# Patient Record
Sex: Male | Born: 1950 | Race: Black or African American | Hispanic: No | Smoking: Never smoker
Health system: Southern US, Community
[De-identification: ages and names within clinical notes are randomized; demographics above are authoritative.]

## PROBLEM LIST (undated history)

## (undated) DIAGNOSIS — I491 Atrial premature depolarization: Secondary | ICD-10-CM

## (undated) DIAGNOSIS — I5032 Chronic diastolic (congestive) heart failure: Secondary | ICD-10-CM

## (undated) DIAGNOSIS — I493 Ventricular premature depolarization: Secondary | ICD-10-CM

## (undated) DIAGNOSIS — I10 Essential (primary) hypertension: Secondary | ICD-10-CM

## (undated) DIAGNOSIS — I4729 Other ventricular tachycardia: Secondary | ICD-10-CM

## (undated) DIAGNOSIS — I471 Supraventricular tachycardia, unspecified: Secondary | ICD-10-CM

## (undated) DIAGNOSIS — I472 Ventricular tachycardia: Secondary | ICD-10-CM

## (undated) DIAGNOSIS — I169 Hypertensive crisis, unspecified: Secondary | ICD-10-CM

## (undated) DIAGNOSIS — J9610 Chronic respiratory failure, unspecified whether with hypoxia or hypercapnia: Secondary | ICD-10-CM

## (undated) DIAGNOSIS — R7989 Other specified abnormal findings of blood chemistry: Secondary | ICD-10-CM

## (undated) DIAGNOSIS — R778 Other specified abnormalities of plasma proteins: Secondary | ICD-10-CM

## (undated) DIAGNOSIS — E785 Hyperlipidemia, unspecified: Secondary | ICD-10-CM

## (undated) DIAGNOSIS — I422 Other hypertrophic cardiomyopathy: Secondary | ICD-10-CM

## (undated) DIAGNOSIS — H409 Unspecified glaucoma: Secondary | ICD-10-CM

## (undated) DIAGNOSIS — K5790 Diverticulosis of intestine, part unspecified, without perforation or abscess without bleeding: Secondary | ICD-10-CM

## (undated) DIAGNOSIS — R4182 Altered mental status, unspecified: Secondary | ICD-10-CM

## (undated) DIAGNOSIS — C20 Malignant neoplasm of rectum: Secondary | ICD-10-CM

## (undated) DIAGNOSIS — N183 Chronic kidney disease, stage 3 unspecified: Secondary | ICD-10-CM

## (undated) DIAGNOSIS — D649 Anemia, unspecified: Secondary | ICD-10-CM

## (undated) DIAGNOSIS — A048 Other specified bacterial intestinal infections: Secondary | ICD-10-CM

## (undated) DIAGNOSIS — G4733 Obstructive sleep apnea (adult) (pediatric): Secondary | ICD-10-CM

## (undated) HISTORY — DX: Diverticulosis of intestine, part unspecified, without perforation or abscess without bleeding: K57.90

## (undated) HISTORY — DX: Altered mental status, unspecified: R41.82

## (undated) HISTORY — DX: Other ventricular tachycardia: I47.29

## (undated) HISTORY — DX: Other specified bacterial intestinal infections: A04.8

## (undated) HISTORY — DX: Ventricular premature depolarization: I49.3

## (undated) HISTORY — DX: Chronic kidney disease, stage 3 (moderate): N18.3

## (undated) HISTORY — DX: Ventricular tachycardia: I47.2

## (undated) HISTORY — DX: Other hypertrophic cardiomyopathy: I42.2

## (undated) HISTORY — DX: Atrial premature depolarization: I49.1

## (undated) HISTORY — DX: Supraventricular tachycardia, unspecified: I47.10

## (undated) HISTORY — DX: Malignant neoplasm of rectum: C20

## (undated) HISTORY — DX: Unspecified glaucoma: H40.9

## (undated) HISTORY — PX: NO PAST SURGERIES: SHX2092

## (undated) HISTORY — DX: Hypertensive crisis, unspecified: I16.9

## (undated) HISTORY — DX: Obstructive sleep apnea (adult) (pediatric): G47.33

## (undated) HISTORY — DX: Chronic kidney disease, stage 3 unspecified: N18.30

## (undated) HISTORY — DX: Hyperlipidemia, unspecified: E78.5

## (undated) HISTORY — DX: Supraventricular tachycardia: I47.1

## (undated) HISTORY — DX: Essential (primary) hypertension: I10

## (undated) HISTORY — DX: Anemia, unspecified: D64.9

## (undated) HISTORY — DX: Chronic respiratory failure, unspecified whether with hypoxia or hypercapnia: J96.10

## (undated) NOTE — *Deleted (*Deleted)
Transition of Care [facility]) - CM/SW Discharge Note   Patient Details  Name: Robert Chaney MRN: [id number] Date of Birth: 20-Jul-1950  [facility]) CM/SW Contact:  Sharin Mons, RN Phone Number: 05/13/2020, 11:54 AM   Clinical Narrative:    Patient will DC to: home Anticipated DC date: 05/13/2020 Family notified: yes Transport by: car   Per MD patient ready for DC today . RN, patient and patient's family notified of DC. Discharge    RNCM will sign off for now as intervention is no longer needed. Please consult [place] again if new needs arise.    Final next level of care: Home/Self Care Barriers to Discharge: No Barriers Identified   Patient Goals and CMS Choice Patient states their goals for this hospitalization and ongoing recovery are:: to return home   Choice offered to / list presented to : NA  Discharge Placement                       Discharge Plan and Services In-house Referral: NA Discharge Planning Services: CM Consult Post Acute Care Choice: NA          DME Arranged: Oxygen (portable oxygen tank for transport to home) DME Agency: [employer] Date DME Agency Contacted: 05/13/20 Time DME Agency Contacted: 2000 Representative spoke with at DME Agency: [place]: RN [place] Agency: [place] Date [place]: 05/13/20 Time [place]: [place] Representative spoke with at [place]: [place] ([place]) Interventions     Readmission Risk Interventions Readmission Risk Prevention Plan 05/12/2020  Transportation Screening Complete  PCP or Specialist Appt within 3-5 Days Complete  HRI or [place] Complete  Social Work Consult for [place] Planning/Counseling [place] Screening Not Applicable  Medication Review Press photographer) Complete  Some recent data might be hidden

---

## 2009-04-16 ENCOUNTER — Ambulatory Visit: Payer: Self-pay | Admitting: Cardiology

## 2009-04-16 ENCOUNTER — Inpatient Hospital Stay (HOSPITAL_COMMUNITY): Admission: EM | Admit: 2009-04-16 | Discharge: 2009-04-20 | Payer: Self-pay | Admitting: Emergency Medicine

## 2009-04-17 ENCOUNTER — Encounter (INDEPENDENT_AMBULATORY_CARE_PROVIDER_SITE_OTHER): Payer: Self-pay | Admitting: Cardiology

## 2010-03-22 ENCOUNTER — Ambulatory Visit: Payer: Self-pay | Admitting: Cardiology

## 2010-07-24 ENCOUNTER — Ambulatory Visit: Payer: Self-pay | Admitting: Cardiology

## 2010-09-27 LAB — CBC
HCT: 28.5 % — ABNORMAL LOW (ref 39.0–52.0)
HCT: 30.2 % — ABNORMAL LOW (ref 39.0–52.0)
HCT: 31.1 % — ABNORMAL LOW (ref 39.0–52.0)
Hemoglobin: 10.3 g/dL — ABNORMAL LOW (ref 13.0–17.0)
Hemoglobin: 9.9 g/dL — ABNORMAL LOW (ref 13.0–17.0)
MCHC: 33.7 g/dL (ref 30.0–36.0)
MCHC: 33.8 g/dL (ref 30.0–36.0)
MCHC: 34.1 g/dL (ref 30.0–36.0)
MCHC: 34.7 g/dL (ref 30.0–36.0)
MCV: 95.1 fL (ref 78.0–100.0)
MCV: 95.8 fL (ref 78.0–100.0)
MCV: 95.8 fL (ref 78.0–100.0)
MCV: 95.9 fL (ref 78.0–100.0)
Platelets: 294 10*3/uL (ref 150–400)
RBC: 3 MIL/uL — ABNORMAL LOW (ref 4.22–5.81)
RBC: 3.15 MIL/uL — ABNORMAL LOW (ref 4.22–5.81)
RBC: 3.34 MIL/uL — ABNORMAL LOW (ref 4.22–5.81)
RDW: 14.4 % (ref 11.5–15.5)
WBC: 10.8 10*3/uL — ABNORMAL HIGH (ref 4.0–10.5)
WBC: 7.8 10*3/uL (ref 4.0–10.5)

## 2010-09-27 LAB — COMPREHENSIVE METABOLIC PANEL
BUN: 14 mg/dL (ref 6–23)
CO2: 26 mEq/L (ref 19–32)
Calcium: 8.2 mg/dL — ABNORMAL LOW (ref 8.4–10.5)
Chloride: 105 mEq/L (ref 96–112)
Creatinine, Ser: 1.2 mg/dL (ref 0.4–1.5)
GFR calc Af Amer: >60 mL/min (ref >60)
Glucose, Bld: 130 mg/dL — ABNORMAL HIGH (ref 70–99)
Potassium: 3.9 mEq/L (ref 3.5–5.1)
Sodium: 136 mEq/L (ref 135–145)
Total Protein: 6.8 g/dL (ref 6.0–8.3)

## 2010-09-27 LAB — CARDIAC PANEL(CRET KIN+CKTOT+MB+TROPI)
CK, MB: 4.2 ng/mL — ABNORMAL HIGH (ref 0.3–4.0)
Relative Index: 2.9 — ABNORMAL HIGH (ref 0.0–2.5)
Relative Index: 3.4 — ABNORMAL HIGH (ref 0.0–2.5)
Total CK: 126 U/L (ref 7–232)

## 2010-09-27 LAB — POCT I-STAT, CHEM 8
BUN: 16 mg/dL (ref 6–23)
Calcium, Ion: 1.07 mmol/L — ABNORMAL LOW (ref 1.12–1.32)
Potassium: 4 mEq/L (ref 3.5–5.1)
TCO2: 27 mmol/L (ref 0–100)

## 2010-09-27 LAB — DIFFERENTIAL
Basophils Absolute: 0 10*3/uL (ref 0.0–0.1)
Lymphs Abs: 0.9 10*3/uL (ref 0.7–4.0)
Neutro Abs: 9.5 10*3/uL — ABNORMAL HIGH (ref 1.7–7.7)
Neutrophils Relative %: 88 % — ABNORMAL HIGH (ref 43–77)

## 2010-09-27 LAB — GLUCOSE, CAPILLARY
Glucose-Capillary: 125 mg/dL — ABNORMAL HIGH (ref 70–99)
Glucose-Capillary: 128 mg/dL — ABNORMAL HIGH (ref 70–99)
Glucose-Capillary: 133 mg/dL — ABNORMAL HIGH (ref 70–99)
Glucose-Capillary: 138 mg/dL — ABNORMAL HIGH (ref 70–99)

## 2010-09-27 LAB — BASIC METABOLIC PANEL
BUN: 14 mg/dL (ref 6–23)
BUN: 20 mg/dL (ref 6–23)
CO2: 28 mEq/L (ref 19–32)
CO2: 28 mEq/L (ref 19–32)
CO2: 29 mEq/L (ref 19–32)
Calcium: 8.4 mg/dL (ref 8.4–10.5)
Calcium: 8.5 mg/dL (ref 8.4–10.5)
Calcium: 8.6 mg/dL (ref 8.4–10.5)
Chloride: 102 mEq/L (ref 96–112)
Chloride: 96 mEq/L (ref 96–112)
Creatinine, Ser: 1.51 mg/dL — ABNORMAL HIGH (ref 0.4–1.5)
GFR calc Af Amer: 53 mL/min — ABNORMAL LOW (ref >60)
GFR calc Af Amer: 57 mL/min — ABNORMAL LOW (ref >60)
GFR calc Af Amer: >60 mL/min (ref >60)
GFR calc non Af Amer: 56 mL/min — ABNORMAL LOW (ref >60)
Glucose, Bld: 129 mg/dL — ABNORMAL HIGH (ref 70–99)
Glucose, Bld: 138 mg/dL — ABNORMAL HIGH (ref 70–99)
Potassium: 3.7 mEq/L (ref 3.5–5.1)
Potassium: 3.9 mEq/L (ref 3.5–5.1)
Potassium: 4 mEq/L (ref 3.5–5.1)
Sodium: 137 mEq/L (ref 135–145)
Sodium: 138 mEq/L (ref 135–145)

## 2010-09-27 LAB — LIPID PANEL
HDL: 41 mg/dL (ref >39)
LDL Cholesterol: 124 mg/dL — ABNORMAL HIGH (ref 0–99)

## 2010-09-27 LAB — URINALYSIS, ROUTINE W REFLEX MICROSCOPIC
Leukocytes, UA: NEGATIVE
Nitrite: NEGATIVE
Protein, ur: 100 mg/dL — AB
pH: 7.5 (ref 5.0–8.0)

## 2010-09-27 LAB — IRON AND TIBC
Iron: 40 ug/dL — ABNORMAL LOW (ref 42–135)
Saturation Ratios: 14 % — ABNORMAL LOW (ref 20–55)
UIBC: 255 ug/dL

## 2010-09-27 LAB — PROTIME-INR
INR: 1.02 (ref 0.00–1.49)
Prothrombin Time: 13.3 seconds (ref 11.6–15.2)

## 2010-09-27 LAB — BRAIN NATRIURETIC PEPTIDE: Pro B Natriuretic peptide (BNP): 510 pg/mL — ABNORMAL HIGH (ref 0.0–100.0)

## 2010-09-27 LAB — RAPID URINE DRUG SCREEN, HOSP PERFORMED
Benzodiazepines: NOT DETECTED
Opiates: NOT DETECTED
Tetrahydrocannabinol: NOT DETECTED

## 2010-09-27 LAB — VITAMIN B12: Vitamin B-12: 389 pg/mL (ref 211–911)

## 2010-09-27 LAB — URINE MICROSCOPIC-ADD ON

## 2010-09-27 LAB — CK TOTAL AND CKMB (NOT AT ARMC): Relative Index: 3.5 — ABNORMAL HIGH (ref 0.0–2.5)

## 2010-09-27 LAB — HEMOGLOBIN A1C
Hgb A1c MFr Bld: 6.1 % (ref 4.6–6.1)
Mean Plasma Glucose: 128 mg/dL

## 2010-09-27 LAB — POCT CARDIAC MARKERS
CKMB, poc: 2.7 ng/mL (ref 1.0–8.0)
Troponin i, poc: 0.09 ng/mL (ref 0.00–0.09)

## 2010-09-27 LAB — APTT: aPTT: 29 seconds (ref 24–37)

## 2011-02-14 ENCOUNTER — Encounter: Payer: Self-pay | Admitting: Cardiology

## 2011-02-20 ENCOUNTER — Ambulatory Visit (INDEPENDENT_AMBULATORY_CARE_PROVIDER_SITE_OTHER): Payer: 59 | Admitting: Cardiology

## 2011-02-20 ENCOUNTER — Encounter: Payer: Self-pay | Admitting: Cardiology

## 2011-02-20 ENCOUNTER — Other Ambulatory Visit: Payer: Self-pay | Admitting: *Deleted

## 2011-02-20 VITALS — BP 150/90 | HR 80 | Wt 198.0 lb

## 2011-02-20 DIAGNOSIS — E7800 Pure hypercholesterolemia, unspecified: Secondary | ICD-10-CM | POA: Insufficient documentation

## 2011-02-20 DIAGNOSIS — I10 Essential (primary) hypertension: Secondary | ICD-10-CM

## 2011-02-20 DIAGNOSIS — I119 Hypertensive heart disease without heart failure: Secondary | ICD-10-CM | POA: Insufficient documentation

## 2011-02-20 MED ORDER — LISINOPRIL 20 MG PO TABS: 20.0000 mg | ORAL_TABLET | Freq: Two times a day (BID) | ORAL | Status: DC

## 2011-02-20 MED ORDER — FUROSEMIDE 40 MG PO TABS: 40.0000 mg | ORAL_TABLET | Freq: Every day | ORAL | Status: DC

## 2011-02-20 MED ORDER — AMLODIPINE BESYLATE 5 MG PO TABS: 5.0000 mg | ORAL_TABLET | Freq: Every day | ORAL | Status: DC

## 2011-02-20 MED ORDER — ISOSORBIDE MONONITRATE ER 60 MG PO TB24: 60.0000 mg | ORAL_TABLET | Freq: Every day | ORAL | Status: DC

## 2011-02-20 MED ORDER — SIMVASTATIN 40 MG PO TABS: 40.0000 mg | ORAL_TABLET | Freq: Every day | ORAL | Status: DC

## 2011-02-20 MED ORDER — METOPROLOL SUCCINATE ER 100 MG PO TB24: 100.0000 mg | ORAL_TABLET | Freq: Two times a day (BID) | ORAL | Status: DC

## 2011-02-20 NOTE — Progress Notes (Signed)
Robert Chaney Date of Birth:  1950-12-15 [facility] Cardiology / [facility] [id number] [place], [place]  [address] [phone]           Fax   [phone]  HPI: This pleasant 60 year old gentleman is seen for a scheduled six-month followup office visit.  He is a nursing home 78 at the [facility].  He was born in [place].  We met him in October 2010 when he was admitted with acute congestive heart failure secondary to high blood pressure with diastolic dysfunction.  Patient also has a history of hypercholesterolemia and diabetes.  Since last visit he's been feeling well his last echocardiogram on 11/23/09 showed normal systolic function with an ejection fraction of 60-65% with marked LVH and hyperdynamic left ventricular systolic function and impaired relaxation.  He had normal pulmonary artery pressure and no significant valve problems.  He did have marked concentric LVH secondary to long-standing previous hypertension  Current Outpatient Prescriptions  Medication Sig Dispense Refill  . acetaminophen (TYLENOL) 325 MG tablet Take 650 mg by mouth as needed.        Marland Kitchen lisinopril (PRINIVIL,ZESTRIL) 20 MG tablet Take 1 tablet (20 mg total) by mouth 2 (two) times daily.  60 tablet  11  . nitroGLYCERIN (NITROSTAT) 0.4 MG SL tablet Place 0.4 mg under the tongue as needed.        Marland Kitchen amLODipine (NORVASC) 5 MG tablet Take 1 tablet (5 mg total) by mouth daily.  30 tablet  11  . furosemide (LASIX) 40 MG tablet Take 1 tablet (40 mg total) by mouth daily. Taking 20mg . daily  30 tablet  11  . isosorbide mononitrate (IMDUR) 60 MG 24 hr tablet Take 1 tablet (60 mg total) by mouth daily.  30 tablet  11  . metoprolol (TOPROL-XL) 100 MG 24 hr tablet Take 1 tablet (100 mg total) by mouth 2 (two) times daily.  60 tablet  11  . simvastatin (ZOCOR) 40 MG tablet Take 1 tablet (40 mg total) by mouth daily.  30 tablet  11    Allergies  Allergen Reactions  . Aspirin    itching    There is no problem list on file for this patient.   History  Smoking status  . Never Smoker   Smokeless tobacco  . Not on file    History  Alcohol Use No    No family history on file.  Review of Systems: The patient denies any heat or cold intolerance.  No weight gain or weight loss.  The patient denies headaches or blurry vision.  There is no cough or sputum production.  The patient denies dizziness.  There is no hematuria or hematochezia.  The patient denies any muscle aches or arthritis.  The patient denies any rash.  The patient denies frequent falling or instability.  There is no history of depression or anxiety.  All other systems were reviewed and are negative.   Physical Exam: Filed Vitals:   02/20/11 1519  BP: 150/90  Pulse: 80  The general appearance reveals a well-developed well-nourished gentleman in no distress.Pupils equal and reactive.   Extraocular Movements are full.  There is no scleral icterus.  The mouth and pharynx are normal.  The neck is supple.  The carotids reveal no bruits.  The jugular venous pressure is normal.  The thyroid is not enlarged.  There is no lymphadenopathy.  The chest is clear to percussion and auscultation. There are no  rales or rhonchi. Expansion of the chest is symmetrical.  The precordium is quiet.  The first heart sound is normal.  The second heart sound is physiologically split.  There is no murmur gallop rub or click.  There is no abnormal lift or heave. The abdomen is soft and nontender. Bowel sounds are normal. The liver and spleen are not enlarged. There Are no abdominal masses. There are no bruits.  The pedal pulses are good.  There is no phlebitis or edema.  There is no cyanosis or clubbing.  Strength is normal and symmetrical in all extremities.  There is no lateralizing weakness.  There are no sensory deficits.      Assessment / Plan: His blood pressure is still not ideal.  Previous lab work had shown some  prerenal azotemia.  He had been on both furosemide and hydrochlorothiazide.  At this point we will stop the hydrochlorothiazide and increase his lisinopril to 20 mg twice a day.  We will plan to see him again in 6 months for followup office visit with fasting lipid panel hepatic function panel and basal metabolic panel.

## 2011-02-20 NOTE — Telephone Encounter (Signed)
Refilled all meds

## 2011-02-20 NOTE — Assessment & Plan Note (Signed)
Patient has a history of hypercholesterolemia.  He is on simvastatin.  He's not having any side effects.

## 2011-02-20 NOTE — Assessment & Plan Note (Signed)
The patient denies any recent problems with dyspnea or chest pain or dizziness or syncope.  He's not been aware of any arrhythmias.  His energy level remains satisfactory.

## 2011-04-11 ENCOUNTER — Other Ambulatory Visit: Payer: Self-pay | Admitting: Cardiology

## 2011-04-12 NOTE — Telephone Encounter (Signed)
Refilled lopressor and hctz

## 2011-07-19 ENCOUNTER — Other Ambulatory Visit: Payer: Self-pay

## 2011-07-19 ENCOUNTER — Emergency Department (HOSPITAL_COMMUNITY): Payer: 59

## 2011-07-19 ENCOUNTER — Inpatient Hospital Stay (HOSPITAL_COMMUNITY)
Admission: RE | Admit: 2011-07-19 | Discharge: 2011-07-24 | DRG: 638 | Disposition: A | Discharge status: Home / Self Care | Payer: 59 | Attending: Internal Medicine | Admitting: Internal Medicine

## 2011-07-19 ENCOUNTER — Encounter (HOSPITAL_COMMUNITY): Payer: Self-pay | Admitting: *Deleted

## 2011-07-19 DIAGNOSIS — N179 Acute kidney failure, unspecified: Secondary | ICD-10-CM | POA: Diagnosis present

## 2011-07-19 DIAGNOSIS — I517 Cardiomegaly: Secondary | ICD-10-CM

## 2011-07-19 DIAGNOSIS — N35919 Unspecified urethral stricture, male, unspecified site: Secondary | ICD-10-CM | POA: Diagnosis present

## 2011-07-19 DIAGNOSIS — Z7902 Long term (current) use of antithrombotics/antiplatelets: Secondary | ICD-10-CM

## 2011-07-19 DIAGNOSIS — I359 Nonrheumatic aortic valve disorder, unspecified: Secondary | ICD-10-CM | POA: Diagnosis present

## 2011-07-19 DIAGNOSIS — R531 Weakness: Secondary | ICD-10-CM

## 2011-07-19 DIAGNOSIS — N184 Chronic kidney disease, stage 4 (severe): Secondary | ICD-10-CM | POA: Diagnosis present

## 2011-07-19 DIAGNOSIS — I129 Hypertensive chronic kidney disease with stage 1 through stage 4 chronic kidney disease, or unspecified chronic kidney disease: Secondary | ICD-10-CM | POA: Diagnosis present

## 2011-07-19 DIAGNOSIS — I379 Nonrheumatic pulmonary valve disorder, unspecified: Secondary | ICD-10-CM

## 2011-07-19 DIAGNOSIS — I2489 Other forms of acute ischemic heart disease: Secondary | ICD-10-CM | POA: Diagnosis present

## 2011-07-19 DIAGNOSIS — R7989 Other specified abnormal findings of blood chemistry: Secondary | ICD-10-CM

## 2011-07-19 DIAGNOSIS — E875 Hyperkalemia: Secondary | ICD-10-CM | POA: Diagnosis present

## 2011-07-19 DIAGNOSIS — E860 Dehydration: Secondary | ICD-10-CM

## 2011-07-19 DIAGNOSIS — D649 Anemia, unspecified: Secondary | ICD-10-CM | POA: Diagnosis present

## 2011-07-19 DIAGNOSIS — E1100 Type 2 diabetes mellitus with hyperosmolarity without nonketotic hyperglycemic-hyperosmolar coma (NKHHC): Secondary | ICD-10-CM | POA: Diagnosis present

## 2011-07-19 DIAGNOSIS — Z79899 Other long term (current) drug therapy: Secondary | ICD-10-CM

## 2011-07-19 DIAGNOSIS — Z886 Allergy status to analgesic agent status: Secondary | ICD-10-CM

## 2011-07-19 DIAGNOSIS — Z23 Encounter for immunization: Secondary | ICD-10-CM

## 2011-07-19 DIAGNOSIS — D696 Thrombocytopenia, unspecified: Secondary | ICD-10-CM | POA: Diagnosis present

## 2011-07-19 DIAGNOSIS — I10 Essential (primary) hypertension: Secondary | ICD-10-CM

## 2011-07-19 DIAGNOSIS — N19 Unspecified kidney failure: Secondary | ICD-10-CM

## 2011-07-19 DIAGNOSIS — M6282 Rhabdomyolysis: Secondary | ICD-10-CM | POA: Diagnosis present

## 2011-07-19 DIAGNOSIS — E869 Volume depletion, unspecified: Secondary | ICD-10-CM | POA: Diagnosis present

## 2011-07-19 DIAGNOSIS — I248 Other forms of acute ischemic heart disease: Secondary | ICD-10-CM | POA: Diagnosis present

## 2011-07-19 DIAGNOSIS — R739 Hyperglycemia, unspecified: Secondary | ICD-10-CM

## 2011-07-19 DIAGNOSIS — E7800 Pure hypercholesterolemia, unspecified: Secondary | ICD-10-CM

## 2011-07-19 DIAGNOSIS — R079 Chest pain, unspecified: Secondary | ICD-10-CM

## 2011-07-19 DIAGNOSIS — I5033 Acute on chronic diastolic (congestive) heart failure: Secondary | ICD-10-CM | POA: Diagnosis present

## 2011-07-19 DIAGNOSIS — E870 Hyperosmolality and hypernatremia: Secondary | ICD-10-CM | POA: Diagnosis present

## 2011-07-19 DIAGNOSIS — I509 Heart failure, unspecified: Secondary | ICD-10-CM | POA: Diagnosis present

## 2011-07-19 DIAGNOSIS — N189 Chronic kidney disease, unspecified: Secondary | ICD-10-CM | POA: Diagnosis present

## 2011-07-19 DIAGNOSIS — R778 Other specified abnormalities of plasma proteins: Secondary | ICD-10-CM | POA: Diagnosis present

## 2011-07-19 DIAGNOSIS — E118 Type 2 diabetes mellitus with unspecified complications: Secondary | ICD-10-CM | POA: Diagnosis present

## 2011-07-19 DIAGNOSIS — I5032 Chronic diastolic (congestive) heart failure: Secondary | ICD-10-CM | POA: Diagnosis present

## 2011-07-19 DIAGNOSIS — R9431 Abnormal electrocardiogram [ECG] [EKG]: Secondary | ICD-10-CM | POA: Diagnosis present

## 2011-07-19 DIAGNOSIS — Z794 Long term (current) use of insulin: Secondary | ICD-10-CM

## 2011-07-19 DIAGNOSIS — R748 Abnormal levels of other serum enzymes: Secondary | ICD-10-CM | POA: Diagnosis present

## 2011-07-19 HISTORY — DX: Chronic diastolic (congestive) heart failure: I50.32

## 2011-07-19 LAB — URINALYSIS, ROUTINE W REFLEX MICROSCOPIC
Glucose, UA: >1000 mg/dL — AB
Ketones, ur: NEGATIVE mg/dL
Nitrite: NEGATIVE
Specific Gravity, Urine: 1.025 (ref 1.005–1.030)
pH: 5 (ref 5.0–8.0)

## 2011-07-19 LAB — GLUCOSE, CAPILLARY
Glucose-Capillary: >600 mg/dL (ref 70–99)
Glucose-Capillary: >600 mg/dL (ref 70–99)
Glucose-Capillary: >600 mg/dL (ref 70–99)
Glucose-Capillary: >600 mg/dL (ref 70–99)
Glucose-Capillary: >600 mg/dL (ref 70–99)
Glucose-Capillary: 506 mg/dL — ABNORMAL HIGH (ref 70–99)
Glucose-Capillary: 362 mg/dL — ABNORMAL HIGH (ref 70–99)
Glucose-Capillary: 316 mg/dL — ABNORMAL HIGH (ref 70–99)
Glucose-Capillary: 249 mg/dL — ABNORMAL HIGH (ref 70–99)
Glucose-Capillary: 212 mg/dL — ABNORMAL HIGH (ref 70–99)
Glucose-Capillary: 211 mg/dL — ABNORMAL HIGH (ref 70–99)
Glucose-Capillary: 158 mg/dL — ABNORMAL HIGH (ref 70–99)

## 2011-07-19 LAB — BASIC METABOLIC PANEL
CO2: 25 mEq/L (ref 19–32)
Calcium: 9.2 mg/dL (ref 8.4–10.5)
Calcium: 9.3 mg/dL (ref 8.4–10.5)
Creatinine, Ser: 4.72 mg/dL — ABNORMAL HIGH (ref 0.50–1.35)
Creatinine, Ser: 4.71 mg/dL — ABNORMAL HIGH (ref 0.50–1.35)
GFR calc Af Amer: 14 mL/min — ABNORMAL LOW (ref >90)
GFR calc non Af Amer: 12 mL/min — ABNORMAL LOW (ref >90)
Sodium: 154 mEq/L — ABNORMAL HIGH (ref 135–145)

## 2011-07-19 LAB — POCT I-STAT, CHEM 8
Creatinine, Ser: 4.6 mg/dL — ABNORMAL HIGH (ref 0.50–1.35)
Glucose, Bld: >700 mg/dL (ref 70–99)
Hemoglobin: 17 g/dL (ref 13.0–17.0)
TCO2: 21 mmol/L (ref 0–100)

## 2011-07-19 LAB — CARDIAC PANEL(CRET KIN+CKTOT+MB+TROPI)
CK, MB: 12.5 ng/mL (ref 0.3–4.0)
CK, MB: 12.2 ng/mL (ref 0.3–4.0)
Relative Index: 0.8 (ref 0.0–2.5)
Total CK: 1510 U/L — ABNORMAL HIGH (ref 7–232)
Total CK: 2523 U/L — ABNORMAL HIGH (ref 7–232)
Troponin I: 0.35 ng/mL (ref <0.30)
Troponin I: 1.2 ng/mL (ref <0.30)

## 2011-07-19 LAB — PRO B NATRIURETIC PEPTIDE: Pro B Natriuretic peptide (BNP): 1915 pg/mL — ABNORMAL HIGH (ref 0–125)

## 2011-07-19 LAB — CBC
MCH: 32.3 pg (ref 26.0–34.0)
MCHC: 34.5 g/dL (ref 30.0–36.0)
MCV: 99.6 fL (ref 78.0–100.0)
MCV: 93.8 fL (ref 78.0–100.0)
Platelets: 254 10*3/uL (ref 150–400)
Platelets: 226 10*3/uL (ref 150–400)
RBC: 4.76 MIL/uL (ref 4.22–5.81)
RDW: 12.6 % (ref 11.5–15.5)
WBC: 10 10*3/uL (ref 4.0–10.5)

## 2011-07-19 LAB — HEMOGLOBIN A1C
Hgb A1c MFr Bld: 12 % — ABNORMAL HIGH (ref <5.7)
Mean Plasma Glucose: 298 mg/dL — ABNORMAL HIGH (ref <117)

## 2011-07-19 LAB — BLOOD GAS, ARTERIAL
Drawn by: 340271
FIO2: 0.21 %
pCO2 arterial: 43.7 mmHg (ref 35.0–45.0)
pH, Arterial: 7.259 — ABNORMAL LOW (ref 7.350–7.450)
pO2, Arterial: 77.4 mmHg — ABNORMAL LOW (ref 80.0–100.0)

## 2011-07-19 LAB — POCT I-STAT TROPONIN I: Troponin i, poc: 0.26 ng/mL (ref 0.00–0.08)

## 2011-07-19 LAB — URINE MICROSCOPIC-ADD ON

## 2011-07-19 LAB — MRSA PCR SCREENING: MRSA by PCR: NEGATIVE

## 2011-07-19 MED ORDER — INSULIN REGULAR HUMAN 100 UNIT/ML IJ SOLN
INTRAMUSCULAR | Status: DC
  Administered 2011-07-19: 7.8 [IU]/h via INTRAVENOUS
  Filled 2011-07-19 (×3): qty 1

## 2011-07-19 MED ORDER — INSULIN GLARGINE 100 UNIT/ML ~~LOC~~ SOLN
15.0000 [IU] | Freq: Every day | SUBCUTANEOUS | Status: AC
  Administered 2011-07-19: 15 [IU] via SUBCUTANEOUS
  Filled 2011-07-19: qty 3

## 2011-07-19 MED ORDER — INSULIN ASPART 100 UNIT/ML ~~LOC~~ SOLN: 0.0000 [IU] | Freq: Every day | SUBCUTANEOUS | Status: DC

## 2011-07-19 MED ORDER — ENOXAPARIN SODIUM 30 MG/0.3ML ~~LOC~~ SOLN
30.0000 mg | SUBCUTANEOUS | Status: DC
  Administered 2011-07-19 – 2011-07-22 (×4): 30 mg via SUBCUTANEOUS
  Filled 2011-07-19 (×5): qty 0.3

## 2011-07-19 MED ORDER — DEXTROSE 5 % IV SOLN
INTRAVENOUS | Status: AC
  Administered 2011-07-19: via INTRAVENOUS

## 2011-07-19 MED ORDER — METOPROLOL TARTRATE 50 MG PO TABS
50.0000 mg | ORAL_TABLET | Freq: Two times a day (BID) | ORAL | Status: DC
  Administered 2011-07-19 – 2011-07-20 (×2): 50 mg via ORAL
  Filled 2011-07-19 (×4): qty 1

## 2011-07-19 MED ORDER — SODIUM CHLORIDE 0.9 % IV SOLN
999.0000 mL | Freq: Once | INTRAVENOUS | Status: AC
  Administered 2011-07-19: 999 mL via INTRAVENOUS

## 2011-07-19 MED ORDER — DEXTROSE 50 % IV SOLN: 25.0000 mL | INTRAVENOUS | Status: DC | PRN

## 2011-07-19 MED ORDER — DEXTROSE-NACL 5-0.45 % IV SOLN
INTRAVENOUS | Status: DC
  Administered 2011-07-19: 20:00:00 via INTRAVENOUS

## 2011-07-19 MED ORDER — INFLUENZA VIRUS VACC SPLIT PF IM SUSP
0.5000 mL | INTRAMUSCULAR | Status: DC
  Filled 2011-07-19: qty 0.5

## 2011-07-19 MED ORDER — PNEUMOCOCCAL VAC POLYVALENT 25 MCG/0.5ML IJ INJ
0.5000 mL | INJECTION | INTRAMUSCULAR | Status: DC
  Filled 2011-07-19: qty 0.5

## 2011-07-19 MED ORDER — SODIUM CHLORIDE 0.9 % IV SOLN
INTRAVENOUS | Status: DC
  Administered 2011-07-19: 6.4 [IU]/h via INTRAVENOUS
  Filled 2011-07-19: qty 1

## 2011-07-19 MED ORDER — ONDANSETRON HCL 4 MG PO TABS: 4.0000 mg | ORAL_TABLET | Freq: Four times a day (QID) | ORAL | Status: DC | PRN

## 2011-07-19 MED ORDER — ONDANSETRON HCL 4 MG/2ML IJ SOLN: 4.0000 mg | Freq: Four times a day (QID) | INTRAMUSCULAR | Status: DC | PRN

## 2011-07-19 MED ORDER — ISOSORBIDE MONONITRATE ER 60 MG PO TB24
60.0000 mg | ORAL_TABLET | Freq: Every day | ORAL | Status: DC
  Administered 2011-07-19 – 2011-07-24 (×6): 60 mg via ORAL
  Filled 2011-07-19 (×7): qty 1

## 2011-07-19 MED ORDER — ACETAMINOPHEN 325 MG PO TABS: 650.0000 mg | ORAL_TABLET | Freq: Four times a day (QID) | ORAL | Status: DC | PRN

## 2011-07-19 MED ORDER — CLOPIDOGREL BISULFATE 75 MG PO TABS
75.0000 mg | ORAL_TABLET | Freq: Every day | ORAL | Status: DC
  Administered 2011-07-20 – 2011-07-24 (×5): 75 mg via ORAL
  Filled 2011-07-19 (×7): qty 1

## 2011-07-19 MED ORDER — SODIUM CHLORIDE 0.9 % IV SOLN
INTRAVENOUS | Status: DC
  Administered 2011-07-19: 19:00:00 via INTRAVENOUS

## 2011-07-19 MED ORDER — INSULIN REGULAR BOLUS VIA INFUSION
0.0000 [IU] | Freq: Three times a day (TID) | INTRAVENOUS | Status: DC
  Filled 2011-07-19 (×2): qty 10

## 2011-07-19 MED ORDER — SODIUM POLYSTYRENE SULFONATE 15 GM/60ML PO SUSP
30.0000 g | Freq: Once | ORAL | Status: AC
  Administered 2011-07-19: 30 g via ORAL
  Filled 2011-07-19: qty 120

## 2011-07-19 MED ORDER — SODIUM CHLORIDE 0.9 % IV SOLN: Freq: Once | INTRAVENOUS | Status: DC

## 2011-07-19 MED ORDER — SODIUM CHLORIDE 0.9 % IV BOLUS (SEPSIS)
500.0000 mL | INTRAVENOUS | Status: AC
  Administered 2011-07-19: 500 mL via INTRAVENOUS

## 2011-07-19 MED ORDER — SODIUM CHLORIDE 0.9 % IV SOLN
1.0000 g | Freq: Once | INTRAVENOUS | Status: AC
  Administered 2011-07-19: 1 g via INTRAVENOUS
  Filled 2011-07-19 (×2): qty 10

## 2011-07-19 MED ORDER — POTASSIUM CHLORIDE CRYS ER 20 MEQ PO TBCR
EXTENDED_RELEASE_TABLET | ORAL | Status: AC
  Filled 2011-07-19: qty 2

## 2011-07-19 MED ORDER — SIMVASTATIN 40 MG PO TABS
40.0000 mg | ORAL_TABLET | Freq: Every day | ORAL | Status: DC
  Filled 2011-07-19: qty 1

## 2011-07-19 MED ORDER — INSULIN ASPART 100 UNIT/ML ~~LOC~~ SOLN
0.0000 [IU] | Freq: Three times a day (TID) | SUBCUTANEOUS | Status: DC
  Filled 2011-07-19: qty 3

## 2011-07-19 MED ORDER — NITROGLYCERIN 0.4 MG SL SUBL: 0.4000 mg | SUBLINGUAL_TABLET | SUBLINGUAL | Status: DC | PRN

## 2011-07-19 MED ORDER — POTASSIUM CHLORIDE CRYS ER 20 MEQ PO TBCR
40.0000 meq | EXTENDED_RELEASE_TABLET | Freq: Once | ORAL | Status: AC
  Administered 2011-07-19: 40 meq via ORAL

## 2011-07-19 MED ORDER — WHITE PETROLATUM GEL
Status: AC
  Administered 2011-07-19: 16:00:00
  Filled 2011-07-19: qty 5

## 2011-07-19 MED ORDER — ACETAMINOPHEN 650 MG RE SUPP: 650.0000 mg | Freq: Four times a day (QID) | RECTAL | Status: DC | PRN

## 2011-07-19 NOTE — ED Provider Notes (Addendum)
History     CSN: [id number]  Arrival date & time 07/19/11  [id number]   First MD Initiated Contact with Patient 07/19/11 0703      Chief Complaint  Patient presents with  . Headache  . Chest Pain  . Blurred Vision  . Hyperglycemia   61 year old gentleman with a known history of essential hypertension, congestive heart failure, and diastolic dysfunction, high cholesterol, anemia, and diabetes. States he's been very weak over the past few days, but mostly describes generalized weakness. He denies any focal or unilateral weakness. He initially complained to EMS of headache, shortness of breath, and chest pain, but denies this to me. Just states that he feels weak. EMS reported CBG that was high, which was confirmed in the ER. Patient does complain of excessive thirst and urination. He denies any recent fever or illness. He states he is taking his medications as prescribed. Apparently, his cardiologist is Dr. Mare Ferrari from [facility]. At this time, denies chest pain, denies shortness of breath, denies any nausea, vomiting.  Initial blood pleasure 113/63. Temperature is 98.4  (Consider location/radiation/quality/duration/timing/severity/associated sxs/prior treatment) HPI  Past Medical History  Diagnosis Date  . Hyperlipidemia     HYPERCHOLESTEROLEMIA  . Anemia   . Diabetes mellitus   . Hypertension     MARKED LEFT VENTRICULAR HYPERTROPHY BY PREVIOUS ECHOCARDIOGRAM--HE HAS HYPERDYNAMIC LEFT VENTRICULAR SYSTOLIC FUNCTION AND HAS IMPAIRED RELAXATION BY ECHO    History reviewed. No pertinent past surgical history.  No family history on file.  History  Substance Use Topics  . Smoking status: Never Smoker   . Smokeless tobacco: Not on file  . Alcohol Use: No      Review of Systems  Constitutional: Positive for fatigue. Negative for fever.  Eyes: Positive for visual disturbance.  Respiratory: Negative for chest tightness and shortness of breath.   Cardiovascular: Negative for chest  pain and palpitations.  Genitourinary: Negative for dysuria.  Neurological: Positive for weakness.  Psychiatric/Behavioral: Negative for confusion.  All other systems reviewed and are negative.    Allergies  Aspirin  Home Medications   Current Outpatient Rx  Name Route Sig Dispense Refill  . ACETAMINOPHEN 325 MG PO TABS Oral Take 650 mg by mouth as needed.      Marland Kitchen AMLODIPINE BESYLATE 5 MG PO TABS Oral Take 1 tablet (5 mg total) by mouth daily. 30 tablet 11  . FUROSEMIDE 40 MG PO TABS Oral Take 1 tablet (40 mg total) by mouth daily. Taking 20mg . daily 30 tablet 11  . HYDROCHLOROTHIAZIDE 25 MG PO TABS  TAKE ONE TABLET BY MOUTH EVERY DAY 30 tablet 11  . ISOSORBIDE MONONITRATE ER 60 MG PO TB24 Oral Take 1 tablet (60 mg total) by mouth daily. 30 tablet 11  . LISINOPRIL 20 MG PO TABS Oral Take 1 tablet (20 mg total) by mouth 2 (two) times daily. 60 tablet 11  . METOPROLOL TARTRATE 100 MG PO TABS  TAKE ONE TABLET BY MOUTH TWICE DAILY 60 tablet 11  . METOPROLOL SUCCINATE ER 100 MG PO TB24 Oral Take 1 tablet (100 mg total) by mouth 2 (two) times daily. 60 tablet 11  . NITROGLYCERIN 0.4 MG SL SUBL Sublingual Place 0.4 mg under the tongue as needed.      Marland Kitchen SIMVASTATIN 40 MG PO TABS Oral Take 1 tablet (40 mg total) by mouth daily. 30 tablet 11    BP 113/63  Pulse 81  Temp(Src) 98.4 F (36.9 C) (Oral)  Resp 20  SpO2 98%  Physical  Exam  Nursing note and vitals reviewed. Constitutional: He is oriented to person, place, and time. He appears well-developed and well-nourished.       Appears to show generalized weakness, with no acute distress. Somewhat listless  HENT:  Head: Normocephalic and atraumatic.       Mucous membranes dry  Eyes: Conjunctivae and EOM are normal. Pupils are equal, round, and reactive to light.  Neck: Neck supple.  Cardiovascular: Normal rate and regular rhythm.  Exam reveals no gallop and no friction rub.   No murmur heard. Pulmonary/Chest: Breath sounds normal. He  has no wheezes. He has no rales. He exhibits no tenderness.  Abdominal: Soft. Bowel sounds are normal. He exhibits no distension. There is no tenderness. There is no rebound and no guarding.  Musculoskeletal: Normal range of motion. He exhibits no edema and no tenderness.  Neurological: He is alert and oriented to person, place, and time. No cranial nerve deficit. Coordination normal.  Skin: Skin is warm and dry. No rash noted.  Psychiatric: He has a normal mood and affect.    ED Course  Procedures (including critical care time)  Labs Reviewed  CARDIAC PANEL(CRET KIN+CKTOT+MB+TROPI) - Abnormal; Notable for the following:    Total CK 1127 (*)    All other components within normal limits  BLOOD GAS, ARTERIAL - Abnormal; Notable for the following:    pH, Arterial 7.259 (*)    pO2, Arterial 77.4 (*)    Bicarbonate 18.9 (*)    Acid-base deficit 7.6 (*)    All other components within normal limits  POCT I-STAT TROPONIN I - Abnormal; Notable for the following:    Troponin i, poc 0.26 (*)    All other components within normal limits  POCT I-STAT, CHEM 8 - Abnormal; Notable for the following:    Potassium 6.0 (*)    BUN 121 (*)    Creatinine, Ser 4.60 (*)    Glucose, Bld >700 (*)    Calcium, Ion 1.11 (*)    All other components within normal limits  CBC  I-STAT TROPONIN I  I-STAT, CHEM 8  URINALYSIS, ROUTINE W REFLEX MICROSCOPIC  PRO B NATRIURETIC PEPTIDE   Dg Chest 2 View  07/19/2011  *RADIOLOGY REPORT*  Clinical Data: Shortness of breath.  High blood pressure. Diabetic.  Nonsmoker.  CHEST - 2 VIEW  Comparison: 04/17/2009.  Findings: Cardiomegaly.  Pulmonary vascular congestion.  Tortuous aorta.  Shoulder joint degenerative changes.  IMPRESSION: Cardiomegaly.  Pulmonary vascular congestion most notable centrally.  Tortuous aorta.  Original Report Authenticated By: Doug Sou, M.D.     1. Weakness generalized   2. Hyperglycemia   3. Abnormal ECG   4. Renal failure   5.  Dehydration   6. Hyperkalemia   7. LVH (left ventricular hypertrophy)   8. Cardiac enzymes elevated       MDM  Pt is seen and examined;  Initial history and physical completed.  Will follow.      IV 02 monitor stat EKG, stat, portal chest x-ray, IV fluid bolus, and will start insulin and glucometer. Will follow closely   ECG showing strain pattern and inverted T waves but No criteria for STEMI at this time.    Date: 07/19/2011  Rate: 80  Rhythm: normal sinus rhythm  QRS Axis: normal  Intervals: normal  ST/T Wave abnormalities: nonspecific ST/T changes and inverted T waves laterally and inferiorly  Conduction Disutrbances:LVH, LAA, diffuse repol. abnormalities  Narrative Interpretation:   Old EKG Reviewed: changes noted  8:00 AM  Chart review:  [name]  Date of Birth: [birth year]  [facility] Cardiology / [facility]  [id number]  [place], [place] [address]  [phone] Fax [phone]  HPI:  This pleasant 61 year old gentleman is seen for a scheduled six-month followup office visit. He is a nursing home 78 at the [facility]. He was born in [place]. We met him in October 2010 when he was admitted with acute congestive heart failure secondary to high blood pressure with diastolic dysfunction. Patient also has a history of hypercholesterolemia and diabetes. Since last visit he's been feeling well his last echocardiogram on 11/23/09 showed normal systolic function with an ejection fraction of 60-65% with marked LVH and hyperdynamic left ventricular systolic function and impaired relaxation. He had normal pulmonary artery pressure and no significant valve problems. He did have marked concentric LVH secondary to long-standing previous hypertension  ------------------------------------------------------------------------------------------------------------------  Results for orders placed during the hospital encounter of 07/19/11    CARDIAC PANEL(CRET KIN+CKTOT+MB+TROPI)      Component Value Range   Total CK 1127 (*) 7 - 232 (U/L)   CK, MB PENDING  0.3 - 4.0 (ng/mL)   Troponin I PENDING  <0.30 (ng/mL)   Relative Index PENDING  0.0 - 2.5   CBC      Component Value Range   WBC 10.0  4.0 - 10.5 (K/uL)   RBC 4.76  4.22 - 5.81 (MIL/uL)   Hemoglobin 15.4  13.0 - 17.0 (g/dL)   HCT 47.4  39.0 - 52.0 (%)   MCV 99.6  78.0 - 100.0 (fL)   MCH 32.4  26.0 - 34.0 (pg)   MCHC 32.5  30.0 - 36.0 (g/dL)   RDW 12.9  11.5 - 15.5 (%)   Platelets 254  150 - 400 (K/uL)  BLOOD GAS, ARTERIAL      Component Value Range   FIO2 0.21     pH, Arterial 7.259 (*) 7.350 - 7.450    pCO2 arterial 43.7  35.0 - 45.0 (mmHg)   pO2, Arterial 77.4 (*) 80.0 - 100.0 (mmHg)   Bicarbonate 18.9 (*) 20.0 - 24.0 (mEq/L)   TCO2 17.2  0 - 100 (mmol/L)   Acid-base deficit 7.6 (*) 0.0 - 2.0 (mmol/L)   O2 Saturation 92.9     Patient temperature 98.6     Collection site RIGHT RADIAL     Drawn by [id number]     Sample type ARTERIAL DRAW     Allens test (pass/fail) PASS  PASS   POCT I-STAT TROPONIN I      Component Value Range   Troponin i, poc 0.26 (*) 0.00 - 0.08 (ng/mL)   Comment NOTIFIED PHYSICIAN     Comment 3           POCT I-STAT, CHEM 8      Component Value Range   Sodium 138  135 - 145 (mEq/L)   Potassium 6.0 (*) 3.5 - 5.1 (mEq/L)   Chloride 107  96 - 112 (mEq/L)   BUN 121 (*) 6 - 23 (mg/dL)   Creatinine, Ser 4.60 (*) 0.50 - 1.35 (mg/dL)   Glucose, Bld >700 (*) 70 - 99 (mg/dL)   Calcium, Ion 1.11 (*) 1.12 - 1.32 (mmol/L)   TCO2 21  0 - 100 (mmol/L)   Hemoglobin 17.0  13.0 - 17.0 (g/dL)   HCT 50.0  39.0 - 52.0 (%)   Comment NOTIFIED PHYSICIAN     Dg Chest 2 View  07/19/2011  *  RADIOLOGY REPORT*  Clinical Data: Shortness of breath.  High blood pressure. Diabetic.  Nonsmoker.  CHEST - 2 VIEW  Comparison: 04/17/2009.  Findings: Cardiomegaly.  Pulmonary vascular congestion.  Tortuous aorta.  Shoulder joint degenerative changes.  IMPRESSION:  Cardiomegaly.  Pulmonary vascular congestion most notable centrally.  Tortuous aorta.  Original Report Authenticated By: Doug Sou, M.D.    CRITICAL CARE Performed by: Jacinto Reap.   Total critical care time: 30   Critical care time was exclusive of separately billable procedures and treating other patients.  Critical care was necessary to treat or prevent imminent or life-threatening deterioration.  Critical care was time spent personally by me on the following activities: development of treatment plan with patient and/or surrogate as well as nursing, discussions with consultants, evaluation of patient's response to treatment, examination of patient, obtaining history from patient or surrogate, ordering and performing treatments and interventions, ordering and review of laboratory studies, ordering and review of radiographic studies, pulse oximetry and re-evaluation of patient's condition.   8:01 AM  Discussed with Cardiology Dr Ron Parker, agrees with current plan and will be happy to follow as consultant;  Recommends admission to medicine in light of high blood sugar, renal failure, etc.  [facility] Paged.    8:10 AM  [facility] consulted, Dr Dillard Essex .  Will see pt in ED to arrange transfer.  BP remains stable.       Charlii Yost A. Lauris Poag, MD 07/19/11 0811  8:42 AM Pt remains stable.  Dr Inis Sizer in room w/ patient at this time.    Laterica Matarazzo A. Lauris Poag, MD 07/19/11 [phone]

## 2011-07-19 NOTE — ED Notes (Signed)
[id number] Expected date:07/19/11<BR> Expected time: 6:33 AM<BR> Means of arrival:Ambulance<BR> Comments:<BR> Headache, chest pain, hyperglycemic

## 2011-07-19 NOTE — ED Notes (Signed)
Tammy notified Dr Lauris Poag that Blood Glucose was greater that 700.

## 2011-07-19 NOTE — ED Notes (Signed)
Tammy notified Dr Lauris Poag that Troponin level was 0.26.

## 2011-07-19 NOTE — Progress Notes (Signed)
Notified cardiology pa who is on floor assessing pt regarding lab results of ckmb and troponin.  Will continue to monitor. 12 lead EKG ordered. Suezanne Cheshire

## 2011-07-19 NOTE — ED Notes (Signed)
Per EMS: [facility] called to home by patient for c/o headache, blurry vision, trouble holding objects, and pain all over. cbg was too high to read. A&O. Pt has hx of stroke with right sided weakness.

## 2011-07-19 NOTE — Progress Notes (Signed)
rec'd pt from [place] ed via care link, insulin gtt infusing at 21.6cc/hr and ns@150cc /hr. Bedside report rec'd from [place] rn. Settled pt in room oriented to room, call bell within reach. Will continue to monitor. Suezanne Cheshire

## 2011-07-19 NOTE — ED Notes (Signed)
Insulin started at 6.79ml/hr pts CBG greater than 700

## 2011-07-19 NOTE — ED Notes (Signed)
CBG >600. Insulin 5.4 per hour

## 2011-07-19 NOTE — H&P (Signed)
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 Mare Ferrari who presents with complaints of generalized weakness. She denies chest pain, shortness of breath, nausea vomiting, leg swelling, abdominal pain, focal weakness, diarrhea, no hematochezia nor melena. It is noted of from ED records and that patient initially complained to EMS of headache, shortness of breath and chest pain; but at the time time of my interview he denies having had any chest pain or shortness of breath. He states that he's not had anything to eat for the past 2 days -since Tuesday, and that is because he was too weak to get anything and tried to call [employer] 'but all they said was that they could not understand him'. He states he is very hungry, and also complained of polyuria and polydipsia. He is somnolent but easily aroused and oriented x3, but has intermittent confusion. He denies any prior history of diabetes. He was seen in the ED and labwork revealed a potassium of 6.0 with a blood glucose of greater than 700, BUN of 121 and creatinine of 4.6, his last creatinine are in 2010 was 1.5. Cardiac enzymes were done and that his troponin and came back elevated at 0.26, an EKG was done and showed normal sinus rhythm with diffuse T-wave inversions, LVH with lateral STdepression/ repolarization changes -lumbar cardiology was consulted per ED and Dr. Ron Parker and indicated that they will see the patient in consultation and requested admission to the hospitalist service and transferto [facility].  Review of Systems:  The patient denies anorexia, fever,, vision loss, decreased hearing, hoarseness, chest pain, syncope, dyspnea on exertion, peripheral edema, balance deficits, hemoptysis, abdominal pain,  melena, hematochezia, severe indigestion/heartburn, hematuria, incontinence, suspicious skin lesions, transient blindness, difficulty walking, depression, abnormal bleeding.  Past Medical History: Past Medical History  Diagnosis Date  . Hyperlipidemia     HYPERCHOLESTEROLEMIA  . Anemia   . Diabetes mellitus   . Hypertension     MARKED LEFT VENTRICULAR HYPERTROPHY BY PREVIOUS ECHOCARDIOGRAM--HE HAS HYPERDYNAMIC LEFT VENTRICULAR SYSTOLIC FUNCTION AND HAS IMPAIRED RELAXATION BY ECHO   History reviewed. No pertinent past surgical history.  Medications: Prior to Admission medications   Medication Sig Start Date End Date Taking? Authorizing Provider  acetaminophen (TYLENOL) 325 MG tablet Take 650 mg by mouth as needed.      Historical Provider, MD  amLODipine (NORVASC) 5 MG tablet Take 1 tablet (5 mg total) by mouth daily. 02/20/11   Darlin Coco, MD  furosemide (LASIX) 40 MG tablet Take 1 tablet (40 mg total) by mouth daily. Taking 20mg . daily 02/20/11   Darlin Coco, MD  hydrochlorothiazide (HYDRODIURIL) 25 MG tablet TAKE ONE TABLET BY MOUTH EVERY DAY 04/11/11   Darlin Coco, MD  isosorbide mononitrate (IMDUR) 60 MG 24 hr tablet Take 1 tablet (60 mg total) by mouth daily. 02/20/11   Darlin Coco, MD  lisinopril (PRINIVIL,ZESTRIL) 20 MG tablet Take 1 tablet (20 mg total) by mouth 2 (two) times daily. 02/20/11   Darlin Coco, MD  metoprolol (LOPRESSOR) 100 MG tablet TAKE ONE TABLET BY MOUTH TWICE DAILY 04/11/11   Darlin Coco, MD  metoprolol (TOPROL-XL) 100 MG 24 hr tablet Take 1 tablet (100 mg total) by mouth 2 (two) times daily. 02/20/11   Darlin Coco, MD  nitroGLYCERIN (NITROSTAT)  0.4 MG SL tablet Place 0.4 mg under the tongue as needed.      Historical Provider, MD  simvastatin (ZOCOR) 40 MG tablet Take 1 tablet (40 mg total) by mouth daily. 02/20/11   Darlin Coco, MD    Allergies:   Allergies  Allergen Reactions  . Aspirin     itching    Social  History:  reports that he has never smoked. He does not have any smokeless tobacco history on file. He reports that he does not drink alcohol. His drug history not on file.  Family History: No family history on file.  Physical Exam: Filed Vitals:   07/19/11 0652 07/19/11 0657  BP:  113/63  Pulse:  81  Temp:  98.4 F (36.9 C)  TempSrc:  Oral  Resp:  20  SpO2: 99% 98%   Constitutional: Vital signs reviewed.  He is somnolent  easily aroused and oriented x3, some confusion noted. cooperative with exam.   Head: Normocephalic and atraumatic Mouth: no erythema or exudates, very dry mucous membranes Eyes: PERRL, EOMI, conjunctivae normal, No scleral icterus.  Neck: Supple, Trachea midline normal ROM, No JVD, mass, thyromegaly, or carotid bruit present.  Cardiovascular: RRR, S1 normal, S2 normal, no MRG, pulses symmetric and intact bilaterally Pulmonary/Chest: CTAB, no wheezes, rales, or rhonchi Abdominal: Soft. Non-tender, non-distended, bowel sounds are normal, no masses, organomegaly, or guarding present. Obese. Extremities: No cyanosis or edema  Hematology: no cervical, inginal, or axillary adenopathy.  Neurological: Somnolent but easily aroused ,O x3, Strength is normal and symmetric bilaterally, cranial nerve II-XII are grossly intact, no focal motor deficit, sensory intact to light touch bilaterally.  Skin: Warm,very dry and intact. No rash, cyanosis.   Labs on Admission:   [facility] 07/19/11 0728  NA 138  K 6.0*  CL 107  CO2 --  GLUCOSE >700*  BUN 121*  CREATININE 4.60*  CALCIUM --  MG --  PHOS --   No results found for this basename: AST:2,ALT:2,ALKPHOS:2,BILITOT:2,PROT:2,ALBUMIN:2 in the last 72 hours No results found for this basename: LIPASE:2,AMYLASE:2 in the last 72 hours  Basename 07/19/11 0728 07/19/11 0715  WBC -- 10.0  NEUTROABS -- --  HGB 17.0 15.4  HCT 50.0 47.4  MCV -- 99.6  PLT -- 254    Basename 07/19/11 0715  CKTOTAL 1127*  CKMB 12.5*   CKMBINDEX --  TROPONINI 0.35*   No results found for this basename: TSH,T4TOTAL,FREET3,T3FREE,THYROIDAB in the last 72 hours No results found for this basename: VITAMINB12:2,FOLATE:2,FERRITIN:2,TIBC:2,IRON:2,RETICCTPCT:2 in the last 72 hours  Radiological Exams on Admission: Dg Chest 2 View  07/19/2011  *RADIOLOGY REPORT*  Clinical Data: Shortness of breath.  High blood pressure. Diabetic.  Nonsmoker.  CHEST - 2 VIEW  Comparison: 04/17/2009.  Findings: Cardiomegaly.  Pulmonary vascular congestion.  Tortuous aorta.  Shoulder joint degenerative changes.  IMPRESSION: Cardiomegaly.  Pulmonary vascular congestion most notable centrally.  Tortuous aorta.  Original Report Authenticated By: Doug Sou, M.D.    Assessment/Plan Present on Admission:  .Diabetic hyperosmolar non-ketotic state/new onset DM  -As discussed above are initial blood glucose greater than 700 with a CO2 of 21(on the i-STAT), will continue IV insulin via glucostabilizer the obtain a hemoglobin A1c,IVF, follow and transition to Lantus when he meets criteria. Patient will also need a diabetes education for his new onset diabetes. .Hyperkalemia -Patient is on an IV insulin as above, he also received calcium gluconate and Kayexalate in the ED. A repeat Bmet is to be done at noon, follow and further treat as  appropriate pending results.  .Acute on chronic kidney failure -Multifactorial , he is dehydrated and was on diuretics as well as ACE inhibitors outpatient, hydrate with close monitoring of his renal function given his history of diastolic CHF. Holding off diuretics and ACE inhibitor for now. Follow and obtain a renal ultrasound/renal consult  if not improving with IV fluids  .Troponin level elevated/Abnormal EKG -Patient has been chest pain-free , EKG as above , he is allergic to aspirin so I have started him on Plavix, continue beta blocker as blood pressure tolerates and obtain 2-D echo, cardiology was consulted and they  will see for evaluation and further recommendations once he arrives [facility]  Hypertension- beta blocker as above Volume depletion-hydrate as above Chronic diastolic CHF - monitor fluid status closely with hydration, holding of diuretics for now secondary to his renal failure/volume depletion  Mahmoud Blazejewski C 07/19/2011, 9:46 AM

## 2011-07-19 NOTE — Progress Notes (Signed)
  2D Echocardiogram has been performed.  Nyra Capes Nuri Larmer 07/19/2011, 3:06 PM

## 2011-07-19 NOTE — ED Notes (Signed)
Johnell Comings RN at [phone] to give report

## 2011-07-19 NOTE — Consult Note (Signed)
Cardiology Consult Note   Patient ID: Robert Chaney MRN: [id number], DOB/AGE: 10/20/1950   Admit date: 07/19/2011 Date of Consult: 07/19/2011  Primary Physician: No primary provider on file. Primary Cardiologist: Wynell Balloon, MD  Pt. Profile: [name] is a 61 yo male with PMHx significant for diastolic CHF (echo [id number] revealed severe LVH, mild focal basal and severe concentric hypertrophy of the septum, EF 123456, grade 2 diastolic dysfunction), type 2 DM, HL and HTN who was transferred from [facility] to [facility] hospital with diabetic HHS.  Reason for consult: evaluation of chest pain and elevated TnI  Problem List: Past Medical History  Diagnosis Date  . Hyperlipidemia     HYPERCHOLESTEROLEMIA  . Anemia   . Diabetes mellitus   . Hypertension     MARKED LEFT VENTRICULAR HYPERTROPHY BY PREVIOUS ECHOCARDIOGRAM--HE HAS HYPERDYNAMIC LEFT VENTRICULAR SYSTOLIC FUNCTION AND HAS IMPAIRED RELAXATION BY ECHO  . Diastolic CHF, chronic     A.  03/2009 Echo: EF 60-65%, Gr II diast dysfxn  . Murmur     A.  Mild AI by 10/10 echo    Past Surgical History  Procedure Date  . No past surgeries      Allergies:  Allergies  Allergen Reactions  . Aspirin     itching    HPI:   Pt is quite tired and mildly confused at the time of interview. He has a history of type 2 DM on a previous discharge summary in 2010, but does not take insulin or oral hypoglycemics outpatient. He reports not eating x 3 days, but reports being hungry. He attempted to eat, but was profoundly weak, and was unable to. He reports feeling general malaise. He endorses lightheadedness and increased urination, thirst and vision changes worsening since that time. He denies nausea/vomiting. He denies chest pain, shortness of breath, palpitations, diaphoresis. No orthopnea, PND and LE swelling. Per H&P, it was noted in the ED note that pt had originally complained of headache, SOB and chest pain.   He presented to [facility] and was found to have profound hyperglycemia (>700) and hyperkalemia at potassium of 6.0. BUN/Cr at 121/4.6. Last Cr in 2010 was 1.5.   Pt POC TnI 0.23, CXR reveals cardiomegaly, central pulmonary vascular congestion and tortuous aorta. EKG apparently revealed NSR with LVH, diffuse TWI, and lateral ST depressions. He was diagnosed with hyperglycemic hyperosmolar syndrome. Regarding enzyme elevation with abnormal EKG, he is apparently allergic to aspirin. He was started on Plavix, BB was continued and 2D echo was ordered which is still pending.   Inpatient Medications:     . sodium chloride  999 mL Intravenous Once  . calcium gluconate  1 g Intravenous Once  . clopidogrel  75 mg Oral Q breakfast  . enoxaparin  30 mg Subcutaneous Q24H  . influenza  inactive virus vaccine  0.5 mL Intramuscular Tomorrow-1000  . insulin regular  0-10 Units Intravenous TID WC  . isosorbide mononitrate  60 mg Oral Daily  . metoprolol  50 mg Oral BID  . pneumococcal 23 valent vaccine  0.5 mL Intramuscular Tomorrow-1000  . simvastatin  40 mg Oral q1800  . sodium chloride  500 mL Intravenous STAT  . sodium polystyrene  30 g Oral Once  . DISCONTD: sodium chloride   Intravenous Once   Prescriptions prior to admission  Medication Sig Dispense Refill  . acetaminophen (TYLENOL) 325 MG tablet Take 650 mg by mouth as needed.        Marland Kitchen amLODipine (  NORVASC) 5 MG tablet Take 1 tablet (5 mg total) by mouth daily.  30 tablet  11  . furosemide (LASIX) 40 MG tablet Take 1 tablet (40 mg total) by mouth daily. Taking 20mg . daily  30 tablet  11  . hydrochlorothiazide (HYDRODIURIL) 25 MG tablet TAKE ONE TABLET BY MOUTH EVERY DAY  30 tablet  11  . isosorbide mononitrate (IMDUR) 60 MG 24 hr tablet Take 1 tablet (60 mg total) by mouth daily.  30 tablet  11  . lisinopril (PRINIVIL,ZESTRIL) 20 MG tablet Take 1 tablet (20 mg total) by mouth 2 (two) times daily.  60 tablet  11  . metoprolol (LOPRESSOR) 100 MG tablet TAKE ONE  TABLET BY MOUTH TWICE DAILY  60 tablet  11  . metoprolol (TOPROL-XL) 100 MG 24 hr tablet Take 1 tablet (100 mg total) by mouth 2 (two) times daily.  60 tablet  11  . nitroGLYCERIN (NITROSTAT) 0.4 MG SL tablet Place 0.4 mg under the tongue as needed.        . simvastatin (ZOCOR) 40 MG tablet Take 1 tablet (40 mg total) by mouth daily.  30 tablet  11    History reviewed. No pertinent family history.   History   Social History  . Marital Status: Legally Separated    Spouse Name: N/A    Number of Children: N/A  . Years of Education: N/A   Occupational History  . CNA     At [employer] History Main Topics  . Smoking status: Never Smoker   . Smokeless tobacco: Never Used  . Alcohol Use: No  . Drug Use: No  . Sexually Active: No   Other Topics Concern  . Not on file   Social History Narrative   Originally from [place].      Review of Systems: General: positive for weakness, fatigue, negative for chills, fever, night sweats or weight changes.  Cardiovascular: negative for chest pain, dyspnea on exertion, edema, orthopnea, palpitations, paroxysmal nocturnal dyspnea or shortness of breath Dermatological: negative for rash Respiratory: negative for cough or wheezing Urologic: negative for hematuria, positive for polyuria Metabolic: positive for polyphagia, polydipsia Abdominal: negative for nausea, vomiting, diarrhea, bright red blood per rectum, melena, or hematemesis Neurologic: negative for visual changes, syncope, or dizziness All other systems reviewed and are otherwise negative except as noted above.  Physical Exam: Blood pressure 120/62, pulse 87, temperature 97.7 F (36.5 C), temperature source Oral, resp. rate 17, height 5\' 8"  (1.727 m), weight 79.1 kg (174 lb 6.1 oz), SpO2 92.00%.    General: Mildly confused, in no acute distress. Head: Dry MM, Normocephalic, atraumatic, sclera non-icteric, no xanthomas, nares are without discharge. Neck: Negative for  carotid bruits. JVD not elevated. Lungs: Clear bilaterally to auscultation without wheezes, rales, or rhonchi. Breathing is unlabored. Heart: RRR with S1 S2, II/VI diastolic murmur at LLSB, no rubs, or gallops appreciated. Abdomen: Soft, non-tender, non-distended with normoactive bowel sounds. No hepatomegaly. No rebound/guarding. No obvious abdominal masses. Msk:  Strength and tone appears normal for age. Extremities: No clubbing, cyanosis or edema.  Distal pedal pulses are 2+ and equal bilaterally. Neuro: Alert and oriented X 3. Moves all extremities spontaneously. Psych:  Responds to questions appropriately with a normal affect.  Labs: Recent Labs  Basename 07/19/11 1344 07/19/11 0728 07/19/11 0715   WBC 11.7* -- 10.0   HGB 15.0 17.0 --   HCT 43.5 50.0 --   MCV 93.8 -- 99.6   PLT 226 -- 254  Lab 07/19/11 1344 07/19/11 0728  NA 149* 138  K 3.3* 6.0*  CL 108 107  CO2 25 --  BUN 117* 121*  CREATININE 4.72* 4.60*  CALCIUM 9.2 --  PROT -- --  BILITOT -- --  ALKPHOS -- --  ALT -- --  AST -- --  AMYLASE -- --  LIPASE -- --  GLUCOSE 773* >700*   No results found for this basename: HGBA1C in the last 72 hours Recent Labs  Basename 07/19/11 1340 07/19/11 0715   CKTOTAL 1510* 1127*   CKMB 12.0* 12.5*   CKMBINDEX -- --   TROPONINI 0.69* 0.35*   Radiology/Studies: Dg Chest 2 View  07/19/2011  *RADIOLOGY REPORT*  Clinical Data: Shortness of breath.  High blood pressure. Diabetic.  Nonsmoker.  CHEST - 2 VIEW  Comparison: 04/17/2009.  Findings: Cardiomegaly.  Pulmonary vascular congestion.  Tortuous aorta.  Shoulder joint degenerative changes.  IMPRESSION: Cardiomegaly.  Pulmonary vascular congestion most notable centrally.  Tortuous aorta.  Original Report Authenticated By: Doug Sou, M.D.    EKG: NSR, 78 bpm, inferolateral TWI, LVH  ASSESSMENT AND PLAN:   Mr. Bickhart is a 61 yo male with PMHx significant for diastolic CHF (echo [id number] revealed severe LVH, mild focal basal  and severe concentric hypertrophy of the septum, EF 123456, grade 2 diastolic dysfunction), type 2 DM, HL and HTN who was transferred from [facility] to [facility] with diabetic HHS managed by hospitalist service.   1. Elevated TnI- CK-MB and TnI are mildly elevated. Pt is lethargic and confused at present, so I am unsure as to the reliability of his symptoms in his current state. He has significant cardiac risk factors is chronic, uncontrolled HTN evidenced by severe LVH on prior echo in 2010.   - Will order HgbA1C  - Continue to cycle cardiac biomarkers  - Daily EKGs  - NTG SL PRN  - Continue Plavix/Lopressor  - Continue holding ACEI for now in setting of acute on chronic kidney disease  - Will hold Zocor in setting of elevated CK  2. Chronic diastolic CHF- likely in the setting of chronic, uncontrolled HTN. Evidenced by prior echo. On exam, he is grossly euvolemic, without rales.     - Review 2D Echo results  - Once rehydrated and stabilized, would restart Lasix  3. HTN- will need strict control once stable  - Continue Imdur   - Restart Lasix as above  - Restart outpatient Norvasc once stable   Signed, R. Valeria Batman, PA-C 07/19/2011, 3:08 PM  Patient seen and examined. I agree with the assessment and plan as detailed above. See also my additional thoughts below.  We know that the patient has a history of severe left triple hypertrophy. There is also a history of severe hypertension. Unfortunately there is not an EKG from the past available for review at this time. His current EKG is abnormal. It is possible however that this is the type of EKG these had in the past with his severe left triple hypertrophy. The patient does have some elevation of his troponin. There is marked elevation of the CPK. He is being treated for a hyperosmolar state with hyperkalemia and renal failure. At this time I suspect that his troponin elevation is not due to an acute coronary syndrome. He is receiving  excellent care from the primary team to treat his glucose and hydration and his renal function. I am hopeful that his CPK we'll not show marked elevation consistent with rhabdomyolysis. I have  suggested that we hold his statin at this time, but I doubt that it is the cause of his elevated CPK. As the patient recovers from his other medical problems we will decide about other cardiac testing.  Dola Argyle, MD, [facility] 07/19/2011 3:43 PM

## 2011-07-20 ENCOUNTER — Inpatient Hospital Stay (HOSPITAL_COMMUNITY): Payer: 59

## 2011-07-20 DIAGNOSIS — R9431 Abnormal electrocardiogram [ECG] [EKG]: Secondary | ICD-10-CM

## 2011-07-20 DIAGNOSIS — R748 Abnormal levels of other serum enzymes: Secondary | ICD-10-CM

## 2011-07-20 LAB — COMPREHENSIVE METABOLIC PANEL
ALT: 24 U/L (ref 0–53)
AST: 38 U/L — ABNORMAL HIGH (ref 0–37)
Alkaline Phosphatase: 90 U/L (ref 39–117)
BUN: 114 mg/dL — ABNORMAL HIGH (ref 6–23)
Calcium: 8.8 mg/dL (ref 8.4–10.5)
GFR calc non Af Amer: 13 mL/min — ABNORMAL LOW (ref >90)
Glucose, Bld: 399 mg/dL — ABNORMAL HIGH (ref 70–99)
Sodium: 151 mEq/L — ABNORMAL HIGH (ref 135–145)
Total Bilirubin: 0.6 mg/dL (ref 0.3–1.2)
Total Protein: 7.5 g/dL (ref 6.0–8.3)

## 2011-07-20 LAB — CARDIAC PANEL(CRET KIN+CKTOT+MB+TROPI)
CK, MB: 12.3 ng/mL (ref 0.3–4.0)
Relative Index: 0.3 (ref 0.0–2.5)
Total CK: 3429 U/L — ABNORMAL HIGH (ref 7–232)
Troponin I: 1.41 ng/mL (ref <0.30)

## 2011-07-20 LAB — GLUCOSE, CAPILLARY
Glucose-Capillary: 129 mg/dL — ABNORMAL HIGH (ref 70–99)
Glucose-Capillary: 141 mg/dL — ABNORMAL HIGH (ref 70–99)
Glucose-Capillary: 146 mg/dL — ABNORMAL HIGH (ref 70–99)
Glucose-Capillary: 159 mg/dL — ABNORMAL HIGH (ref 70–99)
Glucose-Capillary: 191 mg/dL — ABNORMAL HIGH (ref 70–99)
Glucose-Capillary: 444 mg/dL — ABNORMAL HIGH (ref 70–99)
Glucose-Capillary: 472 mg/dL — ABNORMAL HIGH (ref 70–99)

## 2011-07-20 LAB — BASIC METABOLIC PANEL
Calcium: 8.6 mg/dL (ref 8.4–10.5)
GFR calc Af Amer: 16 mL/min — ABNORMAL LOW (ref >90)
GFR calc non Af Amer: 14 mL/min — ABNORMAL LOW (ref >90)
Sodium: 155 mEq/L — ABNORMAL HIGH (ref 135–145)

## 2011-07-20 LAB — CBC
HCT: 42.2 % (ref 39.0–52.0)
Hemoglobin: 14.6 g/dL (ref 13.0–17.0)
MCH: 32.5 pg (ref 26.0–34.0)
MCHC: 34.6 g/dL (ref 30.0–36.0)
RDW: 12.7 % (ref 11.5–15.5)

## 2011-07-20 LAB — CK: Total CK: 3436 U/L — ABNORMAL HIGH (ref 7–232)

## 2011-07-20 LAB — SODIUM, URINE, RANDOM: Sodium, Ur: 46 mEq/L

## 2011-07-20 LAB — CREATININE, URINE, RANDOM: Creatinine, Urine: 197.96 mg/dL

## 2011-07-20 MED ORDER — PNEUMOCOCCAL VAC POLYVALENT 25 MCG/0.5ML IJ INJ
0.5000 mL | INJECTION | INTRAMUSCULAR | Status: AC
  Administered 2011-07-22: 0.5 mL via INTRAMUSCULAR
  Filled 2011-07-20: qty 0.5

## 2011-07-20 MED ORDER — DEXTROSE-NACL 5-0.45 % IV SOLN: INTRAVENOUS | Status: DC

## 2011-07-20 MED ORDER — INSULIN ASPART 100 UNIT/ML ~~LOC~~ SOLN
0.0000 [IU] | Freq: Every day | SUBCUTANEOUS | Status: DC
  Administered 2011-07-20 – 2011-07-22 (×2): 2 [IU] via SUBCUTANEOUS
  Filled 2011-07-20: qty 3

## 2011-07-20 MED ORDER — OXYCODONE HCL 5 MG PO TABS: 5.0000 mg | ORAL_TABLET | ORAL | Status: DC | PRN

## 2011-07-20 MED ORDER — INSULIN GLARGINE 100 UNIT/ML ~~LOC~~ SOLN
10.0000 [IU] | Freq: Once | SUBCUTANEOUS | Status: AC
  Administered 2011-07-20: 10 [IU] via SUBCUTANEOUS

## 2011-07-20 MED ORDER — METOPROLOL TARTRATE 25 MG PO TABS
25.0000 mg | ORAL_TABLET | Freq: Two times a day (BID) | ORAL | Status: DC
  Administered 2011-07-20 – 2011-07-21 (×2): 25 mg via ORAL
  Filled 2011-07-20 (×3): qty 1

## 2011-07-20 MED ORDER — INFLUENZA VIRUS VACC SPLIT PF IM SUSP
0.5000 mL | INTRAMUSCULAR | Status: AC
  Administered 2011-07-22: 0.5 mL via INTRAMUSCULAR
  Filled 2011-07-20: qty 0.5

## 2011-07-20 MED ORDER — INSULIN GLARGINE 100 UNIT/ML ~~LOC~~ SOLN
10.0000 [IU] | Freq: Every day | SUBCUTANEOUS | Status: AC
  Administered 2011-07-20: 10 [IU] via SUBCUTANEOUS

## 2011-07-20 MED ORDER — SODIUM CHLORIDE 0.9 % IV SOLN
INTRAVENOUS | Status: DC
  Administered 2011-07-20 (×2): via INTRAVENOUS

## 2011-07-20 MED ORDER — DEXTROSE 50 % IV SOLN: 25.0000 mL | INTRAVENOUS | Status: DC | PRN

## 2011-07-20 MED ORDER — INSULIN ASPART 100 UNIT/ML ~~LOC~~ SOLN
20.0000 [IU] | Freq: Once | SUBCUTANEOUS | Status: AC
  Administered 2011-07-20: 20 [IU] via SUBCUTANEOUS

## 2011-07-20 MED ORDER — INSULIN ASPART 100 UNIT/ML ~~LOC~~ SOLN
3.0000 [IU] | Freq: Three times a day (TID) | SUBCUTANEOUS | Status: DC
  Administered 2011-07-21 (×2): 3 [IU] via SUBCUTANEOUS
  Filled 2011-07-20: qty 3

## 2011-07-20 MED ORDER — LIDOCAINE HCL 2 % EX GEL
Freq: Once | CUTANEOUS | Status: DC
  Filled 2011-07-20: qty 5

## 2011-07-20 MED ORDER — SODIUM CHLORIDE 0.9 % IV SOLN
INTRAVENOUS | Status: AC
  Filled 2011-07-20 (×2): qty 1

## 2011-07-20 MED ORDER — INSULIN ASPART 100 UNIT/ML ~~LOC~~ SOLN
0.0000 [IU] | Freq: Three times a day (TID) | SUBCUTANEOUS | Status: DC
  Administered 2011-07-20: 3 [IU] via SUBCUTANEOUS
  Administered 2011-07-21: 15 [IU] via SUBCUTANEOUS
  Administered 2011-07-21: 4 [IU] via SUBCUTANEOUS
  Administered 2011-07-21: 20 [IU] via SUBCUTANEOUS
  Administered 2011-07-22: 7 [IU] via SUBCUTANEOUS
  Administered 2011-07-22: 6 [IU] via SUBCUTANEOUS
  Administered 2011-07-22: 11 [IU] via SUBCUTANEOUS
  Administered 2011-07-23 (×2): 7 [IU] via SUBCUTANEOUS

## 2011-07-20 MED ORDER — INSULIN GLARGINE 100 UNIT/ML ~~LOC~~ SOLN
20.0000 [IU] | Freq: Every day | SUBCUTANEOUS | Status: DC
  Administered 2011-07-20: 20 [IU] via SUBCUTANEOUS

## 2011-07-20 MED ORDER — MORPHINE SULFATE 2 MG/ML IJ SOLN: 1.0000 mg | INTRAMUSCULAR | Status: DC | PRN

## 2011-07-20 MED ORDER — INSULIN REGULAR BOLUS VIA INFUSION
0.0000 [IU] | Freq: Three times a day (TID) | INTRAVENOUS | Status: DC
  Filled 2011-07-20 (×3): qty 10

## 2011-07-20 NOTE — Progress Notes (Signed)
Patient ID: Robert Chaney, male   DOB: 1950/07/10, 61 y.o.   MRN: [id number]    SUBJECTIVE: No chest pain or dyspnea.  Creatinine elevated but improving.  On insulin gtt.     . clopidogrel  75 mg Oral Q breakfast  . enoxaparin  30 mg Subcutaneous Q24H  . influenza  inactive virus vaccine  0.5 mL Intramuscular Tomorrow-1000  . insulin aspart  20 Units Subcutaneous Once  . insulin aspart  20 Units Subcutaneous Once  . insulin glargine  10 Units Subcutaneous Daily  . insulin glargine  15 Units Subcutaneous Daily  . insulin regular  0-10 Units Intravenous TID WC  . isosorbide mononitrate  60 mg Oral Daily  . metoprolol  25 mg Oral BID  . pneumococcal 23 valent vaccine  0.5 mL Intramuscular Tomorrow-1000  . potassium chloride SA      . potassium chloride  40 mEq Oral Once  . white petrolatum      . DISCONTD: influenza  inactive virus vaccine  0.5 mL Intramuscular Tomorrow-1000  . DISCONTD: insulin aspart  0-15 Units Subcutaneous TID WC  . DISCONTD: insulin aspart  0-5 Units Subcutaneous QHS  . DISCONTD: insulin regular  0-10 Units Intravenous TID WC  . DISCONTD: metoprolol  50 mg Oral BID  . DISCONTD: pneumococcal 23 valent vaccine  0.5 mL Intramuscular Tomorrow-1000  . DISCONTD: simvastatin  40 mg Oral q1800      Filed Vitals:   07/20/11 0100 07/20/11 0400 07/20/11 0751 07/20/11 0800  BP: 116/64 101/48  114/35  Pulse: 74 81  79  Temp:  98.4 F (36.9 C) 98.8 F (37.1 C) 98.8 F (37.1 C)  TempSrc:  Oral Oral Oral  Resp: 11 21  18   Height:      Weight:      SpO2: 98% 96%  97%    Intake/Output Summary (Last 24 hours) at 07/20/11 1209 Last data filed at 07/20/11 1031  Gross per 24 hour  Intake 1913.72 ml  Output    801 ml  Net 1112.72 ml    LABS: Basic Metabolic Panel:  Basename 07/20/11 0600 07/19/11 2147  NA 151* 154*  K 3.7 3.3*  CL 115* 114*  CO2 21 25  GLUCOSE 399* 269*  BUN 114* 116*  CREATININE 4.32* 4.71*  CALCIUM 8.8 9.3  MG -- --  PHOS -- --    Liver Function Tests:  Basename 07/20/11 0600  AST 38*  ALT 24  ALKPHOS 90  BILITOT 0.6  PROT 7.5  ALBUMIN 3.1*   No results found for this basename: LIPASE:2,AMYLASE:2 in the last 72 hours CBC:  Basename 07/20/11 0600 07/19/11 1344  WBC 14.3* 11.7*  NEUTROABS -- --  HGB 14.6 15.0  HCT 42.2 43.5  MCV 94.0 93.8  PLT 197 226   Cardiac Enzymes:  Basename 07/19/11 2353 07/19/11 1928 07/19/11 1340  CKTOTAL 3263* 2523* 1510*  CKMB 12.3* 12.2* 12.0*  CKMBINDEX -- -- --  TROPONINI 1.47* 1.20* 0.69*   BNP: No components found with this basename: POCBNP:3 D-Dimer: No results found for this basename: DDIMER:2 in the last 72 hours Hemoglobin A1C:  Basename 07/19/11 1514  HGBA1C 12.0*   Fasting Lipid Panel: No results found for this basename: CHOL,HDL,LDLCALC,TRIG,CHOLHDL,LDLDIRECT in the last 72 hours Thyroid Function Tests: No results found for this basename: TSH,T4TOTAL,FREET3,T3FREE,THYROIDAB in the last 72 hours Anemia Panel: No results found for this basename: VITAMINB12,FOLATE,FERRITIN,TIBC,IRON,RETICCTPCT in the last 72 hours  RADIOLOGY: Dg Chest 2 View  07/19/2011  *RADIOLOGY REPORT*  Clinical Data: Shortness of breath.  High blood pressure. Diabetic.  Nonsmoker.  CHEST - 2 VIEW  Comparison: 04/17/2009.  Findings: Cardiomegaly.  Pulmonary vascular congestion.  Tortuous aorta.  Shoulder joint degenerative changes.  IMPRESSION: Cardiomegaly.  Pulmonary vascular congestion most notable centrally.  Tortuous aorta.  Original Report Authenticated By: Doug Sou, M.D.    PHYSICAL EXAM General: NAD Neck: No JVD, no thyromegaly or thyroid nodule.  Lungs: Clear to auscultation bilaterally with normal respiratory effort. CV: Nondisplaced PMI.  Heart regular S1/S2, no S3/S4, no murmur.  No peripheral edema.  No carotid bruit.  Normal pedal pulses.  Abdomen: Soft, nontender, no hepatosplenomegaly, no distention.  Neurologic: Alert and oriented x 3.  Psych: Normal  affect. Extremities: No clubbing or cyanosis.   TELEMETRY: Reviewed telemetry pt in NSR:  ASSESSMENT AND PLAN: 61 yo admitted with hyperglycemic hyperosmolar state, denies prior knowledge of diabetes.  He was noted to be in ARF with creatinine > 4.  He had CK elevated to > 3000, TnI to 1.47.  No chest pain.  Echo with normal EF, severe LVH with some LVOT obstruction and mitral valve SAM.  1. Elevated troponin: In setting of ARF and hyperosmolar nonketotic state.  Has risk factors for CAD.  No chest pain.  Highest suspicion for demand ischemia.   - Continue Plavix (ASA allergy), beta blocker.  - Statin when CK comes down.  - repeat cardiac enzymes to make sure TnI has peaked.  - Functional study when recovers. 2.  Diastolic CHF: Does not appear volume overloaded.  Has long-standing HTN with severe LVH.  Given dynamic LV obstruction, would titrate up beta blocker as BP allows.  3. ARF: Suspect primarily due to dehydration with HHS.  CPK not so high that would expect severe ARF from this elevation alone. Getting IV fluid.    Loralie Champagne 07/20/2011 12:13 PM

## 2011-07-20 NOTE — Progress Notes (Signed)
[facility] [place] TEAM 8  Subjective: The patient is a 61 year old male originally from [place] who works as a Quarry manager at [employer] with past medical history significant for hypertension and diastolic CHF followed by Dr. Mare Ferrari who presents with complaints of generalized weakness.  He states that he's not had anything to eat for the past 2 days.  He was seen in the ED and labwork revealed a potassium of 6.0 with a blood glucose of greater than 700, BUN of 121 and creatinine of 4.6, his last creatinine are in 2010 was 1.5.  Cardiac enzymes were done and his troponin was noted to be elevated at 0.26.  An EKG was done and showed normal sinus rhythm with diffuse T-wave inversions.  At present the pt is resting comfortably in his bed at [facility] on 3300.  He is somewhat lethargic, but is able to answer direct questions.  He has been confused th/o the night as per the RN staff.  Currently he denies f/c, sob, n/v, cp, or abdom pain.   Objective: Weight change:   Intake/Output Summary (Last 24 hours) at 07/20/11 0732 Last data filed at 07/20/11 0542  Gross per 24 hour  Intake 2913.72 ml  Output    575 ml  Net 2338.72 ml   Blood pressure 101/48, pulse 81, temperature 98.4 F (36.9 C), temperature source Oral, resp. rate 21, height 5\' 8"  (1.727 m), weight 79.1 kg (174 lb 6.1 oz), SpO2 96.00%.  Physical Exam: General: No acute respiratory distress Lungs: Clear to auscultation bilaterally without wheezes or crackles Cardiovascular: Regular rate and rhythm without murmur gallop or rub-soft 1/6 M Abdomen: Nontender, nondistended, soft, bowel sounds positive, no rebound, no ascites, no appreciable mass Extremities: No significant cyanosis, clubbing, or edema bilateral lower extremities  Lab Results:  Basename 07/20/11 0600 07/19/11 2147 07/19/11 1344  NA 151* 154* 149*  K 3.7 3.3* 3.3*  CL 115* 114* 108  CO2 21 25 25   GLUCOSE 399* 269* 773*  BUN 114* 116* 117*    CREATININE 4.32* 4.71* 4.72*  CALCIUM 8.8 9.3 9.2  MG -- -- --  PHOS -- -- --    Basename 07/20/11 0600  AST 38*  ALT 24  ALKPHOS 90  BILITOT 0.6  PROT 7.5  ALBUMIN 3.1*    Basename 07/20/11 0600 07/19/11 1344 07/19/11 0728 07/19/11 0715  WBC 14.3* 11.7* -- 10.0  NEUTROABS -- -- -- --  HGB 14.6 15.0 17.0 --  HCT 42.2 43.5 50.0 --  MCV 94.0 93.8 -- 99.6  PLT 197 226 -- 254    Basename 07/19/11 2353 07/19/11 1928 07/19/11 1340  CKTOTAL 3263* 2523* 1510*  CKMB 12.3* 12.2* 12.0*  CKMBINDEX -- -- --  TROPONINI 1.47* 1.20* 0.69*    Basename 07/19/11 1514 07/19/11 1344  HGBA1C 12.0* 12.0*    Micro Results: Recent Results (from the past 240 hour(s))  MRSA PCR SCREENING     Status: Normal   Collection Time   07/19/11  1:30 PM      Component Value Range Status Comment   MRSA by PCR NEGATIVE  NEGATIVE  Final     Studies/Results: All recent x-ray/radiology reports have been reviewed in detail.   Medications: I have reviewed the patient's complete medication list.  Assessment/Plan:  Severe Acute on chronic kidney failure Baseline crt noted to be 1.5 in 2010 - a renal US has not yet been accomplished - I feel it is important to r/o hydro/obstruction w/ such  profound acute renal failure and have therefore ordered a renal US - I have ordered for a foley to be placed - I will check a FeNa - at present, he does not have an indication for acute HD - if, however, his renal fxn does not improve soon, I will request a Nephrology consult  Mild rhabdo/elevated CK total Possibly due to simple DH - no hx of trauma - hold cholesterol meds - hydrate - follow CK - keep hydrated   Diabetic hyperosmolar non-ketotic state CBG remains erratic - I will resume the insulin gtt (NOT the DKA protocol) and follow CBGs closely - no sign of DKA at present  Hyperkalemia Resolved - follow trend given ARF  Elevated troponin/abnormal EKG Cardiology is following - no plans for intervention at  this time   Hx of HTN/current relative hypotension I feel the pt remains volume depleted - will continue resuscitation and follow BP trend  Severe DH As noted above   Chronic diastolic CHF/severe LV hypertrophy Well compensated at present   Hyperlipidemia Holding medical tx at this point due to rhabdo  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 - w/u continues as detailed above   Cherene Altes, MD [facility] Office  [phone] Pager [phone]  On-Call/Text Page:      [email or web address]      password [facility]

## 2011-07-20 NOTE — Progress Notes (Signed)
Called to 3300 from pt's nurse to insert coude catheter because of pt's inability to void. Bladder scanned pt for 142 ml.  Unsuccessfully attempted to pass a 74F. Coude catheter at 1145 this am with lubricant.   Pt voided around 100cc bloody urine. At 1430, after bladder scanning pt again for 92 ml, re-attempted to cath using a 14 F. Coude catheter using 2% xylocaine jelly per Dr. Garen Lah order.  Unsuccessful to pass catheter.  Nurse, Sarah informed and Dr. Risa Grill called for urology.

## 2011-07-20 NOTE — Consult Note (Signed)
Urology Consult  Referring physician: Thereasa Solo  Reason for referral: Inability to place foley catheter  History of Present Illness: Robert Chaney is a 61 year old male with a somewhat complicated unclear medical history over the last several days. There is no known urologic history although apparently there had been some concerns that he has had a hard time voiding. He presented with some mental status changes in DKA and as well as acute renal failure. The nursing service has attempted to place several Foley catheters unsuccessfully. Bladder scan has suggested a minimal bladder distention the catheter was felt to be necessary to monitor his output more carefully. He does not have hydronephrosis it does not appear that his acute renal failure is obstructive in nature. We have been asked to assist in placement of a Foley catheter and assessment of his urinary system.  Past Medical History  Diagnosis Date  . Hyperlipidemia     HYPERCHOLESTEROLEMIA  . Anemia   . Diabetes mellitus   . Hypertension     MARKED LEFT VENTRICULAR HYPERTROPHY BY PREVIOUS ECHOCARDIOGRAM--HE HAS HYPERDYNAMIC LEFT VENTRICULAR SYSTOLIC FUNCTION AND HAS IMPAIRED RELAXATION BY ECHO  . Diastolic CHF, chronic     A.  03/2009 Echo: EF 60-65%, Gr II diast dysfxn  . Murmur     A.  Mild AI by 10/10 echo   Past Surgical History  Procedure Date  . No past surgeries     Medications:  Scheduled:   . clopidogrel  75 mg Oral Q breakfast  . enoxaparin  30 mg Subcutaneous Q24H  . influenza  inactive virus vaccine  0.5 mL Intramuscular Tomorrow-1000  . insulin aspart  0-20 Units Subcutaneous TID WC  . insulin aspart  0-5 Units Subcutaneous QHS  . insulin aspart  20 Units Subcutaneous Once  . insulin aspart  20 Units Subcutaneous Once  . insulin aspart  3 Units Subcutaneous TID WC  . insulin glargine  10 Units Subcutaneous Daily  . insulin glargine  10 Units Subcutaneous Once  . insulin glargine  15 Units Subcutaneous Daily  .  insulin glargine  20 Units Subcutaneous QHS  . isosorbide mononitrate  60 mg Oral Daily  . lidocaine   Topical Once  . metoprolol  25 mg Oral BID  . pneumococcal 23 valent vaccine  0.5 mL Intramuscular Tomorrow-1000  . potassium chloride SA      . potassium chloride  40 mEq Oral Once  . DISCONTD: influenza  inactive virus vaccine  0.5 mL Intramuscular Tomorrow-1000  . DISCONTD: insulin aspart  0-15 Units Subcutaneous TID WC  . DISCONTD: insulin aspart  0-5 Units Subcutaneous QHS  . DISCONTD: insulin regular  0-10 Units Intravenous TID WC  . DISCONTD: insulin regular  0-10 Units Intravenous TID WC  . DISCONTD: metoprolol  50 mg Oral BID  . DISCONTD: pneumococcal 23 valent vaccine  0.5 mL Intramuscular Tomorrow-1000    Allergies:  Allergies  Allergen Reactions  . Aspirin     itching    History reviewed. No pertinent family history.  Social History:  reports that he has never smoked. He has never used smokeless tobacco. He reports that he does not drink alcohol or use illicit drugs.  @ROS @  Physical Exam:  Vital signs in last 24 hours: Temp:  [97.6 F (36.4 C)-98.8 F (37.1 C)] 98.3 F (36.8 C) (01/26 1200) Pulse Rate:  [66-84] 68  (01/26 1300) Resp:  [11-26] 14  (01/26 1300) BP: (80-133)/(35-82) 112/67 mmHg (01/26 1300) SpO2:  [96 %-99 %] 98 % (01/26  1300)  Constitutional: Vital signs reviewed. WD WN in NAD Head: Normocephalic and atraumatic   Eyes: PERRL, No scleral icterus.  Neck: Supple No  Gross JVD, mass, thyromegaly, or carotid bruit present.  Cardiovascular: RRR Pulmonary/Chest: Normal effort Abdominal: Soft. Non-tender, non-distended, bowel sounds are normal, no masses, organomegaly, or guarding present. Bladder not grossly distended. Genitourinary:Nl penis. Scrotum/ testis ok bilaterally. Extremities: No cyanosis or edema  Neurological: Grossly non-focal.  Skin: Warm,very dry and intact. No rash, cyanosis   Laboratory Data:  Results for orders placed  during the hospital encounter of 07/19/11 (from the past 72 hour(s))  GLUCOSE, CAPILLARY     Status: Abnormal   Collection Time   07/19/11  6:56 AM      Component Value Range Comment   Glucose-Capillary >600 (*) 70 - 99 (mg/dL)   CARDIAC PANEL(CRET KIN+CKTOT+MB+TROPI)     Status: Abnormal   Collection Time   07/19/11  7:15 AM      Component Value Range Comment   Total CK 1127 (*) 7 - 232 (U/L)    CK, MB 12.5 (*) 0.3 - 4.0 (ng/mL)    Troponin I 0.35 (*) <0.30 (ng/mL)    Relative Index 1.1  0.0 - 2.5    CBC     Status: Normal   Collection Time   07/19/11  7:15 AM      Component Value Range Comment   WBC 10.0  4.0 - 10.5 (K/uL)    RBC 4.76  4.22 - 5.81 (MIL/uL)    Hemoglobin 15.4  13.0 - 17.0 (g/dL)    HCT 47.4  39.0 - 52.0 (%)    MCV 99.6  78.0 - 100.0 (fL)    MCH 32.4  26.0 - 34.0 (pg)    MCHC 32.5  30.0 - 36.0 (g/dL)    RDW 12.9  11.5 - 15.5 (%)    Platelets 254  150 - 400 (K/uL)   PRO B NATRIURETIC PEPTIDE     Status: Abnormal   Collection Time   07/19/11  7:15 AM      Component Value Range Comment   Pro B Natriuretic peptide (BNP) 1915.0 (*) 0 - 125 (pg/mL)   POCT I-STAT TROPONIN I     Status: Abnormal   Collection Time   07/19/11  7:25 AM      Component Value Range Comment   Troponin i, poc 0.26 (*) 0.00 - 0.08 (ng/mL)    Comment NOTIFIED PHYSICIAN      Comment 3            POCT I-STAT, CHEM 8     Status: Abnormal   Collection Time   07/19/11  7:28 AM      Component Value Range Comment   Sodium 138  135 - 145 (mEq/L)    Potassium 6.0 (*) 3.5 - 5.1 (mEq/L)    Chloride 107  96 - 112 (mEq/L)    BUN 121 (*) 6 - 23 (mg/dL)    Creatinine, Ser 4.60 (*) 0.50 - 1.35 (mg/dL)    Glucose, Bld >700 (*) 70 - 99 (mg/dL)    Calcium, Ion 1.11 (*) 1.12 - 1.32 (mmol/L)    TCO2 21  0 - 100 (mmol/L)    Hemoglobin 17.0  13.0 - 17.0 (g/dL)    HCT 50.0  39.0 - 52.0 (%)    Comment NOTIFIED PHYSICIAN     BLOOD GAS, ARTERIAL     Status: Abnormal   Collection Time   07/19/11  7:45 AM  Component Value Range Comment   FIO2 0.21      pH, Arterial 7.259 (*) 7.350 - 7.450     pCO2 arterial 43.7  35.0 - 45.0 (mmHg)    pO2, Arterial 77.4 (*) 80.0 - 100.0 (mmHg)    Bicarbonate 18.9 (*) 20.0 - 24.0 (mEq/L)    TCO2 17.2  0 - 100 (mmol/L)    Acid-base deficit 7.6 (*) 0.0 - 2.0 (mmol/L)    O2 Saturation 92.9      Patient temperature 98.6      Collection site RIGHT RADIAL      Drawn by [phone]      Sample type ARTERIAL DRAW      Allens test (pass/fail) PASS  PASS    GLUCOSE, CAPILLARY     Status: Abnormal   Collection Time   07/19/11  9:01 AM      Component Value Range Comment   Glucose-Capillary >600 (*) 70 - 99 (mg/dL)    Comment 1 Notify RN      Comment 2 Documented in Chart     GLUCOSE, CAPILLARY     Status: Abnormal   Collection Time   07/19/11 10:06 AM      Component Value Range Comment   Glucose-Capillary >600 (*) 70 - 99 (mg/dL)    Comment 1 Notify RN      Comment 2 Documented in Chart     GLUCOSE, CAPILLARY     Status: Abnormal   Collection Time   07/19/11 11:22 AM      Component Value Range Comment   Glucose-Capillary >600 (*) 70 - 99 (mg/dL)   GLUCOSE, CAPILLARY     Status: Abnormal   Collection Time   07/19/11 12:12 PM      Component Value Range Comment   Glucose-Capillary >600 (*) 70 - 99 (mg/dL)    Comment 1 Documented in Chart      Comment 2 Notify RN     MRSA PCR SCREENING     Status: Normal   Collection Time   07/19/11  1:30 PM      Component Value Range Comment   MRSA by PCR NEGATIVE  NEGATIVE    URINALYSIS, ROUTINE W REFLEX MICROSCOPIC     Status: Abnormal   Collection Time   07/19/11  1:35 PM      Component Value Range Comment   Color, Urine YELLOW  YELLOW     APPearance CLEAR  CLEAR     Specific Gravity, Urine 1.025  1.005 - 1.030     pH 5.0  5.0 - 8.0     Glucose, UA >1000 (*) NEGATIVE (mg/dL)    Hgb urine dipstick SMALL (*) NEGATIVE     Bilirubin Urine SMALL (*) NEGATIVE     Ketones, ur NEGATIVE  NEGATIVE (mg/dL)    Protein, ur NEGATIVE   NEGATIVE (mg/dL)    Urobilinogen, UA 1.0  0.0 - 1.0 (mg/dL)    Nitrite NEGATIVE  NEGATIVE     Leukocytes, UA TRACE (*) NEGATIVE    URINE MICROSCOPIC-ADD ON     Status: Abnormal   Collection Time   07/19/11  1:35 PM      Component Value Range Comment   Squamous Epithelial / LPF FEW (*) RARE     WBC, UA 7-10  <3 (WBC/hpf)    RBC / HPF 3-6  <3 (RBC/hpf)    Bacteria, UA RARE  RARE     Casts HYALINE CASTS (*) NEGATIVE     Urine-Other TRICHOMONAS PRESENT  CARDIAC PANEL(CRET KIN+CKTOT+MB+TROPI)     Status: Abnormal   Collection Time   07/19/11  1:40 PM      Component Value Range Comment   Total CK 1510 (*) 7 - 232 (U/L)    CK, MB 12.0 (*) 0.3 - 4.0 (ng/mL)    Troponin I 0.69 (*) <0.30 (ng/mL)    Relative Index 0.8  0.0 - 2.5    CBC     Status: Abnormal   Collection Time   07/19/11  1:44 PM      Component Value Range Comment   WBC 11.7 (*) 4.0 - 10.5 (K/uL)    RBC 4.64  4.22 - 5.81 (MIL/uL)    Hemoglobin 15.0  13.0 - 17.0 (g/dL)    HCT 43.5  39.0 - 52.0 (%)    MCV 93.8  78.0 - 100.0 (fL)    MCH 32.3  26.0 - 34.0 (pg)    MCHC 34.5  30.0 - 36.0 (g/dL)    RDW 12.6  11.5 - 15.5 (%)    Platelets 226  150 - 400 (K/uL)   BASIC METABOLIC PANEL     Status: Abnormal   Collection Time   07/19/11  1:44 PM      Component Value Range Comment   Sodium 149 (*) 135 - 145 (mEq/L)    Potassium 3.3 (*) 3.5 - 5.1 (mEq/L)    Chloride 108  96 - 112 (mEq/L)    CO2 25  19 - 32 (mEq/L)    Glucose, Bld 773 (*) 70 - 99 (mg/dL)    BUN 117 (*) 6 - 23 (mg/dL)    Creatinine, Ser 4.72 (*) 0.50 - 1.35 (mg/dL)    Calcium 9.2  8.4 - 10.5 (mg/dL)    GFR calc non Af Amer 12 (*) >90 (mL/min)    GFR calc Af Amer 14 (*) >90 (mL/min)   HEMOGLOBIN A1C     Status: Abnormal   Collection Time   07/19/11  1:44 PM      Component Value Range Comment   Hemoglobin A1C 12.0 (*) <5.7 (%)    Mean Plasma Glucose 298 (*) <117 (mg/dL)   GLUCOSE, CAPILLARY     Status: Abnormal   Collection Time   07/19/11  2:08 PM       Component Value Range Comment   Glucose-Capillary >600 (*) 70 - 99 (mg/dL)   HEMOGLOBIN A1C     Status: Abnormal   Collection Time   07/19/11  3:14 PM      Component Value Range Comment   Hemoglobin A1C 12.0 (*) <5.7 (%)    Mean Plasma Glucose 298 (*) <117 (mg/dL)   GLUCOSE, CAPILLARY     Status: Abnormal   Collection Time   07/19/11  3:19 PM      Component Value Range Comment   Glucose-Capillary 506 (*) 70 - 99 (mg/dL)    Comment 1 Documented in Chart      Comment 2 Notify RN     GLUCOSE, CAPILLARY     Status: Abnormal   Collection Time   07/19/11  4:23 PM      Component Value Range Comment   Glucose-Capillary 450 (*) 70 - 99 (mg/dL)    Comment 1 Documented in Chart      Comment 2 Notify RN     GLUCOSE, CAPILLARY     Status: Abnormal   Collection Time   07/19/11  5:26 PM      Component Value Range Comment   Glucose-Capillary 362 (*)  70 - 99 (mg/dL)    Comment 1 Documented in Chart      Comment 2 Notify RN     GLUCOSE, CAPILLARY     Status: Abnormal   Collection Time   07/19/11  6:30 PM      Component Value Range Comment   Glucose-Capillary 316 (*) 70 - 99 (mg/dL)    Comment 1 Documented in Chart      Comment 2 Notify RN     CARDIAC PANEL(CRET KIN+CKTOT+MB+TROPI)     Status: Abnormal   Collection Time   07/19/11  7:28 PM      Component Value Range Comment   Total CK 2523 (*) 7 - 232 (U/L)    CK, MB 12.2 (*) 0.3 - 4.0 (ng/mL) CRITICAL VALUE NOTED.  VALUE IS CONSISTENT WITH PREVIOUSLY REPORTED AND CALLED VALUE.   Troponin I 1.20 (*) <0.30 (ng/mL)    Relative Index 0.5  0.0 - 2.5    GLUCOSE, CAPILLARY     Status: Abnormal   Collection Time   07/19/11  7:33 PM      Component Value Range Comment   Glucose-Capillary 249 (*) 70 - 99 (mg/dL)   GLUCOSE, CAPILLARY     Status: Abnormal   Collection Time   07/19/11  8:39 PM      Component Value Range Comment   Glucose-Capillary 212 (*) 70 - 99 (mg/dL)   GLUCOSE, CAPILLARY     Status: Abnormal   Collection Time   07/19/11  9:42 PM        Component Value Range Comment   Glucose-Capillary 211 (*) 70 - 99 (mg/dL)   BASIC METABOLIC PANEL     Status: Abnormal   Collection Time   07/19/11  9:47 PM      Component Value Range Comment   Sodium 154 (*) 135 - 145 (mEq/L)    Potassium 3.3 (*) 3.5 - 5.1 (mEq/L)    Chloride 114 (*) 96 - 112 (mEq/L)    CO2 25  19 - 32 (mEq/L)    Glucose, Bld 269 (*) 70 - 99 (mg/dL)    BUN 116 (*) 6 - 23 (mg/dL)    Creatinine, Ser 4.71 (*) 0.50 - 1.35 (mg/dL)    Calcium 9.3  8.4 - 10.5 (mg/dL)    GFR calc non Af Amer 12 (*) >90 (mL/min)    GFR calc Af Amer 14 (*) >90 (mL/min)   GLUCOSE, CAPILLARY     Status: Abnormal   Collection Time   07/19/11 10:45 PM      Component Value Range Comment   Glucose-Capillary 158 (*) 70 - 99 (mg/dL)    Comment 1 Notify RN      Comment 2 Documented in Chart     CARDIAC PANEL(CRET KIN+CKTOT+MB+TROPI)     Status: Abnormal   Collection Time   07/19/11 11:53 PM      Component Value Range Comment   Total CK 3263 (*) 7 - 232 (U/L)    CK, MB 12.3 (*) 0.3 - 4.0 (ng/mL) CRITICAL VALUE NOTED.  VALUE IS CONSISTENT WITH PREVIOUSLY REPORTED AND CALLED VALUE.   Troponin I 1.47 (*) <0.30 (ng/mL)    Relative Index 0.4  0.0 - 2.5    GLUCOSE, CAPILLARY     Status: Abnormal   Collection Time   07/20/11 12:25 AM      Component Value Range Comment   Glucose-Capillary 191 (*) 70 - 99 (mg/dL)    Comment 1 Notify RN  Comment 2 Documented in Chart     GLUCOSE, CAPILLARY     Status: Abnormal   Collection Time   07/20/11  4:15 AM      Component Value Range Comment   Glucose-Capillary 444 (*) 70 - 99 (mg/dL)    Comment 1 Notify RN      Comment 2 Documented in Chart     GLUCOSE, CAPILLARY     Status: Abnormal   Collection Time   07/20/11  5:58 AM      Component Value Range Comment   Glucose-Capillary 472 (*) 70 - 99 (mg/dL)    Comment 1 Notify RN      Comment 2 Documented in Chart     CBC     Status: Abnormal   Collection Time   07/20/11  6:00 AM      Component Value  Range Comment   WBC 14.3 (*) 4.0 - 10.5 (K/uL)    RBC 4.49  4.22 - 5.81 (MIL/uL)    Hemoglobin 14.6  13.0 - 17.0 (g/dL)    HCT 42.2  39.0 - 52.0 (%)    MCV 94.0  78.0 - 100.0 (fL)    MCH 32.5  26.0 - 34.0 (pg)    MCHC 34.6  30.0 - 36.0 (g/dL)    RDW 12.7  11.5 - 15.5 (%)    Platelets 197  150 - 400 (K/uL)   COMPREHENSIVE METABOLIC PANEL     Status: Abnormal   Collection Time   07/20/11  6:00 AM      Component Value Range Comment   Sodium 151 (*) 135 - 145 (mEq/L)    Potassium 3.7  3.5 - 5.1 (mEq/L)    Chloride 115 (*) 96 - 112 (mEq/L)    CO2 21  19 - 32 (mEq/L)    Glucose, Bld 399 (*) 70 - 99 (mg/dL)    BUN 114 (*) 6 - 23 (mg/dL)    Creatinine, Ser 4.32 (*) 0.50 - 1.35 (mg/dL)    Calcium 8.8  8.4 - 10.5 (mg/dL)    Total Protein 7.5  6.0 - 8.3 (g/dL)    Albumin 3.1 (*) 3.5 - 5.2 (g/dL)    AST 38 (*) 0 - 37 (U/L)    ALT 24  0 - 53 (U/L)    Alkaline Phosphatase 90  39 - 117 (U/L)    Total Bilirubin 0.6  0.3 - 1.2 (mg/dL)    GFR calc non Af Amer 13 (*) >90 (mL/min)    GFR calc Af Amer 16 (*) >90 (mL/min)   PRO B NATRIURETIC PEPTIDE     Status: Abnormal   Collection Time   07/20/11  6:00 AM      Component Value Range Comment   Pro B Natriuretic peptide (BNP) 1493.0 (*) 0 - 125 (pg/mL)   GLUCOSE, CAPILLARY     Status: Abnormal   Collection Time   07/20/11  8:30 AM      Component Value Range Comment   Glucose-Capillary 275 (*) 70 - 99 (mg/dL)    Comment 1 Documented in Chart      Comment 2 Notify RN     GLUCOSE, CAPILLARY     Status: Abnormal   Collection Time   07/20/11 10:29 AM      Component Value Range Comment   Glucose-Capillary 159 (*) 70 - 99 (mg/dL)    Comment 1 Notify RN     GLUCOSE, CAPILLARY     Status: Abnormal   Collection Time   07/20/11  11:30 AM      Component Value Range Comment   Glucose-Capillary 141 (*) 70 - 99 (mg/dL)    Comment 1 Documented in Chart      Comment 2 Notify RN     GLUCOSE, CAPILLARY     Status: Abnormal   Collection Time   07/20/11 12:39  PM      Component Value Range Comment   Glucose-Capillary 122 (*) 70 - 99 (mg/dL)    Comment 1 Notify RN     CARDIAC PANEL(CRET KIN+CKTOT+MB+TROPI)     Status: Abnormal   Collection Time   07/20/11 12:40 PM      Component Value Range Comment   Total CK 3429 (*) 7 - 232 (U/L)    CK, MB 12.0 (*) 0.3 - 4.0 (ng/mL) CRITICAL VALUE NOTED.  VALUE IS CONSISTENT WITH PREVIOUSLY REPORTED AND CALLED VALUE.   Troponin I 1.41 (*) <0.30 (ng/mL)    Relative Index 0.3  0.0 - 2.5    CK     Status: Abnormal   Collection Time   07/20/11 12:45 PM      Component Value Range Comment   Total CK 3436 (*) 7 - 232 (U/L)   BASIC METABOLIC PANEL     Status: Abnormal   Collection Time   07/20/11 12:45 PM      Component Value Range Comment   Sodium 155 (*) 135 - 145 (mEq/L)    Potassium 3.5  3.5 - 5.1 (mEq/L)    Chloride 120 (*) 96 - 112 (mEq/L)    CO2 24  19 - 32 (mEq/L)    Glucose, Bld 101 (*) 70 - 99 (mg/dL)    BUN 109 (*) 6 - 23 (mg/dL)    Creatinine, Ser 4.13 (*) 0.50 - 1.35 (mg/dL)    Calcium 8.6  8.4 - 10.5 (mg/dL)    GFR calc non Af Amer 14 (*) >90 (mL/min)    GFR calc Af Amer 16 (*) >90 (mL/min)    Recent Results (from the past 240 hour(s))  MRSA PCR SCREENING     Status: Normal   Collection Time   07/19/11  1:30 PM      Component Value Range Status Comment   MRSA by PCR NEGATIVE  NEGATIVE  Final    Creatinine:  Basename 07/20/11 1245 07/20/11 0600 07/19/11 2147 07/19/11 1344 07/19/11 0728  CREATININE 4.13* 4.32* 4.71* 4.72* 4.60*   Baseline Creatinine: 1.5  Impression/Assessment:   Given the multiple attempts at catheter insertion we felt that flexible cystoscopy at the bedside should be performed. The patient was prepped and draped in usual manner and lidocaine jelly was instilled per urethra. A flexible urethroscopy the patient clearly had evidence of a bulbar urethral stricture with some resulting trauma from prior catheter at times. I was unable to pass the flexible cystoscope beyond the  stricture. A guidewire was placed through the flexible cystoscope and through the area of narrowing. The flexible cystoscope was then removed and the urethra was dilated from 14-20 [place]. Over the guidewire I then placed an 18 [place] council tip Foley catheter with return of several 100 cc of concentrated appearing urine.  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 His bladder however was not grossly distended and he had no evidence of hydronephrosis and I suspect his acute renal failure is not secondary to an obstructive process. At this point I would recommend that the Foley catheter remain indwelling for at least 48-72 hours status post dilation. He  can then be removed when clinically indicated. The patient should have substantial improvement in his overall voiding status post his urethral dilation. Urethral stricture disease however is often recurrent and I would not be surprised if his voiding does worsen over time. He should followup with urology on a nonurgent basis. Please reconsult if we can be of further assistance.  Cailee Blanke S 07/20/2011, 4:48 PM

## 2011-07-21 LAB — CBC
HCT: 38.7 % — ABNORMAL LOW (ref 39.0–52.0)
Hemoglobin: 12.5 g/dL — ABNORMAL LOW (ref 13.0–17.0)
MCV: 96.8 fL (ref 78.0–100.0)
RDW: 12.8 % (ref 11.5–15.5)
WBC: 10 10*3/uL (ref 4.0–10.5)

## 2011-07-21 LAB — BASIC METABOLIC PANEL
CO2: 23 mEq/L (ref 19–32)
Chloride: 122 mEq/L — ABNORMAL HIGH (ref 96–112)
Creatinine, Ser: 2.76 mg/dL — ABNORMAL HIGH (ref 0.50–1.35)
Potassium: 3.8 mEq/L (ref 3.5–5.1)

## 2011-07-21 LAB — GLUCOSE, CAPILLARY
Glucose-Capillary: 403 mg/dL — ABNORMAL HIGH (ref 70–99)
Glucose-Capillary: 309 mg/dL — ABNORMAL HIGH (ref 70–99)

## 2011-07-21 LAB — CARDIAC PANEL(CRET KIN+CKTOT+MB+TROPI)
CK, MB: 8.9 ng/mL (ref 0.3–4.0)
Total CK: 1179 U/L — ABNORMAL HIGH (ref 7–232)
Troponin I: 0.74 ng/mL (ref <0.30)

## 2011-07-21 MED ORDER — SODIUM CHLORIDE 0.45 % IV SOLN
INTRAVENOUS | Status: DC
  Administered 2011-07-21 – 2011-07-23 (×3): via INTRAVENOUS

## 2011-07-21 MED ORDER — INSULIN ASPART 100 UNIT/ML ~~LOC~~ SOLN
6.0000 [IU] | Freq: Three times a day (TID) | SUBCUTANEOUS | Status: DC
  Administered 2011-07-21 – 2011-07-23 (×6): 6 [IU] via SUBCUTANEOUS

## 2011-07-21 MED ORDER — LIVING WELL WITH DIABETES BOOK
Freq: Once | Status: AC
  Administered 2011-07-21: 18:00:00
  Filled 2011-07-21: qty 1

## 2011-07-21 MED ORDER — METOPROLOL TARTRATE 50 MG PO TABS
50.0000 mg | ORAL_TABLET | Freq: Two times a day (BID) | ORAL | Status: DC
  Filled 2011-07-21: qty 1

## 2011-07-21 MED ORDER — INSULIN GLARGINE 100 UNIT/ML ~~LOC~~ SOLN
24.0000 [IU] | Freq: Every day | SUBCUTANEOUS | Status: DC
  Administered 2011-07-21: 24 [IU] via SUBCUTANEOUS

## 2011-07-21 MED ORDER — METOPROLOL TARTRATE 100 MG PO TABS
100.0000 mg | ORAL_TABLET | Freq: Two times a day (BID) | ORAL | Status: DC
  Administered 2011-07-21: 100 mg via ORAL
  Filled 2011-07-21: qty 1

## 2011-07-21 NOTE — Plan of Care (Signed)
Problem: Phase I Progression Outcomes Goal: OOB as tolerated unless otherwise ordered Outcome: Completed/Met Date Met:  07/21/11 OOB to chair

## 2011-07-21 NOTE — Progress Notes (Signed)
[facility] [place] TEAM 8  Subjective: The patient is a 61 year old male originally from [place] who works as a Quarry manager at [employer] with past medical history significant for hypertension and diastolic CHF followed by Dr. Mare Ferrari who presents with complaints of generalized weakness.  He states that he's not had anything to eat for the past 2 days.  He was seen in the ED and labwork revealed a potassium of 6.0 with a blood glucose of greater than 700, BUN of 121 and creatinine of 4.6, his last creatinine are in 2010 was 1.5.  Cardiac enzymes were done and his troponin was noted to be elevated at 0.26.  An EKG was done and showed normal sinus rhythm with diffuse T-wave inversions.  The patient is markedly improved today.  He is much more alert and interactive.  He denies any complaints whatsoever specifically stating that he does not have chest pain shortness of breath fevers chills nausea vomiting or abdominal pain.  Objective: Weight change:   Intake/Output Summary (Last 24 hours) at 07/21/11 1224 Last data filed at 07/21/11 0900  Gross per 24 hour  Intake   2400 ml  Output   1250 ml  Net   1150 ml   Blood pressure 141/62, pulse 64, temperature 98.2 F (36.8 C), temperature source Oral, resp. rate 16, height 5\' 8"  (1.727 m), weight 79.1 kg (174 lb 6.1 oz), SpO2 100.00%.  Physical Exam: General: No acute respiratory distress Lungs: Clear to auscultation bilaterally without wheezes or crackles Cardiovascular: Regular rate and rhythm without murmur gallop or rub Abdomen: Nontender, nondistended, soft, bowel sounds positive, no rebound, no ascites, no appreciable mass Extremities: No significant cyanosis, clubbing, or edema bilateral lower extremities GU: Foley catheter in place  Lab Results:  Basename 07/21/11 0436 07/20/11 1245 07/20/11 0600  NA 155* 155* 151*  K 3.8 3.5 3.7  CL 122* 120* 115*  CO2 23 24 21   GLUCOSE 155* 101* 399*  BUN 97* 109* 114*    CREATININE 2.76* 4.13* 4.32*  CALCIUM 8.3* 8.6 8.8  MG -- -- --  PHOS -- -- --    Basename 07/20/11 0600  AST 38*  ALT 24  ALKPHOS 90  BILITOT 0.6  PROT 7.5  ALBUMIN 3.1*    Basename 07/21/11 0436 07/20/11 0600 07/19/11 1344  WBC 10.0 14.3* 11.7*  NEUTROABS -- -- --  HGB 12.5* 14.6 15.0  HCT 38.7* 42.2 43.5  MCV 96.8 94.0 93.8  PLT 150 197 226    Basename 07/20/11 1245 07/20/11 1240 07/19/11 2353 07/19/11 1928  CKTOTAL 3436* 3429* 3263* --  CKMB -- 12.0* 12.3* 12.2*  CKMBINDEX -- -- -- --  TROPONINI -- 1.41* 1.47* 1.20*    Basename 07/19/11 1514 07/19/11 1344  HGBA1C 12.0* 12.0*    Micro Results: Recent Results (from the past 240 hour(s))  MRSA PCR SCREENING     Status: Normal   Collection Time   07/19/11  1:30 PM      Component Value Range Status Comment   MRSA by PCR NEGATIVE  NEGATIVE  Final     Studies/Results: All recent x-ray/radiology reports have been reviewed in detail.   Medications: I have reviewed the patient's complete medication list.  Assessment/Plan:  Severe Acute on chronic kidney failure Baseline crt noted to be 1.5 in 2010 - a renal US was without evidence of hydronephrosis/obstruction - FeNa is consistent with a prerenal azotemia - his renal function has improved dramatically with volume resuscitation as has  his mental status - continue to follow with ongoing volume resuscitation  Hypernatremia This is most consistent with a hypovolemic hypernatremia-we will continue to resuscitate with crystalloid mildly hypotonic solution and follow his sodium trend  Mild rhabdo/elevated CK total Possibly due to simple DH - no hx of trauma - hold cholesterol meds - hydrate - recheck CK total in a.m.  Diabetic hyperosmolar non-ketotic state  Office notes from Aug 2012 note that the pt has a known hx of DM - it would appear that his DM was "diet controlled" prior to his admit - the pt is now off the insulin gtt - CBGs are much improved, but not yet at  our goal - I will titrate his meds further and we will cont DM education  Hyperkalemia Resolved - follow trend given ARF  Elevated troponin/abnormal EKG Cardiology is following - no plans for intervention at this time - CEs were ordered for this morning - it is not clear to me why they are not available at this time   Hx of HTN/current relative hypotension I feel the pt remains volume depleted - will continue resuscitation and follow BP trend  Severe DH As noted above, improving, but still needs added volume  bulbar urethral stricture disease See note as per Urology - foley to remain for 72hrs then will need outpt fu w/ Urology at some poin in the future  Chronic diastolic CHF/severe LV hypertrophy Well compensated at present - BB being titrated  Hyperlipidemia Holding medical tx at this point due to rhabdo  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

## 2011-07-21 NOTE — Progress Notes (Signed)
Robert Chaney is a 61 y.o. male patient who transferred  from 39 awake, alert  & orientated  X 3, VSS - Blood pressure 118/62, pulse 71, temperature 98.4 F (36.9 C), temperature source Oral, resp. rate 18, height 5\' 8"  (1.727 m), weight 79.1 kg (174 lb 6.1 oz), SpO2 94.00%. RA, no c/o shortness of breath, no c/o chest pain, no distress noted. Tele #[id number] placed and pt is currently running:normal sinus rhythm.   IV site WDL: wrist left, condition patent and no redness with a transparent dsg that's clean dry and intact.  Allergies:   Allergies  Allergen Reactions  . Aspirin     itching     Past Medical History  Diagnosis Date  . Hyperlipidemia     HYPERCHOLESTEROLEMIA  . Anemia   . Diabetes mellitus   . Hypertension     MARKED LEFT VENTRICULAR HYPERTROPHY BY PREVIOUS ECHOCARDIOGRAM--HE HAS HYPERDYNAMIC LEFT VENTRICULAR SYSTOLIC FUNCTION AND HAS IMPAIRED RELAXATION BY ECHO  . Diastolic CHF, chronic     A.  03/2009 Echo: EF 60-65%, Gr II diast dysfxn  . Murmur     A.  Mild AI by 10/10 echo    Pt orientation to unit, room and routine. OOB in chair with chair alarm in use, fall risk assessment complete with  family verbalizing understanding of risks associated with falls. Pt verbalizes an understanding of how to use the call bell and to call for help before getting out of bed.  Skin, clean-dry- intact without evidence of bruising, or skin tears.   No evidence of skin break down noted on exam  Will cont to monitor and assist as needed.  Darreld Mclean [facility], RN 07/21/2011 3:50 PM

## 2011-07-21 NOTE — Progress Notes (Signed)
Patient ID: Robert Chaney, male   DOB: 08/06/1950, 61 y.o.   MRN: [id number]     SUBJECTIVE: No chest pain or dyspnea.  Creatinine improving.      . clopidogrel  75 mg Oral Q breakfast  . enoxaparin  30 mg Subcutaneous Q24H  . influenza  inactive virus vaccine  0.5 mL Intramuscular Tomorrow-1000  . insulin aspart  0-20 Units Subcutaneous TID WC  . insulin aspart  0-5 Units Subcutaneous QHS  . insulin aspart  3 Units Subcutaneous TID WC  . insulin glargine  10 Units Subcutaneous Once  . insulin glargine  20 Units Subcutaneous QHS  . isosorbide mononitrate  60 mg Oral Daily  . lidocaine   Topical Once  . metoprolol  25 mg Oral BID  . pneumococcal 23 valent vaccine  0.5 mL Intramuscular Tomorrow-1000  . DISCONTD: insulin regular  0-10 Units Intravenous TID WC  1/2 NS at 100 cc/hr    Filed Vitals:   07/21/11 0330 07/21/11 0400 07/21/11 0500 07/21/11 0700  BP: 152/72  104/68 141/62  Pulse: 75 81  64  Temp: 98.6 F (37 C)   98.2 F (36.8 C)  TempSrc: Oral   Oral  Resp: 20 20  16   Height:      Weight:      SpO2: 98% 98%  100%    Intake/Output Summary (Last 24 hours) at 07/21/11 1136 Last data filed at 07/21/11 0900  Gross per 24 hour  Intake   2400 ml  Output   1250 ml  Net   1150 ml    LABS: Basic Metabolic Panel:  Basename 07/21/11 0436 07/20/11 1245  NA 155* 155*  K 3.8 3.5  CL 122* 120*  CO2 23 24  GLUCOSE 155* 101*  BUN 97* 109*  CREATININE 2.76* 4.13*  CALCIUM 8.3* 8.6  MG -- --  PHOS -- --   Liver Function Tests:  Basename 07/20/11 0600  AST 38*  ALT 24  ALKPHOS 90  BILITOT 0.6  PROT 7.5  ALBUMIN 3.1*   No results found for this basename: LIPASE:2,AMYLASE:2 in the last 72 hours CBC:  Basename 07/21/11 0436 07/20/11 0600  WBC 10.0 14.3*  NEUTROABS -- --  HGB 12.5* 14.6  HCT 38.7* 42.2  MCV 96.8 94.0  PLT 150 197   Cardiac Enzymes:  Basename 07/20/11 1245 07/20/11 1240 07/19/11 2353 07/19/11 1928  CKTOTAL 3436* 3429* 3263* --  CKMB --  12.0* 12.3* 12.2*  CKMBINDEX -- -- -- --  TROPONINI -- 1.41* 1.47* 1.20*   BNP: No components found with this basename: POCBNP:3 D-Dimer: No results found for this basename: DDIMER:2 in the last 72 hours Hemoglobin A1C:  Basename 07/19/11 1514  HGBA1C 12.0*   Fasting Lipid Panel: No results found for this basename: CHOL,HDL,LDLCALC,TRIG,CHOLHDL,LDLDIRECT in the last 72 hours Thyroid Function Tests: No results found for this basename: TSH,T4TOTAL,FREET3,T3FREE,THYROIDAB in the last 72 hours Anemia Panel:  Basename 07/20/11 1245  VITAMINB12 1589*  FOLATE 11.0  FERRITIN --  TIBC --  IRON --  RETICCTPCT --    RADIOLOGY: Dg Chest 2 View  07/19/2011  *RADIOLOGY REPORT*  Clinical Data: Shortness of breath.  High blood pressure. Diabetic.  Nonsmoker.  CHEST - 2 VIEW  Comparison: 04/17/2009.  Findings: Cardiomegaly.  Pulmonary vascular congestion.  Tortuous aorta.  Shoulder joint degenerative changes.  IMPRESSION: Cardiomegaly.  Pulmonary vascular congestion most notable centrally.  Tortuous aorta.  Original Report Authenticated By: Doug Sou, M.D.    PHYSICAL EXAM General: NAD  Neck: No JVD, no thyromegaly or thyroid nodule.  Lungs: Clear to auscultation bilaterally with normal respiratory effort. CV: Nondisplaced PMI.  Heart regular S1/S2, no [id number], 1/6 systolic murmur upper sternal border.  No peripheral edema.  No carotid bruit.  Normal pedal pulses.  Abdomen: Soft, nontender, no hepatosplenomegaly, no distention.  Neurologic: Alert and oriented x 3.  Psych: Normal affect. Extremities: No clubbing or cyanosis.   TELEMETRY: Reviewed telemetry pt in NSR:  ASSESSMENT AND PLAN: 61 yo admitted with hyperglycemic hyperosmolar state, denies prior knowledge of diabetes.  He was noted to be in ARF with creatinine > 4.  He had CK elevated to > 3000, TnI to 1.47.  No chest pain.  Echo with normal EF, severe LVH with some LVOT obstruction and mitral valve SAM.  1. Elevated  troponin: In setting of ARF and hyperosmolar nonketotic state.  Has risk factors for CAD.  No chest pain.  Highest suspicion for demand ischemia.   - Continue Plavix (ASA allergy), beta blocker.  - Statin when CK comes down.  - Should eventually get functional study. 2.  Diastolic CHF: Does not appear volume overloaded.  Has long-standing HTN with severe LVH.  Given dynamic LV obstruction, will increase metoprolol to 50 mg bid.  3. ARF: Suspect primarily due to dehydration with HHS.  CPK not so high that would expect severe ARF from this elevation alone. Getting IV fluid, creatinine improving. 4. Hypernatremia: Getting 1/2 NS.  Per medicine.     Loralie Champagne 07/21/2011 11:36 AM

## 2011-07-21 NOTE — Progress Notes (Signed)
Report given and pt transferred to [id number].

## 2011-07-21 NOTE — Progress Notes (Signed)
Dr Thereasa Solo informed of CBG of 403.  Will give 20 units novolog per SSC.  Pt condition unchanged.

## 2011-07-22 DIAGNOSIS — I517 Cardiomegaly: Secondary | ICD-10-CM

## 2011-07-22 DIAGNOSIS — R5381 Other malaise: Secondary | ICD-10-CM

## 2011-07-22 LAB — BASIC METABOLIC PANEL
CO2: 24 mEq/L (ref 19–32)
Chloride: 114 mEq/L — ABNORMAL HIGH (ref 96–112)
Glucose, Bld: 291 mg/dL — ABNORMAL HIGH (ref 70–99)
Potassium: 4.2 mEq/L (ref 3.5–5.1)
Sodium: 146 mEq/L — ABNORMAL HIGH (ref 135–145)

## 2011-07-22 LAB — GLUCOSE, CAPILLARY
Glucose-Capillary: 166 mg/dL — ABNORMAL HIGH (ref 70–99)
Glucose-Capillary: 237 mg/dL — ABNORMAL HIGH (ref 70–99)
Glucose-Capillary: 279 mg/dL — ABNORMAL HIGH (ref 70–99)
Glucose-Capillary: 204 mg/dL — ABNORMAL HIGH (ref 70–99)

## 2011-07-22 LAB — CBC
HCT: 38.7 % — ABNORMAL LOW (ref 39.0–52.0)
Hemoglobin: 12.2 g/dL — ABNORMAL LOW (ref 13.0–17.0)
MCH: 31.4 pg (ref 26.0–34.0)
MCV: 99.5 fL (ref 78.0–100.0)
Platelets: 141 10*3/uL — ABNORMAL LOW (ref 150–400)
RBC: 3.89 MIL/uL — ABNORMAL LOW (ref 4.22–5.81)
WBC: 7.6 10*3/uL (ref 4.0–10.5)

## 2011-07-22 LAB — CARDIAC PANEL(CRET KIN+CKTOT+MB+TROPI)
Relative Index: 1.7 (ref 0.0–2.5)
Troponin I: 0.45 ng/mL (ref <0.30)

## 2011-07-22 MED ORDER — INSULIN GLARGINE 100 UNIT/ML ~~LOC~~ SOLN
30.0000 [IU] | Freq: Every day | SUBCUTANEOUS | Status: DC
  Administered 2011-07-22 – 2011-07-23 (×2): 30 [IU] via SUBCUTANEOUS

## 2011-07-22 MED ORDER — METOPROLOL TARTRATE 100 MG PO TABS
100.0000 mg | ORAL_TABLET | Freq: Two times a day (BID) | ORAL | Status: DC
  Administered 2011-07-22 – 2011-07-24 (×5): 100 mg via ORAL
  Filled 2011-07-22 (×8): qty 1

## 2011-07-22 NOTE — Progress Notes (Signed)
Inpatient Diabetes Program Recommendations  AACE/ADA: New Consensus Statement on Inpatient Glycemic Control (2009)  Target Ranges:  Prepandial:   less than 140 mg/dL      Peak postprandial:   less than 180 mg/dL (1-2 hours)      Critically ill patients:  140 - 180 mg/dL   Reason for Visit: Consult for Hyperglycemia, elevated HgbA1C, newly-diagnosed diabetes  Inpatient Diabetes Program Recommendations Insulin - Basal: Increase Lantus to 28 units QHS Correction (SSI): Decrease Novolog correction to moderate scale tidwc Insulin - Meal Coverage: Increase Novolog to 8 units tidwc for meal coverage insulin Outpatient Referral: OP Diabetes Education consult for HgbA1C >7.    Note: Pt is watching diabetes videos on pt ed channel.  He seems very motivated to make lifestyle changes to control blood sugars at home. Will need PCP to manage diabetes after discharge.  Will also need prescription for meter and supplies.  RN to continue teaching insulin administration.  Received Living Well with Diabetes book and answered questions.  Will follow-up tomorrow morning.  Encouragement given and explained importance of glucose control to prevent complications.  Verbalized understanding.  Thank you.  Gordy Councilman, RD, LDN, CDE Inpatient Diabetes Coordinator

## 2011-07-22 NOTE — Progress Notes (Signed)
Patient ID: Robert Chaney, male   DOB: Oct 27, 1950, 61 y.o.   MRN: [id number] Patient ID: Robert Chaney, male   DOB: May 07, 1951, 61 y.o.   MRN: [id number]     SUBJECTIVE: No chest pain or dyspnea.  Creatinine improving. Rhythm stable.     . clopidogrel  75 mg Oral Q breakfast  . enoxaparin  30 mg Subcutaneous Q24H  . influenza  inactive virus vaccine  0.5 mL Intramuscular Tomorrow-1000  . insulin aspart  0-20 Units Subcutaneous TID WC  . insulin aspart  0-5 Units Subcutaneous QHS  . insulin aspart  6 Units Subcutaneous TID WC  . insulin glargine  24 Units Subcutaneous QHS  . isosorbide mononitrate  60 mg Oral Daily  . living well with diabetes book   Does not apply Once  . metoprolol  100 mg Oral BID  . pneumococcal 23 valent vaccine  0.5 mL Intramuscular Tomorrow-1000  . DISCONTD: insulin aspart  3 Units Subcutaneous TID WC  . DISCONTD: insulin glargine  20 Units Subcutaneous QHS  . DISCONTD: lidocaine   Topical Once  . DISCONTD: metoprolol  100 mg Oral BID  . DISCONTD: metoprolol  50 mg Oral BID  . DISCONTD: metoprolol  25 mg Oral BID  1/2 NS at 100 cc/hr    Filed Vitals:   07/21/11 1806 07/21/11 2135 07/22/11 0157 07/22/11 0537  BP: 107/51 143/72 133/71 148/67  Pulse: 79 71 60 61  Temp: 97.6 F (36.4 C) 97.9 F (36.6 C) 97.4 F (36.3 C) 98 F (36.7 C)  TempSrc: Oral     Resp: 18 18 18 18   Height:      Weight:      SpO2: 96% 94% 97% 95%    Intake/Output Summary (Last 24 hours) at 07/22/11 0841 Last data filed at 07/22/11 0537  Gross per 24 hour  Intake   1125 ml  Output   2100 ml  Net   -975 ml    LABS: Basic Metabolic Panel:  Basename 07/22/11 0645 07/21/11 0436  NA 146* 155*  K 4.2 3.8  CL 114* 122*  CO2 24 23  GLUCOSE 291* 155*  BUN 65* 97*  CREATININE 1.78* 2.76*  CALCIUM 9.1 8.3*  MG -- --  PHOS -- --   Liver Function Tests:  Basename 07/20/11 0600  AST 38*  ALT 24  ALKPHOS 90  BILITOT 0.6  PROT 7.5  ALBUMIN 3.1*   No results found for  this basename: LIPASE:2,AMYLASE:2 in the last 72 hours CBC:  Basename 07/22/11 0645 07/21/11 0436  WBC 7.6 10.0  NEUTROABS -- --  HGB 12.2* 12.5*  HCT 38.7* 38.7*  MCV 99.5 96.8  PLT 141* 150   Cardiac Enzymes:  Basename 07/21/11 1552 07/20/11 1245 07/20/11 1240 07/19/11 2353  CKTOTAL 1179* 3436* 3429* --  CKMB 8.9* -- 12.0* 12.3*  CKMBINDEX -- -- -- --  TROPONINI 0.74* -- 1.41* 1.47*   BNP: No components found with this basename: POCBNP:3 D-Dimer: No results found for this basename: DDIMER:2 in the last 72 hours Hemoglobin A1C:  Basename 07/19/11 1514  HGBA1C 12.0*   Fasting Lipid Panel: No results found for this basename: CHOL,HDL,LDLCALC,TRIG,CHOLHDL,LDLDIRECT in the last 72 hours Thyroid Function Tests: No results found for this basename: TSH,T4TOTAL,FREET3,T3FREE,THYROIDAB in the last 72 hours Anemia Panel:  Basename 07/20/11 1245  VITAMINB12 1589*  FOLATE 11.0  FERRITIN --  TIBC --  IRON --  RETICCTPCT --    RADIOLOGY: Dg Chest 2 View  07/19/2011  *RADIOLOGY REPORT*  Clinical Data: Shortness of breath.  High blood pressure. Diabetic.  Nonsmoker.  CHEST - 2 VIEW  Comparison: 04/17/2009.  Findings: Cardiomegaly.  Pulmonary vascular congestion.  Tortuous aorta.  Shoulder joint degenerative changes.  IMPRESSION: Cardiomegaly.  Pulmonary vascular congestion most notable centrally.  Tortuous aorta.  Original Report Authenticated By: Doug Sou, M.D.    PHYSICAL EXAM General: NAD Neck: No JVD, no thyromegaly or thyroid nodule.  Lungs: Clear to auscultation bilaterally with normal respiratory effort. CV: Nondisplaced PMI.  Heart regular S1/S2, no [id number], 1/6 systolic murmur upper sternal border.  No peripheral edema.  No carotid bruit.  Normal pedal pulses.  Abdomen: Soft, nontender, no hepatosplenomegaly, no distention.  Neurologic: Alert and oriented x 3.  Psych: Normal affect. Extremities: No clubbing or cyanosis.   TELEMETRY: Reviewed telemetry pt in  NSR:  ASSESSMENT AND PLAN: 61 yo admitted with hyperglycemic hyperosmolar state, denies prior knowledge of diabetes.  He was noted to be in ARF with creatinine > 4.  He had CK elevated to > 3000, TnI to 1.47.  No chest pain.  Echo with normal EF, severe LVH with some LVOT obstruction and mitral valve SAM.  1. Elevated troponin: In setting of ARF and hyperosmolar nonketotic state.  Has risk factors for CAD.  No chest pain.  Highest suspicion for demand ischemia.   - Continue Plavix (ASA allergy), beta blocker.  - Statin when CK comes down.  - Should eventually get functional study. 2.  Diastolic CHF: Does not appear volume overloaded.  Has long-standing HTN with severe LVH.  Given dynamic LV obstruction, will increase metoprolol to 50 mg bid.  3. ARF: Suspect primarily due to dehydration with HHS.  CPK not so high that would expect severe ARF from this elevation alone. Getting IV fluid, creatinine improving. 4. Hypernatremia: Getting 1/2 NS.  Per medicine.     Darlin Coco 07/22/2011 8:41 AM

## 2011-07-22 NOTE — Progress Notes (Signed)
Subjective: Chart reviewed. Patient denies complaints. He indicates that he did not know that he was a diabetic.  Objective: Blood pressure 114/57, pulse 64, temperature 98.2 F (36.8 C), temperature source Oral, resp. rate 18, height 5\' 8"  (1.727 m), weight 79.1 kg (174 lb 6.1 oz), SpO2 99.00%.  Intake/Output Summary (Last 24 hours) at 07/22/11 1513 Last data filed at 07/22/11 1346  Gross per 24 hour  Intake   1605 ml  Output   2100 ml  Net   -495 ml    General Exam: Comfortable. Sitting on a chair and in no obvious distress. Respiratory System: Clear. No increased work of breathing. Cardiovascular System: First and second heart sounds heard. Regular rate and rhythm. No JVD/murmurs. Telemetry shows sinus rhythm in the 60s to 70s. Gastrointestinal System: Abdomen is non distended, soft and normal bowel sounds heard. Central Nervous System: Alert and oriented. No focal neurological deficits.  Lab Results: Basic Metabolic Panel:  Basename 07/22/11 0645 07/21/11 0436  NA 146* 155*  K 4.2 3.8  CL 114* 122*  CO2 24 23  GLUCOSE 291* 155*  BUN 65* 97*  CREATININE 1.78* 2.76*  CALCIUM 9.1 8.3*  MG -- --  PHOS -- --   Liver Function Tests:  Basename 07/20/11 0600  AST 38*  ALT 24  ALKPHOS 90  BILITOT 0.6  PROT 7.5  ALBUMIN 3.1*   No results found for this basename: LIPASE:2,AMYLASE:2 in the last 72 hours No results found for this basename: AMMONIA:2 in the last 72 hours CBC:  Basename 07/22/11 0645 07/21/11 0436  WBC 7.6 10.0  NEUTROABS -- --  HGB 12.2* 12.5*  HCT 38.7* 38.7*  MCV 99.5 96.8  PLT 141* 150   Cardiac Enzymes:  Basename 07/22/11 1200 07/21/11 1552 07/20/11 1245 07/20/11 1240  CKTOTAL 416* 1179* 3436* --  CKMB 7.2* 8.9* -- 12.0*  CKMBINDEX -- -- -- --  TROPONINI 0.45* 0.74* -- 1.41*   BNP:  Basename 07/20/11 0600  PROBNP 1493.0*   D-Dimer: No results found for this basename: DDIMER:2 in the last 72 hours CBG:  Basename 07/22/11 1211  07/22/11 0814 07/21/11 2131 07/21/11 1608 07/21/11 1108 07/21/11 0756  GLUCAP 279* 237* 166* 309* 403* 167*   Hemoglobin A1C:  Basename 07/19/11 1514  HGBA1C 12.0*   Fasting Lipid Panel: No results found for this basename: CHOL,HDL,LDLCALC,TRIG,CHOLHDL,LDLDIRECT in the last 72 hours Thyroid Function Tests: No results found for this basename: TSH,T4TOTAL,FREET4,T3FREE,THYROIDAB in the last 72 hours Anemia Panel:  Basename 07/20/11 1245  VITAMINB12 1589*  FOLATE 11.0  FERRITIN --  TIBC --  IRON --  RETICCTPCT --   Coagulation: No results found for this basename: LABPROT:2,INR:2 in the last 72 hours Urine Drug Screen: Drugs of Abuse     Component Value Date/Time   LABOPIA NONE DETECTED 04/16/2009 2132   [place] DETECTED 04/16/2009 2132   LABBENZ NONE DETECTED 04/16/2009 2132   AMPHETMU NONE DETECTED 04/16/2009 2132   THCU NONE DETECTED 04/16/2009 2132   LABBARB  Value: NONE DETECTED        DRUG SCREEN FOR MEDICAL PURPOSES ONLY.  IF CONFIRMATION IS NEEDED FOR ANY PURPOSE, NOTIFY LAB WITHIN 5 DAYS.        LOWEST DETECTABLE LIMITS FOR URINE DRUG SCREEN Drug Class       Cutoff (ng/mL) Amphetamine      1000 Barbiturate      200 Benzodiazepine   [id number] Tricyclics       [id number] Opiates  300 Cocaine          300 THC              50 04/16/2009 2132    Alcohol Level: No results found for this basename: ETH:2 in the last 72 hours  Micro Results: Recent Results (from the past 240 hour(s))  MRSA PCR SCREENING     Status: Normal   Collection Time   07/19/11  1:30 PM      Component Value Range Status Comment   MRSA by PCR NEGATIVE  NEGATIVE  Final     Studies/Results: Dg Chest 2 View  07/19/2011  *RADIOLOGY REPORT*  Clinical Data: Shortness of breath.  High blood pressure. Diabetic.  Nonsmoker.  CHEST - 2 VIEW  Comparison: 04/17/2009.  Findings: Cardiomegaly.  Pulmonary vascular congestion.  Tortuous aorta.  Shoulder joint degenerative changes.  IMPRESSION: Cardiomegaly.   Pulmonary vascular congestion most notable centrally.  Tortuous aorta.  Original Report Authenticated By: Doug Sou, M.D.   US Renal Port  07/20/2011  *RADIOLOGY REPORT*  Clinical Data: Acute renal failure  ULTRASOUND PORTABLE RENAL  Technique:  Complete ultrasound examination of the urinary tract was performed including evaluation of the kidneys, renal collecting systems, and urinary bladder.  Comparison: None.  Findings: The right kidney measures 10.4 cm and has a 1 cm simple appearing cyst in the upper pole. Right renal echogenicity is similar to the adjacent liver, without findings of hydronephrosis, renal mass, or right renal calculus.  The left kidney measures 10.4 cm in length and demonstrates echogenicity equivocally higher than the spleen. No renal mass or hydronephrosis noted.  On image 30 there is a small mid kidney hyperechogenicity which may have slight associated shadowing, potentially representing a 2 mm nonobstructive calculus.  The urinary bladder appears unremarkable.  IMPRESSION:  1.  Borderline hyperechoic kidneys, better seen on the left - query chronic medical renal disease. 2.  Suspected nonobstructive small calculus in the left mid kidney. 3.  Small right kidney upper pole cyst.  Original Report Authenticated By: Carron Curie, M.D.   2-D echocardiogram:  Study Conclusions  - Left ventricle: The cavity size was normal. There was severe concentric hypertrophy. Systolic function was normal. The estimated ejection fraction was in the range of 60% to 65%. There was dynamic obstruction. Wall motion was normal; there were no regional wall motion abnormalities. Doppler parameters are consistent with abnormal left ventricular relaxation (grade 1 diastolic dysfunction). - Mitral valve: There was systolic anterior motion.    Medications: Scheduled Meds:    . clopidogrel  75 mg Oral Q breakfast  . enoxaparin  30 mg Subcutaneous Q24H  . influenza  inactive virus  vaccine  0.5 mL Intramuscular Tomorrow-1000  . insulin aspart  0-20 Units Subcutaneous TID WC  . insulin aspart  0-5 Units Subcutaneous QHS  . insulin aspart  6 Units Subcutaneous TID WC  . insulin glargine  24 Units Subcutaneous QHS  . isosorbide mononitrate  60 mg Oral Daily  . living well with diabetes book   Does not apply Once  . metoprolol  100 mg Oral BID  . pneumococcal 23 valent vaccine  0.5 mL Intramuscular Tomorrow-1000  . DISCONTD: metoprolol  100 mg Oral BID   Continuous Infusions:    . sodium chloride 75 mL/hr at 07/21/11 2141   PRN Meds:.acetaminophen, acetaminophen, dextrose, morphine injection, nitroGLYCERIN, ondansetron (ZOFRAN) IV, ondansetron, oxyCODONE  Assessment/Plan: 1. Acute on chronic renal failure: Secondary to dehydration. Improving. Baseline creatinine 1.5. Renal ultrasound does  not show any obstruction. 2. Hyper natremia dehydration: Improving. Continue half normal saline. 3. Uncontrolled type 2 diabetes mellitus: Improved hyperosmolar nonketotic state. Adjust insulins and monitor. Educated patient regarding self administration of insulin. 4. Hypertension: Controlled he did continue metoprolol. 5. Elevated troponin, possibly secondary to demand ischemia from rhabdomyolysis in the setting of acute renal failure and hyperosmolar nonketotic state. Cardiology followup appreciated. Eventually for stress test? Outpatient. Continue Plavix andn Imdur. 6. Chronic diastolic congestive heart failure: Compensated 7. Urethral stricture, with Foley catheter: Urology consultation report appreciated. We'll discontinue Foley catheter tomorrow and monitor.  Disposition: Possible discharge in 2-3 days.  Lasonya Hubner 07/22/2011, 3:13 PM

## 2011-07-22 NOTE — Progress Notes (Signed)
Utilization review completed. Robert Chaney 07/22/2011

## 2011-07-23 ENCOUNTER — Other Ambulatory Visit: Payer: Self-pay

## 2011-07-23 DIAGNOSIS — I10 Essential (primary) hypertension: Secondary | ICD-10-CM

## 2011-07-23 LAB — PROTEIN ELECTROPHORESIS, SERUM
Albumin ELP: 53 % — ABNORMAL LOW (ref 55.8–66.1)
Alpha-1-Globulin: 4 % (ref 2.9–4.9)
Alpha-2-Globulin: 8.5 % (ref 7.1–11.8)
Beta 2: 4.3 % (ref 3.2–6.5)
Beta Globulin: 4.9 % (ref 4.7–7.2)
Gamma Globulin: 25.3 % — ABNORMAL HIGH (ref 11.1–18.8)
M-Spike, %: 1.41 g/dL
Total Protein ELP: 6.7 g/dL (ref 6.0–8.3)

## 2011-07-23 LAB — UIFE/LIGHT CHAINS/TP QN, 24-HR UR
Free Kappa Lt Chains,Ur: 83.8 mg/dL — ABNORMAL HIGH (ref 0.14–2.42)
Free Kappa/Lambda Ratio: 33.65 ratio — ABNORMAL HIGH (ref 2.04–10.37)
Gamma Globulin, Urine: DETECTED — AB
Total Protein, Urine: 89.9 mg/dL

## 2011-07-23 LAB — CBC
HCT: 34.6 % — ABNORMAL LOW (ref 39.0–52.0)
MCHC: 32.1 g/dL (ref 30.0–36.0)
Platelets: 121 10*3/uL — ABNORMAL LOW (ref 150–400)
RDW: 12.4 % (ref 11.5–15.5)
WBC: 5.9 10*3/uL (ref 4.0–10.5)

## 2011-07-23 LAB — GLUCOSE, CAPILLARY
Glucose-Capillary: 247 mg/dL — ABNORMAL HIGH (ref 70–99)
Glucose-Capillary: 280 mg/dL — ABNORMAL HIGH (ref 70–99)
Glucose-Capillary: 222 mg/dL — ABNORMAL HIGH (ref 70–99)

## 2011-07-23 LAB — BASIC METABOLIC PANEL
BUN: 50 mg/dL — ABNORMAL HIGH (ref 6–23)
GFR calc Af Amer: 52 mL/min — ABNORMAL LOW (ref >90)
GFR calc non Af Amer: 45 mL/min — ABNORMAL LOW (ref >90)
Potassium: 4.3 mEq/L (ref 3.5–5.1)

## 2011-07-23 MED ORDER — INSULIN ASPART 100 UNIT/ML ~~LOC~~ SOLN
0.0000 [IU] | Freq: Every day | SUBCUTANEOUS | Status: DC
  Administered 2011-07-23: 2 [IU] via SUBCUTANEOUS

## 2011-07-23 MED ORDER — INSULIN ASPART 100 UNIT/ML ~~LOC~~ SOLN
0.0000 [IU] | Freq: Three times a day (TID) | SUBCUTANEOUS | Status: DC
  Administered 2011-07-23: 8 [IU] via SUBCUTANEOUS
  Administered 2011-07-24: 5 [IU] via SUBCUTANEOUS
  Administered 2011-07-24: 8 [IU] via SUBCUTANEOUS
  Administered 2011-07-24: 3 [IU] via SUBCUTANEOUS

## 2011-07-23 MED ORDER — INSULIN ASPART 100 UNIT/ML ~~LOC~~ SOLN
8.0000 [IU] | Freq: Three times a day (TID) | SUBCUTANEOUS | Status: DC
  Administered 2011-07-23 – 2011-07-24 (×4): 8 [IU] via SUBCUTANEOUS

## 2011-07-23 MED ORDER — ENOXAPARIN SODIUM 40 MG/0.4ML ~~LOC~~ SOLN
40.0000 mg | SUBCUTANEOUS | Status: DC
  Administered 2011-07-23 – 2011-07-24 (×2): 40 mg via SUBCUTANEOUS
  Filled 2011-07-23 (×2): qty 0.4

## 2011-07-23 NOTE — Progress Notes (Signed)
Patient ID: Robert Chaney, male   DOB: 04-04-1951, 61 y.o.   MRN: [id number] Patient ID: Robert Chaney, male   DOB: 1950-07-27, 61 y.o.   MRN: [id number] Patient ID: Robert Chaney, male   DOB: 02-Oct-1950, 61 y.o.   MRN: [id number]     SUBJECTIVE: No chest pain or dyspnea.  Creatinine improving. Rhythm stable.      . clopidogrel  75 mg Oral Q breakfast  . enoxaparin  30 mg Subcutaneous Q24H  . influenza  inactive virus vaccine  0.5 mL Intramuscular Tomorrow-1000  . insulin aspart  0-20 Units Subcutaneous TID WC  . insulin aspart  0-5 Units Subcutaneous QHS  . insulin aspart  6 Units Subcutaneous TID WC  . insulin glargine  30 Units Subcutaneous QHS  . isosorbide mononitrate  60 mg Oral Daily  . metoprolol  100 mg Oral BID  . pneumococcal 23 valent vaccine  0.5 mL Intramuscular Tomorrow-1000  . DISCONTD: insulin glargine  24 Units Subcutaneous QHS  1/2 NS at 100 cc/hr    Filed Vitals:   07/22/11 0537 07/22/11 1345 07/22/11 2226 07/23/11 0658  BP: 148/67 114/57 157/77 166/77  Pulse: 61 64 74 64  Temp: 98 F (36.7 C) 98.2 F (36.8 C) 98.3 F (36.8 C) 97.9 F (36.6 C)  TempSrc:  Oral Oral Oral  Resp: 18 18 20 20   Height:      Weight:      SpO2: 95% 99% 96% 95%    Intake/Output Summary (Last 24 hours) at 07/23/11 0737 Last data filed at 07/23/11 0700  Gross per 24 hour  Intake   1860 ml  Output   2700 ml  Net   -840 ml    LABS: Basic Metabolic Panel:  Basename 07/23/11 0531 07/22/11 0645  NA 144 146*  K 4.3 4.2  CL 115* 114*  CO2 25 24  GLUCOSE 263* 291*  BUN 50* 65*  CREATININE 1.60* 1.78*  CALCIUM 9.1 9.1  MG -- --  PHOS -- --   Liver Function Tests: No results found for this basename: AST:2,ALT:2,ALKPHOS:2,BILITOT:2,PROT:2,ALBUMIN:2 in the last 72 hours No results found for this basename: LIPASE:2,AMYLASE:2 in the last 72 hours CBC:  Basename 07/23/11 0531 07/22/11 0645  WBC 5.9 7.6  NEUTROABS -- --  HGB 11.1* 12.2*  HCT 34.6* 38.7*  MCV 97.5 99.5  PLT  121* 141*   Cardiac Enzymes:  Basename 07/22/11 1200 07/21/11 1552 07/20/11 1245 07/20/11 1240  CKTOTAL 416* 1179* 3436* --  CKMB 7.2* 8.9* -- 12.0*  CKMBINDEX -- -- -- --  TROPONINI 0.45* 0.74* -- 1.41*   BNP: No components found with this basename: POCBNP:3 D-Dimer: No results found for this basename: DDIMER:2 in the last 72 hours Hemoglobin A1C: No results found for this basename: HGBA1C in the last 72 hours Fasting Lipid Panel: No results found for this basename: CHOL,HDL,LDLCALC,TRIG,CHOLHDL,LDLDIRECT in the last 72 hours Thyroid Function Tests: No results found for this basename: TSH,T4TOTAL,FREET3,T3FREE,THYROIDAB in the last 72 hours Anemia Panel:  Basename 07/20/11 1245  VITAMINB12 1589*  FOLATE 11.0  FERRITIN --  TIBC --  IRON --  RETICCTPCT --    RADIOLOGY: Dg Chest 2 View  07/19/2011  *RADIOLOGY REPORT*  Clinical Data: Shortness of breath.  High blood pressure. Diabetic.  Nonsmoker.  CHEST - 2 VIEW  Comparison: 04/17/2009.  Findings: Cardiomegaly.  Pulmonary vascular congestion.  Tortuous aorta.  Shoulder joint degenerative changes.  IMPRESSION: Cardiomegaly.  Pulmonary vascular congestion most notable centrally.  Tortuous aorta.  Original  Report Authenticated By: Doug Sou, M.D.    PHYSICAL EXAM General: NAD Neck: No JVD, no thyromegaly or thyroid nodule.  Lungs: Clear to auscultation bilaterally with normal respiratory effort. CV: Nondisplaced PMI.  Heart regular S1/S2, no [id number], 1/6 systolic murmur upper sternal border.  No peripheral edema.  No carotid bruit.  Normal pedal pulses.  Abdomen: Soft, nontender, no hepatosplenomegaly, no distention.  Neurologic: Alert and oriented x 3.  Psych: Normal affect. Extremities: No clubbing or cyanosis.   TELEMETRY: Reviewed telemetry pt in NSR:  ASSESSMENT AND PLAN: 61 yo admitted with hyperglycemic hyperosmolar state, denies prior knowledge of diabetes.  He was noted to be in ARF with creatinine > 4.  He  had CK elevated to > 3000, TnI to 1.47.  No chest pain.  Echo with normal EF, severe LVH with some LVOT obstruction and mitral valve SAM.  1. Elevated troponin: In setting of ARF and hyperosmolar nonketotic state.  Has risk factors for CAD.  No chest pain.  Highest suspicion for demand ischemia.  Will plan outpatient myoview after discharge. - Continue Plavix (ASA allergy), beta blocker.  - Statin when CK comes down.  2.  Diastolic CHF: Does not appear volume overloaded.  Has long-standing HTN with severe LVH.  Has dynamic LVOT obstruction.  Tolerating Lopressor 100 mg BID 3. ARF: Suspect primarily due to dehydration with HHS.  CPK not so high that would expect severe ARF from this elevation alone. Getting IV fluid, creatinine improving. 4. Hypernatremia: Getting 1/2 NS.  Per medicine.     Darlin Coco 07/23/2011 7:37 AM

## 2011-07-23 NOTE — Progress Notes (Signed)
Subjective: Denies complaints.  Objective: Blood pressure 130/60, pulse 70, temperature 98 F (36.7 C), temperature source Oral, resp. rate 20, height 5\' 8"  (1.727 m), weight 79.1 kg (174 lb 6.1 oz), SpO2 95.00%.  Intake/Output Summary (Last 24 hours) at 07/23/11 1350 Last data filed at 07/23/11 1325  Gross per 24 hour  Intake   1740 ml  Output   1950 ml  Net   -210 ml    General Exam: Comfortable. Sitting on a chair and in no obvious distress. Respiratory System: Clear. No increased work of breathing. Cardiovascular System: First and second heart sounds heard. Regular rate and rhythm. No JVD/murmurs.  Gastrointestinal System: Abdomen is non distended, soft and normal bowel sounds heard. Central Nervous System: Alert and oriented. No focal neurological deficits.  Lab Results: Basic Metabolic Panel:  Basename 07/23/11 0531 07/22/11 0645  NA 144 146*  K 4.3 4.2  CL 115* 114*  CO2 25 24  GLUCOSE 263* 291*  BUN 50* 65*  CREATININE 1.60* 1.78*  CALCIUM 9.1 9.1  MG -- --  PHOS -- --   Liver Function Tests: No results found for this basename: AST:2,ALT:2,ALKPHOS:2,BILITOT:2,PROT:2,ALBUMIN:2 in the last 72 hours No results found for this basename: LIPASE:2,AMYLASE:2 in the last 72 hours No results found for this basename: AMMONIA:2 in the last 72 hours CBC:  Basename 07/23/11 0531 07/22/11 0645  WBC 5.9 7.6  NEUTROABS -- --  HGB 11.1* 12.2*  HCT 34.6* 38.7*  MCV 97.5 99.5  PLT 121* 141*   Cardiac Enzymes:  Basename 07/22/11 1200 07/21/11 1552  CKTOTAL 416* 1179*  CKMB 7.2* 8.9*  CKMBINDEX -- --  TROPONINI 0.45* 0.74*   BNP: No results found for this basename: PROBNP:3 in the last 72 hours D-Dimer: No results found for this basename: DDIMER:2 in the last 72 hours CBG:  Basename 07/23/11 1201 07/23/11 0747 07/22/11 2220 07/22/11 1700 07/22/11 1211 07/22/11 0814  GLUCAP 247* 237* 201* 204* 279* 237*   Hemoglobin A1C: No results found for this basename: HGBA1C  in the last 72 hours Fasting Lipid Panel: No results found for this basename: CHOL,HDL,LDLCALC,TRIG,CHOLHDL,LDLDIRECT in the last 72 hours Thyroid Function Tests: No results found for this basename: TSH,T4TOTAL,FREET4,T3FREE,THYROIDAB in the last 72 hours Anemia Panel: No results found for this basename: VITAMINB12,FOLATE,FERRITIN,TIBC,IRON,RETICCTPCT in the last 72 hours Coagulation: No results found for this basename: LABPROT:2,INR:2 in the last 72 hours Urine Drug Screen: Drugs of Abuse     Component Value Date/Time   LABOPIA NONE DETECTED 04/16/2009 2132   [place] 04/16/2009 2132   LABBENZ NONE DETECTED 04/16/2009 2132   AMPHETMU NONE DETECTED 04/16/2009 2132   THCU NONE DETECTED 04/16/2009 2132   LABBARB  Value: NONE DETECTED        DRUG SCREEN FOR MEDICAL PURPOSES ONLY.  IF CONFIRMATION IS NEEDED FOR ANY PURPOSE, NOTIFY LAB WITHIN 5 DAYS.        LOWEST DETECTABLE LIMITS FOR URINE DRUG SCREEN Drug Class       Cutoff (ng/mL) Amphetamine      1000 Barbiturate      200 Benzodiazepine   [id number] Tricyclics       [id number] Opiates          300 Cocaine          300 THC              50 04/16/2009 2132    Alcohol Level: No results found for this basename: ETH:2 in the last 72 hours  Micro Results: Recent Results (  from the past 240 hour(s))  MRSA PCR SCREENING     Status: Normal   Collection Time   07/19/11  1:30 PM      Component Value Range Status Comment   MRSA by PCR NEGATIVE  NEGATIVE  Final     Studies/Results: Dg Chest 2 View  07/19/2011  *RADIOLOGY REPORT*  Clinical Data: Shortness of breath.  High blood pressure. Diabetic.  Nonsmoker.  CHEST - 2 VIEW  Comparison: 04/17/2009.  Findings: Cardiomegaly.  Pulmonary vascular congestion.  Tortuous aorta.  Shoulder joint degenerative changes.  IMPRESSION: Cardiomegaly.  Pulmonary vascular congestion most notable centrally.  Tortuous aorta.  Original Report Authenticated By: Doug Sou, M.D.   US Renal Port  07/20/2011   *RADIOLOGY REPORT*  Clinical Data: Acute renal failure  ULTRASOUND PORTABLE RENAL  Technique:  Complete ultrasound examination of the urinary tract was performed including evaluation of the kidneys, renal collecting systems, and urinary bladder.  Comparison: None.  Findings: The right kidney measures 10.4 cm and has a 1 cm simple appearing cyst in the upper pole. Right renal echogenicity is similar to the adjacent liver, without findings of hydronephrosis, renal mass, or right renal calculus.  The left kidney measures 10.4 cm in length and demonstrates echogenicity equivocally higher than the spleen. No renal mass or hydronephrosis noted.  On image 30 there is a small mid kidney hyperechogenicity which may have slight associated shadowing, potentially representing a 2 mm nonobstructive calculus.  The urinary bladder appears unremarkable.  IMPRESSION:  1.  Borderline hyperechoic kidneys, better seen on the left - query chronic medical renal disease. 2.  Suspected nonobstructive small calculus in the left mid kidney. 3.  Small right kidney upper pole cyst.  Original Report Authenticated By: Carron Curie, M.D.   2-D echocardiogram:  Study Conclusions  - Left ventricle: The cavity size was normal. There was severe concentric hypertrophy. Systolic function was normal. The estimated ejection fraction was in the range of 60% to 65%. There was dynamic obstruction. Wall motion was normal; there were no regional wall motion abnormalities. Doppler parameters are consistent with abnormal left ventricular relaxation (grade 1 diastolic dysfunction). - Mitral valve: There was systolic anterior motion.    Medications: Scheduled Meds:    . clopidogrel  75 mg Oral Q breakfast  . enoxaparin  30 mg Subcutaneous Q24H  . insulin aspart  0-20 Units Subcutaneous TID WC  . insulin aspart  0-5 Units Subcutaneous QHS  . insulin aspart  6 Units Subcutaneous TID WC  . insulin glargine  30 Units Subcutaneous  QHS  . isosorbide mononitrate  60 mg Oral Daily  . metoprolol  100 mg Oral BID  . DISCONTD: insulin glargine  24 Units Subcutaneous QHS   Continuous Infusions:    . sodium chloride 75 mL/hr at 07/23/11 0234   PRN Meds:.acetaminophen, acetaminophen, dextrose, morphine injection, nitroGLYCERIN, ondansetron (ZOFRAN) IV, ondansetron, oxyCODONE  Assessment/Plan: 1. Acute on chronic renal failure: Secondary to dehydration. Improving. Baseline creatinine 1.5. Renal ultrasound does not show any obstruction. Discontinue IV fluids. Encouraged oral fluid intake. 2. Hyper natremia dehydration: Improving. Discontinue IV fluids and monitor. 3. Uncontrolled type 2 diabetes mellitus: Improved hyperosmolar nonketotic state. Continue Lantus 30 units subcutaneously at bedtime, increase NovoLog to 8 units 3 times a day with meals and change sliding scale to moderate sensitivity. 4. Hypertension: Controlled and continue metoprolol. 5. Elevated troponin, possibly secondary to demand ischemia from rhabdomyolysis in the setting of acute renal failure and hyperosmolar nonketotic state. Cardiology followup appreciated.  Outpatient Myoview. Continue Plavix and Imdur. 6. Chronic diastolic congestive heart failure: Compensated. Discontinue IV fluids 7. Urethral stricture, with Foley catheter: Urology consultation report appreciated. Discontinue Foley catheter and monitor. 8. Anemia and thrombocytopenia: Follow CBC tomorrow. No overt bleeding.  Disposition: Possible discharge in 24 hours.  Jayen Bromwell 07/23/2011, 1:50 PM

## 2011-07-23 NOTE — Progress Notes (Signed)
Pharmacy Lovenox Dose Adjustment  61 yo M started on Lovenox for VTE prophylaxis.  Pt presented with acute on chronic renal failure and SCr = 4.6. Renal function continues to improve with hydration and today's SCr near baseline at 1.6.  Estimated CrCl~ 46 ml/min.  Will change Lovenox dosing to 40mg  SQ daily for CrCl >30.  [employer], Pharm.D., BCPS Clinical Pharmacist Pager [phone]

## 2011-07-24 LAB — CBC
HCT: 34.6 % — ABNORMAL LOW (ref 39.0–52.0)
MCHC: 32.1 g/dL (ref 30.0–36.0)
Platelets: 139 10*3/uL — ABNORMAL LOW (ref 150–400)
RDW: 12.4 % (ref 11.5–15.5)

## 2011-07-24 LAB — GLUCOSE, CAPILLARY: Glucose-Capillary: 193 mg/dL — ABNORMAL HIGH (ref 70–99)

## 2011-07-24 LAB — BASIC METABOLIC PANEL
BUN: 43 mg/dL — ABNORMAL HIGH (ref 6–23)
GFR calc Af Amer: 52 mL/min — ABNORMAL LOW (ref >90)
GFR calc non Af Amer: 45 mL/min — ABNORMAL LOW (ref >90)
Potassium: 4.3 mEq/L (ref 3.5–5.1)

## 2011-07-24 MED ORDER — INSULIN ASPART 100 UNIT/ML ~~LOC~~ SOLN: 8.0000 [IU] | Freq: Three times a day (TID) | SUBCUTANEOUS | Status: DC

## 2011-07-24 MED ORDER — GLUCOSE BLOOD VI STRP: ORAL_STRIP | Status: AC

## 2011-07-24 MED ORDER — INSULIN GLARGINE 100 UNIT/ML ~~LOC~~ SOLN: 30.0000 [IU] | Freq: Every day | SUBCUTANEOUS | Status: DC

## 2011-07-24 MED ORDER — AMLODIPINE BESYLATE 5 MG PO TABS
5.0000 mg | ORAL_TABLET | Freq: Every day | ORAL | Status: DC
  Administered 2011-07-24: 5 mg via ORAL
  Filled 2011-07-24 (×2): qty 1

## 2011-07-24 MED ORDER — CLOPIDOGREL BISULFATE 75 MG PO TABS: 75.0000 mg | ORAL_TABLET | Freq: Every day | ORAL | Status: AC

## 2011-07-24 MED ORDER — ALCOHOL SWABS PADS: 1.0000 | MEDICATED_PAD | Freq: Once | Status: DC

## 2011-07-24 MED ORDER — "INSULIN SYRINGE 28G X 1/2"" 1 ML MISC": 1.0000 | Freq: Once | Status: DC

## 2011-07-24 MED ORDER — AMLODIPINE BESYLATE 5 MG PO TABS: 10.0000 mg | ORAL_TABLET | Freq: Every day | ORAL | Status: DC

## 2011-07-24 MED ORDER — ACCU-CHEK SOFT TOUCH LANCETS MISC: Status: AC

## 2011-07-24 MED ORDER — FREESTYLE SYSTEM KIT: 1.0000 | PACK | Status: AC | PRN

## 2011-07-24 NOTE — Discharge Summary (Signed)
Patient ID: KEI SARWAR MRN: [id number] DOB/AGE: 63-16-1952 63 y.o.  Admit date: 07/19/2011 Discharge date: 07/24/2011  Primary Care Physician:  Darlin Coco, MD, MD  Discharge Diagnoses:    Present on Admission:  .Diabetic hyperosmolar non-ketotic state .Acute on chronic kidney failure .Troponin level elevated .Abnormal EKG .Hyperkalemia .Volume depletion .Diastolic CHF, chronic .HTN (hypertension), uncontrolled .Elevated CPK .urethral stricture    Medication List  As of 07/24/2011  1:09 PM   STOP taking these medications         hydrochlorothiazide 25 MG tablet      lisinopril 20 MG tablet      simvastatin 40 MG tablet         TAKE these medications         accu-chek soft touch lancets   Use as instructed      Alcohol Swabs Pads   1 each by Does not apply route once.      amLODipine 5 MG tablet   Commonly known as: NORVASC   Take 2 tablets (10 mg total) by mouth daily.      clopidogrel 75 MG tablet   Commonly known as: PLAVIX   Take 1 tablet (75 mg total) by mouth daily with breakfast.      glucose blood test strip   Use as instructed      glucose monitoring kit monitoring kit   1 each by Does not apply route as needed for other.      insulin aspart 100 UNIT/ML injection   Commonly known as: novoLOG   Inject 8 Units into the skin 3 (three) times daily with meals.      insulin glargine 100 UNIT/ML injection   Commonly known as: LANTUS   Inject 30 Units into the skin at bedtime.      INSULIN SYRINGE 1CC/28G 28G X 1/2" 1 ML Misc   1 each by Does not apply route once.      isosorbide mononitrate 60 MG 24 hr tablet   Commonly known as: IMDUR   Take 1 tablet (60 mg total) by mouth daily.      metoprolol 100 MG tablet   Commonly known as: LOPRESSOR   TAKE ONE TABLET BY MOUTH TWICE DAILY      nitroGLYCERIN 0.4 MG SL tablet   Commonly known as: NITROSTAT   Place 0.4 mg under the tongue as needed.            Disposition and Follow-up:    Discharge home with outpatient follow up with Dr Mare Ferrari in 1-2  Weeks with follow up renal function, and stress myoview as outpatient.    Consults:  [place] (cardiology)  Significant Diagnostic Studies:  Dg Chest 2 View  07/19/2011  *RADIOLOGY REPORT*  Clinical Data: Shortness of breath.  High blood pressure. Diabetic.  Nonsmoker.  CHEST - 2 VIEW  Comparison: 04/17/2009.  Findings: Cardiomegaly.  Pulmonary vascular congestion.  Tortuous aorta.  Shoulder joint degenerative changes.  IMPRESSION: Cardiomegaly.  Pulmonary vascular congestion most notable centrally.  Tortuous aorta.  Original Report Authenticated By: Doug Sou, M.D.    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 Mare Ferrari who presents with complaints of generalized weakness. She denies chest pain, shortness of breath, nausea vomiting, leg swelling, abdominal  pain, focal weakness, diarrhea, no hematochezia nor melena. It is noted of from ED records and that patient initially complained to EMS of headache, shortness of breath and chest pain; but at the time time of my interview he denies having had any chest pain or shortness of breath. He states that he's not had anything to eat for the past 2 days -since Tuesday, and that is because he was too weak to get anything and tried to call [employer] 'but all they said was that they could not understand him'. He states he is very hungry, and also complained of polyuria and polydipsia. He is somnolent but easily aroused and oriented x3, but has intermittent confusion. He denies any prior history of diabetes. He was seen in the ED and labwork revealed a potassium of 6.0 with a blood glucose of greater than 700, BUN of 121 and creatinine of 4.6, his last  creatinine are in 2010 was 1.5. Cardiac enzymes were done and that his troponin and came back elevated at 0.26, an EKG was done and showed normal sinus rhythm with diffuse T-wave inversions, LVH with lateral STdepression/ repolarization changes -lumbar cardiology was consulted per ED and Dr. Ron Parker and indicated that they will see the patient in consultation and requested admission to the hospitalist service and transferto [facility].      Physical Exam on Discharge:  Filed Vitals:   07/23/11 1325 07/23/11 2210 07/24/11 0513 07/24/11 0650  BP: 130/60 177/73 167/79 160/100  Pulse: 70 65 66   Temp: 98 F (36.7 C) 98.7 F (37.1 C) 97.7 F (36.5 C)   TempSrc: Oral Oral    Resp: 20 20 20    Height:      Weight:      SpO2: 95% 98% 94%      Intake/Output Summary (Last 24 hours) at 07/24/11 1309 Last data filed at 07/24/11 0912  Gross per 24 hour  Intake   1680 ml  Output    300 ml  Net   1380 ml    General: elderly male  in no acute distress. HEENT: no pallor, moist oral mucosa Heart: Regular rate and rhythm, without murmurs, rubs, gallops. Lungs: Clear to auscultation bilaterally. Abdomen: Soft, nontender, nondistended, positive bowel sounds. Extremities: No clubbing cyanosis or edema with positive pedal pulses. Neuro: Alert, awake, oriented x3,Grossly intact, nonfocal.  CBC:    Component Value Date/Time   WBC 6.5 07/24/2011 0620   HGB 11.1* 07/24/2011 0620   HCT 34.6* 07/24/2011 0620   PLT 139* 07/24/2011 0620   MCV 96.9 07/24/2011 0620   NEUTROABS 9.5* 04/16/2009 1659   LYMPHSABS 0.9 04/16/2009 1659   MONOABS 0.4 04/16/2009 1659   EOSABS 0.0 04/16/2009 1659   BASOSABS 0.0 04/16/2009 123456    Basic Metabolic Panel:    Component Value Date/Time   NA 143 07/24/2011 0620   K 4.3 07/24/2011 0620   CL 111 07/24/2011 0620   CO2 22 07/24/2011 0620   BUN 43* 07/24/2011 0620   CREATININE 1.58* 07/24/2011 0620   GLUCOSE 256* 07/24/2011 0620   CALCIUM 9.7 07/24/2011 0620     Hospital Course:   Acute on chronic renal failure:  creatinine on presentation up to 4.7. Secondary to dehydration. Now improving to 1.58 today. Baseline creatinine of 1.5. Renal ultrasound does not show any obstruction. Discontinued IV fluids. Encouraged oral fluid intake.  New onset type 2 DM with hyperosmolar nonketotic state with hypernatremia.  Patient placed on insulin drip and IV fluids on admission. Now  Off insulin drip and on  Lantus 30 units subcutaneously at bedtime, increased NovoLog to 8 units 3 times a day with meals.  hemoglobin A1C of 12  Patient given scripts for glucometer kit, lantus and aspart insulin. Seen by diabetic educator while in hospital and counseled strongly on lifestyle modification including dietary modifications, exercise and mediation adherence and regular outpatient follow up.  Hypertension:  Uncontrolled, HCTZ and ACEi held on admission for AKI, cont BB, will increase dose of amlodipine to 10 mg daily. Cont imdur.. Can resume HCTZ and lisinopril oncerenal fn improved as outpatient.  Elevated troponin,  Possibly secondary to demand ischemia from rhabdomyolysis in the setting of acute renal failure and hyperosmolar nonketotic state. Patient denies any chest pain symptoms. Cardiology followup appreciated. Outpatient Myoview planned. Continue Plavix and Imdur.  Chronic diastolic congestive heart failure:  Compensated.   Urethral stricture with Foley catheter:  Urology consulted and appreciate recommendation. Symptoms can recur and should follow up with them as needed. Foley removed an able to urinate without difficulty.  Rhabdomyolysis elevated CPK to 4000s. now improved to normal Will hold off on statin for now and can be resumed as outpatient  Anemia and thrombocytopenia: currently stable.  Patient clinically stable and can be discharged home with outpatient follow up.   Time spent on Discharge: 45 minutes  Signed: Louellen Molder 07/24/2011, 1:09 PM

## 2011-07-24 NOTE — Progress Notes (Signed)
1830 Discharge instructions reviewed with patient and prescriptions given to patient verbalize and understand.Patient waiting on  His ride

## 2011-07-24 NOTE — Progress Notes (Signed)
07/24/2011 [facility] [place] Case Management Note [id number]    CARE MANAGEMENT NOTE 07/24/2011  Patient:  Robert Chaney, Robert Chaney   Account Number:  [id number]  Date Initiated:  07/24/2011  Documentation initiated by:  Kellie Moor  Subjective/Objective Assessment:   admitted on 07/19/11 with generalized weakness and acute kidney injury     Action/Plan:   prior to admission, patient lived at home with support from family. Independent with ADLs.   Anticipated DC Date:  07/24/2011   Anticipated DC Plan:  [place]  CM consult      Choice offered to / List presented to:             Status of service:  Completed, signed off Medicare Important Message given?   (If response is "NO", the following Medicare IM given date fields will be blank) Date Medicare IM given:   Date Additional Medicare IM given:    Discharge Disposition:  HOME/SELF CARE  Per UR Regulation:  Reviewed for med. necessity/level of care/duration of stay  Comments:  07/24/11- 1017-J.Bertram Savin  [id number]      61yo male patient admitted on 07/19/11 with c/o generalized weakness and acute kidney injury. Prior to admission, patient lived at home with family support and was independent with ADLs. In to see patient.  Plan is for patient to be discharged home today to self care. No discharge needs identified.

## 2011-07-24 NOTE — Progress Notes (Signed)
Patient ID: RUAIRI BRASUELL, male   DOB: Mar 04, 1951, 61 y.o.   MRN: [id number] Patient ID: RAJVIR BYRDSONG, male   DOB: 1950-12-27, 61 y.o.   MRN: [id number] Patient ID: RAISHAWN NAZAIRE, male   DOB: 12/06/1950, 61 y.o.   MRN: [id number] Patient ID: ARKIE HAGGE, male   DOB: 04-01-1951, 61 y.o.   MRN: [id number]     SUBJECTIVE: No chest pain or dyspnea.  Creatinine improving. Rhythm stable. Voiding okay since Foley out.  Anxious to go home.     . clopidogrel  75 mg Oral Q breakfast  . enoxaparin  40 mg Subcutaneous Q24H  . insulin aspart  0-15 Units Subcutaneous TID WC  . insulin aspart  0-5 Units Subcutaneous QHS  . insulin aspart  8 Units Subcutaneous TID WC  . insulin glargine  30 Units Subcutaneous QHS  . isosorbide mononitrate  60 mg Oral Daily  . metoprolol  100 mg Oral BID  . DISCONTD: enoxaparin  30 mg Subcutaneous Q24H  . DISCONTD: insulin aspart  0-20 Units Subcutaneous TID WC  . DISCONTD: insulin aspart  0-5 Units Subcutaneous QHS  . DISCONTD: insulin aspart  6 Units Subcutaneous TID WC      Filed Vitals:   07/23/11 1325 07/23/11 2210 07/24/11 0513 07/24/11 0650  BP: 130/60 177/73 167/79 160/100  Pulse: 70 65 66   Temp: 98 F (36.7 C) 98.7 F (37.1 C) 97.7 F (36.5 C)   TempSrc: Oral Oral    Resp: 20 20 20    Height:      Weight:      SpO2: 95% 98% 94%     Intake/Output Summary (Last 24 hours) at 07/24/11 0835 Last data filed at 07/24/11 0600  Gross per 24 hour  Intake   1680 ml  Output    300 ml  Net   1380 ml    LABS: Basic Metabolic Panel:  Basename 07/24/11 0620 07/23/11 0531  NA 143 144  K 4.3 4.3  CL 111 115*  CO2 22 25  GLUCOSE 256* 263*  BUN 43* 50*  CREATININE 1.58* 1.60*  CALCIUM 9.7 9.1  MG -- --  PHOS -- --   Liver Function Tests: No results found for this basename: AST:2,ALT:2,ALKPHOS:2,BILITOT:2,PROT:2,ALBUMIN:2 in the last 72 hours No results found for this basename: LIPASE:2,AMYLASE:2 in the last 72 hours CBC:  Basename 07/24/11 0620  07/23/11 0531  WBC 6.5 5.9  NEUTROABS -- --  HGB 11.1* 11.1*  HCT 34.6* 34.6*  MCV 96.9 97.5  PLT 139* 121*   Cardiac Enzymes:  Basename 07/22/11 1200 07/21/11 1552  CKTOTAL 416* 1179*  CKMB 7.2* 8.9*  CKMBINDEX -- --  TROPONINI 0.45* 0.74*   BNP: No components found with this basename: POCBNP:3 D-Dimer: No results found for this basename: DDIMER:2 in the last 72 hours Hemoglobin A1C: No results found for this basename: HGBA1C in the last 72 hours Fasting Lipid Panel: No results found for this basename: CHOL,HDL,LDLCALC,TRIG,CHOLHDL,LDLDIRECT in the last 72 hours Thyroid Function Tests: No results found for this basename: TSH,T4TOTAL,FREET3,T3FREE,THYROIDAB in the last 72 hours Anemia Panel: No results found for this basename: VITAMINB12,FOLATE,FERRITIN,TIBC,IRON,RETICCTPCT in the last 72 hours  RADIOLOGY: Dg Chest 2 View  07/19/2011  *RADIOLOGY REPORT*  Clinical Data: Shortness of breath.  High blood pressure. Diabetic.  Nonsmoker.  CHEST - 2 VIEW  Comparison: 04/17/2009.  Findings: Cardiomegaly.  Pulmonary vascular congestion.  Tortuous aorta.  Shoulder joint degenerative changes.  IMPRESSION: Cardiomegaly.  Pulmonary vascular congestion most notable centrally.  Tortuous  aorta.  Original Report Authenticated By: Doug Sou, M.D.    PHYSICAL EXAM General: NAD Neck: No JVD, no thyromegaly or thyroid nodule.  Lungs: Clear to auscultation bilaterally with normal respiratory effort. CV: Nondisplaced PMI.  Heart regular S1/S2, no [id number], 1/6 systolic murmur upper sternal border.  No peripheral edema.  No carotid bruit.  Normal pedal pulses.  Abdomen: Soft, nontender, no hepatosplenomegaly, no distention.  Neurologic: Alert and oriented x 3.  Psych: Normal affect. Extremities: No clubbing or cyanosis.   TELEMETRY: Reviewed telemetry pt in NSR:  ASSESSMENT AND PLAN: 61 yo admitted with hyperglycemic hyperosmolar state, denies prior knowledge of diabetes.  He was noted to  be in ARF with creatinine > 4.  He had CK elevated to > 3000, TnI to 1.47.  No chest pain.  Echo with normal EF, severe LVH with some LVOT obstruction and mitral valve SAM.  1. Elevated troponin: In setting of ARF and hyperosmolar nonketotic state.  Has risk factors for CAD.  No chest pain.  Highest suspicion for demand ischemia.  Will plan outpatient myoview after discharge. - Continue Plavix (ASA allergy), beta blocker.  - Statin when CK comes down.  2.  Diastolic CHF: Does not appear volume overloaded.  Has long-standing HTN with severe LVH.  Has dynamic LVOT obstruction.  Tolerating Lopressor 100 mg BID 3. ARF: Improving 4. Hypernatremia: Resolved  Probable discharge today.  See me or Truitt Merle NP in office 1-2 weeks. Check BMET and cardiac enzymes then and add statin. Subsequent to OV will schedule Myoview. Continue plavix.  Allergic to ASA.   Darlin Coco 07/24/2011 8:35 AM

## 2011-07-25 NOTE — Progress Notes (Signed)
Pt received all discharge instructions and prescriptions prior to shift change by day shift RN. Pt's IV had already been removed and telemetry monitor taken off prior to shift change also. Pt voiced no concerns or questions regarding discharge instructions he had received earlier. Pt taken to front lobby via wheelchair by Dola Argyle RN for discharge by car with a friend.

## 2011-07-30 ENCOUNTER — Ambulatory Visit (INDEPENDENT_AMBULATORY_CARE_PROVIDER_SITE_OTHER): Payer: 59 | Admitting: Cardiology

## 2011-07-30 ENCOUNTER — Encounter: Payer: Self-pay | Admitting: Cardiology

## 2011-07-30 VITALS — BP 144/80 | HR 86 | Resp 19 | Ht 68.0 in | Wt 194.8 lb

## 2011-07-30 DIAGNOSIS — I119 Hypertensive heart disease without heart failure: Secondary | ICD-10-CM

## 2011-07-30 DIAGNOSIS — N179 Acute kidney failure, unspecified: Secondary | ICD-10-CM

## 2011-07-30 DIAGNOSIS — I421 Obstructive hypertrophic cardiomyopathy: Secondary | ICD-10-CM

## 2011-07-30 DIAGNOSIS — E1101 Type 2 diabetes mellitus with hyperosmolarity with coma: Secondary | ICD-10-CM

## 2011-07-30 DIAGNOSIS — E7800 Pure hypercholesterolemia, unspecified: Secondary | ICD-10-CM

## 2011-07-30 DIAGNOSIS — E118 Type 2 diabetes mellitus with unspecified complications: Secondary | ICD-10-CM

## 2011-07-30 NOTE — Assessment & Plan Note (Signed)
The patient is not presently on simvastatin because of his recent rhabdomyolysis.  We will plan to restart his statin therapy at a later date

## 2011-07-30 NOTE — Progress Notes (Signed)
Robert Chaney Date of Birth:  05-22-1951 [facility] [address] [place], [place]  [address] [phone]         Fax   [phone]  History of Present Illness: This pleasant 61 year old gentleman who is a native of [place] comes in for a post hospital office visit.  He was discharged 6 days ago from [facility] after being admitted there on 07/19/11 with diabetic hyperosmolar nonketotic state associated with acute on chronic renal failure and an elevated troponin level.  He has a known abnormal EKG.  He had volume depletion and has a history of uncontrolled high blood pressure and he had elevated CPK levels contributing to his renal insufficiency.  His hydrochlorothiazide lisinopril and simvastatin were stopped during the hospitalization and he was treated with an insulin drip and subsequently transitioned to Lantus insulin and NovoLog insulin.  Since discharge from the hospital he has had no chest pain.  Current Outpatient Prescriptions  Medication Sig Dispense Refill  . Alcohol Swabs PADS 1 each by Does not apply route once.  100 each  3  . amLODipine (NORVASC) 5 MG tablet Take 2 tablets (10 mg total) by mouth daily.  30 tablet  11  . clopidogrel (PLAVIX) 75 MG tablet Take 1 tablet (75 mg total) by mouth daily with breakfast.  30 tablet  0  . glucose blood (GLUCOMETER ELITE TEST STRIPS) test strip Use as instructed  100 each  12  . glucose monitoring kit (FREESTYLE) monitoring kit 1 each by Does not apply route as needed for other.  1 each  0  . insulin aspart (NOVOLOG) 100 UNIT/ML injection Inject 10 Units into the skin 3 (three) times daily with meals.      . insulin glargine (LANTUS) 100 UNIT/ML injection Inject 36 Units into the skin at bedtime.      . Insulin Syringe-Needle U-100 (INSULIN SYRINGE 1CC/28G) 28G X 1/2" 1 ML MISC 1 each by Does not apply route once.  10 each  3  . isosorbide mononitrate (IMDUR) 60 MG 24 hr tablet Take 1 tablet (60 mg  total) by mouth daily.  30 tablet  11  . Lancets (ACCU-CHEK SOFT TOUCH) lancets Use as instructed  100 each  12  . metoprolol (LOPRESSOR) 100 MG tablet TAKE ONE TABLET BY MOUTH TWICE DAILY  60 tablet  11  . nitroGLYCERIN (NITROSTAT) 0.4 MG SL tablet Place 0.4 mg under the tongue as needed.          Allergies  Allergen Reactions  . Aspirin     itching    Patient Active Problem List  Diagnoses  . Hypercholesterolemia  . Benign hypertensive heart disease without heart failure  . Diabetic hyperosmolar non-ketotic state  . DM (diabetes mellitus) with complications  . Acute on chronic kidney failure  . Troponin level elevated  . Abnormal EKG  . Hyperkalemia  . Volume depletion  . Diastolic CHF, chronic  . HTN (hypertension), benign  . Elevated CPK    History  Smoking status  . Never Smoker   Smokeless tobacco  . Never Used    History  Alcohol Use No    No family history on file.  Review of Systems: Constitutional: no fever chills diaphoresis or fatigue or change in weight.  Head and neck: no hearing loss, no epistaxis, no photophobia or visual disturbance. Respiratory: No cough, shortness of breath or wheezing. Cardiovascular: No chest pain peripheral edema, palpitations. Gastrointestinal: No abdominal distention, no abdominal pain,  no change in bowel habits hematochezia or melena. Genitourinary: No dysuria, no frequency, no urgency, no nocturia. Musculoskeletal:No arthralgias, no back pain, no gait disturbance or myalgias. Neurological: No dizziness, no headaches, no numbness, no seizures, no syncope, no weakness, no tremors. Hematologic: No lymphadenopathy, no easy bruising. Psychiatric: No confusion, no hallucinations, no sleep disturbance.    Physical Exam: Filed Vitals:   07/30/11 1516  BP: 144/80  Pulse: 86  Resp: 19   the general appearance reveals a well-developed well-nourished gentleman in no distress.Pupils equal and reactive.   Extraocular Movements  are full.  There is no scleral icterus.  The mouth and pharynx are normal.  The neck is supple.  The carotids reveal no bruits.  The jugular venous pressure is normal.  The thyroid is not enlarged.  There is no lymphadenopathy.  The chest is clear to percussion and auscultation. There are no rales or rhonchi. Expansion of the chest is symmetrical.  Heart reveals a grade 2/6 systolic ejection murmur at the base consistent with his known IHSS. The abdomen is soft and nontender. Bowel sounds are normal. The liver and spleen are not enlarged. There Are no abdominal masses. There are no bruits.  The pedal pulses are good.  There is no phlebitis or edema.  There is no cyanosis or clubbing. Strength is normal and symmetrical in all extremities.  There is no lateralizing weakness.  There are no sensory deficits.  The skin is warm and dry.  There is no rash.    Assessment / Plan: Patient is to continue same cardiac medications.  We have increased his insulin dosages until he is able to see his new medical doctor.  His diabetes will be followed by his internist at [facility]. He will return here in one month for followup office visit EKG and basal metabolic panel.  After that we will consider timing of his nuclear stress test.

## 2011-07-30 NOTE — Patient Instructions (Signed)
Increase your Lantus to 36 units at bedtime Increase you Novolog to 10 units three times a day Ok to return to work on Friday 08/02/11 Your physician recommends that you schedule a follow-up appointment in: 1 month ov/ekg/bmet

## 2011-07-30 NOTE — Assessment & Plan Note (Signed)
The patient states that he was voiding adequately at the present time

## 2011-07-30 NOTE — Assessment & Plan Note (Addendum)
The patient brought with him his results of diabetic testing since discharge.  His blood sugars are still running in the range of 300-400.  He has not had any hypoglycemic episodes.  We will increase his Lantus insulin to 36 units at bedtime and we will increase NovoLog to 10 units 3 times a day with meals.

## 2011-08-20 ENCOUNTER — Ambulatory Visit: Payer: 59 | Admitting: Cardiology

## 2011-09-16 ENCOUNTER — Encounter: Payer: Self-pay | Admitting: Cardiology

## 2011-09-16 ENCOUNTER — Ambulatory Visit (INDEPENDENT_AMBULATORY_CARE_PROVIDER_SITE_OTHER): Payer: 59 | Admitting: Cardiology

## 2011-09-16 VITALS — BP 150/80 | HR 69 | Ht 68.0 in | Wt 197.0 lb

## 2011-09-16 DIAGNOSIS — I119 Hypertensive heart disease without heart failure: Secondary | ICD-10-CM

## 2011-09-16 DIAGNOSIS — R011 Cardiac murmur, unspecified: Secondary | ICD-10-CM

## 2011-09-16 DIAGNOSIS — I5032 Chronic diastolic (congestive) heart failure: Secondary | ICD-10-CM

## 2011-09-16 DIAGNOSIS — I509 Heart failure, unspecified: Secondary | ICD-10-CM

## 2011-09-16 MED ORDER — AMLODIPINE BESYLATE 10 MG PO TABS: 10.0000 mg | ORAL_TABLET | Freq: Every day | ORAL | Status: DC

## 2011-09-16 NOTE — Patient Instructions (Signed)
Decrease your Simvastatin to 20 mg daily  Your physician wants you to follow-up in: 4 months You will receive a reminder letter in the mail two months in advance. If you don't receive a letter, please call our office to schedule the follow-up appointment.

## 2011-09-16 NOTE — Assessment & Plan Note (Signed)
The patient is not having any symptoms of congestive heart failure

## 2011-09-16 NOTE — Progress Notes (Signed)
Robert Chaney Date of Birth:  [birth year] [facility] [address] [place], [place]  [address] [phone]  Fax   [phone]  HPI: This pleasant 61 year old gentleman is seen back for a followup office visit.  He was recently hospitalized at [facility] for diabetic hyperosmolar nonketotic state associated with acute on chronic renal failure and an elevated troponin level. He responded to treatment for his volume depletion.  His blood pressure in the hospital was markedly elevated despite his dehydration.  While in the hospital because of his renal insufficiency his hydrochlorothiazide lisinopril and simvastatin were stopped.  He is now on insulin for his diabetes.  He has returned to work.  He has been feeling well.  His blood sugars have been running in the range of 97.  He denies any hypoglycemic symptoms.  He's not having any chest pain shortness of breath or dizziness.  He is back on his simvastatin  Current Outpatient Prescriptions  Medication Sig Dispense Refill  . Alcohol Swabs PADS 1 each by Does not apply route once.  100 each  3  . amLODipine (NORVASC) 10 MG tablet Take 1 tablet (10 mg total) by mouth daily.  90 tablet  3  . clopidogrel (PLAVIX) 75 MG tablet Take 1 tablet (75 mg total) by mouth daily with breakfast.  30 tablet  0  . glucose blood (GLUCOMETER ELITE TEST STRIPS) test strip Use as instructed  100 each  12  . glucose monitoring kit (FREESTYLE) monitoring kit 1 each by Does not apply route as needed for other.  1 each  0  . insulin aspart (NOVOLOG) 100 UNIT/ML injection Inject 10 Units into the skin 3 (three) times daily with meals.      . insulin glargine (LANTUS) 100 UNIT/ML injection Inject 36 Units into the skin at bedtime.      . Insulin Syringe-Needle U-100 (INSULIN SYRINGE 1CC/28G) 28G X 1/2" 1 ML MISC 1 each by Does not apply route once.  10 each  3  . isosorbide mononitrate (IMDUR) 60 MG 24 hr tablet Take 1 tablet (60 mg total) by  mouth daily.  30 tablet  11  . Lancets (ACCU-CHEK SOFT TOUCH) lancets Use as instructed  100 each  12  . metoprolol (LOPRESSOR) 100 MG tablet TAKE ONE TABLET BY MOUTH TWICE DAILY  60 tablet  11  . nitroGLYCERIN (NITROSTAT) 0.4 MG SL tablet Place 0.4 mg under the tongue as needed.        . simvastatin (ZOCOR) 40 MG tablet Take 40 mg by mouth. 1/2 daily      . timolol (TIMOPTIC) 0.25 % ophthalmic solution as directed.        Allergies  Allergen Reactions  . Aspirin     itching    Patient Active Problem List  Diagnoses  . Hypercholesterolemia  . Benign hypertensive heart disease without heart failure  . Diabetic hyperosmolar non-ketotic state  . DM (diabetes mellitus) with complications  . Acute on chronic kidney failure  . Troponin level elevated  . Abnormal EKG  . Hyperkalemia  . Volume depletion  . Diastolic CHF, chronic  . HTN (hypertension), benign  . Elevated CPK    History  Smoking status  . Never Smoker   Smokeless tobacco  . Never Used    History  Alcohol Use No    No family history on file.  Review of Systems: The patient denies any heat or cold intolerance.  No weight gain or weight loss.  The patient denies headaches or blurry vision.  There is no cough or sputum production.  The patient denies dizziness.  There is no hematuria or hematochezia.  The patient denies any muscle aches or arthritis.  The patient denies any rash.  The patient denies frequent falling or instability.  There is no history of depression or anxiety.  All other systems were reviewed and are negative.   Physical Exam: Filed Vitals:   09/16/11 1631  BP: 150/80  Pulse: 69   The general appearance reveals a well-developed well-nourished gentleman in no distress.The head and neck exam reveals pupils equal and reactive.  Extraocular movements are full.  There is no scleral icterus.  The mouth and pharynx are normal.  The neck is supple.  The carotids reveal no bruits.  The jugular venous  pressure is normal.  The  thyroid is not enlarged.  There is no lymphadenopathy.  The chest is clear to percussion and auscultation.  There are no rales or rhonchi.  Expansion of the chest is symmetrical.  The precordium is quiet.  The first heart sound is normal.  The second heart sound is physiologically split.  There is no murmur gallop rub or click.  There is no abnormal lift or heave.  The abdomen is soft and nontender.  The bowel sounds are normal.  The liver and spleen are not enlarged.  There are no abdominal masses.  There are no abdominal bruits.  Extremities reveal good pedal pulses.  There is no phlebitis or edema.  There is no cyanosis or clubbing.  Strength is normal and symmetrical in all extremities.  There is no lateralizing weakness.  There are no sensory deficits.  The skin is warm and dry.  There is no rash.  EKG shows normal sinus rhythm and shows improvement in his lateral wall T-wave inversion   Assessment / Plan: The patient will continue same medication except decrease simvastatin to just 20 mg daily since he is also on amlodipine

## 2011-09-16 NOTE — Assessment & Plan Note (Signed)
Blood pressure is stable on current therapy.

## 2012-12-18 ENCOUNTER — Encounter: Payer: Self-pay | Admitting: Cardiology

## 2013-05-19 ENCOUNTER — Encounter: Payer: Self-pay | Admitting: Cardiology

## 2013-05-19 ENCOUNTER — Ambulatory Visit (INDEPENDENT_AMBULATORY_CARE_PROVIDER_SITE_OTHER): Payer: 59 | Admitting: Cardiology

## 2013-05-19 VITALS — BP 170/80 | HR 71 | Ht 68.0 in | Wt 186.0 lb

## 2013-05-19 DIAGNOSIS — I421 Obstructive hypertrophic cardiomyopathy: Secondary | ICD-10-CM

## 2013-05-19 DIAGNOSIS — E785 Hyperlipidemia, unspecified: Secondary | ICD-10-CM

## 2013-05-19 DIAGNOSIS — I5032 Chronic diastolic (congestive) heart failure: Secondary | ICD-10-CM

## 2013-05-19 DIAGNOSIS — I509 Heart failure, unspecified: Secondary | ICD-10-CM

## 2013-05-19 DIAGNOSIS — I119 Hypertensive heart disease without heart failure: Secondary | ICD-10-CM

## 2013-05-19 NOTE — Assessment & Plan Note (Signed)
Blood pressure here in the office today was elevated.  On recheck it had come down to 170/80.  When he checks it at work or at home his 130/80  Range.  He is not having any headaches or dizzy spells

## 2013-05-19 NOTE — Progress Notes (Signed)
Robert Chaney Date of Birth:  12-Feb-1951 [address] [place], [place]  [address] [phone]  Fax   [phone]  HPI: This pleasant 62 year old gentleman is seen back for a followup office visit.  He previously was hospitalized several years ago at [facility] for diabetic hyperosmolar nonketotic state associated with acute on chronic renal failure and an elevated troponin level. He responded to treatment for his volume depletion.  His blood pressure in the hospital was markedly elevated despite his dehydration.  While in the hospital because of his renal insufficiency his hydrochlorothiazide lisinopril and simvastatin were stopped.  He no longer takes his insulin for diabetes and his blood sugars have been remaining low.  He has returned to work.  He has been feeling well.  His blood sugars have been running in the range of 97.  He denies any hypoglycemic symptoms.  He's not having any chest pain shortness of breath or dizziness.  He is back on his simvastatin.  He is not having any side effects from his simvastatin  Current Outpatient Prescriptions  Medication Sig Dispense Refill  . Alcohol Swabs PADS 1 each by Does not apply route once.  100 each  3  . amLODipine (NORVASC) 10 MG tablet Take 1 tablet (10 mg total) by mouth daily.  90 tablet  3  . insulin aspart (NOVOLOG) 100 UNIT/ML injection Inject 10 Units into the skin 3 (three) times daily with meals.      . insulin glargine (LANTUS) 100 UNIT/ML injection Inject 36 Units into the skin at bedtime.      . Insulin Syringe-Needle U-100 (INSULIN SYRINGE 1CC/28G) 28G X 1/2" 1 ML MISC 1 each by Does not apply route once.  10 each  3  . isosorbide mononitrate (IMDUR) 60 MG 24 hr tablet Take 1 tablet (60 mg total) by mouth daily.  30 tablet  11  . metoprolol (LOPRESSOR) 100 MG tablet TAKE ONE TABLET BY MOUTH TWICE DAILY  60 tablet  11  . nitroGLYCERIN (NITROSTAT) 0.4 MG SL tablet Place 0.4 mg under the tongue as needed.         . simvastatin (ZOCOR) 40 MG tablet Take 40 mg by mouth. 1/2 daily      . timolol (TIMOPTIC) 0.25 % ophthalmic solution as directed.       No current facility-administered medications for this visit.    Allergies  Allergen Reactions  . Aspirin     itching    Patient Active Problem List   Diagnosis Date Noted  . Dyslipidemia 05/19/2013  . Diabetic hyperosmolar non-ketotic state 07/19/2011  . DM (diabetes mellitus) with complications [id number]  . Acute on chronic kidney failure 07/19/2011  . Troponin level elevated 07/19/2011  . Abnormal EKG 07/19/2011  . Hyperkalemia 07/19/2011  . Volume depletion 07/19/2011  . Diastolic CHF, chronic Q000111Q  . HTN (hypertension), benign 07/19/2011  . Elevated CPK 07/19/2011  . Hypercholesterolemia 02/20/2011  . 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 Never Smoker   Smokeless tobacco  . Never Used    History  Alcohol Use No    No family history on file.  Review of Systems: The patient denies any heat or cold intolerance.  No weight gain or weight loss.  The patient denies headaches or blurry vision.  There is no cough or sputum production.  The patient denies dizziness.  There is no hematuria or hematochezia.  The patient  denies any muscle aches or arthritis.  The patient denies any rash.  The patient denies frequent falling or instability.  There is no history of depression or anxiety.  All other systems were reviewed and are negative.   Physical Exam: Filed Vitals:   05/19/13 1505  BP: 170/80  Pulse: 71   The general appearance reveals a well-developed well-nourished gentleman in no distress.The head and neck exam reveals pupils equal and reactive.  Extraocular movements are full.  There is no scleral icterus.  The mouth and pharynx are normal.  The neck is supple.  The carotids reveal no bruits.  The jugular venous pressure is normal.  The  thyroid is not enlarged.   There is no lymphadenopathy.  The chest is clear to percussion and auscultation.  There are no rales or rhonchi.  Expansion of the chest is symmetrical.  The precordium is quiet.  The first heart sound is normal.  The second heart sound is physiologically split.  There is no murmur gallop rub or click.  There is no abnormal lift or heave.  The abdomen is soft and nontender.  The bowel sounds are normal.  The liver and spleen are not enlarged.  There are no abdominal masses.  There are no abdominal bruits.  Extremities reveal good pedal pulses.  There is no phlebitis or edema.  There is no cyanosis or clubbing.  Strength is normal and symmetrical in all extremities.  There is no lateralizing weakness.  There are no sensory deficits.  The skin is warm and dry.  There is no rash.  EKG shows normal sinus rhythm and shows improvement in his lateral wall T-wave inversion since 09/16/11.   Assessment / Plan: Patient is to continue same medication.  His electrocardiogram shows further improvement in his lateral T-wave abnormalities since 09/16/11.  Continue on prudent diet.  Recheck in 4 months for office visit lipid panel hepatic function panel basal metabolic panel and hemoglobin A1c

## 2013-05-19 NOTE — Assessment & Plan Note (Signed)
He has a history of dyslipidemia.  He ate shortly before his arrival today so we will not draw blood work today

## 2013-05-19 NOTE — Patient Instructions (Signed)
Your physician recommends that you schedule FASTING BLOOD WORK & follow-up appointment in 4 months with Dr Mare Ferrari.  Your physician recommends that you continue on your current medications as directed. Please refer to the Current Medication list given to you today.

## 2013-05-19 NOTE — Assessment & Plan Note (Signed)
He is not having symptoms of congestive heart failure.  His weight is down 11 pounds since last visit.

## 2013-09-02 ENCOUNTER — Other Ambulatory Visit: Payer: 59

## 2013-09-09 ENCOUNTER — Other Ambulatory Visit (INDEPENDENT_AMBULATORY_CARE_PROVIDER_SITE_OTHER): Payer: 59 | Admitting: *Deleted

## 2013-09-09 DIAGNOSIS — I421 Obstructive hypertrophic cardiomyopathy: Secondary | ICD-10-CM

## 2013-09-09 DIAGNOSIS — I119 Hypertensive heart disease without heart failure: Secondary | ICD-10-CM

## 2013-09-09 DIAGNOSIS — I5032 Chronic diastolic (congestive) heart failure: Secondary | ICD-10-CM

## 2013-09-09 DIAGNOSIS — I509 Heart failure, unspecified: Secondary | ICD-10-CM

## 2013-09-09 DIAGNOSIS — E785 Hyperlipidemia, unspecified: Secondary | ICD-10-CM

## 2013-09-09 LAB — HEPATIC FUNCTION PANEL
ALBUMIN: 3.5 g/dL (ref 3.5–5.2)
ALT: 15 U/L (ref 0–53)
AST: 12 U/L (ref 0–37)
Alkaline Phosphatase: 72 U/L (ref 39–117)
Bilirubin, Direct: 0.1 mg/dL (ref 0.0–0.3)
TOTAL PROTEIN: 7.2 g/dL (ref 6.0–8.3)
Total Bilirubin: 0.9 mg/dL (ref 0.3–1.2)

## 2013-09-09 LAB — BASIC METABOLIC PANEL
BUN: 17 mg/dL (ref 6–23)
CO2: 27 mEq/L (ref 19–32)
Calcium: 8.5 mg/dL (ref 8.4–10.5)
Chloride: 106 mEq/L (ref 96–112)
Creatinine, Ser: 1.4 mg/dL (ref 0.4–1.5)
GFR: 67.72 mL/min (ref >60.00)
Glucose, Bld: 124 mg/dL — ABNORMAL HIGH (ref 70–99)
POTASSIUM: 3.8 meq/L (ref 3.5–5.1)
SODIUM: 140 meq/L (ref 135–145)

## 2013-09-09 LAB — LIPID PANEL
CHOLESTEROL: 188 mg/dL (ref 0–200)
HDL: 48.6 mg/dL (ref >39.00)
LDL CALC: 123 mg/dL — AB (ref 0–99)
Total CHOL/HDL Ratio: 4
Triglycerides: 81 mg/dL (ref 0.0–149.0)
VLDL: 16.2 mg/dL (ref 0.0–40.0)

## 2013-09-09 LAB — HEMOGLOBIN A1C: HEMOGLOBIN A1C: 6.9 % — AB (ref 4.6–6.5)

## 2013-09-10 ENCOUNTER — Telehealth: Payer: Self-pay | Admitting: Cardiology

## 2013-09-10 NOTE — Telephone Encounter (Signed)
New message     Returning a nurses call from yesterday.  OK to leave msg on vm.

## 2013-09-10 NOTE — Telephone Encounter (Signed)
Left message to call back  

## 2013-09-10 NOTE — Telephone Encounter (Signed)
Message copied by Earvin Hansen on Fri Sep 10, 2013  8:30 AM ------      Message from: Darlin Coco      Created: Thu Sep 09, 2013  3:07 PM       Please report.  Diabetic control is much improved.  The A1c has dropped from 12.0 down to 6.9.  Blood sugar is down to 124.  The kidney tests are normal.  The liver tests are normal.  The LDL cholesterol is still above target for diabetic patients.  We can't go any higher on the simvastatin because of his amlodipine.  I would like him to stop simvastatin and start generic Lipitor 20 mg daily.  We can discuss further at his April office visit if he would like. ------

## 2013-09-10 NOTE — Telephone Encounter (Signed)
Follow up     Returned a nurses call.  Ok to leave a message on his vm.  He will be in/out this pm.

## 2013-09-13 ENCOUNTER — Ambulatory Visit: Payer: 59 | Admitting: Cardiology

## 2013-09-28 ENCOUNTER — Other Ambulatory Visit: Payer: Self-pay | Admitting: *Deleted

## 2013-09-28 MED ORDER — ATORVASTATIN CALCIUM 20 MG PO TABS: 20.0000 mg | ORAL_TABLET | Freq: Every day | ORAL | Status: DC

## 2013-09-28 NOTE — Telephone Encounter (Signed)
Advised patient of labs and medication changes, verbalized understanding.

## 2013-10-18 ENCOUNTER — Encounter: Payer: Self-pay | Admitting: Cardiology

## 2013-10-18 ENCOUNTER — Ambulatory Visit (INDEPENDENT_AMBULATORY_CARE_PROVIDER_SITE_OTHER): Payer: 59 | Admitting: Cardiology

## 2013-10-18 VITALS — BP 216/100 | HR 74 | Ht 68.0 in | Wt 187.0 lb

## 2013-10-18 DIAGNOSIS — I119 Hypertensive heart disease without heart failure: Secondary | ICD-10-CM

## 2013-10-18 DIAGNOSIS — I421 Obstructive hypertrophic cardiomyopathy: Secondary | ICD-10-CM

## 2013-10-18 DIAGNOSIS — I5032 Chronic diastolic (congestive) heart failure: Secondary | ICD-10-CM

## 2013-10-18 DIAGNOSIS — I509 Heart failure, unspecified: Secondary | ICD-10-CM

## 2013-10-18 DIAGNOSIS — E118 Type 2 diabetes mellitus with unspecified complications: Secondary | ICD-10-CM

## 2013-10-18 DIAGNOSIS — E785 Hyperlipidemia, unspecified: Secondary | ICD-10-CM

## 2013-10-18 NOTE — Assessment & Plan Note (Signed)
The patient is not having any symptoms of CHF

## 2013-10-18 NOTE — Assessment & Plan Note (Signed)
Blood sugars have been remaining reasonably low on no insulin at this time

## 2013-10-18 NOTE — Patient Instructions (Signed)
Your physician recommends that you continue on your current medications as directed. Please refer to the Current Medication list given to you today.  Your physician wants you to follow-up in: 6 months with fasting labs (lp/bmet/hfp) AND EKG  You will receive a reminder letter in the mail two months in advance. If you don't receive a letter, please call our office to schedule the follow-up appointment.  

## 2013-10-18 NOTE — Progress Notes (Signed)
Robert Chaney Date of Birth:  11-16-1950 [address] [place], [place]  [address] [phone]  Fax   [phone]  HPI: This pleasant 63 year old gentleman is seen back for a followup office visit.  He previously was hospitalized several years ago at [facility] for diabetic hyperosmolar nonketotic state associated with acute on chronic renal failure and an elevated troponin level. He responded to treatment for his volume depletion.  His blood pressure in the hospital was markedly elevated despite his dehydration.  While in the hospital because of his renal insufficiency his hydrochlorothiazide lisinopril and simvastatin were stopped.  He no longer takes his insulin for diabetes and his blood sugars have been remaining low.  He has returned to work.  He has been feeling well.  His blood sugars have been running in the range of 97.  He denies any hypoglycemic symptoms.  He's not having any chest pain shortness of breath or dizziness.  Since we last saw him his simvastatin was switched to Lipitor because of the need for a higher dose statin and he was on amlodipine.  Current Outpatient Prescriptions  Medication Sig Dispense Refill  . Alcohol Swabs PADS 1 each by Does not apply route once.  100 each  3  . amLODipine (NORVASC) 10 MG tablet Take 1 tablet (10 mg total) by mouth daily.  90 tablet  3  . atorvastatin (LIPITOR) 20 MG tablet Take 1 tablet (20 mg total) by mouth daily.  90 tablet  3  . Insulin Syringe-Needle U-100 (INSULIN SYRINGE 1CC/28G) 28G X 1/2" 1 ML MISC 1 each by Does not apply route once.  10 each  3  . isosorbide mononitrate (IMDUR) 60 MG 24 hr tablet Take 1 tablet (60 mg total) by mouth daily.  30 tablet  11  . metoprolol (LOPRESSOR) 100 MG tablet TAKE ONE TABLET BY MOUTH TWICE DAILY  60 tablet  11  . nitroGLYCERIN (NITROSTAT) 0.4 MG SL tablet Place 0.4 mg under the tongue as needed.        . timolol (TIMOPTIC) 0.25 % ophthalmic solution as directed.       . insulin aspart (NOVOLOG) 100 UNIT/ML injection Inject 10 Units into the skin 3 (three) times daily with meals.      . insulin glargine (LANTUS) 100 UNIT/ML injection Inject 36 Units into the skin at bedtime.       No current facility-administered medications for this visit.    Allergies  Allergen Reactions  . Aspirin     itching    Patient Active Problem List   Diagnosis Date Noted  . Dyslipidemia 05/19/2013  . Diabetic hyperosmolar non-ketotic state 07/19/2011  . DM (diabetes mellitus) with complications Q000111Q  . Acute on chronic kidney failure 07/19/2011  . Troponin level elevated 07/19/2011  . Abnormal EKG 07/19/2011  . Hyperkalemia 07/19/2011  . Volume depletion 07/19/2011  . Diastolic CHF, chronic Q000111Q  . HTN (hypertension), benign 07/19/2011  . Elevated CPK 07/19/2011  . Hypercholesterolemia 02/20/2011  . 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 Never Smoker   Smokeless tobacco  . Never Used    History  Alcohol Use No    No family history on file.  Review of Systems: The patient denies any heat or cold intolerance.  No weight gain or weight loss.  The patient denies headaches or blurry vision.  There is no cough or sputum production.  The patient  denies dizziness.  There is no hematuria or hematochezia.  The patient denies any muscle aches or arthritis.  The patient denies any rash.  The patient denies frequent falling or instability.  There is no history of depression or anxiety.  All other systems were reviewed and are negative.   Physical Exam: Filed Vitals:   10/18/13 1125  BP: 216/100  Pulse: 74   The general appearance reveals a well-developed well-nourished gentleman in no distress.The head and neck exam reveals pupils equal and reactive.  Extraocular movements are full.  There is no scleral icterus.  The mouth and pharynx are normal.  The neck is supple.  The carotids reveal no  bruits.  The jugular venous pressure is normal.  The  thyroid is not enlarged.  There is no lymphadenopathy.  The chest is clear to percussion and auscultation.  There are no rales or rhonchi.  Expansion of the chest is symmetrical.  The precordium is quiet.  The first heart sound is normal.  The second heart sound is physiologically split.  There is no murmur gallop rub or click.  There is no abnormal lift or heave.  The abdomen is soft and nontender.  The bowel sounds are normal.  The liver and spleen are not enlarged.  There are no abdominal masses.  There are no abdominal bruits.  Extremities reveal good pedal pulses.  There is no phlebitis or edema.  There is no cyanosis or clubbing.  Strength is normal and symmetrical in all extremities.  There is no lateralizing weakness.  There are no sensory deficits.  The skin is warm and dry.  There is no rash.     Assessment / Plan: Continue on same medication.  His murmur of IHSS is soft.  He is avoiding dehydration.  His labs from March 20 15 showed LDL of 123 and he is now on Lipitor. Recheck in 6 months for office visit EKG lipid panel hepatic function panel and basal metabolic panel

## 2013-10-18 NOTE — Assessment & Plan Note (Signed)
Blood pressure today on arrival was very high.  However he had just been on call last night and had not taken any of his morning medicines today.  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  His usual blood pressure is in the 146/88 range.

## 2013-12-02 ENCOUNTER — Telehealth: Payer: Self-pay | Admitting: Cardiology

## 2013-12-02 NOTE — Telephone Encounter (Signed)
For additional blood pressure control, start HCTZ 12.5 mg 1 every other day

## 2013-12-02 NOTE — Telephone Encounter (Signed)
New message   patient came in the office for testing on eye .   Blood pressure taken  x 3 times on yesterday.    208/113 pulse 69 @ 9:10 am    Taken by MD 196/ 105 take in a dark room with very minimal light.    @ 45 min or 1 hour  Later 203 /106   147/68 taken by patient at night after taken medication.

## 2013-12-02 NOTE — Telephone Encounter (Signed)
Will forward to  Dr. Brackbill for review 

## 2013-12-02 NOTE — Telephone Encounter (Signed)
F/u    Urbano Heir returning your call.

## 2013-12-02 NOTE — Telephone Encounter (Signed)
Left message to call back for patient to call back, spoke with [place]

## 2013-12-03 NOTE — Telephone Encounter (Signed)
Unable to reach patient, forwarded to [place] to try to reach patient next week

## 2013-12-07 NOTE — Telephone Encounter (Signed)
Left message on machine for pt to contact the office.   

## 2013-12-08 NOTE — Telephone Encounter (Signed)
Left message on machine for pt to contact the office.   

## 2013-12-08 NOTE — Telephone Encounter (Signed)
Wasn't sure when I called pt's number who was on the other line.  T/w jancinta at eye care and gave me the correct number for the pt.  Tried to call [facility]mov[facility].  Will try again later

## 2013-12-09 ENCOUNTER — Other Ambulatory Visit: Payer: Self-pay | Admitting: *Deleted

## 2013-12-09 MED ORDER — HYDROCHLOROTHIAZIDE 12.5 MG PO CAPS: 12.5000 mg | ORAL_CAPSULE | ORAL | Status: DC

## 2013-12-09 NOTE — Telephone Encounter (Signed)
Pt is aware and agreeable to plan will start HCTZ (12.5 mg) every other day, was sent in today to [facility] on [address]

## 2013-12-09 NOTE — Telephone Encounter (Signed)
Left message on machine for pt to contact the office.   

## 2014-05-04 ENCOUNTER — Other Ambulatory Visit: Payer: 59

## 2014-05-04 ENCOUNTER — Other Ambulatory Visit (INDEPENDENT_AMBULATORY_CARE_PROVIDER_SITE_OTHER): Payer: 59 | Admitting: *Deleted

## 2014-05-04 ENCOUNTER — Ambulatory Visit: Payer: 59 | Admitting: Cardiology

## 2014-05-04 DIAGNOSIS — I119 Hypertensive heart disease without heart failure: Secondary | ICD-10-CM

## 2014-05-04 DIAGNOSIS — E785 Hyperlipidemia, unspecified: Secondary | ICD-10-CM

## 2014-05-04 LAB — BASIC METABOLIC PANEL
BUN: 22 mg/dL (ref 6–23)
CO2: 21 mEq/L (ref 19–32)
Calcium: 9.2 mg/dL (ref 8.4–10.5)
Chloride: 104 mEq/L (ref 96–112)
Creatinine, Ser: 1.5 mg/dL (ref 0.4–1.5)
GFR: 61.78 mL/min (ref >60.00)
GLUCOSE: 165 mg/dL — AB (ref 70–99)
POTASSIUM: 4 meq/L (ref 3.5–5.1)
Sodium: 139 mEq/L (ref 135–145)

## 2014-05-04 LAB — HEPATIC FUNCTION PANEL
ALBUMIN: 3 g/dL — AB (ref 3.5–5.2)
ALT: 12 U/L (ref 0–53)
AST: 14 U/L (ref 0–37)
Alkaline Phosphatase: 69 U/L (ref 39–117)
BILIRUBIN TOTAL: 0.9 mg/dL (ref 0.2–1.2)
Bilirubin, Direct: 0.1 mg/dL (ref 0.0–0.3)
Total Protein: 7.5 g/dL (ref 6.0–8.3)

## 2014-05-04 LAB — LIPID PANEL
Cholesterol: 128 mg/dL (ref 0–200)
HDL: 37.3 mg/dL — AB (ref >39.00)
LDL CALC: 77 mg/dL (ref 0–99)
NONHDL: 90.7
Total CHOL/HDL Ratio: 3
Triglycerides: 68 mg/dL (ref 0.0–149.0)
VLDL: 13.6 mg/dL (ref 0.0–40.0)

## 2014-05-04 NOTE — Progress Notes (Signed)
Quick Note:  Please report to patient. The recent labs are stable. Continue same medication and careful diet. Cholesterol Is much better. BS is still high. ______

## 2014-05-06 ENCOUNTER — Telehealth: Payer: Self-pay | Admitting: Cardiology

## 2014-05-06 NOTE — Telephone Encounter (Signed)
-----   Message from Darlin Coco, MD sent at 05/04/2014 10:01 PM EST ----- Please report to patient.  The recent labs are stable. Continue same medication and careful diet. Cholesterol  Is much better.  BS is still high.

## 2014-05-06 NOTE — Telephone Encounter (Signed)
New message     Want lab results---leaving for work now and cannot be reached.  Ok to leave msg on vm

## 2014-05-06 NOTE — Telephone Encounter (Signed)
Advised patient of lab results  

## 2014-05-16 ENCOUNTER — Telehealth: Payer: Self-pay | Admitting: Cardiology

## 2014-05-16 NOTE — Telephone Encounter (Signed)
ROI Mailed to Pt Home Address

## 2014-10-18 ENCOUNTER — Other Ambulatory Visit: Payer: Self-pay | Admitting: Cardiology

## 2015-01-03 ENCOUNTER — Other Ambulatory Visit: Payer: Self-pay | Admitting: Cardiology

## 2015-01-16 ENCOUNTER — Encounter: Payer: Self-pay | Admitting: Physician Assistant

## 2015-01-16 ENCOUNTER — Ambulatory Visit (INDEPENDENT_AMBULATORY_CARE_PROVIDER_SITE_OTHER): Payer: BLUE CROSS/BLUE SHIELD | Admitting: Physician Assistant

## 2015-01-16 VITALS — BP 162/96 | HR 62 | Ht 68.0 in | Wt 190.0 lb

## 2015-01-16 DIAGNOSIS — I5032 Chronic diastolic (congestive) heart failure: Secondary | ICD-10-CM | POA: Diagnosis not present

## 2015-01-16 DIAGNOSIS — I10 Essential (primary) hypertension: Secondary | ICD-10-CM

## 2015-01-16 DIAGNOSIS — E7800 Pure hypercholesterolemia, unspecified: Secondary | ICD-10-CM

## 2015-01-16 DIAGNOSIS — I119 Hypertensive heart disease without heart failure: Secondary | ICD-10-CM

## 2015-01-16 MED ORDER — ISOSORBIDE MONONITRATE ER 60 MG PO TB24: 60.0000 mg | ORAL_TABLET | Freq: Every day | ORAL | Status: DC

## 2015-01-16 MED ORDER — AMLODIPINE BESYLATE 10 MG PO TABS: 10.0000 mg | ORAL_TABLET | Freq: Every day | ORAL | Status: DC

## 2015-01-16 MED ORDER — ATORVASTATIN CALCIUM 20 MG PO TABS: 20.0000 mg | ORAL_TABLET | Freq: Every day | ORAL | Status: DC

## 2015-01-16 MED ORDER — METOPROLOL TARTRATE 100 MG PO TABS: 100.0000 mg | ORAL_TABLET | Freq: Two times a day (BID) | ORAL | Status: DC

## 2015-01-16 MED ORDER — HYDROCHLOROTHIAZIDE 12.5 MG PO CAPS: 12.5000 mg | ORAL_CAPSULE | ORAL | Status: DC

## 2015-01-16 MED ORDER — NITROGLYCERIN 0.4 MG SL SUBL: 0.4000 mg | SUBLINGUAL_TABLET | SUBLINGUAL | Status: DC | PRN

## 2015-01-16 NOTE — Assessment & Plan Note (Signed)
Will need fasting lipids and LFTs in November.

## 2015-01-16 NOTE — Assessment & Plan Note (Signed)
No evidence of heart failure and exam. 

## 2015-01-16 NOTE — Progress Notes (Signed)
Cardiology Office Note   Date:  01/16/2015   ID:  Robert Chaney, DOB Nov 26, 1950, MRN [id number]  PCP:  Philis Fendt, MD  Cardiologist:  Dr. Mare Ferrari  Chief Complaint: High blood pressure    History of Present Illness: Robert Chaney is a 64 y.o. male who presents for yearly follow-up. He has history of hypertension, hyperlipidemia, diabetes mellitus, and diastolic heart failure on 2-D echo in 2010.  The patient ran out of all his medications about a week ago. His blood pressure has been elevated. 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 When he is on his blood pressure medications his values are usually 138/80. It's been running 160/90 the past week.  Past Medical History  Diagnosis Date  . Hyperlipidemia     HYPERCHOLESTEROLEMIA  . Anemia   . Diabetes mellitus   . Hypertension     MARKED LEFT VENTRICULAR HYPERTROPHY BY PREVIOUS ECHOCARDIOGRAM--HE HAS HYPERDYNAMIC LEFT VENTRICULAR SYSTOLIC FUNCTION AND HAS IMPAIRED RELAXATION BY ECHO  . Diastolic CHF, chronic     A.  03/2009 Echo: EF 60-65%, Gr II diast dysfxn  . Murmur     A.  Mild AI by 10/10 echo    Past Surgical History  Procedure Laterality Date  . No past surgeries       Current Outpatient Prescriptions  Medication Sig Dispense Refill  . amLODipine (NORVASC) 10 MG tablet Take 1 tablet (10 mg total) by mouth daily. 90 tablet 3  . atorvastatin (LIPITOR) 20 MG tablet TAKE ONE TABLET BY MOUTH ONCE DAILY 30 tablet 0  . B-D ULTRAFINE III SHORT PEN 31G X 8 MM MISC Inject 10 Units as directed 3 (three) times daily.     . hydrochlorothiazide (MICROZIDE) 12.5 MG capsule TAKE ONE CAPSULE BY MOUTH EVERY OTHER DAY 30 capsule 0  . isosorbide mononitrate (IMDUR) 60 MG 24 hr tablet Take 1 tablet (60 mg total) by mouth daily. 30 tablet 11  . metoprolol (LOPRESSOR) 100 MG tablet TAKE ONE TABLET BY MOUTH TWICE DAILY 60 tablet 11  . nitroGLYCERIN (NITROSTAT) 0.4 MG SL tablet Place 0.4 mg under the  tongue as needed.      . Alcohol Swabs PADS 1 each by Does not apply route once. 100 each 3  . Insulin Syringe-Needle U-100 (INSULIN SYRINGE 1CC/28G) 28G X 1/2" 1 ML MISC 1 each by Does not apply route once. 10 each 3   No current facility-administered medications for this visit.    Allergies:   Aspirin    Social History:  The patient  reports that he has never smoked. He has never used smokeless tobacco. He reports that he does not drink alcohol or use illicit drugs.   Family History:  The patient's   family history is not on file.    ROS:  Please see the history of present illness.   Otherwise, review of systems are positive for snoring, visual changes.   All other systems are reviewed and negative.    PHYSICAL EXAM: VS:  BP 162/96 mmHg  Pulse 62  Ht 5\' 8"  (1.727 m)  Wt 190 lb (86.183 kg)  BMI 28.90 kg/m2 , BMI Body mass index is 28.9 kg/(m^2). GEN: Well nourished, well developed, in no acute distress Neck: no JVD, HJR, carotid bruits, or masses Cardiac: RRR; positive S4, no murmurs, rubs, thrill or heave,  Respiratory:  clear to auscultation bilaterally, normal work of breathing GI: soft, nontender, nondistended, + BS MS: no deformity  or atrophy Extremities: without cyanosis, clubbing, edema, good distal pulses bilaterally.  Skin: warm and dry, no rash Neuro:  Strength and sensation are intact    EKG:  EKG is ordered today. The ekg ordered today demonstrates normal sinus rhythm with LVH repolarization changes   Recent Labs: 05/04/2014: ALT 12; BUN 22; Creatinine, Ser 1.5; Potassium 4.0; Sodium 139    Lipid Panel    Component Value Date/Time   CHOL 128 05/04/2014 0852   TRIG 68.0 05/04/2014 0852   HDL 37.30* 05/04/2014 0852   CHOLHDL 3 05/04/2014 0852   VLDL 13.6 05/04/2014 0852   LDLCALC 77 05/04/2014 0852      Wt Readings from Last 3 Encounters:  01/16/15 190 lb (86.183 kg)  10/18/13 187 lb (84.823 kg)  05/19/13 186 lb (84.369 kg)      Other studies  Reviewed: Additional studies/ records that were reviewed today include and review of the records demonstrates:  2-D echo 2013 Study Conclusions  - Left ventricle: The cavity size was normal. There was   severe concentric hypertrophy. Systolic function was   normal. The estimated ejection fraction was in the range   of 60% to 65%. There was dynamic obstruction. Wall motion   was normal; there were no regional wall motion   abnormalities. Doppler parameters are consistent with   abnormal left ventricular relaxation (grade 1 diastolic   dysfunction). - Mitral valve: There was systolic anterior motion. Transthoracic echocardiography.  M-mode, complete 2D, spectral Doppler, and color Doppler.  Blood pressure: 145/99.  Patient status:  Inpatient.  Location:  Bedside.   ------------------------------------------------------------    ASSESSMENT AND PLAN: HTN (hypertension), benign Blood pressure is elevated today because he ran out of his medications a week ago. We'll refill all his medications. Continue Norvasc, HCTZ, Imdur, metoprolol. He is to call if his blood pressures stay elevated once he resumes his medications. Follow-up with Dr. Mare Ferrari in November. Will need blood work at that time.  Diastolic CHF, chronic No evidence of heart failure and exam.  Hypercholesterolemia Will need fasting lipids and LFTs in November.     Sumner Boast, PA-C  01/16/2015 12:34 PM    [facility] [address], [place], [place]  [address] Phone: [phone]; Fax: [phone]

## 2015-01-16 NOTE — Patient Instructions (Signed)
Medication Instructions:  Refilled all medications sent into patient's requested pharmacy  Labwork: -None  Testing/Procedures: -None  Follow-Up: Your physician recommends that you keep your scheduled follow-up appointment in: November   Any Other Special Instructions Will Be Listed Below (If Applicable).  Low-Sodium Eating Plan Sodium raises blood pressure and causes water to be held in the body. Getting less sodium from food will help lower your blood pressure, reduce any swelling, and protect your heart, liver, and kidneys. We get sodium by adding salt (sodium chloride) to food. Most of our sodium comes from canned, boxed, and frozen foods. Restaurant foods, fast foods, and pizza are also very high in sodium. Even if you take medicine to lower your blood pressure or to reduce fluid in your body, getting less sodium from your food is important. WHAT IS MY PLAN? Most people should limit their sodium intake to 2,300 mg a day. Your health care provider recommends that you limit your sodium intake to ___2 grams_______ a day.  WHAT DO I NEED TO KNOW ABOUT THIS EATING PLAN? For the low-sodium eating plan, you will follow these general guidelines:  Choose foods with a % Daily Value for sodium of less than 5% (as listed on the food label).   Use salt-free seasonings or herbs instead of table salt or sea salt.   Check with your health care provider or pharmacist before using salt substitutes.   Eat fresh foods.  Eat more vegetables and fruits.  Limit canned vegetables. If you do use them, rinse them well to decrease the sodium.   Limit cheese to 1 oz (28 g) per day.   Eat lower-sodium products, often labeled as "lower sodium" or "no salt added."  Avoid foods that contain monosodium glutamate (MSG). MSG is sometimes added to [place] food and some canned foods.  Check food labels (Nutrition Facts labels) on foods to learn how much sodium is in one serving.  Eat more  home-cooked food and less restaurant, buffet, and fast food.  When eating at a restaurant, ask that your food be prepared with less salt or none, if possible.  HOW DO I READ FOOD LABELS FOR SODIUM INFORMATION? The Nutrition Facts label lists the amount of sodium in one serving of the food. If you eat more than one serving, you must multiply the listed amount of sodium by the number of servings. Food labels may also identify foods as:  Sodium free--Less than 5 mg in a serving.  Very low sodium--35 mg or less in a serving.  Low sodium--140 mg or less in a serving.  Light in sodium--50% less sodium in a serving. For example, if a food that usually has 300 mg of sodium is changed to become light in sodium, it will have 150 mg of sodium.  Reduced sodium--25% less sodium in a serving. For example, if a food that usually has 400 mg of sodium is changed to reduced sodium, it will have 300 mg of sodium. WHAT FOODS CAN I EAT? Grains Low-sodium cereals, including oats, puffed wheat and rice, and shredded wheat cereals. Low-sodium crackers. Unsalted rice and pasta. Lower-sodium bread.  Vegetables Frozen or fresh vegetables. Low-sodium or reduced-sodium canned vegetables. Low-sodium or reduced-sodium tomato sauce and paste. Low-sodium or reduced-sodium tomato and vegetable juices.  Fruits Fresh, frozen, and canned fruit. Fruit juice.  Meat and Other Protein Products Low-sodium canned tuna and salmon. Fresh or frozen meat, poultry, seafood, and fish. Lamb. Unsalted nuts. Dried beans, peas, and lentils without added salt. Unsalted  canned beans. Homemade soups without salt. Eggs.  Dairy Milk. Soy milk. Ricotta cheese. Low-sodium or reduced-sodium cheeses. Yogurt.  Condiments Fresh and dried herbs and spices. Salt-free seasonings. Onion and garlic powders. Low-sodium varieties of mustard and ketchup. Lemon juice.  Fats and Oils Reduced-sodium salad dressings. Unsalted butter.   Other Unsalted popcorn and pretzels.  The items listed above may not be a complete list of recommended foods or beverages. Contact your dietitian for more options. WHAT FOODS ARE NOT RECOMMENDED? Grains Instant hot cereals. Bread stuffing, pancake, and biscuit mixes. Croutons. Seasoned rice or pasta mixes. Noodle soup cups. Boxed or frozen macaroni and cheese. Self-rising flour. Regular salted crackers. Vegetables Regular canned vegetables. Regular canned tomato sauce and paste. Regular tomato and vegetable juices. Frozen vegetables in sauces. Salted french fries. Olives. Angie Fava. Relishes. Sauerkraut. Salsa. Meat and Other Protein Products Salted, canned, smoked, spiced, or pickled meats, seafood, or fish. Bacon, ham, sausage, hot dogs, corned beef, chipped beef, and packaged luncheon meats. Salt pork. Jerky. Pickled herring. Anchovies, regular canned tuna, and sardines. Salted nuts. Dairy Processed cheese and cheese spreads. Cheese curds. Blue cheese and cottage cheese. Buttermilk.  Condiments Onion and garlic salt, seasoned salt, table salt, and sea salt. Canned and packaged gravies. Worcestershire sauce. Tartar sauce. Barbecue sauce. Teriyaki sauce. Soy sauce, including reduced sodium. Steak sauce. Fish sauce. Oyster sauce. Cocktail sauce. Horseradish. Regular ketchup and mustard. Meat flavorings and tenderizers. Bouillon cubes. Hot sauce. Tabasco sauce. Marinades. Taco seasonings. Relishes. Fats and Oils Regular salad dressings. Salted butter. Margarine. Ghee. Bacon fat.  Other Potato and tortilla chips. Corn chips and puffs. Salted popcorn and pretzels. Canned or dried soups. Pizza. Frozen entrees and pot pies.  The items listed above may not be a complete list of foods and beverages to avoid. Contact your dietitian for more information. Document Released: 11/30/2001 Document Revised: 06/15/2013 Document Reviewed: 04/14/2013 [facility] Patient Information 2015 [place], [place].  This information is not intended to replace advice given to you by your health care provider. Make sure you discuss any questions you have with your health care provider.

## 2015-01-16 NOTE — Assessment & Plan Note (Signed)
Blood pressure is elevated today because he ran out of his medications a week ago. We'll refill all his medications. Continue Norvasc, HCTZ, Imdur, metoprolol. He is to call if his blood pressures stay elevated once he resumes his medications. Follow-up with Dr. Mare Ferrari in November. Will need blood work at that time.

## 2015-05-09 ENCOUNTER — Ambulatory Visit: Payer: BLUE CROSS/BLUE SHIELD | Admitting: Cardiology

## 2015-05-09 ENCOUNTER — Encounter: Payer: Self-pay | Admitting: Cardiology

## 2015-05-29 ENCOUNTER — Ambulatory Visit: Payer: BLUE CROSS/BLUE SHIELD | Admitting: Cardiology

## 2015-06-30 ENCOUNTER — Telehealth: Payer: Self-pay | Admitting: Cardiology

## 2015-06-30 NOTE — Telephone Encounter (Signed)
New message      *STAT* If patient is at the pharmacy, call can be transferred to refill team.   1. Which medications need to be refilled? (please list name of each medication and dose if known)  Toujeo 400 unit    2. Which pharmacy/location (including street and city if local pharmacy) is medication to be sent to? [facility] - [address]   3. Do they need a 30 day or 90 day supply? 90 days

## 2015-06-30 NOTE — Telephone Encounter (Signed)
Called pt and left message informing him that this medication Toujeo 400 units is not on his med list and that he could give our office a call back or contact his PCP to follow up with his office.

## 2015-07-13 ENCOUNTER — Encounter: Payer: Self-pay | Admitting: Cardiology

## 2015-07-13 ENCOUNTER — Telehealth: Payer: Self-pay | Admitting: Cardiology

## 2015-07-13 NOTE — Telephone Encounter (Signed)
Called pt and left message for pt to call back to update his Fm and medication Hx.

## 2015-07-26 ENCOUNTER — Encounter: Payer: Self-pay | Admitting: Cardiology

## 2015-07-26 ENCOUNTER — Ambulatory Visit (INDEPENDENT_AMBULATORY_CARE_PROVIDER_SITE_OTHER): Payer: PPO | Admitting: Cardiology

## 2015-07-26 VITALS — BP 168/70 | HR 68 | Ht 68.0 in | Wt 194.8 lb

## 2015-07-26 DIAGNOSIS — I5032 Chronic diastolic (congestive) heart failure: Secondary | ICD-10-CM | POA: Diagnosis not present

## 2015-07-26 DIAGNOSIS — E7800 Pure hypercholesterolemia, unspecified: Secondary | ICD-10-CM

## 2015-07-26 DIAGNOSIS — I421 Obstructive hypertrophic cardiomyopathy: Secondary | ICD-10-CM | POA: Diagnosis not present

## 2015-07-26 DIAGNOSIS — I10 Essential (primary) hypertension: Secondary | ICD-10-CM | POA: Diagnosis not present

## 2015-07-26 DIAGNOSIS — I422 Other hypertrophic cardiomyopathy: Secondary | ICD-10-CM | POA: Insufficient documentation

## 2015-07-26 NOTE — Patient Instructions (Signed)
Medication Instructions:  Your physician recommends that you continue on your current medications as directed. Please refer to the Current Medication list given to you today.  Labwork: Lp/bmet/hfp on 08/02/15 any time after 7:30 am. Nothing to eat or drink after midnight except water   Testing/Procedures: none  Follow-Up: Your physician wants you to follow-up in: 6 month ov with Dr Johann Capers will receive a reminder letter in the mail two months in advance. If you don't receive a letter, please call our office to schedule the follow-up appointment.  If you need a refill on your cardiac medications before your next appointment, please call your pharmacy.

## 2015-07-26 NOTE — Progress Notes (Signed)
Cardiology Office Note   Date:  07/26/2015   ID:  Robert Chaney, DOB [birth year], MRN [id number]  PCP:  Philis Fendt, MD  Cardiologist: Darlin Coco MD  Chief Complaint  Patient presents with  . routine follow up    hypertension. Denies chest pain, shortness of breath, le edema, or claudication      History of Present Illness: Robert Chaney is a 65 y.o. male who presents for a six-month follow-up visit  Robert Chaney is a 65 y.o. male who presents for yearly follow-up. He has history of hypertension, hyperlipidemia, diabetes mellitus, and diastolic heart failure on 2-D echo in 2010.  Since last visit he has been feeling well.  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  He worked last night.  His blood pressure today in our office is a little high.  Last night he checked it at work and it was normal at 140/62.  Patient denies any chest pain or shortness of breath.  He's had no dizziness or syncope.  He has never had to take sublingual nitroglycerin.  He has diabetes and is followed by his PCP.  He is on insulin.  Past Medical History  Diagnosis Date  . Hyperlipidemia     HYPERCHOLESTEROLEMIA  . Anemia   . Diabetes mellitus   . Hypertension     MARKED LEFT VENTRICULAR HYPERTROPHY BY PREVIOUS ECHOCARDIOGRAM--HE HAS HYPERDYNAMIC LEFT VENTRICULAR SYSTOLIC FUNCTION AND HAS IMPAIRED RELAXATION BY ECHO  . Diastolic CHF, chronic (HCC)     A.  03/2009 Echo: EF 60-65%, Gr II diast dysfxn  . Murmur     A.  Mild AI by 10/10 echo    Past Surgical History  Procedure Laterality Date  . No past surgeries       Current Outpatient Prescriptions  Medication Sig Dispense Refill  . Alcohol Swabs PADS 1 each by Does not apply route once. 100 each 3  . amLODipine (NORVASC) 10 MG tablet Take 1 tablet (10 mg total) by mouth daily. 30 tablet 9  . atorvastatin (LIPITOR) 20 MG tablet Take 1 tablet (20 mg total) by mouth daily. 30 tablet 9  . B-D  ULTRAFINE III SHORT PEN 31G X 8 MM MISC Inject 10 Units as directed 3 (three) times daily.     . hydrochlorothiazide (MICROZIDE) 12.5 MG capsule Take 1 capsule (12.5 mg total) by mouth every other day. 30 capsule 9  . Insulin Glargine (TOUJEO SOLOSTAR) 300 UNIT/ML SOPN Inject 10 Units into the skin at bedtime.    . Insulin Syringe-Needle U-100 (INSULIN SYRINGE 1CC/28G) 28G X 1/2" 1 ML MISC 1 each by Does not apply route once. 10 each 3  . isosorbide mononitrate (IMDUR) 60 MG 24 hr tablet Take 1 tablet (60 mg total) by mouth daily. 30 tablet 9  . metoprolol (LOPRESSOR) 100 MG tablet Take 1 tablet (100 mg total) by mouth 2 (two) times daily. 60 tablet 9  . nitroGLYCERIN (NITROSTAT) 0.4 MG SL tablet Place 0.4 mg under the tongue every 5 (five) minutes as needed for chest pain.     No current facility-administered medications for this visit.    Allergies:   Aspirin    Social History:  The patient  reports that he has never smoked. He has never used smokeless tobacco. He reports that he does not drink alcohol or use illicit drugs.   Family History:  The patient's family history includes Diabetes  in his brother and brother; Hypertension in his father.    ROS:  Please see the history of present illness.   Otherwise, review of systems are positive for none.   All other systems are reviewed and negative.    PHYSICAL EXAM: VS:  BP 168/70 mmHg  Pulse 68  Ht 5\' 8"  (1.727 m)  Wt 194 lb 12.8 oz (88.361 kg)  BMI 29.63 kg/m2 , BMI Body mass index is 29.63 kg/(m^2). GEN: Well nourished, well developed, in no acute distress HEENT: normal Neck: no JVD, carotid bruits, or masses Cardiac: RRR; there is a grade 2/6 systolic ejection murmur at the base. No rubs, or gallops,no edema  Respiratory:  clear to auscultation bilaterally, normal work of breathing GI: soft, nontender, nondistended, + BS MS: no deformity or atrophy Skin: warm and dry, no rash Neuro:  Strength and sensation are intact Psych:  euthymic mood, full affect   EKG:  EKG is not ordered today.    Recent Labs: No results found for requested labs within last 365 days.    Lipid Panel    Component Value Date/Time   CHOL 128 05/04/2014 0852   TRIG 68.0 05/04/2014 0852   HDL 37.30* 05/04/2014 0852   CHOLHDL 3 05/04/2014 0852   VLDL 13.6 05/04/2014 0852   LDLCALC 77 05/04/2014 0852      Wt Readings from Last 3 Encounters:  07/26/15 194 lb 12.8 oz (88.361 kg)  01/16/15 190 lb (86.183 kg)  10/18/13 187 lb (84.823 kg)        ASSESSMENT AND PLAN:  HTN (hypertension), benign Generally his blood pressure has been running in a satisfactory manner.  He has not run out of his medications.  No symptoms. Diastolic CHF, chronic No evidence of heart failure on exam.  Hypercholesterolemia He forgot to fast today.  We will have him return soon for fasting lipid panel hepatic function panel and basal metabolic panel  HOCM No dizziness or syncope.  No chest pain.  Current medicines are reviewed at length with the patient today.  The patient does not have concerns regarding medicines.  The following changes have been made:  no change  Labs/ tests ordered today include:  No orders of the defined types were placed in this encounter.   Addition: Continue current medication.  Recheck in 6 months for follow-up office visit with Dr. Meda Coffee   Signed, Darlin Coco MD 07/26/2015 [address] [place], [place], [place]  [address] Phone: [phone]; Fax: [phone]

## 2015-08-02 ENCOUNTER — Other Ambulatory Visit: Payer: PPO

## 2015-08-16 DIAGNOSIS — E784 Other hyperlipidemia: Secondary | ICD-10-CM | POA: Diagnosis not present

## 2015-08-16 DIAGNOSIS — I10 Essential (primary) hypertension: Secondary | ICD-10-CM | POA: Diagnosis not present

## 2015-08-16 DIAGNOSIS — I509 Heart failure, unspecified: Secondary | ICD-10-CM | POA: Diagnosis not present

## 2015-08-16 DIAGNOSIS — Z719 Counseling, unspecified: Secondary | ICD-10-CM | POA: Diagnosis not present

## 2015-08-16 DIAGNOSIS — E1165 Type 2 diabetes mellitus with hyperglycemia: Secondary | ICD-10-CM | POA: Diagnosis not present

## 2015-08-16 DIAGNOSIS — Z1389 Encounter for screening for other disorder: Secondary | ICD-10-CM | POA: Diagnosis not present

## 2015-08-16 DIAGNOSIS — Z125 Encounter for screening for malignant neoplasm of prostate: Secondary | ICD-10-CM | POA: Diagnosis not present

## 2016-01-12 ENCOUNTER — Telehealth: Payer: Self-pay | Admitting: Cardiology

## 2016-01-12 NOTE — Telephone Encounter (Signed)
New message    Pt want to speak to someone about his lab work. Please call.

## 2016-01-12 NOTE — Telephone Encounter (Signed)
Patient was asking if his doctor's office sent blood work over that he completed about 3 months ago. Informed patient that we have not received any lab results. He will contact ordering physician to discuss.

## 2016-01-19 ENCOUNTER — Ambulatory Visit (INDEPENDENT_AMBULATORY_CARE_PROVIDER_SITE_OTHER): Payer: Worker's Compensation | Admitting: Family Medicine

## 2016-01-19 VITALS — BP 144/80 | HR 74 | Temp 98.9°F | Resp 18 | Ht 68.0 in | Wt 185.0 lb

## 2016-01-19 DIAGNOSIS — B86 Scabies: Secondary | ICD-10-CM

## 2016-01-19 LAB — CBC WITH DIFFERENTIAL/PLATELET
Basophils Absolute: 0 cells/uL (ref 0–200)
Basophils Relative: 0 %
Eosinophils Absolute: 98 cells/uL (ref 15–500)
Eosinophils Relative: 2 %
HCT: 39.1 % (ref 38.5–50.0)
HEMOGLOBIN: 13.2 g/dL (ref 13.2–17.1)
LYMPHS ABS: 1715 {cells}/uL (ref 850–3900)
Lymphocytes Relative: 35 %
MCH: 32.2 pg (ref 27.0–33.0)
MCHC: 33.8 g/dL (ref 32.0–36.0)
MCV: 95.4 fL (ref 80.0–100.0)
MPV: 10.8 fL (ref 7.5–12.5)
Monocytes Absolute: 294 cells/uL (ref 200–950)
Monocytes Relative: 6 %
NEUTROS ABS: 2793 {cells}/uL (ref 1500–7800)
Neutrophils Relative %: 57 %
PLATELETS: 198 10*3/uL (ref 140–400)
RBC: 4.1 MIL/uL — AB (ref 4.20–5.80)
RDW: 13.2 % (ref 11.0–15.0)
WBC: 4.9 10*3/uL (ref 3.8–10.8)

## 2016-01-19 MED ORDER — PERMETHRIN 5 % EX CREA: 1.0000 "application " | TOPICAL_CREAM | Freq: Every day | CUTANEOUS | 0 refills | Status: AC

## 2016-01-19 MED ORDER — IVERMECTIN 3 MG PO TABS: 200.0000 ug/kg | ORAL_TABLET | Freq: Once | ORAL | 0 refills | Status: AC

## 2016-01-19 NOTE — Progress Notes (Signed)
Robert Chaney June 16, 1951 65 y.o.   Chief Complaint  Patient presents with  . 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  He doesn't recall what the patient was diagnosed with.  Entered room several times for period 3 times per week for total 12 hr shift. Initially developed skin rash with itching about 1 week after caring for patient.  The rash spread to hands, elbows, trunk and form papules which has since become excoriated as he scratches. The patient originally thought the itching and papules were the caused by his  prescribed medication and did not initially report to employer.   Patient reports [employer] services engineer approached him 1 week ago and asked if he was experiencing itching. He reported the same sequence of events to his employers Geophysical data processor as described at today visit.  His Special educational needs teacher advised him to seek medical care. He denies taking any medication or applying any ointment to rash.   Review of Systems  Constitutional: Negative.   HENT: Negative.   Eyes: Negative.   Respiratory: Negative.   Cardiovascular: Negative.   Skin: Positive for itching and rash.       See HPI  Neurological: Negative.   Endo/Heme/Allergies: Negative.     Current medications and allergies reviewed and updated. Past medical history, family history, social history have been reviewed and updated.   Physical Exam  Constitutional: He is well-developed, well-nourished, and in no distress.  HENT:  Head: Normocephalic and atraumatic.  Right Ear: External ear normal.  Left Ear: External ear normal.  Eyes: Conjunctivae and EOM are normal.  Neck: Normal  range of motion.  Cardiovascular: Normal rate and normal heart sounds.   Neurological: He is alert.  Skin: Skin is dry. Rash noted. There is erythema.  Combination of excoriated open papules, crusted and dried lesion, and closed papules diffusely position: Bilateral arms, clusters on elbows bilateral, posterior hands bilateral Anterior and posterior upper and lower trunk. Neck, face, head, legs and feet unaffected     Assessment and Plan: 1. Scabies - CBC with Differential-rule out elevated infection  Patient presents today after exposure to unknown source in which he developed itching, skin papules, and some crusted lesion of the trunk, arms, and hands.   Skin lesions are most likely scabies.  Treating empirically with both agents due to patient has some crusting of lesions. Ivermectin 16,500 mcg the following days: Days 1, 2, 8, 9, and 15 Permethrin 5%-1 application daily to the affected area for 7 days. Return to work note July 31st. Return for evaluation of treatment in two weeks.  Carroll Sage. Kenton Kingfisher, MSN, FNP-C [facility]

## 2016-01-19 NOTE — Patient Instructions (Addendum)
IF you received an x-ray today, you will receive an invoice from [facility]. Please contact [facility] at [phone] with questions or concerns regarding your invoice.   IF you received labwork today, you will receive an invoice from [employer]. Please contact [facility] at [phone] with questions or concerns regarding your invoice.   Our billing staff will not be able to assist you with questions regarding bills from these companies.  You will be contacted with the lab results as soon as they are available. The fastest way to get your results is to activate your My Chart account. Instructions are located on the last page of this paperwork. If you have not heard from [place] regarding the results in 2 weeks, please contact this office.    Scabies  Scabies, Adult Scabies is a skin condition that happens when very small insects get under the skin (infestation). This causes a rash and severe itchiness. Scabies can spread from person to person (is contagious). If you get scabies, it is common for others in your household to get scabies too. With proper treatment, symptoms usually go away in 2-4 weeks. Scabies usually does not cause lasting problems. CAUSES This condition is caused by mites (Sarcoptes scabiei, or human itch mites) that can only be seen with a microscope. The mites get into the top layer of skin and lay eggs. Scabies can spread from person to person through:  Close contact with a person who has scabies.  Contact with infested items, such as towels, bedding, or clothing. RISK FACTORS This condition is more likely to develop in:  People who live in nursing homes and other extended-care facilities.  People who have sexual contact with a partner who has scabies.  Young children who attend child care facilities.  People who care for others who are at increased risk for scabies. SYMPTOMS Symptoms of this condition may  include:  Severe itchiness. This is often worse at night.  A rash that includes tiny red bumps or blisters. The rash commonly occurs on the wrist, elbow, armpit, fingers, waist, groin, or buttocks. Bumps may form a line (burrow) in some areas.  Skin irritation. This can include scaly patches or sores. DIAGNOSIS This condition is diagnosed with a physical exam. Your health care provider will look closely at your skin. In some cases, your health care provider may take a sample of your affected skin (skin scraping) and have it examined under a microscope. TREATMENT This condition may be treated with:  Medicated cream or lotion that kills the mites. This is spread on the entire body and left on for several hours. Usually, one treatment with medicated cream or lotion is enough to kill all of the mites. In severe cases, the treatment may be repeated.  Medicated cream that relieves itching.  Medicines that help to relieve itching.  Medicines that kill the mites. This treatment is rarely used. HOME CARE INSTRUCTIONS Medicines  Take or apply over-the-counter and prescription medicines as told by your health care provider.  Apply medicated cream or lotion as told by your health care provider.  Do not wash off the medicated cream or lotion until the necessary amount of time has passed. Skin Care  Avoid scratching your affected skin.  Keep your fingernails closely trimmed to reduce injury from scratching.  Take cool baths or apply cool washcloths to help reduce itching. General Instructions  Clean all items that you recently had contact with, including bedding, clothing, and furniture. Do  this on the same day that your treatment starts.  Use hot water when you wash items.  Place unwashable items into closed, airtight plastic bags for at least 3 days. The mites cannot live for more than 3 days away from human skin.  Vacuum furniture and mattresses that you use.  Make sure that other  people who may have been infested are examined by a health care provider. These include members of your household and anyone who may have had contact with infested items.  Keep all follow-up visits as told by your health care provider. This is important. SEEK MEDICAL CARE IF:  You have itching that does not go away after 4 weeks of treatment.  You continue to develop new bumps or burrows.  You have redness, swelling, or pain in your rash area after treatment.  You have fluid, blood, or pus coming from your rash.   This information is not intended to replace advice given to you by your health care provider. Make sure you discuss any questions you have with your health care provider.   Document Released: 03/01/2015 Document Reviewed: 01/10/2015 Elsevier Interactive Patient Education [employer].

## 2016-02-09 ENCOUNTER — Other Ambulatory Visit: Payer: Self-pay

## 2016-03-14 ENCOUNTER — Encounter: Payer: Self-pay | Admitting: Cardiology

## 2016-03-29 ENCOUNTER — Encounter: Payer: Self-pay | Admitting: Cardiology

## 2016-03-29 ENCOUNTER — Ambulatory Visit (INDEPENDENT_AMBULATORY_CARE_PROVIDER_SITE_OTHER): Payer: PPO | Admitting: Cardiology

## 2016-03-29 VITALS — BP 206/94 | HR 68 | Ht 68.0 in | Wt 186.0 lb

## 2016-03-29 DIAGNOSIS — I119 Hypertensive heart disease without heart failure: Secondary | ICD-10-CM | POA: Diagnosis not present

## 2016-03-29 DIAGNOSIS — I5032 Chronic diastolic (congestive) heart failure: Secondary | ICD-10-CM | POA: Diagnosis not present

## 2016-03-29 DIAGNOSIS — I421 Obstructive hypertrophic cardiomyopathy: Secondary | ICD-10-CM | POA: Diagnosis not present

## 2016-03-29 DIAGNOSIS — I10 Essential (primary) hypertension: Secondary | ICD-10-CM

## 2016-03-29 DIAGNOSIS — E7800 Pure hypercholesterolemia, unspecified: Secondary | ICD-10-CM

## 2016-03-29 MED ORDER — HYDRALAZINE HCL 25 MG PO TABS: 25.0000 mg | ORAL_TABLET | Freq: Three times a day (TID) | ORAL | 6 refills | Status: DC

## 2016-03-29 MED ORDER — METOPROLOL TARTRATE 100 MG PO TABS: 100.0000 mg | ORAL_TABLET | Freq: Two times a day (BID) | ORAL | 6 refills | Status: DC

## 2016-03-29 MED ORDER — HYDROCHLOROTHIAZIDE 12.5 MG PO CAPS: 12.5000 mg | ORAL_CAPSULE | ORAL | 6 refills | Status: DC

## 2016-03-29 MED ORDER — ISOSORBIDE MONONITRATE ER 60 MG PO TB24: 60.0000 mg | ORAL_TABLET | Freq: Every day | ORAL | 6 refills | Status: DC

## 2016-03-29 MED ORDER — NITROGLYCERIN 0.4 MG SL SUBL: 0.4000 mg | SUBLINGUAL_TABLET | SUBLINGUAL | 2 refills | Status: DC | PRN

## 2016-03-29 MED ORDER — AMLODIPINE BESYLATE 10 MG PO TABS: 10.0000 mg | ORAL_TABLET | Freq: Every day | ORAL | 9 refills | Status: DC

## 2016-03-29 NOTE — Patient Instructions (Signed)
Medication Instructions:   START TAKING HYDRALAZINE 25 MG THREE TIMES DAILY  START TAKING YOUR AMLODIPINE AT NIGHT VS THE MORNING TIME    Testing/Procedures:  Your physician has requested that you have an echocardiogram. Echocardiography is a painless test that uses sound waves to create images of your heart. It provides your doctor with information about the size and shape of your heart and how well your heart's chambers and valves are working. This procedure takes approximately one hour. There are no restrictions for this procedure.    Follow-Up:  Your physician wants you to follow-up in: [place] will receive a reminder letter in the mail two months in advance. If you don't receive a letter, please call our office to schedule the follow-up appointment.     If you need a refill on your cardiac medications before your next appointment, please call your pharmacy.

## 2016-03-29 NOTE — Progress Notes (Signed)
Cardiology Office Note    Date:  03/29/2016   ID:  Robert Chaney, DOB [birth year], MRN [id number]  PCP:  Philis Fendt, MD  Cardiologist:  Ena Dawley, MD   Chief complain: 6 months follow-up   History of Present Illness:  Robert Chaney is a 65 y.o. male who presents for a six-month follow-up visit he is a prior patient of Dr. Mare Ferrari, followed for diabetes, hypertension, hyperlipidemia-circumflex heart failure with last echocardiogram in 2013. 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 In his records it states that he has history of hypertrophic cardiomyopathy, however his most recent echocardiogram shows that he has Severe concentric LVH with LVOT obstruction and evidence of SAM, this was in 2013. He denies any exertional chest pain or shortness of breath no palpitations or syncope. There is no family history of sudden cardiac death or coronary artery disease. He states he has been compliant with his medicines but his blood pressure goes up to 140--150. Today on it on arrival to her clinic blood pressure 206. He states fairly active denies claudications lower extremity edema or orthopnea.   Past Medical History:  Diagnosis Date  . Anemia   . Diabetes mellitus   . Diastolic CHF, chronic (HCC)    A.  03/2009 Echo: EF 60-65%, Gr II diast dysfxn  . Glaucoma   . Hyperlipidemia    HYPERCHOLESTEROLEMIA  . Hypertension    MARKED LEFT VENTRICULAR HYPERTROPHY BY PREVIOUS ECHOCARDIOGRAM--HE HAS HYPERDYNAMIC LEFT VENTRICULAR SYSTOLIC FUNCTION AND HAS IMPAIRED RELAXATION BY ECHO  . Murmur    A.  Mild AI by 10/10 echo    Past Surgical History:  Procedure Laterality Date  . NO PAST SURGERIES      Current Medications: Outpatient Medications Prior to Visit  Medication Sig Dispense Refill  . Alcohol Swabs PADS 1 each by Does not apply route once. 100 each 3  . atorvastatin (LIPITOR) 20 MG tablet Take 1 tablet (20 mg total) by mouth daily. 30  tablet 9  . B-D ULTRAFINE III SHORT PEN 31G X 8 MM MISC Inject 10 Units as directed 3 (three) times daily.     . Insulin Glargine (TOUJEO SOLOSTAR) 300 UNIT/ML SOPN Inject 10 Units into the skin at bedtime.    . Insulin Syringe-Needle U-100 (INSULIN SYRINGE 1CC/28G) 28G X 1/2" 1 ML MISC 1 each by Does not apply route once. 10 each 3  . amLODipine (NORVASC) 10 MG tablet Take 1 tablet (10 mg total) by mouth daily. 30 tablet 9  . hydrochlorothiazide (MICROZIDE) 12.5 MG capsule Take 1 capsule (12.5 mg total) by mouth every other day. 30 capsule 9  . isosorbide mononitrate (IMDUR) 60 MG 24 hr tablet Take 1 tablet (60 mg total) by mouth daily. 30 tablet 9  . metoprolol (LOPRESSOR) 100 MG tablet Take 1 tablet (100 mg total) by mouth 2 (two) times daily. 60 tablet 9  . nitroGLYCERIN (NITROSTAT) 0.4 MG SL tablet Place 0.4 mg under the tongue every 5 (five) minutes as needed for chest pain.     No facility-administered medications prior to visit.      Allergies:   Aspirin   Social History   Social History  . Marital status: Legally Separated    Spouse name: N/A  . Number of children: N/A  . Years of education: N/A   Occupational History  . 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 Smoking status: Never Smoker  . Smokeless tobacco: Never Used  . Alcohol use No  . Drug use: No  . Sexual activity: No   Other Topics Concern  . None   Social History Narrative   Originally from [place].     Family History:  The patient's family history includes Diabetes in his brother and brother; Hypertension in his father.   ROS:   Please see the history of present illness.    ROS All other systems reviewed and are negative.  PHYSICAL EXAM:   VS:  BP (!) 206/94 (BP Location: Right Arm, Patient Position: Sitting, Cuff Size: Normal)   Pulse 68   Ht 5\' 8"  (1.727 m)   Wt 186 lb (84.4 kg)   BMI 28.28 kg/m    GEN: Well nourished, well developed, in no acute  distress  HEENT: normal  Neck: no JVD, carotid bruits, or masses Cardiac: RRR; 2/6 systolic murmur, rubs, or gallops,no edema  Respiratory:  clear to auscultation bilaterally, normal work of breathing GI: soft, nontender, nondistended, + BS MS: no deformity or atrophy  Skin: warm and dry, no rash Neuro:  Alert and Oriented x 3, Strength and sensation are intact Psych: euthymic mood, full affect  Wt Readings from Last 3 Encounters:  03/29/16 186 lb (84.4 kg)  01/19/16 185 lb (83.9 kg)  07/26/15 194 lb 12.8 oz (88.4 kg)     Studies/Labs Reviewed:   EKG:  EKG Shows sinus rhythm, LVH with repolarization abnormalities and negative T waves in inferolateral leads.  Recent Labs: 01/19/2016: Hemoglobin 13.2; Platelets 198   Lipid Panel    Component Value Date/Time   CHOL 128 05/04/2014 0852   TRIG 68.0 05/04/2014 0852   HDL 37.30 (L) 05/04/2014 0852   CHOLHDL 3 05/04/2014 0852   VLDL 13.6 05/04/2014 0852   LDLCALC 77 05/04/2014 0852     ASSESSMENT:    1. 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[place])   2. Benign hypertensive heart disease without heart failure   3. Diastolic CHF, chronic ([place])   4. HTN (hypertension), benign   5. Hypercholesterolemia      PLAN:  In order of problems listed above:  1. Based on most recent echocardiogram it is unclear if patient has hypertrophic cardiomyopathy versus severe concentric LVH based on long-standing hypertension. This is suggested by he is elevated blood pressure despite 4 different blood pressure medicines. We will order another echocardiogram to reevaluate. 2. For his high blood pressure we will add hydralazine 25 mg by mouth twice a day he is also advised to use amlodipine in the morning but Imdur at night. 3. Hyperlipidemia he is on atorvastatin 40 mg daily. His most recent lipid profile in our system was in 2015 it was at goal. He has had it checked at his primary care office a few months ago we will request.  The  patient is instructed to start hydralazine in addition to all of his blood pressure medicine. He will look into my chart and send me couple blood pressure measurements in the next 2 weeks and we will readjust his medication. He will follow-up in 6 months. We will obtainechocardiogram in the meantime.  Medication Adjustments/Labs and Tests Ordered: Current medicines are reviewed at length with the patient today.  Concerns regarding medicines are outlined above.  Medication changes, Labs and Tests ordered today are listed in the Patient Instructions below. Patient Instructions  Medication Instructions:   START TAKING HYDRALAZINE 25 MG THREE TIMES DAILY  START TAKING YOUR AMLODIPINE AT NIGHT VS THE MORNING TIME    Testing/Procedures:  Your physician has requested that you have an echocardiogram. Echocardiography is a painless test that uses sound waves to create images of your heart. It provides your doctor with information about the size and shape of your heart and how well your heart's chambers and valves are working. This procedure takes approximately one hour. There are no restrictions for this procedure.    Follow-Up:  Your physician wants you to follow-up in: [place] will receive a reminder letter in the mail two months in advance. If you don't receive a letter, please call our office to schedule the follow-up appointment.     If you need a refill on your cardiac medications before your next appointment, please call your pharmacy.   Signed, Ena Dawley, MD  03/29/2016 12:45 PM    [address], [place], [place]  [address] Phone: [phone]; Fax: [phone]

## 2016-04-11 ENCOUNTER — Telehealth: Payer: Self-pay | Admitting: Cardiology

## 2016-04-11 ENCOUNTER — Other Ambulatory Visit: Payer: Self-pay

## 2016-04-11 ENCOUNTER — Encounter: Payer: Self-pay | Admitting: Cardiology

## 2016-04-11 ENCOUNTER — Ambulatory Visit (HOSPITAL_COMMUNITY): Payer: PPO | Attending: Cardiology

## 2016-04-11 DIAGNOSIS — I5032 Chronic diastolic (congestive) heart failure: Secondary | ICD-10-CM

## 2016-04-11 DIAGNOSIS — E119 Type 2 diabetes mellitus without complications: Secondary | ICD-10-CM | POA: Insufficient documentation

## 2016-04-11 DIAGNOSIS — E7800 Pure hypercholesterolemia, unspecified: Secondary | ICD-10-CM

## 2016-04-11 DIAGNOSIS — I10 Essential (primary) hypertension: Secondary | ICD-10-CM

## 2016-04-11 DIAGNOSIS — I421 Obstructive hypertrophic cardiomyopathy: Secondary | ICD-10-CM

## 2016-04-11 DIAGNOSIS — I110 Hypertensive heart disease with heart failure: Secondary | ICD-10-CM | POA: Diagnosis not present

## 2016-04-11 DIAGNOSIS — I119 Hypertensive heart disease without heart failure: Secondary | ICD-10-CM | POA: Diagnosis not present

## 2016-04-11 NOTE — Telephone Encounter (Signed)
Walk In Pt Form-BP results dropped off-gave to [employer]

## 2016-04-15 ENCOUNTER — Encounter: Payer: Self-pay | Admitting: Cardiology

## 2016-05-01 DIAGNOSIS — I509 Heart failure, unspecified: Secondary | ICD-10-CM | POA: Diagnosis not present

## 2016-05-01 DIAGNOSIS — E784 Other hyperlipidemia: Secondary | ICD-10-CM | POA: Diagnosis not present

## 2016-05-01 DIAGNOSIS — G44229 Chronic tension-type headache, not intractable: Secondary | ICD-10-CM | POA: Diagnosis not present

## 2016-05-01 DIAGNOSIS — E119 Type 2 diabetes mellitus without complications: Secondary | ICD-10-CM | POA: Diagnosis not present

## 2016-05-01 DIAGNOSIS — I10 Essential (primary) hypertension: Secondary | ICD-10-CM | POA: Diagnosis not present

## 2016-05-01 DIAGNOSIS — Z23 Encounter for immunization: Secondary | ICD-10-CM | POA: Diagnosis not present

## 2016-06-20 ENCOUNTER — Other Ambulatory Visit: Payer: Self-pay | Admitting: Physician Assistant

## 2016-06-20 DIAGNOSIS — E7800 Pure hypercholesterolemia, unspecified: Secondary | ICD-10-CM

## 2016-06-20 DIAGNOSIS — I5032 Chronic diastolic (congestive) heart failure: Secondary | ICD-10-CM

## 2016-06-20 DIAGNOSIS — I119 Hypertensive heart disease without heart failure: Secondary | ICD-10-CM

## 2016-06-20 DIAGNOSIS — I10 Essential (primary) hypertension: Secondary | ICD-10-CM

## 2016-11-07 ENCOUNTER — Telehealth: Payer: Self-pay | Admitting: Cardiology

## 2016-11-07 ENCOUNTER — Ambulatory Visit (INDEPENDENT_AMBULATORY_CARE_PROVIDER_SITE_OTHER): Payer: PPO | Admitting: Cardiology

## 2016-11-07 ENCOUNTER — Encounter (INDEPENDENT_AMBULATORY_CARE_PROVIDER_SITE_OTHER): Payer: Self-pay

## 2016-11-07 ENCOUNTER — Encounter: Payer: Self-pay | Admitting: Cardiology

## 2016-11-07 VITALS — BP 168/102 | HR 66 | Ht 68.0 in | Wt 182.0 lb

## 2016-11-07 DIAGNOSIS — I110 Hypertensive heart disease with heart failure: Secondary | ICD-10-CM | POA: Diagnosis not present

## 2016-11-07 DIAGNOSIS — E7800 Pure hypercholesterolemia, unspecified: Secondary | ICD-10-CM

## 2016-11-07 DIAGNOSIS — E785 Hyperlipidemia, unspecified: Secondary | ICD-10-CM

## 2016-11-07 DIAGNOSIS — I421 Obstructive hypertrophic cardiomyopathy: Secondary | ICD-10-CM

## 2016-11-07 DIAGNOSIS — I119 Hypertensive heart disease without heart failure: Secondary | ICD-10-CM | POA: Insufficient documentation

## 2016-11-07 LAB — COMPREHENSIVE METABOLIC PANEL
ALT: 11 IU/L (ref 0–44)
AST: 14 IU/L (ref 0–40)
Albumin/Globulin Ratio: 1.1 — ABNORMAL LOW (ref 1.2–2.2)
Albumin: 3.7 g/dL (ref 3.6–4.8)
Alkaline Phosphatase: 83 IU/L (ref 39–117)
BUN/Creatinine Ratio: 10 (ref 10–24)
BUN: 15 mg/dL (ref 8–27)
Bilirubin Total: 0.6 mg/dL (ref 0.0–1.2)
CO2: 23 mmol/L (ref 18–29)
Calcium: 9.2 mg/dL (ref 8.6–10.2)
Chloride: 99 mmol/L (ref 96–106)
Creatinine, Ser: 1.45 mg/dL — ABNORMAL HIGH (ref 0.76–1.27)
GFR calc Af Amer: 57 mL/min/{1.73_m2} — ABNORMAL LOW (ref >59)
GFR calc non Af Amer: 49 mL/min/{1.73_m2} — ABNORMAL LOW (ref >59)
Globulin, Total: 3.3 g/dL (ref 1.5–4.5)
Glucose: 107 mg/dL — ABNORMAL HIGH (ref 65–99)
Potassium: 4.6 mmol/L (ref 3.5–5.2)
Sodium: 138 mmol/L (ref 134–144)
Total Protein: 7 g/dL (ref 6.0–8.5)

## 2016-11-07 LAB — CBC WITH DIFFERENTIAL/PLATELET
Basophils Absolute: 0 10*3/uL (ref 0.0–0.2)
Basos: 1 %
EOS (ABSOLUTE): 0.2 10*3/uL (ref 0.0–0.4)
Eos: 3 %
Hematocrit: 35.9 % — ABNORMAL LOW (ref 37.5–51.0)
Hemoglobin: 12.2 g/dL — ABNORMAL LOW (ref 13.0–17.7)
Immature Grans (Abs): 0 10*3/uL (ref 0.0–0.1)
Immature Granulocytes: 0 %
Lymphocytes Absolute: 1.7 10*3/uL (ref 0.7–3.1)
Lymphs: 31 %
MCH: 31.9 pg (ref 26.6–33.0)
MCHC: 34 g/dL (ref 31.5–35.7)
MCV: 94 fL (ref 79–97)
Monocytes Absolute: 0.4 10*3/uL (ref 0.1–0.9)
Monocytes: 7 %
Neutrophils Absolute: 3.3 10*3/uL (ref 1.4–7.0)
Neutrophils: 58 %
Platelets: 233 10*3/uL (ref 150–379)
RBC: 3.83 x10E6/uL — ABNORMAL LOW (ref 4.14–5.80)
RDW: 13.3 % (ref 12.3–15.4)
WBC: 5.7 10*3/uL (ref 3.4–10.8)

## 2016-11-07 LAB — LIPID PANEL
Chol/HDL Ratio: 2.7 ratio (ref 0.0–5.0)
Cholesterol, Total: 157 mg/dL (ref 100–199)
HDL: 59 mg/dL (ref >39)
LDL Calculated: 82 mg/dL (ref 0–99)
Triglycerides: 81 mg/dL (ref 0–149)
VLDL Cholesterol Cal: 16 mg/dL (ref 5–40)

## 2016-11-07 LAB — TSH: TSH: 3.23 u[IU]/mL (ref 0.450–4.500)

## 2016-11-07 NOTE — Patient Instructions (Signed)
Medication Instructions:   Your physician recommends that you continue on your current medications as directed. Please refer to the Current Medication list given to you today.    Labwork:  TODAY--CMET, CBC W DIFF, TSH, AND LIPIDS     Testing/Procedures:  CARDIAC MRI WITH/WITHOUT CONTRAST FOR DR NELSON TO READ---THIS IS FOR HYPERTROPHIC OBSTRUCTIVE CARDIOMYOPATHY     Follow-Up:  Your physician wants you to follow-up in: [place] will receive a reminder letter in the mail two months in advance. If you don't receive a letter, please call our office to schedule the follow-up appointment.        If you need a refill on your cardiac medications before your next appointment, please call your pharmacy.

## 2016-11-07 NOTE — Telephone Encounter (Signed)
Called the patient and left a VM to call me back with preferred days/times for his cardiac MRI.

## 2016-11-07 NOTE — Progress Notes (Signed)
Cardiology Office Note    Date:  11/07/2016   ID:  Robert Chaney, DOB 26-Oct-1950, MRN [id number]  PCP:  [name], MD  Cardiologist:  Ena Dawley, MD   Chief complain: 6 months follow-up   History of Present Illness:  Robert Chaney is a 66 y.o. male who presents for a six-month follow-up visit he is a prior patient of Dr. Mare Ferrari, followed for diabetes, hypertension, diastolic CHF. 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 Based on his most recent echocardiogram in 03/2016 he has high suspicion for HCM with septal hypertrophy of 18 mm. There is no family history of sudden cardiac death or coronary artery disease. He has no living children. He denies any dizziness, palpitations or syncope.  He states fairly active denies claudications lower extremity edema or orthopnea.He checks his blood pressure at home and the highest it has been was 142/80 mmHg.    Past Medical History:  Diagnosis Date  . Anemia   . Diabetes mellitus   . Diastolic CHF, chronic (HCC)    A.  03/2009 Echo: EF 60-65%, Gr II diast dysfxn  . Glaucoma   . Hyperlipidemia    HYPERCHOLESTEROLEMIA  . Hypertension    MARKED LEFT VENTRICULAR HYPERTROPHY BY PREVIOUS ECHOCARDIOGRAM--HE HAS HYPERDYNAMIC LEFT VENTRICULAR SYSTOLIC FUNCTION AND HAS IMPAIRED RELAXATION BY ECHO  . Murmur    A.  Mild AI by 10/10 echo    Past Surgical History:  Procedure Laterality Date  . NO PAST SURGERIES      Current Medications: Outpatient Medications Prior to Visit  Medication Sig Dispense Refill  . Alcohol Swabs PADS 1 each by Does not apply route once. 100 each 3  . amLODipine (NORVASC) 10 MG tablet Take 1 tablet (10 mg total) by mouth at bedtime. 30 tablet 9  . atorvastatin (LIPITOR) 20 MG tablet TAKE ONE TABLET BY MOUTH ONCE DAILY 90 tablet 3  . B-D ULTRAFINE III SHORT PEN 31G X 8 MM MISC Inject 10 Units as directed 3 (three) times daily.     . hydrALAZINE (APRESOLINE) 25 MG  tablet Take 1 tablet (25 mg total) by mouth 3 (three) times daily. 270 tablet 6  . hydrochlorothiazide (MICROZIDE) 12.5 MG capsule Take 1 capsule (12.5 mg total) by mouth every other day. 90 capsule 6  . Insulin Glargine (TOUJEO SOLOSTAR) 300 UNIT/ML SOPN Inject 10 Units into the skin at bedtime.    . Insulin Syringe-Needle U-100 (INSULIN SYRINGE 1CC/28G) 28G X 1/2" 1 ML MISC 1 each by Does not apply route once. 10 each 3  . isosorbide mononitrate (IMDUR) 60 MG 24 hr tablet Take 1 tablet (60 mg total) by mouth daily. 90 tablet 6  . metoprolol (LOPRESSOR) 100 MG tablet Take 1 tablet (100 mg total) by mouth 2 (two) times daily. 180 tablet 6  . nitroGLYCERIN (NITROSTAT) 0.4 MG SL tablet Place 1 tablet (0.4 mg total) under the tongue every 5 (five) minutes as needed for chest pain. 3 tablet 2  . Vitamin D, Ergocalciferol, (DRISDOL) 50000 units CAPS capsule Take 50,000 Units by mouth every 7 (seven) days.     No facility-administered medications prior to visit.      Allergies:   Aspirin   Social History   Social History  . Marital status: Legally Separated    Spouse name: N/A  . Number of children: N/A  . Years of education: N/A   Occupational History  . 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 Smoking status: Never Smoker  . Smokeless tobacco: Never Used  . Alcohol use No  . Drug use: No  . Sexual activity: No   Other Topics Concern  . None   Social History Narrative   Originally from [place].     Family History:  The patient's family history includes Diabetes in his brother and brother; Hypertension in his father.   ROS:   Please see the history of present illness.    ROS All other systems reviewed and are negative.  PHYSICAL EXAM:   VS:  BP (!) 168/102 (BP Location: Right Arm, Patient Position: Sitting, Cuff Size: Normal)   Pulse 66   Ht 5\' 8"  (1.727 m)   Wt 182 lb (82.6 kg)   BMI 27.67 kg/m    GEN: Well nourished, well  developed, in no acute distress  HEENT: normal  Neck: no JVD, carotid bruits, or masses Cardiac: RRR; 1/6 systolic murmur, rubs, or gallops,no edema  Respiratory:  clear to auscultation bilaterally, normal work of breathing GI: soft, nontender, nondistended, + BS MS: no deformity or atrophy  Skin: warm and dry, no rash Neuro:  Alert and Oriented x 3, Strength and sensation are intact Psych: euthymic mood, full affect  Wt Readings from Last 3 Encounters:  11/07/16 182 lb (82.6 kg)  03/29/16 186 lb (84.4 kg)  01/19/16 185 lb (83.9 kg)     Studies/Labs Reviewed:   Recent Labs: 01/19/2016: Hemoglobin 13.2; Platelets 198   Lipid Panel    Component Value Date/Time   CHOL 128 05/04/2014 0852   TRIG 68.0 05/04/2014 0852   HDL 37.30 (L) 05/04/2014 0852   CHOLHDL 3 05/04/2014 0852   VLDL 13.6 05/04/2014 0852   LDLCALC 77 05/04/2014 0852   EKG performed today 11/07/2016 was personally reviewed and shows normal sinus rhythm with LVH with repolarization abnormalities and is unchanged from prior.   ASSESSMENT:    1. 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[place])   2. Hypercholesterolemia   3. Dyslipidemia   4. Hypertensive heart disease with congestive heart failure, unspecified heart failure type ([place])     PLAN:  In order of problems listed above:  1. Hypertrophic cardiomyopathy, most recent echo consistent with hypertrophic cardiomyopathy a septal thickness 18 mm without significant gradient and no SAM. We will obtain cardiac MRI to further risk stratify. 2. Hypertension - again uncontrolled however controlled at home he will send [place] measurements from home as this can be whitecoat syndrome. 3. Hyperlipidemia he is on atorvastatin 40 mg daily. We will recheck today including CMP.  The patient is instructed to start hydralazine in addition to all of his blood pressure medicine. He will look into my chart and send me couple blood pressure measurements in the next 2 weeks and  we will readjust his medication. He will follow-up in 6 months. We will obtainechocardiogram in the meantime.  Medication Adjustments/Labs and Tests Ordered: Current medicines are reviewed at length with the patient today.  Concerns regarding medicines are outlined above.  Medication changes, Labs and Tests ordered today are listed in the Patient Instructions below. There are no Patient Instructions on file for this visit.Signed, Ena Dawley, MD  11/07/2016 9:08 AM    [address], [place],   [address] Phone: [phone]; Fax: [phone]

## 2016-11-08 ENCOUNTER — Telehealth: Payer: Self-pay | Admitting: Cardiology

## 2016-11-08 NOTE — Telephone Encounter (Signed)
New message    Pt returning call to [facility].

## 2016-11-08 NOTE — Telephone Encounter (Signed)
Called patient to get time he would like his cardiac MRI.  He would like an early morning appointment.  Message to technician and precert.

## 2016-11-11 ENCOUNTER — Encounter: Payer: Self-pay | Admitting: *Deleted

## 2016-11-12 ENCOUNTER — Telehealth: Payer: Self-pay | Admitting: Cardiology

## 2016-11-12 NOTE — Telephone Encounter (Signed)
Patient received lab results, per Dr. Meda Coffee lab work is stable. Patient also voiced that he needed to speak to [facility]. Will forward message.

## 2016-11-12 NOTE — Telephone Encounter (Signed)
Called the patient and gave him date, time and location of cardiac MRI.

## 2016-11-12 NOTE — Telephone Encounter (Signed)
New message    Pt is calling returning call about blood work.

## 2016-11-13 ENCOUNTER — Telehealth: Payer: Self-pay | Admitting: *Deleted

## 2016-11-13 NOTE — Telephone Encounter (Signed)
Pts cardiac MRI is scheduled for 5/25 at 0800.  Pt made aware of appt date and time by [facility] scheduling.

## 2016-11-15 ENCOUNTER — Ambulatory Visit (HOSPITAL_COMMUNITY)
Admission: RE | Admit: 2016-11-15 | Discharge: 2016-11-15 | Disposition: A | Discharge status: Home / Self Care | Payer: PPO | Source: Ambulatory Visit | Attending: Cardiology | Admitting: Cardiology

## 2016-11-15 DIAGNOSIS — I081 Rheumatic disorders of both mitral and tricuspid valves: Secondary | ICD-10-CM | POA: Diagnosis not present

## 2016-11-15 DIAGNOSIS — I422 Other hypertrophic cardiomyopathy: Secondary | ICD-10-CM | POA: Diagnosis not present

## 2016-11-15 DIAGNOSIS — I421 Obstructive hypertrophic cardiomyopathy: Secondary | ICD-10-CM | POA: Insufficient documentation

## 2016-11-15 MED ORDER — GADOBENATE DIMEGLUMINE 529 MG/ML IV SOLN
27.0000 mL | Freq: Once | INTRAVENOUS | Status: AC | PRN
  Administered 2016-11-15: 27 mL via INTRAVENOUS

## 2016-11-21 ENCOUNTER — Telehealth: Payer: Self-pay | Admitting: Cardiology

## 2016-11-21 DIAGNOSIS — I421 Obstructive hypertrophic cardiomyopathy: Secondary | ICD-10-CM

## 2016-11-21 NOTE — Telephone Encounter (Signed)
Will follow-up with the pt tomorrow, as requested.

## 2016-11-21 NOTE — Telephone Encounter (Signed)
New message       Calling to get test results.  Pt is leaving for work.  He will call back tomorrow to speak to the nurse

## 2016-11-22 NOTE — Telephone Encounter (Signed)
Spoke with the pt and informed him that per Dr Meda Coffee, the patient has evidence for hypertrophic cardiomyopathy, and she recommends that we order for him to have a 48 hour Holter monitor.  Informed the pt that I will place the order for this test in the system and have one of our [facility] schedulers call him back to arrange this appt.  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  Pt verbalized understanding and agrees with this plan.

## 2016-11-22 NOTE — Telephone Encounter (Signed)
Follow up  Patient calling MRI test done on 5/25 - calling for test results.

## 2016-12-05 ENCOUNTER — Ambulatory Visit (INDEPENDENT_AMBULATORY_CARE_PROVIDER_SITE_OTHER): Payer: PPO

## 2016-12-05 DIAGNOSIS — I421 Obstructive hypertrophic cardiomyopathy: Secondary | ICD-10-CM

## 2016-12-13 DIAGNOSIS — E784 Other hyperlipidemia: Secondary | ICD-10-CM | POA: Diagnosis not present

## 2016-12-13 DIAGNOSIS — I10 Essential (primary) hypertension: Secondary | ICD-10-CM | POA: Diagnosis not present

## 2016-12-13 DIAGNOSIS — E119 Type 2 diabetes mellitus without complications: Secondary | ICD-10-CM | POA: Diagnosis not present

## 2016-12-13 DIAGNOSIS — Z125 Encounter for screening for malignant neoplasm of prostate: Secondary | ICD-10-CM | POA: Diagnosis not present

## 2016-12-13 DIAGNOSIS — I509 Heart failure, unspecified: Secondary | ICD-10-CM | POA: Diagnosis not present

## 2016-12-20 ENCOUNTER — Telehealth: Payer: Self-pay | Admitting: Cardiology

## 2016-12-20 NOTE — Telephone Encounter (Signed)
Patient returning your call, thanks. °

## 2016-12-20 NOTE — Telephone Encounter (Signed)
Pt aware of holter monitor results and plan, per Dr Meda Coffee. Informed the pt that Dr Meda Coffee would like for him to be scheduled with EP Dr Caryl Comes, for further discussion and plan on newly diagnosed hypertrophic cardiomyopathy and abnormal holter monitor results. Informed the pt that Dr Olin Pia scheduler Lenna Sciara, will call him in the near future to have this appt made.  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  Pt verbalized understanding and agrees with this plan.  Spoke with Frederick, and she will call the pt next Tuesday before 2 pm, to have his new pt appt with Dr Caryl Comes arranged.

## 2017-01-03 ENCOUNTER — Encounter: Payer: Self-pay | Admitting: Internal Medicine

## 2017-01-03 ENCOUNTER — Ambulatory Visit (INDEPENDENT_AMBULATORY_CARE_PROVIDER_SITE_OTHER): Payer: PPO | Admitting: Internal Medicine

## 2017-01-03 VITALS — BP 170/94 | HR 71 | Ht 68.0 in | Wt 182.0 lb

## 2017-01-03 DIAGNOSIS — I421 Obstructive hypertrophic cardiomyopathy: Secondary | ICD-10-CM

## 2017-01-03 NOTE — Progress Notes (Signed)
ELECTROPHYSIOLOGY CONSULT NOTE  Patient ID: Robert Chaney, MRN: [id number], DOB/AGE: December 11, 1950 66 y.o. Admit date: (Not on file) Date of Consult: 01/03/2017  Primary Physician: [name], MD Primary Cardiologist: KN  Reason for consultation: HCM    HPI Robert Chaney is a 66 y.o. male  Referred for Hypertrophic cardiomyopathy;  This occurs in the context of hypertension   MRI imaging though shows significant delayed enhancement consistent with scar supporting the diagnosis of HCM  He has long standing HTN.  He snores and has daytime somnolence.    Eval--Holter  VT beats x 4 ; CL 400   SVT non sustained   MRI  21 mm wall  FHx  Neg   No BP testing  No syncope/  He denies limitations in activity x with climbing hills   Family history evaluated. He had 5 siblings. The only 2 premature deaths of which he was aware of both childbirth. No history of syncope.  Past Medical History:  Diagnosis Date  . Anemia   . Diabetes mellitus   . Diastolic CHF, chronic (HCC)    A.  03/2009 Echo: EF 60-65%, Gr II diast dysfxn  . Glaucoma   . Hyperlipidemia    HYPERCHOLESTEROLEMIA  . Hypertension    MARKED LEFT VENTRICULAR HYPERTROPHY BY PREVIOUS ECHOCARDIOGRAM--HE HAS HYPERDYNAMIC LEFT VENTRICULAR SYSTOLIC FUNCTION AND HAS IMPAIRED RELAXATION BY ECHO  . Murmur    A.  Mild AI by 10/10 echo      Surgical History:  Past Surgical History:  Procedure Laterality Date  . NO PAST SURGERIES       Home Meds: Prior to Admission medications   Medication Sig Start Date End Date Taking? Authorizing Provider  Alcohol Swabs PADS 1 each by Does not apply route once. 07/24/11  Yes Dhungel, Nishant, MD  amLODipine (NORVASC) 10 MG tablet Take 1 tablet (10 mg total) by mouth at bedtime. 03/29/16  Yes Dorothy Spark, MD  atorvastatin (LIPITOR) 20 MG tablet TAKE ONE TABLET BY MOUTH ONCE DAILY 06/20/16  Yes Imogene Burn, PA-C  B-D ULTRAFINE III SHORT PEN 31G X 8 MM MISC Inject 10 Units as  directed 3 (three) times daily.  11/08/14  Yes [provider]  hydrALAZINE (APRESOLINE) 25 MG tablet Take 1 tablet (25 mg total) by mouth 3 (three) times daily. 03/29/16  Yes Dorothy Spark, MD  hydrochlorothiazide (MICROZIDE) 12.5 MG capsule Take 1 capsule (12.5 mg total) by mouth every other day. 03/29/16  Yes Dorothy Spark, MD  Insulin Glargine (TOUJEO SOLOSTAR) 300 UNIT/ML SOPN Inject 10 Units into the skin at bedtime.   Yes [provider]  Insulin Syringe-Needle U-100 (INSULIN SYRINGE 1CC/28G) 28G X 1/2" 1 ML MISC 1 each by Does not apply route once. 07/24/11  Yes Dhungel, Nishant, MD  isosorbide mononitrate (IMDUR) 60 MG 24 hr tablet Take 1 tablet (60 mg total) by mouth daily. 03/29/16  Yes Dorothy Spark, MD  metoprolol (LOPRESSOR) 100 MG tablet Take 1 tablet (100 mg total) by mouth 2 (two) times daily. 03/29/16  Yes Dorothy Spark, MD  nitroGLYCERIN (NITROSTAT) 0.4 MG SL tablet Place 1 tablet (0.4 mg total) under the tongue every 5 (five) minutes as needed for chest pain. 03/29/16  Yes Dorothy Spark, MD  Vitamin D, Ergocalciferol, (DRISDOL) 50000 units CAPS capsule Take 50,000 Units by mouth every 7 (seven) days.   Yes [provider]     Allergies:  Allergies  Allergen Reactions  . Aspirin  itching    Social History   Social History  . Marital status: Legally Separated    Spouse name: N/A  . Number of children: N/A  . Years of education: N/A   Occupational History  . 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 Smoking status: Never Smoker  . Smokeless tobacco: Never Used  . Alcohol use No  . Drug use: No  . Sexual activity: No   Other Topics Concern  . Not on file   Social History Narrative   Originally from [place].      Family History  Problem Relation Age of Onset  . Hypertension Father   . Diabetes Brother   . Diabetes Brother      ROS:  Please see the history of present  illness.     All other systems reviewed and negative.    Physical Exam:  Blood pressure (!) 170/94, pulse 71, height 5\' 8"  (1.727 m), weight 182 lb (82.6 kg), SpO2 96 %. General: Well developed, well nourished male in no acute distress. Head: Normocephalic, atraumatic, sclera non-icteric, no xanthomas, nares are without discharge. EENT: normal Lymph Nodes:  none Back: without scoliosis/kyphosis , no CVA tendersness Neck: Negative for carotid bruits. JVD not elevated. Lungs: Clear bilaterally to auscultation without wheezes, rales, or rhonchi. Breathing is unlabored. Heart: RRR with S1 W2.6*/3 systolic  murmur , rubs, or gallops appreciated. Abdomen: Soft, non-tender, non-distended with normoactive bowel sounds. No hepatomegaly. No rebound/guarding. No obvious abdominal masses. Msk:  Strength and tone appear normal for age. Extremities: No clubbing or cyanosis. No  edema.  Distal pedal pulses are 2+ and equal bilaterally. Skin: Warm and Dry Neuro: Alert and oriented X 3. CN III-XII intact Grossly normal sensory and motor function . Psych:  Responds to questions appropriately with a normal affect.      Labs: Cardiac Enzymes No results for input(s): CKTOTAL, CKMB, TROPONINI in the last 72 hours. CBC Lab Results  Component Value Date   WBC 5.7 11/07/2016   HGB 12.2 (L) 11/07/2016   HCT 35.9 (L) 11/07/2016   MCV 94 11/07/2016   PLT 233 11/07/2016   PROTIME: No results for input(s): LABPROT, INR in the last 72 hours. Chemistry No results for input(s): NA, K, CL, CO2, BUN, CREATININE, CALCIUM, PROT, BILITOT, ALKPHOS, ALT, AST, GLUCOSE in the last 168 hours.  Invalid input(s): LABALBU Lipids Lab Results  Component Value Date   CHOL 157 11/07/2016   HDL 59 11/07/2016   LDLCALC 82 11/07/2016   TRIG 81 11/07/2016   BNP Pro B Natriuretic peptide (BNP)  Date/Time Value Ref Range Status  07/20/2011 06:00 AM 1,493.0 (H) 0 - 125 pg/mL Final  07/19/2011 07:15 AM 1,915.0 (H) 0 - 125  pg/mL Final  04/18/2009 03:35 AM 242.0 (H) 0.0 - 100.0 pg/mL Final  04/16/2009 04:59 PM 510.0 (H) 0.0 - 100.0 pg/mL Final   Thyroid Function Tests: No results for input(s): TSH, T4TOTAL, T3FREE, THYROIDAB in the last 72 hours.  Invalid input(s): FREET3    Miscellaneous No results found for: DDIMER  Radiology/Studies:  No results found.  EKG: Sinus 57 17/09/42 LVH with TW inversions v4-6  MRI reviewed report is images Assessment and Plan:  Hypertrophic cardiomyopathy  Hypertension  Age 48, there are no risk stratification data which will be applied to him for primary prevention. Hence, clarifying his diagnosis relates to family risk stratification. There is no family history of unexplained death. We have discussed  evaluation of his family using echo/ECG versus genetic testing. He would prefer the latter we will arrange consultation with Dr. Broadus John.        Virl Axe

## 2017-01-03 NOTE — Patient Instructions (Signed)
Medication Instructions: - Your physician recommends that you continue on your current medications as directed. Please refer to the Current Medication list given to you today.  Labwork: - none ordered  Procedures/Testing: - none ordered  Follow-Up: - Dr. Caryl Comes will see you back on an as needed basis.   Any Additional Special Instructions Will Be Listed Below (If Applicable).  - We will call you to set up an appointment with Dr. Lattie Corns, genetic counselor [facility] to call).   If you need a refill on your cardiac medications before your next appointment, please call your pharmacy.

## 2017-01-14 ENCOUNTER — Telehealth: Payer: Self-pay | Admitting: Cardiovascular Disease

## 2017-01-14 NOTE — Telephone Encounter (Signed)
Robert Chaney is returning your call . Thanks

## 2017-01-22 DIAGNOSIS — E119 Type 2 diabetes mellitus without complications: Secondary | ICD-10-CM | POA: Diagnosis not present

## 2017-01-22 DIAGNOSIS — E784 Other hyperlipidemia: Secondary | ICD-10-CM | POA: Diagnosis not present

## 2017-01-22 DIAGNOSIS — I10 Essential (primary) hypertension: Secondary | ICD-10-CM | POA: Diagnosis not present

## 2017-01-22 DIAGNOSIS — I509 Heart failure, unspecified: Secondary | ICD-10-CM | POA: Diagnosis not present

## 2017-02-13 ENCOUNTER — Ambulatory Visit: Payer: PPO | Admitting: Genetic Counselor

## 2017-02-17 NOTE — Consult Note (Signed)
Robert Chaney was referred for genetic testing of hypertrophic cardiomyopathy. He was counseled on the genetics of hypertrophic cardiomyopathy (HCM), namely inheritance, incomplete penetrance, variable expression and digenic/compound mutations that can be seen in some patients with HCM. We also briefly discussed the infiltrative cardiomyopathies, their inheritance pattern and treatment plans. We walked through the process of genetic testing. The potential outcomes of genetic testing and subsequent management of at-risk family members were discussed so as to manage expectations. His medical and 4-generation family history was obtained. See details below  Robert Chaney (III.3 on pedigree) is a pleasant 66 year old gentleman who immigrated to [place] from [place] about 30 years ago. He states that he had no symptoms indicative of a heart condition when at age 54 he went for a regular doctor's visit. He reports that his doctor suspected that he had an underlying heart condition and was referred to a cardiologist. He was found to have HCM after several imaging studies were performed. He states that he has no major physical limitations other than the fact that he feels a little out of breath upon exertion. He does mentiont that he has hypertension since he was about 66 years old and is reportedly controlled by medication.  Family history Robert Chaney (III.3) has five siblings (III.1, III.2, III.4-III6). All his siblings except one are alive and in relatively good health. His elder sister (III.2) died at childbirth in her forties. Robert Chaney has two sons that died in [place] of natural causes at a very young age. His boys were 70 and 68 year old. He states that he does not know the exact nature of their death as he was in [place] at that time. There is no report of heart disease, HCM or sudden death in his family. He states that his relatives did not complain of health issues back in [place] and  hence he would not know if there were indeed an underlying heart issue. His parents (II.3-II4) and their siblings lived a mostly healthy and long life as far as he can recollect.   Impression and Plans  In summary, Robert Chaney does not exhibit the characteristic features of a genetic disease. His relatively late age of onset in the background of hypertension further confounds his diagnosis. Additionally, there is no family history of sudden death or HCM. It is likely that he may have a de novo mutation that would place his children at a 50% risk of inheriting his condition. However, he has no surviving children. Genetic testing for HCM may be considered to confirm his diagnosis, although the yield will be significantly reduced, at best 20%. I explained to him that the genetic test would not change his treatment or management strategies. He verbalized understanding of our discussion.  Robert Chaney, Ph.D, [facility]

## 2017-03-14 DIAGNOSIS — H401112 Primary open-angle glaucoma, right eye, moderate stage: Secondary | ICD-10-CM | POA: Diagnosis not present

## 2017-03-14 DIAGNOSIS — H401123 Primary open-angle glaucoma, left eye, severe stage: Secondary | ICD-10-CM | POA: Diagnosis not present

## 2017-03-14 DIAGNOSIS — E119 Type 2 diabetes mellitus without complications: Secondary | ICD-10-CM | POA: Diagnosis not present

## 2017-03-14 DIAGNOSIS — H2513 Age-related nuclear cataract, bilateral: Secondary | ICD-10-CM | POA: Diagnosis not present

## 2017-03-28 DIAGNOSIS — E119 Type 2 diabetes mellitus without complications: Secondary | ICD-10-CM | POA: Diagnosis not present

## 2017-03-28 DIAGNOSIS — H2513 Age-related nuclear cataract, bilateral: Secondary | ICD-10-CM | POA: Diagnosis not present

## 2017-03-28 DIAGNOSIS — H401123 Primary open-angle glaucoma, left eye, severe stage: Secondary | ICD-10-CM | POA: Diagnosis not present

## 2017-04-16 ENCOUNTER — Other Ambulatory Visit: Payer: Self-pay | Admitting: Cardiology

## 2017-04-16 DIAGNOSIS — I119 Hypertensive heart disease without heart failure: Secondary | ICD-10-CM

## 2017-04-16 DIAGNOSIS — I10 Essential (primary) hypertension: Secondary | ICD-10-CM

## 2017-04-16 DIAGNOSIS — E7800 Pure hypercholesterolemia, unspecified: Secondary | ICD-10-CM

## 2017-04-16 DIAGNOSIS — I5032 Chronic diastolic (congestive) heart failure: Secondary | ICD-10-CM

## 2017-04-17 DIAGNOSIS — H2512 Age-related nuclear cataract, left eye: Secondary | ICD-10-CM | POA: Diagnosis not present

## 2017-04-17 DIAGNOSIS — H401123 Primary open-angle glaucoma, left eye, severe stage: Secondary | ICD-10-CM | POA: Diagnosis not present

## 2017-04-19 ENCOUNTER — Other Ambulatory Visit: Payer: Self-pay | Admitting: Cardiology

## 2017-04-19 DIAGNOSIS — I119 Hypertensive heart disease without heart failure: Secondary | ICD-10-CM

## 2017-04-19 DIAGNOSIS — I5032 Chronic diastolic (congestive) heart failure: Secondary | ICD-10-CM

## 2017-04-19 DIAGNOSIS — I421 Obstructive hypertrophic cardiomyopathy: Secondary | ICD-10-CM

## 2017-04-19 DIAGNOSIS — I10 Essential (primary) hypertension: Secondary | ICD-10-CM

## 2017-04-19 DIAGNOSIS — E7800 Pure hypercholesterolemia, unspecified: Secondary | ICD-10-CM

## 2017-04-25 ENCOUNTER — Encounter: Payer: Self-pay | Admitting: Physician Assistant

## 2017-04-28 DIAGNOSIS — H401112 Primary open-angle glaucoma, right eye, moderate stage: Secondary | ICD-10-CM | POA: Diagnosis not present

## 2017-04-28 DIAGNOSIS — H2511 Age-related nuclear cataract, right eye: Secondary | ICD-10-CM | POA: Diagnosis not present

## 2017-05-07 ENCOUNTER — Encounter: Payer: Self-pay | Admitting: Physician Assistant

## 2017-05-07 NOTE — Progress Notes (Signed)
Cardiology Office Note    Date:  05/08/2017  ID:  Robert Chaney, DOB 19-Mar-1951, MRN [id number] PCP:  Robert Ebbs, MD  Cardiologist:  Dr. Meda Coffee, [place]   Chief Complaint: routine f/u cardiomyopathy  History of Present Illness:  Robert Chaney is a 66 y.o. male with history of hypertrophic cardiomyopathy, NSVT/PACS/PVCs/SVT, HTN, HLD, chronic diastolic CHF, DM, anemia who presents for f/u. Last echo 03/2016 showed severe LVH, EF 60-65%, somewhat speckled asymmetric septal hypetrophy, no RWMA, elevated LVEDP and LAFP, mod LAE. Cardiac MRI 10/2016 showed severe left ventricular hypertrophy with basal septal thickness of 21 mm and normal systolic function (LVEF = 62%), diffuse patchy late gadolinium enhancement in the basal anterior, anteroseptal, inferior, inferolateral, mid anterior, inferior, inferolateral and apical inferior walls, mild MR and TR, normal RV. Collectively, these findings are consistent with hypertrophic cardiomyopathy with high risk feature - presence of LGE in the 8 myocardial segments. 48-hour Holter 11/2016 showed SB to SR, infrequent PACs/PVCS, 1 run NSVT (4 beats), frequent short runs of SVT posisbly atrial tachycardia.  All his siblings except one are alive and in relatively good health. His elder sister (III.2) died at childbirth in her forties. Robert Chaney has two sons that died in [place] of natural causes at a very young age. His boys were 57 and 68 year old. He states that he does not know the exact nature of their death as he was in [place] at that time. There is no report of heart disease, HCM or sudden death in his family. He states that his relatives did not complain of health issues back in [place] and hence he would not know if there were indeed an underlying heart issue. His parents and their siblings lived a mostly healthy and long life as far as he can recollect. He was referred to Dr. Caryl Comes for consideration of ICD who felt that "at age 65, there are no risk  stratification data which will be applied to him for primary prevention. Hence, clarifying his diagnosis relates to family risk stratification. There is no family history of unexplained death. We have discussed evaluation of his family using echo/ECG versus genetic testing. He would prefer the latter we will arrange consultation with Dr. Broadus John." Per Dr. Delene Loll recommendations, "In summary, [name] does not exhibit the characteristic features of a genetic disease. His relatively late age of onset in the background of hypertension further confounds his diagnosis. Additionally, there is no family history of sudden death or HCM. It is likely that he may have a de novo mutation that would place his children at a 50% risk of inheriting his condition. However, he has no surviving children. Genetic testing for HCM may be considered to confirm his diagnosis, although the yield will be significantly reduced, at best 20%. I explained to him that the genetic test would not change his treatment or management strategies."  He returns for follow-up feeling well. He plays tennis regularly without any functional limitation. No CP, SOB, diaphoresis, dizziness or syncope. No palpitations. Reports he periodically follows BP at home and at work with readings in the 542-706 systolic range. He states he forgot to take his medication last night and this morning.    Past Medical History:  Diagnosis Date  . Anemia   . Diabetes mellitus   . Diastolic CHF, chronic (HCC)    A.  03/2009 Echo: EF 60-65%, Gr II diast dysfxn  . Glaucoma   . Hyperlipidemia    HYPERCHOLESTEROLEMIA  .  Hypertension    MARKED LEFT VENTRICULAR HYPERTROPHY BY PREVIOUS ECHOCARDIOGRAM--HE HAS HYPERDYNAMIC LEFT VENTRICULAR SYSTOLIC FUNCTION AND HAS IMPAIRED RELAXATION BY ECHO  . Hypertrophic cardiomyopathy ([place])   . Murmur    A.  Mild AI by 10/10 echo  . NSVT (nonsustained ventricular tachycardia) ([place])   . Premature atrial contractions   . PVC's  (premature ventricular contractions)   . SVT (supraventricular tachycardia) (HCC)     Past Surgical History:  Procedure Laterality Date  . NO PAST SURGERIES      Current Medications: Current Meds  Medication Sig  . Alcohol Swabs PADS 1 each by Does not apply route once.  Marland Kitchen amLODipine (NORVASC) 10 MG tablet TAKE ONE TABLET BY MOUTH AT BEDTIME  . atorvastatin (LIPITOR) 20 MG tablet TAKE ONE TABLET BY MOUTH ONCE DAILY  . B-D ULTRAFINE III SHORT PEN 31G X 8 MM MISC Inject 10 Units as directed 3 (three) times daily.   . hydrALAZINE (APRESOLINE) 25 MG tablet Take 1 tablet (25 mg total) by mouth 3 (three) times daily.  . hydrochlorothiazide (MICROZIDE) 12.5 MG capsule TAKE ONE CAPSULE BY MOUTH EVERY OTHER DAY  . Insulin Glargine (TOUJEO SOLOSTAR) 300 UNIT/ML SOPN Inject 10 Units into the skin at bedtime.  . Insulin Syringe-Needle U-100 (INSULIN SYRINGE 1CC/28G) 28G X 1/2" 1 ML MISC 1 each by Does not apply route once.  . isosorbide mononitrate (IMDUR) 60 MG 24 hr tablet Take 1 tablet (60 mg total) by mouth daily.  . metoprolol (LOPRESSOR) 100 MG tablet Take 1 tablet (100 mg total) by mouth 2 (two) times daily.  . nitroGLYCERIN (NITROSTAT) 0.4 MG SL tablet Place 1 tablet (0.4 mg total) under the tongue every 5 (five) minutes as needed for chest pain.  . Vitamin D, Ergocalciferol, (DRISDOL) 50000 units CAPS capsule Take 50,000 Units by mouth every 7 (seven) days.     Allergies:   Aspirin   Social History   Socioeconomic History  . Marital status: Legally Separated    Spouse name: None  . Number of children: None  . Years of education: None  . Highest education level: None  Social Needs  . Financial resource strain: None  . Food insecurity - worry: None  . Food insecurity - inability: None  . Transportation needs - medical: None  . Transportation needs - non-medical: None  Occupational History  . Occupation: Forensic psychologist: [employer]    Comment: At [employer]  . Smoking status: Never Smoker  . Smokeless tobacco: Never Used  Substance and Sexual Activity  . Alcohol use: No  . Drug use: No  . Sexual activity: No  Other Topics Concern  . None  Social History Narrative   Originally from [place].      Family History:  Family History  Problem Relation Age of Onset  . Hypertension Father   . Diabetes Brother   . Diabetes Brother      ROS:   Please see the history of present illness.  All other systems are reviewed and otherwise negative.    PHYSICAL EXAM:   VS:  BP (!) 174/88   Pulse 66   Resp 16   Ht 5\' 8"  (1.727 m)   Wt 184 lb 12.8 oz (83.8 kg)   SpO2 97%   BMI 28.10 kg/m   BMI: Body mass index is 28.1 kg/m. GEN: Well nourished, well developed AAM, in no acute distress  HEENT: normocephalic, atraumatic Neck: no JVD, carotid  bruits, or masses Cardiac: RRR; soft SEM. No rubs or gallops, no edema  Respiratory:  clear to auscultation bilaterally, normal work of breathing GI: soft, nontender, nondistended, + BS MS: no deformity or atrophy  Skin: warm and dry, no rash Neuro:  Alert and Oriented x 3, Strength and sensation are intact, follows commands Psych: euthymic mood, full affect  Wt Readings from Last 3 Encounters:  05/08/17 184 lb 12.8 oz (83.8 kg)  01/03/17 182 lb (82.6 kg)  11/07/16 182 lb (82.6 kg)      Studies/Labs Reviewed:   EKG:  EKG was ordered today and personally reviewed by me and demonstrates NSR 62bpm, LVH with repolarization abnormalities.  Recent Labs: 11/07/2016: ALT 11; BUN 15; Creatinine, Ser 1.45; Hemoglobin 12.2; Platelets 233; Potassium 4.6; Sodium 138; TSH 3.230   Lipid Panel    Component Value Date/Time   CHOL 157 11/07/2016 0922   TRIG 81 11/07/2016 0922   HDL 59 11/07/2016 0922   CHOLHDL 2.7 11/07/2016 0922   CHOLHDL 3 05/04/2014 0852   VLDL 13.6 05/04/2014 0852   LDLCALC 82 11/07/2016 0922    Additional studies/ records that were reviewed today  include: Summarized above.    ASSESSMENT & PLAN:   1. Hypertrophic cardiomyopathy - as above, not felt to require defibrillator or genetic testing. Mainstay of therapy will be focusing on blood pressure management as I suspect uncontrolled BP has contributed significantly to development of LVH over time. Discussed importance of blood pressure control. Also discussed avoiding dehydration but not going overboard over 2L per day . 2. HTN - repeat BP 170/82 by me. He reports he missed his BP meds last night and this morning. Compliance reinforced. He reports home blood pressures controlled in the 782-956 systolic range. I am a little concerned at the fact that all 3 of his prior office blood pressures have been poorly controlled. It is difficult to assess his control today if his meds are not on board. I advised him to please take everything as prescribed and f/u in 1 week with APP visit to recheck blood pressure. At that visit, consideration could be given to changing metoprolol to carvedilol and considering ACEI/ARB initiation. He does have CKD by labs, probably stage III, which may also be a sequelae of poor blood pressure control. Although he is on amlodipine which is less ideal with patients who have defined HOCM, he does not have any prior evidence of obstruction on echocardiogram so this will be continued. He also reports significant snoring so will arrange sleep study to exclude OSA. If blood pressure remains significantly elevated at next OV despite meds on board, could consider renal duplex as well as urinalysis to evaluate for RAS and proteinuria/nephrotic disease. 3. Chronic diastolic CHF - appears euvolemic. Reviewed 2g sodium restriction, 2L fluid restriction (but avoiding dehydration) with patient. 4. NSVT/SVT - no known family history of sudden cardiac death. No history of syncope. Per Dr. Caryl Comes, he did not feel there is a current indication for ICD. Continue to monitor. Warning sx reviewed  with patient.Prior lytes and TSH were normal.  Disposition: F/u with APP in 1 week for repeat BP check.   Medication Adjustments/Labs and Tests Ordered: Current medicines are reviewed at length with the patient today.  Concerns regarding medicines are outlined above. Medication changes, Labs and Tests ordered today are summarized above and listed in the Patient Instructions accessible in Encounters.   Signed, Charlie Pitter, PA-C  05/08/2017 10:20 AM    [facility] [place], [place], [place]  [address] Phone: [phone]; Fax: [phone]

## 2017-05-08 ENCOUNTER — Encounter: Payer: Self-pay | Admitting: Physician Assistant

## 2017-05-08 ENCOUNTER — Ambulatory Visit: Payer: PPO | Admitting: Physician Assistant

## 2017-05-08 ENCOUNTER — Encounter (INDEPENDENT_AMBULATORY_CARE_PROVIDER_SITE_OTHER): Payer: Self-pay

## 2017-05-08 VITALS — BP 170/82 | HR 66 | Resp 16 | Ht 68.0 in | Wt 184.8 lb

## 2017-05-08 DIAGNOSIS — I4729 Other ventricular tachycardia: Secondary | ICD-10-CM

## 2017-05-08 DIAGNOSIS — I10 Essential (primary) hypertension: Secondary | ICD-10-CM

## 2017-05-08 DIAGNOSIS — I5032 Chronic diastolic (congestive) heart failure: Secondary | ICD-10-CM

## 2017-05-08 DIAGNOSIS — I422 Other hypertrophic cardiomyopathy: Secondary | ICD-10-CM

## 2017-05-08 DIAGNOSIS — I471 Supraventricular tachycardia: Secondary | ICD-10-CM | POA: Diagnosis not present

## 2017-05-08 DIAGNOSIS — I472 Ventricular tachycardia: Secondary | ICD-10-CM

## 2017-05-08 DIAGNOSIS — R0683 Snoring: Secondary | ICD-10-CM

## 2017-05-08 NOTE — Patient Instructions (Signed)
Medication Instructions: Your physician recommends that you continue on your current medications as directed. Please refer to the Current Medication list given to you today.  Labwork: None Ordered  Procedures/Testing: Your physician has recommended that you have a sleep study. This test records several body functions during sleep, including: brain activity, eye movement, oxygen and carbon dioxide blood levels, heart rate and rhythm, breathing rate and rhythm, the flow of air through your mouth and nose, snoring, body muscle movements, and chest and belly movement.   Follow-Up: Your physician recommends that you schedule a follow-up appointment in: 1 WEEK with an APP on Dr. Thera Flake team   Your physician wants you to follow-up in: 6 MONTHS with Dr. Meda Coffee. You will receive a reminder letter in the mail two months in advance. If you don't receive a letter, please call our office to schedule the follow-up appointment.   If you need a refill on your cardiac medications before your next appointment, please call your pharmacy.

## 2017-05-09 ENCOUNTER — Telehealth: Payer: Self-pay | Admitting: *Deleted

## 2017-05-09 NOTE — Telephone Encounter (Addendum)
-----   Message from Bobby Rumpf, [place] sent at 05/08/2017 10:28 AM EST ----- Regarding: sleep study Per Dayna Dunn pt to hav a sleep study Orders are in DX: HTN and Snoring ESS=13

## 2017-05-12 DIAGNOSIS — I10 Essential (primary) hypertension: Secondary | ICD-10-CM | POA: Diagnosis not present

## 2017-05-12 DIAGNOSIS — E1165 Type 2 diabetes mellitus with hyperglycemia: Secondary | ICD-10-CM | POA: Diagnosis not present

## 2017-05-12 DIAGNOSIS — N183 Chronic kidney disease, stage 3 (moderate): Secondary | ICD-10-CM | POA: Diagnosis not present

## 2017-05-12 DIAGNOSIS — I509 Heart failure, unspecified: Secondary | ICD-10-CM | POA: Diagnosis not present

## 2017-05-12 DIAGNOSIS — Z23 Encounter for immunization: Secondary | ICD-10-CM | POA: Diagnosis not present

## 2017-05-13 ENCOUNTER — Ambulatory Visit: Payer: PPO | Admitting: Cardiology

## 2017-05-13 ENCOUNTER — Encounter: Payer: Self-pay | Admitting: Cardiology

## 2017-05-13 VITALS — BP 160/86 | HR 84 | Ht 68.0 in | Wt 184.0 lb

## 2017-05-13 DIAGNOSIS — I10 Essential (primary) hypertension: Secondary | ICD-10-CM | POA: Diagnosis not present

## 2017-05-13 MED ORDER — CARVEDILOL 25 MG PO TABS: 25.0000 mg | ORAL_TABLET | Freq: Two times a day (BID) | ORAL | 3 refills | Status: DC

## 2017-05-13 NOTE — Progress Notes (Signed)
05/13/2017 Robert Chaney   [birth year]  [id number]  Primary Physician Robert Ebbs, MD Primary Cardiologist: Dr. Meda Chaney  Electrophysiologist: Dr. Caryl Chaney   Reason for Visit/CC: 1 week f/u for HTN; Medication Management  HPI:  66 y.o. male with history of hypertrophic cardiomyopathy, NSVT/PACS/PVCs/SVT, HTN, HLD, chronic diastolic CHF, DM, anemia who presents for f/u. Last echo 03/2016 showed severe LVH, EF 60-65%, somewhat speckled asymmetric septal hypetrophy, no RWMA, elevated LVEDP and LAFP, mod LAE. Cardiac MRI 10/2016 showed severe left ventricular hypertrophy with basal septal thickness of 21 mm and normal systolic function (LVEF = 62%), diffuse patchy late gadolinium enhancement in the basal anterior, anteroseptal, inferior, inferolateral, mid anterior, inferior, inferolateral and apical inferior walls, mild MR and TR, normal RV. Collectively, these findings are consistent with hypertrophic cardiomyopathy.  He is followed by Dr. Meda Chaney and Dr. Caryl Chaney. Given no family h/o HOCM or SCD and fact that pt does not exhibit the characteristic features of a genetic disease, Dr. Caryl Chaney feels that there is no indication for ICD. Recommendations are for medical management of his HTN, as it is suspected that uncontrolled HTN has contributed to significant development of LVH.   Pt was seen by Robert Copa, PA-C, on 05/08/17 and was noted to be hypertensive. BP was 174/88. She reviewed prior BP readings and noted persistently poorly controlled BP.  Pt admitted to a few missed doses of his meds. He did not take his meds the night prior and the morning of his appt. The importance of strict med compliance was discussed and pt was instructed to take meds as directed and to f/u in 1 week for repeat BP check. It was outlined in note to consider changing BB from metoprolol to Coreg, if BP was still poorly controlled even with full compliance. Consideration of ACE/ARB therapy was also noted to be considered. It was also  recommended that the patient undergo a sleep study to r/o OSA, which is pending.   Today in clinic, Pt notes that he feels well. No cardiac symptoms. Office BP is 160/86. He has been fully compliant with all meds. He took his meds this morning. He also brings a list of home BP readings, which have been controlled based on his monitor, with SBPs in the 130s. Pt notes BP is always elevated when he goes to Dr's offices. ? True [place]at HTN, vs inaccurate home monitor.     Current Meds  Medication Sig  . Alcohol Swabs PADS 1 each by Does not apply route once.  Marland Kitchen amLODipine (NORVASC) 10 MG tablet TAKE ONE TABLET BY MOUTH AT BEDTIME  . atorvastatin (LIPITOR) 20 MG tablet TAKE ONE TABLET BY MOUTH ONCE DAILY  . B-D ULTRAFINE III SHORT PEN 31G X 8 MM MISC Inject 10 Units as directed 3 (three) times daily.   . hydrALAZINE (APRESOLINE) 25 MG tablet Take 1 tablet (25 mg total) by mouth 3 (three) times daily.  . hydrochlorothiazide (MICROZIDE) 12.5 MG capsule TAKE ONE CAPSULE BY MOUTH EVERY OTHER DAY  . Insulin Glargine (TOUJEO SOLOSTAR) 300 UNIT/ML SOPN Inject 10 Units into the skin at bedtime.  . Insulin Syringe-Needle U-100 (INSULIN SYRINGE 1CC/28G) 28G X 1/2" 1 ML MISC 1 each by Does not apply route once.  . isosorbide mononitrate (IMDUR) 60 MG 24 hr tablet Take 1 tablet (60 mg total) by mouth daily.  . metoprolol (LOPRESSOR) 100 MG tablet Take 1 tablet (100 mg total) by mouth 2 (two) times daily.  . nitroGLYCERIN (NITROSTAT) 0.4 MG SL tablet  Place 1 tablet (0.4 mg total) under the tongue every 5 (five) minutes as needed for chest pain.  . Vitamin D, Ergocalciferol, (DRISDOL) 50000 units CAPS capsule Take 50,000 Units by mouth every 7 (seven) days.   Allergies  Allergen Reactions  . Aspirin     itching   Past Medical History:  Diagnosis Date  . Anemia   . Diabetes mellitus   . Diastolic CHF, chronic (HCC)    A.  03/2009 Echo: EF 60-65%, Gr II diast dysfxn  . Glaucoma   . Hyperlipidemia     HYPERCHOLESTEROLEMIA  . Hypertension    MARKED LEFT VENTRICULAR HYPERTROPHY BY PREVIOUS ECHOCARDIOGRAM--HE HAS HYPERDYNAMIC LEFT VENTRICULAR SYSTOLIC FUNCTION AND HAS IMPAIRED RELAXATION BY ECHO  . Hypertrophic cardiomyopathy ([place])   . Murmur    A.  Mild AI by 10/10 echo  . NSVT (nonsustained ventricular tachycardia) ([place])   . Premature atrial contractions   . PVC's (premature ventricular contractions)   . SVT (supraventricular tachycardia) (HCC)    Family History  Problem Relation Age of Onset  . Hypertension Father   . Diabetes Brother   . Diabetes Brother    Past Surgical History:  Procedure Laterality Date  . NO PAST SURGERIES     Social History   Socioeconomic History  . Marital status: Legally Separated    Spouse name: Not on file  . Number of children: Not on file  . Years of education: Not on file  . Highest education level: Not on file  Social Needs  . Financial resource strain: Not on file  . Food insecurity - worry: Not on file  . Food insecurity - inability: Not on file  . Transportation needs - medical: Not on file  . Transportation needs - non-medical: Not on file  Occupational History  . Occupation: Forensic psychologist: [employer]    Comment: At [employer]  . Smoking status: Never Smoker  . Smokeless tobacco: Never Used  Substance and Sexual Activity  . Alcohol use: No  . Drug use: No  . Sexual activity: No  Other Topics Concern  . Not on file  Social History Narrative   Originally from [place].      Review of Systems: General: negative for chills, fever, night sweats or weight changes.  Cardiovascular: negative for chest pain, dyspnea on exertion, edema, orthopnea, palpitations, paroxysmal nocturnal dyspnea or shortness of breath Dermatological: negative for rash Respiratory: negative for cough or wheezing Urologic: negative for hematuria Abdominal: negative for nausea, vomiting, diarrhea, bright red blood per rectum,  melena, or hematemesis Neurologic: negative for visual changes, syncope, or dizziness All other systems reviewed and are otherwise negative except as noted above.   Physical Exam:  Blood pressure (!) 160/86, pulse 84, height 5\' 8"  (1.727 m), weight 184 lb (83.5 kg).  General appearance: alert, cooperative and no distress Neck: no carotid bruit and no JVD Lungs: clear to auscultation bilaterally Heart: regular rate and rhythm, S1, S2 normal, no murmur, click, rub or gallop Extremities: extremities normal, atraumatic, no cyanosis or edema Pulses: 2+ and symmetric Skin: Skin color, texture, turgor normal. No rashes or lesions Neurologic: Grossly normal  EKG not performed -- personally reviewed   ASSESSMENT AND PLAN:   1. HTN/ Hypertensive Heart Disease: pt has significant LVH, which is felt to be 2/2 poorly controlled HTN. Pt back today for repeat BP check. Office BPs historically have shown poor control. Pt reports full med compliance, however BP today remains  elevated at 160/86. Home readings however have been in the 670L systolic. Pt notes BP is always elevated when he goes to Dr's offices. ? True [place] HTN vs inaccurate home monitor. We will plan to change his BB from metoprolol to Coreg, 25 mg BID, and will set up with a 24 hr ambulatory BP monitor for more accurate assessment of BP. F/u in 2 weeks to review monitor results and response to med change.   2. Hypertrophic Cardiomyopathy: followed by Dr. Caryl Chaney. Given no family h/o HOCM or SCD and fact that pt does not exhibit the characteristic features of a genetic disease, Dr. Caryl Chaney feels that there is no indication for ICD. Recommendations are for medical management of his HTN, as it is suspected that uncontrolled HTN has contributed to significant development of LVH. See problem # 1.   Follow-Up in 2 weeks to recheck BP and review results of 24 hr ambulatory BP monitor.   Brittainy Ladoris Gene, MHS [facility] 05/13/2017 9:08  AM

## 2017-05-13 NOTE — Patient Instructions (Addendum)
Medication Instructions:   STOP TAKING METOPROLOL   START TAKING CARVEDILOL 25 MG TWICE DAY   If you need a refill on your cardiac medications before your next appointment, please call your pharmacy.  Labwork: NONE ORDERED  TODAY'   Testing/Procedures:  Your physician has recommended that you wear a holter monitor. Holter monitors are medical devices that record the heart's electrical activity. Doctors most often use these monitors to diagnose arrhythmias. Arrhythmias are problems with the speed or rhythm of the heartbeat. The monitor is a small, portable device. You can wear one while you do your normal daily activities. This is usually used to diagnose what is causing palpitations/syncope (passing out).     Follow-Up:  IN 2 TO 3 WEEKS WITH AN APP    Any Other Special Instructions Will Be Listed Below (If Applicable).

## 2017-05-13 NOTE — Progress Notes (Signed)
160/86 

## 2017-05-23 ENCOUNTER — Ambulatory Visit (INDEPENDENT_AMBULATORY_CARE_PROVIDER_SITE_OTHER): Payer: PPO

## 2017-05-23 DIAGNOSIS — I10 Essential (primary) hypertension: Secondary | ICD-10-CM | POA: Diagnosis not present

## 2017-06-02 ENCOUNTER — Ambulatory Visit: Payer: PPO | Admitting: Physician Assistant

## 2017-06-05 ENCOUNTER — Telehealth: Payer: Self-pay | Admitting: *Deleted

## 2017-06-05 DIAGNOSIS — E11311 Type 2 diabetes mellitus with unspecified diabetic retinopathy with macular edema: Secondary | ICD-10-CM | POA: Diagnosis not present

## 2017-06-05 NOTE — Telephone Encounter (Signed)
Attempted to call the pt back and no answer.

## 2017-06-05 NOTE — Telephone Encounter (Signed)
-----   Message from Dorothy Spark, MD sent at 05/30/2017  3:20 PM EST ----- Based on BP monitor I would increase hydralazine to 50 mg po TID.

## 2017-06-05 NOTE — Telephone Encounter (Signed)
Left a message for the pt to call to endorse BP monitor results and recommendations per Dr Meda Coffee.

## 2017-06-06 MED ORDER — HYDRALAZINE HCL 50 MG PO TABS: 50.0000 mg | ORAL_TABLET | Freq: Three times a day (TID) | ORAL | 2 refills | Status: DC

## 2017-06-06 NOTE — Telephone Encounter (Signed)
Spoke with the pt and informed him that per Dr Meda Coffee, based on his BP monitor results, she recommends that we increase his hydralazine to 50 mg po TID.  Confirmed the pharmacy of choice with the pt.  Pt verbalized understanding and agrees with this plan.

## 2017-06-08 ENCOUNTER — Other Ambulatory Visit: Payer: Self-pay

## 2017-06-08 ENCOUNTER — Inpatient Hospital Stay (HOSPITAL_COMMUNITY)
Admission: EM | Admit: 2017-06-08 | Discharge: 2017-06-10 | DRG: 683 | Disposition: A | Discharge status: Home / Self Care | Payer: PPO | Attending: Internal Medicine | Admitting: Internal Medicine

## 2017-06-08 ENCOUNTER — Emergency Department (HOSPITAL_COMMUNITY): Payer: PPO

## 2017-06-08 DIAGNOSIS — R7989 Other specified abnormal findings of blood chemistry: Secondary | ICD-10-CM | POA: Diagnosis present

## 2017-06-08 DIAGNOSIS — Z833 Family history of diabetes mellitus: Secondary | ICD-10-CM

## 2017-06-08 DIAGNOSIS — Z794 Long term (current) use of insulin: Secondary | ICD-10-CM

## 2017-06-08 DIAGNOSIS — E860 Dehydration: Secondary | ICD-10-CM | POA: Diagnosis present

## 2017-06-08 DIAGNOSIS — R4182 Altered mental status, unspecified: Secondary | ICD-10-CM | POA: Diagnosis not present

## 2017-06-08 DIAGNOSIS — E869 Volume depletion, unspecified: Secondary | ICD-10-CM | POA: Diagnosis not present

## 2017-06-08 DIAGNOSIS — N179 Acute kidney failure, unspecified: Principal | ICD-10-CM | POA: Diagnosis present

## 2017-06-08 DIAGNOSIS — Z79899 Other long term (current) drug therapy: Secondary | ICD-10-CM | POA: Diagnosis not present

## 2017-06-08 DIAGNOSIS — I130 Hypertensive heart and chronic kidney disease with heart failure and stage 1 through stage 4 chronic kidney disease, or unspecified chronic kidney disease: Secondary | ICD-10-CM | POA: Diagnosis present

## 2017-06-08 DIAGNOSIS — I5032 Chronic diastolic (congestive) heart failure: Secondary | ICD-10-CM | POA: Diagnosis present

## 2017-06-08 DIAGNOSIS — N184 Chronic kidney disease, stage 4 (severe): Secondary | ICD-10-CM | POA: Diagnosis present

## 2017-06-08 DIAGNOSIS — E1165 Type 2 diabetes mellitus with hyperglycemia: Secondary | ICD-10-CM | POA: Diagnosis present

## 2017-06-08 DIAGNOSIS — Z8249 Family history of ischemic heart disease and other diseases of the circulatory system: Secondary | ICD-10-CM

## 2017-06-08 DIAGNOSIS — I10 Essential (primary) hypertension: Secondary | ICD-10-CM | POA: Diagnosis present

## 2017-06-08 DIAGNOSIS — I491 Atrial premature depolarization: Secondary | ICD-10-CM | POA: Diagnosis not present

## 2017-06-08 DIAGNOSIS — I422 Other hypertrophic cardiomyopathy: Secondary | ICD-10-CM | POA: Diagnosis present

## 2017-06-08 DIAGNOSIS — N183 Chronic kidney disease, stage 3 (moderate): Secondary | ICD-10-CM

## 2017-06-08 DIAGNOSIS — R748 Abnormal levels of other serum enzymes: Secondary | ICD-10-CM | POA: Diagnosis not present

## 2017-06-08 DIAGNOSIS — E785 Hyperlipidemia, unspecified: Secondary | ICD-10-CM | POA: Diagnosis not present

## 2017-06-08 DIAGNOSIS — H409 Unspecified glaucoma: Secondary | ICD-10-CM | POA: Diagnosis not present

## 2017-06-08 DIAGNOSIS — R402441 Other coma, without documented Glasgow coma scale score, or with partial score reported, in the field [EMT or ambulance]: Secondary | ICD-10-CM | POA: Diagnosis not present

## 2017-06-08 DIAGNOSIS — I119 Hypertensive heart disease without heart failure: Secondary | ICD-10-CM | POA: Diagnosis present

## 2017-06-08 DIAGNOSIS — I472 Ventricular tachycardia: Secondary | ICD-10-CM | POA: Diagnosis not present

## 2017-06-08 DIAGNOSIS — E118 Type 2 diabetes mellitus with unspecified complications: Secondary | ICD-10-CM | POA: Diagnosis not present

## 2017-06-08 DIAGNOSIS — E1122 Type 2 diabetes mellitus with diabetic chronic kidney disease: Secondary | ICD-10-CM | POA: Diagnosis not present

## 2017-06-08 DIAGNOSIS — I493 Ventricular premature depolarization: Secondary | ICD-10-CM | POA: Diagnosis present

## 2017-06-08 DIAGNOSIS — R778 Other specified abnormalities of plasma proteins: Secondary | ICD-10-CM | POA: Diagnosis present

## 2017-06-08 DIAGNOSIS — N189 Chronic kidney disease, unspecified: Secondary | ICD-10-CM

## 2017-06-08 DIAGNOSIS — I471 Supraventricular tachycardia: Secondary | ICD-10-CM | POA: Diagnosis present

## 2017-06-08 DIAGNOSIS — Z886 Allergy status to analgesic agent status: Secondary | ICD-10-CM

## 2017-06-08 HISTORY — DX: Other specified abnormalities of plasma proteins: R77.8

## 2017-06-08 HISTORY — DX: Other specified abnormal findings of blood chemistry: R79.89

## 2017-06-08 LAB — CBC
HCT: 33.8 % — ABNORMAL LOW (ref 39.0–52.0)
HEMOGLOBIN: 11.4 g/dL — AB (ref 13.0–17.0)
MCH: 32.3 pg (ref 26.0–34.0)
MCHC: 33.7 g/dL (ref 30.0–36.0)
MCV: 95.8 fL (ref 78.0–100.0)
PLATELETS: 170 10*3/uL (ref 150–400)
RBC: 3.53 MIL/uL — AB (ref 4.22–5.81)
RDW: 12.6 % (ref 11.5–15.5)
WBC: 6.4 10*3/uL (ref 4.0–10.5)

## 2017-06-08 LAB — URINALYSIS, ROUTINE W REFLEX MICROSCOPIC
BILIRUBIN URINE: NEGATIVE
Glucose, UA: >=500 mg/dL — AB
Ketones, ur: NEGATIVE mg/dL
NITRITE: NEGATIVE
PH: 5 (ref 5.0–8.0)
Protein, ur: 100 mg/dL — AB
SPECIFIC GRAVITY, URINE: 1.017 (ref 1.005–1.030)

## 2017-06-08 LAB — COMPREHENSIVE METABOLIC PANEL
ALBUMIN: 3.3 g/dL — AB (ref 3.5–5.0)
ALK PHOS: 100 U/L (ref 38–126)
ALT: 18 U/L (ref 17–63)
ANION GAP: 9 (ref 5–15)
AST: 21 U/L (ref 15–41)
BILIRUBIN TOTAL: 0.6 mg/dL (ref 0.3–1.2)
BUN: 41 mg/dL — AB (ref 6–20)
CALCIUM: 9.6 mg/dL (ref 8.9–10.3)
CO2: 27 mmol/L (ref 22–32)
Chloride: 102 mmol/L (ref 101–111)
Creatinine, Ser: 3.07 mg/dL — ABNORMAL HIGH (ref 0.61–1.24)
GFR calc Af Amer: 22 mL/min — ABNORMAL LOW (ref >60)
GFR calc non Af Amer: 19 mL/min — ABNORMAL LOW (ref >60)
GLUCOSE: 333 mg/dL — AB (ref 65–99)
Potassium: 3.3 mmol/L — ABNORMAL LOW (ref 3.5–5.1)
SODIUM: 138 mmol/L (ref 135–145)
TOTAL PROTEIN: 7.1 g/dL (ref 6.5–8.1)

## 2017-06-08 LAB — DIFFERENTIAL
BASOS ABS: 0 10*3/uL (ref 0.0–0.1)
Basophils Relative: 0 %
Eosinophils Absolute: 0 10*3/uL (ref 0.0–0.7)
Eosinophils Relative: 1 %
LYMPHS ABS: 1.2 10*3/uL (ref 0.7–4.0)
LYMPHS PCT: 19 %
Monocytes Absolute: 0.5 10*3/uL (ref 0.1–1.0)
Monocytes Relative: 7 %
NEUTROS ABS: 4.7 10*3/uL (ref 1.7–7.7)
NEUTROS PCT: 73 %

## 2017-06-08 LAB — PROTIME-INR
INR: 1.05
PROTHROMBIN TIME: 13.6 s (ref 11.4–15.2)

## 2017-06-08 LAB — I-STAT TROPONIN, ED: Troponin i, poc: 0.1 ng/mL (ref 0.00–0.08)

## 2017-06-08 LAB — GLUCOSE, CAPILLARY
GLUCOSE-CAPILLARY: 462 mg/dL — AB (ref 65–99)
GLUCOSE-CAPILLARY: 384 mg/dL — AB (ref 65–99)
GLUCOSE-CAPILLARY: 175 mg/dL — AB (ref 65–99)
Glucose-Capillary: 230 mg/dL — ABNORMAL HIGH (ref 65–99)

## 2017-06-08 LAB — RAPID URINE DRUG SCREEN, HOSP PERFORMED
AMPHETAMINES: NOT DETECTED
BENZODIAZEPINES: NOT DETECTED
Barbiturates: NOT DETECTED
Cocaine: NOT DETECTED
OPIATES: NOT DETECTED
TETRAHYDROCANNABINOL: NOT DETECTED

## 2017-06-08 LAB — TROPONIN I
Troponin I: 0.24 ng/mL (ref <0.03)
Troponin I: 0.32 ng/mL (ref <0.03)
Troponin I: 0.31 ng/mL (ref <0.03)

## 2017-06-08 LAB — APTT: APTT: 24 s (ref 24–36)

## 2017-06-08 LAB — BRAIN NATRIURETIC PEPTIDE: B Natriuretic Peptide: 223 pg/mL — ABNORMAL HIGH (ref 0.0–100.0)

## 2017-06-08 MED ORDER — AMLODIPINE BESYLATE 5 MG PO TABS
10.0000 mg | ORAL_TABLET | Freq: Every day | ORAL | Status: DC
  Administered 2017-06-08 – 2017-06-09 (×2): 10 mg via ORAL
  Filled 2017-06-08 (×2): qty 2

## 2017-06-08 MED ORDER — CARVEDILOL 12.5 MG PO TABS
25.0000 mg | ORAL_TABLET | Freq: Two times a day (BID) | ORAL | Status: DC
  Administered 2017-06-08 – 2017-06-10 (×5): 25 mg via ORAL
  Filled 2017-06-08 (×5): qty 2

## 2017-06-08 MED ORDER — ACETAMINOPHEN 650 MG RE SUPP: 650.0000 mg | Freq: Four times a day (QID) | RECTAL | Status: DC | PRN

## 2017-06-08 MED ORDER — HYDRALAZINE HCL 25 MG PO TABS
50.0000 mg | ORAL_TABLET | Freq: Three times a day (TID) | ORAL | Status: DC
  Administered 2017-06-08 – 2017-06-10 (×7): 50 mg via ORAL
  Filled 2017-06-08 (×7): qty 2

## 2017-06-08 MED ORDER — LATANOPROST 0.005 % OP SOLN
1.0000 [drp] | Freq: Every day | OPHTHALMIC | Status: DC
  Administered 2017-06-08 – 2017-06-09 (×2): 1 [drp] via OPHTHALMIC
  Filled 2017-06-08: qty 2.5

## 2017-06-08 MED ORDER — ONDANSETRON HCL 4 MG PO TABS: 4.0000 mg | ORAL_TABLET | Freq: Four times a day (QID) | ORAL | Status: DC | PRN

## 2017-06-08 MED ORDER — ENOXAPARIN SODIUM 30 MG/0.3ML ~~LOC~~ SOLN
30.0000 mg | SUBCUTANEOUS | Status: DC
  Administered 2017-06-08: 30 mg via SUBCUTANEOUS
  Filled 2017-06-08: qty 0.3

## 2017-06-08 MED ORDER — SODIUM CHLORIDE 0.9 % IV SOLN
100.0000 mL/h | INTRAVENOUS | Status: DC
  Administered 2017-06-09 – 2017-06-10 (×2): 100 mL/h via INTRAVENOUS

## 2017-06-08 MED ORDER — SODIUM CHLORIDE 0.9 % IV BOLUS (SEPSIS)
500.0000 mL | Freq: Once | INTRAVENOUS | Status: AC
  Administered 2017-06-08: 500 mL via INTRAVENOUS

## 2017-06-08 MED ORDER — INSULIN GLARGINE 300 UNIT/ML ~~LOC~~ SOPN: 10.0000 [IU] | PEN_INJECTOR | Freq: Every day | SUBCUTANEOUS | Status: DC

## 2017-06-08 MED ORDER — SODIUM CHLORIDE 0.9 % IV BOLUS (SEPSIS)
1000.0000 mL | Freq: Once | INTRAVENOUS | Status: AC
  Administered 2017-06-08: 1000 mL via INTRAVENOUS

## 2017-06-08 MED ORDER — SENNOSIDES-DOCUSATE SODIUM 8.6-50 MG PO TABS: 1.0000 | ORAL_TABLET | Freq: Every evening | ORAL | Status: DC | PRN

## 2017-06-08 MED ORDER — INSULIN ASPART 100 UNIT/ML ~~LOC~~ SOLN
0.0000 [IU] | Freq: Three times a day (TID) | SUBCUTANEOUS | Status: DC
  Administered 2017-06-08: 5 [IU] via SUBCUTANEOUS
  Administered 2017-06-09: 8 [IU] via SUBCUTANEOUS
  Administered 2017-06-09: 2 [IU] via SUBCUTANEOUS
  Administered 2017-06-09: 8 [IU] via SUBCUTANEOUS
  Administered 2017-06-10: 2 [IU] via SUBCUTANEOUS

## 2017-06-08 MED ORDER — ACETAMINOPHEN 325 MG PO TABS: 650.0000 mg | ORAL_TABLET | Freq: Four times a day (QID) | ORAL | Status: DC | PRN

## 2017-06-08 MED ORDER — INSULIN ASPART 100 UNIT/ML ~~LOC~~ SOLN
0.0000 [IU] | Freq: Every day | SUBCUTANEOUS | Status: DC
Start: 2017-06-08 — End: 2017-06-10
  Administered 2017-06-09: 2 [IU] via SUBCUTANEOUS

## 2017-06-08 MED ORDER — INSULIN ASPART 100 UNIT/ML ~~LOC~~ SOLN
20.0000 [IU] | Freq: Once | SUBCUTANEOUS | Status: AC
  Administered 2017-06-08: 20 [IU] via SUBCUTANEOUS

## 2017-06-08 MED ORDER — PILOCARPINE HCL 1 % OP SOLN
1.0000 [drp] | Freq: Three times a day (TID) | OPHTHALMIC | Status: DC
  Administered 2017-06-08 – 2017-06-10 (×5): 1 [drp] via OPHTHALMIC
  Filled 2017-06-08: qty 15

## 2017-06-08 MED ORDER — ISOSORBIDE MONONITRATE ER 30 MG PO TB24
60.0000 mg | ORAL_TABLET | Freq: Every day | ORAL | Status: DC
  Administered 2017-06-08 – 2017-06-10 (×3): 60 mg via ORAL
  Filled 2017-06-08 (×3): qty 2

## 2017-06-08 MED ORDER — INSULIN GLARGINE 100 UNIT/ML ~~LOC~~ SOLN
10.0000 [IU] | Freq: Every day | SUBCUTANEOUS | Status: DC
  Administered 2017-06-08: 10 [IU] via SUBCUTANEOUS
  Filled 2017-06-08 (×2): qty 0.1

## 2017-06-08 MED ORDER — ONDANSETRON HCL 4 MG/2ML IJ SOLN: 4.0000 mg | Freq: Four times a day (QID) | INTRAMUSCULAR | Status: DC | PRN

## 2017-06-08 MED ORDER — ATORVASTATIN CALCIUM 20 MG PO TABS
20.0000 mg | ORAL_TABLET | Freq: Every day | ORAL | Status: DC
  Administered 2017-06-08 – 2017-06-09 (×2): 20 mg via ORAL
  Filled 2017-06-08 (×2): qty 1

## 2017-06-08 NOTE — Plan of Care (Signed)
Patient stable since admission, BP much improved after receiving medications.  Patient says he feels much better since receiving IV fluids.  Up to bathroom independently.  Denies chest pain or shortness of breath.  Patient had cataract surgery recently on both eyes (approximately one month ago) and asked for his eye drops.  Notified attending regarding this, eye drops ordered.

## 2017-06-08 NOTE — ED Triage Notes (Signed)
Pt to ED by [facility] from work after having "high" readings on CBG machine. [facility] reports CBG 221. Pt endorses chest pain 2/10 at this time but says it was 8/10 and sharp. Pt goes in and out of speaking coherently, with EMS reporting coherence and then "babbling."

## 2017-06-08 NOTE — Progress Notes (Signed)
Patient received on unit from emergency room.  BP elevated, BP meds given.  Patient states he feels much already, no complaints of chest pain or shortness of breath.

## 2017-06-08 NOTE — Progress Notes (Signed)
Received critical troponin 0.24 on 06/08/17 at 1420.  Dr. Nehemiah Settle text paged with results.

## 2017-06-08 NOTE — ED Provider Notes (Signed)
[facility] EMERGENCY DEPARTMENT Provider Note   CSN: [id number] Arrival date & time: 06/08/17  0706     History   Chief Complaint Chief Complaint  Patient presents with  . Altered Mental Status    HPI Robert Chaney is a 66 y.o. male.  HPI  Patient presents from his place of employment due to concern of altered mental status. The patient himself is sleepy, but awakens briefly, easily, denies current pain, but states that about 6 hours ago he felt unwell, had difficulty working, due to weakness. He had some transient chest pain. Currently he denies any chest pain, states that he feels weak, cannot distinguish where he feels weak. He denies difficulty with speaking, seeing, denies nausea, vomiting. EMS reports the coworkers found the patient has difficulty with speech possibly between 4 and 6 hours ago. Reportedly the patient had blood glucose checked there, found to be elevated. They note that in route to the patient was seen by the name of the stable, awakened easily, but fell asleep again quickly. The patient himself when awake, denies recent medication changes, diet changes, states that he was well prior to the onset of illness about 6 hours ago.  Past Medical History:  Diagnosis Date  . Anemia   . Diabetes mellitus   . Diastolic CHF, chronic (HCC)    A.  03/2009 Echo: EF 60-65%, Gr II diast dysfxn  . Glaucoma   . Hyperlipidemia    HYPERCHOLESTEROLEMIA  . Hypertension    MARKED LEFT VENTRICULAR HYPERTROPHY BY PREVIOUS ECHOCARDIOGRAM--HE HAS HYPERDYNAMIC LEFT VENTRICULAR SYSTOLIC FUNCTION AND HAS IMPAIRED RELAXATION BY ECHO  . Hypertrophic cardiomyopathy ([place])   . Murmur    A.  Mild AI by 10/10 echo  . NSVT (nonsustained ventricular tachycardia) ([place])   . Premature atrial contractions   . PVC's (premature ventricular contractions)   . SVT (supraventricular tachycardia) [facility])     Patient Active Problem List   Diagnosis Date Noted  . Hypertensive  heart disease 11/07/2016  . 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[place]) 07/26/2015  . Dyslipidemia 05/19/2013  . Diabetic hyperosmolar non-ketotic state ([place]) 07/19/2011  . DM (diabetes mellitus) with complications ([place]) 85/63/1497  . Acute on chronic kidney failure ([place]) 07/19/2011  . Troponin level elevated 07/19/2011  . Abnormal EKG 07/19/2011  . Hyperkalemia 07/19/2011  . Volume depletion 07/19/2011  . Diastolic CHF, chronic ([place]) 07/19/2011  . HTN (hypertension), benign 07/19/2011  . Elevated CPK 07/19/2011  . Hypercholesterolemia 02/20/2011  . Benign hypertensive heart disease without heart failure 02/20/2011    Past Surgical History:  Procedure Laterality Date  . NO PAST SURGERIES         Home Medications    Prior to Admission medications   Medication Sig Start Date End Date Taking? Authorizing Provider  Alcohol Swabs PADS 1 each by Does not apply route once. 07/24/11   Dhungel, Nishant, MD  amLODipine (NORVASC) 10 MG tablet TAKE ONE TABLET BY MOUTH AT BEDTIME 04/21/17   Dorothy Spark, MD  atorvastatin (LIPITOR) 20 MG tablet TAKE ONE TABLET BY MOUTH ONCE DAILY 06/20/16   Imogene Burn, PA-C  B-D ULTRAFINE III SHORT PEN 31G X 8 MM MISC Inject 10 Units as directed 3 (three) times daily.  11/08/14   [provider]  carvedilol (COREG) 25 MG tablet Take 1 tablet (25 mg total) 2 (two) times daily by mouth. 05/13/17 08/11/17  Lyda Jester M, PA-C  hydrALAZINE (APRESOLINE) 50 MG tablet Take 1 tablet (50 mg total) by  mouth 3 (three) times daily. 06/06/17 09/04/17  Dorothy Spark, MD  hydrochlorothiazide (MICROZIDE) 12.5 MG capsule TAKE ONE CAPSULE BY MOUTH EVERY OTHER DAY 04/17/17   Dorothy Spark, MD  Insulin Glargine (TOUJEO SOLOSTAR) 300 UNIT/ML SOPN Inject 10 Units into the skin at bedtime.    [provider]  Insulin Syringe-Needle U-100 (INSULIN SYRINGE 1CC/28G) 28G X 1/2" 1 ML MISC 1 each by Does not apply route once. 07/24/11    Dhungel, Flonnie Overman, MD  isosorbide mononitrate (IMDUR) 60 MG 24 hr tablet Take 1 tablet (60 mg total) by mouth daily. 03/29/16   Dorothy Spark, MD  nitroGLYCERIN (NITROSTAT) 0.4 MG SL tablet Place 1 tablet (0.4 mg total) under the tongue every 5 (five) minutes as needed for chest pain. 03/29/16   Dorothy Spark, MD  Vitamin D, Ergocalciferol, (DRISDOL) 50000 units CAPS capsule Take 50,000 Units by mouth every 7 (seven) days.    [provider]    Family History Family History  Problem Relation Age of Onset  . Hypertension Father   . Diabetes Brother   . Diabetes Brother     Social History Social History   Tobacco Use  . Smoking status: Never Smoker  . Smokeless tobacco: Never Used  Substance Use Topics  . Alcohol use: No  . Drug use: No     Allergies   Aspirin   Review of Systems Review of Systems  Constitutional:       Per HPI, otherwise negative  HENT:       Per HPI, otherwise negative  Respiratory:       Per HPI, otherwise negative  Cardiovascular:       Per HPI, otherwise negative  Gastrointestinal: Negative for vomiting.  Endocrine:       Diabetes  Genitourinary:       Neg aside from HPI   Musculoskeletal:       Per HPI, otherwise negative  Skin: Negative.   Neurological: Positive for weakness. Negative for syncope.     Physical Exam Updated Vital Signs BP (!) 131/48 (BP Location: Right Arm)   Pulse 76   Temp 98.4 F (36.9 C) (Oral)   Resp 15   Ht 5\' 8"  (1.727 m)   Wt 74.4 kg (164 lb)   SpO2 95%   BMI 24.94 kg/m   Physical Exam  Constitutional: He is oriented to person, place, and time. He appears well-developed. No distress.  Sleeping elderly appearing male who awakens easily, speaks briefly, falls asleep again quickly.  HENT:  Head: Normocephalic and atraumatic.  Eyes: Conjunctivae and EOM are normal.  Cardiovascular: Normal rate and regular rhythm.  Pulmonary/Chest: Effort normal. No stridor. No respiratory distress.    Abdominal: He exhibits no distension.  Musculoskeletal: He exhibits no edema.  Neurological: He is alert and oriented to person, place, and time.  Skin: Skin is warm and dry.  Psychiatric: He has a normal mood and affect.  Nursing note and vitals reviewed.    ED Treatments / Results  Labs (all labs ordered are listed, but only abnormal results are displayed) Labs Reviewed  CBC - Abnormal; Notable for the following components:      Result Value   RBC 3.53 (*)    Hemoglobin 11.4 (*)    HCT 33.8 (*)    All other components within normal limits  COMPREHENSIVE METABOLIC PANEL - Abnormal; Notable for the following components:   Potassium 3.3 (*)    Glucose, Bld 333 (*)  BUN 41 (*)    Creatinine, Ser 3.07 (*)    Albumin 3.3 (*)    GFR calc non Af Amer 19 (*)    GFR calc Af Amer 22 (*)    All other components within normal limits  I-STAT TROPONIN, ED - Abnormal; Notable for the following components:   Troponin i, poc 0.10 (*)    All other components within normal limits  PROTIME-INR  APTT  DIFFERENTIAL  RAPID URINE DRUG SCREEN, HOSP PERFORMED  URINALYSIS, ROUTINE W REFLEX MICROSCOPIC  TROPONIN I  BRAIN NATRIURETIC PEPTIDE    EKG  EKG Interpretation  Date/Time:  Sunday June 08 2017 07:50:56 EST Ventricular Rate:  70 PR Interval:    QRS Duration: 88 QT Interval:  430 QTC Calculation: 464 R Axis:   73 Text Interpretation:  Sinus rhythm LVH with secondary repolarization abnormality ST depression, consider ischemia, diffuse lds No significant change was found Confirmed by Ezequiel Essex [phone]) on 06/08/2017 8:00:50 AM       Radiology Ct Head Wo Contrast  Result Date: 06/08/2017 CLINICAL DATA:  Altered mental status EXAM: CT HEAD WITHOUT CONTRAST TECHNIQUE: Contiguous axial images were obtained from the base of the skull through the vertex without intravenous contrast. COMPARISON:  None. FINDINGS: Brain: Chronic ischemic changes are present in the  periventricular white matter. There is encephalomalacia in the left cerebellar hemisphere. Lacune or infarct in the left basal ganglia. Mild global atrophy. There is no mass effect, midline shift, or acute hemorrhage. Vascular: No hyperdense vessel or unexpected calcification. Skull: Cranium is intact. Sinuses/Orbits: Mild mucosal thickening throughout the left ethmoid air cells. Orbits are unremarkable. Mastoid air cells are clear. Other: Noncontributory. IMPRESSION: Chronic ischemic changes and atrophy. No acute intracranial pathology. Electronically Signed   By: Marybelle Killings M.D.   On: 06/08/2017 09:04    Procedures Procedures (including critical care time)  Medications Ordered in ED Medications  sodium chloride 0.9 % bolus 500 mL (500 mLs Intravenous New Bag/Given 06/08/17 0957)    Followed by  0.9 %  sodium chloride infusion (not administered)     Initial Impression / Assessment and Plan / ED Course  I have reviewed the triage vital signs and the nursing notes.  Pertinent labs & imaging results that were available during my care of the patient were reviewed by me and considered in my medical decision making (see chart for details).  Initial labs notable for trivial elevation in troponin. This is similar to prior elevations.  Patient denies any ongoing chest pain.  Update:, Remaining labs notable for acute kidney injury, with a creatinine of greater than 3, BUN greater than 40.  She states that he feels somewhat better, denies any ongoing pain anywhere. Patient is aware of all findings, has been receiving fluid resuscitation since arrival. Head CT unremarkable.  This elderly male with multiple medical issues including diabetes presents with concern of altered mental status. Patient has been complaining of weakness since arrival, but has had no speech deficits, and has had no complaints of pain. Patient's new acute kidney injury may be contributing to his episode of altered mental  status given the reassuring CT scan, neurologic exam per Patient does have a borderline abnormal troponin, but has no ongoing chest pain, and has had prior abnormalities in the past, and given his consideration of renal dysfunction, there is some suspicion for artifact. However, given the patient's overall change from baseline, with altered mental status, acute kidney injury, he will be admitted for further evaluation and  management.  Final Clinical Impressions(s) / ED Diagnoses  Altered mental status Acute kidney injury   Carmin Muskrat, MD 06/08/17 1051

## 2017-06-08 NOTE — Progress Notes (Signed)
Text page sent to MD regarding troponin 0.32. Patient is asymptomatic, no chest pain or pressure, no shortness of breath.

## 2017-06-08 NOTE — H&P (Signed)
History and Physical  Robert Chaney FXJ:[id number] DOB: [birth year] DOA: 06/08/2017  Referring physician: Dr Vanita Panda, ED physician PCP: [name], MD  Outpatient Specialists: none  Patient Coming From: home via work  Chief Complaint: AMS  HPI: Robert Chaney is a 66 y.o. male with a history of DM2, HTN, hypertrophic cardiomyopathy, chronic kidney disease stage 3, hyperlipidemia. 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 He was experiencing blurred vision, had difficulty forming words per coworkers.  Patient had elevated blood sugars was checked by the nursing home staff.  His blood pressure was also checked.  Both these were elevated significantly.  Patient reports that his blood sugars have been high over the past 2-3 days, despite taking his insulin.  He does report missing his blood pressure medication this morning.  Did have a brief episode of chest pain that is now resolved without medication.  No palliating or provoking factors.  Per ED staff, patient lucid and without evidence of altered mental status or confusion, just somnolent.  Emergency Department Course: CT head normal.  Patient received IV fluids.  UA and urine drug screen pending  Review of Systems:   Pt denies any fevers, chills, nausea, vomiting, diarrhea, constipation, abdominal pain, shortness of breath, dyspnea on exertion, orthopnea, cough, wheezing, palpitations, headache, vision changes, lightheadedness, dizziness, melena, rectal bleeding.  Review of systems are otherwise negative  Past Medical History:  Diagnosis Date  . Anemia   . Diabetes mellitus   . Diastolic CHF, chronic (HCC)    A.  03/2009 Echo: EF 60-65%, Gr II diast dysfxn  . Glaucoma   . Hyperlipidemia    HYPERCHOLESTEROLEMIA  . Hypertension    MARKED LEFT VENTRICULAR HYPERTROPHY BY PREVIOUS ECHOCARDIOGRAM--HE HAS HYPERDYNAMIC LEFT VENTRICULAR SYSTOLIC FUNCTION AND HAS IMPAIRED RELAXATION BY ECHO  . Hypertrophic  cardiomyopathy ([place])   . Murmur    A.  Mild AI by 10/10 echo  . NSVT (nonsustained ventricular tachycardia) ([place])   . Premature atrial contractions   . PVC's (premature ventricular contractions)   . SVT (supraventricular tachycardia) (HCC)    Past Surgical History:  Procedure Laterality Date  . NO PAST SURGERIES     Social History:  reports that  has never smoked. he has never used smokeless tobacco. He reports that he does not drink alcohol or use drugs. Patient lives at home  Allergies  Allergen Reactions  . Aspirin     itching    Family History  Problem Relation Age of Onset  . Hypertension Father   . Diabetes Brother   . Diabetes Brother       Prior to Admission medications   Medication Sig Start Date End Date Taking? Authorizing Provider  Alcohol Swabs PADS 1 each by Does not apply route once. 07/24/11   Dhungel, Nishant, MD  amLODipine (NORVASC) 10 MG tablet TAKE ONE TABLET BY MOUTH AT BEDTIME 04/21/17   Dorothy Spark, MD  atorvastatin (LIPITOR) 20 MG tablet TAKE ONE TABLET BY MOUTH ONCE DAILY 06/20/16   Imogene Burn, PA-C  B-D ULTRAFINE III SHORT PEN 31G X 8 MM MISC Inject 10 Units as directed 3 (three) times daily.  11/08/14   [provider]  carvedilol (COREG) 25 MG tablet Take 1 tablet (25 mg total) 2 (two) times daily by mouth. 05/13/17 08/11/17  Lyda Jester M, PA-C  hydrALAZINE (APRESOLINE) 50 MG tablet Take 1 tablet (50 mg total) by mouth 3 (three) times daily. 06/06/17 09/04/17  Ena Dawley  H, MD  hydrochlorothiazide (MICROZIDE) 12.5 MG capsule TAKE ONE CAPSULE BY MOUTH EVERY OTHER DAY 04/17/17   Dorothy Spark, MD  Insulin Glargine (TOUJEO SOLOSTAR) 300 UNIT/ML SOPN Inject 10 Units into the skin at bedtime.    [provider]  Insulin Syringe-Needle U-100 (INSULIN SYRINGE 1CC/28G) 28G X 1/2" 1 ML MISC 1 each by Does not apply route once. 07/24/11   Dhungel, Flonnie Overman, MD  isosorbide mononitrate (IMDUR) 60 MG 24 hr tablet Take  1 tablet (60 mg total) by mouth daily. 03/29/16   Dorothy Spark, MD  nitroGLYCERIN (NITROSTAT) 0.4 MG SL tablet Place 1 tablet (0.4 mg total) under the tongue every 5 (five) minutes as needed for chest pain. 03/29/16   Dorothy Spark, MD  Vitamin D, Ergocalciferol, (DRISDOL) 50000 units CAPS capsule Take 50,000 Units by mouth every 7 (seven) days.    [provider]    Physical Exam: BP (!) 191/76   Pulse 67   Temp 98.6 F (37 C)   Resp 18   Ht 5\' 8"  (1.727 m)   Wt 74.4 kg (164 lb)   SpO2 97%   BMI 24.94 kg/m   General: Elderly black male. Awake and alert and oriented x3. No acute cardiopulmonary distress.  HEENT: Normocephalic atraumatic.  Right and left ears normal in appearance.  Pupils equal, round, reactive to light. Extraocular muscles are intact. Sclerae anicteric and noninjected.  Dry mucosal membranes. No mucosal lesions.  Neck: Neck supple without lymphadenopathy. No carotid bruits. No masses palpated.  Cardiovascular: Regular rate with normal S1-S2 sounds. No murmurs, rubs, gallops auscultated. No JVD.  Respiratory: Good respiratory effort with no wheezes, rales, rhonchi. Lungs clear to auscultation bilaterally.  No accessory muscle use. Abdomen: Soft, nontender, nondistended. Active bowel sounds. No masses or hepatosplenomegaly  Skin: No rashes, lesions, or ulcerations.  Dry, warm to touch. 2+ dorsalis pedis and radial pulses. Musculoskeletal: No calf or leg pain. All major joints not erythematous nontender.  No upper or lower joint deformation.  Good ROM.  No contractures  Psychiatric: Intact judgment and insight. Pleasant and cooperative. Neurologic: No focal neurological deficits. Strength is 5/5 and symmetric in upper and lower extremities.  Cranial nerves II through XII are grossly intact.           Labs on Admission: I have personally reviewed following labs and imaging studies  CBC: Recent Labs  Lab 06/08/17 0716  WBC 6.4  NEUTROABS 4.7  HGB  11.4*  HCT 33.8*  MCV 95.8  PLT 175   Basic Metabolic Panel: Recent Labs  Lab 06/08/17 0716  NA 138  K 3.3*  CL 102  CO2 27  GLUCOSE 333*  BUN 41*  CREATININE 3.07*  CALCIUM 9.6   GFR: Estimated Creatinine Clearance: 22.3 mL/min (A) (by C-G formula based on SCr of 3.07 mg/dL (H)). Liver Function Tests: Recent Labs  Lab 06/08/17 0716  AST 21  ALT 18  ALKPHOS 100  BILITOT 0.6  PROT 7.1  ALBUMIN 3.3*   No results for input(s): LIPASE, AMYLASE in the last 168 hours. No results for input(s): AMMONIA in the last 168 hours. Coagulation Profile: Recent Labs  Lab 06/08/17 0716  INR 1.05   Cardiac Enzymes: No results for input(s): CKTOTAL, CKMB, CKMBINDEX, TROPONINI in the last 168 hours. BNP (last 3 results) No results for input(s): PROBNP in the last 8760 hours. HbA1C: No results for input(s): HGBA1C in the last 72 hours. CBG: No results for input(s): GLUCAP in the last 168  hours. Lipid Profile: No results for input(s): CHOL, HDL, LDLCALC, TRIG, CHOLHDL, LDLDIRECT in the last 72 hours. Thyroid Function Tests: No results for input(s): TSH, T4TOTAL, FREET4, T3FREE, THYROIDAB in the last 72 hours. Anemia Panel: No results for input(s): VITAMINB12, FOLATE, FERRITIN, TIBC, IRON, RETICCTPCT in the last 72 hours. Urine analysis:    Component Value Date/Time   COLORURINE YELLOW 07/19/2011 [place] 07/19/2011 1335   LABSPEC 1.025 07/19/2011 1335   PHURINE 5.0 07/19/2011 1335   GLUCOSEU >1000 (A) 07/19/2011 1335   HGBUR SMALL (A) 07/19/2011 1335   BILIRUBINUR SMALL (A) 07/19/2011 1335   KETONESUR NEGATIVE 07/19/2011 1335   PROTEINUR NEGATIVE 07/19/2011 1335   UROBILINOGEN 1.0 07/19/2011 1335   NITRITE NEGATIVE 07/19/2011 1335   LEUKOCYTESUR TRACE (A) 07/19/2011 1335   Sepsis Labs: @LABRCNTIP (procalcitonin:4,lacticidven:4) )No results found for this or any previous visit (from the past 240 hour(s)).   Radiological Exams on Admission: Ct Head Wo  Contrast  Result Date: 06/08/2017 CLINICAL DATA:  Altered mental status EXAM: CT HEAD WITHOUT CONTRAST TECHNIQUE: Contiguous axial images were obtained from the base of the skull through the vertex without intravenous contrast. COMPARISON:  None. FINDINGS: Brain: Chronic ischemic changes are present in the periventricular white matter. There is encephalomalacia in the left cerebellar hemisphere. Lacune or infarct in the left basal ganglia. Mild global atrophy. There is no mass effect, midline shift, or acute hemorrhage. Vascular: No hyperdense vessel or unexpected calcification. Skull: Cranium is intact. Sinuses/Orbits: Mild mucosal thickening throughout the left ethmoid air cells. Orbits are unremarkable. Mastoid air cells are clear. Other: Noncontributory. IMPRESSION: Chronic ischemic changes and atrophy. No acute intracranial pathology. Electronically Signed   By: Marybelle Killings M.D.   On: 06/08/2017 09:04    EKG: Independently reviewed.  Sinus rhythm.  LVH.  Diffuse T wave inversion that is more prominent than previous  Assessment/Plan: Principal Problem:   Acute on chronic kidney failure (HCC) Active Problems:   Benign hypertensive heart disease without heart failure   DM (diabetes mellitus) with complications (HCC)   Troponin level elevated   Volume depletion   HTN (hypertension), benign    This patient was discussed with the ED physician, including pertinent vitals, physical exam findings, labs, and imaging.  We also discussed care given by the ED provider.  1.  Acute on chronic kidney failure  Fluid resuscitation  Recheck creatinine 2.  Elevated troponin level  Telemetry monitoring  Serial troponins 3.  Volume depletion  Continue fluid resuscitation as above 4.  Diabetes  Patient hyperglycemic, which may be source of patient's dehydration and acute renal injury  Recheck CBG and will give supplemental insulin as needed  Sliding scale insulin with CBGs before meals and  nightly 5.  Hypertensive heart disease without heart failure 6.  Hypertension  We will give home antihypertensives  DVT prophylaxis: Lovenox Consultants: None Code Status: Full code Family Communication: None Disposition Plan: Home following hospital stay   Truett Mainland, DO [facility] Pager [phone]  If 7PM-7AM, please contact night-coverage [email or web address] Password [email or web address]

## 2017-06-09 DIAGNOSIS — Z833 Family history of diabetes mellitus: Secondary | ICD-10-CM | POA: Diagnosis not present

## 2017-06-09 DIAGNOSIS — E1165 Type 2 diabetes mellitus with hyperglycemia: Secondary | ICD-10-CM | POA: Diagnosis present

## 2017-06-09 DIAGNOSIS — I5032 Chronic diastolic (congestive) heart failure: Secondary | ICD-10-CM | POA: Diagnosis present

## 2017-06-09 DIAGNOSIS — I130 Hypertensive heart and chronic kidney disease with heart failure and stage 1 through stage 4 chronic kidney disease, or unspecified chronic kidney disease: Secondary | ICD-10-CM | POA: Diagnosis present

## 2017-06-09 DIAGNOSIS — N179 Acute kidney failure, unspecified: Secondary | ICD-10-CM | POA: Diagnosis present

## 2017-06-09 DIAGNOSIS — Z79899 Other long term (current) drug therapy: Secondary | ICD-10-CM | POA: Diagnosis not present

## 2017-06-09 DIAGNOSIS — Z794 Long term (current) use of insulin: Secondary | ICD-10-CM | POA: Diagnosis not present

## 2017-06-09 DIAGNOSIS — E118 Type 2 diabetes mellitus with unspecified complications: Secondary | ICD-10-CM | POA: Diagnosis not present

## 2017-06-09 DIAGNOSIS — I472 Ventricular tachycardia: Secondary | ICD-10-CM | POA: Diagnosis present

## 2017-06-09 DIAGNOSIS — I471 Supraventricular tachycardia: Secondary | ICD-10-CM | POA: Diagnosis present

## 2017-06-09 DIAGNOSIS — H409 Unspecified glaucoma: Secondary | ICD-10-CM | POA: Diagnosis present

## 2017-06-09 DIAGNOSIS — R748 Abnormal levels of other serum enzymes: Secondary | ICD-10-CM | POA: Diagnosis present

## 2017-06-09 DIAGNOSIS — R4182 Altered mental status, unspecified: Secondary | ICD-10-CM | POA: Diagnosis present

## 2017-06-09 DIAGNOSIS — Z8249 Family history of ischemic heart disease and other diseases of the circulatory system: Secondary | ICD-10-CM | POA: Diagnosis not present

## 2017-06-09 DIAGNOSIS — E860 Dehydration: Secondary | ICD-10-CM | POA: Diagnosis present

## 2017-06-09 DIAGNOSIS — N183 Chronic kidney disease, stage 3 (moderate): Secondary | ICD-10-CM | POA: Diagnosis present

## 2017-06-09 DIAGNOSIS — Z886 Allergy status to analgesic agent status: Secondary | ICD-10-CM | POA: Diagnosis not present

## 2017-06-09 DIAGNOSIS — E785 Hyperlipidemia, unspecified: Secondary | ICD-10-CM | POA: Diagnosis present

## 2017-06-09 DIAGNOSIS — I491 Atrial premature depolarization: Secondary | ICD-10-CM | POA: Diagnosis present

## 2017-06-09 DIAGNOSIS — I10 Essential (primary) hypertension: Secondary | ICD-10-CM | POA: Diagnosis not present

## 2017-06-09 DIAGNOSIS — I119 Hypertensive heart disease without heart failure: Secondary | ICD-10-CM | POA: Diagnosis not present

## 2017-06-09 DIAGNOSIS — I422 Other hypertrophic cardiomyopathy: Secondary | ICD-10-CM | POA: Diagnosis present

## 2017-06-09 DIAGNOSIS — E1122 Type 2 diabetes mellitus with diabetic chronic kidney disease: Secondary | ICD-10-CM | POA: Diagnosis present

## 2017-06-09 DIAGNOSIS — I493 Ventricular premature depolarization: Secondary | ICD-10-CM | POA: Diagnosis present

## 2017-06-09 LAB — GLUCOSE, CAPILLARY
GLUCOSE-CAPILLARY: 134 mg/dL — AB (ref 65–99)
GLUCOSE-CAPILLARY: 277 mg/dL — AB (ref 65–99)
Glucose-Capillary: 274 mg/dL — ABNORMAL HIGH (ref 65–99)
Glucose-Capillary: 247 mg/dL — ABNORMAL HIGH (ref 65–99)

## 2017-06-09 LAB — BASIC METABOLIC PANEL
ANION GAP: 3 — AB (ref 5–15)
Anion gap: 5 (ref 5–15)
BUN: 40 mg/dL — ABNORMAL HIGH (ref 6–20)
BUN: 40 mg/dL — ABNORMAL HIGH (ref 6–20)
CALCIUM: 8.7 mg/dL — AB (ref 8.9–10.3)
CHLORIDE: 107 mmol/L (ref 101–111)
CHLORIDE: 104 mmol/L (ref 101–111)
CO2: 28 mmol/L (ref 22–32)
CO2: 28 mmol/L (ref 22–32)
CREATININE: 2.22 mg/dL — AB (ref 0.61–1.24)
Calcium: 8.2 mg/dL — ABNORMAL LOW (ref 8.9–10.3)
Creatinine, Ser: 2.09 mg/dL — ABNORMAL HIGH (ref 0.61–1.24)
GFR calc Af Amer: 36 mL/min — ABNORMAL LOW (ref >60)
GFR calc non Af Amer: 29 mL/min — ABNORMAL LOW (ref >60)
GFR calc non Af Amer: 31 mL/min — ABNORMAL LOW (ref >60)
GFR, EST AFRICAN AMERICAN: 33 mL/min — AB (ref >60)
GLUCOSE: 351 mg/dL — AB (ref 65–99)
Glucose, Bld: 131 mg/dL — ABNORMAL HIGH (ref 65–99)
POTASSIUM: 4.3 mmol/L (ref 3.5–5.1)
Potassium: 4.3 mmol/L (ref 3.5–5.1)
SODIUM: 140 mmol/L (ref 135–145)
Sodium: 135 mmol/L (ref 135–145)

## 2017-06-09 MED ORDER — INSULIN GLARGINE 100 UNIT/ML ~~LOC~~ SOLN
20.0000 [IU] | Freq: Every day | SUBCUTANEOUS | Status: DC
  Administered 2017-06-09: 20 [IU] via SUBCUTANEOUS
  Filled 2017-06-09: qty 0.2

## 2017-06-09 MED ORDER — INSULIN ASPART 100 UNIT/ML ~~LOC~~ SOLN
4.0000 [IU] | Freq: Three times a day (TID) | SUBCUTANEOUS | Status: DC
  Administered 2017-06-10: 4 [IU] via SUBCUTANEOUS

## 2017-06-09 MED ORDER — ENOXAPARIN SODIUM 40 MG/0.4ML ~~LOC~~ SOLN
40.0000 mg | SUBCUTANEOUS | Status: DC
  Administered 2017-06-09: 40 mg via SUBCUTANEOUS
  Filled 2017-06-09: qty 0.4

## 2017-06-09 NOTE — Progress Notes (Signed)
PROGRESS NOTE    Robert Chaney  WJX:[id number] DOB: 1950/12/07 DOA: 06/08/2017 PCP: [name], MD   Outpatient Specialists:     Brief Narrative:  Robert Chaney is a 66 y.o. male with a history of DM2, HTN, hypertrophic cardiomyopathy, chronic kidney disease stage 3, hyperlipidemia. 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 He was experiencing blurred vision, had difficulty forming words per coworkers.  Patient had elevated blood sugars was checked by the nursing home staff.  His blood pressure was also checked.  Both these were elevated significantly.  Patient reports that his blood sugars have been high over the past 2-3 days, despite taking his insulin.  He does report missing his blood pressure medication this morning.  Did have a brief episode of chest pain that is now resolved without medication.  No palliating or provoking factors.  Per ED staff, patient lucid and without evidence of altered mental status or confusion, just somnolent.     Assessment & Plan:   Principal Problem:   Acute on chronic kidney failure (HCC) Active Problems:   Benign hypertensive heart disease without heart failure   DM (diabetes mellitus) with complications (HCC)   Troponin level elevated   Volume depletion   HTN (hypertension), benign   AKI on CKD -baseline Cr of 1.5 -IVF overnight again  DM -SSI -resume home lantus -patient was not rotating sites  HTN -resume home meds  Elevated troponin -suspect from elevated BP and CR    DVT prophylaxis:  Lovenox   Code Status: Full Code     Disposition Plan:  Home in AM if Cr improved   Consultants:        Subjective: Feeling much better   Objective: Vitals:   06/09/17 0055 06/09/17 0504 06/09/17 0842 06/09/17 1158  BP: (!) 106/41 (!) 142/70 (!) 152/66 117/60  Pulse: (!) 59 62 66 64  Resp: 18 18  18   Temp: 97.9 F (36.6 C) 97.9 F (36.6 C) 98.1 F (36.7 C) 98 F (36.7 C)    TempSrc: Oral Oral Oral Oral  SpO2: 99% 99% 100% 100%  Weight:  79.5 kg (175 lb 4.8 oz)    Height:        Intake/Output Summary (Last 24 hours) at 06/09/2017 1653 Last data filed at 06/09/2017 1330 Gross per 24 hour  Intake 720 ml  Output 1420 ml  Net -700 ml   Filed Weights   06/08/17 0717 06/08/17 1230 06/09/17 0504  Weight: 74.4 kg (164 lb) 76.4 kg (168 lb 7 oz) 79.5 kg (175 lb 4.8 oz)    Examination:  General exam: Appears calm and comfortable  Respiratory system: Clear to auscultation. Respiratory effort normal. Cardiovascular system: S1 & S2 heard, RRR. No JVD, murmurs, rubs, gallops or clicks. No pedal edema. Gastrointestinal system: Abdomen is nondistended, soft and nontender. No organomegaly or masses felt. Normal bowel sounds heard. Central nervous system: Alert and oriented. No focal neurological deficits. Extremities: Symmetric 5 x 5 power. Skin: No rashes, lesions or ulcers Psychiatry: Judgement and insight appear normal. Mood & affect appropriate.     Data Reviewed: I have personally reviewed following labs and imaging studies  CBC: Recent Labs  Lab 06/08/17 0716  WBC 6.4  NEUTROABS 4.7  HGB 11.4*  HCT 33.8*  MCV 95.8  PLT 213   Basic Metabolic Panel: Recent Labs  Lab 06/08/17 0716 06/09/17 0452 06/09/17 1548  NA 138 140 135  K 3.3* 4.3 4.3  CL  102 107 104  CO2 27 28 28   GLUCOSE 333* 131* 351*  BUN 41* 40* 40*  CREATININE 3.07* 2.22* 2.09*  CALCIUM 9.6 8.7* 8.2*   GFR: Estimated Creatinine Clearance: 32.7 mL/min (A) (by C-G formula based on SCr of 2.09 mg/dL (H)). Liver Function Tests: Recent Labs  Lab 06/08/17 0716  AST 21  ALT 18  ALKPHOS 100  BILITOT 0.6  PROT 7.1  ALBUMIN 3.3*   No results for input(s): LIPASE, AMYLASE in the last 168 hours. No results for input(s): AMMONIA in the last 168 hours. Coagulation Profile: Recent Labs  Lab 06/08/17 0716  INR 1.05   Cardiac Enzymes: Recent Labs  Lab 06/08/17 1254  06/08/17 1625 06/08/17 1832  TROPONINI 0.24* 0.32* 0.31*   BNP (last 3 results) No results for input(s): PROBNP in the last 8760 hours. HbA1C: No results for input(s): HGBA1C in the last 72 hours. CBG: Recent Labs  Lab 06/08/17 1608 06/08/17 1834 06/08/17 2055 06/09/17 0738 06/09/17 1156  GLUCAP 462* 384* 175* 134* 274*   Lipid Profile: No results for input(s): CHOL, HDL, LDLCALC, TRIG, CHOLHDL, LDLDIRECT in the last 72 hours. Thyroid Function Tests: No results for input(s): TSH, T4TOTAL, FREET4, T3FREE, THYROIDAB in the last 72 hours. Anemia Panel: No results for input(s): VITAMINB12, FOLATE, FERRITIN, TIBC, IRON, RETICCTPCT in the last 72 hours. Urine analysis:    Component Value Date/Time   COLORURINE YELLOW 06/08/2017 1116   APPEARANCEUR HAZY (A) 06/08/2017 1116   LABSPEC 1.017 06/08/2017 1116   PHURINE 5.0 06/08/2017 1116   GLUCOSEU >=500 (A) 06/08/2017 1116   HGBUR MODERATE (A) 06/08/2017 1116   BILIRUBINUR NEGATIVE 06/08/2017 1116   KETONESUR NEGATIVE 06/08/2017 1116   PROTEINUR 100 (A) 06/08/2017 1116   UROBILINOGEN 1.0 07/19/2011 1335   NITRITE NEGATIVE 06/08/2017 1116   LEUKOCYTESUR SMALL (A) 06/08/2017 1116     )No results found for this or any previous visit (from the past 240 hour(s)).    Anti-infectives (From admission, onward)   None       Radiology Studies: Ct Head Wo Contrast  Result Date: 06/08/2017 CLINICAL DATA:  Altered mental status EXAM: CT HEAD WITHOUT CONTRAST TECHNIQUE: Contiguous axial images were obtained from the base of the skull through the vertex without intravenous contrast. COMPARISON:  None. FINDINGS: Brain: Chronic ischemic changes are present in the periventricular white matter. There is encephalomalacia in the left cerebellar hemisphere. Lacune or infarct in the left basal ganglia. Mild global atrophy. There is no mass effect, midline shift, or acute hemorrhage. Vascular: No hyperdense vessel or unexpected calcification.  Skull: Cranium is intact. Sinuses/Orbits: Mild mucosal thickening throughout the left ethmoid air cells. Orbits are unremarkable. Mastoid air cells are clear. Other: Noncontributory. IMPRESSION: Chronic ischemic changes and atrophy. No acute intracranial pathology. Electronically Signed   By: Marybelle Killings M.D.   On: 06/08/2017 09:04        Scheduled Meds: . amLODipine  10 mg Oral QHS  . atorvastatin  20 mg Oral q1800  . carvedilol  25 mg Oral BID  . enoxaparin (LOVENOX) injection  40 mg Subcutaneous Q24H  . hydrALAZINE  50 mg Oral TID  . insulin aspart  0-15 Units Subcutaneous TID WC  . insulin aspart  0-5 Units Subcutaneous QHS  . [START ON 06/10/2017] insulin aspart  4 Units Subcutaneous TID WC  . insulin glargine  20 Units Subcutaneous QHS  . isosorbide mononitrate  60 mg Oral Daily  . latanoprost  1 drop Both Eyes QHS  . pilocarpine  1 drop Both Eyes TID   Continuous Infusions: . sodium chloride 100 mL/hr (06/09/17 1641)     LOS: 0 days    Time spent: 35 min    Geradine Girt, DO [facility] Pager [phone]  If 7PM-7AM, please contact night-coverage [email or web address] Password [email or web address] 06/09/2017, 4:53 PM

## 2017-06-09 NOTE — Progress Notes (Signed)
Inpatient Diabetes Program Recommendations  AACE/ADA: New Consensus Statement on Inpatient Glycemic Control (2015)  Target Ranges:  Prepandial:   less than 140 mg/dL      Peak postprandial:   less than 180 mg/dL (1-2 hours)      Critically ill patients:  140 - 180 mg/dL   Results for JAHN, FRANCHINI (MRN [id number]) as of 06/09/2017 13:42  Ref. Range 06/08/2017 11:58 06/08/2017 16:08 06/08/2017 18:34 06/08/2017 20:55  Glucose-Capillary Latest Ref Range: 65 - 99 mg/dL 230 (H) 462 (H) 384 (H) 175 (H)   Results for HEBERT, DOOLING (MRN [id number]) as of 06/09/2017 13:42  Ref. Range 06/09/2017 07:38 06/09/2017 11:56  Glucose-Capillary Latest Ref Range: 65 - 99 mg/dL 134 (H) 274 (H)    Home DM Meds: Toujeo 20 units QHS  Current Insulin Orders: Lantus 10 units QHS      Novolog Moderate Correction Scale/ SSI (0-15 units) TID AC + HS      MD- Please consider starting Novolog Meal Coverage for this patient:  Novolog 4 units TID with meals (hold if pt eats <50% of meal)    Spoke with patient this afternoon about his home DM care regimen.  Has 3 CBG meters at home and checks CBGs at bedtime.  Sees Dr. Jeanie Cooks for general medical care.  Has not been rotating his injection sites for his insulin injections.  Discussed with patient the need to rotate insulin injection sites every time he gives an injection to prevent formation of scar tissue.  Patient told me no one ever taught him this and that he will definitely rotate injection spots now that he knows.  Encouraged pt to check his CBGs in the AM several times a week so that Dr. Jeanie Cooks can see his CBG data to make better insulin adjustments if needed.  Also encouraged pt to check his CBGs after meals on occasion to see if he's having any postprandial glucose issues.    Reviewed proper use of the insulin pen with patient.  Patient did know how to use an insulin pen, however, he did not know that you need to prime the pen before each injection and  hold in the skin for 10 seconds after injection is complete.  Re-reviewed verbally how to correctly use the insulin pen at home and emphasized to pt the need to both prime the pen before giving the injection and the need to hold in the skin for 10 seconds after the injection is complete.  Also encouraged pt to write down his CBGs for 1 week prior to his next MD appt with Dr. Jeanie Cooks so that Dr. Jeanie Cooks can evaluate his CBGs.      --Will follow patient during hospitalization--  Wyn Quaker RN, MSN, CDE Diabetes Coordinator Inpatient Glycemic Control Team Team Pager: [phone] (8a-5p)

## 2017-06-09 NOTE — Discharge Instructions (Signed)
Insulin Pen Instructions:  1. Remove Insulin pen cap and clean pen 1st with alcohol rub and then clean skin 2nd with alcohol rub 2. Twist insulin pen needle onto pen (right tighty) 3. Remove outer cap and inner cap from needle 4. Dial pen to 2 units and perform prime- press pen to zero and make sure liquid (insulin) comes out of the needle 5. Dial pen to your dose and perform injection into your abdomen 6. Hold needle in skin for 10 seconds after injection 7. Remove needle from insulin pen and discard 8. Place cap back on insulin pen and store safely (at room temperature) 9. Store unused pens in refrigerator and can keep opened insulin pen at room temperature (discard used pen after 30 days)   Fingerstick glucose (sugar) goals for home: Before meals: 80-130 mg/dl 2-Hours after meals: less than 180 mg/dl Hemoglobin A1c goal: 7% or less

## 2017-06-10 ENCOUNTER — Encounter (HOSPITAL_COMMUNITY): Payer: Self-pay | Admitting: Physician Assistant

## 2017-06-10 ENCOUNTER — Ambulatory Visit: Payer: PPO | Admitting: Physician Assistant

## 2017-06-10 ENCOUNTER — Other Ambulatory Visit: Payer: Self-pay

## 2017-06-10 LAB — BASIC METABOLIC PANEL
Anion gap: 5 (ref 5–15)
BUN: 30 mg/dL — ABNORMAL HIGH (ref 6–20)
CHLORIDE: 108 mmol/L (ref 101–111)
CO2: 25 mmol/L (ref 22–32)
CREATININE: 1.66 mg/dL — AB (ref 0.61–1.24)
Calcium: 8.3 mg/dL — ABNORMAL LOW (ref 8.9–10.3)
GFR calc non Af Amer: 41 mL/min — ABNORMAL LOW (ref >60)
GFR, EST AFRICAN AMERICAN: 47 mL/min — AB (ref >60)
Glucose, Bld: 129 mg/dL — ABNORMAL HIGH (ref 65–99)
Potassium: 3.9 mmol/L (ref 3.5–5.1)
SODIUM: 138 mmol/L (ref 135–145)

## 2017-06-10 LAB — CBC
HCT: 35.5 % — ABNORMAL LOW (ref 39.0–52.0)
HEMOGLOBIN: 11.4 g/dL — AB (ref 13.0–17.0)
MCH: 31.3 pg (ref 26.0–34.0)
MCHC: 32.1 g/dL (ref 30.0–36.0)
MCV: 97.5 fL (ref 78.0–100.0)
Platelets: 153 10*3/uL (ref 150–400)
RBC: 3.64 MIL/uL — AB (ref 4.22–5.81)
RDW: 12.6 % (ref 11.5–15.5)
WBC: 5.3 10*3/uL (ref 4.0–10.5)

## 2017-06-10 LAB — GLUCOSE, CAPILLARY: GLUCOSE-CAPILLARY: 126 mg/dL — AB (ref 65–99)

## 2017-06-10 NOTE — Clinical Social Work Note (Signed)
CSW acknowledges consult, "[facility] RNCM will follow up with patient.  CSW signing off.   Dayton Scrape, [place]

## 2017-06-10 NOTE — Discharge Summary (Signed)
Physician Discharge Summary  KALIQ LEGE FBP:[id number] DOB: [birth year] DOA: 06/08/2017  PCP: [name], MD  Admit date: 06/08/2017 Discharge date: 06/10/2017   Recommendations for Outpatient Follow-Up:   1. Encourage compliance with meds   Discharge Diagnosis:   Principal Problem:   Acute on chronic kidney failure (HCC) Active Problems:   Benign hypertensive heart disease without heart failure   DM (diabetes mellitus) with complications (HCC)   Troponin level elevated   Volume depletion   HTN (hypertension), benign   Acute renal injury [facility])   Discharge disposition:  Home.  Discharge Condition: Improved.  Diet recommendation: Low sodium, heart healthy.  Carbohydrate-modified  Wound care: None.   History of Present Illness:   Robert Chaney is a 66 y.o. male with a history of DM2, HTN, hypertrophic cardiomyopathy, chronic kidney disease stage 3, hyperlipidemia. 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 He was experiencing blurred vision, had difficulty forming words per coworkers.  Patient had elevated blood sugars was checked by the nursing home staff.  His blood pressure was also checked.  Both these were elevated significantly.  Patient reports that his blood sugars have been high over the past 2-3 days, despite taking his insulin.  He does report missing his blood pressure medication this morning.  Did have a brief episode of chest pain that is now resolved without medication.  No palliating or provoking factors.  Per ED staff, patient lucid and without evidence of altered mental status or confusion, just somnolent.     Hospital Course by Problem:   AKI on CKD -baseline Cr of 1.5 -IVF overnight again  DM -SSI -resume home lantus -patient was not rotating sites of injection -needs close follow up and education  HTN -resume home meds  Elevated troponin -suspect from elevated BP and CR      Medical  Consultants:    None.   Discharge Exam:    Vitals:   06/09/17 0842 06/09/17 1158 06/09/17 2114   BP: (!) 152/66 117/60 (!) 157/76   Pulse: 66 64 68   Resp:  18 18   Temp: 98.1 F (36.7 C) 98 F (36.7 C) 99.6 F (37.6 C)   TempSrc: Oral Oral Oral   SpO2: 100% 100% 96%   Weight:      Height:        Gen:  NAD   The results of significant diagnostics from this hospitalization (including imaging, microbiology, ancillary and laboratory) are listed below for reference.     Procedures and Diagnostic Studies:   Ct Head Wo Contrast  Result Date: 06/08/2017 CLINICAL DATA:  Altered mental status EXAM: CT HEAD WITHOUT CONTRAST TECHNIQUE: Contiguous axial images were obtained from the base of the skull through the vertex without intravenous contrast. COMPARISON:  None. FINDINGS: Brain: Chronic ischemic changes are present in the periventricular white matter. There is encephalomalacia in the left cerebellar hemisphere. Lacune or infarct in the left basal ganglia. Mild global atrophy. There is no mass effect, midline shift, or acute hemorrhage. Vascular: No hyperdense vessel or unexpected calcification. Skull: Cranium is intact. Sinuses/Orbits: Mild mucosal thickening throughout the left ethmoid air cells. Orbits are unremarkable. Mastoid air cells are clear. Other: Noncontributory. IMPRESSION: Chronic ischemic changes and atrophy. No acute intracranial pathology. Electronically Signed   By: Marybelle Killings M.D.   On: 06/08/2017 09:04     Labs:   Basic Metabolic Panel: Recent Labs  Lab 06/08/17 0716 06/09/17 0452 06/09/17 1548 06/10/17 0551  NA 138 140 135 138  K 3.3* 4.3 4.3 3.9  CL 102 107 104 108  CO2 27 28 28 25   GLUCOSE 333* 131* 351* 129*  BUN 41* 40* 40* 30*  CREATININE 3.07* 2.22* 2.09* 1.66*  CALCIUM 9.6 8.7* 8.2* 8.3*   GFR Estimated Creatinine Clearance: 41.2 mL/min (A) (by C-G formula based on SCr of 1.66 mg/dL (H)). Liver Function Tests: Recent Labs  Lab  06/08/17 0716  AST 21  ALT 18  ALKPHOS 100  BILITOT 0.6  PROT 7.1  ALBUMIN 3.3*   No results for input(s): LIPASE, AMYLASE in the last 168 hours. No results for input(s): AMMONIA in the last 168 hours. Coagulation profile Recent Labs  Lab 06/08/17 0716  INR 1.05    CBC: Recent Labs  Lab 06/08/17 0716 06/10/17 0551  WBC 6.4 5.3  NEUTROABS 4.7  --   HGB 11.4* 11.4*  HCT 33.8* 35.5*  MCV 95.8 97.5  PLT 170 153   Cardiac Enzymes: Recent Labs  Lab 06/08/17 1254 06/08/17 1625 06/08/17 1832  TROPONINI 0.24* 0.32* 0.31*   BNP: Invalid input(s): POCBNP CBG: Recent Labs  Lab 06/09/17 0738 06/09/17 1156 06/09/17 1652 06/09/17 2112 06/10/17 0727  GLUCAP 134* 274* 277* 247* 126*   D-Dimer No results for input(s): DDIMER in the last 72 hours. Hgb A1c No results for input(s): HGBA1C in the last 72 hours. Lipid Profile No results for input(s): CHOL, HDL, LDLCALC, TRIG, CHOLHDL, LDLDIRECT in the last 72 hours. Thyroid function studies No results for input(s): TSH, T4TOTAL, T3FREE, THYROIDAB in the last 72 hours.  Invalid input(s): FREET3 Anemia work up No results for input(s): VITAMINB12, FOLATE, FERRITIN, TIBC, IRON, RETICCTPCT in the last 72 hours. Microbiology No results found for this or any previous visit (from the past 240 hour(s)).   Discharge Instructions:   Discharge Instructions    Diet - low sodium heart healthy   Complete by:  As directed    Diet Carb Modified   Complete by:  As directed    Discharge instructions   Complete by:  As directed    BMP 1 week Bring log of blood sugars to PCP Be sure to rotate site of insulin injections Be sure to take BP medications   Increase activity slowly   Complete by:  As directed      Allergies as of 06/10/2017      Reactions   Aspirin    itching      Medication List    STOP taking these medications   hydrochlorothiazide 12.5 MG capsule Commonly known as:  MICROZIDE     TAKE these  medications   amLODipine 10 MG tablet Commonly known as:  NORVASC TAKE ONE TABLET BY MOUTH AT BEDTIME   aspirin-acetaminophen-caffeine 161-096-04 MG tablet Commonly known as:  EXCEDRIN MIGRAINE Take 1 tablet by mouth every 6 (six) hours as needed for headache.   atorvastatin 20 MG tablet Commonly known as:  LIPITOR TAKE ONE TABLET BY MOUTH ONCE DAILY   carvedilol 25 MG tablet Commonly known as:  COREG Take 1 tablet (25 mg total) 2 (two) times daily by mouth.   hydrALAZINE 50 MG tablet Commonly known as:  APRESOLINE Take 1 tablet (50 mg total) by mouth 3 (three) times daily.   isosorbide mononitrate 60 MG 24 hr tablet Commonly known as:  IMDUR Take 1 tablet (60 mg total) by mouth daily.   losartan-hydrochlorothiazide 50-12.5 MG tablet Commonly known as:  HYZAAR Take 1 tablet by mouth 2 (two) times  daily.   nitroGLYCERIN 0.4 MG SL tablet Commonly known as:  NITROSTAT Place 1 tablet (0.4 mg total) under the tongue every 5 (five) minutes as needed for chest pain.   TOUJEO SOLOSTAR 300 UNIT/ML Sopn Generic drug:  Insulin Glargine Inject 20 Units into the skin at bedtime.   Vitamin D (Ergocalciferol) 50000 units Caps capsule Commonly known as:  DRISDOL Take 50,000 Units by mouth every 7 (seven) days.      Follow-up Information    [name], MD. Go on 06/13/2017.   Specialty:  Internal Medicine Why:  needs BMP re: Cr office will call@11 :15am Contact information: [address] [place] [address] [phone]            Time coordinating discharge: 35 min  Signed:  Geradine Girt   [facility] 06/10/2017, 4:46 PM

## 2017-06-10 NOTE — Plan of Care (Signed)
  Safety: Ability to remain free from injury will improve 06/10/2017 0312 - Progressing by Anson Fret, RN Note Pt. ambulating to bathroom without assist; no problems noted.

## 2017-06-10 NOTE — Progress Notes (Signed)
CM talked to patient about questions about [facility];  Patient stated that he had recent eye surgery want needed paperwork from the [facility] ( not the [facility] hospital); CM informed patient to call the [facility] and have the paperwork faxed to the MD for him to complete for driving restrictions; [place] RN,MHA,BSN [phone]

## 2017-06-10 NOTE — Progress Notes (Deleted)
Cardiology Office Note    Date:  06/10/2017  ID:  Robert Chaney, DOB 04-30-1951, MRN [id number] PCP:  Robert Ebbs, MD  Cardiologist:  Dr. Meda Chaney   Chief Complaint: f/u BP  History of Present Illness:  Robert Chaney is a 66 y.o. male with history of hypertrophic cardiomyopathy, NSVT/PACS/PVCs/SVT, HTN, HLD, chronic diastolic CHF, DM, anemia who presents for f/u.   He has history of elevated troponin of over 1 in 2013 in setting of rhabdomyolsis, acute renal failure and HHONK. Previous echo 03/2016 showed severe LVH, EF 60-65%, somewhat speckled asymmetric septal hypetrophy, no RWMA, elevated LVEDP and LAFP, mod LAE. Cardiac MRI 10/2016 showed severe left ventricular hypertrophy with basal septal thickness of 21 mm and normal systolic function (LVEF = 62%), diffuse patchy late gadolinium enhancement in the basal anterior, anteroseptal, inferior, inferolateral, mid anterior, inferior, inferolateral and apical inferior walls, mild MR and TR, normal RV. Collectively, these findings were felt to be consistent with hypertrophic cardiomyopathy with high risk feature - presence of LGE in the 8 myocardial segments. 48-hour Holter 11/2016 showed SB to SR, infrequent PACs/PVCS, 1 run NSVT (4 beats), frequent short runs of SVT posisbly atrial tachycardia. He was referred to Dr. Caryl Chaney for consideration of ICD who felt that "at age 45, there are no risk stratification data which will be applied to him for primary prevention. Hence, clarifying his diagnosis relates to family risk stratification. There is no family history of unexplained death. We have discussed evaluation of his family using echo/ECG versus genetic testing. He would prefer the latter we will arrange consultation with Dr. Broadus Chaney [genetics counselor]." Per Dr. Delene Chaney recommendations, "In summary, Robert Chaney does not exhibit the characteristic features of a genetic disease. His relatively late age of onset in the background of hypertension further  confounds his diagnosis. Additionally, there is no family history of sudden death or HCM. It is likely that he may have a de novo mutation that would place his children at a 50% risk of inheriting his condition. However, he has no surviving children. Genetic testing for HCM may be considered to confirm his diagnosis, although the yield will be significantly reduced, at best 20%. I explained to him that the genetic test would not change his treatment or management strategies."  He was recently seen in clinic and found to have persistently high blood pressure despite reporting normal home numbers. Further complicating the fact is that the patient reported he didn't take meds prior to his visit. At f/u visit 11/20, Robert Chaney changed his metoprolol to carvedilol. F/u 24-hour ambulatory BP monitor showed a mean blood pressure of 148/73 with mean HR 63. He was admitted 12/16-12/18 with blurry vision and difficulty forming words. He had missed his BP medication that morning. Upon arrival to the ED, he was somnolent. He was found to have AKI on CKD with BUN 41, Cr 3.07. His HCTZ was stopped. Blood pressure appeared labile at 106/41 yesterday in the middle of the night, to 171/80 on day of discharge. Labs otherwise notable for Hgb 11.4 c/w prior and troponin peak of 0.32 felt due to elevated BP and creatinine. CT head nonacute. He was not evaluated by neurology during admission.  aki on ckd  Hypertrophic cardiomyopathy Elevated troponin Chronic diastolic CHF Essential HTN    Past Medical History:  Diagnosis Date  . Anemia   . Diabetes mellitus   . Diastolic CHF, chronic (HCC)    A.  03/2009 Echo: EF 60-65%, Gr II diast dysfxn  .  Elevated troponin   . Glaucoma   . Hyperlipidemia    HYPERCHOLESTEROLEMIA  . Hypertension    MARKED LEFT VENTRICULAR HYPERTROPHY BY PREVIOUS ECHOCARDIOGRAM--HE HAS HYPERDYNAMIC LEFT VENTRICULAR SYSTOLIC FUNCTION AND HAS IMPAIRED RELAXATION BY ECHO  . Hypertrophic  cardiomyopathy ([place])   . Murmur    A.  Mild AI by 10/10 echo  . NSVT (nonsustained ventricular tachycardia) ([place])   . Premature atrial contractions   . PVC's (premature ventricular contractions)   . SVT (supraventricular tachycardia) (HCC)     Past Surgical History:  Procedure Laterality Date  . NO PAST SURGERIES      Current Medications: No outpatient medications have been marked as taking for the 06/10/17 encounter (Appointment) with Robert Pitter, PA-C.     Allergies:   Aspirin   Social History   Socioeconomic History  . Marital status: Legally Separated    Spouse name: Not on file  . Number of children: Not on file  . Years of education: Not on file  . Highest education level: Not on file  Social Needs  . Financial resource strain: Not on file  . Food insecurity - worry: Not on file  . Food insecurity - inability: Not on file  . Transportation needs - medical: Not on file  . Transportation needs - non-medical: Not on file  Occupational History  . Occupation: Forensic psychologist: [employer]    Comment: At [employer]  . Smoking status: Never Smoker  . Smokeless tobacco: Never Used  Substance and Sexual Activity  . Alcohol use: No  . Drug use: No  . Sexual activity: No  Other Topics Concern  . Not on file  Social History Narrative   Originally from [place].      Family History:  Family History  Problem Relation Age of Onset  . Hypertension Father   . Diabetes Brother   . Diabetes Brother    ***  ROS:   Please see the history of present illness. Otherwise, review of systems is positive for ***.  All other systems are reviewed and otherwise negative.    PHYSICAL EXAM:   VS:  There were no vitals taken for this visit.  BMI: There is no height or weight on file to calculate BMI. GEN: Well nourished, well developed, in no acute distress  HEENT: normocephalic, atraumatic Neck: no JVD, carotid bruits, or masses Cardiac: ***RRR;  no murmurs, rubs, or gallops, no edema  Respiratory:  clear to auscultation bilaterally, normal work of breathing GI: soft, nontender, nondistended, + BS MS: no deformity or atrophy  Skin: warm and dry, no rash Neuro:  Alert and Oriented x 3, Strength and sensation are intact, follows commands Psych: euthymic mood, full affect  Wt Readings from Last 3 Encounters:  06/10/17 179 lb 3.2 oz (81.3 kg)  05/13/17 184 lb (83.5 kg)  05/08/17 184 lb 12.8 oz (83.8 kg)      Studies/Labs Reviewed:   EKG:  EKG was ordered today and personally reviewed by me and demonstrates *** EKG was not ordered today.***  Recent Labs: 11/07/2016: TSH 3.230 06/08/2017: ALT 18; B Natriuretic Peptide 223.0 06/10/2017: BUN 30; Creatinine, Ser 1.66; Hemoglobin 11.4; Platelets 153; Potassium 3.9; Sodium 138   Lipid Panel    Component Value Date/Time   CHOL 157 11/07/2016 0922   TRIG 81 11/07/2016 0922   HDL 59 11/07/2016 0922   CHOLHDL 2.7 11/07/2016 0922   CHOLHDL 3 05/04/2014 0852   VLDL 13.6  05/04/2014 0852   [place] 82 11/07/2016 0922    Additional studies/ records that were reviewed today include: Summarized above.***    ASSESSMENT & PLAN:   1. ***  Disposition: F/u with ***   Medication Adjustments/Labs and Tests Ordered: Current medicines are reviewed at length with the patient today.  Concerns regarding medicines are outlined above. Medication changes, Labs and Tests ordered today are summarized above and listed in the Patient Instructions accessible in Encounters.   Signed, Robert Pitter, PA-C  06/10/2017 11:27 AM    [address], [place], [place]  [address] Phone: [phone]; Fax: [phone]

## 2017-06-13 DIAGNOSIS — I10 Essential (primary) hypertension: Secondary | ICD-10-CM | POA: Diagnosis not present

## 2017-06-13 DIAGNOSIS — N183 Chronic kidney disease, stage 3 (moderate): Secondary | ICD-10-CM | POA: Diagnosis not present

## 2017-06-13 DIAGNOSIS — I509 Heart failure, unspecified: Secondary | ICD-10-CM | POA: Diagnosis not present

## 2017-06-13 DIAGNOSIS — E1165 Type 2 diabetes mellitus with hyperglycemia: Secondary | ICD-10-CM | POA: Diagnosis not present

## 2017-06-27 DIAGNOSIS — E1165 Type 2 diabetes mellitus with hyperglycemia: Secondary | ICD-10-CM | POA: Diagnosis not present

## 2017-06-27 DIAGNOSIS — I509 Heart failure, unspecified: Secondary | ICD-10-CM | POA: Diagnosis not present

## 2017-06-27 DIAGNOSIS — I10 Essential (primary) hypertension: Secondary | ICD-10-CM | POA: Diagnosis not present

## 2017-07-01 ENCOUNTER — Encounter (INDEPENDENT_AMBULATORY_CARE_PROVIDER_SITE_OTHER): Payer: Self-pay

## 2017-07-01 ENCOUNTER — Ambulatory Visit: Payer: PPO | Admitting: Physician Assistant

## 2017-07-01 ENCOUNTER — Encounter: Payer: Self-pay | Admitting: Physician Assistant

## 2017-07-01 VITALS — BP 166/70 | HR 75 | Ht 68.0 in | Wt 183.0 lb

## 2017-07-01 DIAGNOSIS — R4182 Altered mental status, unspecified: Secondary | ICD-10-CM

## 2017-07-01 DIAGNOSIS — R748 Abnormal levels of other serum enzymes: Secondary | ICD-10-CM

## 2017-07-01 DIAGNOSIS — I472 Ventricular tachycardia: Secondary | ICD-10-CM | POA: Diagnosis not present

## 2017-07-01 DIAGNOSIS — R778 Other specified abnormalities of plasma proteins: Secondary | ICD-10-CM

## 2017-07-01 DIAGNOSIS — I471 Supraventricular tachycardia: Secondary | ICD-10-CM

## 2017-07-01 DIAGNOSIS — I422 Other hypertrophic cardiomyopathy: Secondary | ICD-10-CM

## 2017-07-01 DIAGNOSIS — R7989 Other specified abnormal findings of blood chemistry: Secondary | ICD-10-CM

## 2017-07-01 DIAGNOSIS — I4729 Other ventricular tachycardia: Secondary | ICD-10-CM

## 2017-07-01 DIAGNOSIS — I10 Essential (primary) hypertension: Secondary | ICD-10-CM | POA: Diagnosis not present

## 2017-07-01 NOTE — Patient Instructions (Signed)
Medication Instructions:  Your physician recommends that you continue on your current medications as directed. Please refer to the Current Medication list given to you today.   Labwork: TODAY:  BMET  Testing/Procedures: None ordered  Follow-Up: Your physician recommends that you schedule a follow-up appointment in: [place] DR. Meda Coffee, DAYNA DUNN, PA-C, OR BRITTANY SIMMONS, PA-C   Any Other Special Instructions Will Be Listed Below (If Applicable).     If you need a refill on your cardiac medications before your next appointment, please call your pharmacy.

## 2017-07-01 NOTE — Progress Notes (Signed)
Cardiology Office Note    Date:  07/01/2017  ID:  Robert Chaney, DOB [birth year], MRN [id number] PCP:  Robert Ebbs, MD  Cardiologist:  Dr. Meda Chaney   Chief Complaint: f/u blood pressure  History of Present Illness:  Robert Chaney is a 67 y.o. male with history of hypertrophic cardiomyopathy, NSVT/PACS/PVCs/SVT, probable CKD stage III, HTN, HLD, chronic diastolic CHF, DM, anemia who presents for f/u of blood pressure.   To recap history, he was seen by cardiology in 2013 for elevated troponin peak of 1.4 in setting of hyperosmolar state and AKI. This was felt to be a demand process. He's not had prior ischemic testing. Last echo 03/2016 showed severe LVH, EF 60-65%, somewhat speckled asymmetric septal hypetrophy, no RWMA, elevated LVEDP and LAFP, mod LAE. Cardiac MRI 10/2016 showed severe left ventricular hypertrophy with basal septal thickness of 21 mm and normal systolic function (LVEF = 62%), diffuse patchy late gadolinium enhancement in the basal anterior, anteroseptal, inferior, inferolateral, mid anterior, inferior, inferolateral and apical inferior walls, mild MR and TR, normal RV. Collectively, these findings were felt consistent with hypertrophic cardiomyopathy with high risk feature - presence of LGE in the 8 myocardial segments. 48-hour Holter 11/2016 showed SB to SR, infrequent PACs/PVCS, 1 run NSVT (4 beats), frequent short runs of SVT possibly atrial tachycardia. He was referred to Dr. Caryl Chaney for consideration of ICD who felt that "at age 67, there are no risk stratification data which will be applied to him for primary prevention. Hence, clarifying his diagnosis relates to family risk stratification. There is no family history of unexplained death. We have discussed evaluation of his family using echo/ECG versus genetic testing." He saw geneticist Dr. Broadus Chaney who reviewed his family history felt that genetic testing would not change treatment or management strategies. The patient has no family  history of SCD. He has no living children (died at age 74 and 55, unknown mechanism).  When seen in routine follow-up November 2018, his BP was running high but he had not yet taken his medications. He was asked to return after making sure to take them. BP remained high so Robert Chaney changed metoprolol to carvedilol and placed a 24-hour BP monitor with average BP 148/73. 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 He was somnolent upon arrival to the ED. Workup revealed AKI on CKD (baseline CR around 1.4-1.5, peaked at 3.07) and hyperglycemia (reported CBG 599 per patient) despite taking insulin. He was not rotating his sites of injection which was felt to be contributing. Otherwise labs showed Hgb 11.4, troponin of 0.32 with relatively flat trend. UDS negative, UA with >500 glucose and 100 protein. Troponin elevation was felt due to high BP and elevated creatinine. Although somewhat confusing, HCTZ was stopped and losartan/HCTZ was added. Last TSH 10/2016 wnl.  He returns for followup. His blood pressure is again high, consistent with several prior clinic values. He reports compliance with medicines except yesterday began to "split them up" taking them at different times of the day because he feels better when he does this. He is not sure which ones he takes when, but assures me he's been fully compliant. 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 He reports that when he first started taking the losartan/HCTZ it was a "magic pill" and even dropped his blood pressure too much into the 80s, then 109 the  first week he took it. He says since that time his blood pressure has "gone back to his normal" in the 140s-150s. He reports he is feeling markedly better and has not had any recurrent AMS episodes. He adamantly denies any CP, SOB, dizziness, diaphoresis. He continues to play tennis  regularly.   Past Medical History:  Diagnosis Date  . Anemia   . CKD (chronic kidney disease), stage III ([place])   . Diabetes mellitus   . Diastolic CHF, chronic ([facility])    A.  03/2009 Echo: EF 60-65%, Gr II diast dysfxn  . Elevated troponin   . Glaucoma   . Hyperlipidemia    HYPERCHOLESTEROLEMIA  . Hypertension    MARKED LEFT VENTRICULAR HYPERTROPHY BY PREVIOUS ECHOCARDIOGRAM--HE HAS HYPERDYNAMIC LEFT VENTRICULAR SYSTOLIC FUNCTION AND HAS IMPAIRED RELAXATION BY ECHO  . Hypertrophic cardiomyopathy ([place])   . NSVT (nonsustained ventricular tachycardia) ([place])   . Premature atrial contractions   . PVC's (premature ventricular contractions)   . SVT (supraventricular tachycardia) ([facility])     Past Surgical History:  Procedure Laterality Date  . NO PAST SURGERIES      Current Medications: Current Meds  Medication Sig  . amLODipine (NORVASC) 10 MG tablet TAKE ONE TABLET BY MOUTH AT BEDTIME  . aspirin-acetaminophen-caffeine (EXCEDRIN MIGRAINE) 250-250-65 MG tablet Take 1 tablet by mouth every 6 (six) hours as needed for headache.  Marland Kitchen atorvastatin (LIPITOR) 20 MG tablet TAKE ONE TABLET BY MOUTH ONCE DAILY  . carvedilol (COREG) 25 MG tablet Take 1 tablet (25 mg total) 2 (two) times daily by mouth.  . hydrALAZINE (APRESOLINE) 50 MG tablet Take 1 tablet (50 mg total) by mouth 3 (three) times daily.  . Insulin Glargine (TOUJEO SOLOSTAR) 300 UNIT/ML SOPN Inject 20 Units into the skin at bedtime.   . isosorbide mononitrate (IMDUR) 60 MG 24 hr tablet Take 1 tablet (60 mg total) by mouth daily.  Marland Kitchen losartan-hydrochlorothiazide (HYZAAR) 50-12.5 MG tablet Take 1 tablet by mouth 2 (two) times daily.  . nitroGLYCERIN (NITROSTAT) 0.4 MG SL tablet Place 1 tablet (0.4 mg total) under the tongue every 5 (five) minutes as needed for chest pain.  . Vitamin D, Ergocalciferol, (DRISDOL) 50000 units CAPS capsule Take 50,000 Units by mouth every 7 (seven) days.    Allergies:   Aspirin   Social History    Socioeconomic History  . Marital status: Legally Separated    Spouse name: None  . Number of children: None  . Years of education: None  . Highest education level: None  Social Needs  . Financial resource strain: None  . Food insecurity - worry: None  . Food insecurity - inability: None  . Transportation needs - medical: None  . Transportation needs - non-medical: None  Occupational History  . Occupation: Forensic psychologist: [employer]    Comment: At [employer]  . Smoking status: Never Smoker  . Smokeless tobacco: Never Used  Substance and Sexual Activity  . Alcohol use: No  . Drug use: No  . Sexual activity: No  Other Topics Concern  . None  Social History Narrative   Originally from [place].      Family History:  Family History  Problem Relation Age of Onset  . Hypertension Father   . Diabetes Brother   . Diabetes Brother     ROS:   Please see the history of present illness.  All other systems are reviewed and otherwise negative.    PHYSICAL EXAM:  VS:  BP (!) 166/70   Pulse 75   Ht 5\' 8"  (1.727 m)   Wt 183 lb (83 kg)   SpO2 95%   BMI 27.83 kg/m   BMI: Body mass index is 27.83 kg/m. GEN: Well nourished, well developed AAM, in no acute distress  HEENT: normocephalic, atraumatic, chronic appearing icteric sclera Neck: no JVD, carotid bruits, or masses Cardiac: RRR; no murmurs, rubs, or gallops, no edema  Respiratory:  clear to auscultation bilaterally, normal work of breathing GI: soft, nontender, nondistended, + BS MS: no deformity or atrophy  Skin: warm and dry, no rash Neuro:  Alert and Oriented x 3, Strength and sensation are intact, follows commands Psych: euthymic mood, full affect  Wt Readings from Last 3 Encounters:  07/01/17 183 lb (83 kg)  06/10/17 179 lb 3.2 oz (81.3 kg)  05/13/17 184 lb (83.5 kg)      Studies/Labs Reviewed:   EKG:  EKG was ordered today and personally reviewed by me and demonstrates NSR  75bpm, nonspecific diffuse TWI and LVH  Recent Labs: 11/07/2016: TSH 3.230 06/08/2017: ALT 18; B Natriuretic Peptide 223.0 06/10/2017: BUN 30; Creatinine, Ser 1.66; Hemoglobin 11.4; Platelets 153; Potassium 3.9; Sodium 138   Lipid Panel    Component Value Date/Time   CHOL 157 11/07/2016 0922   TRIG 81 11/07/2016 0922   HDL 59 11/07/2016 0922   CHOLHDL 2.7 11/07/2016 0922   CHOLHDL 3 05/04/2014 0852   VLDL 13.6 05/04/2014 0852   LDLCALC 82 11/07/2016 0922    Additional studies/ records that were reviewed today include: Summarized above.    ASSESSMENT & PLAN:   1. HTN - in recent office visits, the patient has reassured [place] that his blood pressure is normal at home but recent home BP monitor argues against this. He says his blood pressure is high today because he's been splitting up his medication doses to take at different times. He expresses concern that when he first started taking the losartan/HCTZ his blood pressure was extremely low. He says most recent values have been in the 140-150 range at home. He is unable to tell me what he has taken today. This has been an issue in prior visits so I remain concerned about true compliance. He denies any barriers to care including medication expense. I will recheck BMET today given ARB initation. Consideration will be given to ordering a renal artery duplex to exclude renal artery stenosis. Sleep study was also ordered at last visit but do not see this scheduled yet. Will discuss with nurse to make sure this is scheduled. I plan to review his chart with Dr. Meda Chaney. 2. Recent elevated troponin - he has history of this in 2013 as well, under similar circumstances. This was felt to be a demand process each encounter. I do not see ischemic testing ever pursued. Given his DM and poorly controlled HTN, he is certainly at risk for ischemic heart disease. He is not currently on aspirin due to aspirin allergy. I pointed out that he should not take Excedrin  (listed as PRN on list) due to containing ASA. Of note, he is also only on atorvastatin 20mg  despite his diabetes. Prior notes indicate to continue atorvastatin 40mg  daily.  I will discuss further evaluation with Dr. Meda Chaney.  3. Hypertrophic cardiomyopathy - suspect historically due to poorly controlled BP. As above, not previously felt to require defibrillator or genetic testing. Mainstay of therapy will be blood pressure management. 4. Altered mental status - etiology felt due to  marked hyperglycemia. I cannot be sure this episode did not represent TIA, but he is reported to be allergic to aspirin. As above, will review with MD. 5. History of NSVT/SVT - no known family history of SCD. No history of syncope. Per Dr. Caryl Chaney, he did not feel there was an indication for ICD. Continue to monitor.  Disposition: F/u with Dr. Rudi Coco team APP in 4 weeks.   Medication Adjustments/Labs and Tests Ordered: Current medicines are reviewed at length with the patient today.  Concerns regarding medicines are outlined above. Medication changes, Labs and Tests ordered today are summarized above and listed in the Patient Instructions accessible in Encounters.   Signed, Charlie Pitter, PA-C  07/01/2017 3:38 PM    [facility] [place], [place], [place]  [address] Phone: [phone]; Fax: [phone]

## 2017-07-02 LAB — BASIC METABOLIC PANEL
BUN/Creatinine Ratio: 18 (ref 10–24)
BUN: 28 mg/dL — ABNORMAL HIGH (ref 8–27)
CALCIUM: 9 mg/dL (ref 8.6–10.2)
CHLORIDE: 102 mmol/L (ref 96–106)
CO2: 23 mmol/L (ref 20–29)
Creatinine, Ser: 1.55 mg/dL — ABNORMAL HIGH (ref 0.76–1.27)
GFR calc Af Amer: 52 mL/min/{1.73_m2} — ABNORMAL LOW (ref >59)
GFR, EST NON AFRICAN AMERICAN: 45 mL/min/{1.73_m2} — AB (ref >59)
Glucose: 307 mg/dL — ABNORMAL HIGH (ref 65–99)
POTASSIUM: 4.7 mmol/L (ref 3.5–5.2)
Sodium: 139 mmol/L (ref 134–144)

## 2017-07-03 DIAGNOSIS — H34831 Tributary (branch) retinal vein occlusion, right eye, with macular edema: Secondary | ICD-10-CM | POA: Diagnosis not present

## 2017-07-09 NOTE — Telephone Encounter (Signed)
Informed patient of upcoming sleep study and patient understanding was verbalized. Patient understands her sleep study is scheduled for Friday July 25 2017. Patient understands her sleep study will be done at [facility] sleep lab. Patient understands she will receive a sleep packet in a week or so. Patient understands to call if she does not receive the sleep packet in a timely manner. Patient agrees with treatment and thanked me for call.

## 2017-07-14 ENCOUNTER — Telehealth: Payer: Self-pay | Admitting: Physician Assistant

## 2017-07-14 DIAGNOSIS — R7989 Other specified abnormal findings of blood chemistry: Principal | ICD-10-CM

## 2017-07-14 DIAGNOSIS — R778 Other specified abnormalities of plasma proteins: Secondary | ICD-10-CM

## 2017-07-14 MED ORDER — HYDRALAZINE HCL 50 MG PO TABS: 75.0000 mg | ORAL_TABLET | Freq: Three times a day (TID) | ORAL | 3 refills | Status: DC

## 2017-07-14 NOTE — Addendum Note (Signed)
Addended by: Gaetano Net on: 07/14/2017 02:21 PM   Modules accepted: Orders

## 2017-07-14 NOTE — Telephone Encounter (Signed)
I have not yet heard back from Dr. Meda Coffee, but for now, recommend the following plan given recent events with high BP and elevated troponin: - increase hydralazine to 75mg  TID - please arrange Lexiscan nuclear stress test to evaluate for ischemia (Lexiscan chosen instead of exercise because if we hold his medicines in prep for a stress test, his blood pressure will likely be too high to proceed with exercise).  Will plan to review results with Dr. Meda Coffee. Please make sure his follow-up occurs on a day when Dr. Meda Coffee is in clnic.  Rylee Nuzum PA-C

## 2017-07-14 NOTE — Telephone Encounter (Signed)
Called pt, per Melina Copa, PA-C, re: ordering stress test. Left a message for pt to call back.

## 2017-07-14 NOTE — Telephone Encounter (Signed)
Called pt to let him know that we are ordering a Lexiscan for him and he will pick up the instruction sheet from the front desk. Pt also aware to increase the Hydralazine to 75 mg TID.  Pt thanked me for the call.

## 2017-07-22 ENCOUNTER — Telehealth (HOSPITAL_COMMUNITY): Payer: Self-pay | Admitting: *Deleted

## 2017-07-22 NOTE — Telephone Encounter (Signed)
Left message on voicemail per DPR in reference to upcoming appointment scheduled on 07/25/17 at [facility] with detailed instructions given per Myocardial Perfusion Study Information Sheet for the test. LM to arrive 15 minutes early, and that it is imperative to arrive on time for appointment to keep from having the test rescheduled. If you need to cancel or reschedule your appointment, please call the office within 24 hours of your appointment. Failure to do so may result in a cancellation of your appointment, and a $50 no show fee. Phone number given for call back for any questions.

## 2017-07-25 ENCOUNTER — Ambulatory Visit (HOSPITAL_COMMUNITY): Payer: PPO | Attending: Internal Medicine

## 2017-07-25 ENCOUNTER — Ambulatory Visit (HOSPITAL_BASED_OUTPATIENT_CLINIC_OR_DEPARTMENT_OTHER): Payer: PPO | Attending: Physician Assistant

## 2017-07-25 VITALS — Ht 68.0 in | Wt 183.0 lb

## 2017-07-25 DIAGNOSIS — R748 Abnormal levels of other serum enzymes: Secondary | ICD-10-CM

## 2017-07-25 DIAGNOSIS — G4736 Sleep related hypoventilation in conditions classified elsewhere: Secondary | ICD-10-CM | POA: Diagnosis not present

## 2017-07-25 DIAGNOSIS — R7989 Other specified abnormal findings of blood chemistry: Secondary | ICD-10-CM

## 2017-07-25 DIAGNOSIS — G4733 Obstructive sleep apnea (adult) (pediatric): Secondary | ICD-10-CM | POA: Insufficient documentation

## 2017-07-25 DIAGNOSIS — R0683 Snoring: Secondary | ICD-10-CM

## 2017-07-25 DIAGNOSIS — I10 Essential (primary) hypertension: Secondary | ICD-10-CM | POA: Diagnosis not present

## 2017-07-25 DIAGNOSIS — R778 Other specified abnormalities of plasma proteins: Secondary | ICD-10-CM

## 2017-07-25 LAB — MYOCARDIAL PERFUSION IMAGING
CHL CUP NUCLEAR SDS: 3
CSEPPHR: 83 {beats}/min
LV dias vol: 119 mL (ref 62–150)
LVSYSVOL: 50 mL
RATE: 0.23
Rest HR: 71 {beats}/min
SRS: 3
SSS: 6
TID: 1.06

## 2017-07-25 MED ORDER — TECHNETIUM TC 99M TETROFOSMIN IV KIT
10.2000 | PACK | Freq: Once | INTRAVENOUS | Status: AC | PRN
  Administered 2017-07-25: 10.2 via INTRAVENOUS
  Filled 2017-07-25: qty 11

## 2017-07-25 MED ORDER — TECHNETIUM TC 99M TETROFOSMIN IV KIT
31.3000 | PACK | Freq: Once | INTRAVENOUS | Status: AC | PRN
  Administered 2017-07-25: 31.3 via INTRAVENOUS
  Filled 2017-07-25: qty 32

## 2017-07-25 MED ORDER — REGADENOSON 0.4 MG/5ML IV SOLN
0.4000 mg | Freq: Once | INTRAVENOUS | Status: AC
  Administered 2017-07-25: 0.4 mg via INTRAVENOUS

## 2017-07-27 NOTE — Procedures (Unsigned)
 NAME: Robert Chaney DATE OF BIRTH:  06/02/1951 MEDICAL RECORD:  [id number]  LOCATION: [facility]  PHYSICIAN: Aundrey Elahi  DATE OF STUDY: 07/25/2017  SLEEP STUDY TYPE: Positive Airway Pressure Titration               REFERRING PHYSICIAN: Dunn, Dayna N, PA-C   Gender: Male D.O.B: 09/19/1950 Age (years): 66 Referring Provider: Dayna Dunn Height (inches): 68 Interpreting Physician: Brightyn Mozer MD, ABSM Weight (lbs): 183 RPSGT: Dubili, Fred BMI: 28 MRN: [id number] Neck Size: 16.00  CLINICAL INFORMATION Sleep Study Type: Split Night CPAP  Indication for sleep study: Hypertension, OSA, Snoring, Witnessed Apneas  Epworth Sleepiness Score: 18  SLEEP STUDY TECHNIQUE As per the AASM Manual for the Scoring of Sleep and Associated Events v2.3 (April 2016) with a hypopnea requiring 4% desaturations.  The channels recorded and monitored were frontal, central and occipital EEG, electrooculogram (EOG), submentalis EMG (chin), nasal and oral airflow, thoracic and abdominal wall motion, anterior tibialis EMG, snore microphone, electrocardiogram, and pulse oximetry. Continuous positive airway pressure (CPAP) was initiated when the patient met split night criteria and was titrated according to treat sleep-disordered breathing.  MEDICATIONS Medications self-administered by patient taken the night of the study : N/A  RESPIRATORY PARAMETERS Diagnostic Total AHI (/hr): 70.3  RDI (/hr):73.3  OA Index (/hr): 40.8  CA Index (/hr): 0.0 REM AHI (/hr): N/A  NREM AHI (/hr):70.3  Supine AHI (/hr):81.2  Non-supine AHI (/hr):15.33 Min O2 Sat (%):74.00  Mean O2 (%): 89.11  Time below 88% (min):58.5   Titration Optimal Pressure (cm):11  AHI at Optimal Pressure (/hr):0.0  Min O2 at Optimal Pressure (%):94.0 Supine % at Optimal (%):0  Sleep % at Optimal (%):99   SLEEP ARCHITECTURE The recording time for the entire night was 381.0 minutes.  During a baseline period  of 154.8 minutes, the patient slept for 142.5 minutes in REM and nonREM, yielding a sleep efficiency of 92.1%. Sleep onset after lights out was 0.1 minutes with a REM latency of N/A minutes. The patient spent 1.05% of the night in stage N1 sleep, 98.95% in stage N2 sleep, 0.00% in stage N3 and 0.00% in REM.  During the titration period of 221.0 minutes, the patient slept for 207.3 minutes in REM and nonREM, yielding a sleep efficiency of 93.8%. Sleep onset after CPAP initiation was 0.2 minutes with a REM latency of 112.0 minutes. The patient spent 2.17% of the night in stage N1 sleep, 84.81% in stage N2 sleep, 0.00% in stage N3 and 13.02% in REM.  CARDIAC DATA The 2 lead EKG demonstrated sinus rhythm. The mean heart rate was 64.21 beats per minute. Other EKG findings include: None.  LEG MOVEMENT DATA The total Periodic Limb Movements of Sleep (PLMS) were 0. The PLMS index was 0.00 .  IMPRESSIONS - Severe obstructive sleep apnea occurred during the diagnostic portion of the study (AHI = 70.3/hour). An optimal PAP pressure was selected for this patient ( 11 cm of water) - No significant central sleep apnea occurred during the diagnostic portion of the study (CAI = 0.0/hour). - Moderate oxygen desaturation was noted during the diagnostic portion of the study (Min O2 =74.00%). - No snoring was audible during the diagnostic portion of the study. - No cardiac abnormalities were noted during this study. - Clinically significant periodic limb movements did not occur during sleep.  DIAGNOSIS - Obstructive Sleep Apnea (327.23 [G47.33 ICD-10]) - Nocturnal Hypoxemia  RECOMMENDATIONS - Trial of CPAP therapy on 11 cm H2O   with a Large size Resmed Full Face Mask AirFit F20 mask and heated humidification. - Avoid alcohol, sedatives and other CNS depressants that may worsen sleep apnea and disrupt normal sleep architecture. - Sleep hygiene should be reviewed to assess factors that may improve sleep  quality. - Weight management and regular exercise should be initiated or continued. - Return to Sleep Center for re-evaluation after 10 weeks of therapy  Nevin Kozuch Diplomate, American Board of Sleep Medicine  ELECTRONICALLY SIGNED ON:  07/27/2017, 9:03 PM [facility] PH: [phone]   FX: [phone] ACCREDITED BY THE AMERICAN ACADEMY OF SLEEP MEDICINE 

## 2017-07-27 NOTE — Progress Notes (Deleted)
NAME: Robert Chaney DATE OF BIRTH:  30-Jan-1951 MEDICAL RECORD NUMBER [id number]  LOCATION: [facility]  PHYSICIAN: Baylyn Sickles  DATE OF STUDY: 07/25/2017  SLEEP STUDY TYPE: Positive Airway Pressure Titration               REFERRING PHYSICIAN: Charlie Pitter, PA-C   Gender: Male D.O.B: 07-28-1950 Age (years): 72 Referring Provider: Melina Copa Height (inches): 48 Interpreting Physician: Fransico Him MD, ABSM Weight (lbs): 183 RPSGT: Baxter Flattery BMI: 28 MRN: [id number] Neck Size: 16.00  CLINICAL INFORMATION Sleep Study Type: Split Night CPAP  Indication for sleep study: Hypertension, OSA, Snoring, Witnessed Apneas  Epworth Sleepiness Score: 18  SLEEP STUDY TECHNIQUE As per the AASM Manual for the Scoring of Sleep and Associated Events v2.3 (April 2016) with a hypopnea requiring 4% desaturations.  The channels recorded and monitored were frontal, central and occipital EEG, electrooculogram (EOG), submentalis EMG (chin), nasal and oral airflow, thoracic and abdominal wall motion, anterior tibialis EMG, snore microphone, electrocardiogram, and pulse oximetry. Continuous positive airway pressure (CPAP) was initiated when the patient met split night criteria and was titrated according to treat sleep-disordered breathing.  MEDICATIONS Medications self-administered by patient taken the night of the study : N/A  RESPIRATORY PARAMETERS Diagnostic Total AHI (/hr): 70.3  RDI (/hr):73.3  OA Index (/hr): 40.8  CA Index (/hr): 0.0 REM AHI (/hr): N/A  NREM AHI (/hr):70.3  Supine AHI (/hr):81.2  Non-supine AHI (/hr):15.33 Min O2 Sat (%):74.00  Mean O2 (%): 89.11  Time below 88% (min):58.5   Titration Optimal Pressure (cm):11  AHI at Optimal Pressure (/hr):0.0  Min O2 at Optimal Pressure (%):94.0 Supine % at Optimal (%):0  Sleep % at Optimal (%):99   SLEEP ARCHITECTURE The recording time for the entire night was 381.0 minutes.  During a baseline period  of 154.8 minutes, the patient slept for 142.5 minutes in REM and nonREM, yielding a sleep efficiency of 92.1%. Sleep onset after lights out was 0.1 minutes with a REM latency of N/A minutes. The patient spent 1.05% of the night in stage N1 sleep, 98.95% in stage N2 sleep, 0.00% in stage N3 and 0.00% in REM.  During the titration period of 221.0 minutes, the patient slept for 207.3 minutes in REM and nonREM, yielding a sleep efficiency of 93.8%. Sleep onset after CPAP initiation was 0.2 minutes with a REM latency of 112.0 minutes. The patient spent 2.17% of the night in stage N1 sleep, 84.81% in stage N2 sleep, 0.00% in stage N3 and 13.02% in REM.  CARDIAC DATA The 2 lead EKG demonstrated sinus rhythm. The mean heart rate was 64.21 beats per minute. Other EKG findings include: None.  LEG MOVEMENT DATA The total Periodic Limb Movements of Sleep (PLMS) were 0. The PLMS index was 0.00 .  IMPRESSIONS - Severe obstructive sleep apnea occurred during the diagnostic portion of the study (AHI = 70.3/hour). An optimal PAP pressure was selected for this patient ( 11 cm of water) - No significant central sleep apnea occurred during the diagnostic portion of the study (CAI = 0.0/hour). - Moderate oxygen desaturation was noted during the diagnostic portion of the study (Min O2 =74.00%). - No snoring was audible during the diagnostic portion of the study. - No cardiac abnormalities were noted during this study. - Clinically significant periodic limb movements did not occur during sleep.  DIAGNOSIS - Obstructive Sleep Apnea (327.23 [G47.33 ICD-10]) - Nocturnal Hypoxemia  RECOMMENDATIONS - Trial of CPAP therapy on 11 cm H2O  with a Large size Resmed Full Face Mask AirFit F20 mask and heated humidification. - Avoid alcohol, sedatives and other CNS depressants that may worsen sleep apnea and disrupt normal sleep architecture. - Sleep hygiene should be reviewed to assess factors that may improve sleep  quality. - Weight management and regular exercise should be initiated or continued. - Return to Sleep Center for re-evaluation after 10 weeks of therapy  [place], [facility]  ELECTRONICALLY SIGNED ON:  07/27/2017, 9:03 PM [place] PH: [phone]   FX: [phone] [place]

## 2017-07-29 ENCOUNTER — Encounter: Payer: Self-pay | Admitting: Physician Assistant

## 2017-07-29 NOTE — Progress Notes (Signed)
Cardiology Office Note    Date:  07/31/2017  ID:  Robert Chaney, DOB Jul 17, 1950, MRN [id number] PCP:  [name], MD  Cardiologist:  Dr. Meda Chaney   Chief Complaint: f/u BP and stress testing -> reports dizziness this AM (blood sugar 547)  History of Present Illness:  Robert Chaney is a 67 y.o. male with history of hypertrophic cardiomyopathy, NSVT/PACS/PVCs/SVT, CKD stage III by labs, HTN, HLD, chronic diastolic CHF, DM, anemia who presents for f/u of blood pressure and testing.  To recap history, he was seen by cardiology in 2013 for elevated troponin peak of 1.4 in setting of hyperosmolar hyperglycemic state and AKI. This was felt to be a demand process. He did not have ischemic testing at that time. Echo in 03/2016 showed severe LVH, EF 60-65%, somewhat speckled asymmetric septal hypetrophy, no RWMA, elevated LVEDP and LAFP, mod LAE. Cardiac MRI 10/2016 showed severe left ventricular hypertrophy with basal septal thickness of 21 mm and normal systolic function (LVEF = 62%), diffuse patchy late gadolinium enhancement in the basal anterior, anteroseptal, inferior, inferolateral, mid anterior, inferior, inferolateral and apical inferior walls, mild MR and TR, normal RV. Collectively, these findings were felt consistent with hypertrophic cardiomyopathy with high risk feature - presence of LGE in the 8 myocardial segments. 48-hour Holter 11/2016 showed SB to SR, infrequent PACs/PVCS, 1 run NSVT (4 beats), frequent short runs of SVT possibly atrial tachycardia. He was referred to Dr. Caryl Chaney for consideration of ICD who felt that "at age 70, there are no risk stratification data which will be applied to him for primary prevention. Hence, clarifying his diagnosis relates to family risk stratification. There is no family history of unexplained death. We have discussed evaluation of his family using echo/ECG versus genetic testing." The patient saw geneticist Dr. Broadus Chaney who reviewed his family history and felt  that genetic testing would not change treatment or management strategies. The patient has no family history of SCD and has no living children (died at age 26 and 104, unknown mechanism).  His more recent visits have included blood pressure management, made challenging by the patient either not taking his regimen the day of the visit or not being able to tell us which ones he took. 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 When seen in routine follow-up November 2018, his BP was running high but he had not yet taken his medications. He was asked to return after making sure to take them. BP remained high in follow-up so [name] changed metoprolol to carvedilol and placed a 24-hour BP monitor with average BP 148/73. Follow-up was arranged but between 12/16-12/18/18 he was admitted with an episode of blurred vision and difficulty forming words at the nursing home he works at. He was somnolent upon arrival to the ED. Workup revealed AKI on CKD (baseline CR around 1.4-1.5, peaked at 3.07) and marked hyperglycemia (reported CBG 599 per patient). He was not rotating his sites of injection which was felt to be contributing. Otherwise labs showed Hgb 11.4, troponin of 0.32 with relatively flat trend, UDS negative, UA with >500 glucose and 100 protein. Internal medicine felt that troponin elevation was due to high BP and elevated creatinine. HCTZ was stopped and losartan/HCTZ was added. Last TSH 10/2016 wnl. Once again when seen in follow-up 07/01/17, his blood pressure was again high, consistent with several prior clinic values. He had reported compliance with medicines except had  began to "split them up" taking them at different times of the day because he felt better when doing this. He was not sure which ones he took when, but again assured me of compliance. In fact, he reports the first day he took the losartan/HCTZ, it dropped his BP into  the 80s. He reported playing tennis regularly without any chest pain, dyspnea or functional limitation. BMET 1/8 showed K 4.7, Cr 1.55, glucose 307. Nuclear stress test 07/25/17 was ordered for abnormal troponin which showed no evidence of ischemia, EF 58%. Hydralazine was titrated to 75mg  TID.  He returns for follow-up. Despite being told that he was required to bring in his medication bottles to this appointment, he returns without them. He says that ever since increasing one of the medicines to 75mg , he's had intermittent dizziness. When reviewing his medicine list it's not actually clear if he increased the hydralazine or accidentally increased the losartan instead. He felt very weak this AM in the kitchen for over an hour with dizziness. He had to sit down to steady himself to keep from falling. When he checked his blood sugar, it was over 500. His blood pressure has been running 140s-150s at home. It is 158/82 this AM with recheck upon standing of 176/64. Repeat blood sugar in the office is 547. He denies acute dizziness or symptoms at the moment but does admit to feeling less robust the last few days.   Past Medical History:  Diagnosis Date  . Altered mental status    a. 05/2017 - adm with blurred vision, somnolence in setting of AKI and high blood sugar.  . Anemia   . CKD (chronic kidney disease), stage III ([place])   . Diabetes mellitus   . Diastolic CHF, chronic (H[facility])    A.  03/2009 Echo: EF 60-65%, Gr II diast dysfxn  . Elevated troponin    a. 2013 - troponin 1.4. b. 2019 - troponin 0.32; neg nuc 07/2017.  . Glaucoma   . Hyperlipidemia    HYPERCHOLESTEROLEMIA  . Hypertension    MARKED LEFT VENTRICULAR HYPERTROPHY BY PREVIOUS ECHOCARDIOGRAM--HE HAS HYPERDYNAMIC LEFT VENTRICULAR SYSTOLIC FUNCTION AND HAS IMPAIRED RELAXATION BY ECHO  . Hypertrophic cardiomyopathy ([place])   . NSVT (nonsustained ventricular tachycardia) ([place])   . Premature atrial contractions   . PVC's (premature ventricular  contractions)   . SVT (supraventricular tachycardia) (H[facility])     Past Surgical History:  Procedure Laterality Date  . NO PAST SURGERIES      Current Medications: Current Meds  Medication Sig  . amLODipine (NORVASC) 10 MG tablet TAKE ONE TABLET BY MOUTH AT BEDTIME  . atorvastatin (LIPITOR) 20 MG tablet TAKE ONE TABLET BY MOUTH ONCE DAILY  . carvedilol (COREG) 25 MG tablet Take 1 tablet (25 mg total) 2 (two) times daily by mouth.  . hydrALAZINE (APRESOLINE) 50 MG tablet Take 1.5 tablets (75 mg total) by mouth 3 (three) times daily.  . Insulin Glargine (TOUJEO SOLOSTAR) 300 UNIT/ML SOPN Inject 20 Units into the skin at bedtime.   . isosorbide mononitrate (IMDUR) 60 MG 24 hr tablet Take 1 tablet (60 mg total) by mouth daily.  Marland Kitchen losartan-hydrochlorothiazide (HYZAAR) 50-12.5 MG tablet Take 1 tablet by mouth 2 (two) times daily.  . nitroGLYCERIN (NITROSTAT) 0.4 MG SL tablet Place 1 tablet (0.4 mg total) under the tongue every 5 (five) minutes as needed for chest pain.  . Vitamin D, Ergocalciferol, (DRISDOL) 50000 units CAPS capsule Take 50,000 Units by mouth every 7 (seven) days.  Allergies:   Aspirin   Social History   Socioeconomic History  . Marital status: Legally Separated    Spouse name: None  . Number of children: None  . Years of education: None  . Highest education level: None  Social Needs  . Financial resource strain: None  . Food insecurity - worry: None  . Food insecurity - inability: None  . Transportation needs - medical: None  . Transportation needs - non-medical: None  Occupational History  . Occupation: Forensic psychologist: [employer]    Comment: At [employer]  . Smoking status: Never Smoker  . Smokeless tobacco: Never Used  Substance and Sexual Activity  . Alcohol use: No  . Drug use: No  . Sexual activity: No  Other Topics Concern  . None  Social History Narrative   Originally from [place].      Family History:  Family  History  Problem Relation Age of Onset  . Hypertension Father   . Diabetes Brother   . Diabetes Brother     ROS:   Please see the history of present illness.  All other systems are reviewed and otherwise negative.    PHYSICAL EXAM:   VS:  BP (!) 158/82   Pulse 62   Ht 5\' 8"  (1.727 m)   Wt 172 lb 12.8 oz (78.4 kg)   SpO2 97%   BMI 26.27 kg/m   BMI: Body mass index is 26.27 kg/m. GEN: Pleasant AAM, in no acute distress, clothing appears somewhat dirty, patient smells of urine. HEENT: normocephalic, atraumatic, scleral icterus (chronic) noted Neck: no JVD, carotid bruits, or masses Cardiac: RRR; no murmurs, rubs, or gallops, no edema  Respiratory:  clear to auscultation bilaterally, normal work of breathing GI: soft, nontender, nondistended, + BS MS: no deformity or atrophy  Skin: warm and dry, no rash Neuro:  Alert and Oriented x 3, Strength and sensation are intact, follows commands Psych: euthymic mood, full affect  Wt Readings from Last 3 Encounters:  07/31/17 172 lb 12.8 oz (78.4 kg)  07/25/17 183 lb (83 kg)  07/01/17 183 lb (83 kg)      Studies/Labs Reviewed:   EKG:  EKG was ordered today and personally reviewed by me and demonstrates NSR 62bpm, LVH with repolarization abnormality, no acute change from prior.  Recent Labs: 11/07/2016: TSH 3.230 06/08/2017: ALT 18; B Natriuretic Peptide 223.0 06/10/2017: Hemoglobin 11.4; Platelets 153 07/01/2017: BUN 28; Creatinine, Ser 1.55; Potassium 4.7; Sodium 139   Lipid Panel    Component Value Date/Time   CHOL 157 11/07/2016 0922   TRIG 81 11/07/2016 0922   HDL 59 11/07/2016 0922   CHOLHDL 2.7 11/07/2016 0922   CHOLHDL 3 05/04/2014 0852   VLDL 13.6 05/04/2014 0852   LDLCALC 82 11/07/2016 0922    Additional studies/ records that were reviewed today include: Summarized above.    ASSESSMENT & PLAN:   1. Dizziness - he attributes this to increasing one of his medicines to 75mg  but I am concerned this is due to  marked hyperglycemia in the office today of 547. He was not orthostatic and EKG was unchanged. Once again it is extremely difficult to tell what he is taking medicine-wise because when we we went through his list, he pointed to the losartan/HCTZ as the medication he increased to 75mg  TID. Based on my interactions with this patient over the last 2 visits I am concerned that he is either a) not being truthful with his  medication compliance or b) has some memory issues complicating his care. His clothes appear more soiled today and he smells of incontinence. I discussed case with Dr. Saunders Revel and we are recommending the patient proceed to the emergency room for further management of his worsened hyperglycemia. He reports blood sugars are frequently uncontrolled and "they have been adjusting my insulin." I fear the same thing is happening with his diabetes management that is happening with his blood pressure titration (ineffective, questionable compliance). We offered to call EMS but the patient declined. He has no focal deficits and has a steady gait and is agreeable to proceeding to the ER. 2. HTN - anticipate the patient will need med adjustment but this is contingent on his workup in the ED. I worry he accidentally increased his losartan/HCTZ instead of the hydralazine. I believe he should follow with primary care post-ER visit for close BP control as he also appears to require a close tether for his diabetes as well. 3. Elevated troponin - recent nuc reassuring. 4. Hypertrophic cardiomyopathy - historically may be related to high BP but some enhancement on prior imaging. As above, not previously felt to benefit from defibrillator or genetic testing per Dr. Caryl Chaney and team. Luster Landsberg of therapy will be BP management. 5. Altered mental status - adm 05/2017 for this, felt due to marked hyperglycemia. Contingent on ER workup, would recommend primary care consider neurology evaluation. CT head was negative. He is not on  aspirin due to being allergic. 6. History of NSVT/SVT - EKG today shows NSR. No history of syncope or recent palpitations. Per Dr. Caryl Chaney, he did not feel there was a prior indication for ICD. Continue to monitor.  Disposition: F/u with Dr. Meda Chaney in 6 weeks. The patient was made aware he must bring all of his medication bottles to this appointment.   Medication Adjustments/Labs and Tests Ordered: Current medicines are reviewed at length with the patient today.  Concerns regarding medicines are outlined above. Medication changes, Labs and Tests ordered today are summarized above and listed in the Patient Instructions accessible in Encounters.   Signed, Charlie Pitter, PA-C  07/31/2017 10:15 AM    [address], [place], [place]  [address] Phone: [phone]; Fax: [phone]

## 2017-07-31 ENCOUNTER — Ambulatory Visit: Payer: PPO | Admitting: Physician Assistant

## 2017-07-31 ENCOUNTER — Emergency Department (HOSPITAL_COMMUNITY): Payer: PPO

## 2017-07-31 ENCOUNTER — Encounter (HOSPITAL_COMMUNITY): Payer: Self-pay | Admitting: Emergency Medicine

## 2017-07-31 ENCOUNTER — Observation Stay (HOSPITAL_COMMUNITY)
Admission: EM | Admit: 2017-07-31 | Discharge: 2017-08-01 | Disposition: A | Discharge status: Home / Self Care | Payer: PPO | Attending: Family Medicine | Admitting: Family Medicine

## 2017-07-31 ENCOUNTER — Other Ambulatory Visit: Payer: Self-pay

## 2017-07-31 ENCOUNTER — Encounter: Payer: Self-pay | Admitting: Physician Assistant

## 2017-07-31 ENCOUNTER — Encounter (INDEPENDENT_AMBULATORY_CARE_PROVIDER_SITE_OTHER): Payer: Self-pay

## 2017-07-31 VITALS — BP 158/82 | HR 62 | Ht 68.0 in | Wt 172.8 lb

## 2017-07-31 DIAGNOSIS — E118 Type 2 diabetes mellitus with unspecified complications: Secondary | ICD-10-CM | POA: Diagnosis not present

## 2017-07-31 DIAGNOSIS — E875 Hyperkalemia: Secondary | ICD-10-CM | POA: Diagnosis not present

## 2017-07-31 DIAGNOSIS — R55 Syncope and collapse: Secondary | ICD-10-CM | POA: Diagnosis present

## 2017-07-31 DIAGNOSIS — I10 Essential (primary) hypertension: Secondary | ICD-10-CM | POA: Diagnosis not present

## 2017-07-31 DIAGNOSIS — E860 Dehydration: Secondary | ICD-10-CM

## 2017-07-31 DIAGNOSIS — E1165 Type 2 diabetes mellitus with hyperglycemia: Secondary | ICD-10-CM | POA: Insufficient documentation

## 2017-07-31 DIAGNOSIS — R4182 Altered mental status, unspecified: Secondary | ICD-10-CM

## 2017-07-31 DIAGNOSIS — I422 Other hypertrophic cardiomyopathy: Secondary | ICD-10-CM | POA: Insufficient documentation

## 2017-07-31 DIAGNOSIS — E7800 Pure hypercholesterolemia, unspecified: Secondary | ICD-10-CM | POA: Insufficient documentation

## 2017-07-31 DIAGNOSIS — I130 Hypertensive heart and chronic kidney disease with heart failure and stage 1 through stage 4 chronic kidney disease, or unspecified chronic kidney disease: Secondary | ICD-10-CM | POA: Diagnosis not present

## 2017-07-31 DIAGNOSIS — E785 Hyperlipidemia, unspecified: Secondary | ICD-10-CM | POA: Insufficient documentation

## 2017-07-31 DIAGNOSIS — N183 Chronic kidney disease, stage 3 (moderate): Secondary | ICD-10-CM | POA: Diagnosis not present

## 2017-07-31 DIAGNOSIS — E1122 Type 2 diabetes mellitus with diabetic chronic kidney disease: Secondary | ICD-10-CM | POA: Diagnosis not present

## 2017-07-31 DIAGNOSIS — R748 Abnormal levels of other serum enzymes: Secondary | ICD-10-CM | POA: Diagnosis not present

## 2017-07-31 DIAGNOSIS — R778 Other specified abnormalities of plasma proteins: Secondary | ICD-10-CM

## 2017-07-31 DIAGNOSIS — Z794 Long term (current) use of insulin: Secondary | ICD-10-CM | POA: Diagnosis not present

## 2017-07-31 DIAGNOSIS — N184 Chronic kidney disease, stage 4 (severe): Secondary | ICD-10-CM

## 2017-07-31 DIAGNOSIS — I471 Supraventricular tachycardia: Secondary | ICD-10-CM | POA: Insufficient documentation

## 2017-07-31 DIAGNOSIS — I472 Ventricular tachycardia: Secondary | ICD-10-CM | POA: Diagnosis not present

## 2017-07-31 DIAGNOSIS — I5032 Chronic diastolic (congestive) heart failure: Secondary | ICD-10-CM | POA: Diagnosis not present

## 2017-07-31 DIAGNOSIS — N189 Chronic kidney disease, unspecified: Secondary | ICD-10-CM

## 2017-07-31 DIAGNOSIS — R42 Dizziness and giddiness: Secondary | ICD-10-CM

## 2017-07-31 DIAGNOSIS — Z886 Allergy status to analgesic agent status: Secondary | ICD-10-CM | POA: Insufficient documentation

## 2017-07-31 DIAGNOSIS — Z79899 Other long term (current) drug therapy: Secondary | ICD-10-CM | POA: Insufficient documentation

## 2017-07-31 DIAGNOSIS — N179 Acute kidney failure, unspecified: Secondary | ICD-10-CM | POA: Diagnosis present

## 2017-07-31 DIAGNOSIS — R739 Hyperglycemia, unspecified: Secondary | ICD-10-CM | POA: Diagnosis not present

## 2017-07-31 DIAGNOSIS — I4729 Other ventricular tachycardia: Secondary | ICD-10-CM

## 2017-07-31 DIAGNOSIS — R7989 Other specified abnormal findings of blood chemistry: Secondary | ICD-10-CM

## 2017-07-31 LAB — BASIC METABOLIC PANEL
ANION GAP: 11 (ref 5–15)
BUN: 45 mg/dL — ABNORMAL HIGH (ref 6–20)
CALCIUM: 9.2 mg/dL (ref 8.9–10.3)
CHLORIDE: 95 mmol/L — AB (ref 101–111)
CO2: 23 mmol/L (ref 22–32)
Creatinine, Ser: 2.46 mg/dL — ABNORMAL HIGH (ref 0.61–1.24)
GFR calc non Af Amer: 25 mL/min — ABNORMAL LOW (ref >60)
GFR, EST AFRICAN AMERICAN: 29 mL/min — AB (ref >60)
GLUCOSE: 690 mg/dL — AB (ref 65–99)
POTASSIUM: 5.4 mmol/L — AB (ref 3.5–5.1)
Sodium: 129 mmol/L — ABNORMAL LOW (ref 135–145)

## 2017-07-31 LAB — CBC
HEMATOCRIT: 36.8 % — AB (ref 39.0–52.0)
HEMOGLOBIN: 12.1 g/dL — AB (ref 13.0–17.0)
MCH: 31.3 pg (ref 26.0–34.0)
MCHC: 32.9 g/dL (ref 30.0–36.0)
MCV: 95.3 fL (ref 78.0–100.0)
Platelets: 202 10*3/uL (ref 150–400)
RBC: 3.86 MIL/uL — AB (ref 4.22–5.81)
RDW: 12.5 % (ref 11.5–15.5)
WBC: 8.2 10*3/uL (ref 4.0–10.5)

## 2017-07-31 LAB — CBG MONITORING, ED
GLUCOSE-CAPILLARY: 529 mg/dL — AB (ref 65–99)
GLUCOSE-CAPILLARY: 421 mg/dL — AB (ref 65–99)

## 2017-07-31 LAB — URINALYSIS, ROUTINE W REFLEX MICROSCOPIC
BACTERIA UA: NONE SEEN
Bilirubin Urine: NEGATIVE
HGB URINE DIPSTICK: NEGATIVE
KETONES UR: 5 mg/dL — AB
Leukocytes, UA: NEGATIVE
Nitrite: POSITIVE — AB
PROTEIN: NEGATIVE mg/dL
Specific Gravity, Urine: 1.02 (ref 1.005–1.030)
Squamous Epithelial / LPF: NONE SEEN
pH: 6 (ref 5.0–8.0)

## 2017-07-31 MED ORDER — SODIUM CHLORIDE 0.9 % IV BOLUS (SEPSIS)
1000.0000 mL | Freq: Once | INTRAVENOUS | Status: AC
  Administered 2017-07-31: 1000 mL via INTRAVENOUS

## 2017-07-31 MED ORDER — INSULIN ASPART 100 UNIT/ML ~~LOC~~ SOLN
10.0000 [IU] | Freq: Once | SUBCUTANEOUS | Status: AC
  Administered 2017-07-31: 10 [IU] via SUBCUTANEOUS
  Filled 2017-07-31: qty 1

## 2017-07-31 NOTE — ED Triage Notes (Signed)
Pt arrives from home c/o dizziness this am, states, "I was so dizzy I had to hold onto the counter and sit down so I didn't fall." Pt reports LSN 0915 today, L facial droop noted, pt states this is baseline.  No other neuro deficits noted, VAN negative.  Pt reports recent change to BP meds, denies HA, CP, SOB. Pt denies dizziness at this time.

## 2017-07-31 NOTE — Patient Instructions (Signed)
PLEASE GO STRAIGHT TO THE EMERGENCY ROOM.    Medication Instructions:  Your physician recommends that you continue on your current medications as directed. Please refer to the Current Medication list given to you today.   Labwork: None ordered  Testing/Procedures: None ordered  Follow-Up: Your physician recommends that you schedule a follow-up appointment in: [place] PER DAYNA DUNN, PA-C   Any Other Special Instructions Will Be Listed Below (If Applicable).     If you need a refill on your cardiac medications before your next appointment, please call your pharmacy.

## 2017-07-31 NOTE — ED Provider Notes (Signed)
[facility] EMERGENCY DEPARTMENT Provider Note   CSN: [id number] Arrival date & time: 07/31/17  1108     History   Chief Complaint Chief Complaint  Patient presents with  . Dizziness  . Hypertension    HPI Robert Chaney is a 67 y.o. male.  Patient with history of insulin-dependent diabetes, chronic kidney disease, chronically elevated troponin, diastolic heart failure --presents to the emergency department today after having high blood sugars at a cardiology appointment today.  Patient does report after waking up this morning he had an episode of lightheadedness.  He felt like he is going to pass out and lowered himself slowly to the ground without injury.  After couple of minutes he was able to stand up and go about his day.  He drove himself to his appointment.  He was told to come to the emergency department after his blood sugar was found to be very high.  Currently he is feeling well in his normal state of health.  No recent illnesses reported.  He has been taking his insulin as prescribed (but cardiology note raises doubts).  No fevers, URI symptoms, flu symptoms.  No chest pain or shortness of breath.  No nausea, vomiting, or diarrhea.  No urinary symptoms.  Patient states that he has been urinating less frequently over the past several days.  No skin rashes.  Patient denies any vomiting or abdominal pains. Patient denies signs of stroke including: facial droop, slurred speech, aphasia, weakness/numbness in extremities, persistent imbalance/trouble walking.       Past Medical History:  Diagnosis Date  . Altered mental status    a. 05/2017 - adm with blurred vision, somnolence in setting of AKI and high blood sugar.  . Anemia   . CKD (chronic kidney disease), stage III ([place])   . Diabetes mellitus   . Diastolic CHF, chronic (HCC)    A.  03/2009 Echo: EF 60-65%, Gr II diast dysfxn  . Elevated troponin    a. 2013 - troponin 1.4. b. 2019 - troponin 0.32; neg  nuc 07/2017.  . Glaucoma   . Hyperlipidemia    HYPERCHOLESTEROLEMIA  . Hypertension    MARKED LEFT VENTRICULAR HYPERTROPHY BY PREVIOUS ECHOCARDIOGRAM--HE HAS HYPERDYNAMIC LEFT VENTRICULAR SYSTOLIC FUNCTION AND HAS IMPAIRED RELAXATION BY ECHO  . Hypertrophic cardiomyopathy ([place])   . NSVT (nonsustained ventricular tachycardia) ([place])   . Premature atrial contractions   . PVC's (premature ventricular contractions)   . SVT (supraventricular tachycardia) [facility])     Patient Active Problem List   Diagnosis Date Noted  . Acute renal injury ([place]) 06/09/2017  . Hypertensive heart disease 11/07/2016  . Hypertrophic cardiomyopathy ([place]) 07/26/2015  . Dyslipidemia 05/19/2013  . Diabetic hyperosmolar non-ketotic state ([place]) 07/19/2011  . DM (diabetes mellitus) with complications ([place]) 86/57/8469  . Acute on chronic kidney failure ([place]) 07/19/2011  . Troponin level elevated 07/19/2011  . Abnormal EKG 07/19/2011  . Hyperkalemia 07/19/2011  . Volume depletion 07/19/2011  . Diastolic CHF, chronic ([place]) 07/19/2011  . Essential hypertension 07/19/2011  . Elevated CPK 07/19/2011  . Hypercholesterolemia 02/20/2011  . Benign hypertensive heart disease without heart failure 02/20/2011    Past Surgical History:  Procedure Laterality Date  . NO PAST SURGERIES         Home Medications    Prior to Admission medications   Medication Sig Start Date End Date Taking? Authorizing Provider  amLODipine (NORVASC) 10 MG tablet TAKE ONE TABLET BY MOUTH AT BEDTIME 04/21/17   Dorothy Spark,  MD  atorvastatin (LIPITOR) 20 MG tablet TAKE ONE TABLET BY MOUTH ONCE DAILY 06/20/16   Imogene Burn, PA-C  carvedilol (COREG) 25 MG tablet Take 1 tablet (25 mg total) 2 (two) times daily by mouth. 05/13/17 08/11/17  Lyda Jester M, PA-C  hydrALAZINE (APRESOLINE) 50 MG tablet Take 1.5 tablets (75 mg total) by mouth 3 (three) times daily. 07/14/17   Dunn, Nedra Hai, PA-C  Insulin Glargine (TOUJEO SOLOSTAR) 300  UNIT/ML SOPN Inject 20 Units into the skin at bedtime.     [provider]  isosorbide mononitrate (IMDUR) 60 MG 24 hr tablet Take 1 tablet (60 mg total) by mouth daily. 03/29/16   Dorothy Spark, MD  losartan-hydrochlorothiazide (HYZAAR) 50-12.5 MG tablet Take 1 tablet by mouth 2 (two) times daily. 03/29/17   [provider]  nitroGLYCERIN (NITROSTAT) 0.4 MG SL tablet Place 1 tablet (0.4 mg total) under the tongue every 5 (five) minutes as needed for chest pain. 03/29/16   Dorothy Spark, MD  Vitamin D, Ergocalciferol, (DRISDOL) 50000 units CAPS capsule Take 50,000 Units by mouth every 7 (seven) days.    [provider]    Family History Family History  Problem Relation Age of Onset  . Hypertension Father   . Diabetes Brother   . Diabetes Brother     Social History Social History   Tobacco Use  . Smoking status: Never Smoker  . Smokeless tobacco: Never Used  Substance Use Topics  . Alcohol use: No  . Drug use: No     Allergies   Aspirin   Review of Systems Review of Systems  Constitutional: Negative for fever.  HENT: Negative for rhinorrhea and sore throat.   Eyes: Negative for redness.  Respiratory: Negative for cough.   Cardiovascular: Negative for chest pain.  Gastrointestinal: Negative for abdominal pain, diarrhea, nausea and vomiting.  Genitourinary: Positive for decreased urine volume. Negative for dysuria and frequency.  Musculoskeletal: Negative for myalgias.  Skin: Negative for rash.  Neurological: Positive for dizziness and light-headedness. Negative for syncope and headaches.     Physical Exam Updated Vital Signs BP (!) 141/74   Pulse 63   Temp 98.3 F (36.8 C) (Oral)   Resp 18   SpO2 97%   Physical Exam  Constitutional: He appears well-developed and well-nourished.  HENT:  Head: Normocephalic and atraumatic.  Right Ear: Tympanic membrane, external ear and ear canal normal.  Left Ear: Tympanic membrane, external  ear and ear canal normal.  Nose: Nose normal. No mucosal edema or rhinorrhea.  Mouth/Throat: Oropharynx is clear and moist. Mucous membranes are dry.  Eyes: Conjunctivae are normal. Right eye exhibits no discharge. Left eye exhibits no discharge.  Neck: Normal range of motion. Neck supple.  Cardiovascular: Normal rate, regular rhythm and normal heart sounds.  No murmur heard. Pulmonary/Chest: Effort normal and breath sounds normal. No stridor. No respiratory distress. He has no wheezes.  Abdominal: Soft. There is no tenderness. There is no rebound and no guarding.  Neurological: He is alert.  Skin: Skin is warm and dry.  Psychiatric: He has a normal mood and affect.  Nursing note and vitals reviewed.    ED Treatments / Results  Labs (all labs ordered are listed, but only abnormal results are displayed) Labs Reviewed  BASIC METABOLIC PANEL - Abnormal; Notable for the following components:      Result Value   Sodium 129 (*)    Potassium 5.4 (*)    Chloride 95 (*)  Glucose, Bld 690 (*)    BUN 45 (*)    Creatinine, Ser 2.46 (*)    GFR calc non Af Amer 25 (*)    GFR calc Af Amer 29 (*)    All other components within normal limits  CBC - Abnormal; Notable for the following components:   RBC 3.86 (*)    Hemoglobin 12.1 (*)    HCT 36.8 (*)    All other components within normal limits  URINALYSIS, ROUTINE W REFLEX MICROSCOPIC - Abnormal; Notable for the following components:   Glucose, UA >=500 (*)    Ketones, ur 5 (*)    Nitrite POSITIVE (*)    All other components within normal limits  CBG MONITORING, ED - Abnormal; Notable for the following components:   Glucose-Capillary 529 (*)    All other components within normal limits  CBG MONITORING, ED  CBG MONITORING, ED    EKG  EKG Interpretation  Date/Time:  Thursday July 31 2017 12:08:15 EST Ventricular Rate:  68 PR Interval:  150 QRS Duration: 96 QT Interval:  408 QTC Calculation: 433 R Axis:   67 Text  Interpretation:  Normal sinus rhythm Left ventricular hypertrophy with repolarization abnormality Abnormal ECG no significant change compared to Dec 2018 Confirmed by Sherwood Gambler [phone]) on 07/31/2017 12:13:56 PM       Radiology No results found.  Procedures Procedures (including critical care time)  Medications Ordered in ED Medications  sodium chloride 0.9 % bolus 1,000 mL (0 mLs Intravenous Stopped 07/31/17 1915)  sodium chloride 0.9 % bolus 1,000 mL (1,000 mLs Intravenous New Bag/Given 07/31/17 1747)  insulin aspart (novoLOG) injection 10 Units (10 Units Subcutaneous Given 07/31/17 1747)     Initial Impression / Assessment and Plan / ED Course  I have reviewed the triage vital signs and the nursing notes.  Pertinent labs & imaging results that were available during my care of the patient were reviewed by me and considered in my medical decision making (see chart for details).     5:02 PM I have checked for patient several times over the past hour, not yet in room.   Patient seen and examined. Work-up initiated. Fluids ordered.  Dizziness described is consistent with lightheadedness, not vertigo or ataxia.  This was brief and short-lived without other concerning symptoms.  Will check EKG and orthostatics.  Vital signs reviewed and are as follows: BP (!) 141/74   Pulse 63   Temp 98.3 F (36.8 C) (Oral)   Resp 18   SpO2 97%   6:20 PM AKI noted. Will admit for AKI in setting of dehydration due to uncontrolled hyperglycemia. Will screen for infection with CXR and UA. EKG is abnormal but unchanged from previous.   Discussed with Dr. Leonette Monarch who will see.   7:49 PM Urine obtained.   ED ECG REPORT   Date: 07/31/2017  Rate: 68  Rhythm: normal sinus rhythm  QRS Axis: normal  Intervals: normal  ST/T Wave abnormalities: nonspecific ST/T changes  Conduction Disutrbances:none  Narrative Interpretation: LVH  Old EKG Reviewed: unchanged  I have personally reviewed the EKG  tracing and agree with the computerized printout as noted.  8:03 PM Spoke with Dr. Claria Dice who will see patient.    Final Clinical Impressions(s) / ED Diagnoses   Final diagnoses:  Hyperglycemia  Near syncope  Dehydration  Hyperkalemia   Admit for blood sugar in 600's, AKI 2/2 dehydration due to osmotic diuresis, near syncopal spell earlier today. Crt @ 2.5 compared to baseline  of 1.6. No obvious infectious symptoms. No CP. EKG unchanged.   ED Discharge Orders    None       Carlisle Cater, PA-C 08/01/17 0056    Fatima Blank, MD 08/02/17 [id number]

## 2017-07-31 NOTE — ED Notes (Signed)
Patient states that he is feeling better and would like to go home

## 2017-07-31 NOTE — H&P (Signed)
PCP:   [name], MD   Chief Complaint:  Near syncope  HPI: This is a 66 y/o male who woke today went to his kitchen and immediately felt lightheaded and presyncopal. He did not lose consciousness. He gently lowered himself to the ground and sat there for 10-15 minutes. On arising he went his PCP's office. In the office it was noted that his FSBS was very elevated and he was directed to the ER.   The patient denies any chest pains, BOU, fevers, chills, nausea, vomiting or diarrhea. He does states he noted yesterday tbhat his blood sugar reading were to high to read. ghe states he increased his fluid intake. He states  He also noted he had no UOP today until appox 4PM here in the ER, which is very unusual for hin.  In the ER he was noted to be hyperglycemic and with acute on chronic kidney injury. The hsopitallist have been asked to admit.  History provided by the patient.  Review of Systems:  The patient denies anorexia, fever, weight loss,, vision loss, lightheadedness, decreased hearing, hoarseness, chest pain, syncope, dyspnea on exertion, peripheral edema, balance deficits, hemoptysis, abdominal pain, melena, hematochezia, severe indigestion/heartburn, hematuria, incontinence, genital sores, muscle weakness, suspicious skin lesions, transient blindness, difficulty walking, depression, unusual weight change, abnormal bleeding, enlarged lymph nodes, angioedema, and breast masses.  Past Medical History: Past Medical History:  Diagnosis Date  . Altered mental status    a. 05/2017 - adm with blurred vision, somnolence in setting of AKI and high blood sugar.  . Anemia   . CKD (chronic kidney disease), stage III ([place])   . Diabetes mellitus   . Diastolic CHF, chronic (HCC)    A.  03/2009 Echo: EF 60-65%, Gr II diast dysfxn  . Elevated troponin    a. 2013 - troponin 1.4. b. 2019 - troponin 0.32; neg nuc 07/2017.  . Glaucoma   . Hyperlipidemia    HYPERCHOLESTEROLEMIA  .  Hypertension    MARKED LEFT VENTRICULAR HYPERTROPHY BY PREVIOUS ECHOCARDIOGRAM--HE HAS HYPERDYNAMIC LEFT VENTRICULAR SYSTOLIC FUNCTION AND HAS IMPAIRED RELAXATION BY ECHO  . Hypertrophic cardiomyopathy ([place])   . NSVT (nonsustained ventricular tachycardia) ([place])   . Premature atrial contractions   . PVC's (premature ventricular contractions)   . SVT (supraventricular tachycardia) (HCC)    Past Surgical History:  Procedure Laterality Date  . NO PAST SURGERIES      Medications: Prior to Admission medications   Medication Sig Start Date End Date Taking? Authorizing Provider  amLODipine (NORVASC) 10 MG tablet TAKE ONE TABLET BY MOUTH AT BEDTIME 04/21/17  Yes Dorothy Spark, MD  atorvastatin (LIPITOR) 20 MG tablet TAKE ONE TABLET BY MOUTH ONCE DAILY 06/20/16  Yes Imogene Burn, PA-C  carvedilol (COREG) 25 MG tablet Take 1 tablet (25 mg total) 2 (two) times daily by mouth. 05/13/17 08/11/17 Yes Simmons, Brittainy M, PA-C  hydrALAZINE (APRESOLINE) 50 MG tablet Take 1.5 tablets (75 mg total) by mouth 3 (three) times daily. 07/14/17  Yes Dunn, Dayna N, PA-C  Insulin Glargine (TOUJEO SOLOSTAR) 300 UNIT/ML SOPN Inject 30 Units into the skin at bedtime.    Yes [provider]  insulin lispro (HUMALOG) 100 UNIT/ML injection Inject 2-10 Units into the skin 3 (three) times daily before meals. Per sliding scale   Yes [provider]  isosorbide mononitrate (IMDUR) 60 MG 24 hr tablet Take 1 tablet (60 mg total) by mouth daily. 03/29/16  Yes Dorothy Spark, MD  linagliptin [facility])  5 MG TABS tablet Take 5 mg by mouth daily.   Yes [provider]  losartan-hydrochlorothiazide (HYZAAR) 50-12.5 MG tablet Take 1 tablet by mouth 2 (two) times daily. 03/29/17  Yes [provider]  nitroGLYCERIN (NITROSTAT) 0.4 MG SL tablet Place 1 tablet (0.4 mg total) under the tongue every 5 (five) minutes as needed for chest pain. 03/29/16  Yes Dorothy Spark, MD    Allergies:    Allergies  Allergen Reactions  . Aspirin Itching    itching    Social History:  reports that  has never smoked. he has never used smokeless tobacco. He reports that he does not drink alcohol or use drugs.  Family History: Family History  Problem Relation Age of Onset  . Hypertension Father   . Diabetes Brother   . Diabetes Brother     Physical Exam: Vitals:   07/31/17 1830 07/31/17 1900 07/31/17 1915 07/31/17 1945  BP: (!) 163/81 (!) 172/92 (!) 161/88 (!) 142/78  Pulse: 73 81 81 72  Resp: 15 16 (!) 23 18  Temp:      TempSrc:      SpO2: 99% 96% 100% 99%    General:  Alert and oriented times three, well developed and nourished, no acute distress Eyes: PERRLA, pink conjunctiva, no scleral icterus ENT: Moist oral mucosa, neck supple, no thyromegaly Lungs: clear to ascultation, no wheeze, no crackles, no use of accessory muscles Cardiovascular: regular rate and rhythm, no regurgitation, no gallops, no murmurs. No carotid bruits, no JVD Abdomen: soft, positive BS, non-tender, non-distended, no organomegaly, not an acute abdomen GU: not examined Neuro: CN II - XII grossly intact, sensation intact Musculoskeletal: strength 5/5 all extremities, no clubbing, cyanosis or edema Skin: no rash, no subcutaneous crepitation, no decubitus Psych: appropriate patient   Labs on Admission:  Recent Labs    07/31/17 1202  NA 129*  K 5.4*  CL 95*  CO2 23  GLUCOSE 690*  BUN 45*  CREATININE 2.46*  CALCIUM 9.2   No results for input(s): AST, ALT, ALKPHOS, BILITOT, PROT, ALBUMIN in the last 72 hours. No results for input(s): LIPASE, AMYLASE in the last 72 hours. Recent Labs    07/31/17 1202  WBC 8.2  HGB 12.1*  HCT 36.8*  MCV 95.3  PLT 202   No results for input(s): CKTOTAL, CKMB, CKMBINDEX, TROPONINI in the last 72 hours. Invalid input(s): POCBNP No results for input(s): DDIMER in the last 72 hours. No results for input(s): HGBA1C in the last 72 hours. No results for  input(s): CHOL, HDL, LDLCALC, TRIG, CHOLHDL, LDLDIRECT in the last 72 hours. No results for input(s): TSH, T4TOTAL, T3FREE, THYROIDAB in the last 72 hours.  Invalid input(s): FREET3 No results for input(s): VITAMINB12, FOLATE, FERRITIN, TIBC, IRON, RETICCTPCT in the last 72 hours.  Micro Results: No results found for this or any previous visit (from the past 240 hour(s)).   Radiological Exams on Admission: No results found.  Assessment/Plan Present on Admission: . Acute-on-chronic kidney injury ([place]) -Admit to med telemetry -IV fluid hydration.   -Hold losartan and HCTZ combination tablet -Repeat BMP in a.m. -Likely due to hyperglycemia and polyuria  . Near syncope -Likely secondary to dehydration -We will order orthostatic hypotension -Monitor on telemetry, no chest pains, cardiac enzymes in a.m. only  . Hyperglycemia -Hemoglobin A1c -Insulin given in ER, -Home meds resumed, ADA diet.  Sliding scale insulin.  Marland Kitchen Dyslipidemia -stable, home medications resumed  . Hyperkalemia -For now treating with IV fluid hydration.  Repeat BMP in a.m. -No EKG changes -Hold losartan HCT onto the potassium normalized  . DM (diabetes mellitus) with complications ([place]) -See above    Dev Dhondt 07/31/2017, 8:54 PM

## 2017-08-01 DIAGNOSIS — E860 Dehydration: Secondary | ICD-10-CM | POA: Diagnosis not present

## 2017-08-01 LAB — BASIC METABOLIC PANEL
ANION GAP: 12 (ref 5–15)
BUN: 37 mg/dL — ABNORMAL HIGH (ref 6–20)
CO2: 25 mmol/L (ref 22–32)
Calcium: 9.3 mg/dL (ref 8.9–10.3)
Chloride: 100 mmol/L — ABNORMAL LOW (ref 101–111)
Creatinine, Ser: 1.8 mg/dL — ABNORMAL HIGH (ref 0.61–1.24)
GFR, EST AFRICAN AMERICAN: 43 mL/min — AB (ref >60)
GFR, EST NON AFRICAN AMERICAN: 37 mL/min — AB (ref >60)
GLUCOSE: 399 mg/dL — AB (ref 65–99)
POTASSIUM: 4.9 mmol/L (ref 3.5–5.1)
Sodium: 137 mmol/L (ref 135–145)

## 2017-08-01 LAB — CBC
HEMATOCRIT: 39.6 % (ref 39.0–52.0)
HEMOGLOBIN: 13 g/dL (ref 13.0–17.0)
MCH: 31.6 pg (ref 26.0–34.0)
MCHC: 32.8 g/dL (ref 30.0–36.0)
MCV: 96.1 fL (ref 78.0–100.0)
PLATELETS: 193 10*3/uL (ref 150–400)
RBC: 4.12 MIL/uL — ABNORMAL LOW (ref 4.22–5.81)
RDW: 12.4 % (ref 11.5–15.5)
WBC: 5.2 10*3/uL (ref 4.0–10.5)

## 2017-08-01 LAB — CBG MONITORING, ED: GLUCOSE-CAPILLARY: 391 mg/dL — AB (ref 65–99)

## 2017-08-01 LAB — MAGNESIUM: Magnesium: 2.1 mg/dL (ref 1.7–2.4)

## 2017-08-01 MED ORDER — HYDRALAZINE HCL 100 MG PO TABS: 100.0000 mg | ORAL_TABLET | Freq: Three times a day (TID) | ORAL | 0 refills | Status: DC

## 2017-08-01 MED ORDER — HYDRALAZINE HCL 50 MG PO TABS: 75.0000 mg | ORAL_TABLET | Freq: Three times a day (TID) | ORAL | Status: DC

## 2017-08-01 MED ORDER — ACETAMINOPHEN 650 MG RE SUPP: 650.0000 mg | Freq: Four times a day (QID) | RECTAL | Status: DC | PRN

## 2017-08-01 MED ORDER — ONDANSETRON HCL 4 MG/2ML IJ SOLN: 4.0000 mg | Freq: Four times a day (QID) | INTRAMUSCULAR | Status: DC | PRN

## 2017-08-01 MED ORDER — ACETAMINOPHEN 325 MG PO TABS: 650.0000 mg | ORAL_TABLET | Freq: Four times a day (QID) | ORAL | Status: DC | PRN

## 2017-08-01 MED ORDER — INSULIN ASPART 100 UNIT/ML ~~LOC~~ SOLN: 0.0000 [IU] | Freq: Every day | SUBCUTANEOUS | Status: DC

## 2017-08-01 MED ORDER — CARVEDILOL 12.5 MG PO TABS: 25.0000 mg | ORAL_TABLET | Freq: Two times a day (BID) | ORAL | Status: DC

## 2017-08-01 MED ORDER — METOPROLOL TARTRATE 5 MG/5ML IV SOLN: 5.0000 mg | INTRAVENOUS | Status: DC | PRN

## 2017-08-01 MED ORDER — AMLODIPINE BESYLATE 5 MG PO TABS: 10.0000 mg | ORAL_TABLET | Freq: Every day | ORAL | Status: DC

## 2017-08-01 MED ORDER — INSULIN GLARGINE 300 UNIT/ML ~~LOC~~ SOPN: 30.0000 [IU] | PEN_INJECTOR | Freq: Every day | SUBCUTANEOUS | Status: DC

## 2017-08-01 MED ORDER — ENOXAPARIN SODIUM 30 MG/0.3ML ~~LOC~~ SOLN: 30.0000 mg | SUBCUTANEOUS | Status: DC

## 2017-08-01 MED ORDER — INSULIN GLARGINE 300 UNIT/ML ~~LOC~~ SOPN: 35.0000 [IU] | PEN_INJECTOR | Freq: Every day | SUBCUTANEOUS | 0 refills | Status: DC

## 2017-08-01 MED ORDER — LINAGLIPTIN 5 MG PO TABS
5.0000 mg | ORAL_TABLET | Freq: Every day | ORAL | Status: DC
  Filled 2017-08-01: qty 1

## 2017-08-01 MED ORDER — ONDANSETRON HCL 4 MG PO TABS: 4.0000 mg | ORAL_TABLET | Freq: Four times a day (QID) | ORAL | Status: DC | PRN

## 2017-08-01 MED ORDER — ATORVASTATIN CALCIUM 20 MG PO TABS
20.0000 mg | ORAL_TABLET | Freq: Every day | ORAL | Status: DC
  Filled 2017-08-01: qty 1

## 2017-08-01 MED ORDER — INSULIN GLARGINE 100 UNIT/ML ~~LOC~~ SOLN: 30.0000 [IU] | Freq: Every day | SUBCUTANEOUS | Status: DC

## 2017-08-01 MED ORDER — NITROGLYCERIN 0.4 MG SL SUBL: 0.4000 mg | SUBLINGUAL_TABLET | SUBLINGUAL | Status: DC | PRN

## 2017-08-01 MED ORDER — POLYETHYLENE GLYCOL 3350 17 G PO PACK: 17.0000 g | PACK | Freq: Every day | ORAL | Status: DC | PRN

## 2017-08-01 MED ORDER — INSULIN ASPART 100 UNIT/ML ~~LOC~~ SOLN
0.0000 [IU] | Freq: Three times a day (TID) | SUBCUTANEOUS | Status: DC
  Administered 2017-08-01: 15 [IU] via SUBCUTANEOUS
  Filled 2017-08-01: qty 1

## 2017-08-01 MED ORDER — SODIUM CHLORIDE 0.9 % IV SOLN
INTRAVENOUS | Status: DC
  Administered 2017-08-01: 08:00:00 via INTRAVENOUS

## 2017-08-01 MED ORDER — ISOSORBIDE MONONITRATE ER 30 MG PO TB24: 60.0000 mg | ORAL_TABLET | Freq: Every day | ORAL | Status: DC

## 2017-08-01 NOTE — Care Management CC44 (Signed)
Condition Code 44 Documentation Completed  Patient Details  Name: Robert Chaney MRN: [id number] Date of Birth: Mar 26, 1951   Condition Code 44 given:  Yes Patient signature on Condition Code 44 notice:  Yes Documentation of 2 MD's agreement:  Yes Code 44 added to claim:  Yes    Fuller Mandril, RN 08/01/2017, 8:59 AM

## 2017-08-01 NOTE — Care Management Obs Status (Signed)
MEDICARE OBSERVATION STATUS NOTIFICATION   Patient Details  Name: DIALLO PONDER MRN: [id number] Date of Birth: Feb 11, 1951   Medicare Observation Status Notification Given:  Yes    Fuller Mandril, RN 08/01/2017, 8:59 AM

## 2017-08-01 NOTE — Discharge Instructions (Signed)
Please make sure that you look at your medications carefully-we have made some changes Would recommend that you increase your lantus as written Would also recommend that you stop taking your Losartan HCTZ medication and I have increased your hydralazine dosing as well.

## 2017-08-01 NOTE — Progress Notes (Signed)
Physician Discharge Summary  WENDLE KINA GGY:[id number] DOB: February 26, 1951 DOA: 07/31/2017  PCP: [name], MD  Admit date: 07/31/2017 Discharge date: 08/01/2017  Time spent: 20 minutes  Recommendations for Outpatient Follow-up:  1. Patient's Lantus has been increased this hospital visit and he has been counseled to make sure that he has follow-up in the outpatient setting and have meds adjusted he might need addition of metformin to his insulin and Tradjenta 2. I would recommend he follow-up with his cardiologist for further cardiac care and blood pressure management-I have discontinue losartan HCTZ and increase his hydralazine the for ease of management he may benefit from BiDil if his insurance can cover the same as this would  decrease the pill burden 3. Recommend basic metabolic panel as an outpatient 1 week  Discharge Diagnoses:  Active Problems:   DM (diabetes mellitus) with complications (HCC)   Acute on chronic renal failure (HCC)   Hyperkalemia   Dyslipidemia   Hyperglycemia   Near syncope   Acute-on-chronic kidney injury [facility])   Discharge Condition: Improved  Diet recommendation: Diabetic  There were no vitals filed for this visit.  History of present illness:  67 year old male diabetic hypertensive heart failure on multiple meds was seen at cardiology office 07/31/2016 and found to have a sugar of 500 he has been feeling fair overall but has had uncontrolled blood sugar and was found to have a creatinine 2.4 range This is not new for the patient he has had creatinines occasionally in the 3 range likely secondary to either poor blood pressure control or volume depletion from high sugars He was monitored in the emergency room sugars were closer in the 300 range I discussed with him in detail that he will get a list of medications but that he needs to follow-up with his regular physician going forward to make some decisions about his medications including changing off of losartan  HCTZ and he will need to augment his Lantus dose He understood this clearly on discharge  Consultations:  None  Discharge Exam: Vitals:   08/01/17 0600 08/01/17 0645  BP: (!) 163/90 (!) 169/95  Pulse: 64 72  Resp: 14 16  Temp:    SpO2: 97% 98%    General: EOMI NCAT Cardiovascular: S1-S2 no murmur rub or gallop Respiratory: Clinically clear no added sound  Discharge Instructions   Discharge Instructions    Diet - low sodium heart healthy   Complete by:  As directed    Discharge instructions   Complete by:  As directed    Take your meds as directed Your regular MD should see you and adjust your insulin further--note that I havbe increased your long acting insulin that you take daily   Increase activity slowly   Complete by:  As directed      Allergies as of 08/01/2017      Reactions   Aspirin Itching   itching      Medication List    STOP taking these medications   losartan-hydrochlorothiazide 50-12.5 MG tablet Commonly known as:  HYZAAR     TAKE these medications   amLODipine 10 MG tablet Commonly known as:  NORVASC TAKE ONE TABLET BY MOUTH AT BEDTIME   atorvastatin 20 MG tablet Commonly known as:  LIPITOR TAKE ONE TABLET BY MOUTH ONCE DAILY   carvedilol 25 MG tablet Commonly known as:  COREG Take 1 tablet (25 mg total) 2 (two) times daily by mouth.   hydrALAZINE 100 MG tablet Commonly known as:  APRESOLINE  Take 1 tablet (100 mg total) by mouth 3 (three) times daily. What changed:    medication strength  how much to take   Insulin Glargine 300 UNIT/ML Sopn Commonly known as:  TOUJEO SOLOSTAR Inject 35 Units into the skin at bedtime. What changed:  how much to take   insulin lispro 100 UNIT/ML injection Commonly known as:  HUMALOG Inject 2-10 Units into the skin 3 (three) times daily before meals. Per sliding scale   isosorbide mononitrate 60 MG 24 hr tablet Commonly known as:  IMDUR Take 1 tablet (60 mg total) by mouth daily.    linagliptin 5 MG Tabs tablet Commonly known as:  TRADJENTA Take 5 mg by mouth daily.   nitroGLYCERIN 0.4 MG SL tablet Commonly known as:  NITROSTAT Place 1 tablet (0.4 mg total) under the tongue every 5 (five) minutes as needed for chest pain.      Allergies  Allergen Reactions  . Aspirin Itching    itching      The results of significant diagnostics from this hospitalization (including imaging, microbiology, ancillary and laboratory) are listed below for reference.    Significant Diagnostic Studies: Dg Chest 2 View  Result Date: 07/31/2017 CLINICAL DATA:  Syncopal episode today EXAM: CHEST  2 VIEW COMPARISON:  July 19, 2011 FINDINGS: The heart size and mediastinal contours are stable. The heart size is upper limits of normal. Both lungs are clear. The visualized skeletal structures are stable. IMPRESSION: No active cardiopulmonary disease. Electronically Signed   By: Abelardo Diesel M.D.   On: 07/31/2017 21:07    Microbiology: No results found for this or any previous visit (from the past 240 hour(s)).   Labs: Basic Metabolic Panel: Recent Labs  Lab 07/31/17 1202  NA 129*  K 5.4*  CL 95*  CO2 23  GLUCOSE 690*  BUN 45*  CREATININE 2.46*  CALCIUM 9.2   Liver Function Tests: No results for input(s): AST, ALT, ALKPHOS, BILITOT, PROT, ALBUMIN in the last 168 hours. No results for input(s): LIPASE, AMYLASE in the last 168 hours. No results for input(s): AMMONIA in the last 168 hours. CBC: Recent Labs  Lab 07/31/17 1202 08/01/17 0716  WBC 8.2 5.2  HGB 12.1* 13.0  HCT 36.8* 39.6  MCV 95.3 96.1  PLT 202 193   Cardiac Enzymes: No results for input(s): CKTOTAL, CKMB, CKMBINDEX, TROPONINI in the last 168 hours. BNP: BNP (last 3 results) Recent Labs    06/08/17 1116  BNP 223.0*    ProBNP (last 3 results) No results for input(s): PROBNP in the last 8760 hours.  CBG: Recent Labs  Lab 07/31/17 1740 07/31/17 2000 08/01/17 0753  GLUCAP 529* 421* 391*        Signed:  Nita Sells MD   [facility] 08/01/2017, 8:45 AM

## 2017-08-07 DIAGNOSIS — H34831 Tributary (branch) retinal vein occlusion, right eye, with macular edema: Secondary | ICD-10-CM | POA: Diagnosis not present

## 2017-08-07 DIAGNOSIS — H401132 Primary open-angle glaucoma, bilateral, moderate stage: Secondary | ICD-10-CM | POA: Diagnosis not present

## 2017-08-07 DIAGNOSIS — Z961 Presence of intraocular lens: Secondary | ICD-10-CM | POA: Diagnosis not present

## 2017-08-08 ENCOUNTER — Telehealth: Payer: Self-pay | Admitting: *Deleted

## 2017-08-08 NOTE — Telephone Encounter (Addendum)
Informed patient of sleep study results and patient understanding was verbalized. Patient understands he has significant sleep apnea and had successful PAP titration and will be set up with PAP unit. Patient understands he will be contacted by [facility] to set up her cpap. He understands to call if [facility] does not contact him with new setup in a timely manner. He understands he will be called once confirmation has been received from [facility] that he has received his new machine to schedule 10 week follow up appointment.  [facility] notified of new cpap order  Please add to Robert Chaney He was grateful for the call and thanked me

## 2017-08-08 NOTE — Telephone Encounter (Signed)
-----   Message from Sueanne Margarita, MD sent at 07/27/2017  9:10 PM EST ----- Please let patient know that they have significant sleep apnea and had successful PAP titration and will be set up with PAP unit.  Please let DME know that order is in EPIC.  Please set patient up for OV in 10 weeks

## 2017-08-11 DIAGNOSIS — I509 Heart failure, unspecified: Secondary | ICD-10-CM | POA: Diagnosis not present

## 2017-08-11 DIAGNOSIS — I10 Essential (primary) hypertension: Secondary | ICD-10-CM | POA: Diagnosis not present

## 2017-08-11 DIAGNOSIS — E1122 Type 2 diabetes mellitus with diabetic chronic kidney disease: Secondary | ICD-10-CM | POA: Diagnosis not present

## 2017-08-11 DIAGNOSIS — E7849 Other hyperlipidemia: Secondary | ICD-10-CM | POA: Diagnosis not present

## 2017-08-15 DIAGNOSIS — H401123 Primary open-angle glaucoma, left eye, severe stage: Secondary | ICD-10-CM | POA: Diagnosis not present

## 2017-08-15 DIAGNOSIS — Z961 Presence of intraocular lens: Secondary | ICD-10-CM | POA: Diagnosis not present

## 2017-08-15 DIAGNOSIS — H401112 Primary open-angle glaucoma, right eye, moderate stage: Secondary | ICD-10-CM | POA: Diagnosis not present

## 2017-08-15 NOTE — Telephone Encounter (Deleted)
-----   Message from Sueanne Margarita, MD sent at 07/27/2017  9:10 PM EST ----- Please let patient know that they have significant sleep apnea and had successful PAP titration and will be set up with PAP unit.  Please let DME know that order is in EPIC.  Please set patient up for OV in 10 weeks

## 2017-09-02 ENCOUNTER — Telehealth: Payer: Self-pay | Admitting: *Deleted

## 2017-09-02 NOTE — Telephone Encounter (Signed)
Per Dr Theodosia Blender sleep study note on  07/25/2017:   Trial of CPAP therapy on 11 cm H2O with a Large size Resmed Full Face Mask AirFit F20 mask and heated humidification. Order sent to [facility]

## 2017-09-10 DIAGNOSIS — E7849 Other hyperlipidemia: Secondary | ICD-10-CM | POA: Diagnosis not present

## 2017-09-10 DIAGNOSIS — I509 Heart failure, unspecified: Secondary | ICD-10-CM | POA: Diagnosis not present

## 2017-09-10 DIAGNOSIS — Z125 Encounter for screening for malignant neoplasm of prostate: Secondary | ICD-10-CM | POA: Diagnosis not present

## 2017-09-10 DIAGNOSIS — I10 Essential (primary) hypertension: Secondary | ICD-10-CM | POA: Diagnosis not present

## 2017-09-10 DIAGNOSIS — E1122 Type 2 diabetes mellitus with diabetic chronic kidney disease: Secondary | ICD-10-CM | POA: Diagnosis not present

## 2017-09-15 ENCOUNTER — Ambulatory Visit: Payer: PPO | Admitting: Cardiology

## 2017-09-15 ENCOUNTER — Encounter: Payer: Self-pay | Admitting: Cardiology

## 2017-09-15 VITALS — BP 162/90 | HR 65 | Ht 68.0 in | Wt 171.8 lb

## 2017-09-15 DIAGNOSIS — N183 Chronic kidney disease, stage 3 (moderate): Secondary | ICD-10-CM | POA: Diagnosis not present

## 2017-09-15 DIAGNOSIS — N179 Acute kidney failure, unspecified: Secondary | ICD-10-CM | POA: Diagnosis not present

## 2017-09-15 DIAGNOSIS — E118 Type 2 diabetes mellitus with unspecified complications: Secondary | ICD-10-CM | POA: Diagnosis not present

## 2017-09-15 DIAGNOSIS — I422 Other hypertrophic cardiomyopathy: Secondary | ICD-10-CM

## 2017-09-15 DIAGNOSIS — I10 Essential (primary) hypertension: Secondary | ICD-10-CM | POA: Diagnosis not present

## 2017-09-15 MED ORDER — FUROSEMIDE 20 MG PO TABS: 20.0000 mg | ORAL_TABLET | Freq: Every day | ORAL | 3 refills | Status: DC

## 2017-09-15 NOTE — Progress Notes (Signed)
Cardiology Office Note    Date:  09/15/2017   ID:  Robert Chaney, DOB [birth year], MRN [id number]  PCP:  [name], MD  Cardiologist:  Ena Dawley, MD   Chief complain: 6 months follow-up   History of Present Illness:  Robert Chaney is a 67 y.o. male who presents for a six-month follow-up visit he is a prior patient of Dr. Mare Ferrari, followed for diabetes, hypertension, diastolic CHF. 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 Based on his most recent echocardiogram in 03/2016 he has high suspicion for HCM with septal hypertrophy of 18 mm. There is no family history of sudden cardiac death or coronary artery disease. He has no living children. He denies any dizziness, palpitations or syncope.  He states fairly active denies claudications lower extremity edema or orthopnea.He checks his blood pressure at home and the highest it has been was 142/80 mmHg.   09/15/2017 - this is 6 months follow-up, the patient has been feeling well, he continues to work full time, he denies any chest pain, shortness of breath, palpitation dizziness or syncope. He has mi chronic lower extremity edema. He states that his blood pressure at home is around 120-130 mmHg.  He is concerned about his poorly controlled diabetes. His hydralazine was increased to 100 daily however he was experiencing and it has been decreased to 50 mg PO 3 times a day.   Past Medical History:  Diagnosis Date  . Altered mental status    a. 05/2017 - adm with blurred vision, somnolence in setting of AKI and high blood sugar.  . Anemia   . CKD (chronic kidney disease), stage III ([place])   . Diabetes mellitus   . Diastolic CHF, chronic (HCC)    A.  03/2009 Echo: EF 60-65%, Gr II diast dysfxn  . Elevated troponin    a. 2013 - troponin 1.4. b. 2019 - troponin 0.32; neg nuc 07/2017.  . Glaucoma   . Hyperlipidemia    HYPERCHOLESTEROLEMIA  . Hypertension    MARKED LEFT VENTRICULAR HYPERTROPHY  BY PREVIOUS ECHOCARDIOGRAM--HE HAS HYPERDYNAMIC LEFT VENTRICULAR SYSTOLIC FUNCTION AND HAS IMPAIRED RELAXATION BY ECHO  . Hypertrophic cardiomyopathy ([place])   . NSVT (nonsustained ventricular tachycardia) ([place])   . Premature atrial contractions   . PVC's (premature ventricular contractions)   . SVT (supraventricular tachycardia) (HCC)     Past Surgical History:  Procedure Laterality Date  . NO PAST SURGERIES      Current Medications: Outpatient Medications Prior to Visit  Medication Sig Dispense Refill  . amLODipine (NORVASC) 10 MG tablet TAKE ONE TABLET BY MOUTH AT BEDTIME 90 tablet 2  . atorvastatin (LIPITOR) 20 MG tablet TAKE ONE TABLET BY MOUTH ONCE DAILY 90 tablet 3  . carvedilol (COREG) 25 MG tablet Take 1 tablet (25 mg total) 2 (two) times daily by mouth. 60 tablet 3  . hydrALAZINE (APRESOLINE) 50 MG tablet Take 1 tablet by mouth 3 (three) times daily.    . Insulin Glargine (TOUJEO SOLOSTAR) 300 UNIT/ML SOPN Inject 35 Units into the skin at bedtime. 1 pen 0  . insulin lispro (HUMALOG) 100 UNIT/ML injection Inject 2-10 Units into the skin 3 (three) times daily before meals. Per sliding scale    . isosorbide mononitrate (IMDUR) 60 MG 24 hr tablet Take 1 tablet (60 mg total) by mouth daily. 90 tablet 6  . linagliptin (TRADJENTA) 5 MG TABS tablet Take 5 mg by mouth daily.    Marland Kitchen  nitroGLYCERIN (NITROSTAT) 0.4 MG SL tablet Place 1 tablet (0.4 mg total) under the tongue every 5 (five) minutes as needed for chest pain. 3 tablet 2  . hydrALAZINE (APRESOLINE) 100 MG tablet Take 1 tablet (100 mg total) by mouth 3 (three) times daily. 90 tablet 0   No facility-administered medications prior to visit.      Allergies:   Aspirin   Social History   Socioeconomic History  . Marital status: Legally Separated    Spouse name: Not on file  . Number of children: Not on file  . Years of education: Not on file  . Highest education level: Not on file  Occupational History  . Occupation: Cytogeneticist: [employer]    Comment: At [employer]  . Financial resource strain: Not on file  . Food insecurity:    Worry: Not on file    Inability: Not on file  . Transportation needs:    Medical: Not on file    Non-medical: Not on file  Tobacco Use  . Smoking status: Never Smoker  . Smokeless tobacco: Never Used  Substance and Sexual Activity  . Alcohol use: No  . Drug use: No  . Sexual activity: Never  Lifestyle  . Physical activity:    Days per week: Not on file    Minutes per session: Not on file  . Stress: Not on file  Relationships  . Social connections:    Talks on phone: Not on file    Gets together: Not on file    Attends religious service: Not on file    Active member of club or organization: Not on file    Attends meetings of clubs or organizations: Not on file    Relationship status: Not on file  Other Topics Concern  . Not on file  Social History Narrative   Originally from [place].     Family History:  The patient's family history includes Diabetes in his brother and brother; Hypertension in his father.   ROS:   Please see the history of present illness.    ROS All other systems reviewed and are negative    Cardiology Office Note    Date:  09/15/2017   ID:  Robert Chaney, DOB 08-Sep-1950, MRN [id number]  PCP:  [name], MD  Cardiologist:  Ena Dawley, MD   Chief complain: 6 months follow-up   History of Present Illness:  Robert Chaney is a 67 y.o. male who presents for a six-month follow-up visit he is a prior patient of Dr. Mare Ferrari, followed for diabetes, hypertension, diastolic CHF. 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 Based on his most recent echocardiogram in 03/2016 he has high suspicion for HCM with septal hypertrophy of 18 mm. There is no family history of sudden cardiac death or coronary artery disease. He has no living children. He denies any dizziness,  palpitations or syncope.  He states fairly active denies claudications lower extremity edema or orthopnea.He checks his blood pressure at home and the highest it has been was 142/80 mmHg.    Past Medical History:  Diagnosis Date  . Altered mental status    a. 05/2017 - adm with blurred vision, somnolence in setting of AKI and high blood sugar.  . Anemia   . CKD (chronic kidney disease), stage III ([place])   . Diabetes mellitus   . Diastolic CHF, chronic (HCC)  A.  03/2009 Echo: EF 60-65%, Gr II diast dysfxn  . Elevated troponin    a. 2013 - troponin 1.4. b. 2019 - troponin 0.32; neg nuc 07/2017.  . Glaucoma   . Hyperlipidemia    HYPERCHOLESTEROLEMIA  . Hypertension    MARKED LEFT VENTRICULAR HYPERTROPHY BY PREVIOUS ECHOCARDIOGRAM--HE HAS HYPERDYNAMIC LEFT VENTRICULAR SYSTOLIC FUNCTION AND HAS IMPAIRED RELAXATION BY ECHO  . Hypertrophic cardiomyopathy ([place])   . NSVT (nonsustained ventricular tachycardia) ([place])   . Premature atrial contractions   . PVC's (premature ventricular contractions)   . SVT (supraventricular tachycardia) (HCC)     Past Surgical History:  Procedure Laterality Date  . NO PAST SURGERIES      Current Medications: Outpatient Medications Prior to Visit  Medication Sig Dispense Refill  . amLODipine (NORVASC) 10 MG tablet TAKE ONE TABLET BY MOUTH AT BEDTIME 90 tablet 2  . atorvastatin (LIPITOR) 20 MG tablet TAKE ONE TABLET BY MOUTH ONCE DAILY 90 tablet 3  . carvedilol (COREG) 25 MG tablet Take 1 tablet (25 mg total) 2 (two) times daily by mouth. 60 tablet 3  . hydrALAZINE (APRESOLINE) 50 MG tablet Take 1 tablet by mouth 3 (three) times daily.    . Insulin Glargine (TOUJEO SOLOSTAR) 300 UNIT/ML SOPN Inject 35 Units into the skin at bedtime. 1 pen 0  . insulin lispro (HUMALOG) 100 UNIT/ML injection Inject 2-10 Units into the skin 3 (three) times daily before meals. Per sliding scale    . isosorbide mononitrate (IMDUR) 60 MG 24 hr tablet Take 1 tablet (60 mg total)  by mouth daily. 90 tablet 6  . linagliptin (TRADJENTA) 5 MG TABS tablet Take 5 mg by mouth daily.    . nitroGLYCERIN (NITROSTAT) 0.4 MG SL tablet Place 1 tablet (0.4 mg total) under the tongue every 5 (five) minutes as needed for chest pain. 3 tablet 2  . hydrALAZINE (APRESOLINE) 100 MG tablet Take 1 tablet (100 mg total) by mouth 3 (three) times daily. 90 tablet 0   No facility-administered medications prior to visit.      Allergies:   Aspirin   Social History   Socioeconomic History  . Marital status: Legally Separated    Spouse name: Not on file  . Number of children: Not on file  . Years of education: Not on file  . Highest education level: Not on file  Occupational History  . Occupation: Forensic psychologist: [employer]    Comment: At [employer]  . Financial resource strain: Not on file  . Food insecurity:    Worry: Not on file    Inability: Not on file  . Transportation needs:    Medical: Not on file    Non-medical: Not on file  Tobacco Use  . Smoking status: Never Smoker  . Smokeless tobacco: Never Used  Substance and Sexual Activity  . Alcohol use: No  . Drug use: No  . Sexual activity: Never  Lifestyle  . Physical activity:    Days per week: Not on file    Minutes per session: Not on file  . Stress: Not on file  Relationships  . Social connections:    Talks on phone: Not on file    Gets together: Not on file    Attends religious service: Not on file    Active member of club or organization: Not on file    Attends meetings of clubs or organizations: Not on file    Relationship status: Not on file  Other Topics Concern  . Not on file  Social History Narrative   Originally from [place].     Family History:  The patient's family history includes Diabetes in his brother and brother; Hypertension in his father.   ROS:   Please see the history of present illness.    ROS All other systems reviewed and are negative.  PHYSICAL  EXAM:   VS:  BP (!) 162/90   Pulse 65   Ht 5\' 8"  (1.727 m)   Wt 171 lb 12.8 oz (77.9 kg)   SpO2 95%   BMI 26.12 kg/m    GEN: Well nourished, well developed, in no acute distress  HEENT: normal  Neck: no JVD, carotid bruits, or masses Cardiac: RRR; 1/6 systolic murmur, rubs, or gallops,no edema  Respiratory:  clear to auscultation bilaterally, normal work of breathing GI: soft, nontender, nondistended, + BS MS: no deformity or atrophy  Skin: warm and dry, no rash Neuro:  Alert and Oriented x 3, Strength and sensation are intact Psych: euthymic mood, full affect  Wt Readings from Last 3 Encounters:  09/15/17 171 lb 12.8 oz (77.9 kg)  07/31/17 172 lb 12.8 oz (78.4 kg)  07/25/17 183 lb (83 kg)     Studies/Labs Reviewed:   Recent Labs: 11/07/2016: TSH 3.230 06/08/2017: ALT 18; B Natriuretic Peptide 223.0 08/01/2017: BUN 37; Creatinine, Ser 1.80; Hemoglobin 13.0; Magnesium 2.1; Platelets 193; Potassium 4.9; Sodium 137   Lipid Panel    Component Value Date/Time   CHOL 157 11/07/2016 0922   TRIG 81 11/07/2016 0922   HDL 59 11/07/2016 0922   CHOLHDL 2.7 11/07/2016 0922   CHOLHDL 3 05/04/2014 0852   VLDL 13.6 05/04/2014 0852   LDLCALC 82 11/07/2016 0922   EKG performed today 11/07/2016 was personally reviewed and shows normal sinus rhythm with LVH with repolarization abnormalities and is unchanged from prior.   ASSESSMENT:    1. Hypertrophic cardiomyopathy ([place])   2. Essential hypertension   3. DM (diabetes mellitus) with complications ([place])   4. Acute renal failure superimposed on stage 3 chronic kidney disease, unspecified acute renal failure type ([place])     PLAN:  In order of problems listed above:  1. Hypertrophic cardiomyopathy, most recent echo consistent with hypertrophic cardiomyopathy a septal thickness 18 mm without significant gradient and no SAM. We will obtain cardiac MRI to further risk stratify. 2. Hypertension - again uncontrolled, however controlled at  home he will send [place] measurements from home as this can be whitecoat syndrome.I will add Lasix 20 mg daily. 3. Hyperlipidemia he is on atorvastatin 20 mg daily. He is tolerating it well. 4. Chronic diastolic CHF - add Lasix 20 mg daily. 5. CK D stage IV - referred to nephrology 6. Diabetes mellitus insulin-dependent - managed by his PCP however poor control blood sugars in the range of 600, I will referr to Dr.Nida who is an endocrinologist.  The patient is instructed to start hydralazine in addition to all of his blood pressure medicine. He will look into my chart and send me couple blood pressure measurements in the next 2 weeks and we will readjust his medication. He will follow-up in 6 months. We will obtainechocardiogram in the meantime.  Medication Adjustments/Labs and Tests Ordered: Current medicines are reviewed at length with the patient today.  Concerns regarding medicines are outlined above.  Medication changes, Labs and Tests ordered today are listed in the Patient Instructions below. Patient Instructions  Medication Instructions:   START TAKING LASIX 20  MG ONCE DAILY     You have been referred to ENDOCRINOLOGY TO SEE DR. NIDA   You have been referred to NEPHROLOGY      Follow-Up:  Your physician wants you to follow-up in: [place] will receive a reminder letter in the mail two months in advance. If you don't receive a letter, please call our office to schedule the follow-up appointment.        If you need a refill on your cardiac medications before your next appointment, please call your pharmacy.   Signed, Ena Dawley, MD  09/15/2017 1:03 PM    [facility] [address], [place], [place]  [address] Phone: [phone]; Fax: [phone]) [phone]   .  PHYSICAL EXAM:   VS:  BP (!) 162/90   Pulse 65   Ht 5\' 8"  (1.727 m)   Wt 171 lb 12.8 oz (77.9 kg)   SpO2 95%   BMI 26.12 kg/m    GEN: Well nourished, well  developed, in no acute distress  HEENT: normal  Neck: no JVD, carotid bruits, or masses Cardiac: RRR; 1/6 systolic murmur, rubs, or gallops,no edema  Respiratory:  clear to auscultation bilaterally, normal work of breathing GI: soft, nontender, nondistended, + BS MS: no deformity or atrophy  Skin: warm and dry, no rash Neuro:  Alert and Oriented x 3, Strength and sensation are intact Psych: euthymic mood, full affect  Wt Readings from Last 3 Encounters:  09/15/17 171 lb 12.8 oz (77.9 kg)  07/31/17 172 lb 12.8 oz (78.4 kg)  07/25/17 183 lb (83 kg)     Studies/Labs Reviewed:   Recent Labs: 11/07/2016: TSH 3.230 06/08/2017: ALT 18; B Natriuretic Peptide 223.0 08/01/2017: BUN 37; Creatinine, Ser 1.80; Hemoglobin 13.0; Magnesium 2.1; Platelets 193; Potassium 4.9; Sodium 137   Lipid Panel    Component Value Date/Time   CHOL 157 11/07/2016 0922   TRIG 81 11/07/2016 0922   HDL 59 11/07/2016 0922   CHOLHDL 2.7 11/07/2016 0922   CHOLHDL 3 05/04/2014 0852   VLDL 13.6 05/04/2014 0852   LDLCALC 82 11/07/2016 0922   EKG performed today 11/07/2016 was personally reviewed and shows normal sinus rhythm with LVH with repolarization abnormalities and is unchanged from prior.   ASSESSMENT:    1. Hypertrophic cardiomyopathy ([place])   2. Essential hypertension   3. DM (diabetes mellitus) with complications ([place])   4. Acute renal failure superimposed on stage 3 chronic kidney disease, unspecified acute renal failure type ([place])     PLAN:  In order of problems listed above:  7. Hypertrophic cardiomyopathy, most recent echo consistent with hypertrophic cardiomyopathy a septal thickness 18 mm without significant gradient and no SAM. We will obtain cardiac MRI to further risk stratify. 8. Hypertension - again uncontrolled however controlled at home he will send [place] measurements from home as this can be whitecoat syndrome. 9. Hyperlipidemia he is on atorvastatin 40 mg daily. We will recheck today  including CMP.  The patient is instructed to start hydralazine in addition to all of his blood pressure medicine. He will look into my chart and send me couple blood pressure measurements in the next 2 weeks and we will readjust his medication. He will follow-up in 6 months. We will obtainechocardiogram in the meantime.  Medication Adjustments/Labs and Tests Ordered: Current medicines are reviewed at length with the patient today.  Concerns regarding medicines are outlined above.  Medication changes, Labs and Tests ordered today are listed  in the Patient Instructions below. Patient Instructions  Medication Instructions:   START TAKING LASIX 20 MG ONCE DAILY     You have been referred to ENDOCRINOLOGY TO SEE DR. NIDA   You have been referred to NEPHROLOGY      Follow-Up:  Your physician wants you to follow-up in: [place] will receive a reminder letter in the mail two months in advance. If you don't receive a letter, please call our office to schedule the follow-up appointment.        If you need a refill on your cardiac medications before your next appointment, please call your pharmacy.   Signed, Ena Dawley, MD  09/15/2017 1:03 PM    [facility] [place], [place], [place]  [address] Phone: [phone]; Fax: [phone]

## 2017-09-15 NOTE — Patient Instructions (Signed)
Medication Instructions:   START TAKING LASIX 20 MG ONCE DAILY     You have been referred to ENDOCRINOLOGY TO SEE DR. NIDA   You have been referred to NEPHROLOGY      Follow-Up:  Your physician wants you to follow-up in: [place] will receive a reminder letter in the mail two months in advance. If you don't receive a letter, please call our office to schedule the follow-up appointment.        If you need a refill on your cardiac medications before your next appointment, please call your pharmacy.

## 2017-09-16 NOTE — Telephone Encounter (Signed)
Called [facility] to check on the status of the order and sharon said he would be set up this week (3/26) and that I should proceed with scheduling his 10 week follow up appointment. Spoke to patient to schedule his 10 week appointment and patient says he will call me back today to schedule.

## 2017-09-17 ENCOUNTER — Other Ambulatory Visit: Payer: Self-pay | Admitting: Physician Assistant

## 2017-09-17 DIAGNOSIS — I509 Heart failure, unspecified: Secondary | ICD-10-CM | POA: Diagnosis not present

## 2017-09-17 DIAGNOSIS — E1122 Type 2 diabetes mellitus with diabetic chronic kidney disease: Secondary | ICD-10-CM | POA: Diagnosis not present

## 2017-09-17 DIAGNOSIS — E7800 Pure hypercholesterolemia, unspecified: Secondary | ICD-10-CM

## 2017-09-17 DIAGNOSIS — I5032 Chronic diastolic (congestive) heart failure: Secondary | ICD-10-CM

## 2017-09-17 DIAGNOSIS — I10 Essential (primary) hypertension: Secondary | ICD-10-CM

## 2017-09-17 DIAGNOSIS — I119 Hypertensive heart disease without heart failure: Secondary | ICD-10-CM

## 2017-09-17 DIAGNOSIS — N183 Chronic kidney disease, stage 3 (moderate): Secondary | ICD-10-CM | POA: Diagnosis not present

## 2017-09-18 NOTE — Telephone Encounter (Signed)
[facility] Robert Chaney has tried to reach the patient unsuccessfully if he is not reached by close of business Friday 09/19/17 they will send a letter to the patient.

## 2017-09-22 ENCOUNTER — Encounter: Payer: Self-pay | Admitting: *Deleted

## 2017-09-22 NOTE — Telephone Encounter (Signed)
Called patient Robert Chaney to schedule 10 week sleep appointment. Spoke with sharon who says they also can not reach the patient and will mail letter to him. I will mail a letter.

## 2017-09-24 ENCOUNTER — Telehealth: Payer: Self-pay | Admitting: *Deleted

## 2017-09-24 NOTE — Telephone Encounter (Signed)
AMB REFERRAL TO NEPHROLOGY  Received: Today  Message Contents  Conley Simmonds, LPN        Appt made with Dr Carolin Sicks on 10-01-17 at 8am

## 2017-09-26 DIAGNOSIS — I509 Heart failure, unspecified: Secondary | ICD-10-CM | POA: Diagnosis not present

## 2017-09-26 DIAGNOSIS — E1122 Type 2 diabetes mellitus with diabetic chronic kidney disease: Secondary | ICD-10-CM | POA: Diagnosis not present

## 2017-09-26 DIAGNOSIS — K610 Anal abscess: Secondary | ICD-10-CM | POA: Diagnosis not present

## 2017-09-26 DIAGNOSIS — I10 Essential (primary) hypertension: Secondary | ICD-10-CM | POA: Diagnosis not present

## 2017-09-30 DIAGNOSIS — H34831 Tributary (branch) retinal vein occlusion, right eye, with macular edema: Secondary | ICD-10-CM | POA: Diagnosis not present

## 2017-09-30 DIAGNOSIS — E1139 Type 2 diabetes mellitus with other diabetic ophthalmic complication: Secondary | ICD-10-CM | POA: Diagnosis not present

## 2017-10-01 DIAGNOSIS — N183 Chronic kidney disease, stage 3 (moderate): Secondary | ICD-10-CM | POA: Diagnosis not present

## 2017-10-01 DIAGNOSIS — I129 Hypertensive chronic kidney disease with stage 1 through stage 4 chronic kidney disease, or unspecified chronic kidney disease: Secondary | ICD-10-CM | POA: Diagnosis not present

## 2017-10-02 ENCOUNTER — Other Ambulatory Visit: Payer: Self-pay | Admitting: Nephrology

## 2017-10-02 DIAGNOSIS — I129 Hypertensive chronic kidney disease with stage 1 through stage 4 chronic kidney disease, or unspecified chronic kidney disease: Secondary | ICD-10-CM

## 2017-10-02 DIAGNOSIS — N183 Chronic kidney disease, stage 3 unspecified: Secondary | ICD-10-CM

## 2017-10-03 DIAGNOSIS — E1122 Type 2 diabetes mellitus with diabetic chronic kidney disease: Secondary | ICD-10-CM | POA: Diagnosis not present

## 2017-10-03 DIAGNOSIS — K610 Anal abscess: Secondary | ICD-10-CM | POA: Diagnosis not present

## 2017-10-03 DIAGNOSIS — I10 Essential (primary) hypertension: Secondary | ICD-10-CM | POA: Diagnosis not present

## 2017-10-03 NOTE — Telephone Encounter (Signed)
Return call: Patient called back to say he tried calling [facility] at [phone] and could not get an answer and he had to go to work and would not get off until after office hours at 7pm. I called and left a message for Robert Chaney on his home phone that he had one number wrong and should call the right number at [phone]. I also called sharon at [facility] to inform her that the patient called back and was given the right number but called the wrong number. Robert Chaney says she will call him and leave the right number on his home phone also.

## 2017-10-03 NOTE — Telephone Encounter (Signed)
Return call: Patient called back today stating he received the no contact letter our office sent him. I gave him all contact information for [facility] to get set up with his CPAP machine. Patient was agreeable with treatment.

## 2017-10-17 ENCOUNTER — Ambulatory Visit
Admission: RE | Admit: 2017-10-17 | Discharge: 2017-10-17 | Disposition: A | Discharge status: Home / Self Care | Payer: PPO | Source: Ambulatory Visit | Attending: Nephrology | Admitting: Nephrology

## 2017-10-17 DIAGNOSIS — N183 Chronic kidney disease, stage 3 unspecified: Secondary | ICD-10-CM

## 2017-10-17 DIAGNOSIS — I129 Hypertensive chronic kidney disease with stage 1 through stage 4 chronic kidney disease, or unspecified chronic kidney disease: Secondary | ICD-10-CM

## 2017-10-20 DIAGNOSIS — E7849 Other hyperlipidemia: Secondary | ICD-10-CM | POA: Diagnosis not present

## 2017-10-20 DIAGNOSIS — I10 Essential (primary) hypertension: Secondary | ICD-10-CM | POA: Diagnosis not present

## 2017-10-20 DIAGNOSIS — E1122 Type 2 diabetes mellitus with diabetic chronic kidney disease: Secondary | ICD-10-CM | POA: Diagnosis not present

## 2017-10-20 DIAGNOSIS — I509 Heart failure, unspecified: Secondary | ICD-10-CM | POA: Diagnosis not present

## 2017-10-30 NOTE — Telephone Encounter (Signed)
Called patient lmtcb at [phone] to call [facility] at [phone] to get set up with his cpap.

## 2017-11-19 DIAGNOSIS — E1122 Type 2 diabetes mellitus with diabetic chronic kidney disease: Secondary | ICD-10-CM | POA: Diagnosis not present

## 2017-11-19 DIAGNOSIS — I509 Heart failure, unspecified: Secondary | ICD-10-CM | POA: Diagnosis not present

## 2017-11-19 DIAGNOSIS — I10 Essential (primary) hypertension: Secondary | ICD-10-CM | POA: Diagnosis not present

## 2017-11-19 DIAGNOSIS — E7849 Other hyperlipidemia: Secondary | ICD-10-CM | POA: Diagnosis not present

## 2017-11-20 DIAGNOSIS — H401123 Primary open-angle glaucoma, left eye, severe stage: Secondary | ICD-10-CM | POA: Diagnosis not present

## 2017-11-28 ENCOUNTER — Encounter: Payer: Self-pay | Admitting: Endocrinology

## 2017-12-09 DIAGNOSIS — H34831 Tributary (branch) retinal vein occlusion, right eye, with macular edema: Secondary | ICD-10-CM | POA: Diagnosis not present

## 2017-12-17 DIAGNOSIS — H401112 Primary open-angle glaucoma, right eye, moderate stage: Secondary | ICD-10-CM | POA: Diagnosis not present

## 2017-12-17 DIAGNOSIS — H409 Unspecified glaucoma: Secondary | ICD-10-CM | POA: Diagnosis not present

## 2017-12-17 DIAGNOSIS — Z01818 Encounter for other preprocedural examination: Secondary | ICD-10-CM | POA: Diagnosis not present

## 2017-12-30 DIAGNOSIS — H318 Other specified disorders of choroid: Secondary | ICD-10-CM | POA: Diagnosis not present

## 2018-01-02 DIAGNOSIS — I509 Heart failure, unspecified: Secondary | ICD-10-CM | POA: Diagnosis not present

## 2018-01-02 DIAGNOSIS — N183 Chronic kidney disease, stage 3 (moderate): Secondary | ICD-10-CM | POA: Diagnosis not present

## 2018-01-02 DIAGNOSIS — E1122 Type 2 diabetes mellitus with diabetic chronic kidney disease: Secondary | ICD-10-CM | POA: Diagnosis not present

## 2018-01-02 DIAGNOSIS — I10 Essential (primary) hypertension: Secondary | ICD-10-CM | POA: Diagnosis not present

## 2018-01-05 DIAGNOSIS — N183 Chronic kidney disease, stage 3 (moderate): Secondary | ICD-10-CM | POA: Diagnosis not present

## 2018-01-05 DIAGNOSIS — I129 Hypertensive chronic kidney disease with stage 1 through stage 4 chronic kidney disease, or unspecified chronic kidney disease: Secondary | ICD-10-CM | POA: Diagnosis not present

## 2018-01-05 DIAGNOSIS — D631 Anemia in chronic kidney disease: Secondary | ICD-10-CM | POA: Diagnosis not present

## 2018-03-11 DIAGNOSIS — H34831 Tributary (branch) retinal vein occlusion, right eye, with macular edema: Secondary | ICD-10-CM | POA: Diagnosis not present

## 2018-03-16 ENCOUNTER — Ambulatory Visit: Payer: PPO | Admitting: Cardiology

## 2018-03-16 ENCOUNTER — Encounter: Payer: Self-pay | Admitting: Cardiology

## 2018-03-16 VITALS — BP 154/86 | HR 51 | Ht 68.0 in | Wt 189.8 lb

## 2018-03-16 DIAGNOSIS — E7800 Pure hypercholesterolemia, unspecified: Secondary | ICD-10-CM | POA: Diagnosis not present

## 2018-03-16 DIAGNOSIS — I110 Hypertensive heart disease with heart failure: Secondary | ICD-10-CM | POA: Diagnosis not present

## 2018-03-16 DIAGNOSIS — G4733 Obstructive sleep apnea (adult) (pediatric): Secondary | ICD-10-CM | POA: Diagnosis not present

## 2018-03-16 DIAGNOSIS — N183 Chronic kidney disease, stage 3 (moderate): Secondary | ICD-10-CM | POA: Diagnosis not present

## 2018-03-16 DIAGNOSIS — E118 Type 2 diabetes mellitus with unspecified complications: Secondary | ICD-10-CM

## 2018-03-16 DIAGNOSIS — N179 Acute kidney failure, unspecified: Secondary | ICD-10-CM

## 2018-03-16 DIAGNOSIS — I422 Other hypertrophic cardiomyopathy: Secondary | ICD-10-CM

## 2018-03-16 DIAGNOSIS — I421 Obstructive hypertrophic cardiomyopathy: Secondary | ICD-10-CM

## 2018-03-16 DIAGNOSIS — I119 Hypertensive heart disease without heart failure: Secondary | ICD-10-CM

## 2018-03-16 DIAGNOSIS — I5032 Chronic diastolic (congestive) heart failure: Secondary | ICD-10-CM | POA: Diagnosis not present

## 2018-03-16 DIAGNOSIS — I10 Essential (primary) hypertension: Secondary | ICD-10-CM | POA: Diagnosis not present

## 2018-03-16 MED ORDER — ATORVASTATIN CALCIUM 20 MG PO TABS: 20.0000 mg | ORAL_TABLET | Freq: Every day | ORAL | 2 refills | Status: AC

## 2018-03-16 MED ORDER — FUROSEMIDE 20 MG PO TABS: 20.0000 mg | ORAL_TABLET | Freq: Every day | ORAL | 3 refills | Status: DC

## 2018-03-16 MED ORDER — AMLODIPINE BESYLATE 10 MG PO TABS: 10.0000 mg | ORAL_TABLET | Freq: Every day | ORAL | 2 refills | Status: DC

## 2018-03-16 MED ORDER — CARVEDILOL 25 MG PO TABS: 25.0000 mg | ORAL_TABLET | Freq: Two times a day (BID) | ORAL | 2 refills | Status: DC

## 2018-03-16 MED ORDER — ISOSORBIDE MONONITRATE ER 60 MG PO TB24: 60.0000 mg | ORAL_TABLET | Freq: Every day | ORAL | 2 refills | Status: DC

## 2018-03-16 MED ORDER — HYDRALAZINE HCL 50 MG PO TABS: 50.0000 mg | ORAL_TABLET | Freq: Three times a day (TID) | ORAL | 2 refills | Status: DC

## 2018-03-16 NOTE — Progress Notes (Signed)
Cardiology Office Note    Date:  03/16/2018   ID:  FORNEY KLEINPETER, DOB 1950/12/24, MRN [id number]  PCP:  [name], MD  Cardiologist:  Ena Dawley, MD   Chief complain: 6 months follow-up   History of Present Illness:  MAXTEN SHULER is a 67 y.o. male who presents for a six-month follow-up visit he is a prior patient of Dr. Mare Ferrari, followed for diabetes, hypertension, diastolic CHF. 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 Based on his most recent echocardiogram in 03/2016 he has high suspicion for HCM with septal hypertrophy of 18 mm. There is no family history of sudden cardiac death or coronary artery disease. He has no living children. He denies any dizziness, palpitations or syncope.  He states fairly active denies claudications lower extremity edema or orthopnea.He checks his blood pressure at home and the highest it has been was 142/80 mmHg.   09/15/2017 - this is 6 months follow-up, the patient has been feeling well, he continues to work full time, he denies any chest pain, shortness of breath, palpitation dizziness or syncope. He has mi chronic lower extremity edema. He states that his blood pressure at home is around 120-130 mmHg.  He is concerned about his poorly controlled diabetes. His hydralazine was increased to 100 daily however he was experiencing and it has been decreased to 50 mg PO 3 times a day.  03/16/2018, patient is coming after 6 months, he denies any symptoms of chest pain, worsening shortness of breath, dizziness, palpitations syncope or falls.  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  He enjoys it.  He walks daily and is asymptomatic with that.  He has been compliant with his medications and has no side effects.  Blood pressure at home 130-140.  Past Medical History:  Diagnosis Date  . Altered mental status    a. 05/2017 - adm with blurred vision, somnolence in setting of  AKI and high blood sugar.  . Anemia   . CKD (chronic kidney disease), stage III ([place])   . Diabetes mellitus   . Diastolic CHF, chronic (HCC)    A.  03/2009 Echo: EF 60-65%, Gr II diast dysfxn  . Elevated troponin    a. 2013 - troponin 1.4. b. 2019 - troponin 0.32; neg nuc 07/2017.  . Glaucoma   . Hyperlipidemia    HYPERCHOLESTEROLEMIA  . Hypertension    MARKED LEFT VENTRICULAR HYPERTROPHY BY PREVIOUS ECHOCARDIOGRAM--HE HAS HYPERDYNAMIC LEFT VENTRICULAR SYSTOLIC FUNCTION AND HAS IMPAIRED RELAXATION BY ECHO  . Hypertrophic cardiomyopathy ([place])   . NSVT (nonsustained ventricular tachycardia) ([place])   . Premature atrial contractions   . PVC's (premature ventricular contractions)   . SVT (supraventricular tachycardia) (HCC)    Past Surgical History:  Procedure Laterality Date  . NO PAST SURGERIES     Current Medications: Outpatient Medications Prior to Visit  Medication Sig Dispense Refill  . Insulin Glargine (TOUJEO MAX SOLOSTAR) 300 UNIT/ML SOPN Inject 40 Units into the skin at bedtime.    . insulin lispro (HUMALOG) 100 UNIT/ML injection Inject 2-10 Units into the skin 3 (three) times daily before meals. Per sliding scale    . linagliptin (TRADJENTA) 5 MG TABS tablet Take 5 mg by mouth daily.    . nitroGLYCERIN (NITROSTAT) 0.4 MG SL tablet Place 1 tablet (0.4 mg total) under the tongue every 5 (five) minutes as needed for chest pain. 3  tablet 2  . amLODipine (NORVASC) 10 MG tablet TAKE ONE TABLET BY MOUTH AT BEDTIME 90 tablet 2  . atorvastatin (LIPITOR) 20 MG tablet TAKE ONE TABLET BY MOUTH ONCE DAILY 90 tablet 3  . carvedilol (COREG) 25 MG tablet Take 1 tablet (25 mg total) 2 (two) times daily by mouth. 60 tablet 3  . furosemide (LASIX) 20 MG tablet Take 1 tablet (20 mg total) by mouth daily. 90 tablet 3  . hydrALAZINE (APRESOLINE) 50 MG tablet Take 1 tablet by mouth 3 (three) times daily.    . isosorbide mononitrate (IMDUR) 60 MG 24 hr tablet Take 1 tablet (60 mg total) by mouth  daily. 90 tablet 6  . Insulin Glargine (TOUJEO SOLOSTAR) 300 UNIT/ML SOPN Inject 35 Units into the skin at bedtime. 1 pen 0   No facility-administered medications prior to visit.      Allergies:   Aspirin   Social History   Socioeconomic History  . Marital status: Legally Separated    Spouse name: Not on file  . Number of children: Not on file  . Years of education: Not on file  . Highest education level: Not on file  Occupational History  . Occupation: Forensic psychologist: [employer]    Comment: At [employer]  . Financial resource strain: Not on file  . Food insecurity:    Worry: Not on file    Inability: Not on file  . Transportation needs:    Medical: Not on file    Non-medical: Not on file  Tobacco Use  . Smoking status: Never Smoker  . Smokeless tobacco: Never Used  Substance and Sexual Activity  . Alcohol use: No  . Drug use: No  . Sexual activity: Never  Lifestyle  . Physical activity:    Days per week: Not on file    Minutes per session: Not on file  . Stress: Not on file  Relationships  . Social connections:    Talks on phone: Not on file    Gets together: Not on file    Attends religious service: Not on file    Active member of club or organization: Not on file    Attends meetings of clubs or organizations: Not on file    Relationship status: Not on file  Other Topics Concern  . Not on file  Social History Narrative   Originally from [place].     Family History:  The patient's family history includes Diabetes in his brother and brother; Hypertension in his father.   ROS:   Please see the history of present illness.    ROS All other systems reviewed and are negative   PHYSICAL EXAM:   VS:  BP (!) 154/86   Pulse (!) 51   Ht 5\' 8"  (1.727 m)   Wt 189 lb 12.8 oz (86.1 kg)   SpO2 91%   BMI 28.86 kg/m    GEN: Well nourished, well developed, in no acute distress  HEENT: normal  Neck: no JVD, carotid bruits, or  masses Cardiac: RRR; 1/6 systolic murmur, rubs, or gallops,no edema  Respiratory:  clear to auscultation bilaterally, normal work of breathing GI: soft, nontender, nondistended, + BS MS: no deformity or atrophy  Skin: warm and dry, no rash Neuro:  Alert and Oriented x 3, Strength and sensation are intact Psych: euthymic mood, full affect  Wt Readings from Last 3 Encounters:  03/16/18 189 lb 12.8 oz (86.1 kg)  09/15/17 171 lb  12.8 oz (77.9 kg)  07/31/17 172 lb 12.8 oz (78.4 kg)     Studies/Labs Reviewed:   Recent Labs: 06/08/2017: ALT 18; B Natriuretic Peptide 223.0 08/01/2017: BUN 37; Creatinine, Ser 1.80; Hemoglobin 13.0; Magnesium 2.1; Platelets 193; Potassium 4.9; Sodium 137   Lipid Panel    Component Value Date/Time   CHOL 157 11/07/2016 0922   TRIG 81 11/07/2016 0922   HDL 59 11/07/2016 0922   CHOLHDL 2.7 11/07/2016 0922   CHOLHDL 3 05/04/2014 0852   VLDL 13.6 05/04/2014 0852   LDLCALC 82 11/07/2016 0922   EKG performed today 11/07/2016 was personally reviewed and shows normal sinus rhythm with LVH with repolarization abnormalities and is unchanged from prior.  MRI: 10/2016 1. Normal left ventricular size with severe left ventricular hypertrophy with basal septal thickness of 21 mm and normal systolic function (LVEF = 62%). There are no regional wall motion abnormalities.  There are diffuse patchy late gadolinium enhancement in the basal anterior, anteroseptal, inferior, inferolateral, mid anterior, inferior, inferolateral and apical inferior walls.  2.  Normal right ventricular size, thickness and systolic volume.  3.  Mild mitral and tricuspid regurgitation.  Collectively, these findings are consistent with hypertrophic cardiomyopathy with high risk feature - presence of LGE in the 8 myocardial segments.   ASSESSMENT:    1. Essential hypertension   2. Hypertensive heart disease with congestive heart failure, unspecified heart failure type ([place])   3.  Hypertrophic cardiomyopathy ([place])   4. OSA (obstructive sleep apnea)   5. HTN (hypertension), benign   6. Diastolic CHF, chronic (HCC)   7. Hypercholesterolemia   8. Benign hypertensive heart disease without heart failure   9. DM (diabetes mellitus) with complications ([place])   10. Acute renal failure superimposed on stage 3 chronic kidney disease, unspecified acute renal failure type ([place])   11. 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[place])     PLAN:  In order of problems listed above:  1. Hypertrophic cardiomyopathy, most recent echo consistent with hypertrophic cardiomyopathy a septal thickness 18 mm without significant gradient and no SAM. MRI in May 2018 confirmed hypertrophic cardiomyopathy with high risk feature - presence of LGE in the 8 myocardial segments.  Upper septal thickness 21 mm, the patient is completely asymptomatic has no prior episode of syncope in him or any of her family members no family history of sudden cardiac death.  We will follow-up closely.  2. Hypertension -repeated blood pressure 138 mmHg.  We will continue current medications.  3. Hyperlipidemia he is on atorvastatin 20 mg daily. He is tolerating it well.  4. Chronic diastolic CHF -she is euvolemic.  5. CK D stage IV -followed by nephrology  6.  Sleep apnea - he missed his appointment earlier this year, we will refer again.  The patient is instructed to start hydralazine in addition to all of his blood pressure medicine. He will look into my chart and send me couple blood pressure measurements in the next 2 weeks and we will readjust his medication. He will follow-up in 6 months. We will obtainechocardiogram in the meantime.  Medication Adjustments/Labs and Tests Ordered: Current medicines are reviewed at length with the patient today.  Concerns regarding medicines are outlined above.  Medication changes, Labs and Tests ordered today are listed in the Patient Instructions below. Patient  Instructions  Medication Instructions:   Your physician recommends that you continue on your current medications as directed. Please refer to the Current Medication list given to you today.  You have been referred to HAVE A SPLIT NIGHT SLEEP STUDY DONE       Follow-Up:  Your physician wants you to follow-up in: [place] will receive a reminder letter in the mail two months in advance. If you don't receive a letter, please call our office to schedule the follow-up appointment.        If you need a refill on your cardiac medications before your next appointment, please call your pharmacy.   Signed, Ena Dawley, MD  03/16/2018 9:00 AM    [address], [place], [place]  [address] Phone: [phone]; Fax: [phone]   .

## 2018-03-16 NOTE — Patient Instructions (Signed)
Medication Instructions:   Your physician recommends that you continue on your current medications as directed. Please refer to the Current Medication list given to you today.      You have been referred to HAVE A SPLIT NIGHT SLEEP STUDY DONE       Follow-Up:  Your physician wants you to follow-up in: [place] will receive a reminder letter in the mail two months in advance. If you don't receive a letter, please call our office to schedule the follow-up appointment.        If you need a refill on your cardiac medications before your next appointment, please call your pharmacy.

## 2018-04-03 DIAGNOSIS — Z23 Encounter for immunization: Secondary | ICD-10-CM | POA: Diagnosis not present

## 2018-04-03 DIAGNOSIS — N183 Chronic kidney disease, stage 3 (moderate): Secondary | ICD-10-CM | POA: Diagnosis not present

## 2018-04-03 DIAGNOSIS — I509 Heart failure, unspecified: Secondary | ICD-10-CM | POA: Diagnosis not present

## 2018-04-03 DIAGNOSIS — I10 Essential (primary) hypertension: Secondary | ICD-10-CM | POA: Diagnosis not present

## 2018-04-03 DIAGNOSIS — E1122 Type 2 diabetes mellitus with diabetic chronic kidney disease: Secondary | ICD-10-CM | POA: Diagnosis not present

## 2018-04-03 DIAGNOSIS — Z719 Counseling, unspecified: Secondary | ICD-10-CM | POA: Diagnosis not present

## 2018-05-06 IMAGING — DX DG CHEST 2V
2 series · 2 of 2 positions shown · non-contrast
Comparison: July 19, 2011

CLINICAL DATA: Syncopal episode today

EXAM:
CHEST  2 VIEW

[w chest lat]
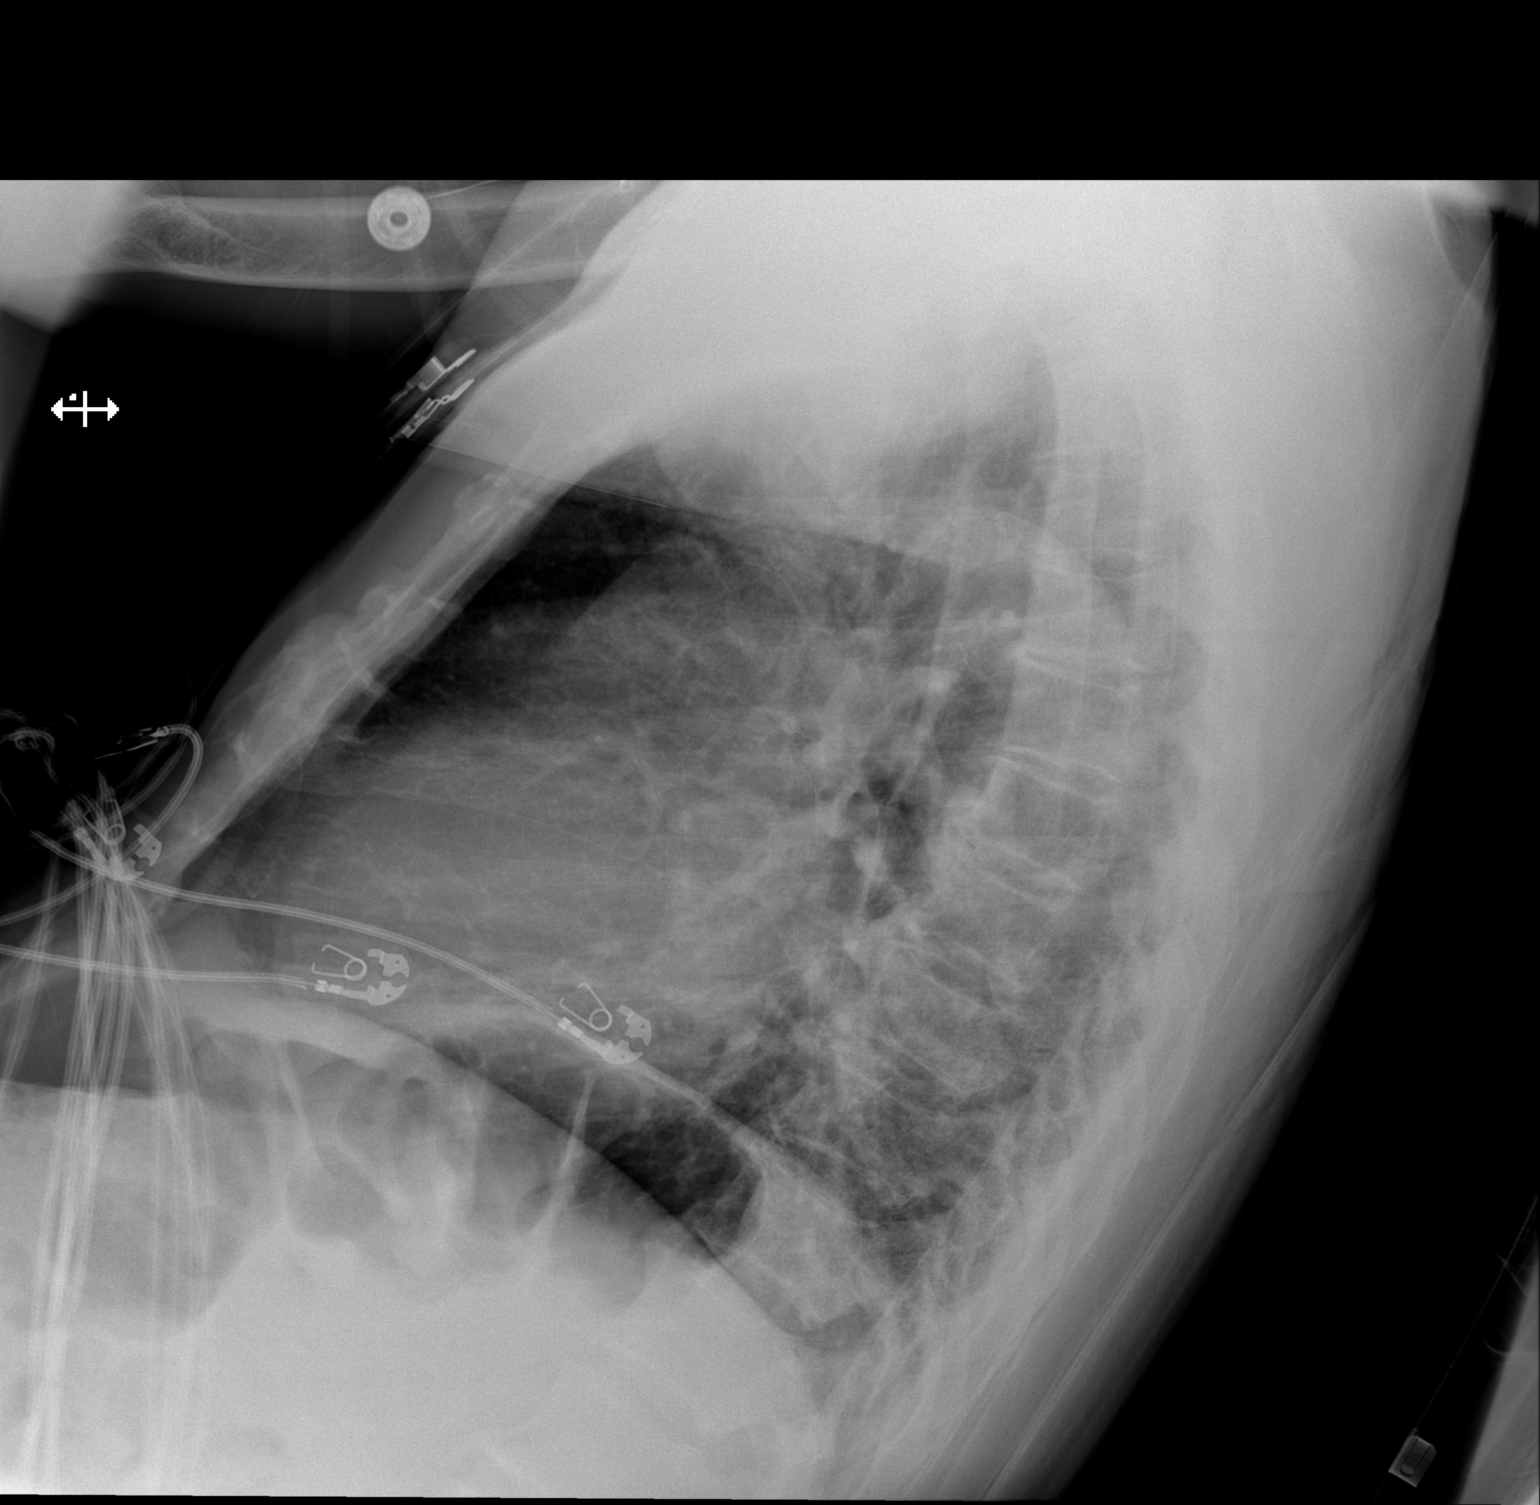

[x chest ap]
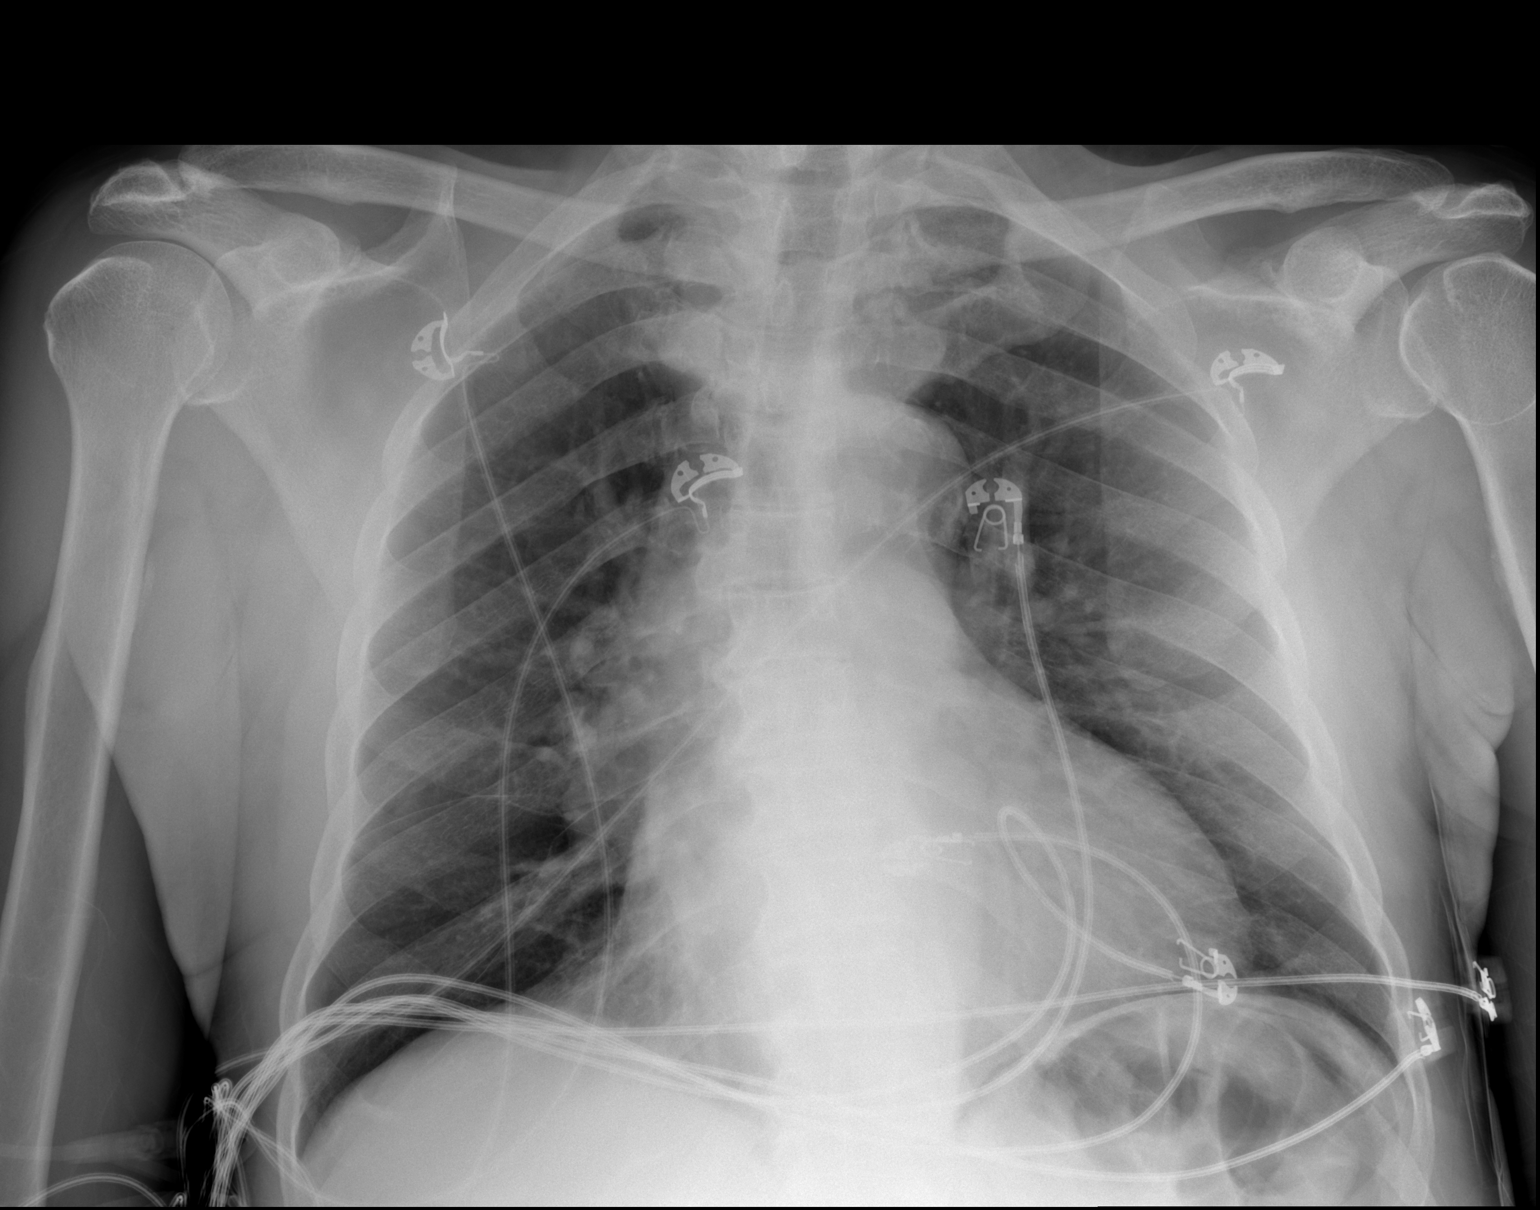

[2 of 2 positions shown; findings below may reference images not displayed]

FINDINGS: The heart size and mediastinal contours are stable. The heart size
is upper limits of normal. Both lungs are clear. The visualized
skeletal structures are stable.
IMPRESSION: No active cardiopulmonary disease.

## 2018-05-29 DIAGNOSIS — N189 Chronic kidney disease, unspecified: Secondary | ICD-10-CM | POA: Diagnosis not present

## 2018-05-29 DIAGNOSIS — N183 Chronic kidney disease, stage 3 (moderate): Secondary | ICD-10-CM | POA: Diagnosis not present

## 2018-06-05 DIAGNOSIS — N2581 Secondary hyperparathyroidism of renal origin: Secondary | ICD-10-CM | POA: Diagnosis not present

## 2018-06-05 DIAGNOSIS — N183 Chronic kidney disease, stage 3 (moderate): Secondary | ICD-10-CM | POA: Diagnosis not present

## 2018-06-05 DIAGNOSIS — D631 Anemia in chronic kidney disease: Secondary | ICD-10-CM | POA: Diagnosis not present

## 2018-06-05 DIAGNOSIS — I129 Hypertensive chronic kidney disease with stage 1 through stage 4 chronic kidney disease, or unspecified chronic kidney disease: Secondary | ICD-10-CM | POA: Diagnosis not present

## 2018-06-10 DIAGNOSIS — H34831 Tributary (branch) retinal vein occlusion, right eye, with macular edema: Secondary | ICD-10-CM | POA: Diagnosis not present

## 2018-06-10 DIAGNOSIS — E113292 Type 2 diabetes mellitus with mild nonproliferative diabetic retinopathy without macular edema, left eye: Secondary | ICD-10-CM | POA: Diagnosis not present

## 2018-06-10 DIAGNOSIS — E1139 Type 2 diabetes mellitus with other diabetic ophthalmic complication: Secondary | ICD-10-CM | POA: Diagnosis not present

## 2018-06-11 DIAGNOSIS — H401112 Primary open-angle glaucoma, right eye, moderate stage: Secondary | ICD-10-CM | POA: Diagnosis not present

## 2018-06-19 DIAGNOSIS — I129 Hypertensive chronic kidney disease with stage 1 through stage 4 chronic kidney disease, or unspecified chronic kidney disease: Secondary | ICD-10-CM | POA: Diagnosis not present

## 2018-06-30 ENCOUNTER — Encounter: Payer: Self-pay | Admitting: Internal Medicine

## 2018-06-30 ENCOUNTER — Ambulatory Visit (INDEPENDENT_AMBULATORY_CARE_PROVIDER_SITE_OTHER): Payer: PPO | Admitting: Internal Medicine

## 2018-06-30 VITALS — BP 164/78 | HR 78 | Ht 69.6 in | Wt 195.0 lb

## 2018-06-30 DIAGNOSIS — N183 Chronic kidney disease, stage 3 unspecified: Secondary | ICD-10-CM

## 2018-06-30 DIAGNOSIS — E1122 Type 2 diabetes mellitus with diabetic chronic kidney disease: Secondary | ICD-10-CM

## 2018-06-30 DIAGNOSIS — E1142 Type 2 diabetes mellitus with diabetic polyneuropathy: Secondary | ICD-10-CM | POA: Diagnosis not present

## 2018-06-30 DIAGNOSIS — E118 Type 2 diabetes mellitus with unspecified complications: Secondary | ICD-10-CM

## 2018-06-30 DIAGNOSIS — Z794 Long term (current) use of insulin: Secondary | ICD-10-CM | POA: Diagnosis not present

## 2018-06-30 LAB — BASIC METABOLIC PANEL
BUN: 35 mg/dL — AB (ref 6–23)
CO2: 31 mEq/L (ref 19–32)
CREATININE: 1.81 mg/dL — AB (ref 0.40–1.50)
Calcium: 9.8 mg/dL (ref 8.4–10.5)
Chloride: 99 mEq/L (ref 96–112)
GFR: 47.99 mL/min — ABNORMAL LOW (ref >60.00)
Glucose, Bld: 152 mg/dL — ABNORMAL HIGH (ref 70–99)
Potassium: 4 mEq/L (ref 3.5–5.1)
Sodium: 135 mEq/L (ref 135–145)

## 2018-06-30 LAB — GLUCOSE, POCT (MANUAL RESULT ENTRY): POC Glucose: 155 mg/dl — AB (ref 70–99)

## 2018-06-30 MED ORDER — INSULIN ASPART 100 UNIT/ML ~~LOC~~ SOLN: 6.0000 [IU] | Freq: Three times a day (TID) | SUBCUTANEOUS | 11 refills | Status: DC

## 2018-06-30 MED ORDER — INSULIN PEN NEEDLE 31G X 5 MM MISC: 6 refills | Status: DC

## 2018-06-30 NOTE — Progress Notes (Signed)
Name: Robert Chaney  MRN/ DOB: [id number], 07-23-1950   Age/ Sex: 68 y.o., male    PCP: [name], MD   Reason for Endocrinology Evaluation: Type 2 Diabetes Mellitus     Date of Initial Endocrinology Visit: 06/30/2018     PATIENT IDENTIFIER: Robert Chaney is a 68 y.o. male with a past medical history of T2DM, HTN, hypertrophic cardiomyopathy, and CKD III. The patient presented for initial endocrinology clinic visit on 06/30/2018 for consultative assistance with his diabetes management.    HPI: Robert Chaney was    Diagnosed with T2DM for 6 yrs  Prior Medications tried/Intolerance: Has been on insulin since diagnosis.   Currently checking blood sugars 3 x / day,  before meals Hypoglycemia episodes : no                Hemoglobin A1c has ranged from 6.9%  in 2015, peaking at 13.0%  In 2019. Patient required assistance for hypoglycemia: no Patient has required hospitalization within the last 1 year from hyper or hypoglycemia: in February , 2019  In terms of diet, the patient avoids sugar-sweetened beverages. Eats 2-3 meals day, sometimes snacks.    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     HOME DIABETES REGIMEN: Toujeo 35 units  Humalog - SS not taking (cost) Tradjenta 5 mg daily- not taking    Statin:Yes ACE-I/ARB: No Prior Diabetic Education: Yes   METER DOWNLOAD SUMMARY: Did not bring  This am 169 mg/dL  Yesterday after lunch was 143 mg/dL   DIABETIC COMPLICATIONS: Microvascular complications:   Neuropathy, CKD III, ?retinopathy on the left   Last eye exam: Completed 05/2018  Macrovascular complications:    Denies: CAD, PVD, CVA   PAST HISTORY: Past Medical History:  Past Medical History:  Diagnosis Date  . Altered mental status    a. 05/2017 - adm with blurred vision, somnolence in setting of AKI and high blood sugar.  . Anemia   . CKD (chronic kidney disease), stage III ([place])   . Diabetes mellitus   . Diastolic CHF, chronic (HCC)    A.   03/2009 Echo: EF 60-65%, Gr II diast dysfxn  . Elevated troponin    a. 2013 - troponin 1.4. b. 2019 - troponin 0.32; neg nuc 07/2017.  . Glaucoma   . Hyperlipidemia    HYPERCHOLESTEROLEMIA  . Hypertension    MARKED LEFT VENTRICULAR HYPERTROPHY BY PREVIOUS ECHOCARDIOGRAM--HE HAS HYPERDYNAMIC LEFT VENTRICULAR SYSTOLIC FUNCTION AND HAS IMPAIRED RELAXATION BY ECHO  . Hypertrophic cardiomyopathy ([facility])   . NSVT (nonsustained ventricular tachycardia) ([place])   . Premature atrial contractions   . PVC's (premature ventricular contractions)   . SVT (supraventricular tachycardia) (HCC)    Past Surgical History:  Past Surgical History:  Procedure Laterality Date  . NO PAST SURGERIES        Social History:  reports that he has never smoked. He has never used smokeless tobacco. He reports that he does not drink alcohol or use drugs. Family History:  Family History  Problem Relation Age of Onset  . Hypertension Father   . Diabetes Brother   . Diabetes Brother      HOME MEDICATIONS: Allergies as of 06/30/2018      Reactions   Aspirin Itching   itching      Medication List       Accurate as of June 30, 2018  9:13 AM. Always use your most recent med list.  amLODipine 10 MG tablet Commonly known as:  NORVASC Take 1 tablet (10 mg total) by mouth at bedtime.   atorvastatin 20 MG tablet Commonly known as:  LIPITOR Take 1 tablet (20 mg total) by mouth daily.   carvedilol 25 MG tablet Commonly known as:  COREG Take 1 tablet (25 mg total) by mouth 2 (two) times daily.   furosemide 20 MG tablet Commonly known as:  LASIX Take 1 tablet (20 mg total) by mouth daily.   hydrALAZINE 50 MG tablet Commonly known as:  APRESOLINE Take 1 tablet (50 mg total) by mouth 3 (three) times daily.   insulin lispro 100 UNIT/ML injection Commonly known as:  HUMALOG Inject 2-10 Units into the skin 3 (three) times daily before meals. Per sliding scale   isosorbide mononitrate 60 MG 24 hr  tablet Commonly known as:  IMDUR Take 1 tablet (60 mg total) by mouth daily.   linagliptin 5 MG Tabs tablet Commonly known as:  TRADJENTA Take 5 mg by mouth daily.   nitroGLYCERIN 0.4 MG SL tablet Commonly known as:  NITROSTAT Place 1 tablet (0.4 mg total) under the tongue every 5 (five) minutes as needed for chest pain.   TOUJEO MAX SOLOSTAR 300 UNIT/ML Sopn Generic drug:  Insulin Glargine Inject 40 Units into the skin at bedtime.        ALLERGIES: Allergies  Allergen Reactions  . Aspirin Itching    itching     REVIEW OF SYSTEMS: A comprehensive ROS was conducted with the patient and is negative except as per HPI and below:  Review of Systems  Constitutional: Negative for fever and weight loss.  HENT: Negative for congestion and sore throat.   Eyes: Positive for blurred vision. Negative for pain.  Respiratory: Negative for cough and shortness of breath.   Cardiovascular: Negative for chest pain and palpitations.  Gastrointestinal: Negative for diarrhea and nausea.  Genitourinary: Positive for frequency.  Musculoskeletal: Negative for back pain and myalgias.  Skin: Negative.   Neurological: Positive for tingling. Negative for tremors.  Endo/Heme/Allergies: Negative for polydipsia.  Psychiatric/Behavioral: Negative for depression. The patient is not nervous/anxious.       OBJECTIVE:   VITAL SIGNS: BP (!) 164/78 (BP Location: Left Arm, Patient Position: Sitting, Cuff Size: Normal)   Pulse 78   Ht 5' 9.6" (1.768 m)   Wt 195 lb (88.5 kg)   SpO2 96%   BMI 28.30 kg/m    PHYSICAL EXAM:  General: Pt appears well and is in NAD  Hydration: Well-hydrated with moist mucous membranes and good skin turgor  HEENT: Head: Unremarkable with good dentition. Oropharynx clear without exudate.  Eyes: External eye exam normal without stare, lid lag or exophthalmos.  EOM intact.  PERRL.  Neck: General: Supple without adenopathy or carotid bruits. Thyroid: Thyroid size normal.   No goiter or nodules appreciated. No thyroid bruit.  Lungs: Clear with good BS bilat with no rales, rhonchi, or wheezes  Heart: RRR with normal S1 and S2 and no gallops; no murmurs; no rub  Abdomen: Normoactive bowel sounds, soft, nontender, without masses or organomegaly palpable  Extremities:  Lower extremities - No pretibial edema. No lesions.  Skin: Normal texture and temperature to palpation. No rash noted. No Acanthosis nigricans/skin tags. No lipohypertrophy.  Neuro: MS is good with appropriate affect, pt is alert and Ox3    DM foot exam:  The skin of the feet is intact without sores or ulcerations. Pt with thickened discolored toe nails The pedal pulses are +  on right and + on left. The sensation is intact to a screening 5.07, 10 gram monofilament bilaterally   DATA REVIEWED:  Results for TAREEK, SABO (MRN [id number]) as of 06/30/2018 15:01  Ref. Range 06/30/2018 09:47  Sodium Latest Ref Range: 135 - 145 mEq/L 135  Potassium Latest Ref Range: 3.5 - 5.1 mEq/L 4.0  Chloride Latest Ref Range: 96 - 112 mEq/L 99  CO2 Latest Ref Range: 19 - 32 mEq/L 31  Glucose Latest Ref Range: 70 - 99 mg/dL 152 (H)  BUN Latest Ref Range: 6 - 23 mg/dL 35 (H)  Creatinine Latest Ref Range: 0.40 - 1.50 mg/dL 1.81 (H)  Calcium Latest Ref Range: 8.4 - 10.5 mg/dL 9.8  GFR Latest Ref Range: >60.00 mL/min 47.99 (L)   In-office BG is 152 mg/dL   ASSESSMENT / PLAN / RECOMMENDATIONS:   1) Type 2 Diabetes Mellitus, controlled, With CKD III, and neuropathic complications -   Plan: GENERAL: I have discussed with the patient the pathophysiology of diabetes. We went over the natural progression of the disease. We talked about both insulin resistance and insulin deficiency. We stressed the importance of lifestyle changes including diet and exercise. I explained the complications associated with diabetes including retinopathy, nephropathy, neuropathy as well as increased risk of cardiovascular disease. We went  over the benefit seen with glycemic control.    I explained to the patient that diabetic patients are at higher than normal risk for amputations. The patient was informed that diabetes is the number one cause of non-traumatic amputations in [place].  Discussed pharmacokinetics of basal/bolus insulin and the importance of taking prandial insulin with meals.   We also discussed avoiding sugar-sweetened beverages and snacks, when possible.   Pt has been dependent only on his basal insulin for diabetes control. I discussed with him that he will need to also start taking prandial insulin with his meals.   Due to CKD III, we are limited in oral glycemic agents. He is unable to afford Linagliptin.   MEDICATIONS:  Stop Linagliptin  Decrease Toujeo to 25 units daily   Start Novolog 6 units [facility]  EDUCATION / INSTRUCTIONS:  BG monitoring instructions: Patient is instructed to check his blood sugars 4 times a day, before meals and bedtime.  Call [facility] clinic if: BG persistently < 70 or > 300. . I reviewed the Rule of 15 for the treatment of hypoglycemia in detail with the patient. Literature supplied.   2) Diabetic complications:   Eye: Unsure if he has diabetic retinopathy. He has cataract and glaucoma  Neuro/ Feet: Does  have known diabetic peripheral neuropathy.  Renal: Patient does  have known baseline CKD. He is not  on an ACEI/ARB at present.   3) Lipids: Patient is on a statin.    4) Hypertension: He is above goal of < 140/90 mmHg. Will defer further management to his PCP.    F/u in 3 weeks with glucose log     Signed electronically by: Mack Guise, MD  [facility]  [address] [place], [place] [address] Phone: [phone] FAX: [phone]   CC: [name], [place] [address] Phone: [phone]  Fax: [phone]    Return to Endocrinology clinic as  below: No future appointments.

## 2018-06-30 NOTE — Patient Instructions (Addendum)
-   Check sugar before meals and bedtime - Decrease Toujeo to 25 units daily - Start Novolog at 6 units with each meal  - Avoid snacks between the meal but if you absolutely have to eat something between the meals , choose from the following   Choose healthy, lower carb lower calorie snacks: toss salad, cooked vegetables, cottage cheese, peanut butter, low fat cheese / string cheese, lower sodium deli meat, tuna salad or chicken salad.    HOW TO TREAT LOW BLOOD SUGARS (Blood sugar LESS THAN 70 MG/DL)  Please follow the RULE OF 15 for the treatment of hypoglycemia treatment (when your (blood sugars are less than 70 mg/dL)    STEP 1: Take 15 grams of carbohydrates when your blood sugar is low, which includes:   3-4 GLUCOSE TABS  OR  3-4 OZ OF JUICE OR REGULAR SODA OR  ONE TUBE OF GLUCOSE GEL     STEP 2: RECHECK blood sugar in 15 MINUTES STEP 3: If your blood sugar is still low at the 15 minute recheck --> then, go back to STEP 1 and treat AGAIN with another 15 grams of carbohydrates.

## 2018-07-01 MED ORDER — INSULIN ASPART 100 UNIT/ML FLEXPEN: 6.0000 [IU] | PEN_INJECTOR | Freq: Three times a day (TID) | SUBCUTANEOUS | 11 refills | Status: DC

## 2018-07-01 NOTE — Addendum Note (Signed)
Addended by: Dorita Sciara on: 07/01/2018 01:03 PM   Modules accepted: Orders

## 2018-07-03 DIAGNOSIS — I10 Essential (primary) hypertension: Secondary | ICD-10-CM | POA: Diagnosis not present

## 2018-07-03 DIAGNOSIS — I509 Heart failure, unspecified: Secondary | ICD-10-CM | POA: Diagnosis not present

## 2018-07-03 DIAGNOSIS — E1122 Type 2 diabetes mellitus with diabetic chronic kidney disease: Secondary | ICD-10-CM | POA: Diagnosis not present

## 2018-07-24 ENCOUNTER — Ambulatory Visit: Payer: PPO | Admitting: Internal Medicine

## 2018-08-14 DIAGNOSIS — H318 Other specified disorders of choroid: Secondary | ICD-10-CM | POA: Diagnosis not present

## 2018-08-14 DIAGNOSIS — H34831 Tributary (branch) retinal vein occlusion, right eye, with macular edema: Secondary | ICD-10-CM | POA: Diagnosis not present

## 2018-08-21 DIAGNOSIS — H401112 Primary open-angle glaucoma, right eye, moderate stage: Secondary | ICD-10-CM | POA: Diagnosis not present

## 2018-08-24 ENCOUNTER — Ambulatory Visit: Payer: PPO | Admitting: Internal Medicine

## 2018-08-24 NOTE — Progress Notes (Deleted)
Name: Robert Chaney  Age/ Sex: 68 y.o., male   MRN/ DOB: [id number], 1951/01/08     PCP: [name], MD   Reason for Endocrinology Evaluation: Type 2 Diabetes Mellitus  Initial Endocrine Consultative Visit: 06/30/2018    PATIENT IDENTIFIER: Robert Chaney is a 68 y.o. male with a past medical history of T2DM, HTN, hypertrophic cardiomyopathy, and CKD III. The patient has followed with Endocrinology clinic since 06/30/2018 for consultative assistance with management of his diabetes.  DIABETIC HISTORY:  Robert Chaney was diagnosed with T2DM in ~ 2014, he has been on basal insulin since his diagnosis. His hemoglobin A1c has ranged from 6.9%  in 2015, peaking at 13.0%  In 2019. .  Prandial insulin added in 06/2018 SUBJECTIVE:   During the last visit (06/30/2018): We stopped linagliptin, we decreased toujeo to 25 units and started novolog at 6 units   Today (08/24/2018): Robert Chaney is here for a 4 week follow up on his diabetes management. He checks his blood sugars *** times daily, preprandial to breakfast and ***. The patient has *** had hypoglycemic episodes since the last clinic visit, which typically occur *** x / - most often occuring ***. The patient is *** symptomatic with these episodes, with symptoms of {symptoms; hypoglycemia:9084048}. Otherwise, the patient has not required any recent emergency interventions for hypoglycemia and has not had recent hospitalizations secondary to hyper or hypoglycemic episodes.    ROS: As per HPI and as detailed below: ROS    HOME DIABETES REGIMEN:  Toujeo 25 units daily  Novolog 6 units TIDQAC    METER DOWNLOAD SUMMARY: Date range evaluated: *** Fingerstick Blood Glucose Tests = *** Average Number Tests/Day = *** Overall Mean FS Glucose = *** Standard Deviation = ***  BG Ranges: Low = *** High = ***   Hypoglycemic Events/30 Days: BG < 50 = *** Episodes of  symptomatic severe hypoglycemia = ***   HISTORY:  Past Medical History:  Past Medical History:  Diagnosis Date  . Altered mental status    a. 05/2017 - adm with blurred vision, somnolence in setting of AKI and high blood sugar.  . Anemia   . CKD (chronic kidney disease), stage III ([place])   . Diabetes mellitus   . Diastolic CHF, chronic (HCC)    A.  03/2009 Echo: EF 60-65%, Gr II diast dysfxn  . Elevated troponin    a. 2013 - troponin 1.4. b. 2019 - troponin 0.32; neg nuc 07/2017.  . Glaucoma   . Hyperlipidemia    HYPERCHOLESTEROLEMIA  . Hypertension    MARKED LEFT VENTRICULAR HYPERTROPHY BY PREVIOUS ECHOCARDIOGRAM--HE HAS HYPERDYNAMIC LEFT VENTRICULAR SYSTOLIC FUNCTION AND HAS IMPAIRED RELAXATION BY ECHO  . Hypertrophic cardiomyopathy ([place])   . NSVT (nonsustained ventricular tachycardia) ([place])   . Premature atrial contractions   . PVC's (premature ventricular contractions)   . SVT (supraventricular tachycardia) (HCC)     Past Surgical History:  Past Surgical History:  Procedure Laterality Date  . NO PAST SURGERIES       Social History:  reports that he has never smoked. He has never used smokeless tobacco. He reports that he does not drink alcohol or use drugs. Family History:  Family History  Problem Relation Age of Onset  . Hypertension Father   . Diabetes Brother   . Diabetes Brother       HOME MEDICATIONS: Allergies as of 08/24/2018      Reactions   Aspirin Itching   itching  Medication List       Accurate as of August 24, 2018 10:53 AM. Always use your most recent med list.        amLODipine 10 MG tablet Commonly known as:  NORVASC Take 1 tablet (10 mg total) by mouth at bedtime.   atorvastatin 20 MG tablet Commonly known as:  LIPITOR Take 1 tablet (20 mg total) by mouth daily.   carvedilol 25 MG tablet Commonly known as:  COREG Take 1 tablet (25 mg total) by mouth 2 (two) times daily.   furosemide 20 MG tablet Commonly known as:   LASIX Take 1 tablet (20 mg total) by mouth daily.   hydrALAZINE 50 MG tablet Commonly known as:  APRESOLINE Take 1 tablet (50 mg total) by mouth 3 (three) times daily.   insulin aspart 100 UNIT/ML FlexPen Commonly known as:  NOVOLOG FLEXPEN Inject 6 Units into the skin 3 (three) times daily with meals.   Insulin Pen Needle 31G X 5 MM Misc Commonly known as:  B-D UF III MINI PEN NEEDLES Four times daily   isosorbide mononitrate 60 MG 24 hr tablet Commonly known as:  IMDUR Take 1 tablet (60 mg total) by mouth daily.   nitroGLYCERIN 0.4 MG SL tablet Commonly known as:  NITROSTAT Place 1 tablet (0.4 mg total) under the tongue every 5 (five) minutes as needed for chest pain.   TOUJEO MAX SOLOSTAR 300 UNIT/ML Sopn Generic drug:  Insulin Glargine Inject 25 Units into the skin at bedtime.        OBJECTIVE:   Vital Signs: There were no vitals taken for this visit.  Wt Readings from Last 3 Encounters:  06/30/18 195 lb (88.5 kg)  03/16/18 189 lb 12.8 oz (86.1 kg)  09/15/17 171 lb 12.8 oz (77.9 kg)     Exam: General: Pt appears well and is in NAD  Neck: General: Supple without adenopathy. Thyroid: Thyroid size normal.  No goiter or nodules appreciated. No thyroid bruit.  Lungs: Clear with good BS bilat with no rales, rhonchi, or wheezes  Heart: RRR with normal S1 and S2 and no gallops; no murmurs; no rub  Abdomen: Normoactive bowel sounds, soft, nontender, without masses or organomegaly palpable  Extremities: No pretibial edema. No tremor. Normal strength and motion throughout. See detailed diabetic foot exam below.  Skin: Normal texture and temperature to palpation. No rash noted. No Acanthosis nigricans/skin tags. No lipohypertrophy.  Neuro: MS is good with appropriate affect, pt is alert and Ox3      DM Foot Exam 06/30/2018     The skin of the feet is intact without sores or ulcerations. Pt with thickened discolored toe nails The pedal pulses are + on right and + on  left. The sensation is intact to a screening 5.07, 10 gram monofilament bilaterally     DATA REVIEWED:  Lab Results  Component Value Date   LDLCALC 82 11/07/2016   CREATININE 1.81 (H) 06/30/2018          ASSESSMENT / PLAN / RECOMMENDATIONS:   1) Type 2 Diabetes Mellitus, Poorly controlled, With CKD III and neuropathic complications - Most recent A1c of *** %. Goal A1c < 7.0 %.    Plan:   MEDICATIONS:  ***  EDUCATION / INSTRUCTIONS:  BG monitoring instructions: Patient is instructed to check his blood sugars *** times a day, ***.  Call [place] [facility] clinic if: BG persistently < 70 or > 300. . I reviewed the Rule of 15 for the treatment of  hypoglycemia in detail with the patient. Literature supplied.    F/U in ***    Signed electronically by: Mack Guise, MD  [facility] Endocrinology  [facility] [address] [place], [place] [address] Phone: [phone] FAX: [phone]   CC: [name], [place] [address] Phone: [phone]  Fax: [phone]  Return to Endocrinology clinic as below: Future Appointments  Date Time Provider [place]  08/24/2018  1:40 PM , Melanie Crazier, MD [facility] None  10/15/2018  9:40 AM Dorothy Spark, MD [facility]

## 2018-08-28 DIAGNOSIS — I10 Essential (primary) hypertension: Secondary | ICD-10-CM | POA: Diagnosis not present

## 2018-08-28 DIAGNOSIS — E1122 Type 2 diabetes mellitus with diabetic chronic kidney disease: Secondary | ICD-10-CM | POA: Diagnosis not present

## 2018-08-28 DIAGNOSIS — Z125 Encounter for screening for malignant neoplasm of prostate: Secondary | ICD-10-CM | POA: Diagnosis not present

## 2018-08-28 DIAGNOSIS — Z0000 Encounter for general adult medical examination without abnormal findings: Secondary | ICD-10-CM | POA: Diagnosis not present

## 2018-08-28 DIAGNOSIS — I509 Heart failure, unspecified: Secondary | ICD-10-CM | POA: Diagnosis not present

## 2018-08-28 DIAGNOSIS — E7849 Other hyperlipidemia: Secondary | ICD-10-CM | POA: Diagnosis not present

## 2018-08-31 ENCOUNTER — Encounter: Payer: Self-pay | Admitting: Internal Medicine

## 2018-08-31 ENCOUNTER — Ambulatory Visit (INDEPENDENT_AMBULATORY_CARE_PROVIDER_SITE_OTHER): Payer: PPO | Admitting: Internal Medicine

## 2018-08-31 VITALS — BP 168/86 | HR 78 | Ht 69.6 in | Wt 195.2 lb

## 2018-08-31 DIAGNOSIS — E11319 Type 2 diabetes mellitus with unspecified diabetic retinopathy without macular edema: Secondary | ICD-10-CM

## 2018-08-31 DIAGNOSIS — E1142 Type 2 diabetes mellitus with diabetic polyneuropathy: Secondary | ICD-10-CM | POA: Diagnosis not present

## 2018-08-31 DIAGNOSIS — E1122 Type 2 diabetes mellitus with diabetic chronic kidney disease: Secondary | ICD-10-CM

## 2018-08-31 DIAGNOSIS — N183 Chronic kidney disease, stage 3 unspecified: Secondary | ICD-10-CM

## 2018-08-31 DIAGNOSIS — Z794 Long term (current) use of insulin: Secondary | ICD-10-CM

## 2018-08-31 DIAGNOSIS — E118 Type 2 diabetes mellitus with unspecified complications: Secondary | ICD-10-CM | POA: Diagnosis not present

## 2018-08-31 LAB — POCT GLYCOSYLATED HEMOGLOBIN (HGB A1C): HEMOGLOBIN A1C: 7.3 % — AB (ref 4.0–5.6)

## 2018-08-31 LAB — GLUCOSE, POCT (MANUAL RESULT ENTRY): POC Glucose: 176 mg/dl — AB (ref 70–99)

## 2018-08-31 NOTE — Patient Instructions (Signed)
-   Check sugar before meals and bedtime - Toujeo to 25 units daily - Novolog at 6 units with each meal  - Avoid snacks between the meal but if you absolutely have to eat something between the meals , choose from the following   Choose healthy, lower carb lower calorie snacks: toss salad, cooked vegetables, cottage cheese, peanut butter, low fat cheese / string cheese, lower sodium deli meat, tuna salad or chicken salad.    HOW TO TREAT LOW BLOOD SUGARS (Blood sugar LESS THAN 70 MG/DL)  Please follow the RULE OF 15 for the treatment of hypoglycemia treatment (when your (blood sugars are less than 70 mg/dL)    STEP 1: Take 15 grams of carbohydrates when your blood sugar is low, which includes:   3-4 GLUCOSE TABS  OR  3-4 OZ OF JUICE OR REGULAR SODA OR  ONE TUBE OF GLUCOSE GEL     STEP 2: RECHECK blood sugar in 15 MINUTES STEP 3: If your blood sugar is still low at the 15 minute recheck --> then, go back to STEP 1 and treat AGAIN with another 15 grams of carbohydrates.

## 2018-08-31 NOTE — Progress Notes (Signed)
Name: Robert Chaney  Age/ Sex: 68 y.o., male   MRN/ DOB: [id number], 1950-07-09     PCP: [name], MD   Reason for Endocrinology Evaluation: Type 2 Diabetes Mellitus  Initial Endocrine Consultative Visit: 06/30/2018    PATIENT IDENTIFIER: [name] is a 68 y.o. male with a past medical history of T2DM, HTN, hypertrophic cardiomyopathy, and CKD III. The patient has followed with Endocrinology clinic since 06/30/2018 for consultative assistance with management of his diabetes.  DIABETIC HISTORY:  Robert Chaney was diagnosed with T2DM Since 2014, started insulin therapy at diagnosis. His hemoglobin A1c has ranged from 6.9%  in 2015, peaking at 13.0%  In 2019.   SUBJECTIVE:   During the last visit (06/30/2018): Linagliptin was discontinued due to cost, Toujeo was reduced from 35 units to 25 units daily, he was started on prandial insulin at 6 units with each meal.  Today (08/31/2018): Robert Chaney is here for 72-month follow-up on his diabetes management. He checks his blood sugars 3  times daily, preprandial. The patient has not had hypoglycemic episodes since the last clinic visit. Otherwise, the patient has not required any recent emergency interventions for hypoglycemia and has not had recent hospitalizations secondary to hyper or hypoglycemic episodes.    ROS: As per HPI and as detailed below: Review of Systems  Constitutional: Negative for fever.  HENT: Negative for congestion and sore throat.   Respiratory: Negative for cough and shortness of breath.   Cardiovascular: Negative for chest pain and palpitations.  Gastrointestinal: Negative for diarrhea and nausea.      HOME DIABETES REGIMEN:   Toujeo to 25 units daily   Novolog 6 units TIDQAC     METER DOWNLOAD SUMMARY: Did not bring meter     HISTORY:  Past Medical History:  Past Medical History:  Diagnosis Date  . Altered mental status      a. 05/2017 - adm with blurred vision, somnolence in setting of AKI and high blood sugar.  . Anemia   . CKD (chronic kidney disease), stage III ([place])   . Diabetes mellitus   . Diastolic CHF, chronic (HCC)    A.  03/2009 Echo: EF 60-65%, Gr II diast dysfxn  . Elevated troponin    a. 2013 - troponin 1.4. b. 2019 - troponin 0.32; neg nuc 07/2017.  . Glaucoma   . Hyperlipidemia    HYPERCHOLESTEROLEMIA  . Hypertension    MARKED LEFT VENTRICULAR HYPERTROPHY BY PREVIOUS ECHOCARDIOGRAM--HE HAS HYPERDYNAMIC LEFT VENTRICULAR SYSTOLIC FUNCTION AND HAS IMPAIRED RELAXATION BY ECHO  . Hypertrophic cardiomyopathy ([place])   . NSVT (nonsustained ventricular tachycardia) ([place])   . Premature atrial contractions   . PVC's (premature ventricular contractions)   . SVT (supraventricular tachycardia) (HCC)    Past Surgical History:  Past Surgical History:  Procedure Laterality Date  . NO PAST SURGERIES      Social History:  reports that he has never smoked. He has never used smokeless tobacco. He reports that he does not drink alcohol or use drugs. Family History:  Family History  Problem Relation Age of Onset  . Hypertension Father   . Diabetes Brother   . Diabetes Brother      HOME MEDICATIONS: Allergies as of 08/31/2018      Reactions   Aspirin Itching   itching      Medication List       Accurate as of August 31, 2018  8:44 AM. Always use your most recent med list.  amLODipine 10 MG tablet Commonly known as:  NORVASC Take 1 tablet (10 mg total) by mouth at bedtime.   atorvastatin 20 MG tablet Commonly known as:  LIPITOR Take 1 tablet (20 mg total) by mouth daily.   carvedilol 25 MG tablet Commonly known as:  COREG Take 1 tablet (25 mg total) by mouth 2 (two) times daily.   furosemide 20 MG tablet Commonly known as:  LASIX Take 1 tablet (20 mg total) by mouth daily.   hydrALAZINE 50 MG tablet Commonly known as:  APRESOLINE Take 1 tablet (50 mg total) by mouth 3 (three)  times daily.   insulin aspart 100 UNIT/ML FlexPen Commonly known as:  NovoLOG FlexPen Inject 6 Units into the skin 3 (three) times daily with meals.   Insulin Pen Needle 31G X 5 MM Misc Commonly known as:  B-D UF III MINI PEN NEEDLES Four times daily   isosorbide mononitrate 60 MG 24 hr tablet Commonly known as:  IMDUR Take 1 tablet (60 mg total) by mouth daily.   nitroGLYCERIN 0.4 MG SL tablet Commonly known as:  NITROSTAT Place 1 tablet (0.4 mg total) under the tongue every 5 (five) minutes as needed for chest pain.   Toujeo Max SoloStar 300 UNIT/ML Sopn Generic drug:  Insulin Glargine Inject 25 Units into the skin at bedtime.        OBJECTIVE:   Vital Signs: BP (!) 168/86 (BP Location: Left Arm, Patient Position: Sitting, Cuff Size: Normal)   Pulse 78   Ht 5' 9.6" (1.768 m)   Wt 195 lb 3.2 oz (88.5 kg)   SpO2 94%   BMI 28.33 kg/m   Wt Readings from Last 3 Encounters:  08/31/18 195 lb 3.2 oz (88.5 kg)  06/30/18 195 lb (88.5 kg)  03/16/18 189 lb 12.8 oz (86.1 kg)     Exam: General: Pt appears well and is in NAD  Lungs: Clear with good BS bilat with no rales, rhonchi, or wheezes  Heart: RRR with normal S1 and S2 and no gallops; no murmurs; no rub  Abdomen: Normoactive bowel sounds, soft, nontender, without masses or organomegaly palpable  Skin: Normal texture and temperature to palpation. No rash noted. No Acanthosis nigricans/skin tags. No lipohypertrophy.  Neuro: MS is good with appropriate affect, pt is alert and Ox3       DM foot exam: 06/30/2018 The skin of the feet is intact without sores or ulcerations. Pt with thickened discolored toe nails The pedal pulses are + on right and + on left. The sensation is intact to a screening 5.07, 10 gram monofilament bilaterally         DATA REVIEWED:  Lab Results  Component Value Date   HGBA1C 7.3 (A) 08/31/2018   HGBA1C 6.9 (H) 09/09/2013   HGBA1C 12.0 (H) 07/19/2011   Lab Results  Component Value Date    LDLCALC 82 11/07/2016   CREATININE 1.81 (H) 06/30/2018    ASSESSMENT / PLAN / RECOMMENDATIONS:   1) Type 2 diabetes Mellitus, poorly controlled, With CKD III, retinopathy, and neuropathic complications - Most recent A1c of 7.3 %. Goal A1c < 7.0 %.    Plan:  -Patient is very pleased with his glucose readings, patient states since starting prandial insulin his glucose has been less than 200 mg/dL, historically he was used to the 400 g/dL.. -Praised the patient on compliance with insulin intake. -We also discussed the importance of glucose data availability, patient did not bring meter today. -Patient does not have a personal  or family history of medullary thyroid cancer, he does not have history of pancreatitis, we discussed adding a GLP-1 agonist to see if we can reduce his insulin doses more, but the patient is satisfied with his current regimen and would like to continue.  MEDICATIONS:  Continue Toujeo 25 units daily  Continue NovoLog 6 units 3 times daily before every meal C  EDUCATION / INSTRUCTIONS:  BG monitoring instructions: Patient is instructed to check his blood sugars 3 times a day, before meals and bedtime.  Call [place] [facility]ocrinology clinic if: BG persistently < 70 or > 300. . I reviewed the Rule of 15 for the treatment of hypoglycemia in detail with the patient. Literature supplied.   2) Hypertension: He is above goal of < 140/90 mmHg.  Will defer further management to his PCP   F/U in 3 months   Signed electronically by: Mack Guise, MD  [facility] Endocrinology  [facility] [address] [place], [place] [address] Phone: [phone] FAX: [phone]   CC: [name], [place] [address] Phone: [phone]  Fax: [phone]  Return to Endocrinology clinic as below: Future Appointments  Date Time Provider [place]  09/11/2018 10:30 AM Clydell Hakim, RD [place] NDM  10/15/2018   9:40 AM Dorothy Spark, MD [facility]

## 2018-09-11 ENCOUNTER — Encounter: Payer: PPO | Attending: Internal Medicine | Admitting: Dietician

## 2018-09-11 ENCOUNTER — Encounter: Payer: Self-pay | Admitting: Dietician

## 2018-09-11 ENCOUNTER — Other Ambulatory Visit: Payer: Self-pay

## 2018-09-11 DIAGNOSIS — E118 Type 2 diabetes mellitus with unspecified complications: Secondary | ICD-10-CM | POA: Diagnosis not present

## 2018-09-11 NOTE — Progress Notes (Signed)
Diabetes Self-Management Education  Visit Type: First/Initial  Appt. Start Time: 1030 Appt. End Time: 1130  09/11/2018  [name], identified by name and date of birth, is a 68 y.o. male with a diagnosis of Diabetes: Type 2.   ASSESSMENT History includes HTN, CKD3, HTN, hypertrophic cardiomyopathy, and type 2 diabetes (since 2014 and insulin since diagnosis). He states that he feels his diet needs to be changed.  He uses a lot of oil in cooking, chicken skin on wings, increased amounts of rice (although brown).  He snacks out of habit.  Regular syrup on pancakes.  Patient lives alone.  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  Originally from [place]    Height 5\' 8"  (1.727 m), weight 193 lb (87.5 kg). Body mass index is 29.35 kg/m.  Diabetes Self-Management Education - 09/11/18 1045      Visit Information   Visit Type  First/Initial      Initial Visit   Diabetes Type  Type 2    Are you currently following a meal plan?  Yes    What type of meal plan do you follow?  diabetic    Are you taking your medications as prescribed?  Yes    Date Diagnosed  2014   insulin since diagnosis     Health Coping   How would you rate your overall health?  Fair      Psychosocial Assessment   Patient Belief/Attitude about Diabetes  Motivated to manage diabetes    Self-care barriers  None    Self-management support  Doctor's office    Other persons present  Patient    Patient Concerns  Nutrition/Meal planning;Glycemic Control    Special Needs  None    Preferred Learning Style  No preference indicated    Learning Readiness  Ready    How often do you need to have someone help you when you read instructions, pamphlets, or other written materials from your doctor or pharmacy?  3 - Sometimes    What is the last grade level you completed in school?  Technical college      Pre-Education Assessment   Patient understands the diabetes  disease and treatment process.  Needs Review    Patient understands incorporating nutritional management into lifestyle.  Needs Review    Patient undertands incorporating physical activity into lifestyle.  Needs Review    Patient understands using medications safely.  Needs Review    Patient understands monitoring blood glucose, interpreting and using results  Needs Review    Patient understands prevention, detection, and treatment of acute complications.  Needs Review    Patient understands prevention, detection, and treatment of chronic complications.  Needs Review    Patient understands how to develop strategies to address psychosocial issues.  Needs Review    Patient understands how to develop strategies to promote health/change behavior.  Needs Review      Complications   Last HgB A1C per patient/outside source  7.3 %   08/31/2018 decreased from 2019   How often do you check your blood sugar?  3-4 times/day    Fasting Blood glucose range (mg/dL)  70-129    Postprandial Blood glucose range (mg/dL)  130-179    Number of hypoglycemic episodes per month  0    Number of hyperglycemic episodes per week  0    Have you had a dilated eye exam in the past 12 months?  Yes    Have you had a dental exam in the past 12 months?  Yes    Are you checking your feet?  No      Dietary Intake   Breakfast  2 waffles (with regular syrup), scrambled egg, and sometimes salad with thousand [employer] OR brown rice, onions, pepper and other vegetables, chicken wings OR PB stew, occasional fruit    Snack (afternoon)  wheat crackers or pretzels    Dinner  salad with chicken, beans, baked beans    Snack (evening)  occasional wheat crackers or pretzels    Beverage(s)  (several cups) coffee with splend, powdered creamer, water      Exercise   Exercise Type  Light (walking / raking leaves)   weights, streches   How many days per week to you exercise?  3    How many minutes per day do you  exercise?  15    Total minutes per week of exercise  45      Patient Education   Previous Diabetes Education  Yes (please comment)   2 years ago while in the hospital   Disease state   Definition of diabetes, type 1 and 2, and the diagnosis of diabetes   review   Nutrition management   Role of diet in the treatment of diabetes and the relationship between the three main macronutrients and blood glucose level;Carbohydrate counting;Meal options for control of blood glucose level and chronic complications.;Food label reading, portion sizes and measuring food.    Physical activity and exercise   Role of exercise on diabetes management, blood pressure control and cardiac health.    Medications  Reviewed patients medication for diabetes, action, purpose, timing of dose and side effects.    Monitoring  Purpose and frequency of SMBG.;Identified appropriate SMBG and/or A1C goals.;Daily foot exams;Yearly dilated eye exam    Acute complications  Taught treatment of hypoglycemia - the 15 rule.    Chronic complications  Relationship between chronic complications and blood glucose control;Dental care    Personal strategies to promote health  Lifestyle issues that need to be addressed for better diabetes care      Individualized Goals (developed by patient)   Nutrition  Follow meal plan discussed;General guidelines for healthy choices and portions discussed    Physical Activity  30 minutes per day    Medications  take my medication as prescribed    Reducing Risk  Other (comment)   decrease fat intake   Health Coping  discuss diabetes with (comment)   RD, MD, CDE     Post-Education Assessment   Patient understands the diabetes disease and treatment process.  Demonstrates understanding / competency    Patient understands incorporating nutritional management into lifestyle.  Needs Review    Patient undertands incorporating physical activity into lifestyle.  Demonstrates understanding / competency     Patient understands using medications safely.  Demonstrates understanding / competency    Patient understands monitoring blood glucose, interpreting and using results  Demonstrates understanding / competency    Patient understands prevention, detection, and treatment of acute complications.  Demonstrates understanding / competency    Patient understands prevention, detection, and treatment of chronic complications.  Demonstrates understanding / competency    Patient understands how to develop strategies to address psychosocial issues.  Demonstrates understanding / competency    Patient understands how to develop strategies to promote health/change behavior.  Needs Review      Outcomes  Expected Outcomes  Demonstrated interest in learning. Expect positive outcomes    Future DMSE  3-4 months    Program Status  Completed       Individualized Plan for Diabetes Self-Management Training:   Learning Objective:  Patient will have a greater understanding of diabetes self-management. Patient education plan is to attend individual and/or group sessions per assessed needs and concerns.   Plan:   Patient Instructions  Avoid snacks.  If you are hungry, choose a small handful of unsalted nuts or raw vegetables. Choose sugar free syrup rather than regular. Consider reducing your rice portion to about 1 cup at lunch.   Choose more non starchy vegetables. Remove the skin from your chicken most often.   Bake rather than fry. Reduce the amount of oil in your cooking. Reduce the amount of salt in your cooking. Small amounts of salad dressing or choose a vinaigrette.   Stay active.  Aim for 30 minutes most days. (walking or other)     Expected Outcomes:  Demonstrated interest in learning. Expect positive outcomes  Education material provided: Food label handouts, Meal plan card, My Plate and low carb Snack sheet, breakfast ideas, How to Thrive:  A guide for your journey with diabetes  If problems  or questions, patient to contact team via:  Phone  Future DSME appointment: 3-4 months

## 2018-09-11 NOTE — Patient Instructions (Signed)
Avoid snacks.  If you are hungry, choose a small handful of unsalted nuts or raw vegetables. Choose sugar free syrup rather than regular. Consider reducing your rice portion to about 1 cup at lunch.   Choose more non starchy vegetables. Remove the skin from your chicken most often.   Bake rather than fry. Reduce the amount of oil in your cooking. Reduce the amount of salt in your cooking. Small amounts of salad dressing or choose a vinaigrette.   Stay active.  Aim for 30 minutes most days. (walking or other)

## 2018-09-30 DIAGNOSIS — I5043 Acute on chronic combined systolic (congestive) and diastolic (congestive) heart failure: Secondary | ICD-10-CM | POA: Diagnosis not present

## 2018-09-30 DIAGNOSIS — I10 Essential (primary) hypertension: Secondary | ICD-10-CM | POA: Diagnosis not present

## 2018-09-30 DIAGNOSIS — R0789 Other chest pain: Secondary | ICD-10-CM | POA: Diagnosis not present

## 2018-09-30 DIAGNOSIS — N183 Chronic kidney disease, stage 3 (moderate): Secondary | ICD-10-CM | POA: Diagnosis not present

## 2018-09-30 DIAGNOSIS — E785 Hyperlipidemia, unspecified: Secondary | ICD-10-CM | POA: Diagnosis not present

## 2018-09-30 DIAGNOSIS — Z79899 Other long term (current) drug therapy: Secondary | ICD-10-CM | POA: Diagnosis not present

## 2018-09-30 DIAGNOSIS — N179 Acute kidney failure, unspecified: Secondary | ICD-10-CM | POA: Diagnosis not present

## 2018-09-30 DIAGNOSIS — I110 Hypertensive heart disease with heart failure: Secondary | ICD-10-CM | POA: Diagnosis not present

## 2018-10-09 DIAGNOSIS — N2581 Secondary hyperparathyroidism of renal origin: Secondary | ICD-10-CM | POA: Diagnosis not present

## 2018-10-09 DIAGNOSIS — D631 Anemia in chronic kidney disease: Secondary | ICD-10-CM | POA: Diagnosis not present

## 2018-10-09 DIAGNOSIS — N183 Chronic kidney disease, stage 3 (moderate): Secondary | ICD-10-CM | POA: Diagnosis not present

## 2018-10-09 DIAGNOSIS — I129 Hypertensive chronic kidney disease with stage 1 through stage 4 chronic kidney disease, or unspecified chronic kidney disease: Secondary | ICD-10-CM | POA: Diagnosis not present

## 2018-10-15 ENCOUNTER — Ambulatory Visit: Payer: PPO | Admitting: Cardiology

## 2018-11-13 DIAGNOSIS — H401112 Primary open-angle glaucoma, right eye, moderate stage: Secondary | ICD-10-CM | POA: Diagnosis not present

## 2018-11-27 DIAGNOSIS — I509 Heart failure, unspecified: Secondary | ICD-10-CM | POA: Diagnosis not present

## 2018-11-27 DIAGNOSIS — I10 Essential (primary) hypertension: Secondary | ICD-10-CM | POA: Diagnosis not present

## 2018-11-27 DIAGNOSIS — E1122 Type 2 diabetes mellitus with diabetic chronic kidney disease: Secondary | ICD-10-CM | POA: Diagnosis not present

## 2018-11-27 DIAGNOSIS — N183 Chronic kidney disease, stage 3 (moderate): Secondary | ICD-10-CM | POA: Diagnosis not present

## 2018-12-04 ENCOUNTER — Other Ambulatory Visit: Payer: Self-pay

## 2018-12-04 ENCOUNTER — Encounter: Payer: PPO | Attending: Internal Medicine | Admitting: Dietician

## 2018-12-04 ENCOUNTER — Encounter: Payer: Self-pay | Admitting: Internal Medicine

## 2018-12-04 ENCOUNTER — Encounter: Payer: Self-pay | Admitting: Dietician

## 2018-12-04 ENCOUNTER — Ambulatory Visit (INDEPENDENT_AMBULATORY_CARE_PROVIDER_SITE_OTHER): Payer: PPO | Admitting: Internal Medicine

## 2018-12-04 VITALS — BP 148/82 | HR 65 | Temp 98.2°F | Ht 68.0 in | Wt 195.4 lb

## 2018-12-04 DIAGNOSIS — E1142 Type 2 diabetes mellitus with diabetic polyneuropathy: Secondary | ICD-10-CM

## 2018-12-04 DIAGNOSIS — E785 Hyperlipidemia, unspecified: Secondary | ICD-10-CM

## 2018-12-04 DIAGNOSIS — N183 Chronic kidney disease, stage 3 unspecified: Secondary | ICD-10-CM

## 2018-12-04 DIAGNOSIS — E118 Type 2 diabetes mellitus with unspecified complications: Secondary | ICD-10-CM

## 2018-12-04 DIAGNOSIS — Z794 Long term (current) use of insulin: Secondary | ICD-10-CM | POA: Diagnosis not present

## 2018-12-04 DIAGNOSIS — E11319 Type 2 diabetes mellitus with unspecified diabetic retinopathy without macular edema: Secondary | ICD-10-CM

## 2018-12-04 DIAGNOSIS — E1122 Type 2 diabetes mellitus with diabetic chronic kidney disease: Secondary | ICD-10-CM | POA: Diagnosis not present

## 2018-12-04 LAB — POCT GLYCOSYLATED HEMOGLOBIN (HGB A1C): Hemoglobin A1C: 5.9 % — AB (ref 4.0–5.6)

## 2018-12-04 MED ORDER — NOVOLOG FLEXPEN 100 UNIT/ML ~~LOC~~ SOPN: 5.0000 [IU] | PEN_INJECTOR | Freq: Three times a day (TID) | SUBCUTANEOUS | 11 refills | Status: DC

## 2018-12-04 NOTE — Progress Notes (Signed)
Name: Robert Chaney  Age/ Sex: 68 y.o., male   MRN/ DOB: [id number], [birth year]     PCP: [name], MD   Reason for Endocrinology Evaluation: Type 2 Diabetes Mellitus  Initial Endocrine Consultative Visit: 06/30/2018    PATIENT IDENTIFIER: Robert Chaney is a 68 y.o. male with a past medical history of T2DM, HTN, hypertrophic cardiomyopathy, and CKD III. The patient has followed with Endocrinology clinic since 06/30/2018 for consultative assistance with management of his diabetes.  DIABETIC HISTORY:  Robert Chaney was diagnosed with T2DM Since 2014, started insulin therapy at diagnosis. His hemoglobin A1c has ranged from 6.9%  in 2015, peaking at 13.0%  In 2019.  Linagliptin discontinued in 06/2018 due to cost    Saw Robert Chaney 08/2018 SUBJECTIVE:   During the last visit (08/31/2018): We continued Toujeo and Novolog dose.   Today (12/04/2018): Mr. Chio is here for 7-month follow-up on his diabetes management. He checks his blood sugars 3  times daily, preprandial. The patient has not had hypoglycemic episodes since the last clinic visit, but he did have a tight BG to 78 mg./dL with over-correction. Otherwise, the patient has not required any recent emergency interventions for hypoglycemia and has not had recent hospitalizations secondary to hyper or hypoglycemic episodes.    ROS: As per HPI and as detailed below: Review of Systems  Constitutional: Negative for fever.  HENT: Negative for congestion and sore throat.   Respiratory: Negative for cough and shortness of breath.   Cardiovascular: Negative for chest pain and palpitations.  Gastrointestinal: Negative for diarrhea and nausea.      HOME DIABETES REGIMEN:   Toujeo to 25 units daily   Novolog 6 units TIDQAC     METER DOWNLOAD SUMMARY: 5/13-6/04/2019 Fingerstick Blood Glucose Tests = 18 Overall Mean FS Glucose = 189.6 Standard Deviation  =68.4  BG Ranges: Low = 78 High = 354   Hypoglycemic Events/30 Days: BG < 50 = 0 Episodes of symptomatic severe hypoglycemia = 0    DIABETIC COMPLICATIONS: Microvascular complications:   Neuropathy, CKD III, ?retinopathy on the left   Last eye exam: Completed 05/2018  Macrovascular complications:    Denies: CAD, PVD, CVA  HISTORY:  Past Medical History:  Past Medical History:  Diagnosis Date  . Altered mental status    a. 05/2017 - adm with blurred vision, somnolence in setting of AKI and high blood sugar.  . Anemia   . CKD (chronic kidney disease), stage III ([place])   . Diabetes mellitus   . Diastolic CHF, chronic ([facility])    A.  03/2009 Echo: EF 60-65%, Gr II diast dysfxn  . Elevated troponin    a. 2013 - troponin 1.4. b. 2019 - troponin 0.32; neg nuc 07/2017.  . Glaucoma   . Hyperlipidemia    HYPERCHOLESTEROLEMIA  . Hypertension    MARKED LEFT VENTRICULAR HYPERTROPHY BY PREVIOUS ECHOCARDIOGRAM--HE HAS HYPERDYNAMIC LEFT VENTRICULAR SYSTOLIC FUNCTION AND HAS IMPAIRED RELAXATION BY ECHO  . Hypertrophic cardiomyopathy ([place])   . NSVT (nonsustained ventricular tachycardia) ([place])   . Premature atrial contractions   . PVC's (premature ventricular contractions)   . SVT (supraventricular tachycardia) (H[facility])    Past Surgical History:  Past Surgical History:  Procedure Laterality Date  . NO PAST SURGERIES      Social History:  reports that he has never smoked. He has never used smokeless tobacco. He reports that he does not drink alcohol or use drugs. Family History:  Family History  Problem Relation Age  of Onset  . Hypertension Father   . Diabetes Brother   . Diabetes Brother      HOME MEDICATIONS: Allergies as of 12/04/2018      Reactions   Aspirin Itching   itching      Medication List       Accurate as of December 04, 2018  8:29 AM. If you have any questions, ask your nurse or doctor.        amLODipine 10 MG tablet Commonly known as: NORVASC Take 1  tablet (10 mg total) by mouth at bedtime.   atorvastatin 20 MG tablet Commonly known as: LIPITOR Take 1 tablet (20 mg total) by mouth daily.   carvedilol 25 MG tablet Commonly known as: COREG Take 1 tablet (25 mg total) by mouth 2 (two) times daily.   cholecalciferol 25 MCG (1000 UT) tablet Commonly known as: VITAMIN D3 Take 1,000 Units by mouth daily.   furosemide 20 MG tablet Commonly known as: LASIX Take 1 tablet (20 mg total) by mouth daily.   hydrALAZINE 50 MG tablet Commonly known as: APRESOLINE Take 1 tablet (50 mg total) by mouth 3 (three) times daily.   insulin aspart 100 UNIT/ML FlexPen Commonly known as: NovoLOG FlexPen Inject 6 Units into the skin 3 (three) times daily with meals.   Insulin Pen Needle 31G X 5 MM Misc Commonly known as: B-D UF III MINI PEN NEEDLES Four times daily   isosorbide mononitrate 60 MG 24 hr tablet Commonly known as: IMDUR Take 1 tablet (60 mg total) by mouth daily.   nitroGLYCERIN 0.4 MG SL tablet Commonly known as: NITROSTAT Place 1 tablet (0.4 mg total) under the tongue every 5 (five) minutes as needed for chest pain.   Toujeo Max SoloStar 300 UNIT/ML Sopn Generic drug: Insulin Glargine Inject 25 Units into the skin at bedtime.        OBJECTIVE:   Vital Signs: BP (!) 148/82 (BP Location: Left Arm, Patient Position: Sitting, Cuff Size: Normal)   Pulse 65   Temp 98.2 F (36.8 C)   Ht 5\' 8"  (1.727 m)   Wt 195 lb 6.4 oz (88.6 kg)   SpO2 95%   BMI 29.71 kg/m   Wt Readings from Last 3 Encounters:  12/04/18 195 lb 6.4 oz (88.6 kg)  09/11/18 193 lb (87.5 kg)  08/31/18 195 lb 3.2 oz (88.5 kg)     Exam: General: Pt appears well and is in NAD  Lungs: Clear with good BS bilat with no rales, rhonchi, or wheezes  Heart: RRR with normal S1 and S2 and no gallops; no murmurs; no rub  Abdomen: Normoactive bowel sounds, soft, nontender, without masses or organomegaly palpable  Skin: Normal texture and temperature to palpation.  No rash noted. No Acanthosis nigricans/skin tags. No lipohypertrophy.  Neuro: MS is good with appropriate affect, pt is alert and Ox3       DM foot exam: 12/04/2018 The skin of the feet is intact without sores or ulcerations. Pt with thickened discolored toe nails The pedal pulses are 1 + on right and 1+ on left. The sensation is decreased on the left to a screening 5.07, 10 gram monofilament          DATA REVIEWED:  Lab Results  Component Value Date   HGBA1C 5.9 (A) 12/04/2018   HGBA1C 7.3 (A) 08/31/2018   HGBA1C 6.9 (H) 09/09/2013   Lab Results  Component Value Date   LDLCALC 82 11/07/2016   CREATININE 1.81 (H) 06/30/2018  ASSESSMENT / PLAN / RECOMMENDATIONS:   1) Type 2 diabetes Mellitus, With CKD III, retinopathy, and neuropathic complications - Most recent A1c of 5.9 %. Goal A1c < 7.0 %.    Plan:  - With an A1c of 5.9%, I am concerned about hypoglycemia. He alternates between 2 meters, so we didn't have a lot of readings on the one from today but he denies having BG's less then 70 mg/dL .Pts with CKD do have a skewed A1c towards to lower end.  - Based on some of his glucose readings, it seems that he skips his prandial insulin at times , hence BG 's in 200-300's. He is under the impression that if his pre-meal BG is < 150 mg./dL , he could ho,d off on using prandial insulin. I have advised him to use prandial insulin every time he is going to eat , unless his BG is less then 70 mg/dL.  -We have  discussed adding a GLP-1 agonist to see if we can reduce his insulin doses more in there past , but the patient is satisfied with his current regimen and would like to continue.  MEDICATIONS:  Continue Toujeo 25 units daily  Decrease NovoLog to 5 units 3 times daily before every meal C  EDUCATION / INSTRUCTIONS:  BG monitoring instructions: Patient is instructed to check his blood sugars 3 times a day, before meals and bedtime.  Call [facility] if: BG  persistently < 70 or > 300. . I reviewed the Rule of 15 for the treatment of hypoglycemia in detail with the patient. Literature supplied.    2) Diabetic complications:   Eye: Unsure if he has diabetic retinopathy. He has cataract and glaucoma  Neuro/ Feet: Does have known diabetic peripheral neuropathy.  Renal: Patient does  have known baseline CKD. He is not on an ACEI/ARB at present.   3) Lipids: Patient is on Lipitor 20 mg daily . His latest lipid profile not available to me at this time  4) Hypertension: He is above goal of < 140/90 mmHg.   F/U in 3 months   Signed electronically by: Mack Guise, MD  [facility]  [facility] [address] [place], [place] [address] Phone: [phone] FAX: [phone]   CC: [name], [place] [address] Phone: [phone]  Fax: [phone]  Return to Endocrinology clinic as below: Future Appointments  Date Time Provider [place]  12/04/2018  8:30 AM Kassia Demarinis, Melanie Crazier, MD [id number] None  12/04/2018  9:15 AM Valora Piccolo, Barnabas Lister, RD [place] [facility]

## 2018-12-04 NOTE — Patient Instructions (Addendum)
-   Continue Toujeo at 25 units daily - Decrease Novolog to 5  units with each meal     HOW TO TREAT LOW BLOOD SUGARS (Blood sugar LESS THAN 70 MG/DL)  Please follow the RULE OF 15 for the treatment of hypoglycemia treatment (when your (blood sugars are less than 70 mg/dL)    STEP 1: Take 15 grams of carbohydrates when your blood sugar is low, which includes:   3-4 GLUCOSE TABS  OR  3-4 OZ OF JUICE OR REGULAR SODA OR  ONE TUBE OF GLUCOSE GEL     STEP 2: RECHECK blood sugar in 15 MINUTES STEP 3: If your blood sugar is still low at the 15 minute recheck --> then, go back to STEP 1 and treat AGAIN with another 15 grams of carbohydrates.

## 2018-12-04 NOTE — Progress Notes (Signed)
Diabetes Self-Management Education  Visit Type: Follow-up  Appt. Start Time: 0930 Appt. End Time: 8366  12/06/2018  [name], identified by name and date of birth, is a 68 y.o. male with a diagnosis of Diabetes:  Type 2.  ASSESSMENT History includes HTN, CKD3, HTN, hypertrophic cardiomyopathy, and type 2 diabetes (since 2014 and insulin since diagnosis). He states that he feels his diet needs to be changed.  He uses a lot of oil in cooking, chicken skin on wings, increased amounts of rice (although brown).  He snacks out of habit.  Regular syrup on pancakes.  Since last visit 3 months ago, his A1C has decreased from 7.3% 09/10/18 to 5.9% 12/04/18.   Medications include Novolog  5 units before each meal (decreased form 6 to 5 units today), Toujeo 25 units q HS. He has stopped using syrup, decreased his sweet intake, decreased oil and decreased regular dressing and has cut snacking down a lot.  Patient lives alone.  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  Originally from [place]    Weight 195 lb (88.5 kg). Body mass index is 29.65 kg/m.  Diabetes Self-Management Education - 12/06/18 0753      Visit Information   Visit Type  Follow-up      Psychosocial Assessment   Other persons present  Patient    Patient Concerns  Nutrition/Meal planning    Special Needs  None      Pre-Education Assessment   Patient understands the diabetes disease and treatment process.  Demonstrates understanding / competency    Patient understands incorporating nutritional management into lifestyle.  Needs Review    Patient undertands incorporating physical activity into lifestyle.  Demonstrates understanding / competency    Patient understands using medications safely.  Demonstrates understanding / competency    Patient understands monitoring blood glucose, interpreting and using results  Demonstrates understanding / competency    Patient  understands prevention, detection, and treatment of acute complications.  Demonstrates understanding / competency    Patient understands prevention, detection, and treatment of chronic complications.  Demonstrates understanding / competency    Patient understands how to develop strategies to address psychosocial issues.  Demonstrates understanding / competency    Patient understands how to develop strategies to promote health/change behavior.  Needs Review      Complications   Last HgB A1C per patient/outside source  5.9 %    How often do you check your blood sugar?  1-2 times/day    Fasting Blood glucose range (mg/dL)  130-179      Dietary Intake   Breakfast  waffle with butter and 1-2 eggs    Snack (morning)  none    Lunch  spaghetti and meatballs with salad    Snack (afternoon)  none    Dinner  chicken or chicken nuggets, brown rice, vegetables    Beverage(s)  water      Exercise   Exercise Type  Light (walking / raking leaves)      Patient Education   Previous Diabetes Education  Yes (please comment)   3 months ago   Nutrition management   Role of diet in the treatment of diabetes and the relationship between the three main macronutrients and blood glucose level;Meal options for control of blood glucose level and chronic complications.    Physical activity and exercise   Role of exercise on diabetes management, blood pressure control and cardiac health.;Helped  patient identify appropriate exercises in relation to his/her diabetes, diabetes complications and other health issue.;Identified with patient nutritional and/or medication changes necessary with exercise.    Medications  Reviewed patients medication for diabetes, action, purpose, timing of dose and side effects.      Individualized Goals (developed by patient)   Nutrition  General guidelines for healthy choices and portions discussed    Physical Activity  Exercise 5-7 days per week    Medications  take my medication as  prescribed    Monitoring   test my blood glucose as discussed    Health Coping  discuss diabetes with (comment)   MD, RD,  CDE     Patient Self-Evaluation of Goals - Patient rates self as meeting previously set goals (% of time)   Nutrition  >75%    Physical Activity  50 - 75 %    Medications  >75%    Monitoring  >75%    Problem Solving  >75%    Reducing Risk  >75%    Health Coping  >75%      Post-Education Assessment   Patient understands the diabetes disease and treatment process.  Demonstrates understanding / competency    Patient understands incorporating nutritional management into lifestyle.  Needs Review    Patient undertands incorporating physical activity into lifestyle.  Demonstrates understanding / competency    Patient understands using medications safely.  Demonstrates understanding / competency    Patient understands monitoring blood glucose, interpreting and using results  Demonstrates understanding / competency    Patient understands prevention, detection, and treatment of acute complications.  Demonstrates understanding / competency    Patient understands prevention, detection, and treatment of chronic complications.  Demonstrates understanding / competency    Patient understands how to develop strategies to address psychosocial issues.  Demonstrates understanding / competency    Patient understands how to develop strategies to promote health/change behavior.  Needs Review      Outcomes   Expected Outcomes  Demonstrated interest in learning. Expect positive outcomes    Program Status  Completed      Subsequent Visit   Since your last visit have you continued or begun to take your medications as prescribed?  Yes    Since your last visit, are you checking your blood glucose at least once a day?  Yes       Individualized Plan for Diabetes Self-Management Training:   Learning Objective:  Patient will have a greater understanding of diabetes self-management. Patient  education plan is to attend individual and/or group sessions per assessed needs and concerns.   Plan:   Patient Instructions   Great Job on the changes you have made!  Not using regular syrup  Reducing your salt intake   Reducing your oil amounts  Cutting back on your snacks  Reducing the portion size on your salad dressing Choose unsalted nuts, raw vegetables or sugar free popsickle for a snack. Continue those changes Half of your plate should e non starchy vegetables Remove the skin from the chicken most often and bake rather than fry. Be active.  Walk or other exercise for 30 minutes most days. Call Dr. Kelton Pillar if your blood sugar is less than 70 2-3 times in one week. Continue to take your insulin and check your blood sugar as prescribed.   Expected Outcomes:  Demonstrated interest in learning. Expect positive outcomes  Education material provided:   If problems or questions, patient to contact team via:  Phone and Email  Future DSME appointment:  Patient to follow up after his next MD appointment in 3 months.

## 2018-12-04 NOTE — Patient Instructions (Addendum)
  Great Job on the changes you have made!  Not using regular syrup  Reducing your salt intake   Reducing your oil amounts  Cutting back on your snacks  Reducing the portion size on your salad dressing Choose unsalted nuts, raw vegetables or sugar free popsickle for a snack. Continue those changes Half of your plate should e non starchy vegetables Remove the skin from the chicken most often and bake rather than fry. Be active.  Walk or other exercise for 30 minutes most days. Call Dr. Kelton Pillar if your blood sugar is less than 70 2-3 times in one week. Continue to take your insulin and check your blood sugar as prescribed.

## 2018-12-28 IMAGING — US US RENAL
1 series · 14 of 25 positions shown · non-contrast
Comparison: 07/20/2011

CLINICAL DATA: Chronic renal disease

EXAM:
RENAL / URINARY TRACT ULTRASOUND COMPLETE

[Series 1: us renal · 0.26mm/px · 14 of 48 slices shown]
[im 1/48]
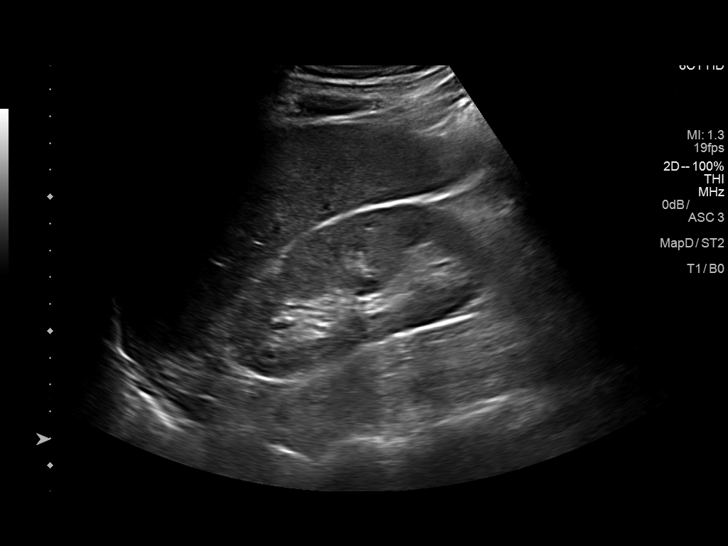
[im 4/48]
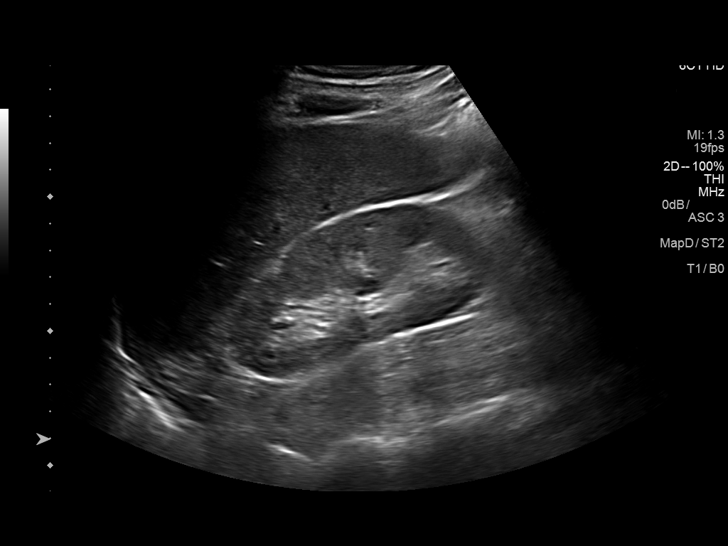
[im 8/48]
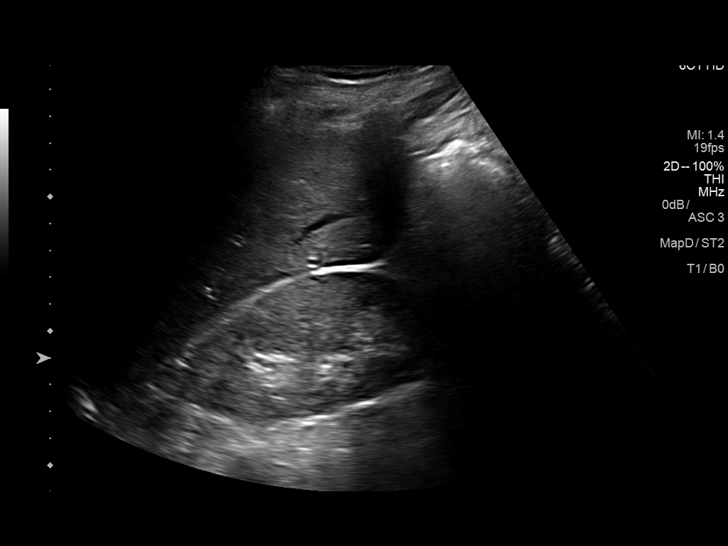
[im 12/48]
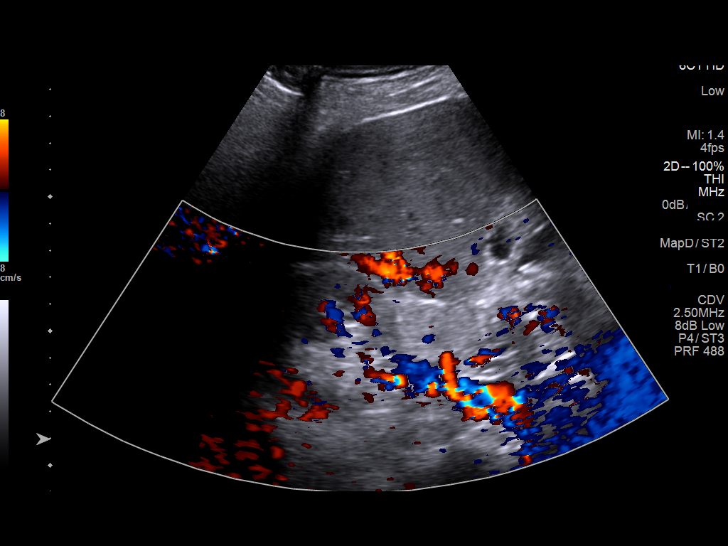
[im 16/48]
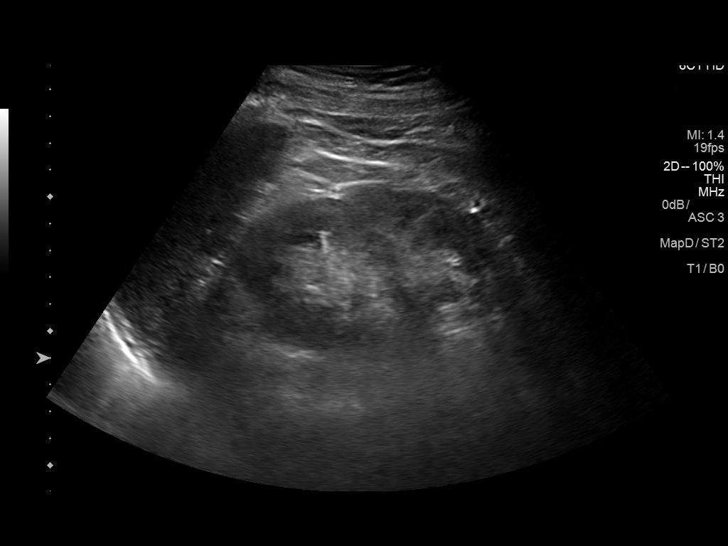
[im 18/48]
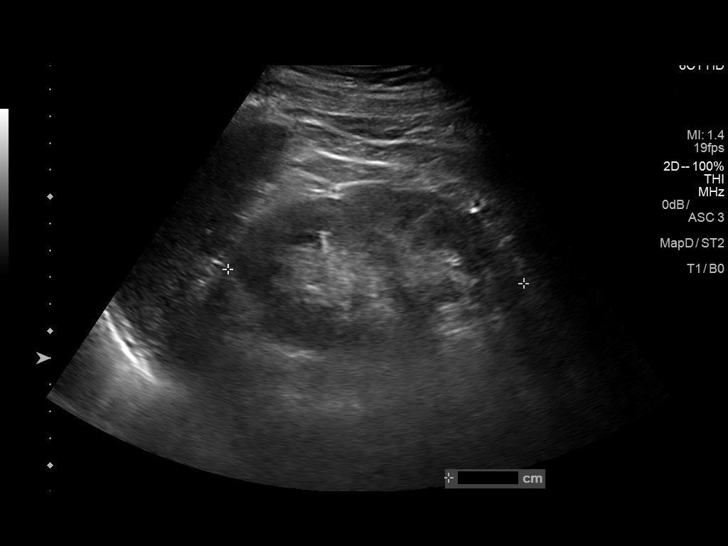
[im 22/48]
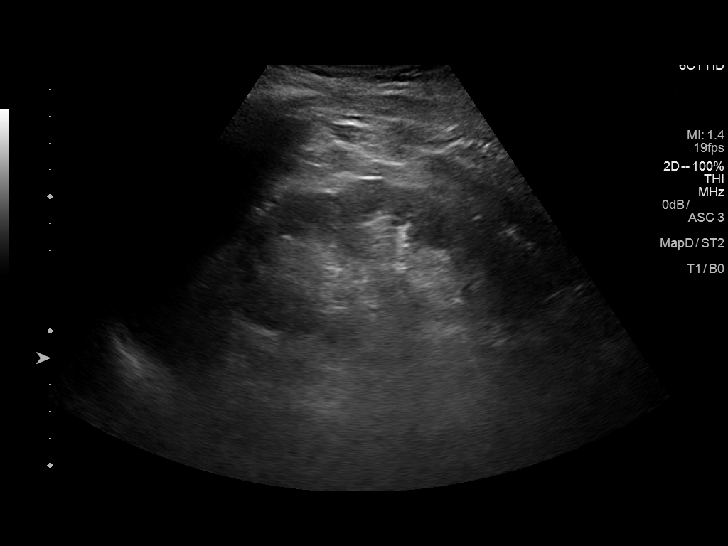
[im 26/48]
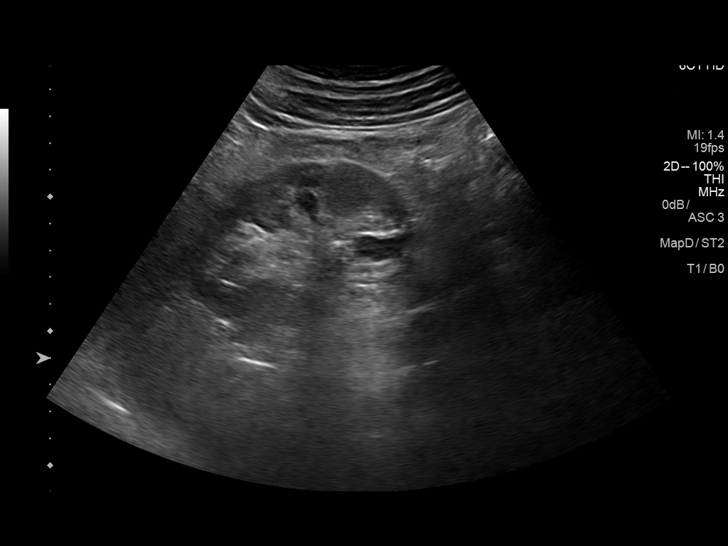
[im 30/48]
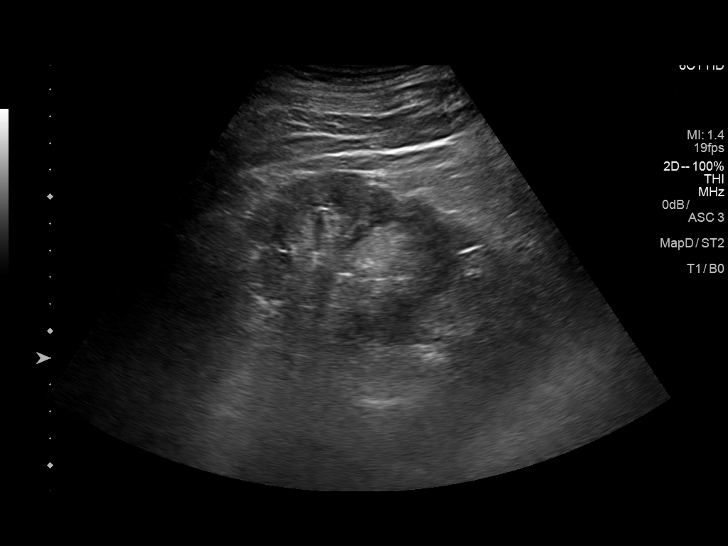
[im 32/48]
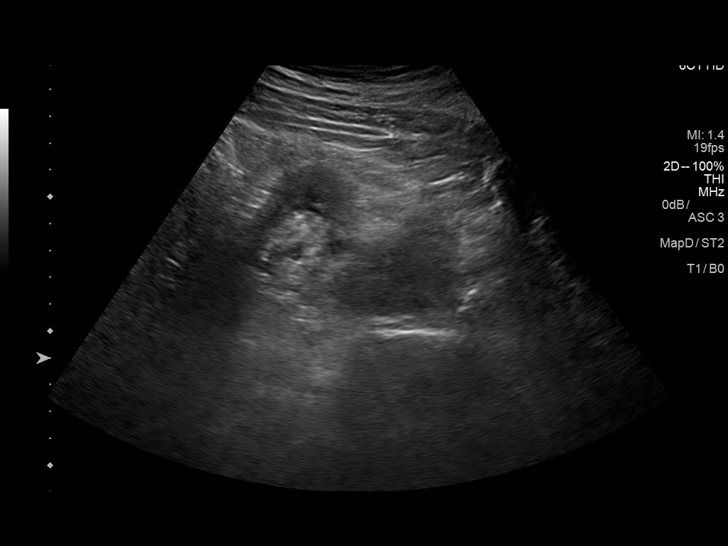
[im 36/48]
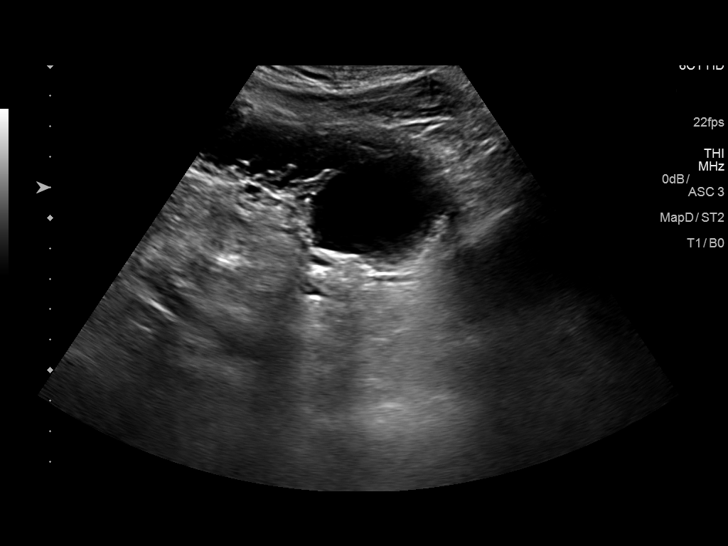
[im 40/48]
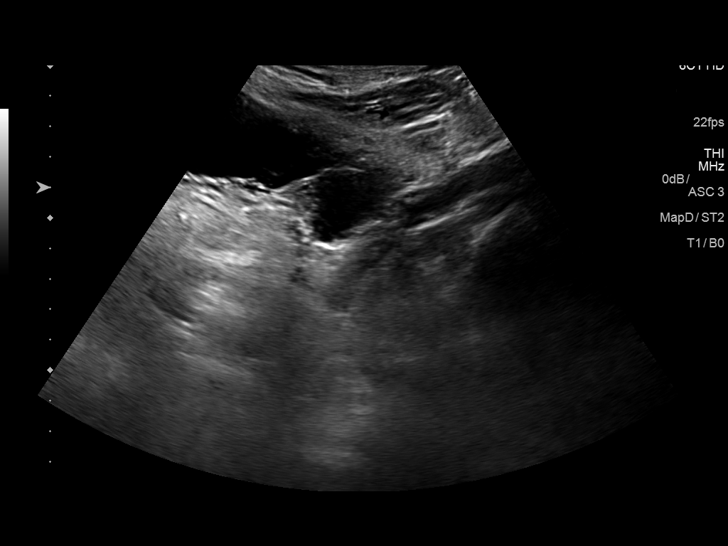
[im 44/48]
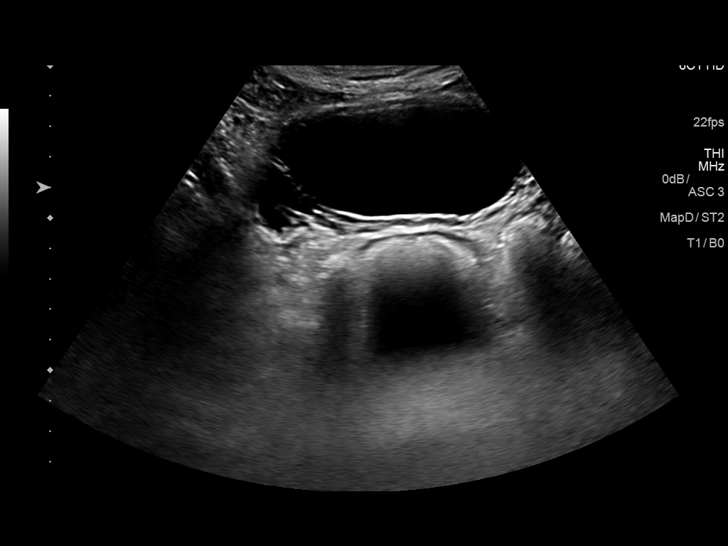
[im 48/48]
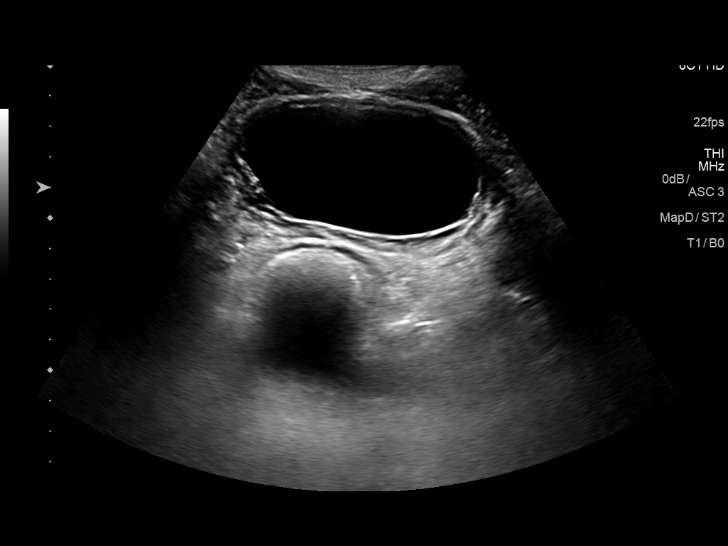

[14 of 25 positions shown; findings below may reference images not displayed]

FINDINGS: Right Kidney:

Length: 10.6 cm. Echogenicity within normal limits. No mass or
hydronephrosis visualized.

Left Kidney:

Length: 11.6 cm. A few prominent calices are noted in the mid and
lower pole of the left kidney likely representing calyceal
diverticula. No obstructive changes are noted

Bladder:

Bladder is well distended. There are changes suggestive of a partial
septum within lumen of the bladder.
IMPRESSION: Prominent calices in the left kidney as described likely
representing caliceal diverticula or stenosed infundibulum

Findings suggestive of a partial septation within the bladder

## 2019-02-15 NOTE — Progress Notes (Addendum)
Cardiology Office Note    Date:  02/16/2019   ID:  Robert Chaney, DOB 02-Sep-1950, MRN [id number]  PCP:  [name], MD  Cardiologist: Ena Dawley, MD EPS: None  No chief complaint on file.   History of Present Illness:  Robert Chaney is a 68 y.o. male with history of hypertrophic cardiomyopathy septal thickness 21 mm without significant gradient and no SAM.  MRI 10/2016 with the presence of LGE and 8 myocardial segments upper septal thickness 21 mm.  Patient completely asymptomatic and no history of syncope.  Also has essential hypertension, hyperlipidemia, chronic diastolic CHF, CKD stage IV, OSA, DM type 2  Patient last saw Dr. Meda Coffee 02/2018 and was doing well without symptoms.  Patient comes in today for f/u.  Denies chest pain, palpitations, dyspnea, dyspnea on exertion, dizziness, presyncope or syncope.  He stated he never sleeps at night and that is major complaint.  He never got fitted for CPAP when he was diagnosed with OSA last year.  He had a family emergency.    Past Medical History:  Diagnosis Date  . Altered mental status    a. 05/2017 - adm with blurred vision, somnolence in setting of AKI and high blood sugar.  . Anemia   . CKD (chronic kidney disease), stage III ([place])   . Diabetes mellitus   . Diastolic CHF, chronic (HCC)    A.  03/2009 Echo: EF 60-65%, Gr II diast dysfxn  . Elevated troponin    a. 2013 - troponin 1.4. b. 2019 - troponin 0.32; neg nuc 07/2017.  . Glaucoma   . Hyperlipidemia    HYPERCHOLESTEROLEMIA  . Hypertension    MARKED LEFT VENTRICULAR HYPERTROPHY BY PREVIOUS ECHOCARDIOGRAM--HE HAS HYPERDYNAMIC LEFT VENTRICULAR SYSTOLIC FUNCTION AND HAS IMPAIRED RELAXATION BY ECHO  . Hypertrophic cardiomyopathy ([place])   . NSVT (nonsustained ventricular tachycardia) ([place])   . Premature atrial contractions   . PVC's (premature ventricular contractions)   . SVT (supraventricular tachycardia) (HCC)     Past Surgical History:  Procedure Laterality  Date  . NO PAST SURGERIES      Current Medications: Current Meds  Medication Sig  . amLODipine (NORVASC) 10 MG tablet Take 1 tablet (10 mg total) by mouth at bedtime.  Marland Kitchen atorvastatin (LIPITOR) 20 MG tablet Take 1 tablet (20 mg total) by mouth daily.  . carvedilol (COREG) 25 MG tablet Take 1 tablet (25 mg total) by mouth 2 (two) times daily.  . cholecalciferol (VITAMIN D3) 25 MCG (1000 UT) tablet Take 1,000 Units by mouth daily.  . furosemide (LASIX) 20 MG tablet Take 1 tablet (20 mg total) by mouth daily.  . hydrALAZINE (APRESOLINE) 50 MG tablet Take 1 tablet (50 mg total) by mouth 3 (three) times daily.  . insulin aspart (NOVOLOG FLEXPEN) 100 UNIT/ML FlexPen Inject 5 Units into the skin 3 (three) times daily with meals.  . Insulin Glargine (TOUJEO MAX SOLOSTAR) 300 UNIT/ML SOPN Inject 25 Units into the skin at bedtime.  . Insulin Pen Needle (B-D UF III MINI PEN NEEDLES) 31G X 5 MM MISC Four times daily  . isosorbide mononitrate (IMDUR) 60 MG 24 hr tablet Take 1 tablet (60 mg total) by mouth daily.  Marland Kitchen losartan (COZAAR) 50 MG tablet Take 50 mg by mouth daily.  . nitroGLYCERIN (NITROSTAT) 0.4 MG SL tablet Place 1 tablet (0.4 mg total) under the tongue every 5 (five) minutes as needed for chest pain.     Allergies:   Aspirin   Social History  Socioeconomic History  . Marital status: Legally Separated    Spouse name: Not on file  . Number of children: Not on file  . Years of education: Not on file  . Highest education level: Not on file  Occupational History  . Occupation: Forensic psychologist: [employer]    Comment: At [employer]  . Financial resource strain: Not on file  . Food insecurity    Worry: Not on file    Inability: Not on file  . Transportation needs    Medical: Not on file    Non-medical: Not on file  Tobacco Use  . Smoking status: Never Smoker  . Smokeless tobacco: Never Used  Substance and Sexual Activity  . Alcohol use: No  . Drug use:  No  . Sexual activity: Never  Lifestyle  . Physical activity    Days per week: Not on file    Minutes per session: Not on file  . Stress: Not on file  Relationships  . Social Herbalist on phone: Not on file    Gets together: Not on file    Attends religious service: Not on file    Active member of club or organization: Not on file    Attends meetings of clubs or organizations: Not on file    Relationship status: Not on file  Other Topics Concern  . Not on file  Social History Narrative   Originally from [place].      Family History:  The patient's   family history includes Diabetes in his brother and brother; Hypertension in his father.   ROS:   Please see the history of present illness.    ROS All other systems reviewed and are negative.   PHYSICAL EXAM:   VS:  BP (!) 150/70   Pulse 69   Ht 5\' 8"  (1.727 m)   Wt 188 lb 12.8 oz (85.6 kg)   SpO2 96%   BMI 28.71 kg/m   Physical Exam  GEN: Well nourished, well developed, in no acute distress  Neck: no JVD, carotid bruits, or masses Cardiac:RRR; 2/6 to 3/6 systolic murmur at the left sternal border Respiratory:  clear to auscultation bilaterally, normal work of breathing GI: soft, nontender, nondistended, + BS Ext: without cyanosis, clubbing, or edema Neuro:  Alert and Oriented x 3 Psych: euthymic mood, full affect  Wt Readings from Last 3 Encounters:  02/16/19 188 lb 12.8 oz (85.6 kg)  12/04/18 195 lb (88.5 kg)  12/04/18 195 lb 6.4 oz (88.6 kg)      Studies/Labs Reviewed:   EKG:  EKG is  ordered today.  The ekg ordered today demonstrates NSR with LVH freq PVC's nonspecific ST changes  Recent Labs: 06/30/2018: BUN 35; Creatinine, Ser 1.81; Potassium 4.0; Sodium 135   Lipid Panel    Component Value Date/Time   CHOL 157 11/07/2016 0922   TRIG 81 11/07/2016 0922   HDL 59 11/07/2016 0922   CHOLHDL 2.7 11/07/2016 0922   CHOLHDL 3 05/04/2014 0852   VLDL 13.6 05/04/2014 0852   LDLCALC 82  11/07/2016 0922    Additional studies/ records that were reviewed today include: Cardiac MRI 2018   FINDINGS: 1. Normal left ventricular size with severe left ventricular hypertrophy with basal septal thickness of 21 mm and normal systolic function (LVEF = 62%). There are no regional wall motion abnormalities.   There are diffuse patchy late gadolinium enhancement in the basal anterior, anteroseptal, inferior, inferolateral,  mid anterior, inferior, inferolateral and apical inferior walls.   LVEDD; 50 mm   LVESD:  34 mm   LVEDV:  139 ml   LVESV:  52 ml   SV:  87 ml   CO:  5.8 L/min   Myocardial mass:  256 g   2.  Normal right ventricular size, thickness and systolic volume.   3.  Mild mitral and tricuspid regurgitation.   4. Normal size of the aortic root, thoracic aorta and pulmonary artery.   5.  Normal pericardium no pericardial effusion.   IMPRESSION: 1. Normal left ventricular size with severe left ventricular hypertrophy with basal septal thickness of 21 mm and normal systolic function (LVEF = 62%). There are no regional wall motion abnormalities.   There are diffuse patchy late gadolinium enhancement in the basal anterior, anteroseptal, inferior, inferolateral, mid anterior, inferior, inferolateral and apical inferior walls.   2.  Normal right ventricular size, thickness and systolic volume.   3.  Mild mitral and tricuspid regurgitation.   Collectively, these findings are consistent with hypertrophic cardiomyopathy with high risk feature - presence of LGE in the 8 myocardial segments.   Ena Dawley     Electronically Signed   By: Ena Dawley   On: 11/18/2016 14:47  Echo 2017 Study Conclusions   - Left ventricle: Somewhat speckled asymetric septal hypertrophy.   The cavity size was normal. Wall thickness was increased in a   pattern of severe LVH. Systolic function was normal. The   estimated ejection fraction was in the range of 60%  to 65%. Wall   motion was normal; there were no regional wall motion   abnormalities. Doppler parameters are consistent with both   elevated ventricular end-diastolic filling pressure and elevated   left atrial filling pressure. - Left atrium: The atrium was moderately dilated. - Atrial septum: No defect or patent foramen ovale was identified.    ASSESSMENT:    1. Hypertrophic cardiomyopathy ([place])   2. Essential hypertension   3. Diastolic CHF, chronic ([place])   4. Dyslipidemia   5. DM (diabetes mellitus) with complications ([place])   6. PVC (premature ventricular contraction)   7. Chronic kidney disease (CKD), stage III (moderate) (HCC)   8. OSA (obstructive sleep apnea)      PLAN:  In order of problems listed above:  Hypertrophic cardiomyopathy septal thickness 21 mm on MRI 10/2016 has not had an echo since 2017.  Denies any syncope palpitations or chest pain.  He is having a lot of PVCs on EKG today and exam.  Will check labs and repeat echo.  Follow-up with Dr. Meda Coffee  Essential hypertension blood pressure on the high side.  Patient has not slept and is not being treated for CPAP.  Will not make any changes today.  Chronic diastolic CHF compensated  Dyslipidemia LDL 82 2018 on Coreg  DM type II followed by endocrine  CKD stage III last creatinine 1.74-11/2018   Frequent PVCs check potassium and magnesium today follow-up with Dr. Meda Coffee  OSA diagnosed last year but never was fitted for CPAP. Now will need a new sleep study. Patient is not sleeping at all with shortness of breath.  Will contact [facility] and try to get him fitted for CPAP soon as possible.  Medication Adjustments/Labs and Tests Ordered: Current medicines are reviewed at length with the patient today.  Concerns regarding medicines are outlined above.  Medication changes, Labs and Tests ordered today are listed in the Patient Instructions below. Patient Instructions  Medication Instructions:  Your physician  recommends that you continue on your current medications as directed. Please refer to the Current Medication list given to you today.  If you need a refill on your cardiac medications before your next appointment, please call your pharmacy.   Lab work:  TODAY:  BMET & MAGNESIUM  If you have labs (blood work) drawn today and your tests are completely normal, you will receive your results only by: Marland Kitchen MyChart Message (if you have MyChart) OR . A paper copy in the mail If you have any lab test that is abnormal or we need to change your treatment, we will call you to review the results.  Testing/Procedures: Your physician has requested that you have an echocardiogram 02/19/2019 arrive at 8:05 a.m.  Echocardiography is a painless test that uses sound waves to create images of your heart. It provides your doctor with information about the size and shape of your heart and how well your heart's chambers and valves are working. This procedure takes approximately one hour. There are no restrictions for this procedure.    Follow-Up: At [facility], you and your health needs are our priority.  As part of our continuing mission to provide you with exceptional heart care, we have created designated Provider Care Teams.  These Care Teams include your primary Cardiologist (physician) and Advanced Practice Providers (APPs -  Physician Assistants and Nurse Practitioners) who all work together to provide you with the care you need, when you need it. You will need a follow up appointment in 3-4 months.  Please call our office 2 months in advance to schedule this appointment.  You may see Ena Dawley, MD or one of the following Advanced Practice Providers on your designated Care Team:   [place], PA-C Melina Copa, PA-C . Ermalinda Barrios, PA-C  Any Other Special Instructions Will Be Listed Below (If Applicable).  Echocardiogram An echocardiogram is a procedure that uses painless sound waves  (ultrasound) to produce an image of the heart. Images from an echocardiogram can provide important information about:  Signs of coronary artery disease (CAD).  Aneurysm detection. An aneurysm is a weak or damaged part of an artery wall that bulges out from the normal force of blood pumping through the body.  Heart size and shape. Changes in the size or shape of the heart can be associated with certain conditions, including heart failure, aneurysm, and CAD.  Heart muscle function.  Heart valve function.  Signs of a past heart attack.  Fluid buildup around the heart.  Thickening of the heart muscle.  A tumor or infectious growth around the heart valves. Tell a health care provider about:  Any allergies you have.  All medicines you are taking, including vitamins, herbs, eye drops, creams, and over-the-counter medicines.  Any blood disorders you have.  Any surgeries you have had.  Any medical conditions you have.  Whether you are pregnant or may be pregnant. What are the risks? Generally, this is a safe procedure. However, problems may occur, including:  Allergic reaction to dye (contrast) that may be used during the procedure. What happens before the procedure? No specific preparation is needed. You may eat and drink normally. What happens during the procedure?   An IV tube may be inserted into one of your veins.  You may receive contrast through this tube. A contrast is an injection that improves the quality of the pictures from your heart.  A gel will be applied to your chest.  A wand-like tool (transducer) will be moved over your chest. The gel will help to transmit the sound waves from the transducer.  The sound waves will harmlessly bounce off of your heart to allow the heart images to be captured in real-time motion. The images will be recorded on a computer. The procedure may vary among health care providers and hospitals. What happens after the procedure?   You may return to your normal, everyday life, including diet, activities, and medicines, unless your health care provider tells you not to do that. Summary  An echocardiogram is a procedure that uses painless sound waves (ultrasound) to produce an image of the heart.  Images from an echocardiogram can provide important information about the size and shape of your heart, heart muscle function, heart valve function, and fluid buildup around your heart.  You do not need to do anything to prepare before this procedure. You may eat and drink normally.  After the echocardiogram is completed, you may return to your normal, everyday life, unless your health care provider tells you not to do that. This information is not intended to replace advice given to you by your health care provider. Make sure you discuss any questions you have with your health care provider. Document Released: 06/07/2000 Document Revised: 10/01/2018 Document Reviewed: 07/13/2016 Elsevier Patient Education  2020 [place], Ermalinda Barrios, [place]  02/16/2019 11:06 AM    [facility] [address], [place], [place]  [address] Phone: [phone]; Fax: [phone]

## 2019-02-16 ENCOUNTER — Telehealth: Payer: Self-pay | Admitting: Pharmacist

## 2019-02-16 ENCOUNTER — Other Ambulatory Visit: Payer: Self-pay

## 2019-02-16 ENCOUNTER — Encounter: Payer: Self-pay | Admitting: Physician Assistant

## 2019-02-16 ENCOUNTER — Ambulatory Visit: Payer: PPO | Admitting: Physician Assistant

## 2019-02-16 ENCOUNTER — Telehealth: Payer: Self-pay | Admitting: *Deleted

## 2019-02-16 VITALS — BP 150/70 | HR 69 | Ht 68.0 in | Wt 188.8 lb

## 2019-02-16 DIAGNOSIS — I5032 Chronic diastolic (congestive) heart failure: Secondary | ICD-10-CM | POA: Diagnosis not present

## 2019-02-16 DIAGNOSIS — I422 Other hypertrophic cardiomyopathy: Secondary | ICD-10-CM

## 2019-02-16 DIAGNOSIS — I493 Ventricular premature depolarization: Secondary | ICD-10-CM | POA: Diagnosis not present

## 2019-02-16 DIAGNOSIS — E785 Hyperlipidemia, unspecified: Secondary | ICD-10-CM | POA: Diagnosis not present

## 2019-02-16 DIAGNOSIS — G4733 Obstructive sleep apnea (adult) (pediatric): Secondary | ICD-10-CM | POA: Diagnosis not present

## 2019-02-16 DIAGNOSIS — E118 Type 2 diabetes mellitus with unspecified complications: Secondary | ICD-10-CM | POA: Diagnosis not present

## 2019-02-16 DIAGNOSIS — I10 Essential (primary) hypertension: Secondary | ICD-10-CM | POA: Diagnosis not present

## 2019-02-16 DIAGNOSIS — N183 Chronic kidney disease, stage 3 unspecified: Secondary | ICD-10-CM

## 2019-02-16 LAB — BASIC METABOLIC PANEL
BUN/Creatinine Ratio: 15 (ref 10–24)
BUN: 31 mg/dL — ABNORMAL HIGH (ref 8–27)
CO2: 24 mmol/L (ref 20–29)
Calcium: 9.2 mg/dL (ref 8.6–10.2)
Chloride: 104 mmol/L (ref 96–106)
Creatinine, Ser: 2.04 mg/dL — ABNORMAL HIGH (ref 0.76–1.27)
GFR calc Af Amer: 37 mL/min/{1.73_m2} — ABNORMAL LOW (ref >59)
GFR calc non Af Amer: 32 mL/min/{1.73_m2} — ABNORMAL LOW (ref >59)
Glucose: 94 mg/dL (ref 65–99)
Potassium: 4.1 mmol/L (ref 3.5–5.2)
Sodium: 141 mmol/L (ref 134–144)

## 2019-02-16 LAB — MAGNESIUM: Magnesium: 2 mg/dL (ref 1.6–2.3)

## 2019-02-16 NOTE — Telephone Encounter (Signed)
-----   Message from Jeanann Lewandowsky, [place] sent at 02/16/2019  2:28 PM EDT ----- Regarding: RE: Sleep Ok! I have placed the order. ----- Message ----- From: Freada Bergeron, CMA Sent: 02/16/2019   1:39 PM EDT To: Jeanann Lewandowsky, RMA Subject: RE: Sleep                                      Hello, yes he has to start over with a sleep study and his visit has to mention he is being referred for it. Needs to mention symptoms too. Thanks  ----- Message ----- From: Jeanann Lewandowsky, RMA Sent: 02/16/2019  10:57 AM EDT To: Freada Bergeron, CMA Subject: Sleep                                          Pt had sleep study last year but never made it to get fitted for his stuff. Does he need another study before or can you just call pt and reschedule him for whatever it is that he needs. We are seeing him today and are letting him go as I type this, so you can call a little later.  Thanks!

## 2019-02-16 NOTE — Telephone Encounter (Signed)
-----   Message from [facility] sent at 02/16/2019 11:30 AM EDT ----- Regarding: Trixie Deis referral Hi Meranda Dechaine  Please call Mr. Lacock Referral is in Epic and I will send you one through email. Please call within 5 days Is on APL Thank you

## 2019-02-16 NOTE — Patient Outreach (Signed)
[facility]) Care Management  02/16/2019  MOROCCO GIPE 1950-07-02 [id number]   Patient was called regarding medication assistance with his glaucoma medications. Unfortunately, he did not answer the phone. HIPAA compliant message was left on his voicemail.  Patient was referred by [facility].  Plan: Call patient back in 3-5 business days. Send unsuccessful outreach letter.   Elayne Guerin, PharmD, [place] Clinical Pharmacist [phone]

## 2019-02-16 NOTE — Patient Instructions (Addendum)
Medication Instructions:  Your physician recommends that you continue on your current medications as directed. Please refer to the Current Medication list given to you today.  If you need a refill on your cardiac medications before your next appointment, please call your pharmacy.   Lab work:  TODAY:  BMET & MAGNESIUM  If you have labs (blood work) drawn today and your tests are completely normal, you will receive your results only by: Marland Kitchen MyChart Message (if you have MyChart) OR . A paper copy in the mail If you have any lab test that is abnormal or we need to change your treatment, we will call you to review the results.  Testing/Procedures: Your physician has requested that you have an echocardiogram 02/19/2019 arrive at 8:05 a.m.  Echocardiography is a painless test that uses sound waves to create images of your heart. It provides your doctor with information about the size and shape of your heart and how well your heart's chambers and valves are working. This procedure takes approximately one hour. There are no restrictions for this procedure.    Follow-Up: At [facility], you and your health needs are our priority.  As part of our continuing mission to provide you with exceptional heart care, we have created designated Provider Care Teams.  These Care Teams include your primary Cardiologist (physician) and Advanced Practice Providers (APPs -  Physician Assistants and Nurse Practitioners) who all work together to provide you with the care you need, when you need it. You will need a follow up appointment in 3-4 months.  Please call our office 2 months in advance to schedule this appointment.  You may see Ena Dawley, MD or one of the following Advanced Practice Providers on your designated Care Team:   [place], PA-C Melina Copa, PA-C . Ermalinda Barrios, PA-C  Any Other Special Instructions Will Be Listed Below (If Applicable).  Echocardiogram An echocardiogram is a procedure  that uses painless sound waves (ultrasound) to produce an image of the heart. Images from an echocardiogram can provide important information about:  Signs of coronary artery disease (CAD).  Aneurysm detection. An aneurysm is a weak or damaged part of an artery wall that bulges out from the normal force of blood pumping through the body.  Heart size and shape. Changes in the size or shape of the heart can be associated with certain conditions, including heart failure, aneurysm, and CAD.  Heart muscle function.  Heart valve function.  Signs of a past heart attack.  Fluid buildup around the heart.  Thickening of the heart muscle.  A tumor or infectious growth around the heart valves. Tell a health care provider about:  Any allergies you have.  All medicines you are taking, including vitamins, herbs, eye drops, creams, and over-the-counter medicines.  Any blood disorders you have.  Any surgeries you have had.  Any medical conditions you have.  Whether you are pregnant or may be pregnant. What are the risks? Generally, this is a safe procedure. However, problems may occur, including:  Allergic reaction to dye (contrast) that may be used during the procedure. What happens before the procedure? No specific preparation is needed. You may eat and drink normally. What happens during the procedure?   An IV tube may be inserted into one of your veins.  You may receive contrast through this tube. A contrast is an injection that improves the quality of the pictures from your heart.  A gel will be applied to your chest.  A  wand-like tool (transducer) will be moved over your chest. The gel will help to transmit the sound waves from the transducer.  The sound waves will harmlessly bounce off of your heart to allow the heart images to be captured in real-time motion. The images will be recorded on a computer. The procedure may vary among health care providers and hospitals. What  happens after the procedure?  You may return to your normal, everyday life, including diet, activities, and medicines, unless your health care provider tells you not to do that. Summary  An echocardiogram is a procedure that uses painless sound waves (ultrasound) to produce an image of the heart.  Images from an echocardiogram can provide important information about the size and shape of your heart, heart muscle function, heart valve function, and fluid buildup around your heart.  You do not need to do anything to prepare before this procedure. You may eat and drink normally.  After the echocardiogram is completed, you may return to your normal, everyday life, unless your health care provider tells you not to do that. This information is not intended to replace advice given to you by your health care provider. Make sure you discuss any questions you have with your health care provider. Document Released: 06/07/2000 Document Revised: 10/01/2018 Document Reviewed: 07/13/2016 Elsevier Patient Education  2020 [employer].

## 2019-02-16 NOTE — Addendum Note (Signed)
Addended by: Gaetano Net on: 02/16/2019 02:28 PM   Modules accepted: Orders

## 2019-02-18 ENCOUNTER — Telehealth: Payer: Self-pay | Admitting: Physician Assistant

## 2019-02-18 NOTE — Telephone Encounter (Signed)
Patient returned call for lab results. Patient states it is okay to leave detailed voice mail.

## 2019-02-18 NOTE — Telephone Encounter (Signed)
The patient has been notified of the result and verbalized understanding.  All questions (if any) were answered. Frederik Schmidt, RN 02/18/2019 3:12 PM

## 2019-02-19 ENCOUNTER — Ambulatory Visit (HOSPITAL_COMMUNITY): Payer: PPO | Attending: Cardiology

## 2019-02-19 ENCOUNTER — Other Ambulatory Visit: Payer: Self-pay

## 2019-02-19 DIAGNOSIS — I422 Other hypertrophic cardiomyopathy: Secondary | ICD-10-CM | POA: Diagnosis not present

## 2019-02-22 ENCOUNTER — Other Ambulatory Visit: Payer: Self-pay | Admitting: Pharmacist

## 2019-02-22 ENCOUNTER — Ambulatory Visit: Payer: Self-pay | Admitting: Pharmacist

## 2019-02-22 NOTE — Patient Outreach (Signed)
[facility]) Care Management  02/22/2019  Robert Chaney [birth year] [id number]   Patient was called regarding medication assistance. Unfortunately, he did not answer his phone. HIPAA compliant message was left on his voicemail.  Today's call was the second unsuccessful phone call.   Plan: Call patient back in 10-14 business days. Close case if I cannot reach the patient.  Elayne Guerin, PharmD, [place] Clinical Pharmacist [phone]

## 2019-03-03 ENCOUNTER — Telehealth: Payer: Self-pay | Admitting: Cardiology

## 2019-03-03 DIAGNOSIS — I493 Ventricular premature depolarization: Secondary | ICD-10-CM

## 2019-03-03 NOTE — Telephone Encounter (Signed)
  Patient is returning call regarding test results. Patient states he work 7a-7p and does not have a cell number. Please leave a detailed message on voicemail.

## 2019-03-04 NOTE — Telephone Encounter (Signed)
Patient has been made aware of lab and echo results. Instructed patient to stay hydrated. Made patient aware that we will order a 48 holter to assess PVC burden. Patient aware that he will be contacted by monitor technician to arrange. Patient verbalized understanding and thanked me for the call.

## 2019-03-04 NOTE — Telephone Encounter (Signed)
Left message for patient to call back for lab and echo results. Let patient know that I need to speak to him on the phone instead of leaving a message with results as there are results that need in depth explanation and recommendations to follow through with.

## 2019-03-04 NOTE — Telephone Encounter (Signed)
-----   Message from Imogene Burn, PA-C sent at 02/17/2019  7:55 AM EDT ----- Kidney function up a bit. Make sure he's staying hydrated. Potassium and MG normal

## 2019-03-04 NOTE — Telephone Encounter (Signed)
-----   Message from Imogene Burn, PA-C sent at 02/22/2019  3:25 PM EDT ----- Can you let patient know Dr. Meda Coffee reviewed his echo which shows normal heart function but the HOCM. He was having a lot of PVC's in office. She recommends a 48 hr monitor for PVC load. If high we'd refer to Dr. Caryl Comes, otherwise no changes. thanks

## 2019-03-05 DIAGNOSIS — Z23 Encounter for immunization: Secondary | ICD-10-CM | POA: Diagnosis not present

## 2019-03-05 DIAGNOSIS — I509 Heart failure, unspecified: Secondary | ICD-10-CM | POA: Diagnosis not present

## 2019-03-05 DIAGNOSIS — E1122 Type 2 diabetes mellitus with diabetic chronic kidney disease: Secondary | ICD-10-CM | POA: Diagnosis not present

## 2019-03-05 DIAGNOSIS — Z6828 Body mass index (BMI) 28.0-28.9, adult: Secondary | ICD-10-CM | POA: Diagnosis not present

## 2019-03-05 DIAGNOSIS — I10 Essential (primary) hypertension: Secondary | ICD-10-CM | POA: Diagnosis not present

## 2019-03-09 ENCOUNTER — Telehealth: Payer: Self-pay | Admitting: *Deleted

## 2019-03-09 NOTE — Telephone Encounter (Signed)
Sleep study  PA faxed to [facility].

## 2019-03-10 ENCOUNTER — Ambulatory Visit: Payer: Self-pay | Admitting: Pharmacist

## 2019-03-10 ENCOUNTER — Other Ambulatory Visit: Payer: Self-pay

## 2019-03-10 ENCOUNTER — Other Ambulatory Visit: Payer: Self-pay | Admitting: Pharmacist

## 2019-03-10 NOTE — Patient Outreach (Signed)
[facility]) Care Management  03/10/2019  Robert Chaney 04-21-1951 [id number]   Patient was called regarding medication assistance. Unfortunately, he did not answer his phone. Today's call was the 3rd unsuccessful phone call. Patient was sent an unsuccessful outreach letter on 02/16/2019.  Plan: Close patient's pharmacy case due to inability to contact.  Send closure letters to patient and PCP.   Elayne Guerin, PharmD, [place] Clinical Pharmacist [phone]

## 2019-03-12 ENCOUNTER — Encounter: Payer: Self-pay | Admitting: Internal Medicine

## 2019-03-12 ENCOUNTER — Encounter: Payer: PPO | Attending: Internal Medicine | Admitting: Dietician

## 2019-03-12 ENCOUNTER — Other Ambulatory Visit: Payer: Self-pay

## 2019-03-12 ENCOUNTER — Encounter: Payer: Self-pay | Admitting: Dietician

## 2019-03-12 ENCOUNTER — Ambulatory Visit (INDEPENDENT_AMBULATORY_CARE_PROVIDER_SITE_OTHER): Payer: PPO | Admitting: Internal Medicine

## 2019-03-12 VITALS — BP 144/80 | HR 69 | Temp 98.3°F | Ht 68.0 in | Wt 187.6 lb

## 2019-03-12 DIAGNOSIS — E1142 Type 2 diabetes mellitus with diabetic polyneuropathy: Secondary | ICD-10-CM

## 2019-03-12 DIAGNOSIS — N183 Chronic kidney disease, stage 3 unspecified: Secondary | ICD-10-CM

## 2019-03-12 DIAGNOSIS — E118 Type 2 diabetes mellitus with unspecified complications: Secondary | ICD-10-CM

## 2019-03-12 DIAGNOSIS — E11319 Type 2 diabetes mellitus with unspecified diabetic retinopathy without macular edema: Secondary | ICD-10-CM

## 2019-03-12 DIAGNOSIS — Z794 Long term (current) use of insulin: Secondary | ICD-10-CM | POA: Diagnosis not present

## 2019-03-12 DIAGNOSIS — E1122 Type 2 diabetes mellitus with diabetic chronic kidney disease: Secondary | ICD-10-CM | POA: Diagnosis not present

## 2019-03-12 LAB — POCT GLYCOSYLATED HEMOGLOBIN (HGB A1C): Hemoglobin A1C: 5.7 % — AB (ref 4.0–5.6)

## 2019-03-12 MED ORDER — ONETOUCH VERIO VI STRP: ORAL_STRIP | 12 refills | Status: DC

## 2019-03-12 MED ORDER — NOVOLOG FLEXPEN 100 UNIT/ML ~~LOC~~ SOPN: 5.0000 [IU] | PEN_INJECTOR | Freq: Three times a day (TID) | SUBCUTANEOUS | 11 refills | Status: DC

## 2019-03-12 NOTE — Progress Notes (Signed)
Name: Robert Chaney  Age/ Sex: 68 y.o., male   MRN/ DOB: [id number], 03/19/1951     PCP: [name], MD   Reason for Endocrinology Evaluation: Type 2 Diabetes Mellitus  Initial Endocrine Consultative Visit: 06/30/2018    PATIENT IDENTIFIER: Robert Chaney is a 68 y.o. male with a past medical history of T2DM, HTN, hypertrophic cardiomyopathy, and CKD III. The patient has followed with Endocrinology clinic since 06/30/2018 for consultative assistance with management of his diabetes.  DIABETIC HISTORY:  [name] was diagnosed with T2DM Since 2014, started insulin therapy at diagnosis. His hemoglobin A1c has ranged from 6.9%  in 2015, peaking at 13.0%  In 2019.  Linagliptin discontinued in 06/2018 due to cost    Saw Antonieta Iba 08/2018 SUBJECTIVE:   During the last visit (12/04/2018): We continued Toujeo and decreased Novolog dose.   Today (03/12/2019): [name] is here for 30-month follow-up on his diabetes management. He checks his blood sugars 2  times daily, preprandial. The patient has not had hypoglycemic episodes since the last clinic visit. Otherwise, the patient has not required any recent emergency interventions for hypoglycemia and has not had recent hospitalizations secondary to hyper or hypoglycemic episodes.    ROS: As per HPI and as detailed below: Review of Systems  Constitutional: Negative for fever.  HENT: Negative for congestion and sore throat.   Respiratory: Negative for cough and shortness of breath.   Cardiovascular: Negative for chest pain and palpitations.  Gastrointestinal: Negative for diarrhea and nausea.      HOME DIABETES REGIMEN:   Toujeo 25 units daily   Novolog 5 units TIDQAC     METER DOWNLOAD SUMMARY:9/5-9/18/20 Fingerstick Blood Glucose Tests = 19 Average checks per day = 1.4 Overall Mean FS Glucose = 179  BG Ranges: Low = 119 High = 267   Hypoglycemic Events/30 Days: BG < 50 = 0 Episodes of symptomatic severe hypoglycemia = 0     DIABETIC COMPLICATIONS: Microvascular complications:   Neuropathy, CKD III, ?retinopathy on the left   Last eye exam: Completed 05/2018  Macrovascular complications:    Denies: CAD, PVD, CVA  HISTORY:  Past Medical History:  Past Medical History:  Diagnosis Date  . Altered mental status    a. 05/2017 - adm with blurred vision, somnolence in setting of AKI and high blood sugar.  . Anemia   . CKD (chronic kidney disease), stage III ([place])   . Diabetes mellitus   . Diastolic CHF, chronic (HCC)    A.  03/2009 Echo: EF 60-65%, Gr II diast dysfxn  . Elevated troponin    a. 2013 - troponin 1.4. b. 2019 - troponin 0.32; neg nuc 07/2017.  . Glaucoma   . Hyperlipidemia    HYPERCHOLESTEROLEMIA  . Hypertension    MARKED LEFT VENTRICULAR HYPERTROPHY BY PREVIOUS ECHOCARDIOGRAM--HE HAS HYPERDYNAMIC LEFT VENTRICULAR SYSTOLIC FUNCTION AND HAS IMPAIRED RELAXATION BY ECHO  . Hypertrophic cardiomyopathy ([place])   . NSVT (nonsustained ventricular tachycardia) ([place])   . Premature atrial contractions   . PVC's (premature ventricular contractions)   . SVT (supraventricular tachycardia) (HCC)    Past Surgical History:  Past Surgical History:  Procedure Laterality Date  . NO PAST SURGERIES      Social History:  reports that he has never smoked. He has never used smokeless tobacco. He reports that he does not drink alcohol or use drugs. Family History:  Family History  Problem Relation Age of Onset  . Hypertension Father   . Diabetes  Brother   . Diabetes Brother      HOME MEDICATIONS: Allergies as of 03/12/2019      Reactions   Aspirin Itching   itching      Medication List       Accurate as of March 12, 2019  8:50 AM. If you have any questions, ask your nurse or doctor.        amLODipine 10 MG tablet Commonly known as: NORVASC Take 1 tablet (10 mg total) by mouth at bedtime.   atorvastatin 20 MG tablet Commonly known as: LIPITOR Take 1 tablet (20 mg total) by  mouth daily.   carvedilol 25 MG tablet Commonly known as: COREG Take 1 tablet (25 mg total) by mouth 2 (two) times daily.   cholecalciferol 25 MCG (1000 UT) tablet Commonly known as: VITAMIN D3 Take 1,000 Units by mouth daily.   furosemide 20 MG tablet Commonly known as: LASIX Take 1 tablet (20 mg total) by mouth daily.   hydrALAZINE 50 MG tablet Commonly known as: APRESOLINE Take 1 tablet (50 mg total) by mouth 3 (three) times daily.   Insulin Pen Needle 31G X 5 MM Misc Commonly known as: B-D UF III MINI PEN NEEDLES Four times daily   isosorbide mononitrate 60 MG 24 hr tablet Commonly known as: IMDUR Take 1 tablet (60 mg total) by mouth daily.   losartan 50 MG tablet Commonly known as: COZAAR Take 50 mg by mouth daily.   nitroGLYCERIN 0.4 MG SL tablet Commonly known as: NITROSTAT Place 1 tablet (0.4 mg total) under the tongue every 5 (five) minutes as needed for chest pain.   NovoLOG FlexPen 100 UNIT/ML FlexPen Generic drug: insulin aspart Inject 5 Units into the skin 3 (three) times daily with meals. Max daily 21 units What changed: additional instructions Changed by: Dorita Sciara, MD   Nelva Nay Max SoloStar 300 UNIT/ML Sopn Generic drug: Insulin Glargine Inject 25 Units into the skin at bedtime.        OBJECTIVE:   Vital Signs: BP (!) 144/80 (BP Location: Left Arm, Patient Position: Sitting, Cuff Size: Large)   Pulse 69   Temp 98.3 F (36.8 C)   Ht 5\' 8"  (1.727 m)   Wt 187 lb 9.6 oz (85.1 kg)   SpO2 98%   BMI 28.52 kg/m   Wt Readings from Last 3 Encounters:  03/12/19 187 lb 9.6 oz (85.1 kg)  02/16/19 188 lb 12.8 oz (85.6 kg)  12/04/18 195 lb (88.5 kg)     Exam: General: Pt appears well and is in NAD  Lungs: Clear with good BS bilat with no rales, rhonchi, or wheezes  Heart: RRR with normal S1 and S2 and no gallops; no murmurs; no rub  Abdomen: Normoactive bowel sounds, soft, nontender, without masses or organomegaly palpable   Extremities: Trace edema   Neuro: MS is good with appropriate affect, pt is alert and Ox3       DM foot exam: 12/04/2018 The skin of the feet is intact without sores or ulcerations. Pt with thickened discolored toe nails The pedal pulses are 1 + on right and 1+ on left. The sensation is decreased on the left to a screening 5.07, 10 gram monofilament          DATA REVIEWED:  Lab Results  Component Value Date   HGBA1C 5.7 (A) 03/12/2019   HGBA1C 5.9 (A) 12/04/2018   HGBA1C 7.3 (A) 08/31/2018   Lab Results  Component Value Date   LDLCALC 82 11/07/2016  CREATININE 2.04 (H) 02/16/2019    ASSESSMENT / PLAN / RECOMMENDATIONS:   1) Type 2 diabetes Mellitus, With CKD III, retinopathy, and neuropathic complications - Most recent A1c of 5.7 %. Goal A1c < 7.0 %.     -His A1c is skewed due to CKD  - He  Denies hypoglycemic episodes  - There's insulin- CHO mismatch with BG's fluctuating between low 100's and > 200 mg/dL , will provide a correction scale as below  -We have  discussed adding a GLP-1 agonist in the past  to see if we can reduce his insulin doses more in there past , but the patient is satisfied with his current regimen and would like to continue.  MEDICATIONS:  Continue Toujeo 25 units daily   NovoLog 5 units 3 times daily before every meal   If Pre-meal BG > 200 mg/dL, will add 2 units of Novolog to that meal   EDUCATION / INSTRUCTIONS:  BG monitoring instructions: Patient is instructed to check his blood sugars 3 times a day, before meals and bedtime.  Call [facility] if: BG persistently < 70 or > 300. . I reviewed the Rule of 15 for the treatment of hypoglycemia in detail with the patient. Literature supplied.    2) Diabetic complications:   Eye: Unsure if he has diabetic retinopathy. He has cataract and glaucoma  Neuro/ Feet: Does have known diabetic peripheral neuropathy.  Renal: Patient does  have known baseline CKD. He is not on  an ACEI/ARB at present.   3) Lipids: Patient is on Lipitor 20 mg daily . His latest lipid profile not available to me at this time  4) Hypertension: He is above goal of < 140/90 mmHg.Pt is not sure if he took his meds today . Encouraged compliance.    F/U in 3 months   Signed electronically by: Mack Guise, MD  [facility] Endocrinology  [address] [place], [place] [address] Phone: [phone] FAX: [phone]   CC: [name], [place] [address] Phone: [phone]  Fax: [phone]  Return to Endocrinology clinic as below: Future Appointments  Date Time Provider [place]  03/12/2019  9:00 AM Clydell Hakim, RD [place] [facility]DM  07/16/2019  7:30 AM Lavonte Palos, Melanie Crazier, MD [facility] None

## 2019-03-12 NOTE — Progress Notes (Addendum)
Diabetes Self-Management Education  Visit Type: Follow-up  Appt. Start Time: 0910 Appt. End Time: 0940  03/12/2019  [name], identified by name and date of birth, is a 68 y.o. male with a diagnosis of Diabetes:  Type 2.   ASSESSMENT History includes HTN, CKD3, HTN, hypertrophic cardiomyopathy, and type 2 diabetes (since 2014 and insulin since diagnosis). He states that he feels his diet needs to be changed. He uses a lot of oil in cooking, chicken skin on wings, increased amounts of rice (although brown). He snacks out of habit. Regular syrup on pancakes.  Since last visit 3 months ago, his A1C has decreased to 5.7% from 09/10/18 to 5.9% 12/04/18.   Medications include Novolog  5 units before each meal (decreased form 6 to 5 units today), Toujeo 25 units q HS.  He is now to add a premeal correction dose of 2 units Novolog for any BG of greater than 200. He states that he is now avoiding snacking, cut back on his oil.  He has lost 8 lbs in the past 3 months. He takes the steps when doing laundry but otherwise is not getting regular exercise. GFR 32 8/25, BUN 31, Creatinine 2.04, Potassium 4.1. 02/16/2019  One Touch Verio Flex Meter provided. Exp 08/22/2019 Lot #: [id number] x Set this up and showed him how to use this.  Prescription request for strips sent to [facility].  Patient lives alone. 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 Originally from [place]   Weight 195 lb (88.5 kg).  11/2018 Body mass index is 29.65 kg/m. Weight 187 lb (84.8 kg). 03/13/2019 Body mass index is 28.43 kg/m.  Diabetes Self-Management Education - 03/12/19 0950      Visit Information   Visit Type  Follow-up      Psychosocial Assessment   Patient Belief/Attitude about Diabetes  Motivated to manage diabetes    Self-care barriers  None    Self-management support  Doctor's office    Other persons present  Patient    Learning Readiness  Ready       Pre-Education Assessment   Patient understands the diabetes disease and treatment process.  Demonstrates understanding / competency    Patient understands incorporating nutritional management into lifestyle.  Needs Review    Patient undertands incorporating physical activity into lifestyle.  Needs Review    Patient understands using medications safely.  Demonstrates understanding / competency    Patient understands monitoring blood glucose, interpreting and using results  Demonstrates understanding / competency    Patient understands prevention, detection, and treatment of acute complications.  Demonstrates understanding / competency    Patient understands prevention, detection, and treatment of chronic complications.  Demonstrates understanding / competency    Patient understands how to develop strategies to address psychosocial issues.  Demonstrates understanding / competency    Patient understands how to develop strategies to promote health/change behavior.  Needs Review      Complications   Last HgB A1C per patient/outside source  5.7 %   9/19 and 5.9 on last visit   How often do you check your blood sugar?  3-4 times/day    Fasting Blood glucose range (mg/dL)  130-179      Dietary Intake   Breakfast  plain waffle with butter    Lunch  spaghetti with meatballs, carrots    Dinner  2 slices toast with butter and jelly    Beverage(s)  water, gatorade  Exercise   Exercise Type  Light (walking / raking leaves)      Patient Education   Previous Diabetes Education  Yes (please comment)    Nutrition management   Meal options for control of blood glucose level and chronic complications.    Physical activity and exercise   Role of exercise on diabetes management, blood pressure control and cardiac health.;Identified with patient nutritional and/or medication changes necessary with exercise.    Medications  Reviewed patients medication for diabetes, action, purpose, timing of dose  and side effects.    Monitoring  Taught/evaluated SMBG meter.      Individualized Goals (developed by patient)   Nutrition  General guidelines for healthy choices and portions discussed    Physical Activity  Exercise 3-5 times per week;30 minutes per day    Medications  take my medication as prescribed    Monitoring   test my blood glucose as discussed      Patient Self-Evaluation of Goals - Patient rates self as meeting previously set goals (% of time)   Nutrition  >75%    Physical Activity  25 - 50%    Medications  >75%    Monitoring  >75%    Problem Solving  >75%    Reducing Risk  >75%    Health Coping  >75%      Post-Education Assessment   Patient understands the diabetes disease and treatment process.  Demonstrates understanding / competency    Patient understands incorporating nutritional management into lifestyle.  Needs Review    Patient undertands incorporating physical activity into lifestyle.  Demonstrates understanding / competency    Patient understands using medications safely.  Demonstrates understanding / competency    Patient understands monitoring blood glucose, interpreting and using results  Demonstrates understanding / competency    Patient understands prevention, detection, and treatment of acute complications.  Demonstrates understanding / competency    Patient understands prevention, detection, and treatment of chronic complications.  Demonstrates understanding / competency    Patient understands how to develop strategies to address psychosocial issues.  Demonstrates understanding / competency    Patient understands how to develop strategies to promote health/change behavior.  Needs Review      Outcomes   Expected Outcomes  Demonstrated interest in learning. Expect positive outcomes    Future DMSE  3-4 months    Program Status  Completed      Subsequent Visit   Since your last visit have you continued or begun to take your medications as prescribed?  Yes     Since your last visit have you had your blood pressure checked?  Yes    Since your last visit have you experienced any weight changes?  Loss    Weight Loss (lbs)  8    Since your last visit, are you checking your blood glucose at least once a day?  Yes       Individualized Plan for Diabetes Self-Management Training:   Learning Objective:  Patient will have a greater understanding of diabetes self-management. Patient education plan is to attend individual and/or group sessions per assessed needs and concerns.   Plan:   Patient Instructions  Continue the changes that you have made:  Decreased oil  Decreased snacks  Avoiding syrup Drink water and avoid gatorade Consider choosing a fruit and vegetable with each meal. Consider walking for 30 minutes after lunch.   Expected Outcomes:  Demonstrated interest in learning. Expect positive outcomes  Education material provided:   If  problems or questions, patient to contact team via:  Phone  Future DSME appointment: 3-4 months

## 2019-03-12 NOTE — Patient Instructions (Addendum)
Continue the changes that you have made:  Decreased oil  Decreased snacks  Avoiding syrup Drink water and avoid gatorade Consider choosing a fruit and vegetable with each meal. Consider walking for 30 minutes after lunch.

## 2019-03-12 NOTE — Patient Instructions (Signed)
-   Continue Toujeo at 25 units daily - If your pre-meal sugar is less then 200, take 5 units of  Novolog, but if your pre-meal sugar is over 200 take 7 units of Novolog with that meal    HOW TO TREAT LOW BLOOD SUGARS (Blood sugar LESS THAN 70 MG/DL)  Please follow the RULE OF 15 for the treatment of hypoglycemia treatment (when your (blood sugars are less than 70 mg/dL)    STEP 1: Take 15 grams of carbohydrates when your blood sugar is low, which includes:   3-4 GLUCOSE TABS  OR  3-4 OZ OF JUICE OR REGULAR SODA OR  ONE TUBE OF GLUCOSE GEL     STEP 2: RECHECK blood sugar in 15 MINUTES STEP 3: If your blood sugar is still low at the 15 minute recheck --> then, go back to STEP 1 and treat AGAIN with another 15 grams of carbohydrates.

## 2019-03-15 ENCOUNTER — Telehealth: Payer: Self-pay | Admitting: *Deleted

## 2019-03-15 NOTE — Telephone Encounter (Signed)
3 day ZIO XT long term holter monitor to be mailed to the patients home.  Instructions reviewed briefly at they are included in the monitor kit.

## 2019-03-17 ENCOUNTER — Telehealth: Payer: Self-pay | Admitting: *Deleted

## 2019-03-17 NOTE — Telephone Encounter (Signed)
Staff message sent to [facility] to schedule sleep study. HTA auth received. Auth # [id number]. DOS 03/09/19 to 06/07/19.

## 2019-03-18 NOTE — Telephone Encounter (Signed)
Patient is scheduled for lab study on 03/30/19. Robert Chaney is scheduled for COVID screening on 03/27/19  9:55. Patient understands his sleep study will be done at [facility] sleep lab. Patient understands he will receive a sleep packet in a week or so. Patient understands to call if he does not receive the sleep packet in a timely manner. Patient agrees with treatment and thanked me for call.

## 2019-03-18 NOTE — Telephone Encounter (Signed)
RE: pre cert Lauralee Evener, CMA  Freada Bergeron, CMA        Ok to schedule sleep study. HTA auth received. Auth # [id number]. Dates of service 03/09/19 to 06/07/19.

## 2019-03-27 ENCOUNTER — Other Ambulatory Visit (HOSPITAL_COMMUNITY)
Admission: RE | Admit: 2019-03-27 | Discharge: 2019-03-27 | Disposition: A | Discharge status: Home / Self Care | Payer: PPO | Source: Ambulatory Visit | Attending: Cardiology | Admitting: Cardiology

## 2019-03-27 DIAGNOSIS — Z20828 Contact with and (suspected) exposure to other viral communicable diseases: Secondary | ICD-10-CM | POA: Insufficient documentation

## 2019-03-27 DIAGNOSIS — Z01812 Encounter for preprocedural laboratory examination: Secondary | ICD-10-CM | POA: Insufficient documentation

## 2019-03-29 LAB — NOVEL CORONAVIRUS, NAA (HOSP ORDER, SEND-OUT TO REF LAB; TAT 18-24 HRS): SARS-CoV-2, NAA: NOT DETECTED

## 2019-03-30 ENCOUNTER — Other Ambulatory Visit: Payer: Self-pay

## 2019-03-30 ENCOUNTER — Ambulatory Visit (HOSPITAL_BASED_OUTPATIENT_CLINIC_OR_DEPARTMENT_OTHER): Payer: PPO | Attending: Physician Assistant | Admitting: Cardiology

## 2019-03-30 DIAGNOSIS — G4733 Obstructive sleep apnea (adult) (pediatric): Secondary | ICD-10-CM | POA: Diagnosis not present

## 2019-03-30 DIAGNOSIS — I493 Ventricular premature depolarization: Secondary | ICD-10-CM | POA: Diagnosis not present

## 2019-03-30 DIAGNOSIS — Z79899 Other long term (current) drug therapy: Secondary | ICD-10-CM | POA: Insufficient documentation

## 2019-03-30 DIAGNOSIS — R0902 Hypoxemia: Secondary | ICD-10-CM | POA: Diagnosis not present

## 2019-03-30 DIAGNOSIS — I472 Ventricular tachycardia: Secondary | ICD-10-CM | POA: Diagnosis not present

## 2019-03-31 DIAGNOSIS — N189 Chronic kidney disease, unspecified: Secondary | ICD-10-CM | POA: Diagnosis not present

## 2019-03-31 DIAGNOSIS — N2581 Secondary hyperparathyroidism of renal origin: Secondary | ICD-10-CM | POA: Diagnosis not present

## 2019-03-31 DIAGNOSIS — I5030 Unspecified diastolic (congestive) heart failure: Secondary | ICD-10-CM | POA: Diagnosis not present

## 2019-03-31 DIAGNOSIS — N183 Chronic kidney disease, stage 3 unspecified: Secondary | ICD-10-CM | POA: Diagnosis not present

## 2019-03-31 DIAGNOSIS — D631 Anemia in chronic kidney disease: Secondary | ICD-10-CM | POA: Diagnosis not present

## 2019-03-31 DIAGNOSIS — I129 Hypertensive chronic kidney disease with stage 1 through stage 4 chronic kidney disease, or unspecified chronic kidney disease: Secondary | ICD-10-CM | POA: Diagnosis not present

## 2019-04-04 NOTE — Procedures (Signed)
Patient Name: Robert Chaney, Robert Chaney Date: 03/30/2019 Gender: Male D.O.B: 1950-07-08 Age (years): 75 Referring Provider: Ermalinda Barrios Height (inches): 14 Interpreting Physician: Fransico Him MD, ABSM Weight (lbs): 180 RPSGT: Laren Everts BMI: 27 MRN: [id number] Neck Size: 17.00  CLINICAL INFORMATION Sleep Study Type: Split Night CPAP  Indication for sleep study: Congestive Heart Failure, Diabetes, Excessive Daytime Sleepiness, Fatigue, Hypertension, Obesity, OSA, Re-Evaluation, Snoring, Witnessed Apneas  Epworth Sleepiness Score: 16  SLEEP STUDY TECHNIQUE As per the AASM Manual for the Scoring of Sleep and Associated Events v2.3 (April 2016) with a hypopnea requiring 4% desaturations.  The channels recorded and monitored were frontal, central and occipital EEG, electrooculogram (EOG), submentalis EMG (chin), nasal and oral airflow, thoracic and abdominal wall motion, anterior tibialis EMG, snore microphone, electrocardiogram, and pulse oximetry. Continuous positive airway pressure (CPAP) was initiated when the patient met split night criteria and was titrated according to treat sleep-disordered breathing.  MEDICATIONS Medications self-administered by patient taken the night of the study : AMLODIPINE, HYDRALAZINE  RESPIRATORY PARAMETERS Diagnostic Total AHI (/hr): 73.1  RDI (/hr):74.8  OA Index (/hr): 1.3  CA Index (/hr): 0.0 REM AHI (/hr): N/A  NREM AHI (/hr):73.1  Supine AHI (/hr):73.1  Non-supine AHI (/hr):0 Min O2 Sat (%):70.0  Mean O2 (%): 86.3  Time below 88% (min):78.1   Titration Optimal Pressure (cm):16  AHI at Optimal Pressure (/hr):0.0  Min O2 at Optimal Pressure (%):91.0 Supine % at Optimal (%):100  Sleep % at Optimal (%):41   SLEEP ARCHITECTURE The recording time for the entire night was 372.4 minutes.  During a baseline period of 153.7 minutes, the patient slept for 135.5 minutes in REM and nonREM, yielding a sleep efficiency of 88.1%. Sleep  onset after lights out was 0.8 minutes with a REM latency of N/A minutes. The patient spent 13.7% of the night in stage N1 sleep, 86.3% in stage N2 sleep, 0.0% in stage N3 and 0% in REM.  During the titration period of 213.5 minutes, the patient slept for 183.6 minutes in REM and nonREM, yielding a sleep efficiency of 86.0%. Sleep onset after CPAP initiation was 1.4 minutes with a REM latency of 20.0 minutes. The patient spent 13.6% of the night in stage N1 sleep, 62.1% in stage N2 sleep, 0.0% in stage N3 and 24.2% in REM.  CARDIAC DATA The 2 lead EKG demonstrated sinus rhythm. The mean heart rate was 100.0 beats per minute. Other EKG findings include: PVCs and nonsustained ventricular tachycardia.  LEG MOVEMENT DATA The total Periodic Limb Movements of Sleep (PLMS) were 0. The PLMS index was 0.0 .  IMPRESSIONS - Severe obstructive sleep apnea occurred during the diagnostic portion of the study (AHI = 73.1/hour). An optimal PAP pressure was selected for this patient ( 16 cm of water) - No significant central sleep apnea occurred during the diagnostic portion of the study (CAI = 0.0/hour). - Severe oxygen desaturation was noted during the diagnostic portion of the study (Min O2 = 70.0%). - The patient snored with moderate snoring volume during the diagnostic portion of the study. - EKG findings include PVCs and nonsustained ventricular tachycardia.  - Clinically significant periodic limb movements did not occur during sleep.  DIAGNOSIS - Obstructive Sleep Apnea (327.23 [G47.33 ICD-10]) - Nocturnal Hypoxemia - Ventricular Tachycardia  RECOMMENDATIONS - Trial of CPAP therapy on 16 cm H2O with a Medium size Fisher&Paykel Full Face Mask Simplus mask and heated humidification. - Avoid alcohol, sedatives and other CNS depressants that may worsen sleep apnea and disrupt  normal sleep architecture. - Sleep hygiene should be reviewed to assess factors that may improve sleep quality. - Weight  management and regular exercise should be initiated or continued. - Return to Sleep Center for re-evaluation after 10 weeks of therapy  [Electronically signed] 04/04/2019 07:14 PM  Fransico Him MD, ABSM Diplomate, American Board of Sleep Medicine

## 2019-04-06 ENCOUNTER — Telehealth: Payer: Self-pay | Admitting: *Deleted

## 2019-04-06 NOTE — Telephone Encounter (Signed)
Informed patient of sleep study results and patient understanding was verbalized. Patient understands his sleep study showed they have significant sleep apnea and had successful PAP titration and will be set up with PAP unit. Please let DME know that order is in EPIC. Please set patient up for OV in 10 weeks   Upon patient request DME selection is [facility]. Patient understands he will be contacted by [facility] to set up his cpap. Patient understands to call if [facility] does not contact him with new setup in a timely manner. Patient understands they will be called once confirmation has been received from [facility] that they have received their new machine to schedule 10 week follow up appointment.  [facility] notified of new cpap order  Please add to airview Patient was grateful for the call and thanked me.

## 2019-04-06 NOTE — Telephone Encounter (Signed)
-----   Message from Sueanne Margarita, MD sent at 04/04/2019  7:18 PM EDT ----- Please let patient know that they have significant sleep apnea and had successful PAP titration and will be set up with PAP unit.  Please let DME know that order is in EPIC.  Please set patient up for OV in 10 weeks

## 2019-04-18 ENCOUNTER — Other Ambulatory Visit: Payer: Self-pay | Admitting: Cardiology

## 2019-04-18 ENCOUNTER — Other Ambulatory Visit: Payer: Self-pay | Admitting: Physician Assistant

## 2019-04-18 DIAGNOSIS — I10 Essential (primary) hypertension: Secondary | ICD-10-CM

## 2019-04-18 DIAGNOSIS — N183 Chronic kidney disease, stage 3 unspecified: Secondary | ICD-10-CM

## 2019-04-18 DIAGNOSIS — N179 Acute kidney failure, unspecified: Secondary | ICD-10-CM

## 2019-04-18 DIAGNOSIS — I422 Other hypertrophic cardiomyopathy: Secondary | ICD-10-CM

## 2019-04-18 DIAGNOSIS — E118 Type 2 diabetes mellitus with unspecified complications: Secondary | ICD-10-CM

## 2019-04-20 ENCOUNTER — Ambulatory Visit (INDEPENDENT_AMBULATORY_CARE_PROVIDER_SITE_OTHER): Payer: PPO

## 2019-04-20 DIAGNOSIS — I493 Ventricular premature depolarization: Secondary | ICD-10-CM

## 2019-04-29 ENCOUNTER — Other Ambulatory Visit: Payer: Self-pay | Admitting: Cardiology

## 2019-04-30 DIAGNOSIS — H34831 Tributary (branch) retinal vein occlusion, right eye, with macular edema: Secondary | ICD-10-CM | POA: Diagnosis not present

## 2019-05-13 DIAGNOSIS — I493 Ventricular premature depolarization: Secondary | ICD-10-CM | POA: Diagnosis not present

## 2019-06-04 DIAGNOSIS — I10 Essential (primary) hypertension: Secondary | ICD-10-CM | POA: Diagnosis not present

## 2019-06-04 DIAGNOSIS — I509 Heart failure, unspecified: Secondary | ICD-10-CM | POA: Diagnosis not present

## 2019-06-04 DIAGNOSIS — E1122 Type 2 diabetes mellitus with diabetic chronic kidney disease: Secondary | ICD-10-CM | POA: Diagnosis not present

## 2019-06-04 DIAGNOSIS — E7849 Other hyperlipidemia: Secondary | ICD-10-CM | POA: Diagnosis not present

## 2019-06-09 ENCOUNTER — Ambulatory Visit: Payer: PPO | Admitting: Cardiology

## 2019-06-09 ENCOUNTER — Other Ambulatory Visit: Payer: Self-pay

## 2019-06-09 ENCOUNTER — Encounter: Payer: Self-pay | Admitting: Cardiology

## 2019-06-09 VITALS — BP 188/90 | HR 7 | Ht 68.0 in | Wt 186.0 lb

## 2019-06-09 DIAGNOSIS — I5032 Chronic diastolic (congestive) heart failure: Secondary | ICD-10-CM

## 2019-06-09 DIAGNOSIS — N1831 Chronic kidney disease, stage 3a: Secondary | ICD-10-CM

## 2019-06-09 DIAGNOSIS — I10 Essential (primary) hypertension: Secondary | ICD-10-CM | POA: Diagnosis not present

## 2019-06-09 DIAGNOSIS — I422 Other hypertrophic cardiomyopathy: Secondary | ICD-10-CM

## 2019-06-09 DIAGNOSIS — E118 Type 2 diabetes mellitus with unspecified complications: Secondary | ICD-10-CM | POA: Diagnosis not present

## 2019-06-09 DIAGNOSIS — G4733 Obstructive sleep apnea (adult) (pediatric): Secondary | ICD-10-CM

## 2019-06-09 DIAGNOSIS — E785 Hyperlipidemia, unspecified: Secondary | ICD-10-CM | POA: Diagnosis not present

## 2019-06-09 MED ORDER — HYDRALAZINE HCL 50 MG PO TABS: 75.0000 mg | ORAL_TABLET | Freq: Three times a day (TID) | ORAL | 3 refills | Status: DC

## 2019-06-09 MED ORDER — FUROSEMIDE 20 MG PO TABS: 20.0000 mg | ORAL_TABLET | Freq: Every day | ORAL | 3 refills | Status: DC

## 2019-06-09 NOTE — Patient Instructions (Addendum)
Medication Instructions:  Increase: Hydralazine, Take 1.5 tablets (75 mg total) by mouth 3 times a day   *If you need a refill on your cardiac medications before your next appointment, please call your pharmacy*  Lab Work: None ordered   If you have labs (blood work) drawn today and your tests are completely normal, you will receive your results only by: Marland Kitchen MyChart Message (if you have MyChart) OR . A paper copy in the mail If you have any lab test that is abnormal or we need to change your treatment, we will call you to review the results.  Testing/Procedures: None ordered   Follow-Up: You are scheduled to see Pecolia Ades, NP on 08/10/2019 @ 1:30  Other Instructions None ordered

## 2019-06-09 NOTE — Telephone Encounter (Addendum)
Robert Chaney at [facility] called to say the patient has cancelled his appointment to be set up 6 times with the latest time being today  so they are not going to reschedule him anymore. [facility] has encouraged him to find another dme to set hm up.

## 2019-06-09 NOTE — Progress Notes (Signed)
Cardiology Office Note:    Date:  06/09/2019   ID:  Robert Chaney, DOB May 16, 1951, MRN [id number]  PCP:  [name], MD  Cardiologist:  Ena Dawley, MD  Referring MD: [name], MD   Chief Complaint  Patient presents with  . Follow-up    HOCM  . Hypertension  . Hyperlipidemia  . Congestive Heart Failure    History of Present Illness:    Robert Chaney is a 68 y.o. male with a past medical history significant for hypertension, diastolic CHF, hypertrophic cardiomyopathy, hyperlipidemia, OSA on CPAP, diabetes type 2, CKD stage IV.  He has a history of hypertrophic cardiomyopathy with septal thickness 21 mm without significant gradient and no S.A.M.  MRI 10/2016 with the presence of LGE and 8 myocardial segments upper septal thickness 21 mm.  Patient has been asymptomatic and no history of syncope.   The patient has prior diagnosis of OSA but had not been fitted for CPAP.  He had a positive sleep study in 03/2019 and is now recommended for CPAP. He has not obtained CPAP. He lost his sister around the time and he did not keep his appointments. He now says that he will now work with another company for CPAP.   Robert Chaney is here today for follow up. He works part-time caring for some one. He is not doing much exercise. He does have a stepper at home and uses it almost every day, unknown duration. He does not walk much outside. He denies chest pain or shortness of breath. He denies orthopnea, PND, palpitations, lightheadedness or edema.   He checks his BP at home up to 3 times per day, usually 150's/70's-80's. He says his BP was 158 at his recent PCP visit.  He says that his hydralazine was increased from 50 to 75 mg a few days ago but he has not picked up the med yet.  We called his pharmacy to confirm that this was done and they had no record of an increase in his hydralazine, which was still at 50 mg 3 times daily.  I asked the patient about cholesterol and he says it has not been  checked in several years.  There are no levels in epic since 2018.   Cardiac studies   7 Day heart monitor ending 05/24/2019  Sinus bradycardia to sinus tachycardia. Average HR 70 BPM.  18 Supraventricular Tachycardia runs, the longest lasting 30 seconds with HR 156 BPM.  Rare PVCs (< 1%), 3 couplets and 1 triplet.   10 day event monitor, no symptoms. Predominant rhythm was sinus rhythm with average rate 70 BPM. 18 Supraventricular Tachycardia runs, the longest lasting 30 seconds with HR 156 BPM.  Rare PVCs (< 1%), 3 couplets and 1 triplet. No ventricular arrhythmias.  Echocardiogram 02/19/2019 IMPRESSIONS   1. The left ventricle has normal systolic function with an ejection fraction of 60-65%. The cavity size was normal. There is severe increase LV wall thickness. Findings are consistent with hypertrophic cardiomyopathy. Peak LVOT velocity at rest was  80mmHg that increased to 66mmHg with Valsalva. Left ventricular diastolic Doppler parameters are consistent with pseudonormalization. Elevated left ventricular end-diastolic pressure No evidence of left ventricular regional wall motion abnormalities.  2. The right ventricle has normal systolic function. The cavity was normal. There is no increase in right ventricular wall thickness.  3. Left atrial size was mildly dilated.  4. The pericardial effusion is circumferential.  5. Trivial pericardial effusion is present.  6. The aortic valve is  tricuspid. Mild sclerosis of the aortic valve.  7. There is mild dilatation of the aortic root measuring 39 mm.  8. Findings consistent with HOCM. There is a dynamic LVOT gradient of 19mmHg at rest that increased to 58mmHg with Valsalva. No leaflet or chordal SAM.  Lexiscan Myoview 07/25/2017 Study Highlights   Electrically nondiagnositic due to baseline LVH with strain  Probable normal perfusion and soft tissue attenuation (diaphragm) No ischemia  LVEF 585  Low risk scan   Cardiac MRI 2018    FINDINGS: 1. Normal left ventricular size with severe left ventricular hypertrophy with basal septal thickness of 21 mm and normal systolic function (LVEF = 62%). There are no regional wall motion abnormalities.  There are diffuse patchy late gadolinium enhancement in the basal anterior, anteroseptal, inferior, inferolateral, mid anterior, inferior, inferolateral and apical inferior walls.  LVEDD; 50 mm  LVESD: 34 mm  LVEDV: 139 ml  LVESV: 52 ml  SV: 87 ml  CO: 5.8 L/min  Myocardial mass: 256 g  2. Normal right ventricular size, thickness and systolic volume.  3. Mild mitral and tricuspid regurgitation.  4. Normal size of the aortic root, thoracic aorta and pulmonary artery.  5. Normal pericardium no pericardial effusion.  IMPRESSION: 1. Normal left ventricular size with severe left ventricular hypertrophy with basal septal thickness of 21 mm and normal systolic function (LVEF = 62%). There are no regional wall motion abnormalities.  There are diffuse patchy late gadolinium enhancement in the basal anterior, anteroseptal, inferior, inferolateral, mid anterior, inferior, inferolateral and apical inferior walls.  2. Normal right ventricular size, thickness and systolic volume.  3. Mild mitral and tricuspid regurgitation.  Collectively, these findings are consistent with hypertrophic cardiomyopathy with high risk feature - presence of LGE in the 8 myocardial segments.  Ena Dawley   Past Medical History:  Diagnosis Date  . Altered mental status    a. 05/2017 - adm with blurred vision, somnolence in setting of AKI and high blood sugar.  . Anemia   . CKD (chronic kidney disease), stage III   . Diabetes mellitus   . Diastolic CHF, chronic (HCC)    A.  03/2009 Echo: EF 60-65%, Gr II diast dysfxn  . Elevated troponin    a. 2013 - troponin 1.4. b. 2019 - troponin 0.32; neg nuc 07/2017.  . Glaucoma   . Hyperlipidemia     HYPERCHOLESTEROLEMIA  . Hypertension    MARKED LEFT VENTRICULAR HYPERTROPHY BY PREVIOUS ECHOCARDIOGRAM--HE HAS HYPERDYNAMIC LEFT VENTRICULAR SYSTOLIC FUNCTION AND HAS IMPAIRED RELAXATION BY ECHO  . Hypertrophic cardiomyopathy ([place])   . NSVT (nonsustained ventricular tachycardia) ([place])   . Premature atrial contractions   . PVC's (premature ventricular contractions)   . SVT (supraventricular tachycardia) (HCC)     Past Surgical History:  Procedure Laterality Date  . NO PAST SURGERIES      Current Medications: Current Meds  Medication Sig  . amLODipine (NORVASC) 10 MG tablet Take 1 tablet (10 mg total) by mouth at bedtime.  Marland Kitchen atorvastatin (LIPITOR) 20 MG tablet Take 1 tablet (20 mg total) by mouth daily.  . carvedilol (COREG) 25 MG tablet Take 1 tablet (25 mg total) by mouth 2 (two) times daily.  . cholecalciferol (VITAMIN D3) 25 MCG (1000 UT) tablet Take 1,000 Units by mouth daily.  . furosemide (LASIX) 20 MG tablet Take 1 tablet (20 mg total) by mouth daily.  Marland Kitchen glucose blood (ONETOUCH VERIO) test strip Use as instructed to test blood sugar 3 times daily E11.65  .  hydrALAZINE (APRESOLINE) 50 MG tablet Take 1.5 tablets (75 mg total) by mouth 3 (three) times daily.  . insulin aspart (NOVOLOG FLEXPEN) 100 UNIT/ML FlexPen Inject 5 Units into the skin 3 (three) times daily with meals. Max daily 21 units  . Insulin Glargine (TOUJEO MAX SOLOSTAR) 300 UNIT/ML SOPN Inject 25 Units into the skin at bedtime.  . Insulin Pen Needle (B-D UF III MINI PEN NEEDLES) 31G X 5 MM MISC Four times daily  . isosorbide mononitrate (IMDUR) 60 MG 24 hr tablet Take 1 tablet (60 mg total) by mouth daily.  Marland Kitchen losartan (COZAAR) 50 MG tablet Take 50 mg by mouth daily.  . nitroGLYCERIN (NITROSTAT) 0.4 MG SL tablet Place 1 tablet (0.4 mg total) under the tongue every 5 (five) minutes as needed for chest pain.  . [DISCONTINUED] furosemide (LASIX) 20 MG tablet Take 1 tablet by mouth once daily  . [DISCONTINUED] hydrALAZINE  (APRESOLINE) 50 MG tablet TAKE 1 TABLET BY MOUTH THREE TIMES DAILY  . [DISCONTINUED] hydrALAZINE (APRESOLINE) 50 MG tablet Take 50 mg by mouth 3 (three) times daily.     Allergies:   Aspirin   Social History   Socioeconomic History  . Marital status: Legally Separated    Spouse name: Not on file  . Number of children: Not on file  . Years of education: Not on file  . Highest education level: Not on file  Occupational History  . Occupation: Forensic psychologist: [employer]    Comment: At [employer]  . Smoking status: Never Smoker  . Smokeless tobacco: Never Used  Substance and Sexual Activity  . Alcohol use: No  . Drug use: No  . Sexual activity: Never  Other Topics Concern  . Not on file  Social History Narrative   Originally from [place].    Social Determinants of Health   Financial Resource Strain:   . Difficulty of Paying Living Expenses: Not on file  Food Insecurity:   . Worried About Charity fundraiser in the Last Year: Not on file  . Ran Out of Food in the Last Year: Not on file  Transportation Needs:   . Lack of Transportation (Medical): Not on file  . Lack of Transportation (Non-Medical): Not on file  Physical Activity:   . Days of Exercise per Week: Not on file  . Minutes of Exercise per Session: Not on file  Stress:   . Feeling of Stress : Not on file  Social Connections:   . Frequency of Communication with Friends and Family: Not on file  . Frequency of Social Gatherings with Friends and Family: Not on file  . Attends Religious Services: Not on file  . Active Member of Clubs or Organizations: Not on file  . Attends Archivist Meetings: Not on file  . Marital Status: Not on file     Family History: The patient's family history includes Diabetes in his brother and brother; Hypertension in his father. ROS:   Please see the history of present illness.     All other systems reviewed and are negative.   EKG:  EKG is  not ordered today.    Recent Labs: 02/16/2019: BUN 31; Creatinine, Ser 2.04; Magnesium 2.0; Potassium 4.1; Sodium 141   Recent Lipid Panel    Component Value Date/Time   CHOL 157 11/07/2016 0922   TRIG 81 11/07/2016 0922   HDL 59 11/07/2016 0922   CHOLHDL 2.7 11/07/2016 0922   CHOLHDL 3  05/04/2014 0852   VLDL 13.6 05/04/2014 0852   LDLCALC 82 11/07/2016 0922    Physical Exam:    VS:  BP (!) 188/90   Pulse (!) 7   Ht 5\' 8"  (1.727 m)   Wt 186 lb (84.4 kg)   BMI 28.28 kg/m     Wt Readings from Last 6 Encounters:  06/09/19 186 lb (84.4 kg)  03/30/19 180 lb (81.6 kg)  03/12/19 187 lb (84.8 kg)  03/12/19 187 lb 9.6 oz (85.1 kg)  02/16/19 188 lb 12.8 oz (85.6 kg)  12/04/18 195 lb (88.5 kg)     Physical Exam  Constitutional: He is oriented to person, place, and time. He appears well-developed and well-nourished. No distress.  HENT:  Head: Normocephalic.  Neck: No JVD present.  Cardiovascular: Normal rate, regular rhythm, normal heart sounds and intact distal pulses. Exam reveals no gallop and no friction rub.  No murmur heard. Pulmonary/Chest: Effort normal and breath sounds normal. No respiratory distress. He has no wheezes. He has no rales.  Abdominal: Soft. Bowel sounds are normal.  Musculoskeletal:        General: No edema. Normal range of motion.     Cervical back: Normal range of motion and neck supple.  Neurological: He is alert and oriented to person, place, and time.  Skin: Skin is warm and dry.  Psychiatric: He has a normal mood and affect. His behavior is normal. Judgment and thought content normal.  Vitals reviewed.    ASSESSMENT:    1. Hypertrophic cardiomyopathy ([place])   2. Essential hypertension   3. Diastolic CHF, chronic ([place])   4. OSA (obstructive sleep apnea)   5. Hyperlipidemia, unspecified hyperlipidemia type   6. DM (diabetes mellitus) with complications ([place])   7. Stage 3a chronic kidney disease    PLAN:    In order of problems listed  above:  Hypertrophic cardiomyopathy -Septal thickness 21 mm on MRI 10/2016.  No syncopal episodes.  No chest pain or palpitations.  PVCs -Patient noted to have frequent PVCs on EKG in 01/2019.  7-day monitor showed rare PVCs. Continues on carvedilol.  -Rhythm is regular and steady today.  He denies palpitations.  Essential hypertension -On amlodipine 10 mg, carvedilol 25 mg, hydralazine 50 mg 3 times daily, Imdur 60 mg daily and losartan 50 mg daily -BP elevated.  He reports blood pressures at home are slightly better controlled in the 150s over 70s-80s.  He reports that his hydralazine was increased to 75 mg 3 times daily by his PCP recently but he had not picked up the medication.  His pharmacy does not have any record of this increase so I have sent in a prescription for increase in hydralazine to 75 mg 3 times daily.  I reviewed goal blood pressure less than 130/80 with the patient and he verbalizes understanding. -Treating OSA may help BP. -Pt does not add salt. Advised to avoid hidden sodium.   Chronic diastolic CHF -Currently asymptomatic. On lasix 20 mg.  OSA -Has now been diagnosed and CPAP recommended. He is having trouble getting CPAP as he cancelled too many appt. Now to go with another complany. I strongly advised him to get set up as time will run out.  He says that he will.  Dyslipidemia -LDL 82 in 10/2016, on atorvastatin 20 mg daily.  Continue current therapy. -Will update lipids.   Diabetes type 2 -Followed by endocrinology. -A1c 5.7 in 02/2019  CKD stage III -SCr 2.04 in 01/2019 -Follows with nephrology, last  seen in Oct or Nov. Follow up in 6 months.    Medication Adjustments/Labs and Tests Ordered: Current medicines are reviewed at length with the patient today.  Concerns regarding medicines are outlined above. Labs and tests ordered and medication changes are outlined in the patient instructions below:  Patient Instructions  Medication Instructions:  Increase:  Hydralazine, Take 1.5 tablets (75 mg total) by mouth 3 times a day   *If you need a refill on your cardiac medications before your next appointment, please call your pharmacy*  Lab Work: None ordered   If you have labs (blood work) drawn today and your tests are completely normal, you will receive your results only by: Marland Kitchen MyChart Message (if you have MyChart) OR . A paper copy in the mail If you have any lab test that is abnormal or we need to change your treatment, we will call you to review the results.  Testing/Procedures: None ordered   Follow-Up: You are scheduled to see Pecolia Ades, NP on 08/10/2019 @ 1:30  Other Instructions None ordered      Signed, Daune Perch, NP  06/09/2019 2:48 PM    [facility]

## 2019-06-11 DIAGNOSIS — H34831 Tributary (branch) retinal vein occlusion, right eye, with macular edema: Secondary | ICD-10-CM | POA: Diagnosis not present

## 2019-07-09 DIAGNOSIS — H34831 Tributary (branch) retinal vein occlusion, right eye, with macular edema: Secondary | ICD-10-CM | POA: Diagnosis not present

## 2019-07-14 ENCOUNTER — Other Ambulatory Visit: Payer: Self-pay | Admitting: Internal Medicine

## 2019-07-14 DIAGNOSIS — E1142 Type 2 diabetes mellitus with diabetic polyneuropathy: Secondary | ICD-10-CM

## 2019-07-14 DIAGNOSIS — Z794 Long term (current) use of insulin: Secondary | ICD-10-CM

## 2019-07-15 NOTE — Progress Notes (Signed)
Name: Robert Chaney  Age/ Sex: 69 y.o., male   MRN/ DOB: [id number], 1951-05-24     PCP: [name], MD   Reason for Endocrinology Evaluation: Type 2 Diabetes Mellitus  Initial Endocrine Consultative Visit: 06/30/2018    PATIENT IDENTIFIER: Robert Chaney is a 69 y.o. male with a past medical history of T2DM, HTN, hypertrophic cardiomyopathy, and CKD III. The patient has followed with Endocrinology clinic since 06/30/2018 for consultative assistance with management of his diabetes.  DIABETIC HISTORY:  Robert Chaney was diagnosed with T2DM Since 2014, started insulin therapy at diagnosis. His hemoglobin A1c has ranged from 6.9%  in 2015, peaking at 13.0%  In 2019.  Linagliptin discontinued in 06/2018 due to cost    Saw Antonieta Iba 08/2018 SUBJECTIVE:   During the last visit (03/12/2019): We continued MDI regimen    Today (07/16/2019): Robert Chaney is here for 53-month follow-up on his diabetes management. He checks his blood sugars 2  times daily, preprandial. The patient has not had hypoglycemic episodes since the last clinic visit. Otherwise, the patient has not required any recent emergency interventions for hypoglycemia and has not had recent hospitalizations secondary to hyper or hypoglycemic episodes.    ROS: As per HPI and as detailed below: Review of Systems  Constitutional: Negative for fever.  HENT: Negative for congestion and sore throat.   Respiratory: Negative for cough and shortness of breath.   Cardiovascular: Negative for chest pain and palpitations.  Gastrointestinal: Negative for diarrhea and nausea.      HOME DIABETES REGIMEN:   Toujeo 25 units daily - did not take for a month   Novolog 5 units TIDQAC     METER DOWNLOAD SUMMARY:1/9-1/22/2021 Fingerstick Blood Glucose Tests = 10 Average checks per day = 0.7 Overall Mean FS Glucose = 151  BG Ranges: Low = 107 High = 247   Hypoglycemic Events/30 Days: BG < 50 = 0 Episodes of symptomatic severe  hypoglycemia = 0    DIABETIC COMPLICATIONS: Microvascular complications:   Neuropathy, CKD III, retinopathy on the right   Last eye exam: Completed 05/2019 (S/P right eye retinopathy  Macrovascular complications:    Denies: CAD, PVD, CVA  HISTORY:  Past Medical History:  Past Medical History:  Diagnosis Date  . Altered mental status    a. 05/2017 - adm with blurred vision, somnolence in setting of AKI and high blood sugar.  . Anemia   . CKD (chronic kidney disease), stage III   . Diabetes mellitus   . Diastolic CHF, chronic (HCC)    A.  03/2009 Echo: EF 60-65%, Gr II diast dysfxn  . Elevated troponin    a. 2013 - troponin 1.4. b. 2019 - troponin 0.32; neg nuc 07/2017.  . Glaucoma   . Hyperlipidemia    HYPERCHOLESTEROLEMIA  . Hypertension    MARKED LEFT VENTRICULAR HYPERTROPHY BY PREVIOUS ECHOCARDIOGRAM--HE HAS HYPERDYNAMIC LEFT VENTRICULAR SYSTOLIC FUNCTION AND HAS IMPAIRED RELAXATION BY ECHO  . Hypertrophic cardiomyopathy ([place])   . NSVT (nonsustained ventricular tachycardia) ([place])   . Premature atrial contractions   . PVC's (premature ventricular contractions)   . SVT (supraventricular tachycardia) (HCC)    Past Surgical History:  Past Surgical History:  Procedure Laterality Date  . NO PAST SURGERIES      Social History:  reports that he has never smoked. He has never used smokeless tobacco. He reports that he does not drink alcohol or use drugs. Family History:  Family History  Problem Relation Age of Onset  .  Hypertension Father   . Diabetes Brother   . Diabetes Brother      HOME MEDICATIONS: Allergies as of 07/16/2019      Reactions   Aspirin Itching   itching      Medication List       Accurate as of July 16, 2019  7:34 AM. If you have any questions, ask your nurse or doctor.        amLODipine 10 MG tablet Commonly known as: NORVASC Take 1 tablet (10 mg total) by mouth at bedtime.   atorvastatin 20 MG tablet Commonly known as:  LIPITOR Take 1 tablet (20 mg total) by mouth daily.   carvedilol 25 MG tablet Commonly known as: COREG Take 1 tablet (25 mg total) by mouth 2 (two) times daily.   cholecalciferol 25 MCG (1000 UNIT) tablet Commonly known as: VITAMIN D3 Take 1,000 Units by mouth daily.   furosemide 20 MG tablet Commonly known as: LASIX Take 1 tablet (20 mg total) by mouth daily.   hydrALAZINE 50 MG tablet Commonly known as: APRESOLINE Take 1.5 tablets (75 mg total) by mouth 3 (three) times daily.   Insulin Pen Needle 31G X 5 MM Misc Commonly known as: B-D UF III MINI PEN NEEDLES Four times daily   isosorbide mononitrate 60 MG 24 hr tablet Commonly known as: IMDUR Take 1 tablet (60 mg total) by mouth daily.   losartan 50 MG tablet Commonly known as: COZAAR Take 50 mg by mouth daily.   nitroGLYCERIN 0.4 MG SL tablet Commonly known as: NITROSTAT Place 1 tablet (0.4 mg total) under the tongue every 5 (five) minutes as needed for chest pain.   NovoLOG FlexPen 100 UNIT/ML FlexPen Generic drug: insulin aspart Inject 5 Units into the skin 3 (three) times daily with meals. Max daily 21 units What changed: how much to take   OneTouch Verio test strip Generic drug: glucose blood Use as instructed to test blood sugar 3 times daily E11.65   Toujeo Max SoloStar 300 UNIT/ML Sopn Generic drug: Insulin Glargine Inject 25 Units into the skin at bedtime.        OBJECTIVE:   Vital Signs: BP (!) 162/84 (BP Location: Left Arm, Patient Position: Sitting, Cuff Size: Large)   Pulse 67   Temp 98.4 F (36.9 C)   Ht 5\' 8"  (1.727 m)   Wt 191 lb 12.8 oz (87 kg)   SpO2 91%   BMI 29.16 kg/m   Wt Readings from Last 3 Encounters:  07/16/19 191 lb 12.8 oz (87 kg)  06/09/19 186 lb (84.4 kg)  03/30/19 180 lb (81.6 kg)     Exam: General: Pt appears well and is in NAD  Lungs: Clear with good BS bilat with no rales, rhonchi, or wheezes  Heart: RRR with normal S1 and S2 and no gallops; no murmurs; no  rub  Abdomen: Normoactive bowel sounds, soft, nontender, without masses or organomegaly palpable  Extremities: Trace edema   Neuro: MS is good with appropriate affect, pt is alert and Ox3       DM foot exam: 07/16/2019 The skin of the feet is intact without sores or ulcerations. Pt with thickened discolored and curved toe nails The pedal pulses are 1 + on right and 1+ on left. The sensation is intact to a screening 5.07, 10 gram monofilament       DATA REVIEWED:  Lab Results  Component Value Date   HGBA1C 5.9 (A) 07/16/2019   HGBA1C 5.7 (A) 03/12/2019  HGBA1C 5.9 (A) 12/04/2018   Lab Results  Component Value Date   LDLCALC 82 11/07/2016   CREATININE 2.04 (H) 02/16/2019   11/27/2018 Bun 29 Cr 1.74 GFR 45  ASSESSMENT / PLAN / RECOMMENDATIONS:   1) Type 2 diabetes Mellitus, With CKD III, retinopathy, and neuropathic complications - Most recent A1c of 5.9 %. Goal A1c < 7.0 %.     -His A1c is skewed due to CKD  - He  Denies hypoglycemic episodes but he had stopped taking basal insulin ~ 1 month ago, he though he didn't need it, there's a concern about cost as well, we discussed [employer] brand options or pt assistance program. Pt would like to return to taking it, but will reduce his dose as below  -We have  discussed adding a GLP-1 agonist in the past  to see if we can reduce his insulin doses more in there past , but the patient is satisfied with his current regimen and would like to continue.  MEDICATIONS:  Restart Toujeo at 20 units daily   NovoLog 5 units 3 times daily before every meal   If Pre-meal BG > 200 mg/dL, will add 2 units of Novolog to that meal   EDUCATION / INSTRUCTIONS:  BG monitoring instructions: Patient is instructed to check his blood sugars 3 times a day, before meals and bedtime.  Call [facility] if: BG persistently < 70 or > 300. . I reviewed the Rule of 15 for the treatment of hypoglycemia in detail with the patient. Literature  supplied.    2) Diabetic complications:   Eye: He has diabetic retinopathy. S/P injections to the right eye   Neuro/ Feet: Does have known diabetic peripheral neuropathy.Has an upcoming appointment with podiatry  Renal: Patient does  have known baseline CKD. He is not on an ACEI/ARB at present.     F/U in 4 months   Signed electronically by: Mack Guise, MD  [facility]  [facility] [address] [place], [place] [address] Phone: [phone] FAX: [phone]   CC: [name], [place] [address] Phone: [phone]  Fax: [phone]  Return to Endocrinology clinic as below: Future Appointments  Date Time Provider [place]  07/16/2019  9:00 AM Clydell Hakim, RD [place] [facility]  08/10/2019  1:30 PM Daune Perch, NP [id number]

## 2019-07-16 ENCOUNTER — Encounter: Payer: PPO | Attending: Internal Medicine | Admitting: Dietician

## 2019-07-16 ENCOUNTER — Encounter: Payer: Self-pay | Admitting: Internal Medicine

## 2019-07-16 ENCOUNTER — Ambulatory Visit (INDEPENDENT_AMBULATORY_CARE_PROVIDER_SITE_OTHER): Payer: PPO | Admitting: Internal Medicine

## 2019-07-16 ENCOUNTER — Other Ambulatory Visit: Payer: Self-pay

## 2019-07-16 VITALS — BP 162/84 | HR 67 | Temp 98.4°F | Ht 68.0 in | Wt 191.8 lb

## 2019-07-16 DIAGNOSIS — E118 Type 2 diabetes mellitus with unspecified complications: Secondary | ICD-10-CM

## 2019-07-16 DIAGNOSIS — Z794 Long term (current) use of insulin: Secondary | ICD-10-CM | POA: Insufficient documentation

## 2019-07-16 DIAGNOSIS — E1121 Type 2 diabetes mellitus with diabetic nephropathy: Secondary | ICD-10-CM

## 2019-07-16 DIAGNOSIS — E1142 Type 2 diabetes mellitus with diabetic polyneuropathy: Secondary | ICD-10-CM | POA: Insufficient documentation

## 2019-07-16 DIAGNOSIS — N1831 Chronic kidney disease, stage 3a: Secondary | ICD-10-CM

## 2019-07-16 DIAGNOSIS — E11319 Type 2 diabetes mellitus with unspecified diabetic retinopathy without macular edema: Secondary | ICD-10-CM | POA: Diagnosis not present

## 2019-07-16 DIAGNOSIS — E1122 Type 2 diabetes mellitus with diabetic chronic kidney disease: Secondary | ICD-10-CM | POA: Insufficient documentation

## 2019-07-16 DIAGNOSIS — N184 Chronic kidney disease, stage 4 (severe): Secondary | ICD-10-CM | POA: Insufficient documentation

## 2019-07-16 LAB — POCT GLYCOSYLATED HEMOGLOBIN (HGB A1C): Hemoglobin A1C: 5.9 % — AB (ref 4.0–5.6)

## 2019-07-16 NOTE — Patient Instructions (Signed)
Continue the changes that you have made:             Decreased oil             Decreased snacks  Increasing your vegetables Eat a snack only when hungry- unsalted peanuts is fine Drink adequate water Consider choosing a fruit and vegetable with each meal. Consider walking for 30 minutes after lunch. Continue to check your blood sugar Continue to take your medication as prescribed

## 2019-07-16 NOTE — Patient Instructions (Signed)
-   Restart Toujeo at 20 units daily - If your pre-meal sugar is less then 200, take 5 units of  Novolog, but if your pre-meal sugar is over 200 take 7 units of Novolog with that meal    HOW TO TREAT LOW BLOOD SUGARS (Blood sugar LESS THAN 70 MG/DL)  Please follow the RULE OF 15 for the treatment of hypoglycemia treatment (when your (blood sugars are less than 70 mg/dL)    STEP 1: Take 15 grams of carbohydrates when your blood sugar is low, which includes:   3-4 GLUCOSE TABS  OR  3-4 OZ OF JUICE OR REGULAR SODA OR  ONE TUBE OF GLUCOSE GEL     STEP 2: RECHECK blood sugar in 15 MINUTES STEP 3: If your blood sugar is still low at the 15 minute recheck --> then, go back to STEP 1 and treat AGAIN with another 15 grams of carbohydrates.

## 2019-07-19 ENCOUNTER — Encounter: Payer: Self-pay | Admitting: Dietician

## 2019-07-19 NOTE — Progress Notes (Signed)
Diabetes Self-Management Education  Visit Type:  Follow-up  Appt. Start Time: 0900 Appt. End Time: 0930  07/19/2019  [name], identified by name and date of birth, is a 69 y.o. male with a diagnosis of Diabetes:  Type 2  ASSESSMENT History includes HTN, CKD3, HTN, hypertrophic cardiomyopathy, and type 2 diabetes (since 2014 and insulin since diagnosis). Medications include Toujeo 26 units per day and Novolog 5 units tid before meals.  Patient lives alone. 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 Originally from [place] ([place]). A1C 5.9% 07/16/19 Weight 191 lbs increased from 187 lbs in the past 4 lbs in the past 4 months. He has not been walking as much due to the weather but does home weights for 15-20 minutes daily.   Diabetes Self-Management Education - 07/19/19 0924      Psychosocial Assessment   Patient Belief/Attitude about Diabetes  Motivated to manage diabetes    Self-care barriers  English as a second language    Patient Concerns  Nutrition/Meal planning    Special Needs  None    Learning Readiness  Ready      Pre-Education Assessment   Patient understands the diabetes disease and treatment process.  Demonstrates understanding / competency    Patient understands incorporating nutritional management into lifestyle.  Needs Review    Patient undertands incorporating physical activity into lifestyle.  Needs Review    Patient understands using medications safely.  Needs Review    Patient understands monitoring blood glucose, interpreting and using results  Demonstrates understanding / competency    Patient understands prevention, detection, and treatment of acute complications.  Demonstrates understanding / competency    Patient understands prevention, detection, and treatment of chronic complications.  Demonstrates understanding / competency    Patient understands how to develop strategies to address psychosocial  issues.  Needs Review    Patient understands how to develop strategies to promote health/change behavior.  Needs Review      Complications   Last HgB A1C per patient/outside source  5.9 %   07/16/2019   How often do you check your blood sugar?  1-2 times/day      Dietary Intake   Breakfast  2-3 waffles with butter OR cereal "Honey Nut Cheerios" OR occasional eggs    Lunch  sweet potato, occasional beans, woup    Snack (afternoon)  peanuts    Dinner  skinless chicken, vegetables    Snack (evening)  occasional cookies    Beverage(s)  water, unsweetened tea, hot tea with splenda      Exercise   Exercise Type  Light (walking / raking leaves)    How many days per week to you exercise?  7    How many minutes per day do you exercise?  30    Total minutes per week of exercise  210      Patient Education   Previous Diabetes Education  Yes (please comment)   02/2019   Nutrition management   Role of diet in the treatment of diabetes and the relationship between the three main macronutrients and blood glucose level;Food label reading, portion sizes and measuring food.;Meal options for control of blood glucose level and chronic complications.    Medications  Reviewed patients medication for diabetes, action, purpose, timing of dose and side effects.      Individualized Goals (developed by patient)   Nutrition  General guidelines for healthy choices and portions discussed  Physical Activity  Exercise 5-7 days per week;30 minutes per day    Medications  take my medication as prescribed    Monitoring   test my blood glucose as discussed    Health Coping  discuss diabetes with (comment)   MD, RD, CDE     Patient Self-Evaluation of Goals - Patient rates self as meeting previously set goals (% of time)   Nutrition  >75%    Physical Activity  >75%    Medications  >75%    Monitoring  >75%    Problem Solving  >75%    Reducing Risk  >75%    Health Coping  >75%      Post-Education Assessment    Patient understands the diabetes disease and treatment process.  Demonstrates understanding / competency    Patient understands incorporating nutritional management into lifestyle.  Needs Review    Patient undertands incorporating physical activity into lifestyle.  Demonstrates understanding / competency    Patient understands using medications safely.  Demonstrates understanding / competency    Patient understands monitoring blood glucose, interpreting and using results  Demonstrates understanding / competency    Patient understands prevention, detection, and treatment of acute complications.  Demonstrates understanding / competency    Patient understands prevention, detection, and treatment of chronic complications.  Demonstrates understanding / competency    Patient understands how to develop strategies to address psychosocial issues.  Demonstrates understanding / competency    Patient understands how to develop strategies to promote health/change behavior.  Needs Review      Outcomes   Program Status  Completed      Subsequent Visit   Since your last visit have you experienced any weight changes?  Gain    Weight Gain (lbs)  4       Learning Objective:  Patient will have a greater understanding of diabetes self-management. Patient education plan is to attend individual and/or group sessions per assessed needs and concerns.   Plan:   Patient Instructions  Continue the changes that you have made:             Decreased oil             Decreased snacks  Increasing your vegetables Eat a snack only when hungry- unsalted peanuts is fine Drink adequate water Consider choosing a fruit and vegetable with each meal. Consider walking for 30 minutes after lunch. Continue to check your blood sugar Continue to take your medication as prescribed     Expected Outcomes:  Demonstrated interest in learning. Expect positive outcomes  Education material provided:   If problems or questions,  patient to contact team via:  Phone and Email  Future DSME appointment: - 3-4 months

## 2019-08-04 ENCOUNTER — Encounter: Payer: Self-pay | Admitting: Cardiology

## 2019-08-10 ENCOUNTER — Ambulatory Visit: Payer: PPO | Admitting: Cardiology

## 2019-08-18 DIAGNOSIS — G4733 Obstructive sleep apnea (adult) (pediatric): Secondary | ICD-10-CM | POA: Diagnosis not present

## 2019-08-30 ENCOUNTER — Other Ambulatory Visit: Payer: Self-pay | Admitting: Internal Medicine

## 2019-08-30 NOTE — Progress Notes (Signed)
Cardiology Office Note    Date:  09/01/2019   ID:  Robert Chaney, DOB 1950-12-30, MRN [id number]  PCP:  [name], MD  Cardiologist: Ena Dawley, MD EPS: None  No chief complaint on file.   History of Present Illness:  Robert Chaney is a 69 y.o. male with history of hypertrophic cardiomyopathy septal thickness 21 mm without significant gradient and no SAM.  MRI 10/2016 with the presence of LGE and 8 myocardial segments upper septal thickness 21 mm.  Patient completely asymptomatic and no history of syncope.  Also has essential hypertension, hyperlipidemia, chronic diastolic CHF, CKD stage IV, OSA, DM type 2  Patient last saw Dr. Meda Coffee 02/2018 and was doing well without symptoms. Saw Pecolia Ades, NP 06/09/19 and had OSA and was being fitted for CPAP  Patient comes in for f/u. Ran out of insulin and didn't feel well yesterday. Not sleeping well with CPAP. BP running 136/60's at home but also sometimes high as 150/80's. Trying to watch salt. Doesn't get any exercise. Says he felt  bad on higher dose hydralazine and almost passed out. Denies chest pain, palpitations, dyspnea, dyspnea on exertion, dizziness or presyncope.     Past Medical History:  Diagnosis Date  . Altered mental status    a. 05/2017 - adm with blurred vision, somnolence in setting of AKI and high blood sugar.  . Anemia   . CKD (chronic kidney disease), stage III   . Diabetes mellitus   . Diastolic CHF, chronic (HCC)    A.  03/2009 Echo: EF 60-65%, Gr II diast dysfxn  . Elevated troponin    a. 2013 - troponin 1.4. b. 2019 - troponin 0.32; neg nuc 07/2017.  . Glaucoma   . Hyperlipidemia    HYPERCHOLESTEROLEMIA  . Hypertension    MARKED LEFT VENTRICULAR HYPERTROPHY BY PREVIOUS ECHOCARDIOGRAM--HE HAS HYPERDYNAMIC LEFT VENTRICULAR SYSTOLIC FUNCTION AND HAS IMPAIRED RELAXATION BY ECHO  . Hypertrophic cardiomyopathy ([place])   . NSVT (nonsustained ventricular tachycardia) ([place])   . Premature atrial contractions     . PVC's (premature ventricular contractions)   . SVT (supraventricular tachycardia) (HCC)     Past Surgical History:  Procedure Laterality Date  . NO PAST SURGERIES      Current Medications: Current Meds  Medication Sig  . amLODipine (NORVASC) 10 MG tablet Take 1 tablet (10 mg total) by mouth at bedtime.  Marland Kitchen atorvastatin (LIPITOR) 20 MG tablet Take 1 tablet (20 mg total) by mouth daily.  . carvedilol (COREG) 25 MG tablet Take 1 tablet (25 mg total) by mouth 2 (two) times daily.  . cholecalciferol (VITAMIN D3) 25 MCG (1000 UT) tablet Take 1,000 Units by mouth daily.  . furosemide (LASIX) 20 MG tablet Take 1 tablet (20 mg total) by mouth daily.  Marland Kitchen glucose blood (ONETOUCH VERIO) test strip Use as instructed to test blood sugar 3 times daily E11.65  . hydrALAZINE (APRESOLINE) 50 MG tablet Take 1.5 tablets (75 mg total) by mouth 3 (three) times daily.  . insulin aspart (NOVOLOG FLEXPEN) 100 UNIT/ML FlexPen Inject 5 Units into the skin 3 (three) times daily with meals. Max daily 21 units (Patient taking differently: Inject 5-7 Units into the skin 3 (three) times daily with meals. Max daily 21 units)  . Insulin Glargine (TOUJEO MAX SOLOSTAR) 300 UNIT/ML SOPN Inject 20 Units into the skin at bedtime.  . Insulin Pen Needle (B-D UF III MINI PEN NEEDLES) 31G X 5 MM MISC Four times daily  . isosorbide mononitrate (IMDUR)  60 MG 24 hr tablet Take 1 tablet (60 mg total) by mouth daily.  Marland Kitchen losartan-hydrochlorothiazide (HYZAAR) 50-12.5 MG tablet Take 1 tablet by mouth every morning.  . nitroGLYCERIN (NITROSTAT) 0.4 MG SL tablet Place 1 tablet (0.4 mg total) under the tongue every 5 (five) minutes as needed for chest pain.     Allergies:   Aspirin   Social History   Socioeconomic History  . Marital status: Legally Separated    Spouse name: Not on file  . Number of children: Not on file  . Years of education: Not on file  . Highest education level: Not on file  Occupational History  . Occupation:  Forensic psychologist: [employer]    Comment: At [employer]  . Smoking status: Never Smoker  . Smokeless tobacco: Never Used  Substance and Sexual Activity  . Alcohol use: No  . Drug use: No  . Sexual activity: Never  Other Topics Concern  . Not on file  Social History Narrative   Originally from [place].    Social Determinants of Health   Financial Resource Strain:   . Difficulty of Paying Living Expenses: Not on file  Food Insecurity:   . Worried About Charity fundraiser in the Last Year: Not on file  . Ran Out of Food in the Last Year: Not on file  Transportation Needs:   . Lack of Transportation (Medical): Not on file  . Lack of Transportation (Non-Medical): Not on file  Physical Activity:   . Days of Exercise per Week: Not on file  . Minutes of Exercise per Session: Not on file  Stress:   . Feeling of Stress : Not on file  Social Connections:   . Frequency of Communication with Friends and Family: Not on file  . Frequency of Social Gatherings with Friends and Family: Not on file  . Attends Religious Services: Not on file  . Active Member of Clubs or Organizations: Not on file  . Attends Archivist Meetings: Not on file  . Marital Status: Not on file     Family History:  The patient's family history includes Diabetes in his brother and brother; Hypertension in his father.   ROS:   Please see the history of present illness.    ROS All other systems reviewed and are negative.   PHYSICAL EXAM:   VS:  BP (!) 160/84   Pulse 68   Ht 5\' 8"  (1.727 m)   Wt 190 lb 12.8 oz (86.5 kg)   SpO2 99%   BMI 29.01 kg/m   Physical Exam  GEN: Well nourished, well developed, in no acute distress  Neck: no JVD, carotid bruits, or masses Cardiac:RRR; no murmurs, rubs, or gallops  Respiratory:  clear to auscultation bilaterally, normal work of breathing GI: soft, nontender, nondistended, + BS Ext: without cyanosis, clubbing, or edema, Good  distal pulses bilaterally Neuro:  Alert and Oriented x 3 Psych: euthymic mood, full affect  Wt Readings from Last 3 Encounters:  09/01/19 190 lb 12.8 oz (86.5 kg)  07/19/19 191 lb (86.6 kg)  07/16/19 191 lb 12.8 oz (87 kg)      Studies/Labs Reviewed:   EKG:  EKG is not ordered today.   Recent Labs: 02/16/2019: BUN 31; Creatinine, Ser 2.04; Magnesium 2.0; Potassium 4.1; Sodium 141   Lipid Panel    Component Value Date/Time   CHOL 157 11/07/2016 0922   TRIG 81 11/07/2016 0922   HDL  59 11/07/2016 0922   CHOLHDL 2.7 11/07/2016 0922   CHOLHDL 3 05/04/2014 0852   VLDL 13.6 05/04/2014 0852   LDLCALC 82 11/07/2016 0922    Additional studies/ records that were reviewed today include:   Echo 01/2019 IMPRESSIONS     1. The left ventricle has normal systolic function with an ejection  fraction of 60-65%. The cavity size was normal. There is severe increase  LV wall thickness. Findings are consistent with hypertrophic  cardiomyopathy. Peak LVOT velocity at rest was  11mmHg that increased to 22mmHg with Valsalva. Left ventricular diastolic  Doppler parameters are consistent with pseudonormalization. Elevated left  ventricular end-diastolic pressure No evidence of left ventricular  regional wall motion abnormalities.   2. The right ventricle has normal systolic function. The cavity was  normal. There is no increase in right ventricular wall thickness.   3. Left atrial size was mildly dilated.   4. The pericardial effusion is circumferential.   5. Trivial pericardial effusion is present.   6. The aortic valve is tricuspid. Mild sclerosis of the aortic valve.   7. There is mild dilatation of the aortic root measuring 39 mm.   8. Findings consistent with HOCM. There is a dynamic LVOT gradient of  6mmHg at rest that increased to 38mmHg with Valsalva. No leaflet or  chordal SAM.    ASSESSMENT:    1. Hypertrophic cardiomyopathy ([place])   2. Essential hypertension   3. Chronic  diastolic CHF (congestive heart failure) ([place])   4. Hyperlipidemia, unspecified hyperlipidemia type   5. DM (diabetes mellitus) with complications (HCC)   6. Stage 3 chronic kidney disease, unspecified whether stage 3a or 3b CKD   7. PVC (premature ventricular contraction)      PLAN:  In order of problems listed above:   Hypertrophic cardiomyopathy septal thickness 21 mm on MRI 10/2016  Essential hypertension-BP up today but has been better at home. He became dizzy on higher doses of hydralazine. He wants to try to increase exercise and watch his salt closely before adding another medicine. F/u in 2 weeks.  Chronic diastolic CHF echo 06/6107 normal LVEF 60-65% HOCM peak LVOT rest 4mmg increased 101mmHg with valsalva. Diastolic dysfunction-mild edema on exam and started on lasix, check renal today.  Dyslipidemia on lipitor  DM type II followed by endocrine-ran out of insulin yesterday but contacted his Dr. And will pick it up today.  CKD stage III last creatinine 2.04-01/2019  PVC's controlled with carvedilol-holter 04/2019 low PVC load   Medication Adjustments/Labs and Tests Ordered: Current medicines are reviewed at length with the patient today.  Concerns regarding medicines are outlined above.  Medication changes, Labs and Tests ordered today are listed in the Patient Instructions below. Patient Instructions   Medication Instructions:  Your physician recommends that you continue on your current medications as directed. Please refer to the Current Medication list given to you today.  *If you need a refill on your cardiac medications before your next appointment, please call your pharmacy*   Lab Work: TODAY: BMET  If you have labs (blood work) drawn today and your tests are completely normal, you will receive your results only by: Marland Kitchen MyChart Message (if you have MyChart) OR . A paper copy in the mail If you have any lab test that is abnormal or we need to change your  treatment, we will call you to review the results.   Testing/Procedures: None ordered   Follow-Up:  Follow up with Ermalinda Barrios, PA on 09/14/19  at 11:15 AM    Other Instructions  Your provider recommends that you maintain 150 minutes per week of moderate aerobic activity.  Two Gram Sodium Diet 2000 mg  What is Sodium? Sodium is a mineral found naturally in many foods. The most significant source of sodium in the diet is table salt, which is about 40% sodium.  Processed, convenience, and preserved foods also contain a large amount of sodium.  The body needs only 500 mg of sodium daily to function,  A normal diet provides more than enough sodium even if you do not use salt.  Why Limit Sodium? A build up of sodium in the body can cause thirst, increased blood pressure, shortness of breath, and water retention.  Decreasing sodium in the diet can reduce edema and risk of heart attack or stroke associated with high blood pressure.  Keep in mind that there are many other factors involved in these health problems.  Heredity, obesity, lack of exercise, cigarette smoking, stress and what you eat all play a role.  General Guidelines:  Do not add salt at the table or in cooking.  One teaspoon of salt contains over 2 grams of sodium.  Read food labels  Avoid processed and convenience foods  Ask your dietitian before eating any foods not dicussed in the menu planning guidelines  Consult your physician if you wish to use a salt substitute or a sodium containing medication such as antacids.  Limit milk and milk products to 16 oz (2 cups) per day.  Shopping Hints:  READ LABELS!! "Dietetic" does not necessarily mean low sodium.  Salt and other sodium ingredients are often added to foods during processing.   Menu Planning Guidelines Food Group Choose More Often Avoid  Beverages (see also the milk group All fruit juices, low-sodium, salt-free vegetables juices, low-sodium carbonated beverages  Regular vegetable or tomato juices, commercially softened water used for drinking or cooking  Breads and Cereals Enriched white, wheat, rye and pumpernickel bread, hard rolls and dinner rolls; muffins, cornbread and waffles; most dry cereals, cooked cereal without added salt; unsalted crackers and breadsticks; low sodium or homemade bread crumbs Bread, rolls and crackers with salted tops; quick breads; instant hot cereals; pancakes; commercial bread stuffing; self-rising flower and biscuit mixes; regular bread crumbs or cracker crumbs  Desserts and Sweets Desserts and sweets mad with mild should be within allowance Instant pudding mixes and cake mixes  Fats Butter or margarine; vegetable oils; unsalted salad dressings, regular salad dressings limited to 1 Tbs; light, sour and heavy cream Regular salad dressings containing bacon fat, bacon bits, and salt pork; snack dips made with instant soup mixes or processed cheese; salted nuts  Fruits Most fresh, frozen and canned fruits Fruits processed with salt or sodium-containing ingredient (some dried fruits are processed with sodium sulfites        Vegetables Fresh, frozen vegetables and low- sodium canned vegetables Regular canned vegetables, sauerkraut, pickled vegetables, and others prepared in brine; frozen vegetables in sauces; vegetables seasoned with ham, bacon or salt pork  Condiments, Sauces, Miscellaneous  Salt substitute with physician's approval; pepper, herbs, spices; vinegar, lemon or lime juice; hot pepper sauce; garlic powder, onion powder, low sodium soy sauce (1 Tbs.); low sodium condiments (ketchup, chili sauce, mustard) in limited amounts (1 tsp.) fresh ground horseradish; unsalted tortilla chips, pretzels, potato chips, popcorn, salsa (1/4 cup) Any seasoning made with salt including garlic salt, celery salt, onion salt, and seasoned salt; sea salt, rock salt, kosher salt; meat tenderizers;  monosodium glutamate; mustard, regular soy sauce,  barbecue, sauce, chili sauce, teriyaki sauce, steak sauce, Worcestershire sauce, and most flavored vinegars; canned gravy and mixes; regular condiments; salted snack foods, olives, picles, relish, horseradish sauce, catsup   Food preparation: Try these seasonings Meats:    Pork Sage, onion Serve with applesauce  Chicken Poultry seasoning, thyme, parsley Serve with cranberry sauce  Lamb Curry powder, rosemary, garlic, thyme Serve with mint sauce or jelly  Veal Marjoram, basil Serve with current jelly, cranberry sauce  Beef Pepper, bay leaf Serve with dry mustard, unsalted chive butter  Fish [place] leaf, dill Serve with unsalted lemon butter, unsalted parsley butter  Vegetables:    Asparagus Lemon juice   Broccoli Lemon juice   Carrots Mustard dressing parsley, mint, nutmeg, glazed with unsalted butter and sugar   Green beans Marjoram, lemon juice, nutmeg,dill seed   Tomatoes Basil, marjoram, onion   Spice /blend for [employer]" 4 tsp ground thyme 1 tsp ground sage 3 tsp ground rosemary 4 tsp ground marjoram   Test your knowledge 1. A product that says "Salt Free" may still contain sodium. True or False 2. Garlic Powder and Hot Pepper Sauce an be used as alternative seasonings.True or False 3. Processed foods have more sodium than fresh foods.  True or False 4. Canned Vegetables have less sodium than froze True or False  WAYS TO DECREASE YOUR SODIUM INTAKE 1. Avoid the use of added salt in cooking and at the table.  Table salt (and other prepared seasonings which contain salt) is probably one of the greatest sources of sodium in the diet.  Unsalted foods can gain flavor from the sweet, sour, and butter taste sensations of herbs and spices.  Instead of using salt for seasoning, try the following seasonings with the foods listed.  Remember: how you use them to enhance natural food flavors is limited only by your creativity... Allspice-Meat, fish, eggs, fruit, peas, red and yellow  vegetables Almond Extract-Fruit baked goods Anise Seed-Sweet breads, fruit, carrots, beets, cottage cheese, cookies (tastes like licorice) Basil-Meat, fish, eggs, vegetables, rice, vegetables salads, soups, sauces Bay Leaf-Meat, fish, stews, poultry Burnet-Salad, vegetables (cucumber-like flavor) Caraway Seed-Bread, cookies, cottage cheese, meat, vegetables, cheese, rice Cardamon-Baked goods, fruit, soups Celery Powder or seed-Salads, salad dressings, sauces, meatloaf, soup, bread.Do not use  celery salt Chervil-Meats, salads, fish, eggs, vegetables, cottage cheese (parsley-like flavor) Chili Power-Meatloaf, chicken cheese, corn, eggplant, egg dishes Chives-Salads cottage cheese, egg dishes, soups, vegetables, sauces Cilantro-Salsa, casseroles Cinnamon-Baked goods, fruit, pork, lamb, chicken, carrots Cloves-Fruit, baked goods, fish, pot roast, green beans, beets, carrots Coriander-Pastry, cookies, meat, salads, cheese (lemon-orange flavor) Cumin-Meatloaf, fish,cheese, eggs, cabbage,fruit pie (caraway flavor) [employer], fruit, eggs, fish, poultry, cottage cheese, vegetables Dill Seed-Meat, cottage cheese, poultry, vegetables, fish, salads, bread Fennel Seed-Bread, cookies, apples, pork, eggs, fish, beets, cabbage, cheese, Licorice-like flavor Garlic-(buds or powder) Salads, meat, poultry, fish, bread, butter, vegetables, potatoes.Do not  use garlic salt Ginger-Fruit, vegetables, baked goods, meat, fish, poultry Horseradish Root-Meet, vegetables, butter Lemon Juice or Extract-Vegetables, fruit, tea, baked goods, fish salads Mace-Baked goods fruit, vegetables, fish, poultry (taste like nutmeg) Maple Extract-Syrups Marjoram-Meat, chicken, fish, vegetables, breads, green salads (taste like Sage) Mint-Tea, lamb, sherbet, vegetables, desserts, carrots, cabbage Mustard, Dry or Seed-Cheese, eggs, meats, vegetables, poultry Nutmeg-Baked goods, fruit, chicken, eggs, vegetables,  desserts Onion Powder-Meat, fish, poultry, vegetables, cheese, eggs, bread, rice salads (Do not use   Onion salt) Orange Extract-Desserts, baked goods Oregano-Pasta, eggs, cheese, onions, pork, lamb, fish, chicken, vegetables, green salads Paprika-Meat,  fish, poultry, eggs, cheese, vegetables Parsley Flakes-Butter, vegetables, meat fish, poultry, eggs, bread, salads (certain forms may   Contain sodium Pepper-Meat fish, poultry, vegetables, eggs Peppermint Extract-Desserts, baked goods Poppy Seed-Eggs, bread, cheese, fruit dressings, baked goods, noodles, vegetables, cottage  [employer], poultry, meat, fish, cauliflower, turnips,eggs bread Saffron-Rice, bread, veal, chicken, fish, eggs Sage-Meat, fish, poultry, onions, eggplant, tomateos, pork, stews Savory-Eggs, salads, poultry, meat, rice, vegetables, soups, pork Tarragon-Meat, poultry, fish, eggs, butter, vegetables (licorice-like flavor)  Thyme-Meat, poultry, fish, eggs, vegetables, (clover-like flavor), sauces, soups Tumeric-Salads, butter, eggs, fish, rice, vegetables (saffron-like flavor) Vanilla Extract-Baked goods, candy Vinegar-Salads, vegetables, meat marinades Walnut Extract-baked goods, candy  2. Choose your Foods Wisely   The following is a list of foods to avoid which are high in sodium:  Meats-Avoid all smoked, canned, salt cured, dried and kosher meat and fish as well as Anchovies   [employer] meats:Bologna, Liverwurst, Pastrami Canned meat or fish  Marinated herring Caviar    Pepperoni Corned Beef   Pizza Dried chipped beef  Salami Frozen breaded fish or meat Salt pork Frankfurters or hot dogs  Sardines Gefilte fish   Sausage Ham (boiled ham, Proscuitto Smoked butt    spiced ham)   Spam      TV Dinners Vegetables Canned vegetables (Regular) Relish Canned mushrooms  Sauerkraut Olives    Tomato juice Pickles  Bakery and Dessert Products Canned  puddings  Cream pies Cheesecake   Decorated cakes Cookies  Beverages/Juices Tomato juice, regular  Gatorade   V-8 vegetable juice, regular  Breads and Cereals Biscuit mixes   Salted potato chips, corn chips, pretzels Bread stuffing mixes  Salted crackers and rolls Pancake and waffle mixes Self-rising flour  Seasonings Accent    Meat sauces Barbecue sauce  Meat tenderizer Catsup    Monosodium glutamate (MSG) Celery salt   Onion salt Chili sauce   Prepared mustard Garlic salt   Salt, seasoned salt, sea salt Gravy mixes   Soy sauce Horseradish   Steak sauce Ketchup   Tartar sauce Lite salt    Teriyaki sauce Marinade mixes   Worcestershire sauce  Others Baking powder   Cocoa and cocoa mixes Baking soda   Commercial casserole mixes Candy-caramels, chocolate  Dehydrated soups    Bars, fudge,nougats  Instant rice and pasta mixes Canned broth or soup  Maraschino cherries Cheese, aged and processed cheese and cheese spreads  Learning Assessment Quiz  Indicated T (for True) or F (for False) for each of the following statements:  1. _____ Fresh fruits and vegetables and unprocessed grains are generally low in sodium 2. _____ Water may contain a considerable amount of sodium, depending on the source 3. _____ You can always tell if a food is high in sodium by tasting it 4. _____ Certain laxatives my be high in sodium and should be avoided unless prescribed   by a physician or pharmacist 5. _____ Salt substitutes may be used freely by anyone on a sodium restricted diet 6. _____ Sodium is present in table salt, food additives and as a natural component of   most foods 7. _____ Table salt is approximately 90% sodium 8. _____ Limiting sodium intake may help prevent excess fluid accumulation in the body 9. _____ On a sodium-restricted diet, seasonings such as bouillon soy sauce, and    cooking wine should be used in place of table salt 10. _____ On an ingredient list, a product which  lists monosodium glutamate as the first  ingredient is an appropriate food to include on a low sodium diet  Circle the best answer(s) to the following statements (Hint: there may be more than one correct answer)  11. On a low-sodium diet, some acceptable snack items are:    A. Olives  F. Bean dip   K. Grapefruit juice    B. Salted Pretzels G. Commercial Popcorn   L. Canned peaches    C. Carrot Sticks  H. Bouillon   M. Unsalted nuts   D. [place] fries  I. Peanut butter crackers N. Salami   E. Sweet pickles J. Tomato Juice   O. Pizza  12.  Seasonings that may be used freely on a reduced - sodium diet include   A. Lemon wedges F.Monosodium glutamate K. Celery seed    B.Soysauce   G. Pepper   L. Mustard powder   C. Sea salt  H. Cooking wine  M. Onion flakes   D. Vinegar  E. Prepared horseradish N. Salsa   E. Sage   J. Worcestershire sauce  O. [address]     Sumner Boast, PA-C  09/01/2019 8:53 AM    [facility] [address], [place], [place]  [address] Phone: [phone]; Fax: [phone]

## 2019-08-31 ENCOUNTER — Other Ambulatory Visit: Payer: Self-pay | Admitting: Cardiology

## 2019-08-31 NOTE — Telephone Encounter (Signed)
I called his Pharmacy and ask them to fwd this refill to his PCP.

## 2019-08-31 NOTE — Telephone Encounter (Signed)
*  STAT* If patient is at the pharmacy, call can be transferred to refill team.   1. Which medications need to be refilled? (please list name of each medication and dose if known)   insulin aspart (NOVOLOG FLEXPEN) 100 UNIT/ML FlexPen  Patient states he only needs the vial.  2. Which pharmacy/location (including street and city if local pharmacy) is medication to be sent to? No Name, [address]  3. Do they need a 30 day or 90 day supply?   Patient states he is completely out of medication.

## 2019-09-01 ENCOUNTER — Other Ambulatory Visit: Payer: Self-pay

## 2019-09-01 ENCOUNTER — Encounter: Payer: Self-pay | Admitting: Physician Assistant

## 2019-09-01 ENCOUNTER — Ambulatory Visit: Payer: PPO | Admitting: Physician Assistant

## 2019-09-01 VITALS — BP 160/84 | HR 68 | Ht 68.0 in | Wt 190.8 lb

## 2019-09-01 DIAGNOSIS — I10 Essential (primary) hypertension: Secondary | ICD-10-CM

## 2019-09-01 DIAGNOSIS — I493 Ventricular premature depolarization: Secondary | ICD-10-CM

## 2019-09-01 DIAGNOSIS — I422 Other hypertrophic cardiomyopathy: Secondary | ICD-10-CM | POA: Diagnosis not present

## 2019-09-01 DIAGNOSIS — N183 Chronic kidney disease, stage 3 unspecified: Secondary | ICD-10-CM | POA: Diagnosis not present

## 2019-09-01 DIAGNOSIS — E118 Type 2 diabetes mellitus with unspecified complications: Secondary | ICD-10-CM

## 2019-09-01 DIAGNOSIS — E785 Hyperlipidemia, unspecified: Secondary | ICD-10-CM | POA: Diagnosis not present

## 2019-09-01 DIAGNOSIS — I5032 Chronic diastolic (congestive) heart failure: Secondary | ICD-10-CM

## 2019-09-01 LAB — BASIC METABOLIC PANEL
BUN/Creatinine Ratio: 12 (ref 10–24)
BUN: 28 mg/dL — ABNORMAL HIGH (ref 8–27)
CO2: 26 mmol/L (ref 20–29)
Calcium: 8.9 mg/dL (ref 8.6–10.2)
Chloride: 104 mmol/L (ref 96–106)
Creatinine, Ser: 2.37 mg/dL — ABNORMAL HIGH (ref 0.76–1.27)
GFR calc Af Amer: 31 mL/min/{1.73_m2} — ABNORMAL LOW (ref >59)
GFR calc non Af Amer: 27 mL/min/{1.73_m2} — ABNORMAL LOW (ref >59)
Glucose: 121 mg/dL — ABNORMAL HIGH (ref 65–99)
Potassium: 4.5 mmol/L (ref 3.5–5.2)
Sodium: 140 mmol/L (ref 134–144)

## 2019-09-01 NOTE — Patient Instructions (Addendum)
Medication Instructions:  Your physician recommends that you continue on your current medications as directed. Please refer to the Current Medication list given to you today.  *If you need a refill on your cardiac medications before your next appointment, please call your pharmacy*   Lab Work: TODAY: BMET  If you have labs (blood work) drawn today and your tests are completely normal, you will receive your results only by: Marland Kitchen MyChart Message (if you have MyChart) OR . A paper copy in the mail If you have any lab test that is abnormal or we need to change your treatment, we will call you to review the results.   Testing/Procedures: None ordered   Follow-Up:  Follow up with Robert Barrios, PA on 09/14/19 at 11:15 AM    Other Instructions  Your provider recommends that you maintain 150 minutes per week of moderate aerobic activity.  Two Gram Sodium Diet 2000 mg  What is Sodium? Sodium is a mineral found naturally in many foods. The most significant source of sodium in the diet is table salt, which is about 40% sodium.  Processed, convenience, and preserved foods also contain a large amount of sodium.  The body needs only 500 mg of sodium daily to function,  A normal diet provides more than enough sodium even if you do not use salt.  Why Limit Sodium? A build up of sodium in the body can cause thirst, increased blood pressure, shortness of breath, and water retention.  Decreasing sodium in the diet can reduce edema and risk of heart attack or stroke associated with high blood pressure.  Keep in mind that there are many other factors involved in these health problems.  Heredity, obesity, lack of exercise, cigarette smoking, stress and what you eat all play a role.  General Guidelines:  Do not add salt at the table or in cooking.  One teaspoon of salt contains over 2 grams of sodium.  Read food labels  Avoid processed and convenience foods  Ask your dietitian before eating any foods  not dicussed in the menu planning guidelines  Consult your physician if you wish to use a salt substitute or a sodium containing medication such as antacids.  Limit milk and milk products to 16 oz (2 cups) per day.  Shopping Hints:  READ LABELS!! "Dietetic" does not necessarily mean low sodium.  Salt and other sodium ingredients are often added to foods during processing.   Menu Planning Guidelines Food Group Choose More Often Avoid  Beverages (see also the milk group All fruit juices, low-sodium, salt-free vegetables juices, low-sodium carbonated beverages Regular vegetable or tomato juices, commercially softened water used for drinking or cooking  Breads and Cereals Enriched white, wheat, rye and pumpernickel bread, hard rolls and dinner rolls; muffins, cornbread and waffles; most dry cereals, cooked cereal without added salt; unsalted crackers and breadsticks; low sodium or homemade bread crumbs Bread, rolls and crackers with salted tops; quick breads; instant hot cereals; pancakes; commercial bread stuffing; self-rising flower and biscuit mixes; regular bread crumbs or cracker crumbs  Desserts and Sweets Desserts and sweets mad with mild should be within allowance Instant pudding mixes and cake mixes  Fats Butter or margarine; vegetable oils; unsalted salad dressings, regular salad dressings limited to 1 Tbs; light, sour and heavy cream Regular salad dressings containing bacon fat, bacon bits, and salt pork; snack dips made with instant soup mixes or processed cheese; salted nuts  Fruits Most fresh, frozen and canned fruits Fruits processed with salt or  sodium-containing ingredient (some dried fruits are processed with sodium sulfites        Vegetables Fresh, frozen vegetables and low- sodium canned vegetables Regular canned vegetables, sauerkraut, pickled vegetables, and others prepared in brine; frozen vegetables in sauces; vegetables seasoned with ham, bacon or salt pork  Condiments,  Sauces, Miscellaneous  Salt substitute with physician's approval; pepper, herbs, spices; vinegar, lemon or lime juice; hot pepper sauce; garlic powder, onion powder, low sodium soy sauce (1 Tbs.); low sodium condiments (ketchup, chili sauce, mustard) in limited amounts (1 tsp.) fresh ground horseradish; unsalted tortilla chips, pretzels, potato chips, popcorn, salsa (1/4 cup) Any seasoning made with salt including garlic salt, celery salt, onion salt, and seasoned salt; sea salt, rock salt, kosher salt; meat tenderizers; monosodium glutamate; mustard, regular soy sauce, barbecue, sauce, chili sauce, teriyaki sauce, steak sauce, Worcestershire sauce, and most flavored vinegars; canned gravy and mixes; regular condiments; salted snack foods, olives, picles, relish, horseradish sauce, catsup   Food preparation: Try these seasonings Meats:    Pork Sage, onion Serve with applesauce  Chicken Poultry seasoning, thyme, parsley Serve with cranberry sauce  Lamb Curry powder, rosemary, garlic, thyme Serve with mint sauce or jelly  Veal Marjoram, basil Serve with current jelly, cranberry sauce  Beef Pepper, bay leaf Serve with dry mustard, unsalted chive butter  Fish [place] leaf, dill Serve with unsalted lemon butter, unsalted parsley butter  Vegetables:    Asparagus Lemon juice   Broccoli Lemon juice   Carrots Mustard dressing parsley, mint, nutmeg, glazed with unsalted butter and sugar   Green beans Marjoram, lemon juice, nutmeg,dill seed   Tomatoes Basil, marjoram, onion   Spice /blend for [employer]" 4 tsp ground thyme 1 tsp ground sage 3 tsp ground rosemary 4 tsp ground marjoram   Test your knowledge 1. A product that says "Salt Free" may still contain sodium. True or False 2. Garlic Powder and Hot Pepper Sauce an be used as alternative seasonings.True or False 3. Processed foods have more sodium than fresh foods.  True or False 4. Canned Vegetables have less sodium than froze True or False  WAYS  TO DECREASE YOUR SODIUM INTAKE 1. Avoid the use of added salt in cooking and at the table.  Table salt (and other prepared seasonings which contain salt) is probably one of the greatest sources of sodium in the diet.  Unsalted foods can gain flavor from the sweet, sour, and butter taste sensations of herbs and spices.  Instead of using salt for seasoning, try the following seasonings with the foods listed.  Remember: how you use them to enhance natural food flavors is limited only by your creativity... Allspice-Meat, fish, eggs, fruit, peas, red and yellow vegetables Almond Extract-Fruit baked goods Anise Seed-Sweet breads, fruit, carrots, beets, cottage cheese, cookies (tastes like licorice) Basil-Meat, fish, eggs, vegetables, rice, vegetables salads, soups, sauces Bay Leaf-Meat, fish, stews, poultry Burnet-Salad, vegetables (cucumber-like flavor) Caraway Seed-Bread, cookies, cottage cheese, meat, vegetables, cheese, rice Cardamon-Baked goods, fruit, soups Celery Powder or seed-Salads, salad dressings, sauces, meatloaf, soup, bread.Do not use  celery salt Chervil-Meats, salads, fish, eggs, vegetables, cottage cheese (parsley-like flavor) Chili Power-Meatloaf, chicken cheese, corn, eggplant, egg dishes Chives-Salads cottage cheese, egg dishes, soups, vegetables, sauces Cilantro-Salsa, casseroles Cinnamon-Baked goods, fruit, pork, lamb, chicken, carrots Cloves-Fruit, baked goods, fish, pot roast, green beans, beets, carrots Coriander-Pastry, cookies, meat, salads, cheese (lemon-orange flavor) Cumin-Meatloaf, fish,cheese, eggs, cabbage,fruit pie (caraway flavor) [employer], fruit, eggs, fish, poultry, cottage cheese, vegetables Dill Seed-Meat, cottage cheese, poultry, vegetables, fish,  salads, bread Fennel Seed-Bread, cookies, apples, pork, eggs, fish, beets, cabbage, cheese, Licorice-like flavor Garlic-(buds or powder) Salads, meat, poultry, fish, bread, butter, vegetables, potatoes.Do  not  use garlic salt Ginger-Fruit, vegetables, baked goods, meat, fish, poultry Horseradish Root-Meet, vegetables, butter Lemon Juice or Extract-Vegetables, fruit, tea, baked goods, fish salads Mace-Baked goods fruit, vegetables, fish, poultry (taste like nutmeg) Maple Extract-Syrups Marjoram-Meat, chicken, fish, vegetables, breads, green salads (taste like Sage) Mint-Tea, lamb, sherbet, vegetables, desserts, carrots, cabbage Mustard, Dry or Seed-Cheese, eggs, meats, vegetables, poultry Nutmeg-Baked goods, fruit, chicken, eggs, vegetables, desserts Onion Powder-Meat, fish, poultry, vegetables, cheese, eggs, bread, rice salads (Do not use   Onion salt) Orange Extract-Desserts, baked goods Oregano-Pasta, eggs, cheese, onions, pork, lamb, fish, chicken, vegetables, green salads Paprika-Meat, fish, poultry, eggs, cheese, vegetables Parsley Flakes-Butter, vegetables, meat fish, poultry, eggs, bread, salads (certain forms may   Contain sodium Pepper-Meat fish, poultry, vegetables, eggs Peppermint Extract-Desserts, baked goods Poppy Seed-Eggs, bread, cheese, fruit dressings, baked goods, noodles, vegetables, cottage  [employer], poultry, meat, fish, cauliflower, turnips,eggs bread Saffron-Rice, bread, veal, chicken, fish, eggs Sage-Meat, fish, poultry, onions, eggplant, tomateos, pork, stews Savory-Eggs, salads, poultry, meat, rice, vegetables, soups, pork Tarragon-Meat, poultry, fish, eggs, butter, vegetables (licorice-like flavor)  Thyme-Meat, poultry, fish, eggs, vegetables, (clover-like flavor), sauces, soups Tumeric-Salads, butter, eggs, fish, rice, vegetables (saffron-like flavor) Vanilla Extract-Baked goods, candy Vinegar-Salads, vegetables, meat marinades Walnut Extract-baked goods, candy  2. Choose your Foods Wisely   The following is a list of foods to avoid which are high in sodium:  Meats-Avoid all smoked, canned, salt cured, dried and  kosher meat and fish as well as Anchovies   [employer] meats:Bologna, Liverwurst, Pastrami Canned meat or fish  Marinated herring Caviar    Pepperoni Corned Beef   Pizza Dried chipped beef  Salami Frozen breaded fish or meat Salt pork Frankfurters or hot dogs  Sardines Gefilte fish   Sausage Ham (boiled ham, Proscuitto Smoked butt    spiced ham)   Spam      TV Dinners Vegetables Canned vegetables (Regular) Relish Canned mushrooms  Sauerkraut Olives    Tomato juice Pickles  Bakery and Dessert Products Canned puddings  Cream pies Cheesecake   Decorated cakes Cookies  Beverages/Juices Tomato juice, regular  Gatorade   V-8 vegetable juice, regular  Breads and Cereals Biscuit mixes   Salted potato chips, corn chips, pretzels Bread stuffing mixes  Salted crackers and rolls Pancake and waffle mixes Self-rising flour  Seasonings Accent    Meat sauces Barbecue sauce  Meat tenderizer Catsup    Monosodium glutamate (MSG) Celery salt   Onion salt Chili sauce   Prepared mustard Garlic salt   Salt, seasoned salt, sea salt Gravy mixes   Soy sauce Horseradish   Steak sauce Ketchup   Tartar sauce Lite salt    Teriyaki sauce Marinade mixes   Worcestershire sauce  Others Baking powder   Cocoa and cocoa mixes Baking soda   Commercial casserole mixes Candy-caramels, chocolate  Dehydrated soups    Bars, fudge,nougats  Instant rice and pasta mixes Canned broth or soup  Maraschino cherries Cheese, aged and processed cheese and cheese spreads  Learning Assessment Quiz  Indicated T (for True) or F (for False) for each of the following statements:  1. _____ Fresh fruits and vegetables and unprocessed grains are generally low in sodium 2. _____ Water may contain a considerable amount of sodium, depending on the source 3. _____ You can always tell if a food  is high in sodium by tasting it 4. _____ Certain laxatives my be high in sodium and should be avoided unless  prescribed   by a physician or pharmacist 5. _____ Salt substitutes may be used freely by anyone on a sodium restricted diet 6. _____ Sodium is present in table salt, food additives and as a natural component of   most foods 7. _____ Table salt is approximately 90% sodium 8. _____ Limiting sodium intake may help prevent excess fluid accumulation in the body 9. _____ On a sodium-restricted diet, seasonings such as bouillon soy sauce, and    cooking wine should be used in place of table salt 10. _____ On an ingredient list, a product which lists monosodium glutamate as the first   ingredient is an appropriate food to include on a low sodium diet  Circle the best answer(s) to the following statements (Hint: there may be more than one correct answer)  11. On a low-sodium diet, some acceptable snack items are:    A. Olives  F. Bean dip   K. Grapefruit juice    B. Salted Pretzels G. Commercial Popcorn   L. Canned peaches    C. Carrot Sticks  H. Bouillon   M. Unsalted nuts   D. [place] fries  I. Peanut butter crackers N. Salami   E. Sweet pickles J. Tomato Juice   O. Pizza  12.  Seasonings that may be used freely on a reduced - sodium diet include   A. Lemon wedges F.Monosodium glutamate K. Celery seed    B.Soysauce   G. Pepper   L. Mustard powder   C. Sea salt  H. Cooking wine  M. Onion flakes   D. Vinegar  E. Prepared horseradish N. Salsa   E. Sage   J. Worcestershire sauce  O. Chutney

## 2019-09-02 ENCOUNTER — Telehealth: Payer: Self-pay | Admitting: Cardiology

## 2019-09-02 NOTE — Telephone Encounter (Signed)
Patient returning Brittany's call in regards to lab results. You can leave detailed message on his phone.

## 2019-09-03 DIAGNOSIS — H34831 Tributary (branch) retinal vein occlusion, right eye, with macular edema: Secondary | ICD-10-CM | POA: Diagnosis not present

## 2019-09-03 NOTE — Telephone Encounter (Signed)
Drue Novel I, RN  09/03/2019 1:59 PM EST    Called and spoke to patient and made him aware of lab results. Patient states that he sometimes has SOB on exertion. I have asked him to decrease his lasix to QOD. Patient states that he is seeing Dr. Carolin Sicks with [facility] early April. Instructed patient to let us know if he develops increased SOB or swelling. Patient verbalized understanding and thanked me for the call.   Drue Novel I, RN  09/03/2019 11:19 AM EST    Left message for patient to call back.   Drue Novel I, RN  09/02/2019 9:13 AM EST    Left message for patient to call back.   Imogene Burn, PA-C  09/02/2019 8:21 AM EST    Kidney function higher. Ask patient to try to take lasix every other day or as needed. Refer to renal for work up and treatment. thanks

## 2019-09-03 NOTE — Telephone Encounter (Signed)
Left message to call back  

## 2019-09-08 NOTE — Progress Notes (Signed)
Cardiology Office Note    Date:  09/14/2019   ID:  Robert Chaney, DOB [birth year], MRN [id number]  PCP:  Robert Ebbs, MD  Cardiologist: Robert Dawley, MD EPS: None  No chief complaint on file.   History of Present Illness:  Robert Chaney is a 69 y.o. male with history of hypertrophic cardiomyopathy septal thickness 21 mm without significant gradient and no SAM.  MRI 10/2016 with the presence of LGE and 8 myocardial segments upper septal thickness 21 mm.  Patient completely asymptomatic and no history of syncope.  Also has essential hypertension, hyperlipidemia, chronic diastolic CHF, CKD stage IV, OSA, DM type 2   I saw patient 09/01/2019 at which time he was feeling bad and had run out of his insulin.  Blood pressure was high but says it was better at home and he got dizzy on higher doses of hydralazine.  He want to try increasing exercise and watching salt closely before increasing meds.  He also had some edema was started on Lasix so I checked renal and Crt 2.37 so I asked him to take lasix every other day and he's scheduled to see renal.  Patient comes in for f/u. BP still high. Watching salt and starting to walk some. Swelling stable on decreased lasix dose.    Past Medical History:  Diagnosis Date  . Altered mental status    a. 05/2017 - adm with blurred vision, somnolence in setting of AKI and high blood sugar.  . Anemia   . CKD (chronic kidney disease), stage III   . Diabetes mellitus   . Diastolic CHF, chronic (HCC)    A.  03/2009 Echo: EF 60-65%, Gr II diast dysfxn  . Elevated troponin    a. 2013 - troponin 1.4. b. 2019 - troponin 0.32; neg nuc 07/2017.  . Glaucoma   . Hyperlipidemia    HYPERCHOLESTEROLEMIA  . Hypertension    MARKED LEFT VENTRICULAR HYPERTROPHY BY PREVIOUS ECHOCARDIOGRAM--HE HAS HYPERDYNAMIC LEFT VENTRICULAR SYSTOLIC FUNCTION AND HAS IMPAIRED RELAXATION BY ECHO  . Hypertrophic cardiomyopathy ([place])   . NSVT (nonsustained ventricular tachycardia)  ([place])   . Premature atrial contractions   . PVC's (premature ventricular contractions)   . SVT (supraventricular tachycardia) (HCC)     Past Surgical History:  Procedure Laterality Date  . NO PAST SURGERIES      Current Medications: Current Meds  Medication Sig  . amLODipine (NORVASC) 10 MG tablet Take 1 tablet (10 mg total) by mouth at bedtime.  Marland Kitchen atorvastatin (LIPITOR) 20 MG tablet Take 1 tablet (20 mg total) by mouth daily.  . carvedilol (COREG) 25 MG tablet Take 1 tablet (25 mg total) by mouth 2 (two) times daily.  . cholecalciferol (VITAMIN D3) 25 MCG (1000 UT) tablet Take 1,000 Units by mouth daily.  . furosemide (LASIX) 20 MG tablet Take 20 mg by mouth every other day.  Marland Kitchen glucose blood (ONETOUCH VERIO) test strip Use as instructed to test blood sugar 3 times daily E11.65  . insulin aspart (NOVOLOG FLEXPEN) 100 UNIT/ML FlexPen Inject 5 Units into the skin 3 (three) times daily with meals. Max daily 21 units  . Insulin Glargine (TOUJEO MAX SOLOSTAR) 300 UNIT/ML SOPN Inject 20 Units into the skin at bedtime.  . Insulin Pen Needle (B-D UF III MINI PEN NEEDLES) 31G X 5 MM MISC Four times daily  . isosorbide mononitrate (IMDUR) 60 MG 24 hr tablet Take 1 tablet (60 mg total) by mouth daily.  Marland Kitchen losartan-hydrochlorothiazide (HYZAAR) 50-12.5 MG tablet  Take 1 tablet by mouth every morning.  . nitroGLYCERIN (NITROSTAT) 0.4 MG SL tablet Place 1 tablet (0.4 mg total) under the tongue every 5 (five) minutes as needed for chest pain.  . [DISCONTINUED] hydrALAZINE (APRESOLINE) 50 MG tablet Take 1.5 tablets (75 mg total) by mouth 3 (three) times daily.     Allergies:   Aspirin   Social History   Socioeconomic History  . Marital status: Legally Separated    Spouse name: Not on file  . Number of children: Not on file  . Years of education: Not on file  . Highest education level: Not on file  Occupational History  . Occupation: Forensic psychologist: [employer]    Comment: At [employer]  . Smoking status: Never Smoker  . Smokeless tobacco: Never Used  Substance and Sexual Activity  . Alcohol use: No  . Drug use: No  . Sexual activity: Never  Other Topics Concern  . Not on file  Social History Narrative   Originally from [place].    Social Determinants of Health   Financial Resource Strain:   . Difficulty of Paying Living Expenses:   Food Insecurity:   . Worried About Charity fundraiser in the Last Year:   . Arboriculturist in the Last Year:   Transportation Needs:   . Film/video editor (Medical):   Marland Kitchen Lack of Transportation (Non-Medical):   Physical Activity:   . Days of Exercise per Week:   . Minutes of Exercise per Session:   Stress:   . Feeling of Stress :   Social Connections:   . Frequency of Communication with Friends and Family:   . Frequency of Social Gatherings with Friends and Family:   . Attends Religious Services:   . Active Member of Clubs or Organizations:   . Attends Archivist Meetings:   Marland Kitchen Marital Status:      Family History:  The patient's   family history includes Diabetes in his brother and brother; Hypertension in his father.   ROS:   Please see the history of present illness.    ROS All other systems reviewed and are negative.   PHYSICAL EXAM:   VS:  BP (!) 168/90   Pulse 67   Ht 5\' 8"  (1.727 m)   Wt 189 lb (85.7 kg)   SpO2 94%   BMI 28.74 kg/m   Physical Exam  GEN: Well nourished, well developed, in no acute distress  Neck: no JVD, carotid bruits, or masses Cardiac:RRR; S4, 2/6 systolic murmur LSB  Respiratory:  clear to auscultation bilaterally, normal work of breathing GI: soft, nontender, nondistended, + BS Ext: without cyanosis, clubbing, or edema, Good distal pulses bilaterally Neuro:  Alert and Oriented x 3 Psych: euthymic mood, full affect  Wt Readings from Last 3 Encounters:  09/14/19 189 lb (85.7 kg)  09/01/19 190 lb 12.8 oz (86.5 kg)  07/19/19 191 lb (86.6 kg)       Studies/Labs Reviewed:   EKG:  EKG is not ordered today.    Recent Labs: 02/16/2019: Magnesium 2.0 09/01/2019: BUN 28; Creatinine, Ser 2.37; Potassium 4.5; Sodium 140   Lipid Panel    Component Value Date/Time   CHOL 157 11/07/2016 0922   TRIG 81 11/07/2016 0922   HDL 59 11/07/2016 0922   CHOLHDL 2.7 11/07/2016 0922   CHOLHDL 3 05/04/2014 0852   VLDL 13.6 05/04/2014 0852   LDLCALC 82 11/07/2016 4818  Additional studies/ records that were reviewed today include:    Echo 01/2019 IMPRESSIONS     1. The left ventricle has normal systolic function with an ejection  fraction of 60-65%. The cavity size was normal. There is severe increase  LV wall thickness. Findings are consistent with hypertrophic  cardiomyopathy. Peak LVOT velocity at rest was  61mmHg that increased to 78mmHg with Valsalva. Left ventricular diastolic  Doppler parameters are consistent with pseudonormalization. Elevated left  ventricular end-diastolic pressure No evidence of left ventricular  regional wall motion abnormalities.   2. The right ventricle has normal systolic function. The cavity was  normal. There is no increase in right ventricular wall thickness.   3. Left atrial size was mildly dilated.   4. The pericardial effusion is circumferential.   5. Trivial pericardial effusion is present.   6. The aortic valve is tricuspid. Mild sclerosis of the aortic valve.   7. There is mild dilatation of the aortic root measuring 39 mm.   8. Findings consistent with HOCM. There is a dynamic LVOT gradient of  49mmHg at rest that increased to 79mmHg with Valsalva. No leaflet or  chordal SAM.        ASSESSMENT:    1. Hypertrophic cardiomyopathy ([place])   2. Essential hypertension   3. Chronic diastolic CHF (congestive heart failure) ([place])   4. Hyperlipidemia, unspecified hyperlipidemia type   5. Type 2 diabetes mellitus with retinopathy, with long-term current use of insulin, macular edema presence  unspecified, unspecified laterality, unspecified retinopathy severity (HCC)   6. Stage 3 chronic kidney disease, unspecified whether stage 3a or 3b CKD   7. PVC's (premature ventricular contractions)      PLAN:  In order of problems listed above:   Hypertrophic cardiomyopathy septal thickness 21 mm on MRI 10/2016   Essential hypertension-BP was elevated when I saw him 09/01/2019 and he became dizzy on higher doses of hydralazine. He  wanted to try to watch his salt closely and increase exercise before adding another medicine.  Chronic diastolic CHF echo 0/6237 normal LVEF 60-65% HOCM peak LVOT rest 74mmg increased 84mmHg with valsalva. BP still high. Will try to go up on hydralazine 100 mg tid and refer to [place] with HTN clinic. Recheck bmet today.  Diastolic dysfunction-mild edema on exam and started on lasix, his creatinine was elevated to 2.37.  I asked him to take his Lasix every day other day. Recheck today and he has follow-up with renal in April.    Dyslipidemia on lipitor   DM type II followed by endocrine-ran out of insulin yesterday but contacted his Dr. And will pick it up today.   CKD stage III last creatinine 2.37-3/03/2020   PVC's controlled with carvedilol-holter 04/2019 low PVC load       Medication Adjustments/Labs and Tests Ordered: Current medicines are reviewed at length with the patient today.  Concerns regarding medicines are outlined above.  Medication changes, Labs and Tests ordered today are listed in the Patient Instructions below. Patient Instructions  Medication Instructions:  Your physician has recommended you make the following change in your medication: 1) INCREASE hydralazine to 100 mg three times per day   *If you need a refill on your cardiac medications before your next appointment, please call your pharmacy*  Lab Work: TODAY: BMET If you have labs (blood work) drawn today and your tests are completely normal, you will receive your results  only by: Marland Kitchen MyChart Message (if you have MyChart) OR . A paper copy  in the mail If you have any lab test that is abnormal or we need to change your treatment, we will call you to review the results.  Follow-Up: At [facility], you and your health needs are our priority.  As part of our continuing mission to provide you with exceptional heart care, we have created designated Provider Care Teams.  These Care Teams include your primary Cardiologist (physician) and Advanced Practice Providers (APPs -  Physician Assistants and Nurse Practitioners) who all work together to provide you with the care you need, when you need it.  We recommend signing up for the patient portal called "MyChart".  Sign up information is provided on this After Visit Summary.  MyChart is used to connect with patients for Virtual Visits (Telemedicine).  Patients are able to view lab/test results, encounter notes, upcoming appointments, etc.  Non-urgent messages can be sent to your provider as well.   To learn more about what you can do with MyChart, go to [email or web address].    Other Instructions You have been referred to see Dr. Oval Linsey in the hypertension clinic.      Sumner Boast, PA-C  09/14/2019 11:29 AM    [facility] [address], [place], [place]  [address] Phone: [phone]; Fax: [phone]

## 2019-09-14 ENCOUNTER — Other Ambulatory Visit: Payer: Self-pay

## 2019-09-14 ENCOUNTER — Encounter: Payer: Self-pay | Admitting: Physician Assistant

## 2019-09-14 ENCOUNTER — Ambulatory Visit: Payer: PPO | Admitting: Physician Assistant

## 2019-09-14 VITALS — BP 168/90 | HR 67 | Ht 68.0 in | Wt 189.0 lb

## 2019-09-14 DIAGNOSIS — N183 Chronic kidney disease, stage 3 unspecified: Secondary | ICD-10-CM

## 2019-09-14 DIAGNOSIS — I422 Other hypertrophic cardiomyopathy: Secondary | ICD-10-CM | POA: Diagnosis not present

## 2019-09-14 DIAGNOSIS — E785 Hyperlipidemia, unspecified: Secondary | ICD-10-CM

## 2019-09-14 DIAGNOSIS — I5032 Chronic diastolic (congestive) heart failure: Secondary | ICD-10-CM | POA: Diagnosis not present

## 2019-09-14 DIAGNOSIS — Z794 Long term (current) use of insulin: Secondary | ICD-10-CM

## 2019-09-14 DIAGNOSIS — I493 Ventricular premature depolarization: Secondary | ICD-10-CM | POA: Diagnosis not present

## 2019-09-14 DIAGNOSIS — I10 Essential (primary) hypertension: Secondary | ICD-10-CM

## 2019-09-14 DIAGNOSIS — E11319 Type 2 diabetes mellitus with unspecified diabetic retinopathy without macular edema: Secondary | ICD-10-CM | POA: Diagnosis not present

## 2019-09-14 LAB — BASIC METABOLIC PANEL
BUN/Creatinine Ratio: 14 (ref 10–24)
BUN: 38 mg/dL — ABNORMAL HIGH (ref 8–27)
CO2: 24 mmol/L (ref 20–29)
Calcium: 9.1 mg/dL (ref 8.6–10.2)
Chloride: 105 mmol/L (ref 96–106)
Creatinine, Ser: 2.69 mg/dL — ABNORMAL HIGH (ref 0.76–1.27)
GFR calc Af Amer: 27 mL/min/{1.73_m2} — ABNORMAL LOW (ref >59)
GFR calc non Af Amer: 23 mL/min/{1.73_m2} — ABNORMAL LOW (ref >59)
Glucose: 141 mg/dL — ABNORMAL HIGH (ref 65–99)
Potassium: 4.9 mmol/L (ref 3.5–5.2)
Sodium: 140 mmol/L (ref 134–144)

## 2019-09-14 MED ORDER — HYDRALAZINE HCL 100 MG PO TABS: 100.0000 mg | ORAL_TABLET | Freq: Two times a day (BID) | ORAL | 3 refills | Status: DC

## 2019-09-14 NOTE — Patient Instructions (Signed)
Medication Instructions:  Your physician has recommended you make the following change in your medication: 1) INCREASE hydralazine to 100 mg three times per day   *If you need a refill on your cardiac medications before your next appointment, please call your pharmacy*  Lab Work: TODAY: BMET If you have labs (blood work) drawn today and your tests are completely normal, you will receive your results only by: Marland Kitchen MyChart Message (if you have MyChart) OR . A paper copy in the mail If you have any lab test that is abnormal or we need to change your treatment, we will call you to review the results.  Follow-Up: At [facility], you and your health needs are our priority.  As part of our continuing mission to provide you with exceptional heart care, we have created designated Provider Care Teams.  These Care Teams include your primary Cardiologist (physician) and Advanced Practice Providers (APPs -  Physician Assistants and Nurse Practitioners) who all work together to provide you with the care you need, when you need it.  We recommend signing up for the patient portal called "MyChart".  Sign up information is provided on this After Visit Summary.  MyChart is used to connect with patients for Virtual Visits (Telemedicine).  Patients are able to view lab/test results, encounter notes, upcoming appointments, etc.  Non-urgent messages can be sent to your provider as well.   To learn more about what you can do with MyChart, go to [email or web address].    Other Instructions You have been referred to see Dr. Oval Linsey in the hypertension clinic.

## 2019-09-15 ENCOUNTER — Other Ambulatory Visit: Payer: Self-pay

## 2019-09-15 ENCOUNTER — Telehealth: Payer: Self-pay | Admitting: Physician Assistant

## 2019-09-15 NOTE — Telephone Encounter (Signed)
Tayler called returning [name]'s call in regards to his lab results. I reached out to [place] through secure chat and then transferred the call over to her.

## 2019-09-16 ENCOUNTER — Ambulatory Visit: Payer: PPO | Attending: Internal Medicine

## 2019-09-16 ENCOUNTER — Telehealth: Payer: Self-pay | Admitting: *Deleted

## 2019-09-16 DIAGNOSIS — Z23 Encounter for immunization: Secondary | ICD-10-CM

## 2019-09-16 NOTE — Telephone Encounter (Signed)
Patient scheduled for appointment with Dr Oval Linsey 4/14 Did offer week prior however day offered was not good for patient  Patient aware of date, time and location

## 2019-09-16 NOTE — Progress Notes (Signed)
   Covid-19 Vaccination Clinic  Name:  Robert Chaney    MRN: [id number] DOB: 12-Mar-1951  09/16/2019  [name] was observed post Covid-19 immunization for 15 minutes without incident. He was provided with Vaccine Information Sheet and instruction to access the V-Safe system.   [name] was instructed to call 911 with any severe reactions post vaccine: Marland Kitchen Difficulty breathing  . Swelling of face and throat  . A fast heartbeat  . A bad rash all over body  . Dizziness and weakness   Immunizations Administered    Name Date Dose VIS Date Route   Pfizer COVID-19 Vaccine 09/16/2019 11:08 AM 0.3 mL 06/04/2019 Intramuscular   Manufacturer: [place]   Lot: [id number]   [place]: [address]-8

## 2019-09-17 DIAGNOSIS — E1122 Type 2 diabetes mellitus with diabetic chronic kidney disease: Secondary | ICD-10-CM | POA: Diagnosis not present

## 2019-09-17 DIAGNOSIS — I10 Essential (primary) hypertension: Secondary | ICD-10-CM | POA: Diagnosis not present

## 2019-09-17 DIAGNOSIS — E7849 Other hyperlipidemia: Secondary | ICD-10-CM | POA: Diagnosis not present

## 2019-09-17 DIAGNOSIS — I509 Heart failure, unspecified: Secondary | ICD-10-CM | POA: Diagnosis not present

## 2019-10-01 DIAGNOSIS — H34831 Tributary (branch) retinal vein occlusion, right eye, with macular edema: Secondary | ICD-10-CM | POA: Diagnosis not present

## 2019-10-06 ENCOUNTER — Encounter: Payer: Self-pay | Admitting: Cardiovascular Disease

## 2019-10-06 ENCOUNTER — Ambulatory Visit (INDEPENDENT_AMBULATORY_CARE_PROVIDER_SITE_OTHER): Payer: PPO | Admitting: Cardiovascular Disease

## 2019-10-06 ENCOUNTER — Telehealth: Payer: Self-pay | Admitting: *Deleted

## 2019-10-06 ENCOUNTER — Other Ambulatory Visit: Payer: Self-pay

## 2019-10-06 VITALS — BP 148/78 | HR 69 | Ht 68.0 in | Wt 193.0 lb

## 2019-10-06 DIAGNOSIS — I10 Essential (primary) hypertension: Secondary | ICD-10-CM | POA: Diagnosis not present

## 2019-10-06 DIAGNOSIS — I422 Other hypertrophic cardiomyopathy: Secondary | ICD-10-CM

## 2019-10-06 DIAGNOSIS — I119 Hypertensive heart disease without heart failure: Secondary | ICD-10-CM

## 2019-10-06 DIAGNOSIS — I5032 Chronic diastolic (congestive) heart failure: Secondary | ICD-10-CM | POA: Diagnosis not present

## 2019-10-06 DIAGNOSIS — E785 Hyperlipidemia, unspecified: Secondary | ICD-10-CM

## 2019-10-06 DIAGNOSIS — E7800 Pure hypercholesterolemia, unspecified: Secondary | ICD-10-CM | POA: Diagnosis not present

## 2019-10-06 DIAGNOSIS — G4733 Obstructive sleep apnea (adult) (pediatric): Secondary | ICD-10-CM

## 2019-10-06 DIAGNOSIS — Z5181 Encounter for therapeutic drug level monitoring: Secondary | ICD-10-CM | POA: Diagnosis not present

## 2019-10-06 DIAGNOSIS — I421 Obstructive hypertrophic cardiomyopathy: Secondary | ICD-10-CM | POA: Diagnosis not present

## 2019-10-06 MED ORDER — CARVEDILOL 25 MG PO TABS: 25.0000 mg | ORAL_TABLET | Freq: Two times a day (BID) | ORAL | 2 refills | Status: DC

## 2019-10-06 MED ORDER — AMLODIPINE BESYLATE 10 MG PO TABS: 10.0000 mg | ORAL_TABLET | Freq: Every day | ORAL | 1 refills | Status: DC

## 2019-10-06 NOTE — Patient Instructions (Addendum)
Medication Instructions:  RESUME THE AMLODIPINE 10 MG DAILY   RESUME CARVEDILOL 25 MG TWICE A DAY   STOP LOSARTAN HCT  START LASIX (FUROSEMIDE) 20 MG DAILY (1/2 OF THE 40 MG TABLET)    Labwork: BMET IN 1 WEEK    Testing/Procedures: NONE   Follow-Up: Your physician wants you to follow-up in: 101 MONTHS WITH DR Meadow, WILL CALL YO WITH APPOINTMENT  Your physician recommends that you schedule a follow-up appointment in: PHARM D 5/182021 AT 3:00    You will receive a phone call from the PREP exercise and nutrition program to schedule an initial assessment.   Special Instructions:   MONITOR YOUR BLOOD PRESSURE TWICE A DAY, LOG IN BOOK PROVIDED  YOU WILL GET A HOME MONITORING BLOOD PRESSURE SYSTEM, CONTINUE TO MONITOR TWICE A DAY AFTER RECEIVING   DASH Eating Plan DASH stands for "Dietary Approaches to Stop Hypertension." The DASH eating plan is a healthy eating plan that has been shown to reduce high blood pressure (hypertension). It may also reduce your risk for type 2 diabetes, heart disease, and stroke. The DASH eating plan may also help with weight loss. What are tips for following this plan?  General guidelines  Avoid eating more than 2,300 mg (milligrams) of salt (sodium) a day. If you have hypertension, you may need to reduce your sodium intake to 1,500 mg a day.  Limit alcohol intake to no more than 1 drink a day for nonpregnant women and 2 drinks a day for men. One drink equals 12 oz of beer, 5 oz of wine, or 1 oz of hard liquor.  Work with your health care provider to maintain a healthy body weight or to lose weight. Ask what an ideal weight is for you.  Get at least 30 minutes of exercise that causes your heart to beat faster (aerobic exercise) most days of the week. Activities may include walking, swimming, or biking.  Work with your health care provider or diet and nutrition specialist (dietitian) to adjust your eating plan to your individual calorie needs. Reading  food labels   Check food labels for the amount of sodium per serving. Choose foods with less than 5 percent of the Daily Value of sodium. Generally, foods with less than 300 mg of sodium per serving fit into this eating plan.  To find whole grains, look for the word "whole" as the first word in the ingredient list. Shopping  Buy products labeled as "low-sodium" or "no salt added."  Buy fresh foods. Avoid canned foods and premade or frozen meals. Cooking  Avoid adding salt when cooking. Use salt-free seasonings or herbs instead of table salt or sea salt. Check with your health care provider or pharmacist before using salt substitutes.  Do not fry foods. Cook foods using healthy methods such as baking, boiling, grilling, and broiling instead.  Cook with heart-healthy oils, such as olive, canola, soybean, or sunflower oil. Meal planning  Eat a balanced diet that includes: ? 5 or more servings of fruits and vegetables each day. At each meal, try to fill half of your plate with fruits and vegetables. ? Up to 6-8 servings of whole grains each day. ? Less than 6 oz of lean meat, poultry, or fish each day. A 3-oz serving of meat is about the same size as a deck of cards. One egg equals 1 oz. ? 2 servings of low-fat dairy each day. ? A serving of nuts, seeds, or beans 5 times each week. ? Heart-healthy  fats. Healthy fats called Omega-3 fatty acids are found in foods such as flaxseeds and coldwater fish, like sardines, salmon, and mackerel.  Limit how much you eat of the following: ? Canned or prepackaged foods. ? Food that is high in trans fat, such as fried foods. ? Food that is high in saturated fat, such as fatty meat. ? Sweets, desserts, sugary drinks, and other foods with added sugar. ? Full-fat dairy products.  Do not salt foods before eating.  Try to eat at least 2 vegetarian meals each week.  Eat more home-cooked food and less restaurant, buffet, and fast food.  When eating at  a restaurant, ask that your food be prepared with less salt or no salt, if possible. What foods are recommended? The items listed may not be a complete list. Talk with your dietitian about what dietary choices are best for you. Grains Whole-grain or whole-wheat bread. Whole-grain or whole-wheat pasta. Brown rice. [place]. Bulgur. Whole-grain and low-sodium cereals. Pita bread. Low-fat, low-sodium crackers. Whole-wheat flour tortillas. Vegetables Fresh or frozen vegetables (raw, steamed, roasted, or grilled). Low-sodium or reduced-sodium tomato and vegetable juice. Low-sodium or reduced-sodium tomato sauce and tomato paste. Low-sodium or reduced-sodium canned vegetables. Fruits All fresh, dried, or frozen fruit. Canned fruit in natural juice (without added sugar). Meat and other protein foods Skinless chicken or [place]. Ground chicken or [place]. Pork with fat trimmed off. Fish and seafood. Egg whites. Dried beans, peas, or lentils. Unsalted nuts, nut butters, and seeds. Unsalted canned beans. Lean cuts of beef with fat trimmed off. Low-sodium, lean deli meat. Dairy Low-fat (1%) or fat-free (skim) milk. Fat-free, low-fat, or reduced-fat cheeses. Nonfat, low-sodium ricotta or cottage cheese. Low-fat or nonfat yogurt. Low-fat, low-sodium cheese. Fats and oils Soft margarine without trans fats. Vegetable oil. Low-fat, reduced-fat, or light mayonnaise and salad dressings (reduced-sodium). Canola, safflower, olive, soybean, and sunflower oils. Avocado. Seasoning and other foods Herbs. Spices. Seasoning mixes without salt. Unsalted popcorn and pretzels. Fat-free sweets. What foods are not recommended? The items listed may not be a complete list. Talk with your dietitian about what dietary choices are best for you. Grains Baked goods made with fat, such as croissants, muffins, or some breads. Dry pasta or rice meal packs. Vegetables Creamed or fried vegetables. Vegetables in a cheese sauce.  Regular canned vegetables (not low-sodium or reduced-sodium). Regular canned tomato sauce and paste (not low-sodium or reduced-sodium). Regular tomato and vegetable juice (not low-sodium or reduced-sodium). Angie Fava. Olives. Fruits Canned fruit in a light or heavy syrup. Fried fruit. Fruit in cream or butter sauce. Meat and other protein foods Fatty cuts of meat. Ribs. Fried meat. Berniece Salines. Sausage. Bologna and other processed lunch meats. Salami. Fatback. Hotdogs. Bratwurst. Salted nuts and seeds. Canned beans with added salt. Canned or smoked fish. Whole eggs or egg yolks. Chicken or [place] with skin. Dairy Whole or 2% milk, cream, and half-and-half. Whole or full-fat cream cheese. Whole-fat or sweetened yogurt. Full-fat cheese. Nondairy creamers. Whipped toppings. Processed cheese and cheese spreads. Fats and oils Butter. Stick margarine. Lard. Shortening. Ghee. Bacon fat. Tropical oils, such as coconut, palm kernel, or palm oil. Seasoning and other foods Salted popcorn and pretzels. Onion salt, garlic salt, seasoned salt, table salt, and sea salt. Worcestershire sauce. Tartar sauce. Barbecue sauce. Teriyaki sauce. Soy sauce, including reduced-sodium. Steak sauce. Canned and packaged gravies. Fish sauce. Oyster sauce. Cocktail sauce. Horseradish that you find on the shelf. Ketchup. Mustard. Meat flavorings and tenderizers. Bouillon cubes. Hot sauce and Tabasco sauce. Premade  or packaged marinades. Premade or packaged taco seasonings. Relishes. Regular salad dressings. Where to find more information:  National Heart, Lung, and [place]: https://wilson-eaton.com/  American Heart Association: www.heart.org Summary  The DASH eating plan is a healthy eating plan that has been shown to reduce high blood pressure (hypertension). It may also reduce your risk for type 2 diabetes, heart disease, and stroke.  With the DASH eating plan, you should limit salt (sodium) intake to 2,300 mg a day. If you have  hypertension, you may need to reduce your sodium intake to 1,500 mg a day.  When on the DASH eating plan, aim to eat more fresh fruits and vegetables, whole grains, lean proteins, low-fat dairy, and heart-healthy fats.  Work with your health care provider or diet and nutrition specialist (dietitian) to adjust your eating plan to your individual calorie needs. This information is not intended to replace advice given to you by your health care provider. Make sure you discuss any questions you have with your health care provider. Document Released: 05/30/2011 Document Revised: 05/23/2017 Document Reviewed: 06/03/2016 Elsevier Patient Education  2020 [employer].

## 2019-10-06 NOTE — Progress Notes (Signed)
Hypertension Clinic Initial Assessment:    Date:  10/06/2019   ID:  Robert Chaney, DOB 1950-11-18, MRN [id number]  PCP:  Robert Ebbs, MD  Cardiologist:  Robert Dawley, MD  Nephrologist:  Referring MD: Robert Ebbs, MD   CC: Hypertension  History of Present Illness:    Robert Chaney is a 69 y.o. male with a hx of hypertrophic cardiomyopathy, hypertension, hyperlipidemia, chronic diastolic heart failure, CKD 4, diabetes, and OSA on CPAP here to establish care in the hypertension clinic.  He reports first being diagnosed with hypertension approximately 20 years ago when he moved to the [place] from [place].  Since then he does not think his blood pressure has never been very well have been controlled.  He had an echo 01/2019 that revealed severe LVH with a septal thickness of 2.1 cm.  There is no evidence of a significant outflow tract gradient or SAM on his echo 01/2019.  He has been working with Robert Husk, PA-C.  At his appointment 08/2019 his blood pressure was elevated but he felt that it was improved at home.  He had previously been on higher doses of hydralazine but developed dizziness.  He was started on Lasix due to lower extremity edema but developed some acute renal insufficiency so it was reduced to every other day.  He followed up with Ms. Robert Chaney on 09/14/2019 and his blood pressure remained elevated.  Hydralazine was increased to 100 mg and he was referred to the advanced hypertension clinic for further management.  Robert Chaney notes that he feels generally well.  He does note that he gets short of breath when he tries to exercise.  He walks 3 days/week for approximately a mile.  Since stopping furosemide has had increased lower extremity edema.  He denies orthopnea or PND.  He uses his CPAP regularly.  He denies any over-the-counter supplements or cold and cough medications.  He notes that he drinks a lot of coffee.  He has several cups throughout the day.  He prepares his meals at  home and does not add any salt.  He takes his morning medications around 8 AM in the evening ones at bedtime.  His blood pressure at home has been running in the 160s on average.  Previous antihypertensives: Amlodipine Carvedilol Hydralazine Imdur losartan   Past Medical History:  Diagnosis Date  . Altered mental status    a. 05/2017 - adm with blurred vision, somnolence in setting of AKI and high blood sugar.  . Anemia   . CKD (chronic kidney disease), stage III   . Diabetes mellitus   . Diastolic CHF, chronic (HCC)    A.  03/2009 Echo: EF 60-65%, Gr II diast dysfxn  . Elevated troponin    a. 2013 - troponin 1.4. b. 2019 - troponin 0.32; neg nuc 07/2017.  . Glaucoma   . Hyperlipidemia    HYPERCHOLESTEROLEMIA  . Hypertension    MARKED LEFT VENTRICULAR HYPERTROPHY BY PREVIOUS ECHOCARDIOGRAM--HE HAS HYPERDYNAMIC LEFT VENTRICULAR SYSTOLIC FUNCTION AND HAS IMPAIRED RELAXATION BY ECHO  . Hypertrophic cardiomyopathy ([place])   . NSVT (nonsustained ventricular tachycardia) ([place])   . Premature atrial contractions   . PVC's (premature ventricular contractions)   . SVT (supraventricular tachycardia) (HCC)     Past Surgical History:  Procedure Laterality Date  . NO PAST SURGERIES      Current Medications: No outpatient medications have been marked as taking for the 10/06/19 encounter (Appointment) with Skeet Latch, MD.     Allergies:  Aspirin   Social History   Socioeconomic History  . Marital status: Legally Separated    Spouse name: Not on file  . Number of children: Not on file  . Years of education: Not on file  . Highest education level: Not on file  Occupational History  . Occupation: Forensic psychologist: [employer]    Comment: At [employer]  . Smoking status: Never Smoker  . Smokeless tobacco: Never Used  Substance and Sexual Activity  . Alcohol use: No  . Drug use: No  . Sexual activity: Never  Other Topics Concern  . Not on file    Social History Narrative   Originally from [place].    Social Determinants of Health   Financial Resource Strain:   . Difficulty of Paying Living Expenses:   Food Insecurity:   . Worried About Charity fundraiser in the Last Year:   . Arboriculturist in the Last Year:   Transportation Needs:   . Film/video editor (Medical):   Marland Kitchen Lack of Transportation (Non-Medical):   Physical Activity:   . Days of Exercise per Week:   . Minutes of Exercise per Session:   Stress:   . Feeling of Stress :   Social Connections:   . Frequency of Communication with Friends and Family:   . Frequency of Social Gatherings with Friends and Family:   . Attends Religious Services:   . Active Member of Clubs or Organizations:   . Attends Archivist Meetings:   Marland Kitchen Marital Status:      Family History: The patient's family history includes Diabetes in his brother and brother; Hypertension in his father.  ROS:   Please see the history of present illness.     All other systems reviewed and are negative.  EKGs/Labs/Other Studies Reviewed:    EKG:  EKG is ordered today.  The ekg ordered today demonstrates sinus rhythm.  Rate 69 bpm.  LVH with secondary repolarization abnormalities.  Recent Labs: 02/16/2019: Magnesium 2.0 09/14/2019: BUN 38; Creatinine, Ser 2.69; Potassium 4.9; Sodium 140   Recent Lipid Panel    Component Value Date/Time   CHOL 157 11/07/2016 0922   TRIG 81 11/07/2016 0922   HDL 59 11/07/2016 0922   CHOLHDL 2.7 11/07/2016 0922   CHOLHDL 3 05/04/2014 0852   VLDL 13.6 05/04/2014 0852   LDLCALC 82 11/07/2016 0922    Physical Exam:    VS:  BP (!) 148/78   Pulse 69   Ht 5\' 8"  (1.727 m)   Wt 193 lb (87.5 kg)   SpO2 90%   BMI 29.35 kg/m  , BMI Body mass index is 29.35 kg/m. GENERAL:  Well appearing HEENT: Pupils equal round and reactive, fundi not visualized, oral mucosa unremarkable NECK:  No jugular venous distention, waveform within normal limits, carotid  upstroke brisk and symmetric, no bruits, no thyromegaly LYMPHATICS:  No cervical adenopathy LUNGS:  Clear to auscultation bilaterally HEART:  RRR.  PMI not displaced or sustained,S1 and S2 within normal limits, no S3, no S4, no clicks, no rubs, II/VI systolic murmur ABD:  Flat, positive bowel sounds normal in frequency in pitch, no bruits, no rebound, no guarding, no midline pulsatile mass, no hepatomegaly, no splenomegaly EXT:  2 plus pulses throughout, II/VI LE edema to the upper tibia bilaterally, no cyanosis no clubbing SKIN:  No rashes no nodules NEURO:  Cranial nerves II through XII grossly intact, motor grossly intact throughout PSYCH:  Cognitively intact, oriented to person place and time   ASSESSMENT:    No diagnosis found.  PLAN:    # Essential hypertension: Blood pressure is improving but still poorly controlled.  We will resume amlodipine at 10 mg daily and carvedilol 25 mg twice a day.  He will stop losartan.  We will also start Lasix 20 mg daily to help with edema.  Check basic metabolic panel in 1 week.  He was given information about the DASH diet. he consents to be monitored in our remote patient monitoring program through [facility].  he will track his blood pressure twice daily and understands that these trends will help [place] to adjust his medications as needed prior to his next appointment. He is interested in enrolling in the PREP exercise and nutrition program through the [facility].    # OSA:  Continue CPAP.  Disposition:    FU with MD/PharmD in 1 month    Medication Adjustments/Labs and Tests Ordered: Current medicines are reviewed at length with the patient today.  Concerns regarding medicines are outlined above.  No orders of the defined types were placed in this encounter.  No orders of the defined types were placed in this encounter.    Signed, Skeet Latch, MD  10/06/2019 1:56 PM    [place]

## 2019-10-06 NOTE — Telephone Encounter (Signed)
Patient was to be enrolled in remote blood pressure monitoring through [facility]. Will need patients email address in order to place order.  Left message to call back

## 2019-10-08 ENCOUNTER — Telehealth: Payer: Self-pay

## 2019-10-08 NOTE — Telephone Encounter (Signed)
Call placed reference referral to PREP. Left message requesting call back to discuss.

## 2019-10-11 ENCOUNTER — Ambulatory Visit: Payer: PPO | Attending: Internal Medicine

## 2019-10-11 DIAGNOSIS — Z23 Encounter for immunization: Secondary | ICD-10-CM

## 2019-10-11 NOTE — Progress Notes (Signed)
   Covid-19 Vaccination Clinic  Name:  Robert Chaney    MRN: [id number] DOB: 1950/08/19  10/11/2019  Robert Chaney was observed post Covid-19 immunization for 15 minutes without incident. He was provided with Vaccine Information Sheet and instruction to access the V-Safe system.   Robert Chaney was instructed to call 911 with any severe reactions post vaccine: Marland Kitchen Difficulty breathing  . Swelling of face and throat  . A fast heartbeat  . A bad rash all over body  . Dizziness and weakness   Immunizations Administered    Name Date Dose VIS Date Route   Pfizer COVID-19 Vaccine 10/11/2019 12:56 PM 0.3 mL 08/18/2018 Intramuscular   Manufacturer: [place]   Lot: [id number]   [place]: [address]-0

## 2019-10-21 NOTE — Telephone Encounter (Signed)
Left message to call back  

## 2019-10-29 ENCOUNTER — Other Ambulatory Visit: Payer: PPO

## 2019-10-29 DIAGNOSIS — N183 Chronic kidney disease, stage 3 unspecified: Secondary | ICD-10-CM | POA: Diagnosis not present

## 2019-10-29 DIAGNOSIS — I10 Essential (primary) hypertension: Secondary | ICD-10-CM | POA: Diagnosis not present

## 2019-10-29 DIAGNOSIS — Z5181 Encounter for therapeutic drug level monitoring: Secondary | ICD-10-CM | POA: Diagnosis not present

## 2019-10-29 DIAGNOSIS — N189 Chronic kidney disease, unspecified: Secondary | ICD-10-CM | POA: Diagnosis not present

## 2019-10-30 LAB — BASIC METABOLIC PANEL
BUN/Creatinine Ratio: 14 (ref 10–24)
BUN: 28 mg/dL — ABNORMAL HIGH (ref 8–27)
CO2: 23 mmol/L (ref 20–29)
Calcium: 9 mg/dL (ref 8.6–10.2)
Chloride: 108 mmol/L — ABNORMAL HIGH (ref 96–106)
Creatinine, Ser: 2.02 mg/dL — ABNORMAL HIGH (ref 0.76–1.27)
GFR calc Af Amer: 38 mL/min/{1.73_m2} — ABNORMAL LOW (ref >59)
GFR calc non Af Amer: 32 mL/min/{1.73_m2} — ABNORMAL LOW (ref >59)
Glucose: 139 mg/dL — ABNORMAL HIGH (ref 65–99)
Potassium: 5 mmol/L (ref 3.5–5.2)
Sodium: 142 mmol/L (ref 134–144)

## 2019-11-08 ENCOUNTER — Telehealth: Payer: Self-pay | Admitting: Cardiovascular Disease

## 2019-11-08 NOTE — Telephone Encounter (Signed)
Returned call to patient he stated B/P continues to be elevated systolic 657.Stated he has not received B/P monitor yet.After reviewing chart Will from [facility] B/P monitor needs email before monitor can be sent.Stated he dose not have a email.Advised I will send message to Dr.Kearny's nurse.

## 2019-11-08 NOTE — Telephone Encounter (Signed)
Pt c/o BP issue: STAT if pt c/o blurred vision, one-sided weakness or slurred speech  1. What are your last 5 BP readings? Patient states that his BP stays around 160  2. Are you having any other symptoms (ex. Dizziness, headache, blurred vision, passed out)? Denies these symptoms at this time. States he passed out after his second covid vaccine back in April.   3. What is your BP issue? Patient states he was to receive a monitor in the mail for his BP but has yet to receive it. Please advise.

## 2019-11-08 NOTE — Telephone Encounter (Signed)
Will forward to Dr [place] and Pharm D for review  

## 2019-11-09 ENCOUNTER — Ambulatory Visit: Payer: PPO

## 2019-11-09 NOTE — Telephone Encounter (Signed)
OK - thanks

## 2019-11-09 NOTE — Progress Notes (Deleted)
11/09/2019 Robert Chaney October 18, 1950 [id number]   HPI:  Robert Chaney is a 69 y.o. male patient of Dr ***, with a PMH below who presents today for hypertension clinic evaluation.  Past Medical History:                   Blood Pressure Goal:  130/80  Current Medications:  Family Hx:  Social Hx:  Diet:  Exercise:  Home BP readings:  Intolerances:   Labs:  Wt Readings from Last 3 Encounters:  10/06/19 193 lb (87.5 kg)  09/14/19 189 lb (85.7 kg)  09/01/19 190 lb 12.8 oz (86.5 kg)   BP Readings from Last 3 Encounters:  10/06/19 (!) 148/78  09/14/19 (!) 168/90  09/01/19 (!) 160/84   Pulse Readings from Last 3 Encounters:  10/06/19 69  09/14/19 67  09/01/19 68    Current Outpatient Medications  Medication Sig Dispense Refill  . amLODipine (NORVASC) 10 MG tablet Take 1 tablet (10 mg total) by mouth at bedtime. 90 tablet 1  . atorvastatin (LIPITOR) 20 MG tablet Take 1 tablet (20 mg total) by mouth daily. 90 tablet 2  . brimonidine (ALPHAGAN) 0.2 % ophthalmic solution Place 1 drop into the right eye 2 (two) times daily.    . carvedilol (COREG) 25 MG tablet Take 1 tablet (25 mg total) by mouth 2 (two) times daily. 180 tablet 2  . cholecalciferol (VITAMIN D3) 25 MCG (1000 UT) tablet Take 1,000 Units by mouth daily.    . furosemide (LASIX) 40 MG tablet Take 40 mg by mouth as directed. 1/2 TABLET DAILY    . glucose blood (ONETOUCH VERIO) test strip Use as instructed to test blood sugar 3 times daily E11.65 100 each 12  . hydrALAZINE (APRESOLINE) 100 MG tablet Take 1 tablet (100 mg total) by mouth 2 (two) times daily. 90 tablet 3  . insulin aspart (NOVOLOG FLEXPEN) 100 UNIT/ML FlexPen Inject 5 Units into the skin 3 (three) times daily with meals. Max daily 21 units 15 mL 11  . insulin aspart (NOVOLOG) 100 UNIT/ML injection SMARTSIG:6 Unit(s) SUB-Q 3 Times Daily    . Insulin Glargine (TOUJEO MAX SOLOSTAR) 300 UNIT/ML SOPN Inject 20 Units into the skin at bedtime.      . Insulin Pen Needle (B-D UF III MINI PEN NEEDLES) 31G X 5 MM MISC Four times daily 150 each 6  . isosorbide mononitrate (IMDUR) 60 MG 24 hr tablet Take 1 tablet (60 mg total) by mouth daily. 90 tablet 2  . latanoprost (XALATAN) 0.005 % ophthalmic solution Place 1 drop into the left eye at bedtime.    . nitroGLYCERIN (NITROSTAT) 0.4 MG SL tablet Place 1 tablet (0.4 mg total) under the tongue every 5 (five) minutes as needed for chest pain. 3 tablet 2   No current facility-administered medications for this visit.    Allergies  Allergen Reactions  . Aspirin Itching    itching    Past Medical History:  Diagnosis Date  . Altered mental status    a. 05/2017 - adm with blurred vision, somnolence in setting of AKI and high blood sugar.  . Anemia   . CKD (chronic kidney disease), stage III   . Diabetes mellitus   . Diastolic CHF, chronic (HCC)    A.  03/2009 Echo: EF 60-65%, Gr II diast dysfxn  . Elevated troponin    a. 2013 - troponin 1.4. b. 2019 - troponin 0.32; neg nuc 07/2017.  . Glaucoma   .  Hyperlipidemia    HYPERCHOLESTEROLEMIA  . Hypertension    MARKED LEFT VENTRICULAR HYPERTROPHY BY PREVIOUS ECHOCARDIOGRAM--HE HAS HYPERDYNAMIC LEFT VENTRICULAR SYSTOLIC FUNCTION AND HAS IMPAIRED RELAXATION BY ECHO  . Hypertrophic cardiomyopathy ([place])   . NSVT (nonsustained ventricular tachycardia) ([place])   . Premature atrial contractions   . PVC's (premature ventricular contractions)   . SVT (supraventricular tachycardia) (HCC)     There were no vitals taken for this visit.  No problem-specific Assessment & Plan notes found for this encounter.   Tommy Medal PharmD CPP [facility] [address] [place], [place] [address] [phone]

## 2019-11-09 NOTE — Telephone Encounter (Signed)
Patient already schedule for HTN clinic today (5/18) at 3pm. We will discuss therapy changes and BP monitoring during appointment

## 2019-11-09 NOTE — Telephone Encounter (Signed)
Pt missed CVRR appointment.  LMOM to discuss re-scheduling as well as home BP readings

## 2019-11-10 MED ORDER — CLONIDINE HCL 0.1 MG PO TABS: 0.1000 mg | ORAL_TABLET | Freq: Once | ORAL | 11 refills | Status: DC

## 2019-11-10 NOTE — Telephone Encounter (Signed)
Returned call to patient.  He has been taking amlodipine 10 mg qd, carvedilol 25 mg bid and hydralazine 100 mg tid.  Notes home BP readings still mostly 366-815 systolic range.    Most recent SCr was 2.02 with previous levels to 2.69 about a month ago. He had just stopped taking losartan at that time.  Looks as though his SCr started increasing once he was on the losartan.   CrCl is 40.6 based on today's weight of 186 lb.    Don't want to use a diuretic or ACEI/ARB at this time due to fluctuating SCr.  Will start him on clonidine 0.1 mg bid.  Re-scheduled his ADV HTN office visit for June 4 at 8:15 am.

## 2019-11-10 NOTE — Telephone Encounter (Signed)
Pt returned the call while we were on lunch and left [place] a msg. I will route to [place]

## 2019-11-10 NOTE — Telephone Encounter (Signed)
Patient has no email address will not be able to enroll in program at this time

## 2019-11-17 ENCOUNTER — Other Ambulatory Visit: Payer: Self-pay

## 2019-11-19 ENCOUNTER — Encounter: Payer: Self-pay | Admitting: Dietician

## 2019-11-19 ENCOUNTER — Encounter: Payer: PPO | Attending: Internal Medicine | Admitting: Dietician

## 2019-11-19 ENCOUNTER — Other Ambulatory Visit: Payer: Self-pay

## 2019-11-19 ENCOUNTER — Encounter: Payer: Self-pay | Admitting: Internal Medicine

## 2019-11-19 ENCOUNTER — Ambulatory Visit (INDEPENDENT_AMBULATORY_CARE_PROVIDER_SITE_OTHER): Payer: PPO | Admitting: Internal Medicine

## 2019-11-19 VITALS — BP 138/68 | HR 86 | Temp 97.9°F | Ht 68.0 in | Wt 194.6 lb

## 2019-11-19 DIAGNOSIS — E1142 Type 2 diabetes mellitus with diabetic polyneuropathy: Secondary | ICD-10-CM | POA: Diagnosis not present

## 2019-11-19 DIAGNOSIS — E118 Type 2 diabetes mellitus with unspecified complications: Secondary | ICD-10-CM

## 2019-11-19 DIAGNOSIS — Z794 Long term (current) use of insulin: Secondary | ICD-10-CM

## 2019-11-19 LAB — POCT GLYCOSYLATED HEMOGLOBIN (HGB A1C): Hemoglobin A1C: 6 % — AB (ref 4.0–5.6)

## 2019-11-19 MED ORDER — INSULIN LISPRO (1 UNIT DIAL) 100 UNIT/ML (KWIKPEN): 5.0000 [IU] | PEN_INJECTOR | Freq: Three times a day (TID) | SUBCUTANEOUS | 11 refills | Status: DC

## 2019-11-19 NOTE — Patient Instructions (Signed)
Continue the changes that you have made: Decreased oil Decreased snacks           Avoid using salt at the table and in cooking. Eat a snack only when hungry- unsalted peanuts are fine Drink adequate water Consider choosing a fruit and vegetable with each meal. Continue to check your blood sugar Continue to take your medication as prescribed  Discuss your swelling feet, legs, and increased shortness of breath with exercise as well as poor sleep with your cardiologist. Follow your doctor's recommendations regarding exercise.
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
## 2019-11-19 NOTE — Progress Notes (Signed)
Name: Robert Chaney  Age/ Sex: 69 y.o., male   MRN/ DOB: [id number], 16-Dec-1950     PCP: [name], MD   Reason for Endocrinology Evaluation: Type 2 Diabetes Mellitus  Initial Endocrine Consultative Visit: 06/30/2018    PATIENT IDENTIFIER: Robert Chaney is a 69 y.o. male with a past medical history of T2DM, HTN, hypertrophic cardiomyopathy, and CKD III. The patient has followed with Endocrinology clinic since 06/30/2018 for consultative assistance with management of his diabetes.  DIABETIC HISTORY:  Robert Chaney was diagnosed with T2DM Since 2014, started insulin therapy at diagnosis. His hemoglobin A1c has ranged from 6.9%  in 2015, peaking at 13.0%  In 2019.  Linagliptin discontinued in 06/2018 due to cost    Saw Antonieta Iba 08/2018 SUBJECTIVE:   During the last visit (07/16/2019): We continued MDI regimen    Today (11/19/2019): Robert Chaney is here for 16-month follow-up on his diabetes management. He checks his blood sugars 2  times daily, preprandial. The patient has had hypoglycemic episodes since the last clinic visit. On average 1x / month. Otherwise, the patient has not required any recent emergency interventions for hypoglycemia and has not had recent hospitalizations secondary to hyper or hypoglycemic episodes.    ROS: As per HPI and as detailed below: Review of Systems  Constitutional: Negative for fever.  HENT: Negative for congestion and sore throat.   Respiratory: Negative for cough and shortness of breath.   Cardiovascular: Negative for chest pain and palpitations.  Gastrointestinal: Negative for diarrhea and nausea.      HOME DIABETES REGIMEN:   Toujeo 20 units daily - did not take for a month   Novolog 5 units TIDQAC    METER DOWNLOAD SUMMARY: 5/15-5/28/2021 Fingerstick Blood Glucose Tests = 8 Average checks per day = 0.6 Overall Mean FS Glucose = 136  BG Ranges: Low = 68 High = 196   Hypoglycemic Events/30 Days: BG < 50 = 0 Episodes of symptomatic  severe hypoglycemia = 0    DIABETIC COMPLICATIONS: Microvascular complications:   Neuropathy, CKD III, retinopathy on the right   Last eye exam: Completed 05/2019 (S/P right eye retinopathy  Macrovascular complications:    Denies: CAD, PVD, CVA  HISTORY:  Past Medical History:  Past Medical History:  Diagnosis Date  . Altered mental status    a. 05/2017 - adm with blurred vision, somnolence in setting of AKI and high blood sugar.  . Anemia   . CKD (chronic kidney disease), stage III   . Diabetes mellitus   . Diastolic CHF, chronic (HCC)    A.  03/2009 Echo: EF 60-65%, Gr II diast dysfxn  . Elevated troponin    a. 2013 - troponin 1.4. b. 2019 - troponin 0.32; neg nuc 07/2017.  . Glaucoma   . Hyperlipidemia    HYPERCHOLESTEROLEMIA  . Hypertension    MARKED LEFT VENTRICULAR HYPERTROPHY BY PREVIOUS ECHOCARDIOGRAM--HE HAS HYPERDYNAMIC LEFT VENTRICULAR SYSTOLIC FUNCTION AND HAS IMPAIRED RELAXATION BY ECHO  . Hypertrophic cardiomyopathy ([place])   . NSVT (nonsustained ventricular tachycardia) ([place])   . Premature atrial contractions   . PVC's (premature ventricular contractions)   . SVT (supraventricular tachycardia) (HCC)    Past Surgical History:  Past Surgical History:  Procedure Laterality Date  . NO PAST SURGERIES      Social History:  reports that he has never smoked. He has never used smokeless tobacco. He reports that he does not drink alcohol or use drugs. Family History:  Family History  Problem  Relation Age of Onset  . Hypertension Father   . Stroke Father   . Hypertension Mother   . Diabetes Brother   . Hypertension Brother   . Diabetes Brother      HOME MEDICATIONS: Allergies as of 11/19/2019      Reactions   Aspirin Itching   itching      Medication List       Accurate as of Nov 19, 2019  8:00 AM. If you have any questions, ask your nurse or doctor.        amLODipine 10 MG tablet Commonly known as: NORVASC Take 1 tablet (10 mg total) by  mouth at bedtime.   atorvastatin 20 MG tablet Commonly known as: LIPITOR Take 1 tablet (20 mg total) by mouth daily.   brimonidine 0.2 % ophthalmic solution Commonly known as: ALPHAGAN Place 1 drop into the right eye 2 (two) times daily.   carvedilol 25 MG tablet Commonly known as: COREG Take 1 tablet (25 mg total) by mouth 2 (two) times daily.   cholecalciferol 25 MCG (1000 UNIT) tablet Commonly known as: VITAMIN D3 Take 1,000 Units by mouth daily.   cloNIDine 0.1 MG tablet Commonly known as: CATAPRES Take 1 tablet (0.1 mg total) by mouth once for 1 dose.   furosemide 40 MG tablet Commonly known as: LASIX Take 40 mg by mouth as directed. 1/2 TABLET DAILY   hydrALAZINE 100 MG tablet Commonly known as: APRESOLINE Take 1 tablet (100 mg total) by mouth 2 (two) times daily.   Insulin Pen Needle 31G X 5 MM Misc Commonly known as: B-D UF III MINI PEN NEEDLES Four times daily   isosorbide mononitrate 60 MG 24 hr tablet Commonly known as: IMDUR Take 1 tablet (60 mg total) by mouth daily.   latanoprost 0.005 % ophthalmic solution Commonly known as: XALATAN Place 1 drop into the left eye at bedtime.   nitroGLYCERIN 0.4 MG SL tablet Commonly known as: NITROSTAT Place 1 tablet (0.4 mg total) under the tongue every 5 (five) minutes as needed for chest pain.   NovoLOG FlexPen 100 UNIT/ML FlexPen Generic drug: insulin aspart Inject 5 Units into the skin 3 (three) times daily with meals. Max daily 21 units   insulin aspart 100 UNIT/ML injection Commonly known as: novoLOG SMARTSIG:6 Unit(s) SUB-Q 3 Times Daily   OneTouch Verio test strip Generic drug: glucose blood Use as instructed to test blood sugar 3 times daily E11.65   Toujeo Max SoloStar 300 UNIT/ML Sopn Generic drug: Insulin Glargine Inject 20 Units into the skin at bedtime.        OBJECTIVE:   Vital Signs: BP 138/68 (BP Location: Right Arm, Patient Position: Sitting, Cuff Size: Large)   Pulse 86   Temp  97.9 F (36.6 C)   Ht 5\' 8"  (1.727 m)   Wt 194 lb 9.6 oz (88.3 kg)   SpO2 90%   BMI 29.59 kg/m   Wt Readings from Last 3 Encounters:  11/19/19 194 lb 9.6 oz (88.3 kg)  10/06/19 193 lb (87.5 kg)  09/14/19 189 lb (85.7 kg)     Exam: General: Pt appears well and is in NAD  Lungs: Clear with good BS bilat with no rales, rhonchi, or wheezes  Heart: RRR with normal S1 and S2 and no gallops; no murmurs; no rub  Abdomen: Normoactive bowel sounds, soft, nontender, without masses or organomegaly palpable  Extremities: Trace edema   Neuro: MS is good with appropriate affect, pt is alert and Ox3  DM foot exam: 07/16/2019 The skin of the feet is intact without sores or ulcerations. Pt with thickened discolored and curved toe nails The pedal pulses are 1 + on right and 1+ on left. The sensation is intact to a screening 5.07, 10 gram monofilament       DATA REVIEWED:  Lab Results  Component Value Date   HGBA1C 6.0 (A) 11/19/2019   HGBA1C 5.9 (A) 07/16/2019   HGBA1C 5.7 (A) 03/12/2019   Lab Results  Component Value Date   LDLCALC 82 11/07/2016   CREATININE 2.02 (H) 10/29/2019   11/27/2018 Bun 29 Cr 1.74 GFR 45  ASSESSMENT / PLAN / RECOMMENDATIONS:   1) Type 2 diabetes Mellitus, With CKD III, retinopathy, and neuropathic complications - Most recent A1c of 6.0 %. Goal A1c < 7.0 %.     -His A1c is skewed due to CKD  - He has been noted to have a hypoglycemic episode in the past month, pt has not been checking glucose as often, and I have encouraged him to check for safety.  - I am not clear on the circumstances of his hypoglycemic episode and he does not recall details, but I am going to reduce basal insulin as below  - Will apply for [employer] pt assistance program    MEDICATIONS:  Decrease Toujeo to 18 units daily   NovoLog 5 units 3 times daily before every meal   If Pre-meal BG > 200 mg/dL, will add 2 units of Novolog to that meal   EDUCATION / INSTRUCTIONS:  BG  monitoring instructions: Patient is instructed to check his blood sugars 3 times a day, before meals and bedtime.  Call [facility] if: BG persistently < 70 or > 300. . I reviewed the Rule of 15 for the treatment of hypoglycemia in detail with the patient. Literature supplied.    2) Diabetic complications:   Eye: He has diabetic retinopathy. S/P injections to the right eye   Neuro/ Feet: Does have known diabetic peripheral neuropathy.Has an upcoming appointment with podiatry  Renal: Patient does  have known baseline CKD. He is not on an ACEI/ARB at present.     F/U in 4 months   Signed electronically by: Mack Guise, MD  [facility]  [facility] [address] [place], [place] [address] Phone: [phone] FAX: [phone]   CC: [name], [place] [address] Phone: [phone]  Fax: [phone]  Return to Endocrinology clinic as below: Future Appointments  Date Time Provider [place]  11/19/2019  9:00 AM Clydell Hakim, RD [place] [facility]  11/26/2019  8:15 AM [facility] PHARMACIST [facility]

## 2019-11-19 NOTE — Patient Instructions (Addendum)
-   Decrease  Toujeo at 18 units daily - If your pre-meal sugar is less then 200, take 5 units of  Novolog, but if your pre-meal sugar is over 200 take 7 units of Novolog with that meal    HOW TO TREAT LOW BLOOD SUGARS (Blood sugar LESS THAN 70 MG/DL)  Please follow the RULE OF 15 for the treatment of hypoglycemia treatment (when your (blood sugars are less than 70 mg/dL)    STEP 1: Take 15 grams of carbohydrates when your blood sugar is low, which includes:   3-4 GLUCOSE TABS  OR  3-4 OZ OF JUICE OR REGULAR SODA OR  ONE TUBE OF GLUCOSE GEL     STEP 2: RECHECK blood sugar in 15 MINUTES STEP 3: If your blood sugar is still low at the 15 minute recheck --> then, go back to STEP 1 and treat AGAIN with another 15 grams of carbohydrates.

## 2019-11-26 ENCOUNTER — Other Ambulatory Visit: Payer: Self-pay

## 2019-11-26 ENCOUNTER — Ambulatory Visit (INDEPENDENT_AMBULATORY_CARE_PROVIDER_SITE_OTHER): Payer: PPO | Admitting: Pharmacist

## 2019-11-26 VITALS — BP 132/72 | HR 64 | Temp 97.8°F | Ht 68.0 in | Wt 193.2 lb

## 2019-11-26 DIAGNOSIS — I10 Essential (primary) hypertension: Secondary | ICD-10-CM

## 2019-11-26 MED ORDER — AMLODIPINE BESYLATE 5 MG PO TABS: 5.0000 mg | ORAL_TABLET | Freq: Every day | ORAL | 1 refills | Status: DC

## 2019-11-26 MED ORDER — FUROSEMIDE 20 MG PO TABS
ORAL_TABLET | ORAL | 3 refills | Status: DC
Start: 2019-11-26 — End: 2020-01-16

## 2019-11-26 MED ORDER — CLONIDINE HCL 0.1 MG PO TABS
ORAL_TABLET | ORAL | 1 refills | Status: DC
Start: 2019-11-26 — End: 2020-01-11

## 2019-11-26 NOTE — Progress Notes (Signed)
Patient ID: Robert Chaney                 DOB: 06-25-1950                      MRN: [id number]     HPI: Robert Chaney is a 69 y.o. male referred by Dr. Oval Linsey to Advanced Hypertension clinic. PMH includes hypertropic cardiomyopathy, diastolic HF, uncontrolled hypertension, hyperlipidemia, CKD 4, DM, OSA on CPAP. Patein was referred to PREP program and vivify on 10/06/2019, but needs to call back to arrange 1st exercise section and doesn't have an email acount ; therefore, is not able to use Vivify. He feels tired and reports swollen feet.   Current HTN meds:  Amlodipine 10mg  daily (before 10am) Carvedilol 25mg  twice daily (9am & 10pm) Furosemide 20mg  daily  - every day Clonidine 0.1mg  BID started 11/08/2019  Previously tried:  Hydralazine - dizzy Imdur Losartan - worsening renal function  BP goal: <130/80  Family History: The patient's family history includes Diabetes in his brother and brother; Hypertension in his father  Social History: coffee drinker, denies tobacco or alcohol use  Diet: He prepares his meals at home and does not add any salt.    Exercise: activities of daily living  Home BP readings: none provided. ~130 since clonidine started per patient history  Wt Readings from Last 3 Encounters:  11/26/19 193 lb 3.2 oz (87.6 kg)  11/19/19 193 lb (87.5 kg)  11/19/19 194 lb 9.6 oz (88.3 kg)   BP Readings from Last 3 Encounters:  11/26/19 132/72  11/19/19 138/68  10/06/19 (!) 148/78   Pulse Readings from Last 3 Encounters:  11/26/19 64  11/19/19 86  10/06/19 69     Past Medical History:  Diagnosis Date   Altered mental status    a. 05/2017 - adm with blurred vision, somnolence in setting of AKI and high blood sugar.   Anemia    CKD (chronic kidney disease), stage III    Diabetes mellitus    Diastolic CHF, chronic ([place])    A.  03/2009 Echo: EF 60-65%, Gr II diast dysfxn   Elevated troponin    a. 2013 - troponin 1.4. b. 2019 - troponin 0.32; neg nuc  07/2017.   Glaucoma    Hyperlipidemia    HYPERCHOLESTEROLEMIA   Hypertension    MARKED LEFT VENTRICULAR HYPERTROPHY BY PREVIOUS ECHOCARDIOGRAM--HE HAS HYPERDYNAMIC LEFT VENTRICULAR SYSTOLIC FUNCTION AND HAS IMPAIRED RELAXATION BY ECHO   Hypertrophic cardiomyopathy (HCC)    NSVT (nonsustained ventricular tachycardia) (HCC)    Premature atrial contractions    PVC's (premature ventricular contractions)    SVT (supraventricular tachycardia) ([place])     Current Outpatient Medications on File Prior to Visit  Medication Sig Dispense Refill   atorvastatin (LIPITOR) 20 MG tablet Take 1 tablet (20 mg total) by mouth daily. 90 tablet 2   brimonidine (ALPHAGAN) 0.2 % ophthalmic solution Place 1 drop into the right eye 2 (two) times daily.     carvedilol (COREG) 25 MG tablet Take 1 tablet (25 mg total) by mouth 2 (two) times daily. 180 tablet 2   cholecalciferol (VITAMIN D3) 25 MCG (1000 UT) tablet Take 1,000 Units by mouth daily.     glucose blood (ONETOUCH VERIO) test strip Use as instructed to test blood sugar 3 times daily E11.65 100 each 12   hydrALAZINE (APRESOLINE) 100 MG tablet Take 1 tablet (100 mg total) by mouth 2 (two) times daily. 90 tablet 3  insulin aspart (NOVOLOG FLEXPEN) 100 UNIT/ML FlexPen Inject 5 Units into the skin 3 (three) times daily with meals. Max daily 21 units 15 mL 11   insulin aspart (NOVOLOG) 100 UNIT/ML injection SMARTSIG:6 Unit(s) SUB-Q 3 Times Daily     Insulin Glargine (TOUJEO MAX SOLOSTAR) 300 UNIT/ML SOPN Inject 18 Units into the skin at bedtime.     insulin lispro (ADMELOG SOLOSTAR) 100 UNIT/ML KwikPen Inject 0.05 mLs (5 Units total) into the skin 3 (three) times daily. 15 mL 11   Insulin Pen Needle (B-D UF III MINI PEN NEEDLES) 31G X 5 MM MISC Four times daily 150 each 6   isosorbide mononitrate (IMDUR) 60 MG 24 hr tablet Take 1 tablet (60 mg total) by mouth daily. 90 tablet 2   latanoprost (XALATAN) 0.005 % ophthalmic solution Place 1 drop  into the left eye at bedtime.     nitroGLYCERIN (NITROSTAT) 0.4 MG SL tablet Place 1 tablet (0.4 mg total) under the tongue every 5 (five) minutes as needed for chest pain. 3 tablet 2   No current facility-administered medications on file prior to visit.    Allergies  Allergen Reactions   Aspirin Itching    itching    Blood pressure 132/72, pulse 64, temperature 97.8 F (36.6 C), height 5\' 8"  (1.727 m), weight 193 lb 3.2 oz (87.6 kg), SpO2 90 %.  Essential hypertension Blood pressure is greatly improved, but patient reports increased LEE and increased fatigue. He is very happy with BP drop obtained with clonidine 0.1mg  and will like to continue on this medication. Will decrease amlodipine to 5mg  daily, and increased furosemide to 40mg  daily for 3 days, then back to 2omg daily to alleviate LEE. Will increase clonidine to 0.1mg  every morning and 0.2mg  every evening to compensate for lowering amlodipine dose. Plan to follow up in 4 weeks and continue to titrate medication as needed.   [place] Wherry Rodriguez-Guzman PharmD, BCPS, [place] [address] 11/29/2019 3:40 PM

## 2019-11-26 NOTE — Patient Instructions (Addendum)
Return for a  follow up appointment in 4 weeks  Check your blood pressure at home daily (if able) and keep record of the readings.  Take your BP meds as follows: * DECREASE amlodipine dose to 5mg  daily* * TAKE furosemide 40mg ( 2 tablets of 20mg  each) for 3 days , then resume furosemide 20mg  daily* * Increase clonidine to 0.1mg  every morning and 0.2mg  every evening*  Bring all of your meds, your BP cuff and your record of home blood pressures to your next appointment.  Exercise as you're able, try to walk approximately 30 minutes per day.  Keep salt intake to a minimum, especially watch canned and prepared boxed foods.  Eat more fresh fruits and vegetables and fewer canned items.  Avoid eating in fast food restaurants.    HOW TO TAKE YOUR BLOOD PRESSURE: . Rest 5 minutes before taking your blood pressure. .  Don't smoke or drink caffeinated beverages for at least 30 minutes before. . Take your blood pressure before (not after) you eat. . Sit comfortably with your back supported and both feet on the floor (don't cross your legs). . Elevate your arm to heart level on a table or a desk. . Use the proper sized cuff. It should fit smoothly and snugly around your bare upper arm. There should be enough room to slip a fingertip under the cuff. The bottom edge of the cuff should be 1 inch above the crease of the elbow. . Ideally, take 3 measurements at one sitting and record the average.

## 2019-11-29 ENCOUNTER — Encounter: Payer: Self-pay | Admitting: Pharmacist

## 2019-11-29 DIAGNOSIS — E1122 Type 2 diabetes mellitus with diabetic chronic kidney disease: Secondary | ICD-10-CM | POA: Diagnosis not present

## 2019-11-29 DIAGNOSIS — I509 Heart failure, unspecified: Secondary | ICD-10-CM | POA: Diagnosis not present

## 2019-11-29 DIAGNOSIS — I10 Essential (primary) hypertension: Secondary | ICD-10-CM | POA: Diagnosis not present

## 2019-11-29 NOTE — Assessment & Plan Note (Signed)
Blood pressure is greatly improved, but patient reports increased LEE and increased fatigue. He is very happy with BP drop obtained with clonidine 0.1mg  and will like to continue on this medication. Will decrease amlodipine to 5mg  daily, and increased furosemide to 40mg  daily for 3 days, then back to 2omg daily to alleviate LEE. Will increase clonidine to 0.1mg  every morning and 0.2mg  every evening to compensate for lowering amlodipine dose. Plan to follow up in 4 weeks and continue to titrate medication as needed.

## 2019-12-21 ENCOUNTER — Telehealth: Payer: Self-pay

## 2019-12-21 NOTE — Telephone Encounter (Signed)
Left message reference evening PREP class starting 7/20. Does he wish to participate?

## 2020-01-03 ENCOUNTER — Other Ambulatory Visit: Payer: Self-pay

## 2020-01-03 ENCOUNTER — Encounter (INDEPENDENT_AMBULATORY_CARE_PROVIDER_SITE_OTHER): Payer: Self-pay

## 2020-01-03 ENCOUNTER — Ambulatory Visit (INDEPENDENT_AMBULATORY_CARE_PROVIDER_SITE_OTHER): Payer: PPO | Admitting: Pharmacist Clinician (PhC)/ Clinical Pharmacy Specialist

## 2020-01-03 DIAGNOSIS — I10 Essential (primary) hypertension: Secondary | ICD-10-CM | POA: Diagnosis not present

## 2020-01-03 NOTE — Progress Notes (Signed)
Patient ID: Robert Chaney                 DOB: 1951/03/13                      MRN: [id number]     HPI: Robert Chaney is a 69 y.o. male referred by Dr. Oval Linsey to Advanced Hypertension clinic. PMH includes hypertropic cardiomyopathy, diastolic HF, uncontrolled hypertension, hyperlipidemia, CKD 4, DM, OSA on CPAP. Patein was referred to PREP program and vivify on 10/06/2019, but needs to call back to arrange 1st exercise section and doesn't have an email acount ; therefore, is not able to use Vivify. He feels tired and reports swollen feet.   At his last visit with Raquel Rodriguez-Guzman patient noted increase in LEE and fatigue, although BP was good.  Amlodipine was decreased from 10 to 5 mg daily and clonidine to 0.1 mg am/0.2 mg hs.  He returns today for follow up.  He has been doing well and is excited to start PREP classes this month.    Start PREP  Evening class this month -  Current HTN meds:  Amlodipine 5 mg daily (before 10am) Carvedilol 25 mg twice daily (9am & 10pm) Furosemide 20mg  daily  - every day Clonidine 0.1mg  AM/ 0.2 PM  Previously tried:  Hydralazine - dizzy - was at 100 mg dose Imdur Losartan - worsening renal function  BP goal: <130/80  Family History: The patient's family history includes Diabetes in his brother and brother; Hypertension in his father  Social History: coffee drinker, denies tobacco or alcohol use  Diet: He prepares his meals at home and does not add any salt.    Exercise: activities of daily living - will start PREP classes in the evenings this month  Home BP readings: 30 readings in the past month - mix of morning and evening  Average 993 systolic.  Diastolic readings and HR all WNL  Wt Readings from Last 3 Encounters:  01/03/20 185 lb 3.2 oz (84 kg)  11/26/19 193 lb 3.2 oz (87.6 kg)  11/19/19 193 lb (87.5 kg)   BP Readings from Last 3 Encounters:  01/03/20 (!) 210/102  11/26/19 132/72  11/19/19 138/68   Pulse Readings from Last 3  Encounters:  01/03/20 66  11/26/19 64  11/19/19 86     Past Medical History:  Diagnosis Date  . Altered mental status    a. 05/2017 - adm with blurred vision, somnolence in setting of AKI and high blood sugar.  . Anemia   . CKD (chronic kidney disease), stage III   . Diabetes mellitus   . Diastolic CHF, chronic (HCC)    A.  03/2009 Echo: EF 60-65%, Gr II diast dysfxn  . Elevated troponin    a. 2013 - troponin 1.4. b. 2019 - troponin 0.32; neg nuc 07/2017.  . Glaucoma   . Hyperlipidemia    HYPERCHOLESTEROLEMIA  . Hypertension    MARKED LEFT VENTRICULAR HYPERTROPHY BY PREVIOUS ECHOCARDIOGRAM--HE HAS HYPERDYNAMIC LEFT VENTRICULAR SYSTOLIC FUNCTION AND HAS IMPAIRED RELAXATION BY ECHO  . Hypertrophic cardiomyopathy ([place])   . NSVT (nonsustained ventricular tachycardia) ([place])   . Premature atrial contractions   . PVC's (premature ventricular contractions)   . SVT (supraventricular tachycardia) ([place])     Current Outpatient Medications on File Prior to Visit  Medication Sig Dispense Refill  . amLODipine (NORVASC) 5 MG tablet Take 1 tablet (5 mg total) by mouth at bedtime. 30 tablet 1  . atorvastatin (  LIPITOR) 20 MG tablet Take 1 tablet (20 mg total) by mouth daily. 90 tablet 2  . brimonidine (ALPHAGAN) 0.2 % ophthalmic solution Place 1 drop into the right eye 2 (two) times daily.    . carvedilol (COREG) 25 MG tablet Take 1 tablet (25 mg total) by mouth 2 (two) times daily. 180 tablet 2  . cholecalciferol (VITAMIN D3) 25 MCG (1000 UT) tablet Take 1,000 Units by mouth daily.    . cloNIDine (CATAPRES) 0.1 MG tablet Take 1 tablet (0.1 mg total) by mouth every morning AND 2 tablets (0.2 mg total) every evening. 90 tablet 1  . furosemide (LASIX) 20 MG tablet Take 40mg  daily for 3 days (June/5 to June/8), then continue taking 20mg  daily. 90 tablet 3  . glucose blood (ONETOUCH VERIO) test strip Use as instructed to test blood sugar 3 times daily E11.65 100 each 12  . insulin aspart (NOVOLOG)  100 UNIT/ML injection SMARTSIG:6 Unit(s) SUB-Q 3 Times Daily    . Insulin Glargine (TOUJEO MAX SOLOSTAR) 300 UNIT/ML SOPN Inject 18 Units into the skin at bedtime.    . Insulin Pen Needle (B-D UF III MINI PEN NEEDLES) 31G X 5 MM MISC Four times daily 150 each 6  . isosorbide mononitrate (IMDUR) 60 MG 24 hr tablet Take 1 tablet (60 mg total) by mouth daily. 90 tablet 2  . latanoprost (XALATAN) 0.005 % ophthalmic solution Place 1 drop into the left eye at bedtime.    . hydrALAZINE (APRESOLINE) 100 MG tablet Take 1 tablet (100 mg total) by mouth 2 (two) times daily. 90 tablet 3  . nitroGLYCERIN (NITROSTAT) 0.4 MG SL tablet Place 1 tablet (0.4 mg total) under the tongue every 5 (five) minutes as needed for chest pain. (Patient not taking: Reported on 01/03/2020) 3 tablet 2   No current facility-administered medications on file prior to visit.    Allergies  Allergen Reactions  . Aspirin Itching    itching    Blood pressure (!) 210/102, pulse 66, height 5\' 8"  (1.727 m), weight 185 lb 3.2 oz (84 kg), SpO2 93 %.  Essential hypertension Patient with systolic hypertension, has noted pressure does well after taking clonidine dose, but rises before next dose.  Will have him increase dose to 0.1 mg mornings and afternoon, and continue with 0.2 mg each evening.  He should continue to monitor BP readings at home and will return in 4-6 weeks for follow up.    Tommy Medal PharmD CPP [facility] [address] [place] [address] 01/04/2020 8:55 AM

## 2020-01-03 NOTE — Patient Instructions (Signed)
  Check your blood pressure at home daily (if able) and keep record of the readings.  Take your BP meds as follows:  Increase clonidine to 4 tablets per day - 1 in AM, 1 at 4-5 PM and 2 at bedtime  Continue with all other medications  Bring all of your meds, your BP cuff and your record of home blood pressures to your next appointment.  Exercise as you're able, try to walk approximately 30 minutes per day.  Keep salt intake to a minimum, especially watch canned and prepared boxed foods.  Eat more fresh fruits and vegetables and fewer canned items.  Avoid eating in fast food restaurants.    HOW TO TAKE YOUR BLOOD PRESSURE: . Rest 5 minutes before taking your blood pressure. .  Don't smoke or drink caffeinated beverages for at least 30 minutes before. . Take your blood pressure before (not after) you eat. . Sit comfortably with your back supported and both feet on the floor (don't cross your legs). . Elevate your arm to heart level on a table or a desk. . Use the proper sized cuff. It should fit smoothly and snugly around your bare upper arm. There should be enough room to slip a fingertip under the cuff. The bottom edge of the cuff should be 1 inch above the crease of the elbow. . Ideally, take 3 measurements at one sitting and record the average.

## 2020-01-04 ENCOUNTER — Encounter: Payer: Self-pay | Admitting: Pharmacist Clinician (PhC)/ Clinical Pharmacy Specialist

## 2020-01-04 NOTE — Assessment & Plan Note (Signed)
Patient with systolic hypertension, has noted pressure does well after taking clonidine dose, but rises before next dose.  Will have him increase dose to 0.1 mg mornings and afternoon, and continue with 0.2 mg each evening.  He should continue to monitor BP readings at home and will return in 4-6 weeks for follow up.

## 2020-01-11 ENCOUNTER — Inpatient Hospital Stay (HOSPITAL_COMMUNITY): Payer: PPO

## 2020-01-11 ENCOUNTER — Emergency Department (HOSPITAL_COMMUNITY): Payer: PPO

## 2020-01-11 ENCOUNTER — Inpatient Hospital Stay (HOSPITAL_COMMUNITY)
Admission: EM | Admit: 2020-01-11 | Discharge: 2020-01-16 | DRG: 291 | Disposition: A | Discharge status: Home / Self Care | Payer: PPO | Attending: Internal Medicine | Admitting: Internal Medicine

## 2020-01-11 ENCOUNTER — Other Ambulatory Visit: Payer: Self-pay

## 2020-01-11 DIAGNOSIS — R0789 Other chest pain: Secondary | ICD-10-CM | POA: Diagnosis not present

## 2020-01-11 DIAGNOSIS — I422 Other hypertrophic cardiomyopathy: Secondary | ICD-10-CM | POA: Diagnosis not present

## 2020-01-11 DIAGNOSIS — I130 Hypertensive heart and chronic kidney disease with heart failure and stage 1 through stage 4 chronic kidney disease, or unspecified chronic kidney disease: Secondary | ICD-10-CM | POA: Diagnosis not present

## 2020-01-11 DIAGNOSIS — D631 Anemia in chronic kidney disease: Secondary | ICD-10-CM | POA: Diagnosis present

## 2020-01-11 DIAGNOSIS — H409 Unspecified glaucoma: Secondary | ICD-10-CM | POA: Diagnosis not present

## 2020-01-11 DIAGNOSIS — N1832 Chronic kidney disease, stage 3b: Secondary | ICD-10-CM | POA: Diagnosis not present

## 2020-01-11 DIAGNOSIS — J9601 Acute respiratory failure with hypoxia: Secondary | ICD-10-CM | POA: Diagnosis not present

## 2020-01-11 DIAGNOSIS — E1165 Type 2 diabetes mellitus with hyperglycemia: Secondary | ICD-10-CM | POA: Diagnosis not present

## 2020-01-11 DIAGNOSIS — I517 Cardiomegaly: Secondary | ICD-10-CM | POA: Diagnosis not present

## 2020-01-11 DIAGNOSIS — R079 Chest pain, unspecified: Secondary | ICD-10-CM | POA: Diagnosis not present

## 2020-01-11 DIAGNOSIS — Z833 Family history of diabetes mellitus: Secondary | ICD-10-CM | POA: Diagnosis not present

## 2020-01-11 DIAGNOSIS — Z886 Allergy status to analgesic agent status: Secondary | ICD-10-CM | POA: Diagnosis not present

## 2020-01-11 DIAGNOSIS — R0600 Dyspnea, unspecified: Secondary | ICD-10-CM

## 2020-01-11 DIAGNOSIS — Z794 Long term (current) use of insulin: Secondary | ICD-10-CM | POA: Diagnosis not present

## 2020-01-11 DIAGNOSIS — E1121 Type 2 diabetes mellitus with diabetic nephropathy: Secondary | ICD-10-CM | POA: Diagnosis not present

## 2020-01-11 DIAGNOSIS — E11319 Type 2 diabetes mellitus with unspecified diabetic retinopathy without macular edema: Secondary | ICD-10-CM | POA: Diagnosis not present

## 2020-01-11 DIAGNOSIS — R9431 Abnormal electrocardiogram [ECG] [EKG]: Secondary | ICD-10-CM

## 2020-01-11 DIAGNOSIS — R069 Unspecified abnormalities of breathing: Secondary | ICD-10-CM | POA: Diagnosis not present

## 2020-01-11 DIAGNOSIS — I421 Obstructive hypertrophic cardiomyopathy: Secondary | ICD-10-CM

## 2020-01-11 DIAGNOSIS — I5023 Acute on chronic systolic (congestive) heart failure: Secondary | ICD-10-CM | POA: Diagnosis not present

## 2020-01-11 DIAGNOSIS — I509 Heart failure, unspecified: Secondary | ICD-10-CM | POA: Diagnosis present

## 2020-01-11 DIAGNOSIS — J810 Acute pulmonary edema: Secondary | ICD-10-CM | POA: Diagnosis not present

## 2020-01-11 DIAGNOSIS — I5033 Acute on chronic diastolic (congestive) heart failure: Secondary | ICD-10-CM | POA: Diagnosis present

## 2020-01-11 DIAGNOSIS — N1831 Chronic kidney disease, stage 3a: Secondary | ICD-10-CM | POA: Diagnosis not present

## 2020-01-11 DIAGNOSIS — R0902 Hypoxemia: Secondary | ICD-10-CM

## 2020-01-11 DIAGNOSIS — J9600 Acute respiratory failure, unspecified whether with hypoxia or hypercapnia: Secondary | ICD-10-CM

## 2020-01-11 DIAGNOSIS — Z20822 Contact with and (suspected) exposure to covid-19: Secondary | ICD-10-CM | POA: Diagnosis not present

## 2020-01-11 DIAGNOSIS — I248 Other forms of acute ischemic heart disease: Secondary | ICD-10-CM | POA: Diagnosis not present

## 2020-01-11 DIAGNOSIS — Z79899 Other long term (current) drug therapy: Secondary | ICD-10-CM

## 2020-01-11 DIAGNOSIS — R0602 Shortness of breath: Secondary | ICD-10-CM | POA: Diagnosis not present

## 2020-01-11 DIAGNOSIS — Z9119 Patient's noncompliance with other medical treatment and regimen: Secondary | ICD-10-CM | POA: Diagnosis not present

## 2020-01-11 DIAGNOSIS — I5031 Acute diastolic (congestive) heart failure: Secondary | ICD-10-CM | POA: Diagnosis not present

## 2020-01-11 DIAGNOSIS — I161 Hypertensive emergency: Secondary | ICD-10-CM

## 2020-01-11 DIAGNOSIS — R739 Hyperglycemia, unspecified: Secondary | ICD-10-CM | POA: Diagnosis present

## 2020-01-11 DIAGNOSIS — E785 Hyperlipidemia, unspecified: Secondary | ICD-10-CM | POA: Diagnosis not present

## 2020-01-11 DIAGNOSIS — G4733 Obstructive sleep apnea (adult) (pediatric): Secondary | ICD-10-CM | POA: Diagnosis present

## 2020-01-11 DIAGNOSIS — E1122 Type 2 diabetes mellitus with diabetic chronic kidney disease: Secondary | ICD-10-CM

## 2020-01-11 DIAGNOSIS — I872 Venous insufficiency (chronic) (peripheral): Secondary | ICD-10-CM | POA: Diagnosis not present

## 2020-01-11 DIAGNOSIS — I5032 Chronic diastolic (congestive) heart failure: Secondary | ICD-10-CM | POA: Diagnosis not present

## 2020-01-11 DIAGNOSIS — J811 Chronic pulmonary edema: Secondary | ICD-10-CM | POA: Diagnosis not present

## 2020-01-11 DIAGNOSIS — I110 Hypertensive heart disease with heart failure: Secondary | ICD-10-CM | POA: Diagnosis not present

## 2020-01-11 DIAGNOSIS — J90 Pleural effusion, not elsewhere classified: Secondary | ICD-10-CM | POA: Diagnosis not present

## 2020-01-11 DIAGNOSIS — N184 Chronic kidney disease, stage 4 (severe): Secondary | ICD-10-CM

## 2020-01-11 LAB — ECHOCARDIOGRAM COMPLETE
Area-P 1/2: 2.16 cm2
Height: 68 in
S' Lateral: 3.1 cm
Weight: 2962.98 oz

## 2020-01-11 LAB — COMPREHENSIVE METABOLIC PANEL
ALT: 33 U/L (ref 0–44)
AST: 56 U/L — ABNORMAL HIGH (ref 15–41)
Albumin: 3.1 g/dL — ABNORMAL LOW (ref 3.5–5.0)
Alkaline Phosphatase: 102 U/L (ref 38–126)
Anion gap: 5 (ref 5–15)
BUN: 34 mg/dL — ABNORMAL HIGH (ref 8–23)
CO2: 24 mmol/L (ref 22–32)
Calcium: 8.9 mg/dL (ref 8.9–10.3)
Chloride: 108 mmol/L (ref 98–111)
Creatinine, Ser: 2.56 mg/dL — ABNORMAL HIGH (ref 0.61–1.24)
GFR calc Af Amer: 28 mL/min — ABNORMAL LOW (ref >60)
GFR calc non Af Amer: 24 mL/min — ABNORMAL LOW (ref >60)
Glucose, Bld: 236 mg/dL — ABNORMAL HIGH (ref 70–99)
Potassium: 4.6 mmol/L (ref 3.5–5.1)
Sodium: 137 mmol/L (ref 135–145)
Total Bilirubin: 0.8 mg/dL (ref 0.3–1.2)
Total Protein: 7.4 g/dL (ref 6.5–8.1)

## 2020-01-11 LAB — TROPONIN I (HIGH SENSITIVITY)
Troponin I (High Sensitivity): 63 ng/L — ABNORMAL HIGH (ref <18)
Troponin I (High Sensitivity): 61 ng/L — ABNORMAL HIGH (ref <18)

## 2020-01-11 LAB — CBC WITH DIFFERENTIAL/PLATELET
Abs Immature Granulocytes: 0.03 10*3/uL (ref 0.00–0.07)
Basophils Absolute: 0.1 10*3/uL (ref 0.0–0.1)
Basophils Relative: 1 %
Eosinophils Absolute: 0.2 10*3/uL (ref 0.0–0.5)
Eosinophils Relative: 2 %
HCT: 37.7 % — ABNORMAL LOW (ref 39.0–52.0)
Hemoglobin: 11.4 g/dL — ABNORMAL LOW (ref 13.0–17.0)
Immature Granulocytes: 0 %
Lymphocytes Relative: 18 %
Lymphs Abs: 1.5 10*3/uL (ref 0.7–4.0)
MCH: 31.6 pg (ref 26.0–34.0)
MCHC: 30.2 g/dL (ref 30.0–36.0)
MCV: 104.4 fL — ABNORMAL HIGH (ref 80.0–100.0)
Monocytes Absolute: 0.5 10*3/uL (ref 0.1–1.0)
Monocytes Relative: 7 %
Neutro Abs: 5.7 10*3/uL (ref 1.7–7.7)
Neutrophils Relative %: 72 %
Platelets: 216 10*3/uL (ref 150–400)
RBC: 3.61 MIL/uL — ABNORMAL LOW (ref 4.22–5.81)
RDW: 13.3 % (ref 11.5–15.5)
WBC: 7.9 10*3/uL (ref 4.0–10.5)
nRBC: 0 % (ref 0.0–0.2)

## 2020-01-11 LAB — CBG MONITORING, ED
Glucose-Capillary: 169 mg/dL — ABNORMAL HIGH (ref 70–99)
Glucose-Capillary: 154 mg/dL — ABNORMAL HIGH (ref 70–99)

## 2020-01-11 LAB — BRAIN NATRIURETIC PEPTIDE: B Natriuretic Peptide: 628.4 pg/mL — ABNORMAL HIGH (ref 0.0–100.0)

## 2020-01-11 LAB — MAGNESIUM: Magnesium: 1.9 mg/dL (ref 1.7–2.4)

## 2020-01-11 LAB — SARS CORONAVIRUS 2 BY RT PCR (HOSPITAL ORDER, PERFORMED IN ~~LOC~~ HOSPITAL LAB): SARS Coronavirus 2: NEGATIVE

## 2020-01-11 LAB — GLUCOSE, CAPILLARY: Glucose-Capillary: 142 mg/dL — ABNORMAL HIGH (ref 70–99)

## 2020-01-11 MED ORDER — CLONIDINE HCL 0.2 MG/24HR TD PTWK
0.2000 mg | MEDICATED_PATCH | TRANSDERMAL | Status: DC
  Administered 2020-01-11: 0.2 mg via TRANSDERMAL
  Filled 2020-01-11: qty 1

## 2020-01-11 MED ORDER — INSULIN ASPART 100 UNIT/ML ~~LOC~~ SOLN
0.0000 [IU] | Freq: Three times a day (TID) | SUBCUTANEOUS | Status: DC
  Administered 2020-01-11 – 2020-01-12 (×3): 2 [IU] via SUBCUTANEOUS
  Administered 2020-01-12 – 2020-01-13 (×2): 1 [IU] via SUBCUTANEOUS
  Administered 2020-01-13: 3 [IU] via SUBCUTANEOUS
  Administered 2020-01-14: 2 [IU] via SUBCUTANEOUS
  Administered 2020-01-15: 1 [IU] via SUBCUTANEOUS
  Administered 2020-01-15: 2 [IU] via SUBCUTANEOUS

## 2020-01-11 MED ORDER — NITROGLYCERIN IN D5W 200-5 MCG/ML-% IV SOLN
0.0000 ug/min | INTRAVENOUS | Status: DC
  Administered 2020-01-11: 200 ug/min via INTRAVENOUS
  Administered 2020-01-11: 300 ug/min via INTRAVENOUS
  Administered 2020-01-11: 400 ug/min via INTRAVENOUS
  Filled 2020-01-11 (×3): qty 250

## 2020-01-11 MED ORDER — NITROGLYCERIN IN D5W 200-5 MCG/ML-% IV SOLN
INTRAVENOUS | Status: AC
  Administered 2020-01-11: 100 ug/min via INTRAVENOUS
  Filled 2020-01-11: qty 250

## 2020-01-11 MED ORDER — FUROSEMIDE 10 MG/ML IJ SOLN
40.0000 mg | Freq: Two times a day (BID) | INTRAMUSCULAR | Status: DC
  Administered 2020-01-11 – 2020-01-14 (×7): 40 mg via INTRAVENOUS
  Filled 2020-01-11 (×7): qty 4

## 2020-01-11 MED ORDER — ISOSORBIDE MONONITRATE ER 60 MG PO TB24
60.0000 mg | ORAL_TABLET | Freq: Every day | ORAL | Status: DC
  Administered 2020-01-11 – 2020-01-16 (×6): 60 mg via ORAL
  Filled 2020-01-11 (×5): qty 1
  Filled 2020-01-11: qty 2

## 2020-01-11 MED ORDER — FUROSEMIDE 10 MG/ML IJ SOLN
80.0000 mg | Freq: Once | INTRAMUSCULAR | Status: AC
  Administered 2020-01-11: 80 mg via INTRAVENOUS
  Filled 2020-01-11: qty 8

## 2020-01-11 MED ORDER — CLONIDINE HCL 0.1 MG PO TABS
0.1000 mg | ORAL_TABLET | Freq: Three times a day (TID) | ORAL | Status: DC | PRN
  Administered 2020-01-11: 0.1 mg via ORAL
  Filled 2020-01-11: qty 1

## 2020-01-11 MED ORDER — INSULIN ASPART 100 UNIT/ML ~~LOC~~ SOLN: 4.0000 [IU] | Freq: Three times a day (TID) | SUBCUTANEOUS | Status: DC

## 2020-01-11 MED ORDER — BRIMONIDINE TARTRATE 0.2 % OP SOLN
1.0000 [drp] | Freq: Two times a day (BID) | OPHTHALMIC | Status: DC
  Administered 2020-01-12 – 2020-01-16 (×9): 1 [drp] via OPHTHALMIC
  Filled 2020-01-11 (×3): qty 5

## 2020-01-11 MED ORDER — CARVEDILOL 25 MG PO TABS
25.0000 mg | ORAL_TABLET | Freq: Two times a day (BID) | ORAL | Status: DC
  Administered 2020-01-11 – 2020-01-16 (×11): 25 mg via ORAL
  Filled 2020-01-11 (×7): qty 1
  Filled 2020-01-11: qty 2
  Filled 2020-01-11 (×5): qty 1

## 2020-01-11 MED ORDER — ATORVASTATIN CALCIUM 10 MG PO TABS
20.0000 mg | ORAL_TABLET | Freq: Every day | ORAL | Status: DC
  Administered 2020-01-11 – 2020-01-16 (×6): 20 mg via ORAL
  Filled 2020-01-11 (×6): qty 2

## 2020-01-11 MED ORDER — ENOXAPARIN SODIUM 30 MG/0.3ML ~~LOC~~ SOLN
30.0000 mg | SUBCUTANEOUS | Status: DC
  Administered 2020-01-11 – 2020-01-16 (×6): 30 mg via SUBCUTANEOUS
  Filled 2020-01-11 (×5): qty 0.3

## 2020-01-11 MED ORDER — VITAMIN D 25 MCG (1000 UNIT) PO TABS
1000.0000 [IU] | ORAL_TABLET | Freq: Every day | ORAL | Status: DC
  Administered 2020-01-11 – 2020-01-16 (×6): 1000 [IU] via ORAL
  Filled 2020-01-11 (×6): qty 1

## 2020-01-11 MED ORDER — INSULIN ASPART 100 UNIT/ML ~~LOC~~ SOLN: 0.0000 [IU] | Freq: Every day | SUBCUTANEOUS | Status: DC

## 2020-01-11 MED ORDER — HYDRALAZINE HCL 50 MG PO TABS
100.0000 mg | ORAL_TABLET | Freq: Two times a day (BID) | ORAL | Status: DC
  Administered 2020-01-11 – 2020-01-16 (×11): 100 mg via ORAL
  Filled 2020-01-11 (×11): qty 2

## 2020-01-11 MED ORDER — INSULIN GLARGINE 100 UNIT/ML ~~LOC~~ SOLN
18.0000 [IU] | Freq: Every day | SUBCUTANEOUS | Status: DC
  Administered 2020-01-12: 18 [IU] via SUBCUTANEOUS
  Filled 2020-01-11 (×3): qty 0.18

## 2020-01-11 MED ORDER — LATANOPROST 0.005 % OP SOLN
1.0000 [drp] | Freq: Every day | OPHTHALMIC | Status: DC
  Administered 2020-01-12 – 2020-01-15 (×4): 1 [drp] via OPHTHALMIC
  Filled 2020-01-11 (×2): qty 2.5

## 2020-01-11 NOTE — ED Notes (Signed)
Pt SPO2 99% on 5 lpm O2 via [place], pt weaned down to 4 lpm O2 via  and SPO2 97%

## 2020-01-11 NOTE — ED Notes (Signed)
Lunch Tray Ordered @ [id number].

## 2020-01-11 NOTE — Progress Notes (Signed)
  Echocardiogram 2D Echocardiogram has been performed.  Robert Chaney 01/11/2020, 2:05 PM

## 2020-01-11 NOTE — ED Notes (Signed)
Pt's contact updated via telephone as requested

## 2020-01-11 NOTE — H&P (Addendum)
History and Physical    DOA: 01/11/2020  PCP: [name], MD  Patient coming from: home  Chief Complaint: dyspnea  HPI: Robert Chaney is a 69 y.o. male with history h/o hypertension, hyperlipidemia, hypertrophic cardiomyopathy, NSVT/PSVT, insulin-dependent diabetes mellitus type 2, CKD stage III, chronic anemia, sleep apnea on CPAP nightly, presented to ED with complaints of dyspnea, lower extremity edema and chest pain.  Patient states he was up till 2 AM last night when he developed retrosternal chest discomfort, pressure sensation, 5/10 associated with progressive dyspnea.  He states he checked pulse ox with his home monitor and was reading 60%.  On EMS arrival patient was noted to be hypertensive with blood pressure 215/110, heart rate 85, labored breathing, hypoxic with O2 sats 77% on room air and blood glucose of 251.  CPAP was applied with improved sats to 96%.  Patient reports noncompliance with CPAP at night (states ends up using about 4-5 times a week, feels his mask is not working) but claims compliance with blood pressure medications.  He reports worsening leg swellings over the last month.  A week and a half back he was evaluated by PCP and prescribed extra diuretic medication (he does not know the name but states was a small pill at a small dose that he was taking 1 tab twice daily), when he did not have any significant improvement with the new diuretic, he called PCP office again 2 days back when he was told to double up the dosage.  Patient states his leg swellings did improve somewhat but yesterday he felt he was swollen again.  ED course: Afebrile, temp 98.7, heart rate 65-79, respiratory rate 27 on arrival-now improved to 17, BP 220/96 on arrival-now improved to 166/76 with nitroglycerin drip..  Patient placed on BiPAP in the ED with 40% FiO2-currently still on BiPAP and saturating 99%.  Patient received 80 mg of Lasix IV x1.  Lab work showed sodium 137, potassium 4.6, bicarb 24,  BUN 34, creatinine 2.56 (baseline appears to be around 2.0 -2.7), LFTs normal, albumin 3.1, BG 236, WBC 7.9, hemoglobin 11.4, MCV 104, platelet 216.  Patient requested to be admitted for further evaluation and management.  Requested ED physician to start patient on clonidine patch and titrate off nitrates.    Review of Systems: As per HPI otherwise 10 point review of systems negative.    Past Medical History:  Diagnosis Date  . Altered mental status    a. 05/2017 - adm with blurred vision, somnolence in setting of AKI and high blood sugar.  . Anemia   . CKD (chronic kidney disease), stage III   . Diabetes mellitus   . Diastolic CHF, chronic (HCC)    A.  03/2009 Echo: EF 60-65%, Gr II diast dysfxn  . Elevated troponin    a. 2013 - troponin 1.4. b. 2019 - troponin 0.32; neg nuc 07/2017.  . Glaucoma   . Hyperlipidemia    HYPERCHOLESTEROLEMIA  . Hypertension    MARKED LEFT VENTRICULAR HYPERTROPHY BY PREVIOUS ECHOCARDIOGRAM--HE HAS HYPERDYNAMIC LEFT VENTRICULAR SYSTOLIC FUNCTION AND HAS IMPAIRED RELAXATION BY ECHO  . Hypertrophic cardiomyopathy ([place])   . NSVT (nonsustained ventricular tachycardia) ([place])   . Premature atrial contractions   . PVC's (premature ventricular contractions)   . SVT (supraventricular tachycardia) (HCC)     Past Surgical History:  Procedure Laterality Date  . NO PAST SURGERIES      Social history:  reports that he has never smoked. He has never used smokeless tobacco.  He reports that he does not drink alcohol and does not use drugs.   Allergies  Allergen Reactions  . Aspirin Itching    Family History  Problem Relation Age of Onset  . Hypertension Father   . Stroke Father   . Hypertension Mother   . Diabetes Brother   . Hypertension Brother   . Diabetes Brother       Prior to Admission medications   Medication Sig Start Date End Date Taking? Authorizing Provider  amLODipine (NORVASC) 5 MG tablet Take 1 tablet (5 mg total) by mouth at bedtime.  11/26/19  Yes Skeet Latch, MD  atorvastatin (LIPITOR) 20 MG tablet Take 1 tablet (20 mg total) by mouth daily. 03/16/18  Yes Dorothy Spark, MD  brimonidine (ALPHAGAN) 0.2 % ophthalmic solution Place 1 drop into the right eye 2 (two) times daily. 10/06/19  Yes [provider]  carvedilol (COREG) 25 MG tablet Take 1 tablet (25 mg total) by mouth 2 (two) times daily. 10/06/19  Yes Skeet Latch, MD  cholecalciferol (VITAMIN D3) 25 MCG (1000 UT) tablet Take 1,000 Units by mouth daily.   Yes [provider]  cloNIDine (CATAPRES) 0.1 MG tablet Take 0.1 mg by mouth 3 (three) times daily.   Yes [provider]  furosemide (LASIX) 20 MG tablet Take 40mg  daily for 3 days (June/5 to June/8), then continue taking 20mg  daily. Patient taking differently: Take 20 mg by mouth daily.  11/26/19  Yes Skeet Latch, MD  hydrALAZINE (APRESOLINE) 100 MG tablet Take 1 tablet (100 mg total) by mouth 2 (two) times daily. 09/14/19  Yes Imogene Burn, PA-C  insulin aspart (NOVOLOG) 100 UNIT/ML injection Inject 0-6 Units into the skin 3 (three) times daily with meals. Sliding scale 09/02/19  Yes [provider]  Insulin Glargine (TOUJEO MAX SOLOSTAR) 300 UNIT/ML SOPN Inject 18 Units into the skin at bedtime.   Yes [provider]  isosorbide mononitrate (IMDUR) 60 MG 24 hr tablet Take 1 tablet (60 mg total) by mouth daily. 03/16/18  Yes Dorothy Spark, MD  latanoprost (XALATAN) 0.005 % ophthalmic solution Place 1 drop into the left eye at bedtime. 10/06/19  Yes [provider]  nitroGLYCERIN (NITROSTAT) 0.4 MG SL tablet Place 1 tablet (0.4 mg total) under the tongue every 5 (five) minutes as needed for chest pain. 03/29/16  Yes Dorothy Spark, MD  glucose blood (ONETOUCH VERIO) test strip Use as instructed to test blood sugar 3 times daily E11.65 03/12/19   Shamleffer, Melanie Crazier, MD  Insulin Pen Needle (B-D UF III MINI PEN NEEDLES) 31G X 5 MM MISC Four  times daily 06/30/18   Shamleffer, Melanie Crazier, MD    Physical Exam: Vitals:   01/11/20 0825 01/11/20 0854 01/11/20 0930 01/11/20 1024  BP: (!) 166/76 (!) 153/75 (!) 154/70 (!) 168/71  Pulse:      Resp: 17  11 18   Temp:      TempSrc:      SpO2:      Weight:      Height:        Constitutional: NAD, calm, comfortable on BiPAP, feels much improved Eyes: PERRL, lids and conjunctivae normal ENMT: On BiPAP, could not examine further Neck: normal, supple, no masses, no thyromegaly Respiratory: clear to auscultation bilaterally, no wheezing, no crackles. Normal respiratory effort. No accessory muscle use.  Cardiovascular: Regular rate and rhythm, no murmurs / rubs / gallops.  2+ lower  extremity pitting edema. 2+ pedal pulses. No carotid  bruits.  Abdomen: Mildly bloated/distended -baseline per patient, soft, no tenderness, no masses palpated. No hepatosplenomegaly. Bowel sounds positive.  Musculoskeletal: + + Edema in lower extremities bilaterally, no clubbing / cyanosis. No joint deformity upper and lower extremities. Good ROM. Normal muscle tone.  Neurologic: CN 2-12 grossly intact. Sensation intact, DTR normal. Strength 5/5 in all 4.  Psychiatric: Normal judgment and insight. Alert and oriented x 3. Normal mood.  SKIN/catheters: Lower extremity edema with chronic stasis dermatitis/hyperpigmentation.  Labs on Admission: I have personally reviewed following labs and imaging studies  CBC: Recent Labs  Lab 01/11/20 0545  WBC 7.9  NEUTROABS 5.7  HGB 11.4*  HCT 37.7*  MCV 104.4*  PLT 481   Basic Metabolic Panel: Recent Labs  Lab 01/11/20 0545  NA 137  K 4.6  CL 108  CO2 24  GLUCOSE 236*  BUN 34*  CREATININE 2.56*  CALCIUM 8.9   GFR: Estimated Creatinine Clearance: 28.3 mL/min (A) (by C-G formula based on SCr of 2.56 mg/dL (H)). Recent Labs  Lab 01/11/20 0545  WBC 7.9   Liver Function Tests: Recent Labs  Lab 01/11/20 0545  AST 56*  ALT 33  ALKPHOS 102    BILITOT 0.8  PROT 7.4  ALBUMIN 3.1*   No results for input(s): LIPASE, AMYLASE in the last 168 hours. No results for input(s): AMMONIA in the last 168 hours. Coagulation Profile: No results for input(s): INR, PROTIME in the last 168 hours. Cardiac Enzymes: No results for input(s): CKTOTAL, CKMB, CKMBINDEX, TROPONINI in the last 168 hours. BNP (last 3 results) No results for input(s): PROBNP in the last 8760 hours. HbA1C: No results for input(s): HGBA1C in the last 72 hours. CBG: No results for input(s): GLUCAP in the last 168 hours. Lipid Profile: No results for input(s): CHOL, HDL, LDLCALC, TRIG, CHOLHDL, LDLDIRECT in the last 72 hours. Thyroid Function Tests: No results for input(s): TSH, T4TOTAL, FREET4, T3FREE, THYROIDAB in the last 72 hours. Anemia Panel: No results for input(s): VITAMINB12, FOLATE, FERRITIN, TIBC, IRON, RETICCTPCT in the last 72 hours. Urine analysis:    Component Value Date/Time   COLORURINE YELLOW 07/31/2017 1840   APPEARANCEUR CLEAR 07/31/2017 1840   LABSPEC 1.020 07/31/2017 1840   PHURINE 6.0 07/31/2017 1840   GLUCOSEU >=500 (A) 07/31/2017 1840   HGBUR NEGATIVE 07/31/2017 1840   BILIRUBINUR NEGATIVE 07/31/2017 1840   KETONESUR 5 (A) 07/31/2017 [place] 07/31/2017 1840   UROBILINOGEN 1.0 07/19/2011 1335   NITRITE POSITIVE (A) 07/31/2017 1840   LEUKOCYTESUR NEGATIVE 07/31/2017 1840    Radiological Exams on Admission: Personally reviewed  DG Chest Portable 1 View  Result Date: 01/11/2020 CLINICAL DATA:  Shortness of breath. EXAM: PORTABLE CHEST 1 VIEW COMPARISON:  07/31/2017 FINDINGS: The cardiac silhouette is moderately enlarged. Aortic atherosclerosis is noted. Symmetric hazy airspace and interstitial densities are present bilaterally. No sizable pleural effusion is identified although the right lateral costophrenic angle was incompletely imaged. No pneumothorax or acute osseous abnormality is seen. Most IMPRESSION: Cardiomegaly  and bilateral lung opacities compatible with edema. Electronically Signed   By: Logan Bores M.D.   On: 01/11/2020 07:15    EKG: Independently reviewed.  Normal sinus rhythm with motion artifact, LVH pattern and biatrial enlargement pattern.  Prolonged QTC at 497 ms     Assessment and Plan:   Principal Problem:   Hypertensive emergency Active Problems:   Acute exacerbation of CHF (congestive heart failure) (HCC)   Acute respiratory failure (HCC)   Prolonged QT interval  Dyslipidemia   Hypertrophic cardiomyopathy (HCC)   Hyperglycemia   OSA (obstructive sleep apnea)   Type 2 diabetes mellitus with stage 3a chronic kidney disease, with long-term current use of insulin ([place])    1.  Hypertensive emergency: Patient had chest discomfort and acute pulmonary edema in the setting of uncontrolled/significantly elevated blood pressure.Patient states at baseline his SBP is around 150s but lately has had blood pressure spikes to 160s and 180s, at times in 200s. Symptoms improving with nitro drip and IV diuresis.  Patient on several antihypertensives at baseline-clonidine, hydralazine, Coreg, Norvasc and Imdur.  Currently on nitro drip-started on clonidine patch, can titrate nitro drip to off.  Will resume home medications-hopefully can come off BiPAP and tolerate p.o. medications.    2.  Acute diastolic CHF exacerbation: Likely related to problem #1 with underlying diastolic dysfunction..  Last echo in August 2020 revealed severely enlarged left ventricle, HOCM and EF 60 to 65%.  Will repeat echo.  Continue IV diuresis with Lasix.  Not sure if patient was prescribed additional Zaroxolyn as outpatient (not listed in home meds).  Monitor daily weights, I's and O's.  Repeat echo   3.  Acute hypoxic respiratory failure: In the setting of problem #1 and problem #2.  Currently on BiPAP, taper to off as tolerated with IV diuresis.. He appears comfortable and feels a lot better, anticipate can come off BiPAP  by this afternoon.  4.  Prolonged QTC: Avoid QT prolonging agents. Repeat EKG in a.m. Check magnesium level-replace to keep above 2.0 .  Potassium appears to be greater than 4.  5.  Diabetes mellitus type 2, insulin-dependent: Patient on premeal NovoLog 5 to 6 units before meals and Toujeo 18 units nightly at baseline.  Blood glucose somewhat elevated today but apparently well controlled at home.Last hemoglobin A1c in May 2021 was 6.0.  Will resume home medications, sliding scale insulin and monitor response.  6.  CKD stage IIIb: Baseline creatinine appears to have fluctuated mostly around 2.0-2.7 in the last year.  Monitor renal function closely in the setting of IV diuresis.  7.  Sleep apnea: Patient advised to bring in home CPAP for RT to evaluate as he has concerns regarding facemask functioning.  He uses CPAP at 46mmHg, will resume the same tonight if able to come off BiPAP.  8.  Hyperlipidemia: Resume statins   9.  Chest discomfort, elevated troponin: In the setting of problem #1.  Currently chest discomfort resolved.  Troponin I at 63->61.  Patient noted to have history of chronically elevated troponins in the setting of CKD.  EKG without significant ST-T changes.  Monitor for now and repeat echo for any new wall motion abnormalities..  10. Glaucoma: Resume home medications  DVT prophylaxis: Lovenox  COVID screen: Negative  Code Status: Full code   .Health care proxy would be his brother  Patient/Family Communication: Discussed with patient and all questions answered to satisfaction.  Consults called: None Admission status :I certify that at the point of admission it is my clinical judgment that the patient will require inpatient hospital care spanning beyond 2 midnights from the point of admission due to high intensity of service and high frequency of surveillance required.Inpatient status is judged to be reasonable and necessary in order to provide the required intensity of service to  ensure the patient's safety. The patient's presenting symptoms, physical exam findings, and initial radiographic and laboratory data in the context of their chronic comorbidities is felt to place them at high  risk for further clinical deterioration. The following factors support the patient status of inpatient : Acute hypoxic respiratory failure and hypertensive emergency requiring BiPAP therapy, IV nitroglycerin drip.     Guilford Shi MD [facility] Pager in [place]  If 7PM-7AM, please contact night-coverage [email or web address]   01/11/2020, 10:51 AM

## 2020-01-11 NOTE — ED Notes (Signed)
Dinner Tray Ordered @ 1709. 

## 2020-01-11 NOTE — ED Triage Notes (Signed)
Pt bib EMS from home with c/o shortness of breath, CP with breaths, BLE. Pt 77% on RA with EMS. CPAP applied, increased to 96%. EMS reports bilateral crackles, BP 215/110, CBG 251, pulse 85. Given SL nitro x3 en route. Pt breathing labored on arrival, A&Ox4, denies pain. RT and MD present at bedside. Bipap applied by RT.

## 2020-01-12 LAB — CBC WITH DIFFERENTIAL/PLATELET
Abs Immature Granulocytes: 0.01 10*3/uL (ref 0.00–0.07)
Basophils Absolute: 0 10*3/uL (ref 0.0–0.1)
Basophils Relative: 1 %
Eosinophils Absolute: 0.1 10*3/uL (ref 0.0–0.5)
Eosinophils Relative: 2 %
HCT: 32.1 % — ABNORMAL LOW (ref 39.0–52.0)
Hemoglobin: 10.2 g/dL — ABNORMAL LOW (ref 13.0–17.0)
Immature Granulocytes: 0 %
Lymphocytes Relative: 21 %
Lymphs Abs: 1 10*3/uL (ref 0.7–4.0)
MCH: 31.9 pg (ref 26.0–34.0)
MCHC: 31.8 g/dL (ref 30.0–36.0)
MCV: 100.3 fL — ABNORMAL HIGH (ref 80.0–100.0)
Monocytes Absolute: 0.4 10*3/uL (ref 0.1–1.0)
Monocytes Relative: 8 %
Neutro Abs: 3.4 10*3/uL (ref 1.7–7.7)
Neutrophils Relative %: 68 %
Platelets: 190 10*3/uL (ref 150–400)
RBC: 3.2 MIL/uL — ABNORMAL LOW (ref 4.22–5.81)
RDW: 13.1 % (ref 11.5–15.5)
WBC: 4.9 10*3/uL (ref 4.0–10.5)
nRBC: 0 % (ref 0.0–0.2)

## 2020-01-12 LAB — BASIC METABOLIC PANEL
Anion gap: 6 (ref 5–15)
BUN: 36 mg/dL — ABNORMAL HIGH (ref 8–23)
CO2: 26 mmol/L (ref 22–32)
Calcium: 8.6 mg/dL — ABNORMAL LOW (ref 8.9–10.3)
Chloride: 104 mmol/L (ref 98–111)
Creatinine, Ser: 2.65 mg/dL — ABNORMAL HIGH (ref 0.61–1.24)
GFR calc Af Amer: 27 mL/min — ABNORMAL LOW (ref >60)
GFR calc non Af Amer: 23 mL/min — ABNORMAL LOW (ref >60)
Glucose, Bld: 179 mg/dL — ABNORMAL HIGH (ref 70–99)
Potassium: 4 mmol/L (ref 3.5–5.1)
Sodium: 136 mmol/L (ref 135–145)

## 2020-01-12 LAB — GLUCOSE, CAPILLARY
Glucose-Capillary: 67 mg/dL — ABNORMAL LOW (ref 70–99)
Glucose-Capillary: 157 mg/dL — ABNORMAL HIGH (ref 70–99)
Glucose-Capillary: 141 mg/dL — ABNORMAL HIGH (ref 70–99)

## 2020-01-12 LAB — TSH: TSH: 2.415 u[IU]/mL (ref 0.350–4.500)

## 2020-01-12 LAB — MAGNESIUM: Magnesium: 1.7 mg/dL (ref 1.7–2.4)

## 2020-01-12 MED ORDER — INSULIN GLARGINE 100 UNIT/ML ~~LOC~~ SOLN
15.0000 [IU] | Freq: Every day | SUBCUTANEOUS | Status: DC
  Administered 2020-01-12 – 2020-01-15 (×4): 15 [IU] via SUBCUTANEOUS
  Filled 2020-01-12 (×5): qty 0.15

## 2020-01-12 NOTE — Progress Notes (Signed)
PROGRESS NOTE  Robert Chaney YCX:[id number] DOB: [birth year] DOA: 01/11/2020 PCP: [name], MD  HPI/Recap of past 24 hours: HPI from Dr [name] is a 69 y.o. male with history h/o hypertension, hyperlipidemia, hypertrophic cardiomyopathy, NSVT/PSVT, insulin-dependent diabetes mellitus type 2, CKD stage III, chronic anemia, sleep apnea on CPAP nightly, presented to ED with complaints of dyspnea, lower extremity edema and chest pain. He stated he checked pulse ox with his home monitor and was reading 60%.  On EMS arrival patient was noted to be hypertensive with blood pressure 215/110, heart rate 85, labored breathing, hypoxic with O2 sats 77% on room air and blood glucose of 251.  CPAP was applied with improved sats to 96%.  Patient reports noncompliance with CPAP at night (states ends up using about 4-5 times a week, feels his mask is not working) but claims compliance with blood pressure medications.  He reports worsening leg swellings over the last month.  A week and a half back he was evaluated by PCP and prescribed extra diuretic medication, when he did not have any significant improvement with the new diuretic, he called PCP office again 2 days back when he was told to double up the dosage.  Patient states his leg swellings did improve somewhat but yesterday he felt he was swollen again. In the ED, BP 220/96 on arrival, patient placed on nitroglycerin drip, BiPAP.  Patient admitted for further management.     Today, patient reports feeling better, denies any chest pain, worsening shortness of breath, abdominal pain, nausea/vomiting, fever/chills, cough.  Assessment/Plan: Principal Problem:   Hypertensive emergency Active Problems:   Prolonged QT interval   Dyslipidemia   Hypertrophic cardiomyopathy (HCC)   Hyperglycemia   OSA (obstructive sleep apnea)   Type 2 diabetes mellitus with stage 3a chronic kidney disease, with long-term current use of insulin (HCC)   Acute  exacerbation of CHF (congestive heart failure) (HCC)   Acute respiratory failure (HCC)   Hypertensive crisis Status post nitroglycerin drip, BP better controlled Continue home Coreg, clonidine po/patch, hydralazine, Imdur Monitor closely  Acute hypoxic respiratory failure likely 2/2 acute diastolic HF History of HOCM Currently on room air, no longer requiring BiPAP BNP 628 Troponin 63-61, likely due to demand ischemia EKG with no acute ST changes Chest x-ray showed cardiomegaly and bilateral lung opacities compatible with edema Echo showed prior history of HOCM, EF of 60 to 49%, grade 2 diastolic dysfunction, no regional wall motion abnormality Continue diuresis with Lasix Strict I's and O's, daily weights Monitor closely  Prolonged QTC Avoid QTC prolonging agents Replace electrolytes aggressively  Diabetes mellitus type 2 Last A1c in 10/2019 was 6 SSI, Lantus, hypoglycemic protocol, Accu-Cheks   CKD stage IIIb Baseline creatinine around 2-2.7 Monitor closely while diuresing Daily BMP  Hyperlipidemia Continue statins  Obstructive sleep apnea CPAP  Glaucoma Continue eyedrops       Malnutrition Type:      Malnutrition Characteristics:      Nutrition Interventions:       Estimated body mass index is 27.55 kg/m as calculated from the following:   Height as of this encounter: 5\' 8"  (1.727 m).   Weight as of this encounter: 82.2 kg.     Code Status: Full  Family Communication: Discussed extensively with patient  Disposition Plan: Status is: Inpatient  Remains inpatient appropriate because:Inpatient level of care appropriate due to severity of illness   Dispo: The patient is from: Home  Anticipated d/c is to: Home              Anticipated d/c date is: 1 day              Patient currently is not medically stable to d/c.     Consultants:  None  Procedures:  None  Antimicrobials:  None  DVT  prophylaxis: Lovenox   Objective: Vitals:   01/12/20 0837 01/12/20 1107 01/12/20 1154 01/12/20 1608  BP: (!) 164/77 (!) 176/82  (!) 160/74  Pulse: 74 72 76 79  Resp: 17 17 20 14   Temp: 97.8 F (36.6 C) 98.2 F (36.8 C)  98.4 F (36.9 C)  TempSrc: Oral Oral  Oral  SpO2: 93% 95% 91% 92%  Weight:      Height:        Intake/Output Summary (Last 24 hours) at 01/12/2020 1925 Last data filed at 01/12/2020 1432 Gross per 24 hour  Intake --  Output 2125 ml  Net -2125 ml   Filed Weights   01/11/20 0701 01/11/20 1953 01/12/20 0357  Weight: 84 kg 82.2 kg 82.2 kg    Exam:  General: NAD   Cardiovascular: S1, S2 present  Respiratory: CTAB  Abdomen: Soft, nontender, nondistended, bowel sounds present  Musculoskeletal: No bilateral pedal edema noted  Skin: Normal  Psychiatry: Normal mood    Data Reviewed: CBC: Recent Labs  Lab 01/11/20 0545  WBC 7.9  NEUTROABS 5.7  HGB 11.4*  HCT 37.7*  MCV 104.4*  PLT 323   Basic Metabolic Panel: Recent Labs  Lab 01/11/20 0545 01/11/20 0800  NA 137  --   K 4.6  --   CL 108  --   CO2 24  --   GLUCOSE 236*  --   BUN 34*  --   CREATININE 2.56*  --   CALCIUM 8.9  --   MG  --  1.9   GFR: Estimated Creatinine Clearance: 28.1 mL/min (A) (by C-G formula based on SCr of 2.56 mg/dL (H)). Liver Function Tests: Recent Labs  Lab 01/11/20 0545  AST 56*  ALT 33  ALKPHOS 102  BILITOT 0.8  PROT 7.4  ALBUMIN 3.1*   No results for input(s): LIPASE, AMYLASE in the last 168 hours. No results for input(s): AMMONIA in the last 168 hours. Coagulation Profile: No results for input(s): INR, PROTIME in the last 168 hours. Cardiac Enzymes: No results for input(s): CKTOTAL, CKMB, CKMBINDEX, TROPONINI in the last 168 hours. BNP (last 3 results) No results for input(s): PROBNP in the last 8760 hours. HbA1C: No results for input(s): HGBA1C in the last 72 hours. CBG: Recent Labs  Lab 01/11/20 1645 01/11/20 2321 01/12/20 0621  01/12/20 1153 01/12/20 1606  GLUCAP 154* 142* 67* 157* 141*   Lipid Profile: No results for input(s): CHOL, HDL, LDLCALC, TRIG, CHOLHDL, LDLDIRECT in the last 72 hours. Thyroid Function Tests: Recent Labs    01/12/20 0445  TSH 2.415   Anemia Panel: No results for input(s): VITAMINB12, FOLATE, FERRITIN, TIBC, IRON, RETICCTPCT in the last 72 hours. Urine analysis:    Component Value Date/Time   COLORURINE YELLOW 07/31/2017 1840   APPEARANCEUR CLEAR 07/31/2017 1840   LABSPEC 1.020 07/31/2017 1840   PHURINE 6.0 07/31/2017 1840   GLUCOSEU >=500 (A) 07/31/2017 1840   HGBUR NEGATIVE 07/31/2017 1840   BILIRUBINUR NEGATIVE 07/31/2017 1840   KETONESUR 5 (A) 07/31/2017 [place] 07/31/2017 1840   UROBILINOGEN 1.0 07/19/2011 1335   NITRITE POSITIVE (A) 07/31/2017 1840  LEUKOCYTESUR NEGATIVE 07/31/2017 1840   Sepsis Labs: @LABRCNTIP (procalcitonin:4,lacticidven:4)  ) Recent Results (from the past 240 hour(s))  SARS Coronavirus 2 by RT PCR (hospital order, performed in [facility] hospital lab) Nasopharyngeal Nasopharyngeal Swab     Status: None   Collection Time: 01/11/20  5:41 AM   Specimen: Nasopharyngeal Swab  Result Value Ref Range Status   SARS Coronavirus 2 NEGATIVE NEGATIVE Final    Comment: (NOTE) SARS-CoV-2 target nucleic acids are NOT DETECTED.  The SARS-CoV-2 RNA is generally detectable in upper and lower respiratory specimens during the acute phase of infection. The lowest concentration of SARS-CoV-2 viral copies this assay can detect is 250 copies / mL. A negative result does not preclude SARS-CoV-2 infection and should not be used as the sole basis for treatment or other patient management decisions.  A negative result may occur with improper specimen collection / handling, submission of specimen other than nasopharyngeal swab, presence of viral mutation(s) within the areas targeted by this assay, and inadequate number of viral copies (<250 copies  / mL). A negative result must be combined with clinical observations, patient history, and epidemiological information.  Fact Sheet for Patients:   StrictlyIdeas.no  Fact Sheet for Healthcare Providers: BankingDealers.co.za  This test is not yet approved or  cleared by the [place] FDA and has been authorized for detection and/or diagnosis of SARS-CoV-2 by FDA under an Emergency Use Authorization (EUA).  This EUA will remain in effect (meaning this test can be used) for the duration of the COVID-19 declaration under Section 564(b)(1) of the Act, 21 U.S.C. section 360bbb-3(b)(1), unless the authorization is terminated or revoked sooner.  Performed at [facility], [place] [address] [place], [place] [address]       Studies: No results found.  Scheduled Meds: . atorvastatin  20 mg Oral Daily  . brimonidine  1 drop Right Eye BID  . carvedilol  25 mg Oral BID  . cholecalciferol  1,000 Units Oral Daily  . cloNIDine  0.2 mg Transdermal Weekly  . enoxaparin (LOVENOX) injection  30 mg Subcutaneous Q24H  . furosemide  40 mg Intravenous BID  . hydrALAZINE  100 mg Oral BID  . insulin aspart  0-5 Units Subcutaneous QHS  . insulin aspart  0-9 Units Subcutaneous TID WC  . insulin glargine  18 Units Subcutaneous QHS  . isosorbide mononitrate  60 mg Oral Daily  . latanoprost  1 drop Left Eye QHS    Continuous Infusions: . nitroGLYCERIN Stopped (01/11/20 1833)     LOS: 1 day     Alma Friendly, MD [facility]  If 7PM-7AM, please contact night-coverage [email or web address] 01/12/2020, 7:25 PM

## 2020-01-12 NOTE — ED Provider Notes (Signed)
[facility] 4E CV SURGICAL PROGRESSIVE CARE Provider Note   CSN: [id number] Arrival date & time: 01/11/20  0534     History Chief Complaint  Patient presents with  . Respiratory Distress    Robert Chaney is a 69 y.o. male.  Acute onset of dyspnea, EMS called and he was hypoxic and in respiratory distress was started on BiPAP given some steroids and brought here for further evaluation.  Patient states he has felt like this in the past when he has had CHF exacerbations.  He does endorse bilateral lower extremity edema.  No other associated symptoms.  No fever or cough.  No chest pain at this time.  The history is provided by the patient. The history is limited by a language barrier.  Shortness of Breath Severity:  Moderate Onset quality:  Sudden Timing:  Constant Progression:  Worsening Chronicity:  Recurrent Context: not activity   Relieved by:  None tried Worsened by:  Nothing Ineffective treatments:  None tried      Past Medical History:  Diagnosis Date  . Altered mental status    a. 05/2017 - adm with blurred vision, somnolence in setting of AKI and high blood sugar.  . Anemia   . CKD (chronic kidney disease), stage III   . Diabetes mellitus   . Diastolic CHF, chronic (HCC)    A.  03/2009 Echo: EF 60-65%, Gr II diast dysfxn  . Elevated troponin    a. 2013 - troponin 1.4. b. 2019 - troponin 0.32; neg nuc 07/2017.  . Glaucoma   . Hyperlipidemia    HYPERCHOLESTEROLEMIA  . Hypertension    MARKED LEFT VENTRICULAR HYPERTROPHY BY PREVIOUS ECHOCARDIOGRAM--HE HAS HYPERDYNAMIC LEFT VENTRICULAR SYSTOLIC FUNCTION AND HAS IMPAIRED RELAXATION BY ECHO  . Hypertrophic cardiomyopathy ([place])   . NSVT (nonsustained ventricular tachycardia) ([place])   . Premature atrial contractions   . PVC's (premature ventricular contractions)   . SVT (supraventricular tachycardia) [facility])     Patient Active Problem List   Diagnosis Date Noted  . Acute exacerbation of CHF (congestive heart failure) ([place])  01/11/2020  . Hypertensive emergency 01/11/2020  . Acute respiratory failure ([place]) 01/11/2020  . Type 2 diabetes mellitus with retinopathy, with long-term current use of insulin ([place]) 07/16/2019  . Type 2 diabetes mellitus with stage 3a chronic kidney disease, with long-term current use of insulin ([place]) 07/16/2019  . Type 2 diabetes mellitus with diabetic polyneuropathy, with long-term current use of insulin ([place]) 07/16/2019  . OSA (obstructive sleep apnea) 03/30/2019  . Hyperglycemia 07/31/2017  . Near syncope 07/31/2017  . Acute-on-chronic kidney injury ([place]) 07/31/2017  . Acute renal injury ([place]) 06/09/2017  . Hypertensive heart disease 11/07/2016  . Hypertrophic cardiomyopathy ([place]) 07/26/2015  . Dyslipidemia 05/19/2013  . Diabetic hyperosmolar non-ketotic state ([place]) 07/19/2011  . DM (diabetes mellitus) with complications ([place]) 40/98/1191  . Acute on chronic renal failure ([place]) 07/19/2011  . Troponin level elevated 07/19/2011  . Prolonged QT interval 07/19/2011  . Hyperkalemia 07/19/2011  . Volume depletion 07/19/2011  . Diastolic CHF, chronic ([place]) 07/19/2011  . Essential hypertension 07/19/2011  . Elevated CPK 07/19/2011  . Hypercholesterolemia 02/20/2011  . Benign hypertensive heart disease without heart failure 02/20/2011    Past Surgical History:  Procedure Laterality Date  . NO PAST SURGERIES         Family History  Problem Relation Age of Onset  . Hypertension Father   . Stroke Father   . Hypertension Mother   . Diabetes Brother   . Hypertension Brother   .  Diabetes Brother     Social History   Tobacco Use  . Smoking status: Never Smoker  . Smokeless tobacco: Never Used  Vaping Use  . Vaping Use: Never used  Substance Use Topics  . Alcohol use: No  . Drug use: No    Home Medications Prior to Admission medications   Medication Sig Start Date End Date Taking? Authorizing Provider  amLODipine (NORVASC) 5 MG tablet Take 1 tablet (5 mg total) by  mouth at bedtime. 11/26/19  Yes Skeet Latch, MD  atorvastatin (LIPITOR) 20 MG tablet Take 1 tablet (20 mg total) by mouth daily. 03/16/18  Yes Dorothy Spark, MD  brimonidine (ALPHAGAN) 0.2 % ophthalmic solution Place 1 drop into the right eye 2 (two) times daily. 10/06/19  Yes [provider]  carvedilol (COREG) 25 MG tablet Take 1 tablet (25 mg total) by mouth 2 (two) times daily. 10/06/19  Yes Skeet Latch, MD  cholecalciferol (VITAMIN D3) 25 MCG (1000 UT) tablet Take 1,000 Units by mouth daily.   Yes [provider]  cloNIDine (CATAPRES) 0.1 MG tablet Take 0.1 mg by mouth 3 (three) times daily.   Yes [provider]  furosemide (LASIX) 20 MG tablet Take 40mg  daily for 3 days (June/5 to June/8), then continue taking 20mg  daily. Patient taking differently: Take 20 mg by mouth daily.  11/26/19  Yes Skeet Latch, MD  hydrALAZINE (APRESOLINE) 100 MG tablet Take 1 tablet (100 mg total) by mouth 2 (two) times daily. 09/14/19  Yes Imogene Burn, PA-C  insulin aspart (NOVOLOG) 100 UNIT/ML injection Inject 0-6 Units into the skin 3 (three) times daily with meals. Sliding scale 09/02/19  Yes [provider]  Insulin Glargine (TOUJEO MAX SOLOSTAR) 300 UNIT/ML SOPN Inject 18 Units into the skin at bedtime.   Yes [provider]  isosorbide mononitrate (IMDUR) 60 MG 24 hr tablet Take 1 tablet (60 mg total) by mouth daily. 03/16/18  Yes Dorothy Spark, MD  latanoprost (XALATAN) 0.005 % ophthalmic solution Place 1 drop into the left eye at bedtime. 10/06/19  Yes [provider]  nitroGLYCERIN (NITROSTAT) 0.4 MG SL tablet Place 1 tablet (0.4 mg total) under the tongue every 5 (five) minutes as needed for chest pain. 03/29/16  Yes Dorothy Spark, MD  glucose blood (ONETOUCH VERIO) test strip Use as instructed to test blood sugar 3 times daily E11.65 03/12/19   Shamleffer, Melanie Crazier, MD  Insulin Pen Needle (B-D UF III MINI PEN NEEDLES) 31G  X 5 MM MISC Four times daily 06/30/18   Shamleffer, Melanie Crazier, MD    Allergies    Aspirin  Review of Systems   Review of Systems  Respiratory: Positive for shortness of breath.   All other systems reviewed and are negative.   Physical Exam Updated Vital Signs BP (!) 172/95   Pulse 63   Temp 98.3 F (36.8 C) (Axillary)   Resp 13   Ht 5\' 8"  (1.727 m)   Wt 82.2 kg   SpO2 98%   BMI 27.55 kg/m   Physical Exam Vitals and nursing note reviewed.  Constitutional:      General: He is in acute distress.     Appearance: He is well-developed.  HENT:     Head: Normocephalic and atraumatic.     Nose: No congestion or rhinorrhea.     Mouth/Throat:     Mouth: Mucous membranes are moist.     Pharynx: Oropharynx is clear.  Eyes:  Pupils: Pupils are equal, round, and reactive to light.  Cardiovascular:     Rate and Rhythm: Normal rate.  Pulmonary:     Effort: Respiratory distress present.     Breath sounds: Rales present.  Abdominal:     General: There is no distension.  Musculoskeletal:        General: Normal range of motion.     Cervical back: Normal range of motion.  Skin:    General: Skin is warm and dry.  Neurological:     General: No focal deficit present.     Mental Status: He is alert.     ED Results / Procedures / Treatments   Labs (all labs ordered are listed, but only abnormal results are displayed) Labs Reviewed  CBC WITH DIFFERENTIAL/PLATELET - Abnormal; Notable for the following components:      Result Value   RBC 3.61 (*)    Hemoglobin 11.4 (*)    HCT 37.7 (*)    MCV 104.4 (*)    All other components within normal limits  COMPREHENSIVE METABOLIC PANEL - Abnormal; Notable for the following components:   Glucose, Bld 236 (*)    BUN 34 (*)    Creatinine, Ser 2.56 (*)    Albumin 3.1 (*)    AST 56 (*)    GFR calc non Af Amer 24 (*)    GFR calc Af Amer 28 (*)    All other components within normal limits  BRAIN NATRIURETIC PEPTIDE - Abnormal;  Notable for the following components:   B Natriuretic Peptide 628.4 (*)    All other components within normal limits  GLUCOSE, CAPILLARY - Abnormal; Notable for the following components:   Glucose-Capillary 142 (*)    All other components within normal limits  GLUCOSE, CAPILLARY - Abnormal; Notable for the following components:   Glucose-Capillary 67 (*)    All other components within normal limits  CBG MONITORING, ED - Abnormal; Notable for the following components:   Glucose-Capillary 169 (*)    All other components within normal limits  CBG MONITORING, ED - Abnormal; Notable for the following components:   Glucose-Capillary 154 (*)    All other components within normal limits  TROPONIN I (HIGH SENSITIVITY) - Abnormal; Notable for the following components:   Troponin I (High Sensitivity) 63 (*)    All other components within normal limits  TROPONIN I (HIGH SENSITIVITY) - Abnormal; Notable for the following components:   Troponin I (High Sensitivity) 61 (*)    All other components within normal limits  SARS CORONAVIRUS 2 BY RT PCR (HOSPITAL ORDER, [place] [facility])  MAGNESIUM  TSH    EKG None  Radiology DG Chest Portable 1 View  Result Date: 01/11/2020 CLINICAL DATA:  Shortness of breath. EXAM: PORTABLE CHEST 1 VIEW COMPARISON:  07/31/2017 FINDINGS: The cardiac silhouette is moderately enlarged. Aortic atherosclerosis is noted. Symmetric hazy airspace and interstitial densities are present bilaterally. No sizable pleural effusion is identified although the right lateral costophrenic angle was incompletely imaged. No pneumothorax or acute osseous abnormality is seen. Most IMPRESSION: Cardiomegaly and bilateral lung opacities compatible with edema. Electronically Signed   By: Logan Bores M.D.   On: 01/11/2020 07:15   ECHOCARDIOGRAM COMPLETE  Result Date: 01/11/2020    ECHOCARDIOGRAM REPORT   Patient Name:   BRAYLIN XU Date of Exam: 01/11/2020 Medical Rec #:   [id number]     Height:       68.0 in Accession #:  [id number]    Weight:       185.2 lb Date of Birth:  [birth year]      BSA:          1.978 m Patient Age:    62 years      BP:           177/84 mmHg Patient Gender: M             HR:           63 bpm. Exam Location:  Inpatient Procedure: 2D Echo Indications:    CHF 428.31  History:        Patient has prior history of Echocardiogram examinations, most                 recent 02/19/2019. CHF; Risk Factors:Diabetes, Dyslipidemia and                 Hypertension. NSVT. PAC's. PVC's. SVT.  Sonographer:    Jannett Celestine RDCS (AE) Referring Phys: [id number] [place]  1. Severe asymmetric LVH of the basal septum up to 1.8 cm. Prior history of HOCM. No chordal or mitral valve SAM. No significant LVOT gradient on this study (HR 63 bpm). No provocative maneuvers performed. Left ventricular ejection fraction, by estimation, is 60 to 65%. The left ventricle has normal function. The left ventricle has no regional wall motion abnormalities. There is severe asymmetric left ventricular hypertrophy of the basal-septal segment. Left ventricular diastolic parameters are  consistent with Grade II diastolic dysfunction (pseudonormalization). Elevated left atrial pressure.  2. Right ventricular systolic function is normal. The right ventricular size is normal. Tricuspid regurgitation signal is inadequate for assessing PA pressure.  3. The mitral valve is grossly normal. Mild mitral valve regurgitation. No evidence of mitral stenosis.  4. The aortic valve is tricuspid. Aortic valve regurgitation is not visualized. No aortic stenosis is present.  5. There is mild dilatation of the ascending aorta measuring 41 mm.  6. The inferior vena cava is normal in size with greater than 50% respiratory variability, suggesting right atrial pressure of 3 mmHg. Comparison(s): No significant change from prior study. No significant LVOT gradient on this study. FINDINGS  Left Ventricle: Severe  asymmetric LVH of the basal septum up to 1.8 cm. Prior history of HOCM. No chordal or mitral valve SAM. No significant LVOT gradient on this study (HR 63 bpm). No provocative maneuvers performed. Left ventricular ejection fraction, by estimation, is 60 to 65%. The left ventricle has normal function. The left ventricle has no regional wall motion abnormalities. The left ventricular internal cavity size was normal in size. There is severe asymmetric left ventricular hypertrophy of the basal-septal segment. Left ventricular diastolic parameters are consistent with Grade II diastolic dysfunction (pseudonormalization). Elevated left atrial pressure. Right Ventricle: The right ventricular size is normal. No increase in right ventricular wall thickness. Right ventricular systolic function is normal. Tricuspid regurgitation signal is inadequate for assessing PA pressure. Left Atrium: Left atrial size was normal in size. Right Atrium: Right atrial size was normal in size. Pericardium: Trivial pericardial effusion is present. The pericardial effusion is circumferential. Mitral Valve: The mitral valve is grossly normal. Mild mitral valve regurgitation. No evidence of mitral valve stenosis. Tricuspid Valve: The tricuspid valve is grossly normal. Tricuspid valve regurgitation is not demonstrated. No evidence of tricuspid stenosis. Aortic Valve: The aortic valve is tricuspid. Aortic valve regurgitation is not visualized. No aortic stenosis is present. Pulmonic Valve: The pulmonic valve was grossly normal. Pulmonic  valve regurgitation is trivial. No evidence of pulmonic stenosis. Aorta: The aortic root is normal in size and structure. There is mild dilatation of the ascending aorta measuring 41 mm. Venous: The inferior vena cava is normal in size with greater than 50% respiratory variability, suggesting right atrial pressure of 3 mmHg. IAS/Shunts: The atrial septum is grossly normal.  LEFT VENTRICLE PLAX 2D LVIDd:         5.00 cm   Diastology LVIDs:         3.10 cm  LV e' lateral:   5.55 cm/s LV PW:         1.40 cm  LV E/e' lateral: 16.1 LV IVS:        1.40 cm  LV e' medial:    4.35 cm/s LVOT diam:     2.20 cm  LV E/e' medial:  20.6 LV SV:         116 LV SV Index:   59 LVOT Area:     3.80 cm  LEFT ATRIUM             Index       RIGHT ATRIUM           Index LA diam:        4.80 cm 2.43 cm/m  RA Area:     17.30 cm LA Vol (A2C):   77.1 ml 38.98 ml/m RA Volume:   42.30 ml  21.39 ml/m LA Vol (A4C):   44.5 ml 22.50 ml/m LA Biplane Vol: 60.1 ml 30.38 ml/m  AORTIC VALVE LVOT Vmax:   134.00 cm/s LVOT Vmean:  106.000 cm/s LVOT VTI:    0.305 m  AORTA Ao Root diam: 3.50 cm MITRAL VALVE MV Area (PHT): 2.16 cm    SHUNTS MV Decel Time: 352 msec    Systemic VTI:  0.30 m MV E velocity: 89.50 cm/s  Systemic Diam: 2.20 cm MV A velocity: 63.40 cm/s MV E/A ratio:  1.41 Eleonore Chiquito MD Electronically signed by Eleonore Chiquito MD Signature Date/Time: 01/11/2020/3:30:00 PM    Final     Procedures .Critical Care Performed by: Merrily Pew, MD Authorized by: Merrily Pew, MD   Critical care provider statement:    Critical care time (minutes):  45   Critical care was necessary to treat or prevent imminent or life-threatening deterioration of the following conditions:  Respiratory failure and cardiac failure   Critical care was time spent personally by me on the following activities:  Discussions with consultants, evaluation of patient's response to treatment, examination of patient, ordering and performing treatments and interventions, ordering and review of laboratory studies, ordering and review of radiographic studies, pulse oximetry, re-evaluation of patient's condition, obtaining history from patient or surrogate and review of old charts   (including critical care time)  Medications Ordered in ED Medications  nitroGLYCERIN 50 mg in dextrose 5 % 250 mL (0.2 mg/mL) infusion (0 mcg/min Intravenous Stopped 01/11/20 1833)  cloNIDine (CATAPRES -  Dosed in mg/24 hr) patch 0.2 mg (0.2 mg Transdermal Patch Applied 01/11/20 0854)  atorvastatin (LIPITOR) tablet 20 mg (20 mg Oral Given 01/11/20 1116)  carvedilol (COREG) tablet 25 mg (25 mg Oral Given 01/11/20 2314)  cloNIDine (CATAPRES) tablet 0.1 mg (0.1 mg Oral Given 01/11/20 1724)  furosemide (LASIX) injection 40 mg (40 mg Intravenous Given 01/11/20 1724)  hydrALAZINE (APRESOLINE) tablet 100 mg (100 mg Oral Given 01/11/20 2315)  isosorbide mononitrate (IMDUR) 24 hr tablet 60 mg (60 mg Oral Given 01/11/20 1116)  insulin glargine (LANTUS)  injection 18 Units (18 Units Subcutaneous Given 01/12/20 0030)  cholecalciferol (VITAMIN D3) tablet 1,000 Units (1,000 Units Oral Given 01/11/20 1116)  brimonidine (ALPHAGAN) 0.2 % ophthalmic solution 1 drop (1 drop Right Eye Not Given 01/11/20 2313)  latanoprost (XALATAN) 0.005 % ophthalmic solution 1 drop (1 drop Left Eye Not Given 01/11/20 2315)  insulin aspart (novoLOG) injection 0-9 Units (0 Units Subcutaneous Not Given 01/12/20 0636)  insulin aspart (novoLOG) injection 0-5 Units (0 Units Subcutaneous Not Given 01/11/20 2323)  enoxaparin (LOVENOX) injection 30 mg (30 mg Subcutaneous Given 01/11/20 1725)  furosemide (LASIX) injection 80 mg (80 mg Intravenous Given 01/11/20 0932)    ED Course  I have reviewed the triage vital signs and the nursing notes.  Pertinent labs & imaging results that were available during my care of the patient were reviewed by me and considered in my medical decision making (see chart for details).    MDM Rules/Calculators/A&P                          Consider possible COPD versus ACS versus pneumonia versus pneumothorax or CHF for the patient's acute onset of shortness of breath however ultimately with elevated BNP abnormal chest x-ray hypertension considered CHF is the most likely source.  Immediately started him on BiPAP and started high-dose nitroglycerin drip titrated very quickly to get the 400 mcg/kg/min until patient started  having decreasing blood pressure and decreasing respiratory distress.  Maintained there for quite some time and patient was stabilized.  Rest of work-up as above.  Discussed with hospitalist for admission to stepdown unit on nitroglycerin drip and BiPAP.   Final Clinical Impression(s) / ED Diagnoses Final diagnoses:  Acute on chronic congestive heart failure, unspecified heart failure type ([place])  Hypoxia  Acute pulmonary edema [facility])    Rx / DC Orders ED Discharge Orders    None       Laterica Matarazzo, Corene Cornea, MD 01/12/20 [phone]

## 2020-01-12 NOTE — Progress Notes (Signed)
Inpatient Diabetes Program Recommendations  AACE/ADA: New Consensus Statement on Inpatient Glycemic Control (2015)  Target Ranges:  Prepandial:   less than 140 mg/dL      Peak postprandial:   less than 180 mg/dL (1-2 hours)      Critically ill patients:  140 - 180 mg/dL   Lab Results  Component Value Date   GLUCAP 67 (L) 01/12/2020   HGBA1C 6.0 (A) 11/19/2019    Review of Glycemic Control Results for VONG, GARRINGER (MRN [id number]) as of 01/12/2020 09:03  Ref. Range 01/11/2020 11:51 01/11/2020 16:45 01/11/2020 23:21 01/12/2020 06:21  Glucose-Capillary Latest Ref Range: 70 - 99 mg/dL 169 (H) 154 (H) 142 (H) 67 (L)   Diabetes history: DM 2 Outpatient Diabetes medications: Toujeo 18 units, Novolog 0-6 units tid Current orders for Inpatient glycemic control:  Lantus 18 units Novolog 0-9 units tid + hs  Inpatient Diabetes Program Recommendations:    Pt ordered home dose of  Basal insulin had mild hypoglycemia 67.   -  Reduce Lantus to 15 units  Thanks,  Tama Headings RN, MSN, BC-ADM Inpatient Diabetes Coordinator Team Pager [phone] (8a-5p)

## 2020-01-13 ENCOUNTER — Encounter (HOSPITAL_COMMUNITY): Payer: Self-pay | Admitting: Internal Medicine

## 2020-01-13 LAB — GLUCOSE, CAPILLARY
Glucose-Capillary: 115 mg/dL — ABNORMAL HIGH (ref 70–99)
Glucose-Capillary: 111 mg/dL — ABNORMAL HIGH (ref 70–99)
Glucose-Capillary: 238 mg/dL — ABNORMAL HIGH (ref 70–99)
Glucose-Capillary: 133 mg/dL — ABNORMAL HIGH (ref 70–99)
Glucose-Capillary: 169 mg/dL — ABNORMAL HIGH (ref 70–99)

## 2020-01-13 LAB — CBC WITH DIFFERENTIAL/PLATELET
Abs Immature Granulocytes: 0.01 10*3/uL (ref 0.00–0.07)
Basophils Absolute: 0 10*3/uL (ref 0.0–0.1)
Basophils Relative: 0 %
Eosinophils Absolute: 0.1 10*3/uL (ref 0.0–0.5)
Eosinophils Relative: 2 %
HCT: 34 % — ABNORMAL LOW (ref 39.0–52.0)
Hemoglobin: 10.7 g/dL — ABNORMAL LOW (ref 13.0–17.0)
Immature Granulocytes: 0 %
Lymphocytes Relative: 23 %
Lymphs Abs: 1.1 10*3/uL (ref 0.7–4.0)
MCH: 31.5 pg (ref 26.0–34.0)
MCHC: 31.5 g/dL (ref 30.0–36.0)
MCV: 100 fL (ref 80.0–100.0)
Monocytes Absolute: 0.5 10*3/uL (ref 0.1–1.0)
Monocytes Relative: 10 %
Neutro Abs: 3 10*3/uL (ref 1.7–7.7)
Neutrophils Relative %: 65 %
Platelets: 189 10*3/uL (ref 150–400)
RBC: 3.4 MIL/uL — ABNORMAL LOW (ref 4.22–5.81)
RDW: 13.2 % (ref 11.5–15.5)
WBC: 4.7 10*3/uL (ref 4.0–10.5)
nRBC: 0 % (ref 0.0–0.2)

## 2020-01-13 LAB — BASIC METABOLIC PANEL
Anion gap: 9 (ref 5–15)
BUN: 34 mg/dL — ABNORMAL HIGH (ref 8–23)
CO2: 25 mmol/L (ref 22–32)
Calcium: 8.5 mg/dL — ABNORMAL LOW (ref 8.9–10.3)
Chloride: 105 mmol/L (ref 98–111)
Creatinine, Ser: 2.43 mg/dL — ABNORMAL HIGH (ref 0.61–1.24)
GFR calc Af Amer: 30 mL/min — ABNORMAL LOW (ref >60)
GFR calc non Af Amer: 26 mL/min — ABNORMAL LOW (ref >60)
Glucose, Bld: 108 mg/dL — ABNORMAL HIGH (ref 70–99)
Potassium: 3.7 mmol/L (ref 3.5–5.1)
Sodium: 139 mmol/L (ref 135–145)

## 2020-01-13 LAB — MAGNESIUM: Magnesium: 1.8 mg/dL (ref 1.7–2.4)

## 2020-01-13 MED ORDER — CLONIDINE HCL 0.1 MG PO TABS
0.1000 mg | ORAL_TABLET | Freq: Three times a day (TID) | ORAL | Status: DC
  Administered 2020-01-13 – 2020-01-16 (×11): 0.1 mg via ORAL
  Filled 2020-01-13 (×11): qty 1

## 2020-01-13 NOTE — Evaluation (Signed)
Physical Therapy Evaluation Patient Details Name: Robert Chaney MRN: [id number] DOB: 01-12-1951 Today's Date: 01/13/2020   History of Present Illness  69 y.o. male with history h/o hypertension, hyperlipidemia, hypertrophic cardiomyopathy, NSVT/PSVT, insulin-dependent diabetes mellitus type 2, CKD stage III, chronic anemia, sleep apnea on CPAP nightly, presented to ED on 7/20 with complaints of dyspnea, lower extremity edema and chest pain. Troponin mildly elevated but now downtrending, suspect secondary to HTN and underlying CKD. Pt workup for HF exacerbation, HTN.  Clinical Impression   Pt presents with [name] strength, mild difficulty with higher level balance tasks, and decreased activity tolerance vs baseline. Pt to benefit from acute PT to address deficits. Pt ambulated hallway distance with no AD and supervision for safety, pt scored 21/24 which does not place pt in high fall risk category. After ambulation, pt with SpO2 82% on RA, requiring 2LO2 to recover to 90%. Pt in no distress, but pulse ox demonstrating good pleth and accurate HR. RN notified. PT to progress mobility as tolerated, and will continue to follow acutely.   BP post-session: 159/90   Follow Up Recommendations No PT follow up    Equipment Recommendations  None recommended by PT    Recommendations for Other Services       Precautions / Restrictions Precautions Precautions: None Precaution Comments: watch sats Restrictions Weight Bearing Restrictions: No      Mobility  Bed Mobility Overal bed mobility: Modified Independent             General bed mobility comments: mod I for increased time  Transfers Overall transfer level: Needs assistance Equipment used: None Transfers: Sit to/from Stand Sit to Stand: Supervision         General transfer comment: for safety  Ambulation/Gait Ambulation/Gait assistance: Supervision Gait Distance (Feet): 375 Feet Assistive device: None Gait Pattern/deviations:  Step-through pattern;Decreased stride length Gait velocity: slightly decr, able to increase to [facility] when requested by PT   General Gait Details: supervision for safety, very occasional unsteadiness when challenged (see balance section) but completely self-corrected.  Stairs Stairs: Yes Stairs assistance: Supervision Stair Management: No rails;Step to pattern;Forwards Number of Stairs: 4    Wheelchair Mobility    Modified Rankin (Stroke Patients Only)       Balance Overall balance assessment: Modified Independent                               Standardized Balance Assessment Standardized Balance Assessment : Dynamic Gait Index   Dynamic Gait Index Level Surface: Normal Change in Gait Speed: Normal Gait with Horizontal Head Turns: Normal Gait with Vertical Head Turns: Normal Gait and Pivot Turn: Mild Impairment Step Over Obstacle: Mild Impairment Step Around Obstacles: Normal Steps: Mild Impairment Total Score: 21       Pertinent Vitals/Pain Pain Assessment: No/denies pain    Home Living Family/patient expects to be discharged to:: Private residence Living Arrangements: Alone Available Help at Discharge: Family;Friend(s);Available PRN/intermittently Type of Home: Apartment Home Access: Level entry     Home Layout: One level Home Equipment: None      Prior Function Level of Independence: Independent         Comments: likes to read, listen to music, walk     Hand Dominance   Dominant Hand: Right    Extremity/Trunk Assessment   Upper Extremity Assessment Upper Extremity Assessment: Overall WFL for tasks assessed    Lower Extremity Assessment Lower Extremity Assessment: Overall WFL for tasks  assessed    Cervical / Trunk Assessment Cervical / Trunk Assessment: Normal  Communication   Communication: No difficulties  Cognition Arousal/Alertness: Awake/alert Behavior During Therapy: WFL for tasks assessed/performed Overall Cognitive  Status: Within Functional Limits for tasks assessed                                        General Comments      Exercises     Assessment/Plan    PT Assessment Patient needs continued PT services  PT Problem List Decreased activity tolerance;Cardiopulmonary status limiting activity;Decreased balance       PT Treatment Interventions Therapeutic activities;Gait training;Functional mobility training;Neuromuscular re-education;Balance training    PT Goals (Current goals can be found in the Care Plan section)  Acute Rehab PT Goals Patient Stated Goal: go home PT Goal Formulation: With patient Time For Goal Achievement: 01/27/20 Potential to Achieve Goals: Good    Frequency Min 3X/week   Barriers to discharge        Co-evaluation               AM-PAC PT "6 Clicks" Mobility  Outcome Measure Help needed turning from your back to your side while in a flat bed without using bedrails?: None Help needed moving from lying on your back to sitting on the side of a flat bed without using bedrails?: None Help needed moving to and from a bed to a chair (including a wheelchair)?: None Help needed standing up from a chair using your arms (e.g., wheelchair or bedside chair)?: None Help needed to walk in hospital room?: A Little Help needed climbing 3-5 steps with a railing? : A Little 6 Click Score: 22    End of Session Equipment Utilized During Treatment: Oxygen (O2 applied at end of session) Activity Tolerance: Patient tolerated treatment well Patient left: in chair;with call bell/phone within reach Nurse Communication: Mobility status PT Visit Diagnosis: Other abnormalities of gait and mobility (R26.89)    Time: 1115-1130 PT Time Calculation (min) (ACUTE ONLY): 15 min   Charges:   PT Evaluation $PT Eval Low Complexity: 1 Low          [name], PT Acute Rehabilitation Services Pager [phone]  Office [phone]   [name] [name] 01/13/2020, 12:10  PM

## 2020-01-13 NOTE — Progress Notes (Signed)
PROGRESS NOTE  Robert Chaney:[id number] DOB: 08-13-1950 DOA: 01/11/2020 PCP: [name], MD  HPI/Recap of past 24 hours: HPI from Dr [name] is a 69 y.o. male with history h/o hypertension, hyperlipidemia, hypertrophic cardiomyopathy, NSVT/PSVT, insulin-dependent diabetes mellitus type 2, CKD stage III, chronic anemia, sleep apnea on CPAP nightly, presented to ED with complaints of dyspnea, lower extremity edema and chest pain. He stated he checked pulse ox with his home monitor and was reading 60%.  On EMS arrival patient was noted to be hypertensive with blood pressure 215/110, heart rate 85, labored breathing, hypoxic with O2 sats 77% on room air and blood glucose of 251.  CPAP was applied with improved sats to 96%.  Patient reports noncompliance with CPAP at night (states ends up using about 4-5 times a week, feels his mask is not working) but claims compliance with blood pressure medications.  He reports worsening leg swellings over the last month.  A week and a half back he was evaluated by PCP and prescribed extra diuretic medication, when he did not have any significant improvement with the new diuretic, he called PCP office again 2 days back when he was told to double up the dosage.  Patient states his leg swellings did improve somewhat but yesterday he felt he was swollen again. In the ED, BP 220/96 on arrival, patient placed on nitroglycerin drip, BiPAP.  Patient admitted for further management.     Today, patient denies any new complaints, denies any chest pain, worsening shortness of breath.  Ambulated with PT and noted to desaturate to the 80s on room air requiring about 2 L of oxygen to recover to 90%.  BP still uncontrolled.    Assessment/Plan: Principal Problem:   Hypertensive emergency Active Problems:   Prolonged QT interval   Dyslipidemia   Hypertrophic cardiomyopathy (HCC)   Hyperglycemia   OSA (obstructive sleep apnea)   Type 2 diabetes mellitus  with stage 3a chronic kidney disease, with long-term current use of insulin (HCC)   Acute exacerbation of CHF (congestive heart failure) (HCC)   Acute respiratory failure (HCC)   Hypertensive crisis Status post nitroglycerin drip, BP still uncontrolled, although better than admission Continue home Coreg, clonidine po, hydralazine, Imdur Monitor closely  Acute hypoxic respiratory failure likely 2/2 acute diastolic HF History of HOCM No longer requiring BiPAP, still requiring about 2 L of O2 BNP 628 Troponin 63-61, likely due to demand ischemia EKG with no acute ST changes Chest x-ray showed cardiomegaly and bilateral lung opacities compatible with edema Echo showed prior history of HOCM, EF of 60 to 76%, grade 2 diastolic dysfunction, no regional wall motion abnormality Continue diuresis with Lasix Strict I's and O's, daily weights Monitor closely  Prolonged QTC Avoid QTC prolonging agents Replace electrolytes aggressively  Diabetes mellitus type 2 Last A1c in 10/2019 was 6 SSI, Lantus, hypoglycemic protocol, Accu-Cheks   CKD stage IIIb Baseline creatinine around 2-2.7 Monitor closely while diuresing Daily BMP  Hyperlipidemia Continue statins  Obstructive sleep apnea CPAP  Glaucoma Continue eyedrops       Malnutrition Type:      Malnutrition Characteristics:      Nutrition Interventions:       Estimated body mass index is 27.39 kg/m as calculated from the following:   Height as of this encounter: 5\' 8"  (1.727 m).   Weight as of this encounter: 81.7 kg.     Code Status: Full  Family Communication: Discussed extensively with patient  Disposition Plan:  Status is: Inpatient  Remains inpatient appropriate because:Inpatient level of care appropriate due to severity of illness   Dispo: The patient is from: Home              Anticipated d/c is to: Home              Anticipated d/c date is: 1 day              Patient currently is not medically  stable to d/c.     Consultants:  None  Procedures:  None  Antimicrobials:  None  DVT prophylaxis: Lovenox   Objective: Vitals:   01/13/20 0810 01/13/20 1128 01/13/20 1500 01/13/20 1633  BP: (!) 177/90 (!) 158/90  (!) 185/86  Pulse: 69 73  70  Resp:    20  Temp:  98.1 F (36.7 C)  98.6 F (37 C)  TempSrc: Oral Oral Oral Oral  SpO2: 93% 99%  95%  Weight:      Height:        Intake/Output Summary (Last 24 hours) at 01/13/2020 1732 Last data filed at 01/13/2020 0809 Gross per 24 hour  Intake 0 ml  Output 300 ml  Net -300 ml   Filed Weights   01/11/20 1953 01/12/20 0357 01/13/20 0500  Weight: 82.2 kg 82.2 kg 81.7 kg    Exam:  General: NAD   Cardiovascular: S1, S2 present  Respiratory: CTAB  Abdomen: Soft, nontender, nondistended, bowel sounds present  Musculoskeletal: No bilateral pedal edema noted  Skin: Normal  Psychiatry: Normal mood    Data Reviewed: CBC: Recent Labs  Lab 01/11/20 0545 01/12/20 1954 01/13/20 0352  WBC 7.9 4.9 4.7  NEUTROABS 5.7 3.4 3.0  HGB 11.4* 10.2* 10.7*  HCT 37.7* 32.1* 34.0*  MCV 104.4* 100.3* 100.0  PLT 216 190 654   Basic Metabolic Panel: Recent Labs  Lab 01/11/20 0545 01/11/20 0800 01/12/20 1954 01/13/20 0352  NA 137  --  136 139  K 4.6  --  4.0 3.7  CL 108  --  104 105  CO2 24  --  26 25  GLUCOSE 236*  --  179* 108*  BUN 34*  --  36* 34*  CREATININE 2.56*  --  2.65* 2.43*  CALCIUM 8.9  --  8.6* 8.5*  MG  --  1.9 1.7 1.8   GFR: Estimated Creatinine Clearance: 27.4 mL/min (A) (by C-G formula based on SCr of 2.43 mg/dL (H)). Liver Function Tests: Recent Labs  Lab 01/11/20 0545  AST 56*  ALT 33  ALKPHOS 102  BILITOT 0.8  PROT 7.4  ALBUMIN 3.1*   No results for input(s): LIPASE, AMYLASE in the last 168 hours. No results for input(s): AMMONIA in the last 168 hours. Coagulation Profile: No results for input(s): INR, PROTIME in the last 168 hours. Cardiac Enzymes: No results for  input(s): CKTOTAL, CKMB, CKMBINDEX, TROPONINI in the last 168 hours. BNP (last 3 results) No results for input(s): PROBNP in the last 8760 hours. HbA1C: No results for input(s): HGBA1C in the last 72 hours. CBG: Recent Labs  Lab 01/12/20 1153 01/12/20 1606 01/12/20 2202 01/13/20 0621 01/13/20 1133  GLUCAP 157* 141* 115* 111* 238*   Lipid Profile: No results for input(s): CHOL, HDL, LDLCALC, TRIG, CHOLHDL, LDLDIRECT in the last 72 hours. Thyroid Function Tests: Recent Labs    01/12/20 0445  TSH 2.415   Anemia Panel: No results for input(s): VITAMINB12, FOLATE, FERRITIN, TIBC, IRON, RETICCTPCT in the last 72 hours. Urine analysis:  Component Value Date/Time   COLORURINE YELLOW 07/31/2017 [place] 07/31/2017 1840   LABSPEC 1.020 07/31/2017 1840   PHURINE 6.0 07/31/2017 1840   GLUCOSEU >=500 (A) 07/31/2017 1840   HGBUR NEGATIVE 07/31/2017 1840   BILIRUBINUR NEGATIVE 07/31/2017 1840   KETONESUR 5 (A) 07/31/2017 [place] 07/31/2017 1840   UROBILINOGEN 1.0 07/19/2011 1335   NITRITE POSITIVE (A) 07/31/2017 1840   LEUKOCYTESUR NEGATIVE 07/31/2017 1840   Sepsis Labs: @LABRCNTIP (procalcitonin:4,lacticidven:4)  ) Recent Results (from the past 240 hour(s))  SARS Coronavirus 2 by RT PCR (hospital order, performed in [place] hospital lab) Nasopharyngeal Nasopharyngeal Swab     Status: None   Collection Time: 01/11/20  5:41 AM   Specimen: Nasopharyngeal Swab  Result Value Ref Range Status   SARS Coronavirus 2 NEGATIVE NEGATIVE Final    Comment: (NOTE) SARS-CoV-2 target nucleic acids are NOT DETECTED.  The SARS-CoV-2 RNA is generally detectable in upper and lower respiratory specimens during the acute phase of infection. The lowest concentration of SARS-CoV-2 viral copies this assay can detect is 250 copies / mL. A negative result does not preclude SARS-CoV-2 infection and should not be used as the sole basis for treatment or  other patient management decisions.  A negative result may occur with improper specimen collection / handling, submission of specimen other than nasopharyngeal swab, presence of viral mutation(s) within the areas targeted by this assay, and inadequate number of viral copies (<250 copies / mL). A negative result must be combined with clinical observations, patient history, and epidemiological information.  Fact Sheet for Patients:   StrictlyIdeas.no  Fact Sheet for Healthcare Providers: BankingDealers.co.za  This test is not yet approved or  cleared by the [place] FDA and has been authorized for detection and/or diagnosis of SARS-CoV-2 by FDA under an Emergency Use Authorization (EUA).  This EUA will remain in effect (meaning this test can be used) for the duration of the COVID-19 declaration under Section 564(b)(1) of the Act, 21 U.S.C. section 360bbb-3(b)(1), unless the authorization is terminated or revoked sooner.  Performed at [facility], [place] [address], [place], [place] [address]       Studies: No results found.  Scheduled Meds: . atorvastatin  20 mg Oral Daily  . brimonidine  1 drop Right Eye BID  . carvedilol  25 mg Oral BID  . cholecalciferol  1,000 Units Oral Daily  . cloNIDine  0.1 mg Oral TID  . enoxaparin (LOVENOX) injection  30 mg Subcutaneous Q24H  . furosemide  40 mg Intravenous BID  . hydrALAZINE  100 mg Oral BID  . insulin aspart  0-5 Units Subcutaneous QHS  . insulin aspart  0-9 Units Subcutaneous TID WC  . insulin glargine  15 Units Subcutaneous QHS  . isosorbide mononitrate  60 mg Oral Daily  . latanoprost  1 drop Left Eye QHS    Continuous Infusions: . nitroGLYCERIN Stopped (01/11/20 1833)     LOS: 2 days     Alma Friendly, MD [facility]  If 7PM-7AM, please contact night-coverage [email or web address] 01/13/2020, 5:32 PM

## 2020-01-14 ENCOUNTER — Inpatient Hospital Stay (HOSPITAL_COMMUNITY): Payer: PPO

## 2020-01-14 LAB — GLUCOSE, CAPILLARY
Glucose-Capillary: 107 mg/dL — ABNORMAL HIGH (ref 70–99)
Glucose-Capillary: 152 mg/dL — ABNORMAL HIGH (ref 70–99)
Glucose-Capillary: 118 mg/dL — ABNORMAL HIGH (ref 70–99)
Glucose-Capillary: 180 mg/dL — ABNORMAL HIGH (ref 70–99)

## 2020-01-14 LAB — CBC WITH DIFFERENTIAL/PLATELET
Abs Immature Granulocytes: 0.01 10*3/uL (ref 0.00–0.07)
Basophils Absolute: 0 10*3/uL (ref 0.0–0.1)
Basophils Relative: 1 %
Eosinophils Absolute: 0.1 10*3/uL (ref 0.0–0.5)
Eosinophils Relative: 2 %
HCT: 35 % — ABNORMAL LOW (ref 39.0–52.0)
Hemoglobin: 11.1 g/dL — ABNORMAL LOW (ref 13.0–17.0)
Immature Granulocytes: 0 %
Lymphocytes Relative: 25 %
Lymphs Abs: 1.1 10*3/uL (ref 0.7–4.0)
MCH: 31.5 pg (ref 26.0–34.0)
MCHC: 31.7 g/dL (ref 30.0–36.0)
MCV: 99.4 fL (ref 80.0–100.0)
Monocytes Absolute: 0.5 10*3/uL (ref 0.1–1.0)
Monocytes Relative: 11 %
Neutro Abs: 2.7 10*3/uL (ref 1.7–7.7)
Neutrophils Relative %: 61 %
Platelets: 192 10*3/uL (ref 150–400)
RBC: 3.52 MIL/uL — ABNORMAL LOW (ref 4.22–5.81)
RDW: 13.2 % (ref 11.5–15.5)
WBC: 4.3 10*3/uL (ref 4.0–10.5)
nRBC: 0 % (ref 0.0–0.2)

## 2020-01-14 LAB — BASIC METABOLIC PANEL
Anion gap: 8 (ref 5–15)
BUN: 40 mg/dL — ABNORMAL HIGH (ref 8–23)
CO2: 26 mmol/L (ref 22–32)
Calcium: 8.7 mg/dL — ABNORMAL LOW (ref 8.9–10.3)
Chloride: 102 mmol/L (ref 98–111)
Creatinine, Ser: 2.54 mg/dL — ABNORMAL HIGH (ref 0.61–1.24)
GFR calc Af Amer: 29 mL/min — ABNORMAL LOW (ref >60)
GFR calc non Af Amer: 25 mL/min — ABNORMAL LOW (ref >60)
Glucose, Bld: 154 mg/dL — ABNORMAL HIGH (ref 70–99)
Potassium: 3.7 mmol/L (ref 3.5–5.1)
Sodium: 136 mmol/L (ref 135–145)

## 2020-01-14 LAB — D-DIMER, QUANTITATIVE: D-Dimer, Quant: 0.73 ug/mL-FEU — ABNORMAL HIGH (ref 0.00–0.50)

## 2020-01-14 NOTE — Progress Notes (Signed)
Patient refused CPAP.

## 2020-01-14 NOTE — Progress Notes (Signed)
PROGRESS NOTE  Robert Chaney DGL:[id number] DOB: March 01, 1951 DOA: 01/11/2020 PCP: [name], MD  HPI/Recap of past 24 hours: HPI from Dr [name] is a 69 y.o. male with history h/o hypertension, hyperlipidemia, hypertrophic cardiomyopathy, NSVT/PSVT, insulin-dependent diabetes mellitus type 2, CKD stage III, chronic anemia, sleep apnea on CPAP nightly, presented to ED with complaints of dyspnea, lower extremity edema and chest pain. He stated he checked pulse ox with his home monitor and was reading 60%.  On EMS arrival patient was noted to be hypertensive with blood pressure 215/110, heart rate 85, labored breathing, hypoxic with O2 sats 77% on room air and blood glucose of 251.  CPAP was applied with improved sats to 96%.  Patient reports noncompliance with CPAP at night (states ends up using about 4-5 times a week, feels his mask is not working) but claims compliance with blood pressure medications.  He reports worsening leg swellings over the last month.  A week and a half back he was evaluated by PCP and prescribed extra diuretic medication, when he did not have any significant improvement with the new diuretic, he called PCP office again 2 days back when he was told to double up the dosage.  Patient states his leg swellings did improve somewhat but yesterday he felt he was swollen again. In the ED, BP 220/96 on arrival, patient placed on nitroglycerin drip, BiPAP.  Patient admitted for further management.     Today, patient denies any new complaints, noted to still desaturates to about 82% while ambulating. Denies any chest pain, abdominal pain, nausea/vomiting, fever/chills. Still somewhat short of breath on ambulation    Assessment/Plan: Principal Problem:   Hypertensive emergency Active Problems:   Prolonged QT interval   Dyslipidemia   Hypertrophic cardiomyopathy (HCC)   Hyperglycemia   OSA (obstructive sleep apnea)   Type 2 diabetes mellitus with stage 3a chronic  kidney disease, with long-term current use of insulin (HCC)   Acute exacerbation of CHF (congestive heart failure) (HCC)   Acute respiratory failure (HCC)   Hypertensive crisis BP with better control Status post nitroglycerin drip Continue home Coreg, clonidine po, hydralazine, Imdur Monitor closely  Acute hypoxic respiratory failure likely 2/2 acute diastolic HF History of HOCM No longer requiring BiPAP, still requiring about 2 L of O2, and desaturating to the low 80s on ambulation BNP 628 Troponin 63-61, likely due to demand ischemia EKG with no acute ST changes Chest x-ray showed cardiomegaly and bilateral lung opacities compatible with edema Echo showed prior history of HOCM, EF of 60 to 51%, grade 2 diastolic dysfunction, no regional wall motion abnormality Bilateral Doppler to rule out DVT since patient has bilateral lower extremity edema VQ scan pending Continue diuresis with Lasix Strict I's and O's, daily weights Monitor closely  Prolonged QTC Avoid QTC prolonging agents Replace electrolytes aggressively  Diabetes mellitus type 2 Last A1c in 10/2019 was 6 SSI, Lantus, hypoglycemic protocol, Accu-Cheks   CKD stage IIIb Baseline creatinine around 2-2.7 Monitor closely while diuresing Daily BMP  Hyperlipidemia Continue statins  Obstructive sleep apnea CPAP  Glaucoma Continue eyedrops       Malnutrition Type:      Malnutrition Characteristics:      Nutrition Interventions:       Estimated body mass index is 26.85 kg/m as calculated from the following:   Height as of this encounter: 5\' 8"  (1.727 m).   Weight as of this encounter: 80.1 kg.     Code Status: Full  Family Communication: Discussed extensively with patient  Disposition Plan: Status is: Inpatient  Remains inpatient appropriate because:Inpatient level of care appropriate due to severity of illness   Dispo: The patient is from: Home              Anticipated d/c is to: Home               Anticipated d/c date is: 1 day              Patient currently is not medically stable to d/c.     Consultants:  None  Procedures:  None  Antimicrobials:  None  DVT prophylaxis: Lovenox   Objective: Vitals:   01/14/20 0501 01/14/20 0736 01/14/20 1339 01/14/20 1700  BP: (!) 164/76 (!) 176/88 (!) 134/71 (!) 166/83  Pulse:  (!) 112 63 63  Resp:  16    Temp:  97.8 F (36.6 C) 98.1 F (36.7 C) 98.3 F (36.8 C)  TempSrc:  Oral Oral Oral  SpO2:  92% 95% 99%  Weight: 80.1 kg     Height:        Intake/Output Summary (Last 24 hours) at 01/14/2020 1719 Last data filed at 01/14/2020 0916 Gross per 24 hour  Intake 240 ml  Output --  Net 240 ml   Filed Weights   01/12/20 0357 01/13/20 0500 01/14/20 0501  Weight: 82.2 kg 81.7 kg 80.1 kg    Exam:  General: NAD   Cardiovascular: S1, S2 present  Respiratory: CTAB  Abdomen: Soft, nontender, nondistended, bowel sounds present  Musculoskeletal: 1+ bilateral pedal edema noted  Skin: Normal  Psychiatry: Normal mood    Data Reviewed: CBC: Recent Labs  Lab 01/11/20 0545 01/12/20 1954 01/13/20 0352 01/14/20 0430  WBC 7.9 4.9 4.7 4.3  NEUTROABS 5.7 3.4 3.0 2.7  HGB 11.4* 10.2* 10.7* 11.1*  HCT 37.7* 32.1* 34.0* 35.0*  MCV 104.4* 100.3* 100.0 99.4  PLT 216 190 189 998   Basic Metabolic Panel: Recent Labs  Lab 01/11/20 0545 01/11/20 0800 01/12/20 1954 01/13/20 0352 01/14/20 0430  NA 137  --  136 139 136  K 4.6  --  4.0 3.7 3.7  CL 108  --  104 105 102  CO2 24  --  26 25 26   GLUCOSE 236*  --  179* 108* 154*  BUN 34*  --  36* 34* 40*  CREATININE 2.56*  --  2.65* 2.43* 2.54*  CALCIUM 8.9  --  8.6* 8.5* 8.7*  MG  --  1.9 1.7 1.8  --    GFR: Estimated Creatinine Clearance: 26.2 mL/min (A) (by C-G formula based on SCr of 2.54 mg/dL (H)). Liver Function Tests: Recent Labs  Lab 01/11/20 0545  AST 56*  ALT 33  ALKPHOS 102  BILITOT 0.8  PROT 7.4  ALBUMIN 3.1*   No results for  input(s): LIPASE, AMYLASE in the last 168 hours. No results for input(s): AMMONIA in the last 168 hours. Coagulation Profile: No results for input(s): INR, PROTIME in the last 168 hours. Cardiac Enzymes: No results for input(s): CKTOTAL, CKMB, CKMBINDEX, TROPONINI in the last 168 hours. BNP (last 3 results) No results for input(s): PROBNP in the last 8760 hours. HbA1C: No results for input(s): HGBA1C in the last 72 hours. CBG: Recent Labs  Lab 01/13/20 1631 01/13/20 2219 01/14/20 0618 01/14/20 1344 01/14/20 1704  GLUCAP 133* 169* 107* 152* 118*   Lipid Profile: No results for input(s): CHOL, HDL, LDLCALC, TRIG, CHOLHDL, LDLDIRECT in the last 72 hours. Thyroid  Function Tests: Recent Labs    01/12/20 0445  TSH 2.415   Anemia Panel: No results for input(s): VITAMINB12, FOLATE, FERRITIN, TIBC, IRON, RETICCTPCT in the last 72 hours. Urine analysis:    Component Value Date/Time   COLORURINE YELLOW 07/31/2017 [place] 07/31/2017 1840   LABSPEC 1.020 07/31/2017 1840   PHURINE 6.0 07/31/2017 1840   GLUCOSEU >=500 (A) 07/31/2017 1840   HGBUR NEGATIVE 07/31/2017 1840   BILIRUBINUR NEGATIVE 07/31/2017 1840   KETONESUR 5 (A) 07/31/2017 1840   PROTEINUR NEGATIVE 07/31/2017 1840   UROBILINOGEN 1.0 07/19/2011 1335   NITRITE POSITIVE (A) 07/31/2017 1840   LEUKOCYTESUR NEGATIVE 07/31/2017 1840   Sepsis Labs: @LABRCNTIP (procalcitonin:4,lacticidven:4)  ) Recent Results (from the past 240 hour(s))  SARS Coronavirus 2 by RT PCR (hospital order, performed in [place] hospital lab) Nasopharyngeal Nasopharyngeal Swab     Status: None   Collection Time: 01/11/20  5:41 AM   Specimen: Nasopharyngeal Swab  Result Value Ref Range Status   SARS Coronavirus 2 NEGATIVE NEGATIVE Final    Comment: (NOTE) SARS-CoV-2 target nucleic acids are NOT DETECTED.  The SARS-CoV-2 RNA is generally detectable in upper and lower respiratory specimens during the acute phase of  infection. The lowest concentration of SARS-CoV-2 viral copies this assay can detect is 250 copies / mL. A negative result does not preclude SARS-CoV-2 infection and should not be used as the sole basis for treatment or other patient management decisions.  A negative result may occur with improper specimen collection / handling, submission of specimen other than nasopharyngeal swab, presence of viral mutation(s) within the areas targeted by this assay, and inadequate number of viral copies (<250 copies / mL). A negative result must be combined with clinical observations, patient history, and epidemiological information.  Fact Sheet for Patients:   StrictlyIdeas.no  Fact Sheet for Healthcare Providers: BankingDealers.co.za  This test is not yet approved or  cleared by the [place] FDA and has been authorized for detection and/or diagnosis of SARS-CoV-2 by FDA under an Emergency Use Authorization (EUA).  This EUA will remain in effect (meaning this test can be used) for the duration of the COVID-19 declaration under Section 564(b)(1) of the Act, 21 U.S.C. section 360bbb-3(b)(1), unless the authorization is terminated or revoked sooner.  Performed at [facility], [place] [address] [place], [place] [address]       Studies: No results found.  Scheduled Meds: . atorvastatin  20 mg Oral Daily  . brimonidine  1 drop Right Eye BID  . carvedilol  25 mg Oral BID  . cholecalciferol  1,000 Units Oral Daily  . cloNIDine  0.1 mg Oral TID  . enoxaparin (LOVENOX) injection  30 mg Subcutaneous Q24H  . furosemide  40 mg Intravenous BID  . hydrALAZINE  100 mg Oral BID  . insulin aspart  0-5 Units Subcutaneous QHS  . insulin aspart  0-9 Units Subcutaneous TID WC  . insulin glargine  15 Units Subcutaneous QHS  . isosorbide mononitrate  60 mg Oral Daily  . latanoprost  1 drop Left Eye QHS    Continuous Infusions: . nitroGLYCERIN  Stopped (01/11/20 1833)     LOS: 3 days     Alma Friendly, MD [facility]  If 7PM-7AM, please contact night-coverage [email or web address] 01/14/2020, 5:19 PM

## 2020-01-14 NOTE — Progress Notes (Signed)
Lower extremity venous bilateral study completed.   See Cv Proc for preliminary results.   Athleen Feltner  

## 2020-01-14 NOTE — Progress Notes (Signed)
SATURATION QUALIFICATIONS: (This note is used to comply with regulatory documentation for home oxygen)  Patient Saturations on Room Air at Rest = 90%  Patient Saturations on Room Air while Ambulating = 82%  Patient Saturations on 2 Liters of oxygen while Ambulating = 94%  Please briefly explain why patient needs home oxygen: Pt desaturates to 82% while ambulating. Patient needs 2L of oxygen to maintain a saturation of at least 90% while ambulating.

## 2020-01-15 ENCOUNTER — Inpatient Hospital Stay (HOSPITAL_COMMUNITY): Payer: PPO

## 2020-01-15 LAB — GLUCOSE, CAPILLARY
Glucose-Capillary: 118 mg/dL — ABNORMAL HIGH (ref 70–99)
Glucose-Capillary: 165 mg/dL — ABNORMAL HIGH (ref 70–99)
Glucose-Capillary: 123 mg/dL — ABNORMAL HIGH (ref 70–99)
Glucose-Capillary: 168 mg/dL — ABNORMAL HIGH (ref 70–99)

## 2020-01-15 LAB — BASIC METABOLIC PANEL
Anion gap: 8 (ref 5–15)
BUN: 47 mg/dL — ABNORMAL HIGH (ref 8–23)
CO2: 26 mmol/L (ref 22–32)
Calcium: 8.5 mg/dL — ABNORMAL LOW (ref 8.9–10.3)
Chloride: 101 mmol/L (ref 98–111)
Creatinine, Ser: 3.29 mg/dL — ABNORMAL HIGH (ref 0.61–1.24)
GFR calc Af Amer: 21 mL/min — ABNORMAL LOW (ref >60)
GFR calc non Af Amer: 18 mL/min — ABNORMAL LOW (ref >60)
Glucose, Bld: 126 mg/dL — ABNORMAL HIGH (ref 70–99)
Potassium: 3.7 mmol/L (ref 3.5–5.1)
Sodium: 135 mmol/L (ref 135–145)

## 2020-01-15 MED ORDER — TECHNETIUM TO 99M ALBUMIN AGGREGATED
3.9500 | Freq: Once | INTRAVENOUS | Status: AC | PRN
  Administered 2020-01-15: 3.95 via INTRAVENOUS

## 2020-01-15 NOTE — Progress Notes (Signed)
SATURATION QUALIFICATIONS: (This note is used to comply with regulatory documentation for home oxygen)  Patient Saturations on Room Air at Rest = 100%  Patient Saturations on Room Air while Ambulating = 85%  Patient Saturations on 2 Liters of oxygen while Ambulating = 92%  Please briefly explain why patient needs home oxygen:

## 2020-01-15 NOTE — Progress Notes (Signed)
PROGRESS NOTE  Robert Chaney JJK:[id number] DOB: 1950-10-13 DOA: 01/11/2020 PCP: [name], MD  HPI/Recap of past 24 hours: HPI from Dr Robert Chaney is a 69 y.o. male with history h/o hypertension, hyperlipidemia, hypertrophic cardiomyopathy, NSVT/PSVT, insulin-dependent diabetes mellitus type 2, CKD stage III, chronic anemia, sleep apnea on CPAP nightly, presented to ED with complaints of dyspnea, lower extremity edema and chest pain. He stated he checked pulse ox with his home monitor and was reading 60%.  On EMS arrival patient was noted to be hypertensive with blood pressure 215/110, heart rate 85, labored breathing, hypoxic with O2 sats 77% on room air and blood glucose of 251.  CPAP was applied with improved sats to 96%.  Patient reports noncompliance with CPAP at night (states ends up using about 4-5 times a week, feels his mask is not working) but claims compliance with blood pressure medications.  He reports worsening leg swellings over the last month.  A week and a half back he was evaluated by PCP and prescribed extra diuretic medication, when he did not have any significant improvement with the new diuretic, he called PCP office again 2 days back when he was told to double up the dosage.  Patient states his leg swellings did improve somewhat but yesterday he felt he was swollen again. In the ED, BP 220/96 on arrival, patient placed on nitroglycerin drip, BiPAP.  Patient admitted for further management.     Today, patient denied any new complaints, still noted to desaturate to 85% while ambulating.  Denies any chest pain, abdominal pain, nausea/vomiting, fever/chills.    Assessment/Plan: Principal Problem:   Hypertensive emergency Active Problems:   Prolonged QT interval   Dyslipidemia   Hypertrophic cardiomyopathy (HCC)   Hyperglycemia   OSA (obstructive sleep apnea)   Type 2 diabetes mellitus with stage 3a chronic kidney disease, with long-term current use of  insulin (HCC)   Acute exacerbation of CHF (congestive heart failure) (HCC)   Acute respiratory failure (HCC)   Hypertensive crisis BP with better control Status post nitroglycerin drip Continue home Coreg, clonidine po, hydralazine, Imdur Monitor closely  Acute hypoxic respiratory failure likely 2/2 acute diastolic HF History of HOCM No longer requiring BiPAP, still requiring about 2 L of O2, and desaturating to the 80s on ambulation BNP 628 Troponin 63-61, likely due to demand ischemia EKG with no acute ST changes Chest x-ray showed cardiomegaly and bilateral lung opacities compatible with edema Echo showed prior history of HOCM, EF of 60 to 37%, grade 2 diastolic dysfunction, no regional wall motion abnormality Bilateral Doppler showed no evidence of DVT in the bilateral lower extremity, but noted femoral arterial occlusion, flow reconstitutes at popliteal artery, will speak to vascular surgery  VQ scan pending Hold diuresis with Lasix as creatinine bumped up Strict I's and O's, daily weights Monitor closely  Prolonged QTC Avoid QTC prolonging agents Replace electrolytes aggressively  Diabetes mellitus type 2 Last A1c in 10/2019 was 6 SSI, Lantus, hypoglycemic protocol, Accu-Cheks   AKI on CKD stage IIIb Baseline creatinine around 2-2.7, creatinine bumped to 3.29 Lasix held Daily BMP  Hyperlipidemia Continue statins  Obstructive sleep apnea CPAP  Glaucoma Continue eyedrops       Malnutrition Type:      Malnutrition Characteristics:      Nutrition Interventions:       Estimated body mass index is 27.12 kg/m as calculated from the following:   Height as of this encounter: 5\' 8"  (1.727 m).  Weight as of this encounter: 80.9 kg.     Code Status: Full  Family Communication: Discussed extensively with patient  Disposition Plan: Status is: Inpatient  Remains inpatient appropriate because:Inpatient level of care appropriate due to severity of  illness   Dispo: The patient is from: Home              Anticipated d/c is to: Home              Anticipated d/c date is: 1 day              Patient currently is not medically stable to d/c.     Consultants:  None  Procedures:  None  Antimicrobials:  None  DVT prophylaxis: Lovenox   Objective: Vitals:   01/15/20 1226 01/15/20 1300 01/15/20 1400 01/15/20 1500  BP: (!) 137/68     Pulse: 62  65 55  Resp:  16 18 15   Temp: 98.2 F (36.8 C)     TempSrc: Oral     SpO2: 100%  99% 100%  Weight:      Height:        Intake/Output Summary (Last 24 hours) at 01/15/2020 1637 Last data filed at 01/14/2020 2100 Gross per 24 hour  Intake --  Output 300 ml  Net -300 ml   Filed Weights   01/13/20 0500 01/14/20 0501 01/15/20 0430  Weight: 81.7 kg 80.1 kg 80.9 kg    Exam:  General: NAD   Cardiovascular: S1, S2 present  Respiratory: CTAB  Abdomen: Soft, nontender, nondistended, bowel sounds present  Musculoskeletal: 1+ bilateral pedal edema noted  Skin: Normal  Psychiatry: Normal mood    Data Reviewed: CBC: Recent Labs  Lab 01/11/20 0545 01/12/20 1954 01/13/20 0352 01/14/20 0430  WBC 7.9 4.9 4.7 4.3  NEUTROABS 5.7 3.4 3.0 2.7  HGB 11.4* 10.2* 10.7* 11.1*  HCT 37.7* 32.1* 34.0* 35.0*  MCV 104.4* 100.3* 100.0 99.4  PLT 216 190 189 983   Basic Metabolic Panel: Recent Labs  Lab 01/11/20 0545 01/11/20 0800 01/12/20 1954 01/13/20 0352 01/14/20 0430 01/15/20 0410  NA 137  --  136 139 136 135  K 4.6  --  4.0 3.7 3.7 3.7  CL 108  --  104 105 102 101  CO2 24  --  26 25 26 26   GLUCOSE 236*  --  179* 108* 154* 126*  BUN 34*  --  36* 34* 40* 47*  CREATININE 2.56*  --  2.65* 2.43* 2.54* 3.29*  CALCIUM 8.9  --  8.6* 8.5* 8.7* 8.5*  MG  --  1.9 1.7 1.8  --   --    GFR: Estimated Creatinine Clearance: 20.2 mL/min (A) (by C-G formula based on SCr of 3.29 mg/dL (H)). Liver Function Tests: Recent Labs  Lab 01/11/20 0545  AST 56*  ALT 33  ALKPHOS  102  BILITOT 0.8  PROT 7.4  ALBUMIN 3.1*   No results for input(s): LIPASE, AMYLASE in the last 168 hours. No results for input(s): AMMONIA in the last 168 hours. Coagulation Profile: No results for input(s): INR, PROTIME in the last 168 hours. Cardiac Enzymes: No results for input(s): CKTOTAL, CKMB, CKMBINDEX, TROPONINI in the last 168 hours. BNP (last 3 results) No results for input(s): PROBNP in the last 8760 hours. HbA1C: No results for input(s): HGBA1C in the last 72 hours. CBG: Recent Labs  Lab 01/14/20 1344 01/14/20 1704 01/14/20 2211 01/15/20 0614 01/15/20 1223  GLUCAP 152* 118* 180* 118* 165*   Lipid  Profile: No results for input(s): CHOL, HDL, LDLCALC, TRIG, CHOLHDL, LDLDIRECT in the last 72 hours. Thyroid Function Tests: No results for input(s): TSH, T4TOTAL, FREET4, T3FREE, THYROIDAB in the last 72 hours. Anemia Panel: No results for input(s): VITAMINB12, FOLATE, FERRITIN, TIBC, IRON, RETICCTPCT in the last 72 hours. Urine analysis:    Component Value Date/Time   COLORURINE YELLOW 07/31/2017 [place] 07/31/2017 1840   LABSPEC 1.020 07/31/2017 1840   PHURINE 6.0 07/31/2017 1840   GLUCOSEU >=500 (A) 07/31/2017 1840   HGBUR NEGATIVE 07/31/2017 1840   BILIRUBINUR NEGATIVE 07/31/2017 1840   KETONESUR 5 (A) 07/31/2017 1840   PROTEINUR NEGATIVE 07/31/2017 1840   UROBILINOGEN 1.0 07/19/2011 1335   NITRITE POSITIVE (A) 07/31/2017 1840   LEUKOCYTESUR NEGATIVE 07/31/2017 1840   Sepsis Labs: @LABRCNTIP (procalcitonin:4,lacticidven:4)  ) Recent Results (from the past 240 hour(s))  SARS Coronavirus 2 by RT PCR (hospital order, performed in [facility] hospital lab) Nasopharyngeal Nasopharyngeal Swab     Status: None   Collection Time: 01/11/20  5:41 AM   Specimen: Nasopharyngeal Swab  Result Value Ref Range Status   SARS Coronavirus 2 NEGATIVE NEGATIVE Final    Comment: (NOTE) SARS-CoV-2 target nucleic acids are NOT DETECTED.  The SARS-CoV-2  RNA is generally detectable in upper and lower respiratory specimens during the acute phase of infection. The lowest concentration of SARS-CoV-2 viral copies this assay can detect is 250 copies / mL. A negative result does not preclude SARS-CoV-2 infection and should not be used as the sole basis for treatment or other patient management decisions.  A negative result may occur with improper specimen collection / handling, submission of specimen other than nasopharyngeal swab, presence of viral mutation(s) within the areas targeted by this assay, and inadequate number of viral copies (<250 copies / mL). A negative result must be combined with clinical observations, patient history, and epidemiological information.  Fact Sheet for Patients:   StrictlyIdeas.no  Fact Sheet for Healthcare Providers: BankingDealers.co.za  This test is not yet approved or  cleared by the [place] FDA and has been authorized for detection and/or diagnosis of SARS-CoV-2 by FDA under an Emergency Use Authorization (EUA).  This EUA will remain in effect (meaning this test can be used) for the duration of the COVID-19 declaration under Section 564(b)(1) of the Act, 21 U.S.C. section 360bbb-3(b)(1), unless the authorization is terminated or revoked sooner.  Performed at [facility], [place] [address], [place], [place] [address]       Studies: DG Chest 2 View  Result Date: 01/15/2020 CLINICAL DATA:  69 year old male with history of difficulty breathing. EXAM: CHEST - 2 VIEW COMPARISON:  Chest x-ray 01/11/2020. FINDINGS: Lung volumes are normal. No consolidative airspace disease. No pleural effusions. Cephalization of the pulmonary vasculature, without frank pulmonary edema. Small nodular density measuring 1 cm projecting over the lower left lung, new compared to the prior study. No pneumothorax. Heart size is mildly enlarged. Upper mediastinal contours are  within normal limits. Aortic atherosclerosis. IMPRESSION: 1. Cardiomegaly with pulmonary venous congestion, but no frank pulmonary edema. 2. Aortic atherosclerosis. 3. New nodular density projecting over the left lower lung. Repeat standing PA and lateral chest radiograph is recommended in 1-2 months to ensure the stability or resolution of this finding. Electronically Signed   By: Vinnie Langton M.D.   On: 01/15/2020 11:12   VAS [place] LOWER EXTREMITY VENOUS (DVT)  Result Date: 01/15/2020  Lower Venous DVTStudy Indications: Swelling, and SOB.  Comparison Study: No prior  studies. Performing Technologist: Darlin Coco  Examination Guidelines: A complete evaluation includes B-mode imaging, spectral Doppler, color Doppler, and power Doppler as needed of all accessible portions of each vessel. Bilateral testing is considered an integral part of a complete examination. Limited examinations for reoccurring indications may be performed as noted. The reflux portion of the exam is performed with the patient in reverse Trendelenburg.  +---------+---------------+---------+-----------+----------+--------------+ RIGHT    CompressibilityPhasicitySpontaneityPropertiesThrombus Aging +---------+---------------+---------+-----------+----------+--------------+ CFV      Full           Yes      Yes                                 +---------+---------------+---------+-----------+----------+--------------+ SFJ      Full                                                        +---------+---------------+---------+-----------+----------+--------------+ FV Prox  Full                                                        +---------+---------------+---------+-----------+----------+--------------+ FV Mid   Full                                                        +---------+---------------+---------+-----------+----------+--------------+ FV DistalFull                                                         +---------+---------------+---------+-----------+----------+--------------+ PFV      Full                                                        +---------+---------------+---------+-----------+----------+--------------+ POP      Full           Yes      Yes                                 +---------+---------------+---------+-----------+----------+--------------+ PTV      Full                                                        +---------+---------------+---------+-----------+----------+--------------+ PERO     Full                                                        +---------+---------------+---------+-----------+----------+--------------+   +---------+---------------+---------+-----------+----------+--------------+  LEFT     CompressibilityPhasicitySpontaneityPropertiesThrombus Aging +---------+---------------+---------+-----------+----------+--------------+ CFV      Full           Yes      Yes                                 +---------+---------------+---------+-----------+----------+--------------+ SFJ      Full                                                        +---------+---------------+---------+-----------+----------+--------------+ FV Prox  Full                                                        +---------+---------------+---------+-----------+----------+--------------+ FV Mid   Full                                                        +---------+---------------+---------+-----------+----------+--------------+ FV DistalFull                                                        +---------+---------------+---------+-----------+----------+--------------+ PFV      Full                                                        +---------+---------------+---------+-----------+----------+--------------+ POP      Full           Yes      Yes                                  +---------+---------------+---------+-----------+----------+--------------+ PTV      Full                                                        +---------+---------------+---------+-----------+----------+--------------+ PERO     Full                                                        +---------+---------------+---------+-----------+----------+--------------+     Summary: RIGHT: - There is no evidence of deep vein thrombosis in the lower extremity.  - No cystic structure found in the popliteal fossa. - Incidental: Femoral artery occlusion. Flow reconstitutes at popliteal artery.  LEFT: - There is no evidence of  deep vein thrombosis in the lower extremity.  - No cystic structure found in the popliteal fossa. - Incidental: Femoral artery occlusion. Flow reconstitutes at popliteal artery.  *See table(s) above for measurements and observations. Electronically signed by Monica Martinez MD on 01/15/2020 at 11:55:43 AM.    Final     Scheduled Meds: . atorvastatin  20 mg Oral Daily  . brimonidine  1 drop Right Eye BID  . carvedilol  25 mg Oral BID  . cholecalciferol  1,000 Units Oral Daily  . cloNIDine  0.1 mg Oral TID  . enoxaparin (LOVENOX) injection  30 mg Subcutaneous Q24H  . hydrALAZINE  100 mg Oral BID  . insulin aspart  0-5 Units Subcutaneous QHS  . insulin aspart  0-9 Units Subcutaneous TID WC  . insulin glargine  15 Units Subcutaneous QHS  . isosorbide mononitrate  60 mg Oral Daily  . latanoprost  1 drop Left Eye QHS    Continuous Infusions: . nitroGLYCERIN Stopped (01/11/20 1833)     LOS: 4 days     Alma Friendly, MD [facility]  If 7PM-7AM, please contact night-coverage [email or web address] 01/15/2020, 4:37 PM

## 2020-01-16 DIAGNOSIS — I421 Obstructive hypertrophic cardiomyopathy: Secondary | ICD-10-CM

## 2020-01-16 LAB — GLUCOSE, CAPILLARY
Glucose-Capillary: 98 mg/dL (ref 70–99)
Glucose-Capillary: 112 mg/dL — ABNORMAL HIGH (ref 70–99)

## 2020-01-16 LAB — BASIC METABOLIC PANEL
Anion gap: 8 (ref 5–15)
BUN: 48 mg/dL — ABNORMAL HIGH (ref 8–23)
CO2: 27 mmol/L (ref 22–32)
Calcium: 8.6 mg/dL — ABNORMAL LOW (ref 8.9–10.3)
Chloride: 102 mmol/L (ref 98–111)
Creatinine, Ser: 3 mg/dL — ABNORMAL HIGH (ref 0.61–1.24)
GFR calc Af Amer: 23 mL/min — ABNORMAL LOW (ref >60)
GFR calc non Af Amer: 20 mL/min — ABNORMAL LOW (ref >60)
Glucose, Bld: 110 mg/dL — ABNORMAL HIGH (ref 70–99)
Potassium: 3.8 mmol/L (ref 3.5–5.1)
Sodium: 137 mmol/L (ref 135–145)

## 2020-01-16 MED ORDER — FUROSEMIDE 20 MG PO TABS
40.0000 mg | ORAL_TABLET | Freq: Every day | ORAL | 0 refills | Status: DC
Start: 2020-01-16 — End: 2020-03-16

## 2020-01-16 NOTE — Discharge Summary (Signed)
Discharge Summary  Robert Chaney HXT:[id number] DOB: 1951/03/20  PCP: Robert Ebbs, MD  Admit date: 01/11/2020 Discharge date: 01/16/2020  Time spent: 40 mins  Recommendations for Outpatient Follow-up:  1. PCP in 1 week-with repeat labs 2. Cardiology as scheduled    Discharge Diagnoses:  Active Hospital Problems   Diagnosis Date Noted  . Hypertensive emergency 01/11/2020  . Acute exacerbation of CHF (congestive heart failure) ([place]) 01/11/2020  . Acute respiratory failure ([place]) 01/11/2020  . Type 2 diabetes mellitus with stage 3a chronic kidney disease, with long-term current use of insulin ([place]) 07/16/2019  . OSA (obstructive sleep apnea) 03/30/2019  . Hyperglycemia 07/31/2017  . Hypertrophic cardiomyopathy ([place]) 07/26/2015  . Dyslipidemia 05/19/2013  . Prolonged QT interval 07/19/2011    Resolved Hospital Problems  No resolved problems to display.    Discharge Condition: Stable  Diet recommendation: Heart healthy/moderate carb  Vitals:   01/16/20 1014 01/16/20 1221  BP: (!) 149/64 (!) 150/69  Pulse:  68  Resp:  20  Temp:  98.5 F (36.9 C)  SpO2:  100%    History of present illness:  Robert Carol Coleis a 68 y.o.malewith history h/ohypertension, hyperlipidemia, hypertrophic cardiomyopathy, NSVT/PSVT, insulin-dependent diabetes mellitus type 2, CKD stage III, chronic anemia, sleep apnea on CPAP nightly,presented to ED with complaints of dyspnea, lower extremity edema and chest pain. He stated he checked pulse ox with his home monitor and was reading 60%. On EMS arrival patient was noted to be hypertensive with blood pressure 215/110, heart rate 85, labored breathing, hypoxic with O2 sats 77% on room air and blood glucose of 251. CPAP was applied with improved sats to 96%. Patient reports noncompliance with CPAP at night (states ends up using about 4-5 times a week, feels his mask is not working) but claims compliance with blood pressure medications. He reports  worsening leg swellings over the last month. A week and a half back he was evaluated by PCP and prescribed extra diuretic medication, when he did not have any significant improvement with the new diuretic, he called PCP office again 2 days back when he was told to double up the dosage. Patient states his leg swellings did improve somewhat but yesterday he felt he was swollen again. In the ED, BP 220/96 on arrival, patient placed on nitroglycerin drip, BiPAP.  Patient admitted for further management.    Today, patient denies any new complaints.  Discussed extensively with patient to be compliant with medications and newly prescribed home O2 as oxygen saturations continue to drop to the mid 80s on ambulation.  Cardiology consulted with no further recommendation.  Patient advised to be compliant with his appointments, and follow-up with PCP in 1 week with repeat labs.  Patient encouraged to continue to use his CPAP machine.    Hospital Course:  Principal Problem:   Hypertensive emergency Active Problems:   Prolonged QT interval   Dyslipidemia   Hypertrophic cardiomyopathy (HCC)   Hyperglycemia   OSA (obstructive sleep apnea)   Type 2 diabetes mellitus with stage 3a chronic kidney disease, with long-term current use of insulin (HCC)   Acute exacerbation of CHF (congestive heart failure) (HCC)   Acute respiratory failure (HCC)   Hypertensive crisis BP with better control Status post nitroglycerin drip Continue home Coreg, clonidine po, hydralazine, Imdur, Norvasc Follow-up with PCP  Acute hypoxic respiratory failure likely 2/2 acute diastolic HF History of HOCM Cardiorenal syndrome Noted to still be desaturating to the 80s on ambulation BNP 628 Troponin 63-61, likely due  to demand ischemia EKG with no acute ST changes Chest x-ray showed cardiomegaly and bilateral lung opacities compatible with edema Echo showed prior history of HOCM, EF of 60 to 34%, grade 2 diastolic dysfunction,  no regional wall motion abnormality Cardiology consulted, no further recommendations, discharged on p.o. Lasix 40 mg daily Bilateral Doppler showed no evidence of DVT in the bilateral lower extremity, but noted femoral arterial occlusion, flow reconstitutes at popliteal artery, spoke to vascular surgery Robert Chaney on 01/16/2020, no further recommendations as patient is completely asymptomatic.  Patient allergic to aspirin VQ scan with no worrisome perfusion defects Follow-up with cardiology, PCP  Prolonged QTC Avoid QTC prolonging agents  Diabetes mellitus type 2 Last A1c in 10/2019 was 6 Continue home regimen   AKI on CKD stage IIIb Baseline creatinine around 2-2.7, creatinine bumped to 3.29 Follow-up with PCP, with repeat labs  Hyperlipidemia Continue statins  Obstructive sleep apnea CPAP  Glaucoma Continue eyedrops          Malnutrition Type:      Malnutrition Characteristics:      Nutrition Interventions:      Estimated body mass index is 27.08 kg/m as calculated from the following:   Height as of this encounter: 5\' 8"  (1.727 m).   Weight as of this encounter: 80.8 kg.    Procedures:  None  Consultations:  Cardiology  Discharge Exam: BP (!) 150/69 (BP Location: Right Arm)   Pulse 68   Temp 98.5 F (36.9 C) (Oral)   Resp 20   Ht 5\' 8"  (1.727 m)   Wt 80.8 kg   SpO2 100%   BMI 27.08 kg/m   General: NAD Cardiovascular: S1, S2 present Respiratory: CTA B    Discharge Instructions You were cared for by a hospitalist during your hospital stay. If you have any questions about your discharge medications or the care you received while you were in the hospital after you are discharged, you can call the unit and asked to speak with the hospitalist on call if the hospitalist that took care of you is not available. Once you are discharged, your primary care physician will handle any further medical issues. Please note that NO REFILLS for any  discharge medications will be authorized once you are discharged, as it is imperative that you return to your primary care physician (or establish a relationship with a primary care physician if you do not have one) for your aftercare needs so that they can reassess your need for medications and monitor your lab values.  Discharge Instructions    Diet - low sodium heart healthy   Complete by: As directed    Increase activity slowly   Complete by: As directed      Allergies as of 01/16/2020      Reactions   Aspirin Itching      Medication List    TAKE these medications   amLODipine 5 MG tablet Commonly known as: NORVASC Take 1 tablet (5 mg total) by mouth at bedtime.   atorvastatin 20 MG tablet Commonly known as: LIPITOR Take 1 tablet (20 mg total) by mouth daily.   brimonidine 0.2 % ophthalmic solution Commonly known as: ALPHAGAN Place 1 drop into the right eye 2 (two) times daily.   carvedilol 25 MG tablet Commonly known as: COREG Take 1 tablet (25 mg total) by mouth 2 (two) times daily.   cholecalciferol 25 MCG (1000 UNIT) tablet Commonly known as: VITAMIN D3 Take 1,000 Units by mouth daily.   cloNIDine  0.1 MG tablet Commonly known as: CATAPRES Take 0.1 mg by mouth 3 (three) times daily. What changed: Another medication with the same name was removed. Continue taking this medication, and follow the directions you see here.   furosemide 20 MG tablet Commonly known as: LASIX Take 2 tablets (40 mg total) by mouth daily. What changed:   how much to take  how to take this  when to take this  additional instructions   hydrALAZINE 100 MG tablet Commonly known as: APRESOLINE Take 1 tablet (100 mg total) by mouth 2 (two) times daily.   insulin aspart 100 UNIT/ML injection Commonly known as: novoLOG Inject 0-6 Units into the skin 3 (three) times daily with meals. Sliding scale   Insulin Pen Needle 31G X 5 MM Misc Commonly known as: B-D UF III MINI PEN  NEEDLES Four times daily   isosorbide mononitrate 60 MG 24 hr tablet Commonly known as: IMDUR Take 1 tablet (60 mg total) by mouth daily.   latanoprost 0.005 % ophthalmic solution Commonly known as: XALATAN Place 1 drop into the left eye at bedtime.   nitroGLYCERIN 0.4 MG SL tablet Commonly known as: NITROSTAT Place 1 tablet (0.4 mg total) under the tongue every 5 (five) minutes as needed for chest pain.   OneTouch Verio test strip Generic drug: glucose blood Use as instructed to test blood sugar 3 times daily E11.65   Toujeo Max SoloStar 300 UNIT/ML Sopn Generic drug: Insulin Glargine Inject 18 Units into the skin at bedtime.            Durable Medical Equipment  (From admission, onward)         Start     Ordered   01/14/20 1605  For home use only DME oxygen  Once       Question Answer Comment  Length of Need 6 Months   Mode or (Route) Nasal cannula   Liters per Minute 2   Frequency Continuous (stationary and portable oxygen unit needed)   Oxygen delivery system Gas      01/14/20 1604         Allergies  Allergen Reactions  . Aspirin Itching    Follow-up Information    [facility] Follow up.   Why: home 02 arranged- portable equipment to be delivered to room prior to discharge Contact information: [place] [address] [phone]        Robert Ebbs, MD. Schedule an appointment as soon as possible for a visit in 1 week(s).   Specialty: Internal Medicine Contact information: [address] [place] [address] [phone]        Dorothy Spark, MD .   Specialty: Cardiology Contact information: [address] [place] [address] [phone]                The results of significant diagnostics from this hospitalization (including imaging, microbiology, ancillary and laboratory) are listed below for reference.    Significant Diagnostic Studies: DG Chest 2 View  Result  Date: 01/15/2020 CLINICAL DATA:  69 year old male with history of difficulty breathing. EXAM: CHEST - 2 VIEW COMPARISON:  Chest x-ray 01/11/2020. FINDINGS: Lung volumes are normal. No consolidative airspace disease. No pleural effusions. Cephalization of the pulmonary vasculature, without frank pulmonary edema. Small nodular density measuring 1 cm projecting over the lower left lung, new compared to the prior study. No pneumothorax. Heart size is mildly enlarged. Upper mediastinal contours are within normal limits. Aortic atherosclerosis. IMPRESSION:  1. Cardiomegaly with pulmonary venous congestion, but no frank pulmonary edema. 2. Aortic atherosclerosis. 3. New nodular density projecting over the left lower lung. Repeat standing PA and lateral chest radiograph is recommended in 1-2 months to ensure the stability or resolution of this finding. Electronically Signed   By: Vinnie Langton M.D.   On: 01/15/2020 11:12   NM Pulmonary Perfusion  Result Date: 01/15/2020 CLINICAL DATA:  Dyspnea, chest pain, lower extremity edema, history hypertension, hypertrophic cardiomyopathy, a rhythmic, diabetes mellitus, chronic kidney disease EXAM: NUCLEAR MEDICINE PERFUSION LUNG SCAN TECHNIQUE: Perfusion images were obtained in multiple projections after intravenous injection of radiopharmaceutical. Ventilation scans intentionally deferred if perfusion scan and chest x-ray adequate for interpretation during COVID 19 epidemic. RADIOPHARMACEUTICALS:  3.95 mCi Tc-43m MAA IV COMPARISON:  None Correlation: Chest radiograph 01/15/2020 FINDINGS: Mild cardiomegaly. Otherwise normal perfusion lung scan. No worrisome perfusion defects. IMPRESSION: Mild cardiomegaly; otherwise normal perfusion lung scan. Electronically Signed   By: Lavonia Dana M.D.   On: 01/15/2020 11:55   DG Chest Portable 1 View  Result Date: 01/11/2020 CLINICAL DATA:  Shortness of breath. EXAM: PORTABLE CHEST 1 VIEW COMPARISON:  07/31/2017 FINDINGS: The cardiac  silhouette is moderately enlarged. Aortic atherosclerosis is noted. Symmetric hazy airspace and interstitial densities are present bilaterally. No sizable pleural effusion is identified although the right lateral costophrenic angle was incompletely imaged. No pneumothorax or acute osseous abnormality is seen. Most IMPRESSION: Cardiomegaly and bilateral lung opacities compatible with edema. Electronically Signed   By: Logan Bores M.D.   On: 01/11/2020 07:15   ECHOCARDIOGRAM COMPLETE  Result Date: 01/11/2020    ECHOCARDIOGRAM REPORT   Patient Name:   ABDULKADIR EMMANUEL Date of Exam: 01/11/2020 Medical Rec #:  [id number]     Height:       68.0 in Accession #:    [id number]    Weight:       185.2 lb Date of Birth:  11-Feb-1951      BSA:          1.978 m Patient Age:    82 years      BP:           177/84 mmHg Patient Gender: M             HR:           63 bpm. Exam Location:  Inpatient Procedure: 2D Echo Indications:    CHF 428.31  History:        Patient has prior history of Echocardiogram examinations, most                 recent 02/19/2019. CHF; Risk Factors:Diabetes, Dyslipidemia and                 Hypertension. NSVT. PAC's. PVC's. SVT.  Sonographer:    Jannett Celestine RDCS (AE) Referring Phys: [id number] [place]  1. Severe asymmetric LVH of the basal septum up to 1.8 cm. Prior history of HOCM. No chordal or mitral valve SAM. No significant LVOT gradient on this study (HR 63 bpm). No provocative maneuvers performed. Left ventricular ejection fraction, by estimation, is 60 to 65%. The left ventricle has normal function. The left ventricle has no regional wall motion abnormalities. There is severe asymmetric left ventricular hypertrophy of the basal-septal segment. Left ventricular diastolic parameters are  consistent with Grade II diastolic dysfunction (pseudonormalization). Elevated left atrial pressure.  2. Right ventricular systolic function is normal. The right ventricular size is normal. Tricuspid  regurgitation signal is  inadequate for assessing PA pressure.  3. The mitral valve is grossly normal. Mild mitral valve regurgitation. No evidence of mitral stenosis.  4. The aortic valve is tricuspid. Aortic valve regurgitation is not visualized. No aortic stenosis is present.  5. There is mild dilatation of the ascending aorta measuring 41 mm.  6. The inferior vena cava is normal in size with greater than 50% respiratory variability, suggesting right atrial pressure of 3 mmHg. Comparison(s): No significant change from prior study. No significant LVOT gradient on this study. FINDINGS  Left Ventricle: Severe asymmetric LVH of the basal septum up to 1.8 cm. Prior history of HOCM. No chordal or mitral valve SAM. No significant LVOT gradient on this study (HR 63 bpm). No provocative maneuvers performed. Left ventricular ejection fraction, by estimation, is 60 to 65%. The left ventricle has normal function. The left ventricle has no regional wall motion abnormalities. The left ventricular internal cavity size was normal in size. There is severe asymmetric left ventricular hypertrophy of the basal-septal segment. Left ventricular diastolic parameters are consistent with Grade II diastolic dysfunction (pseudonormalization). Elevated left atrial pressure. Right Ventricle: The right ventricular size is normal. No increase in right ventricular wall thickness. Right ventricular systolic function is normal. Tricuspid regurgitation signal is inadequate for assessing PA pressure. Left Atrium: Left atrial size was normal in size. Right Atrium: Right atrial size was normal in size. Pericardium: Trivial pericardial effusion is present. The pericardial effusion is circumferential. Mitral Valve: The mitral valve is grossly normal. Mild mitral valve regurgitation. No evidence of mitral valve stenosis. Tricuspid Valve: The tricuspid valve is grossly normal. Tricuspid valve regurgitation is not demonstrated. No evidence of tricuspid  stenosis. Aortic Valve: The aortic valve is tricuspid. Aortic valve regurgitation is not visualized. No aortic stenosis is present. Pulmonic Valve: The pulmonic valve was grossly normal. Pulmonic valve regurgitation is trivial. No evidence of pulmonic stenosis. Aorta: The aortic root is normal in size and structure. There is mild dilatation of the ascending aorta measuring 41 mm. Venous: The inferior vena cava is normal in size with greater than 50% respiratory variability, suggesting right atrial pressure of 3 mmHg. IAS/Shunts: The atrial septum is grossly normal.  LEFT VENTRICLE PLAX 2D LVIDd:         5.00 cm  Diastology LVIDs:         3.10 cm  LV e' lateral:   5.55 cm/s LV PW:         1.40 cm  LV E/e' lateral: 16.1 LV IVS:        1.40 cm  LV e' medial:    4.35 cm/s LVOT diam:     2.20 cm  LV E/e' medial:  20.6 LV SV:         116 LV SV Index:   59 LVOT Area:     3.80 cm  LEFT ATRIUM             Index       RIGHT ATRIUM           Index LA diam:        4.80 cm 2.43 cm/m  RA Area:     17.30 cm LA Vol (A2C):   77.1 ml 38.98 ml/m RA Volume:   42.30 ml  21.39 ml/m LA Vol (A4C):   44.5 ml 22.50 ml/m LA Biplane Vol: 60.1 ml 30.38 ml/m  AORTIC VALVE LVOT Vmax:   134.00 cm/s LVOT Vmean:  106.000 cm/s LVOT VTI:    0.305 m  AORTA Ao Root diam: 3.50 cm MITRAL VALVE MV Area (PHT): 2.16 cm    SHUNTS MV Decel Time: 352 msec    Systemic VTI:  0.30 m MV E velocity: 89.50 cm/s  Systemic Diam: 2.20 cm MV A velocity: 63.40 cm/s MV E/A ratio:  1.41 Eleonore Chiquito MD Electronically signed by Eleonore Chiquito MD Signature Date/Time: 01/11/2020/3:30:00 PM    Final    VAS [place] LOWER EXTREMITY VENOUS (DVT)  Result Date: 01/15/2020  Lower Venous DVTStudy Indications: Swelling, and SOB.  Comparison Study: No prior studies. Performing Technologist: Darlin Coco  Examination Guidelines: A complete evaluation includes B-mode imaging, spectral Doppler, color Doppler, and power Doppler as needed of all accessible portions of each vessel.  Bilateral testing is considered an integral part of a complete examination. Limited examinations for reoccurring indications may be performed as noted. The reflux portion of the exam is performed with the patient in reverse Trendelenburg.  +---------+---------------+---------+-----------+----------+--------------+ RIGHT    CompressibilityPhasicitySpontaneityPropertiesThrombus Aging +---------+---------------+---------+-----------+----------+--------------+ CFV      Full           Yes      Yes                                 +---------+---------------+---------+-----------+----------+--------------+ SFJ      Full                                                        +---------+---------------+---------+-----------+----------+--------------+ FV Prox  Full                                                        +---------+---------------+---------+-----------+----------+--------------+ FV Mid   Full                                                        +---------+---------------+---------+-----------+----------+--------------+ FV DistalFull                                                        +---------+---------------+---------+-----------+----------+--------------+ PFV      Full                                                        +---------+---------------+---------+-----------+----------+--------------+ POP      Full           Yes      Yes                                 +---------+---------------+---------+-----------+----------+--------------+ PTV      Full                                                        +---------+---------------+---------+-----------+----------+--------------+  PERO     Full                                                        +---------+---------------+---------+-----------+----------+--------------+   +---------+---------------+---------+-----------+----------+--------------+ LEFT      CompressibilityPhasicitySpontaneityPropertiesThrombus Aging +---------+---------------+---------+-----------+----------+--------------+ CFV      Full           Yes      Yes                                 +---------+---------------+---------+-----------+----------+--------------+ SFJ      Full                                                        +---------+---------------+---------+-----------+----------+--------------+ FV Prox  Full                                                        +---------+---------------+---------+-----------+----------+--------------+ FV Mid   Full                                                        +---------+---------------+---------+-----------+----------+--------------+ FV DistalFull                                                        +---------+---------------+---------+-----------+----------+--------------+ PFV      Full                                                        +---------+---------------+---------+-----------+----------+--------------+ POP      Full           Yes      Yes                                 +---------+---------------+---------+-----------+----------+--------------+ PTV      Full                                                        +---------+---------------+---------+-----------+----------+--------------+ PERO     Full                                                        +---------+---------------+---------+-----------+----------+--------------+       Summary: RIGHT: - There is no evidence of deep vein thrombosis in the lower extremity.  - No cystic structure found in the popliteal fossa. - Incidental: Femoral artery occlusion. Flow reconstitutes at popliteal artery.  LEFT: - There is no evidence of deep vein thrombosis in the lower extremity.  - No cystic structure found in the popliteal fossa. - Incidental: Femoral artery occlusion. Flow reconstitutes at popliteal artery.   *See table(s) above for measurements and observations. Electronically signed by Monica Martinez MD on 01/15/2020 at 11:55:43 AM.    Final     Microbiology: Recent Results (from the past 240 hour(s))  SARS Coronavirus 2 by RT PCR (hospital order, performed in [facility] hospital lab) Nasopharyngeal Nasopharyngeal Swab     Status: None   Collection Time: 01/11/20  5:41 AM   Specimen: Nasopharyngeal Swab  Result Value Ref Range Status   SARS Coronavirus 2 NEGATIVE NEGATIVE Final    Comment: (NOTE) SARS-CoV-2 target nucleic acids are NOT DETECTED.  The SARS-CoV-2 RNA is generally detectable in upper and lower respiratory specimens during the acute phase of infection. The lowest concentration of SARS-CoV-2 viral copies this assay can detect is 250 copies / mL. A negative result does not preclude SARS-CoV-2 infection and should not be used as the sole basis for treatment or other patient management decisions.  A negative result may occur with improper specimen collection / handling, submission of specimen other than nasopharyngeal swab, presence of viral mutation(s) within the areas targeted by this assay, and inadequate number of viral copies (<250 copies / mL). A negative result must be combined with clinical observations, patient history, and epidemiological information.  Fact Sheet for Patients:   StrictlyIdeas.no  Fact Sheet for Healthcare Providers: BankingDealers.co.za  This test is not yet approved or  cleared by the [place] FDA and has been authorized for detection and/or diagnosis of SARS-CoV-2 by FDA under an Emergency Use Authorization (EUA).  This EUA will remain in effect (meaning this test can be used) for the duration of the COVID-19 declaration under Section 564(b)(1) of the Act, 21 U.S.C. section 360bbb-3(b)(1), unless the authorization is terminated or revoked sooner.  Performed at [facility], [place] [address], [place], [place] [address]      Labs: Basic Metabolic Panel: Recent Labs  Lab 01/11/20 0545 01/11/20 0800 01/12/20 1954 01/13/20 0352 01/14/20 0430 01/15/20 0410 01/16/20 0429  NA   < >  --  136 139 136 135 137  K   < >  --  4.0 3.7 3.7 3.7 3.8  CL   < >  --  104 105 102 101 102  CO2   < >  --  26 25 26 26 27   GLUCOSE   < >  --  179* 108* 154* 126* 110*  BUN   < >  --  36* 34* 40* 47* 48*  CREATININE   < >  --  2.65* 2.43* 2.54* 3.29* 3.00*  CALCIUM   < >  --  8.6* 8.5* 8.7* 8.5* 8.6*  MG  --  1.9 1.7 1.8  --   --   --    < > = values in this interval not displayed.   Liver Function Tests: Recent Labs  Lab 01/11/20 0545  AST 56*  ALT 33  ALKPHOS 102  BILITOT 0.8  PROT 7.4  ALBUMIN 3.1*   No results for input(s): LIPASE, AMYLASE in the last 168 hours. No results for input(s): AMMONIA in the last  168 hours. CBC: Recent Labs  Lab 01/11/20 0545 01/12/20 1954 01/13/20 0352 01/14/20 0430  WBC 7.9 4.9 4.7 4.3  NEUTROABS 5.7 3.4 3.0 2.7  HGB 11.4* 10.2* 10.7* 11.1*  HCT 37.7* 32.1* 34.0* 35.0*  MCV 104.4* 100.3* 100.0 99.4  PLT 216 190 189 192   Cardiac Enzymes: No results for input(s): CKTOTAL, CKMB, CKMBINDEX, TROPONINI in the last 168 hours. BNP: BNP (last 3 results) Recent Labs    01/11/20 0545  BNP 628.4*    ProBNP (last 3 results) No results for input(s): PROBNP in the last 8760 hours.  CBG: Recent Labs  Lab 01/15/20 1223 01/15/20 1635 01/15/20 2201 01/16/20 0611 01/16/20 1223  GLUCAP 165* 123* 168* 98 112*       Signed:  Alma Friendly, MD [facility] 01/16/2020, 3:31 PM

## 2020-01-16 NOTE — Progress Notes (Signed)
Pt discharged from unit. Medication/discharge instruction given, VVS. O2 equipment D/C with pt   Phoebe Sharps, RN

## 2020-01-16 NOTE — TOC Transition Note (Signed)
Transition of Care [facility]) - CM/SW Discharge Note   Patient Details  Name: Robert Chaney MRN: [id number] Date of Birth: [birth year]  Transition of [facility]) CM/SW Contact:  Claudie Leach, RN 01/16/2020, 5:05 PM   Clinical Narrative:    Patient to d/c with home O2.  Patient has O2 set up in room from [facility].  Patient relayed to me that he wanted to change companies because he didn't like the way [facility] handled his CPAP and he didn't understand how to use portable tank or concentrator, and he wanted new equipment from a different company.  We discussed changing his order and possibly his existing CPAP to [facility].  Patient agreed for [facility] DME to be picked up and he would wait for [place] delivery.  I placed order with [employer]'s on call center.  Delivery time & order processing could not be determined.  RN then informed me patient was discharged with [employer] O2 equipment.  RN reportedly explained DME to patient. Order left with [facility] in hopes that patients DME provider can still be changed and the on-call person informed me there was no way to f/u on order.     Final next level of care: Home/Self Care      Discharge Plan and Services   Discharge Planning Services: CM Consult Post Acute Care Choice: Durable Medical Equipment          DME Arranged: Oxygen DME Agency: [facility] Date DME Agency Contacted: 01/14/20 Time DME Agency Contacted: 1620 Representative spoke with at DME Agency: [facility] ([place]) Interventions     Readmission Risk Interventions No flowsheet data found.

## 2020-01-16 NOTE — Consult Note (Signed)
Cardiology Consultation:   Patient ID: Robert Chaney MRN: [id number]; DOB: [birth year]  Admit date: 01/11/2020 Date of Consult: 01/16/2020  Primary Care Provider: [name], MD [facility] Cardiologist: Ena Dawley, MD   [place] Electrophysiologist:  None     Patient Profile:   Robert Chaney is a 69 y.o. male with a hx of hypertrophic cardiomyopathy who is being seen today for the evaluation of congestive heart failure at the request of an EP.  History of Present Illness:   [name] admitted because of volume overload.  He has a history of hypertrophic obstructive cardiomyopathy, hypertension diabetes CPAP treated sleep apnea chronic anemia.  He obtained a pulse oximeter about a month ago.  Mostly his levels have been in the low 80s.  On the day of admission it was in the low to mid 60s.  On arrival in the emergency room his blood pressure was 215/110.  Echocardiographic evaluation 2017 was suspicious for HCM.  He underwent cMRI confirming HCM with a basal septum of 21 mm patchy LGE over repeated assessments has had variability of the presence of of an outflow gradient  He has no children.   Past Medical History:  Diagnosis Date  . Altered mental status    a. 05/2017 - adm with blurred vision, somnolence in setting of AKI and high blood sugar.  . Anemia   . CKD (chronic kidney disease), stage III   . Diabetes mellitus   . Diastolic CHF, chronic (HCC)    A.  03/2009 Echo: EF 60-65%, Gr II diast dysfxn  . Elevated troponin    a. 2013 - troponin 1.4. b. 2019 - troponin 0.32; neg nuc 07/2017.  . Glaucoma   . Hyperlipidemia    HYPERCHOLESTEROLEMIA  . Hypertension    MARKED LEFT VENTRICULAR HYPERTROPHY BY PREVIOUS ECHOCARDIOGRAM--HE HAS HYPERDYNAMIC LEFT VENTRICULAR SYSTOLIC FUNCTION AND HAS IMPAIRED RELAXATION BY ECHO  . Hypertrophic cardiomyopathy ([place])   . NSVT (nonsustained ventricular tachycardia) ([place])   . Premature atrial contractions   . PVC's (premature  ventricular contractions)   . SVT (supraventricular tachycardia) (HCC)     Past Surgical History:  Procedure Laterality Date  . NO PAST SURGERIES         Inpatient Medications: Scheduled Meds: . atorvastatin  20 mg Oral Daily  . brimonidine  1 drop Right Eye BID  . carvedilol  25 mg Oral BID  . cholecalciferol  1,000 Units Oral Daily  . cloNIDine  0.1 mg Oral TID  . enoxaparin (LOVENOX) injection  30 mg Subcutaneous Q24H  . hydrALAZINE  100 mg Oral BID  . insulin aspart  0-5 Units Subcutaneous QHS  . insulin aspart  0-9 Units Subcutaneous TID WC  . insulin glargine  15 Units Subcutaneous QHS  . isosorbide mononitrate  60 mg Oral Daily  . latanoprost  1 drop Left Eye QHS   Continuous Infusions: . nitroGLYCERIN Stopped (01/11/20 1833)   PRN Meds:   Allergies:    Allergies  Allergen Reactions  . Aspirin Itching    Social History:   Social History   Socioeconomic History  . Marital status: Legally Separated    Spouse name: Not on file  . Number of children: Not on file  . Years of education: Not on file  . Highest education level: Not on file  Occupational History  . Occupation: Forensic psychologist: [employer]    Comment: At [employer]  . Smoking status: Never Smoker  .  Smokeless tobacco: Never Used  Vaping Use  . Vaping Use: Never used  Substance and Sexual Activity  . Alcohol use: No  . Drug use: No  . Sexual activity: Never  Other Topics Concern  . Not on file  Social History Narrative   Originally from [place].    Social Determinants of Health   Financial Resource Strain:   . Difficulty of Paying Living Expenses:   Food Insecurity:   . Worried About Charity fundraiser in the Last Year:   . Arboriculturist in the Last Year:   Transportation Needs:   . Film/video editor (Medical):   Marland Kitchen Lack of Transportation (Non-Medical):   Physical Activity:   . Days of Exercise per Week:   . Minutes of Exercise per Session:     Stress:   . Feeling of Stress :   Social Connections:   . Frequency of Communication with Friends and Family:   . Frequency of Social Gatherings with Friends and Family:   . Attends Religious Services:   . Active Member of Clubs or Organizations:   . Attends Archivist Meetings:   Marland Kitchen Marital Status:   Intimate Partner Violence:   . Fear of Current or Ex-Partner:   . Emotionally Abused:   Marland Kitchen Physically Abused:   . Sexually Abused:     Family History:     Family History  Problem Relation Age of Onset  . Hypertension Father   . Stroke Father   . Hypertension Mother   . Diabetes Brother   . Hypertension Brother   . Diabetes Brother      ROS:  Please see the history of present illness.    All other ROS reviewed and negative.     Physical Exam/Data:   Vitals:   01/16/20 0352 01/16/20 0832 01/16/20 1014 01/16/20 1221  BP: (!) 175/86 (!) 171/80 (!) 149/64 (!) 150/69  Pulse: 60 66  68  Resp: 17 18  20   Temp: 97.8 F (36.6 C) 98.5 F (36.9 C)  98.5 F (36.9 C)  TempSrc: Oral Oral  Oral  SpO2: 95% 95%  100%  Weight: 80.8 kg     Height:        Intake/Output Summary (Last 24 hours) at 01/16/2020 1448 Last data filed at 01/16/2020 1308 Gross per 24 hour  Intake 120 ml  Output 850 ml  Net -730 ml   Last 3 Weights 01/16/2020 01/15/2020 01/14/2020  Weight (lbs) 178 lb 1.6 oz 178 lb 5.6 oz 176 lb 9.4 oz  Weight (kg) 80.786 kg 80.9 kg 80.1 kg     Body mass index is 27.08 kg/m.  General:  Well nourished, well developed, in no acute distress wearing oxygen HEENT: normal Lymph: no adenopathy Neck: no JVD Endocrine:  No thryomegaly Vascular: No carotid bruits; FA pulses 2+ bilaterally without bruits  Cardiac:  normal S1, S2; RRR; 2/6 systolic murmur which increases with Valsalva Lungs:  clear to auscultation bilaterally, no wheezing, rhonchi or rales  Abd: soft, nontender, no hepatomegaly  Ext: 2+ bilateral edema Musculoskeletal:  No deformities, BUE and BLE  strength normal and equal Skin: warm and dry  Neuro:  CNs 2-12 intact, no focal abnormalities noted Psych:  Normal affect   EKG:  The EKG was personally reviewed and demonstrates: 01/11/2020 demonstrated sinus rhythm with voltage for LVH Telemetry:  Telemetry was personally reviewed and demonstrates: Sinus rhythm  Relevant CV Studies:    Laboratory Data:  High Sensitivity Troponin:  Recent Labs  Lab 01/11/20 0545 01/11/20 0800  TROPONINIHS 63* 61*     Chemistry Recent Labs  Lab 01/14/20 0430 01/15/20 0410 01/16/20 0429  NA 136 135 137  K 3.7 3.7 3.8  CL 102 101 102  CO2 26 26 27   GLUCOSE 154* 126* 110*  BUN 40* 47* 48*  CREATININE 2.54* 3.29* 3.00*  CALCIUM 8.7* 8.5* 8.6*  GFRNONAA 25* 18* 20*  GFRAA 29* 21* 23*  ANIONGAP 8 8 8     Recent Labs  Lab 01/11/20 0545  PROT 7.4  ALBUMIN 3.1*  AST 56*  ALT 33  ALKPHOS 102  BILITOT 0.8   Hematology Recent Labs  Lab 01/12/20 1954 01/13/20 0352 01/14/20 0430  WBC 4.9 4.7 4.3  RBC 3.20* 3.40* 3.52*  HGB 10.2* 10.7* 11.1*  HCT 32.1* 34.0* 35.0*  MCV 100.3* 100.0 99.4  MCH 31.9 31.5 31.5  MCHC 31.8 31.5 31.7  RDW 13.1 13.2 13.2  PLT 190 189 192   BNP Recent Labs  Lab 01/11/20 0545  BNP 628.4*    DDimer  Recent Labs  Lab 01/14/20 1552  DDIMER 0.73*     Radiology/Studies:  DG Chest 2 View  Result Date: 01/15/2020 CLINICAL DATA:  69 year old male with history of difficulty breathing. EXAM: CHEST - 2 VIEW COMPARISON:  Chest x-ray 01/11/2020. FINDINGS: Lung volumes are normal. No consolidative airspace disease. No pleural effusions. Cephalization of the pulmonary vasculature, without frank pulmonary edema. Small nodular density measuring 1 cm projecting over the lower left lung, new compared to the prior study. No pneumothorax. Heart size is mildly enlarged. Upper mediastinal contours are within normal limits. Aortic atherosclerosis. IMPRESSION: 1. Cardiomegaly with pulmonary venous congestion, but no  frank pulmonary edema. 2. Aortic atherosclerosis. 3. New nodular density projecting over the left lower lung. Repeat standing PA and lateral chest radiograph is recommended in 1-2 months to ensure the stability or resolution of this finding. Electronically Signed   By: Vinnie Langton M.D.   On: 01/15/2020 11:12   NM Pulmonary Perfusion  Result Date: 01/15/2020 CLINICAL DATA:  Dyspnea, chest pain, lower extremity edema, history hypertension, hypertrophic cardiomyopathy, a rhythmic, diabetes mellitus, chronic kidney disease EXAM: NUCLEAR MEDICINE PERFUSION LUNG SCAN TECHNIQUE: Perfusion images were obtained in multiple projections after intravenous injection of radiopharmaceutical. Ventilation scans intentionally deferred if perfusion scan and chest x-ray adequate for interpretation during COVID 19 epidemic. RADIOPHARMACEUTICALS:  3.95 mCi Tc-54m MAA IV COMPARISON:  None Correlation: Chest radiograph 01/15/2020 FINDINGS: Mild cardiomegaly. Otherwise normal perfusion lung scan. No worrisome perfusion defects. IMPRESSION: Mild cardiomegaly; otherwise normal perfusion lung scan. Electronically Signed   By: Lavonia Dana M.D.   On: 01/15/2020 11:55   VAS [place] LOWER EXTREMITY VENOUS (DVT)  Result Date: 01/15/2020  Lower Venous DVTStudy Indications: Swelling, and SOB.  Comparison Study: No prior studies. Performing Technologist: Darlin Coco  Examination Guidelines: A complete evaluation includes B-mode imaging, spectral Doppler, color Doppler, and power Doppler as needed of all accessible portions of each vessel. Bilateral testing is considered an integral part of a complete examination. Limited examinations for reoccurring indications may be performed as noted. The reflux portion of the exam is performed with the patient in reverse Trendelenburg.  +---------+---------------+---------+-----------+----------+--------------+ RIGHT    CompressibilityPhasicitySpontaneityPropertiesThrombus Aging  +---------+---------------+---------+-----------+----------+--------------+ CFV      Full           Yes      Yes                                 +---------+---------------+---------+-----------+----------+--------------+  SFJ      Full                                                        +---------+---------------+---------+-----------+----------+--------------+ FV Prox  Full                                                        +---------+---------------+---------+-----------+----------+--------------+ FV Mid   Full                                                        +---------+---------------+---------+-----------+----------+--------------+ FV DistalFull                                                        +---------+---------------+---------+-----------+----------+--------------+ PFV      Full                                                        +---------+---------------+---------+-----------+----------+--------------+ POP      Full           Yes      Yes                                 +---------+---------------+---------+-----------+----------+--------------+ PTV      Full                                                        +---------+---------------+---------+-----------+----------+--------------+ PERO     Full                                                        +---------+---------------+---------+-----------+----------+--------------+   +---------+---------------+---------+-----------+----------+--------------+ LEFT     CompressibilityPhasicitySpontaneityPropertiesThrombus Aging +---------+---------------+---------+-----------+----------+--------------+ CFV      Full           Yes      Yes                                 +---------+---------------+---------+-----------+----------+--------------+ SFJ      Full                                                         +---------+---------------+---------+-----------+----------+--------------+  FV Prox  Full                                                        +---------+---------------+---------+-----------+----------+--------------+ FV Mid   Full                                                        +---------+---------------+---------+-----------+----------+--------------+ FV DistalFull                                                        +---------+---------------+---------+-----------+----------+--------------+ PFV      Full                                                        +---------+---------------+---------+-----------+----------+--------------+ POP      Full           Yes      Yes                                 +---------+---------------+---------+-----------+----------+--------------+ PTV      Full                                                        +---------+---------------+---------+-----------+----------+--------------+ PERO     Full                                                        +---------+---------------+---------+-----------+----------+--------------+     Summary: RIGHT: - There is no evidence of deep vein thrombosis in the lower extremity.  - No cystic structure found in the popliteal fossa. - Incidental: Femoral artery occlusion. Flow reconstitutes at popliteal artery.  LEFT: - There is no evidence of deep vein thrombosis in the lower extremity.  - No cystic structure found in the popliteal fossa. - Incidental: Femoral artery occlusion. Flow reconstitutes at popliteal artery.  *See table(s) above for measurements and observations. Electronically signed by Monica Martinez MD on 01/15/2020 at 11:55:43 AM.    Final            Assessment and Plan:   1. Hypertrophic obstructive cardiomyopathy 2. HFpEF acute/chronic 3. Renal insufficiency acute/chronic 4. Hypertension 5. Oxygen dependent lung disease 6. Obstructive sleep apnea  The  patient has cardiorenal syndrome with heart failure and renal insufficiency probably both aggravated by his hypertensive nephropathy and heart disease.  His MRI however clearly supports a concomitant diagnosis of hypertrophic obstructive cardiomyopathy.  Recommend continued aggressive treatment  of his blood pressure and certainly there is room to increase his clonidine.  Continue low-dose diuretics with close follow-up of his renal function.  His oxygen saturations have been low for at least a month--suspect he needs home O2 24/7.Marland Kitchen    [facility] will sign off.   Medication Recommendations:  OUTPT Diuretics furosemide 40 qod Other recommendations (labs, testing, etc):  BMET in one week  Follow up as an outpatient:   With his primary care physician Cardiac follow-up as previously scheduled  For questions or updates, please contact [place] Please consult [email or web address] for contact info under    Signed, Virl Axe, MD  01/16/2020 2:48 PM

## 2020-01-20 DIAGNOSIS — I5032 Chronic diastolic (congestive) heart failure: Secondary | ICD-10-CM | POA: Diagnosis not present

## 2020-01-20 DIAGNOSIS — G4733 Obstructive sleep apnea (adult) (pediatric): Secondary | ICD-10-CM | POA: Diagnosis not present

## 2020-02-03 DIAGNOSIS — E1122 Type 2 diabetes mellitus with diabetic chronic kidney disease: Secondary | ICD-10-CM | POA: Diagnosis not present

## 2020-02-03 DIAGNOSIS — I10 Essential (primary) hypertension: Secondary | ICD-10-CM | POA: Diagnosis not present

## 2020-02-03 DIAGNOSIS — E7849 Other hyperlipidemia: Secondary | ICD-10-CM | POA: Diagnosis not present

## 2020-02-03 DIAGNOSIS — I422 Other hypertrophic cardiomyopathy: Secondary | ICD-10-CM | POA: Diagnosis not present

## 2020-02-03 DIAGNOSIS — Z0000 Encounter for general adult medical examination without abnormal findings: Secondary | ICD-10-CM | POA: Diagnosis not present

## 2020-02-03 DIAGNOSIS — I509 Heart failure, unspecified: Secondary | ICD-10-CM | POA: Diagnosis not present

## 2020-02-07 DIAGNOSIS — E1122 Type 2 diabetes mellitus with diabetic chronic kidney disease: Secondary | ICD-10-CM | POA: Diagnosis not present

## 2020-02-07 DIAGNOSIS — I509 Heart failure, unspecified: Secondary | ICD-10-CM | POA: Diagnosis not present

## 2020-02-07 DIAGNOSIS — M19012 Primary osteoarthritis, left shoulder: Secondary | ICD-10-CM | POA: Diagnosis not present

## 2020-02-07 DIAGNOSIS — I10 Essential (primary) hypertension: Secondary | ICD-10-CM | POA: Diagnosis not present

## 2020-02-11 ENCOUNTER — Encounter: Payer: PPO | Attending: Internal Medicine | Admitting: Dietician

## 2020-02-11 ENCOUNTER — Ambulatory Visit (INDEPENDENT_AMBULATORY_CARE_PROVIDER_SITE_OTHER): Payer: PPO | Admitting: Pharmacist Clinician (PhC)/ Clinical Pharmacy Specialist

## 2020-02-11 ENCOUNTER — Other Ambulatory Visit: Payer: Self-pay

## 2020-02-11 VITALS — BP 150/68 | HR 62 | Ht 68.0 in | Wt 185.4 lb

## 2020-02-11 DIAGNOSIS — I10 Essential (primary) hypertension: Secondary | ICD-10-CM

## 2020-02-11 DIAGNOSIS — I5033 Acute on chronic diastolic (congestive) heart failure: Secondary | ICD-10-CM

## 2020-02-11 LAB — BASIC METABOLIC PANEL
BUN/Creatinine Ratio: 15 (ref 10–24)
BUN: 40 mg/dL — ABNORMAL HIGH (ref 8–27)
CO2: 26 mmol/L (ref 20–29)
Calcium: 9 mg/dL (ref 8.6–10.2)
Chloride: 104 mmol/L (ref 96–106)
Creatinine, Ser: 2.62 mg/dL — ABNORMAL HIGH (ref 0.76–1.27)
GFR calc Af Amer: 27 mL/min/{1.73_m2} — ABNORMAL LOW (ref >59)
GFR calc non Af Amer: 24 mL/min/{1.73_m2} — ABNORMAL LOW (ref >59)
Glucose: 72 mg/dL (ref 65–99)
Potassium: 4.4 mmol/L (ref 3.5–5.2)
Sodium: 139 mmol/L (ref 134–144)

## 2020-02-11 NOTE — Assessment & Plan Note (Signed)
Patient with essential hypertension, recently hospitalized for hypertensive emergency.  Pressures at home since discharge are improved, mostly 658-006'J systolic per patient.  Chart notes that previously when he was on hydralazine 100 mg tid he developed dizziness.  Will have him increase dose to 100/50/500 for about 8-10 days to see if he tolerates.  If so, he can increase dose to 100 mg three times daily, should pressure still be > 494 systolic.  If dizziness returns at any time he should cut back to 100 mg twice daily and call the office.  We can consider increasing clonidine at that time if necessary.  He has now been seen by [facility] for 3 visits and will arrange for his next visit to be with Dr. Oval Linsey.

## 2020-02-11 NOTE — Assessment & Plan Note (Signed)
Patient still having edema of right leg.  Will have him increase furosemide to 60 mg x 3 days then resume 40 mg daily.  Repeat BMET in the office today.

## 2020-02-11 NOTE — Progress Notes (Signed)
Patient ID: Robert Chaney                 DOB: 1950-12-23                      MRN: [id number]     HPI: Robert Chaney is a 69 y.o. male referred by Dr. Oval Linsey to Advanced Hypertension clinic. See pertinent medical history below. Patein was referred to PREP program and vivify on 10/06/2019, but needs to call back to arrange 1st exercise section and doesn't have an email acount ; therefore, is not able to use [employer].   He was originally seen by Dr. Oval Linsey in April and found to have a BP of 148/78.  He stated hypertension problems since moving to the [place] about 20 years ago.  She had him restart amlodipine 10 mg qd and carvedilol 25 mg bid.   At his follow up with Raquel Rodriguez-Guzman patient noted increase in LEE and fatigue, although BP was good at 132/72.  Amlodipine was decreased from 10 to 5 mg daily and clonidine to 0.1 mg am/0.2 mg hs.  His next visit, in July showed his pressure had jumped to 210/102, despite him reporting feeling well.  Clonidine was increased to three times daily (0.1/0.1/0.2) and he was scheduled to start the evening PREP exercise classes later in the month.  Since that visit he was admitted to [facility] for hypertensive emergency, acute CHF exacerbation and acute respiratory failure.  Pressure was 215/110 by EMT at admit and dropped to 149/64 by discharge.  See medication adjustments below.    Today he returns for follow up.  He is feeling well, although notes the edema is mostly unchanged in his right leg.  No problems with his current medication regimen and states compliance with meds.  Now also more compliant with home CPAP usage.  Notes his O2 levels are back to normal ranges.  Past Medical History: hyperlipidemia Last drawn 2018 TC 157, TG 81, HDL 59, LDL 82 - on atorvastatin 20  CHF 7/21 echo EF 60-65%, LVH at basal-septal segment, grade II diastolic dysfunction  DM2 5/21: A1c 6.0 - on Novolog and Toujeo  CKD AKI on admit SCr up to 3.0 on hospital d/c GFR 23  OSA  Has CPAP, not always compliant   Current HTN meds:  Amlodipine 5 mg daily (before 10am) Carvedilol 25 mg twice daily (9am & 10pm) Furosemide 40mg  daily  - hospital increase from 20 mg  Clonidine 0.1mg  tid - hospital change Hydralazine 100 mg bid - hospital start  Previously tried:  Hydralazine - dizzy - was at 100 mg dose Imdur Losartan - worsening renal function  BP goal: <130/80  Family History: The patient's family history includes Diabetes in his brother and brother; Hypertension in his father  Social History: coffee drinker, denies tobacco or alcohol use  Diet: He prepares his meals at home and does not add any salt.    Exercise: activities of daily living - will start PREP classes in the evenings this month -not able to start [place] BP readings: BP 133 lowest a few days ago otherwiswe 150-160's/ 60-70's  30 readings in the past month - mix of morning and evening  Average 829 systolic.  Diastolic readings and HR all WNL  Wt Readings from Last 3 Encounters:  02/11/20 185 lb 6.4 oz (84.1 kg)  01/16/20 178 lb 1.6 oz (80.8 kg)  01/03/20 185 lb 3.2 oz (84 kg)  BP Readings from Last 3 Encounters:  02/11/20 (!) 150/68  01/16/20 (!) 150/69  01/03/20 (!) 210/102   Pulse Readings from Last 3 Encounters:  02/11/20 62  01/16/20 68  01/03/20 66     Past Medical History:  Diagnosis Date  . Altered mental status    a. 05/2017 - adm with blurred vision, somnolence in setting of AKI and high blood sugar.  . Anemia   . CKD (chronic kidney disease), stage III   . Diabetes mellitus   . Diastolic CHF, chronic (HCC)    A.  03/2009 Echo: EF 60-65%, Gr II diast dysfxn  . Elevated troponin    a. 2013 - troponin 1.4. b. 2019 - troponin 0.32; neg nuc 07/2017.  . Glaucoma   . Hyperlipidemia    HYPERCHOLESTEROLEMIA  . Hypertension    MARKED LEFT VENTRICULAR HYPERTROPHY BY PREVIOUS ECHOCARDIOGRAM--HE HAS HYPERDYNAMIC LEFT VENTRICULAR SYSTOLIC FUNCTION AND HAS IMPAIRED  RELAXATION BY ECHO  . Hypertrophic cardiomyopathy ([place])   . NSVT (nonsustained ventricular tachycardia) ([place])   . Premature atrial contractions   . PVC's (premature ventricular contractions)   . SVT (supraventricular tachycardia) ([place])     Current Outpatient Medications on File Prior to Visit  Medication Sig Dispense Refill  . amLODipine (NORVASC) 5 MG tablet Take 1 tablet (5 mg total) by mouth at bedtime. 30 tablet 1  . atorvastatin (LIPITOR) 20 MG tablet Take 1 tablet (20 mg total) by mouth daily. 90 tablet 2  . brimonidine (ALPHAGAN) 0.2 % ophthalmic solution Place 1 drop into the right eye 2 (two) times daily.    . carvedilol (COREG) 25 MG tablet Take 1 tablet (25 mg total) by mouth 2 (two) times daily. 180 tablet 2  . cholecalciferol (VITAMIN D3) 25 MCG (1000 UT) tablet Take 1,000 Units by mouth daily.    . cloNIDine (CATAPRES) 0.1 MG tablet Take 0.1 mg by mouth 3 (three) times daily.    . furosemide (LASIX) 20 MG tablet Take 2 tablets (40 mg total) by mouth daily. 90 tablet 0  . glucose blood (ONETOUCH VERIO) test strip Use as instructed to test blood sugar 3 times daily E11.65 100 each 12  . hydrALAZINE (APRESOLINE) 100 MG tablet Take 1 tablet (100 mg total) by mouth 2 (two) times daily. 90 tablet 3  . insulin aspart (NOVOLOG) 100 UNIT/ML injection Inject 0-6 Units into the skin 3 (three) times daily with meals. Sliding scale    . Insulin Glargine (TOUJEO MAX SOLOSTAR) 300 UNIT/ML SOPN Inject 18 Units into the skin at bedtime.    . Insulin Pen Needle (B-D UF III MINI PEN NEEDLES) 31G X 5 MM MISC Four times daily 150 each 6  . isosorbide mononitrate (IMDUR) 60 MG 24 hr tablet Take 1 tablet (60 mg total) by mouth daily. 90 tablet 2  . latanoprost (XALATAN) 0.005 % ophthalmic solution Place 1 drop into the left eye at bedtime.    . nitroGLYCERIN (NITROSTAT) 0.4 MG SL tablet Place 1 tablet (0.4 mg total) under the tongue every 5 (five) minutes as needed for chest pain. 3 tablet 2   No  current facility-administered medications on file prior to visit.    Allergies  Allergen Reactions  . Aspirin Itching    Blood pressure (!) 150/68, pulse 62, height 5\' 8"  (1.727 m), weight 185 lb 6.4 oz (84.1 kg).  Essential hypertension Patient with essential hypertension, recently hospitalized for hypertensive emergency.  Pressures at home since discharge are improved, mostly 998-338'S systolic per patient.  Chart notes that previously when he was on hydralazine 100 mg tid he developed dizziness.  Will have him increase dose to 100/50/500 for about 8-10 days to see if he tolerates.  If so, he can increase dose to 100 mg three times daily, should pressure still be > 116 systolic.  If dizziness returns at any time he should cut back to 100 mg twice daily and call the office.  We can consider increasing clonidine at that time if necessary.  He has now been seen by [facility] in the Advanced Hypertension Clinic for 3 visits and will arrange for his next visit to be with Dr. Oval Linsey.    Acute exacerbation of CHF (congestive heart failure) ([place]) Patient still having edema of right leg.  Will have him increase furosemide to 60 mg x 3 days then resume 40 mg daily.  Repeat BMET in the office today.   Tommy Medal PharmD CPP [facility] [address] [place] [address] 02/11/2020 10:27 AM

## 2020-02-11 NOTE — Patient Instructions (Signed)
Dr. Blenda Mounts nurse will call you for a follow up appointment  Go to the lab today to check kidney function  Check your blood pressure at home daily and keep record of the readings.  Take your BP meds as follows:  Cut 4-5 hydralazine tablets in half and take 1/2 tablet daily at mid-day.  After those 8-10 days, if you are feeling well and your BP is still > 847 systolic (top number), please increase to a whole tablet three times daily.  Take 3 Lasix (furosemide) tablets daily for 3 days then resume with 2 tablets daily.  Continue with all other medications.  Bring all of your meds, your BP cuff and your record of home blood pressures to your next appointment.  Exercise as you're able, try to walk approximately 30 minutes per day.  Keep salt intake to a minimum, especially watch canned and prepared boxed foods.  Eat more fresh fruits and vegetables and fewer canned items.  Avoid eating in fast food restaurants.    HOW TO TAKE YOUR BLOOD PRESSURE: . Rest 5 minutes before taking your blood pressure. .  Don't smoke or drink caffeinated beverages for at least 30 minutes before. . Take your blood pressure before (not after) you eat. . Sit comfortably with your back supported and both feet on the floor (don't cross your legs). . Elevate your arm to heart level on a table or a desk. . Use the proper sized cuff. It should fit smoothly and snugly around your bare upper arm. There should be enough room to slip a fingertip under the cuff. The bottom edge of the cuff should be 1 inch above the crease of the elbow. . Ideally, take 3 measurements at one sitting and record the average.

## 2020-02-14 DIAGNOSIS — G4733 Obstructive sleep apnea (adult) (pediatric): Secondary | ICD-10-CM | POA: Diagnosis not present

## 2020-02-14 DIAGNOSIS — I5032 Chronic diastolic (congestive) heart failure: Secondary | ICD-10-CM | POA: Diagnosis not present

## 2020-02-18 ENCOUNTER — Inpatient Hospital Stay (HOSPITAL_COMMUNITY)
Admission: EM | Admit: 2020-02-18 | Discharge: 2020-02-21 | DRG: 304 | Disposition: A | Discharge status: Home / Self Care | Payer: PPO | Attending: Internal Medicine | Admitting: Internal Medicine

## 2020-02-18 ENCOUNTER — Encounter (HOSPITAL_COMMUNITY): Payer: Self-pay | Admitting: Internal Medicine

## 2020-02-18 ENCOUNTER — Other Ambulatory Visit: Payer: Self-pay

## 2020-02-18 ENCOUNTER — Emergency Department (HOSPITAL_COMMUNITY): Payer: PPO

## 2020-02-18 DIAGNOSIS — I160 Hypertensive urgency: Principal | ICD-10-CM | POA: Diagnosis present

## 2020-02-18 DIAGNOSIS — Z794 Long term (current) use of insulin: Secondary | ICD-10-CM | POA: Diagnosis not present

## 2020-02-18 DIAGNOSIS — E1122 Type 2 diabetes mellitus with diabetic chronic kidney disease: Secondary | ICD-10-CM | POA: Diagnosis present

## 2020-02-18 DIAGNOSIS — I493 Ventricular premature depolarization: Secondary | ICD-10-CM | POA: Diagnosis present

## 2020-02-18 DIAGNOSIS — I471 Supraventricular tachycardia: Secondary | ICD-10-CM | POA: Diagnosis not present

## 2020-02-18 DIAGNOSIS — J9621 Acute and chronic respiratory failure with hypoxia: Secondary | ICD-10-CM | POA: Diagnosis present

## 2020-02-18 DIAGNOSIS — H409 Unspecified glaucoma: Secondary | ICD-10-CM | POA: Diagnosis present

## 2020-02-18 DIAGNOSIS — N184 Chronic kidney disease, stage 4 (severe): Secondary | ICD-10-CM | POA: Diagnosis present

## 2020-02-18 DIAGNOSIS — I248 Other forms of acute ischemic heart disease: Secondary | ICD-10-CM | POA: Diagnosis present

## 2020-02-18 DIAGNOSIS — Z886 Allergy status to analgesic agent status: Secondary | ICD-10-CM | POA: Diagnosis not present

## 2020-02-18 DIAGNOSIS — R0602 Shortness of breath: Secondary | ICD-10-CM | POA: Diagnosis not present

## 2020-02-18 DIAGNOSIS — R0902 Hypoxemia: Secondary | ICD-10-CM

## 2020-02-18 DIAGNOSIS — R7989 Other specified abnormal findings of blood chemistry: Secondary | ICD-10-CM | POA: Diagnosis present

## 2020-02-18 DIAGNOSIS — I509 Heart failure, unspecified: Secondary | ICD-10-CM

## 2020-02-18 DIAGNOSIS — R918 Other nonspecific abnormal finding of lung field: Secondary | ICD-10-CM | POA: Diagnosis not present

## 2020-02-18 DIAGNOSIS — I422 Other hypertrophic cardiomyopathy: Secondary | ICD-10-CM | POA: Diagnosis not present

## 2020-02-18 DIAGNOSIS — I169 Hypertensive crisis, unspecified: Secondary | ICD-10-CM | POA: Diagnosis present

## 2020-02-18 DIAGNOSIS — E118 Type 2 diabetes mellitus with unspecified complications: Secondary | ICD-10-CM | POA: Diagnosis present

## 2020-02-18 DIAGNOSIS — I491 Atrial premature depolarization: Secondary | ICD-10-CM | POA: Diagnosis not present

## 2020-02-18 DIAGNOSIS — G4733 Obstructive sleep apnea (adult) (pediatric): Secondary | ICD-10-CM | POA: Diagnosis present

## 2020-02-18 DIAGNOSIS — I150 Renovascular hypertension: Secondary | ICD-10-CM

## 2020-02-18 DIAGNOSIS — J189 Pneumonia, unspecified organism: Secondary | ICD-10-CM | POA: Diagnosis not present

## 2020-02-18 DIAGNOSIS — Z79899 Other long term (current) drug therapy: Secondary | ICD-10-CM

## 2020-02-18 DIAGNOSIS — Z833 Family history of diabetes mellitus: Secondary | ICD-10-CM

## 2020-02-18 DIAGNOSIS — N1832 Chronic kidney disease, stage 3b: Secondary | ICD-10-CM

## 2020-02-18 DIAGNOSIS — I5023 Acute on chronic systolic (congestive) heart failure: Secondary | ICD-10-CM | POA: Diagnosis not present

## 2020-02-18 DIAGNOSIS — I130 Hypertensive heart and chronic kidney disease with heart failure and stage 1 through stage 4 chronic kidney disease, or unspecified chronic kidney disease: Secondary | ICD-10-CM | POA: Diagnosis present

## 2020-02-18 DIAGNOSIS — E785 Hyperlipidemia, unspecified: Secondary | ICD-10-CM | POA: Diagnosis not present

## 2020-02-18 DIAGNOSIS — I421 Obstructive hypertrophic cardiomyopathy: Secondary | ICD-10-CM | POA: Diagnosis not present

## 2020-02-18 DIAGNOSIS — J810 Acute pulmonary edema: Secondary | ICD-10-CM | POA: Diagnosis not present

## 2020-02-18 DIAGNOSIS — I5033 Acute on chronic diastolic (congestive) heart failure: Secondary | ICD-10-CM | POA: Diagnosis not present

## 2020-02-18 DIAGNOSIS — I472 Ventricular tachycardia: Secondary | ICD-10-CM | POA: Diagnosis not present

## 2020-02-18 DIAGNOSIS — I110 Hypertensive heart disease with heart failure: Secondary | ICD-10-CM | POA: Diagnosis not present

## 2020-02-18 DIAGNOSIS — Z8249 Family history of ischemic heart disease and other diseases of the circulatory system: Secondary | ICD-10-CM

## 2020-02-18 DIAGNOSIS — Z20822 Contact with and (suspected) exposure to covid-19: Secondary | ICD-10-CM | POA: Diagnosis not present

## 2020-02-18 DIAGNOSIS — I5032 Chronic diastolic (congestive) heart failure: Secondary | ICD-10-CM | POA: Diagnosis not present

## 2020-02-18 DIAGNOSIS — I701 Atherosclerosis of renal artery: Secondary | ICD-10-CM | POA: Diagnosis not present

## 2020-02-18 DIAGNOSIS — I10 Essential (primary) hypertension: Secondary | ICD-10-CM | POA: Diagnosis not present

## 2020-02-18 DIAGNOSIS — N179 Acute kidney failure, unspecified: Secondary | ICD-10-CM | POA: Diagnosis present

## 2020-02-18 DIAGNOSIS — I517 Cardiomegaly: Secondary | ICD-10-CM | POA: Diagnosis not present

## 2020-02-18 DIAGNOSIS — R05 Cough: Secondary | ICD-10-CM | POA: Diagnosis not present

## 2020-02-18 DIAGNOSIS — J811 Chronic pulmonary edema: Secondary | ICD-10-CM | POA: Diagnosis not present

## 2020-02-18 LAB — CBC WITH DIFFERENTIAL/PLATELET
Abs Immature Granulocytes: 0.04 10*3/uL (ref 0.00–0.07)
Basophils Absolute: 0 10*3/uL (ref 0.0–0.1)
Basophils Relative: 0 %
Eosinophils Absolute: 0.1 10*3/uL (ref 0.0–0.5)
Eosinophils Relative: 2 %
HCT: 39 % (ref 39.0–52.0)
Hemoglobin: 11.9 g/dL — ABNORMAL LOW (ref 13.0–17.0)
Immature Granulocytes: 1 %
Lymphocytes Relative: 12 %
Lymphs Abs: 0.9 10*3/uL (ref 0.7–4.0)
MCH: 32 pg (ref 26.0–34.0)
MCHC: 30.5 g/dL (ref 30.0–36.0)
MCV: 104.8 fL — ABNORMAL HIGH (ref 80.0–100.0)
Monocytes Absolute: 0.4 10*3/uL (ref 0.1–1.0)
Monocytes Relative: 5 %
Neutro Abs: 6 10*3/uL (ref 1.7–7.7)
Neutrophils Relative %: 80 %
Platelets: 175 10*3/uL (ref 150–400)
RBC: 3.72 MIL/uL — ABNORMAL LOW (ref 4.22–5.81)
RDW: 13.1 % (ref 11.5–15.5)
WBC: 7.4 10*3/uL (ref 4.0–10.5)
nRBC: 0 % (ref 0.0–0.2)

## 2020-02-18 LAB — BRAIN NATRIURETIC PEPTIDE: B Natriuretic Peptide: 1163.2 pg/mL — ABNORMAL HIGH (ref 0.0–100.0)

## 2020-02-18 LAB — COMPREHENSIVE METABOLIC PANEL
ALT: 16 U/L (ref 0–44)
AST: 15 U/L (ref 15–41)
Albumin: 3.2 g/dL — ABNORMAL LOW (ref 3.5–5.0)
Alkaline Phosphatase: 74 U/L (ref 38–126)
Anion gap: 9 (ref 5–15)
BUN: 28 mg/dL — ABNORMAL HIGH (ref 8–23)
CO2: 24 mmol/L (ref 22–32)
Calcium: 9 mg/dL (ref 8.9–10.3)
Chloride: 107 mmol/L (ref 98–111)
Creatinine, Ser: 2.23 mg/dL — ABNORMAL HIGH (ref 0.61–1.24)
GFR calc Af Amer: 33 mL/min — ABNORMAL LOW (ref >60)
GFR calc non Af Amer: 29 mL/min — ABNORMAL LOW (ref >60)
Glucose, Bld: 213 mg/dL — ABNORMAL HIGH (ref 70–99)
Potassium: 4 mmol/L (ref 3.5–5.1)
Sodium: 140 mmol/L (ref 135–145)
Total Bilirubin: 1.1 mg/dL (ref 0.3–1.2)
Total Protein: 7.4 g/dL (ref 6.5–8.1)

## 2020-02-18 LAB — TROPONIN I (HIGH SENSITIVITY)
Troponin I (High Sensitivity): 70 ng/L — ABNORMAL HIGH (ref <18)
Troponin I (High Sensitivity): 70 ng/L — ABNORMAL HIGH (ref <18)

## 2020-02-18 LAB — SARS CORONAVIRUS 2 BY RT PCR (HOSPITAL ORDER, PERFORMED IN ~~LOC~~ HOSPITAL LAB): SARS Coronavirus 2: NEGATIVE

## 2020-02-18 LAB — CBG MONITORING, ED: Glucose-Capillary: 114 mg/dL — ABNORMAL HIGH (ref 70–99)

## 2020-02-18 MED ORDER — SODIUM CHLORIDE 0.9% FLUSH
3.0000 mL | Freq: Two times a day (BID) | INTRAVENOUS | Status: DC
  Administered 2020-02-20: 3 mL via INTRAVENOUS

## 2020-02-18 MED ORDER — NITROGLYCERIN IN D5W 200-5 MCG/ML-% IV SOLN
0.0000 ug/min | INTRAVENOUS | Status: DC
  Administered 2020-02-19: 100 ug/min via INTRAVENOUS
  Filled 2020-02-18: qty 250

## 2020-02-18 MED ORDER — MORPHINE SULFATE (PF) 2 MG/ML IV SOLN
1.0000 mg | INTRAVENOUS | Status: DC | PRN
  Administered 2020-02-18: 2 mg via INTRAVENOUS
  Filled 2020-02-18: qty 1

## 2020-02-18 MED ORDER — AMLODIPINE BESYLATE 5 MG PO TABS
5.0000 mg | ORAL_TABLET | Freq: Every day | ORAL | Status: DC
  Administered 2020-02-18 – 2020-02-21 (×4): 5 mg via ORAL
  Filled 2020-02-18 (×4): qty 1

## 2020-02-18 MED ORDER — ACETAMINOPHEN 325 MG PO TABS: 650.0000 mg | ORAL_TABLET | Freq: Four times a day (QID) | ORAL | Status: DC | PRN

## 2020-02-18 MED ORDER — HYDRALAZINE HCL 50 MG PO TABS
100.0000 mg | ORAL_TABLET | Freq: Two times a day (BID) | ORAL | Status: DC
  Administered 2020-02-18 – 2020-02-21 (×7): 100 mg via ORAL
  Filled 2020-02-18: qty 4
  Filled 2020-02-18 (×6): qty 2

## 2020-02-18 MED ORDER — ONDANSETRON HCL 4 MG/2ML IJ SOLN: 4.0000 mg | Freq: Four times a day (QID) | INTRAMUSCULAR | Status: DC | PRN

## 2020-02-18 MED ORDER — ENOXAPARIN SODIUM 30 MG/0.3ML ~~LOC~~ SOLN
30.0000 mg | SUBCUTANEOUS | Status: DC
  Administered 2020-02-18 – 2020-02-20 (×3): 30 mg via SUBCUTANEOUS
  Filled 2020-02-18 (×3): qty 0.3

## 2020-02-18 MED ORDER — DOCUSATE SODIUM 100 MG PO CAPS
100.0000 mg | ORAL_CAPSULE | Freq: Two times a day (BID) | ORAL | Status: DC
  Administered 2020-02-18 – 2020-02-21 (×6): 100 mg via ORAL
  Filled 2020-02-18 (×6): qty 1

## 2020-02-18 MED ORDER — INSULIN ASPART 100 UNIT/ML ~~LOC~~ SOLN
0.0000 [IU] | Freq: Three times a day (TID) | SUBCUTANEOUS | Status: DC
  Administered 2020-02-20: 2 [IU] via SUBCUTANEOUS

## 2020-02-18 MED ORDER — LATANOPROST 0.005 % OP SOLN
1.0000 [drp] | Freq: Every day | OPHTHALMIC | Status: DC
  Administered 2020-02-18 – 2020-02-20 (×2): 1 [drp] via OPHTHALMIC
  Filled 2020-02-18: qty 2.5

## 2020-02-18 MED ORDER — NITROGLYCERIN 0.4 MG SL SUBL: 0.4000 mg | SUBLINGUAL_TABLET | SUBLINGUAL | Status: DC | PRN

## 2020-02-18 MED ORDER — ATORVASTATIN CALCIUM 10 MG PO TABS
20.0000 mg | ORAL_TABLET | Freq: Every day | ORAL | Status: DC
  Administered 2020-02-18 – 2020-02-21 (×4): 20 mg via ORAL
  Filled 2020-02-18 (×4): qty 2

## 2020-02-18 MED ORDER — BRIMONIDINE TARTRATE 0.2 % OP SOLN
1.0000 [drp] | Freq: Two times a day (BID) | OPHTHALMIC | Status: DC
  Administered 2020-02-18 – 2020-02-21 (×6): 1 [drp] via OPHTHALMIC
  Filled 2020-02-18: qty 5

## 2020-02-18 MED ORDER — CLONIDINE HCL 0.1 MG PO TABS
0.1000 mg | ORAL_TABLET | Freq: Three times a day (TID) | ORAL | Status: DC
  Administered 2020-02-18 – 2020-02-21 (×8): 0.1 mg via ORAL
  Filled 2020-02-18 (×9): qty 1

## 2020-02-18 MED ORDER — CARVEDILOL 25 MG PO TABS
25.0000 mg | ORAL_TABLET | Freq: Two times a day (BID) | ORAL | Status: DC
  Administered 2020-02-18 – 2020-02-19 (×2): 25 mg via ORAL
  Filled 2020-02-18: qty 2
  Filled 2020-02-18: qty 1
  Filled 2020-02-18: qty 2

## 2020-02-18 MED ORDER — FUROSEMIDE 10 MG/ML IJ SOLN
40.0000 mg | Freq: Once | INTRAMUSCULAR | Status: AC
  Administered 2020-02-18: 40 mg via INTRAVENOUS
  Filled 2020-02-18: qty 4

## 2020-02-18 MED ORDER — ONDANSETRON HCL 4 MG PO TABS: 4.0000 mg | ORAL_TABLET | Freq: Four times a day (QID) | ORAL | Status: DC | PRN

## 2020-02-18 MED ORDER — INSULIN GLARGINE 100 UNIT/ML ~~LOC~~ SOLN
18.0000 [IU] | Freq: Every day | SUBCUTANEOUS | Status: DC
  Administered 2020-02-18 – 2020-02-20 (×3): 18 [IU] via SUBCUTANEOUS
  Filled 2020-02-18 (×4): qty 0.18

## 2020-02-18 MED ORDER — INSULIN ASPART 100 UNIT/ML ~~LOC~~ SOLN: 0.0000 [IU] | Freq: Every day | SUBCUTANEOUS | Status: DC

## 2020-02-18 MED ORDER — ACETAMINOPHEN 650 MG RE SUPP: 650.0000 mg | Freq: Four times a day (QID) | RECTAL | Status: DC | PRN

## 2020-02-18 MED ORDER — NITROGLYCERIN IN D5W 200-5 MCG/ML-% IV SOLN
0.0000 ug/min | INTRAVENOUS | Status: DC
  Administered 2020-02-18: 40 ug/min via INTRAVENOUS
  Administered 2020-02-18: 140 ug/min via INTRAVENOUS
  Administered 2020-02-18: 150 ug/min via INTRAVENOUS
  Filled 2020-02-18 (×2): qty 250

## 2020-02-18 NOTE — ED Provider Notes (Addendum)
[facility] EMERGENCY DEPARTMENT Provider Note   CSN: [id number] Arrival date & time: 02/18/20  0865     History Chief Complaint  Patient presents with  . Shortness of Breath    Robert Chaney is a 69 y.o. male.  HPI   69 year old male with a history of anemia, CKD, diabetes, CHF, hyperlipidemia, hypertension, hypertrophic cardiomyopathy, nonsustained V. tach, PVCs, SVT, who presents to the emergency department today for evaluation of shortness of breath.  Patient states he started feeling shortness of breath around midnight last night.  He also reports a nonproductive cough.  He has had some orthopnea as well and did have some chest pressure earlier this morning when he was coughing.  He reports about 1 month history of bilateral lower extremity swelling.  He states he has been compliant with his Lasix at home.  He does not wear oxygen at home.  He has not had any fever or other upper respiratory symptoms.  He has been fully vaccinated against Covid.  He denies any hemoptysis or calf pain.  Past Medical History:  Diagnosis Date  . Altered mental status    a. 05/2017 - adm with blurred vision, somnolence in setting of AKI and high blood sugar.  . Anemia   . CKD (chronic kidney disease), stage III   . Diabetes mellitus   . Diastolic CHF, chronic (HCC)    A.  03/2009 Echo: EF 60-65%, Gr II diast dysfxn  . Elevated troponin    a. 2013 - troponin 1.4. b. 2019 - troponin 0.32; neg nuc 07/2017.  . Glaucoma   . Hyperlipidemia    HYPERCHOLESTEROLEMIA  . Hypertension    MARKED LEFT VENTRICULAR HYPERTROPHY BY PREVIOUS ECHOCARDIOGRAM--HE HAS HYPERDYNAMIC LEFT VENTRICULAR SYSTOLIC FUNCTION AND HAS IMPAIRED RELAXATION BY ECHO  . Hypertrophic cardiomyopathy ([place])   . NSVT (nonsustained ventricular tachycardia) ([place])   . Premature atrial contractions   . PVC's (premature ventricular contractions)   . SVT (supraventricular tachycardia) [facility])     Patient Active Problem List     Diagnosis Date Noted  . Hypertensive crisis 02/18/2020  . Acute exacerbation of CHF (congestive heart failure) ([place]) 01/11/2020  . Hypertensive emergency 01/11/2020  . Acute respiratory failure ([place]) 01/11/2020  . Type 2 diabetes mellitus with retinopathy, with long-term current use of insulin ([place]) 07/16/2019  . Type 2 diabetes mellitus with stage 3a chronic kidney disease, with long-term current use of insulin ([place]) 07/16/2019  . Type 2 diabetes mellitus with diabetic polyneuropathy, with long-term current use of insulin ([place]) 07/16/2019  . OSA (obstructive sleep apnea) 03/30/2019  . Hyperglycemia 07/31/2017  . Near syncope 07/31/2017  . Acute-on-chronic kidney injury ([place]) 07/31/2017  . Acute renal injury ([place]) 06/09/2017  . Hypertensive heart disease 11/07/2016  . Hypertrophic cardiomyopathy ([place]) 07/26/2015  . Dyslipidemia 05/19/2013  . Diabetic hyperosmolar non-ketotic state ([place]) 07/19/2011  . DM (diabetes mellitus) with complications ([place]) 78/46/9629  . Acute on chronic renal failure ([place]) 07/19/2011  . Troponin level elevated 07/19/2011  . Prolonged QT interval 07/19/2011  . Hyperkalemia 07/19/2011  . Volume depletion 07/19/2011  . Diastolic CHF, chronic ([place]) 07/19/2011  . Essential hypertension 07/19/2011  . Elevated CPK 07/19/2011  . Hypercholesterolemia 02/20/2011  . Benign hypertensive heart disease without heart failure 02/20/2011    Past Surgical History:  Procedure Laterality Date  . NO PAST SURGERIES         Family History  Problem Relation Age of Onset  . Hypertension Father   . Stroke Father   .  Hypertension Mother   . Diabetes Brother   . Hypertension Brother   . Diabetes Brother     Social History   Tobacco Use  . Smoking status: Never Smoker  . Smokeless tobacco: Never Used  Vaping Use  . Vaping Use: Never used  Substance Use Topics  . Alcohol use: No  . Drug use: No    Home Medications Prior to Admission medications   Medication  Sig Start Date End Date Taking? Authorizing Provider  amLODipine (NORVASC) 5 MG tablet Take 1 tablet (5 mg total) by mouth at bedtime. 11/26/19   Skeet Latch, MD  atorvastatin (LIPITOR) 20 MG tablet Take 1 tablet (20 mg total) by mouth daily. 03/16/18   Dorothy Spark, MD  brimonidine (ALPHAGAN) 0.2 % ophthalmic solution Place 1 drop into the right eye 2 (two) times daily. 10/06/19   [provider]  carvedilol (COREG) 25 MG tablet Take 1 tablet (25 mg total) by mouth 2 (two) times daily. 10/06/19   Skeet Latch, MD  cholecalciferol (VITAMIN D3) 25 MCG (1000 UT) tablet Take 1,000 Units by mouth daily.    [provider]  cloNIDine (CATAPRES) 0.1 MG tablet Take 0.1 mg by mouth 3 (three) times daily.    [provider]  diclofenac Sodium (VOLTAREN) 1 % GEL Apply 4 g topically 4 (four) times daily as needed for pain. 02/07/20   [provider]  furosemide (LASIX) 20 MG tablet Take 2 tablets (40 mg total) by mouth daily. 01/16/20   Alma Friendly, MD  glucose blood (ONETOUCH VERIO) test strip Use as instructed to test blood sugar 3 times daily E11.65 03/12/19   Shamleffer, Melanie Crazier, MD  hydrALAZINE (APRESOLINE) 100 MG tablet Take 1 tablet (100 mg total) by mouth 2 (two) times daily. 09/14/19   Imogene Burn, PA-C  insulin aspart (NOVOLOG) 100 UNIT/ML injection Inject 0-6 Units into the skin 3 (three) times daily with meals. Sliding scale 09/02/19   [provider]  Insulin Glargine (TOUJEO MAX SOLOSTAR) 300 UNIT/ML SOPN Inject 18 Units into the skin at bedtime.    [provider]  Insulin Pen Needle (B-D UF III MINI PEN NEEDLES) 31G X 5 MM MISC Four times daily 06/30/18   Shamleffer, Melanie Crazier, MD  isosorbide mononitrate (IMDUR) 60 MG 24 hr tablet Take 1 tablet (60 mg total) by mouth daily. 03/16/18   Dorothy Spark, MD  latanoprost (XALATAN) 0.005 % ophthalmic solution Place 1 drop into the left eye at bedtime. 10/06/19    [provider]  nitroGLYCERIN (NITROSTAT) 0.4 MG SL tablet Place 1 tablet (0.4 mg total) under the tongue every 5 (five) minutes as needed for chest pain. 03/29/16   Dorothy Spark, MD    Allergies    Aspirin  Review of Systems   Review of Systems  Constitutional: Negative for chills and fever.  HENT: Negative for ear pain and sore throat.   Eyes: Negative for visual disturbance.  Respiratory: Positive for cough and shortness of breath.   Cardiovascular: Positive for chest pain and leg swelling.  Gastrointestinal: Negative for abdominal pain, constipation, diarrhea, nausea and vomiting.  Genitourinary: Negative for dysuria and hematuria.  Musculoskeletal: Negative for back pain.  Skin: Negative for rash.  Neurological: Negative for headaches.  All other systems reviewed and are negative.   Physical Exam Updated Vital Signs BP (!) 176/88   Pulse 63   Temp 98.1 F (36.7 C) (Oral)   Resp 12   Ht 5'  8" (1.727 m)   Wt 81.6 kg   SpO2 99%   BMI 27.37 kg/m   Physical Exam Vitals and nursing note reviewed.  Constitutional:      Appearance: He is well-developed.  HENT:     Head: Normocephalic and atraumatic.  Eyes:     Conjunctiva/sclera: Conjunctivae normal.  Cardiovascular:     Rate and Rhythm: Normal rate and regular rhythm.     Heart sounds: Normal heart sounds. No murmur heard.   Pulmonary:     Effort: Pulmonary effort is normal. No respiratory distress.     Breath sounds: Examination of the right-lower field reveals rales. Examination of the left-lower field reveals rales. Rales present.     Comments: Mild tachypnea Abdominal:     Palpations: Abdomen is soft.     Tenderness: There is no abdominal tenderness. There is no guarding.  Musculoskeletal:     Cervical back: Neck supple.     Comments: 2+ pitting edema to the BLE  Skin:    General: Skin is warm and dry.  Neurological:     Mental Status: He is alert.     ED Results / Procedures /  Treatments   Labs (all labs ordered are listed, but only abnormal results are displayed) Labs Reviewed  CBC WITH DIFFERENTIAL/PLATELET - Abnormal; Notable for the following components:      Result Value   RBC 3.72 (*)    Hemoglobin 11.9 (*)    MCV 104.8 (*)    All other components within normal limits  COMPREHENSIVE METABOLIC PANEL - Abnormal; Notable for the following components:   Glucose, Bld 213 (*)    BUN 28 (*)    Creatinine, Ser 2.23 (*)    Albumin 3.2 (*)    GFR calc non Af Amer 29 (*)    GFR calc Af Amer 33 (*)    All other components within normal limits  BRAIN NATRIURETIC PEPTIDE - Abnormal; Notable for the following components:   B Natriuretic Peptide 1,163.2 (*)    All other components within normal limits  TROPONIN I (HIGH SENSITIVITY) - Abnormal; Notable for the following components:   Troponin I (High Sensitivity) 70 (*)    All other components within normal limits  TROPONIN I (HIGH SENSITIVITY) - Abnormal; Notable for the following components:   Troponin I (High Sensitivity) 70 (*)    All other components within normal limits  SARS CORONAVIRUS 2 BY RT PCR [facility] ORDER, [place] LAB)    EKG EKG Interpretation  Date/Time:  Friday February 18 2020 07:45:16 EDT Ventricular Rate:  73 PR Interval:    QRS Duration: 87 QT Interval:  427 QTC Calculation: 471 R Axis:   83 Text Interpretation: Sinus rhythm Probable left atrial enlargement Borderline right axis deviation Left ventricular hypertrophy Nonspecific T abnormalities, lateral leads Confirmed by Quintella Reichert [phone]) on 02/18/2020 7:46:44 AM   Radiology DG Chest Portable 1 View  Result Date: 02/18/2020 CLINICAL DATA:  Cough.  Shortness of breath. EXAM: PORTABLE CHEST 1 VIEW COMPARISON:  Chest x-ray 01/15/2020. FINDINGS: Cardiomegaly with pulmonary venous congestion. Bilateral pulmonary infiltrates/edema. Previously identified nodule noted over the left lung base not visualized due  to pulmonary infiltrates/edema. No pleural effusion or pneumothorax. IMPRESSION: 1. Cardiomegaly with diffuse bilateral pulmonary infiltrates/edema. CHF could present this fashion. Mild lateral pneumonia cannot be excluded. 2. Previously identified left base pulmonary nodule not identified due to bilateral pulmonary infiltrates/edema. Continued follow-up exam suggested. Electronically Signed   By: Marcello Moores  Register  On: 02/18/2020 07:39    Procedures Procedures (including critical care time) CRITICAL CARE Performed by: Rodney Booze   Total critical care time: 40 minutes  Critical care time was exclusive of separately billable procedures and treating other patients.  Critical care was necessary to treat or prevent imminent or life-threatening deterioration.  Critical care was time spent personally by me on the following activities: development of treatment plan with patient and/or surrogate as well as nursing, discussions with consultants, evaluation of patient's response to treatment, examination of patient, obtaining history from patient or surrogate, ordering and performing treatments and interventions, ordering and review of laboratory studies, ordering and review of radiographic studies, pulse oximetry and re-evaluation of patient's condition.   Medications Ordered in ED Medications  nitroGLYCERIN 50 mg in dextrose 5 % 250 mL (0.2 mg/mL) infusion (80 mcg/min Intravenous Rate/Dose Change 02/18/20 1457)  amLODipine (NORVASC) tablet 5 mg (has no administration in time range)  carvedilol (COREG) tablet 25 mg (has no administration in time range)  cloNIDine (CATAPRES) tablet 0.1 mg (has no administration in time range)  hydrALAZINE (APRESOLINE) tablet 100 mg (has no administration in time range)  furosemide (LASIX) injection 40 mg (40 mg Intravenous Given 02/18/20 0818)    ED Course  I have reviewed the triage vital signs and the nursing notes.  Pertinent labs & imaging results that  were available during my care of the patient were reviewed by me and considered in my medical decision making (see chart for details).    MDM Rules/Calculators/A&P                          69 y/o M with a h/o CHF presenting with sob, ble edema, orthopnea.  He was reportedly satting at 68% on RA with EMS and was placed on an NRB with improvement of sats to 100%. On my eval he was satting at 99% with mild tachypnea on 2L [place]  Reviewed/interpreted labs CBC with no leukocytosis, mild anemia which is stable.  CMP elevated BUN/Cr consistent with prior. LFTs wnl.  Trop elevated at 70, suspect due to demand. Prior trops elevated in this range as well.  BNP elevated at 1163 COVID negative  EKG with NSR, Probable left atrial enlargement Borderline right axis deviation Left ventricular hypertrophy Nonspecific T abnormalities, lateral leads   CXR reviewed/interpreted - cardiomegaly with diffuse pulmonary infiltrates/edema which could be consistent with CHF   On reassessement, pt sattingat 89% on 2L. Increased to 5L but pt continued to be tachypneic therefore bipap was ordered. NTG drip was ordered for HTN and pulm edema. IV lasix was given as well. Pt severely ill with likely CHF exacerbation, will admit.   10:24 AM CONSULT with cardiology. Spoke with Wannetta Sender who states cardiology will come see the patient.   Discussed case with Dr. Harrell Gave who evaluated the patient and recommends admission to medicine.  CONSULT with Dr. Lorin Mercy who accepts patient for admission.   Final Clinical Impression(s) / ED Diagnoses Final diagnoses:  Acute on chronic congestive heart failure, unspecified heart failure type [facility])    Rx / DC Orders ED Discharge Orders    None       Rodney Booze, PA-C 02/18/20 1502    Taygan Connell S, PA-C 02/18/20 1533    Quintella Reichert, MD 02/18/20 1600

## 2020-02-18 NOTE — ED Notes (Signed)
Spoke to Dr. Lorin Mercy. She stated that she was good with the Pt being 160-180's that where he lives. Pt now within  Range

## 2020-02-18 NOTE — ED Notes (Signed)
Paused Nitro dropped to 033 systolic

## 2020-02-18 NOTE — ED Notes (Signed)
Pt had initially did well with Nitro and moved down to 199'O to 129'K systolic. However after a couple of hours the Pt's systolic started to trended back up. Dr. Ralene Bathe Notified earlier and have been slowly tritating Nitro back up. Will continue to montior

## 2020-02-18 NOTE — ED Triage Notes (Signed)
Patient arrived EMS from home for SOB for the past few hours with active cough. ON NRB 15 L with EMS. EMS reported 68% on RA. Not on oxygen at home.   202/94 70HR 10 RR 100% 15 NRB; 68% RA

## 2020-02-18 NOTE — Consult Note (Signed)
Cardiology Consult note:   Patient ID: Robert Chaney MRN: [id number]; DOB: [birth year]   Admission date: 02/18/2020  Primary Care Provider: [name], MD [facility] Cardiologist: Ena Dawley, MD  Hypertension clinic: Dr. Oval Linsey  Chief Complaint:  SOB  Patient Profile:   Robert Chaney is a 69 y.o. male with hypertrophic cardiomyopathy, chronic diastolic heart failure, chronic kidney disease stage IV, hypertension followed in hypertension clinic, hyperlipidemia, obstructive sleep apnea on CPAP, and obesity brought by EMS for hypoxia and Cardiology asked to evaluate patient for evaluation of possible CHF at the request of Dr. Ralene Bathe.  Echocardiographic evaluation 2017 was suspicious for HCM.  He underwent cMRI confirming HCM with a basal septum of 21 mm patchy LGE over repeated assessments has had variability of the presence of of an outflow gradient.  No hx of syncope.   Low risk stress test February 2019.  Echocardiogram July 2021 with LV function of 60 to 77%, grade 2 diastolic dysfunction. No significant LVOT gradient on  this study (HR 63 bpm).   History of Present Illness:   Robert Chaney had sudden onset shortness of breath with cough early this morning.  May be some associated shortness of breath.  EMS found him hypoxic at 68% on room air.  Oxygenation improved on nonrebreather.  Patient reports having lower extremity edema recently.  Denies excess salt intake.  Takes his medication.  No chest pain or palpitations.  Due to persistent hypoxia in ER on supplemental oxygen he was placed on BiPAP with improvement.  BNP 1163.  High-sensitivity troponin 70x2.  Serum creatinine 2.23. COVID negative. Chest X ray: IMPRESSION: 1. Cardiomegaly with diffuse bilateral pulmonary infiltrates/edema. CHF could present this fashion. Mild lateral pneumonia cannot be excluded.  2. Previously identified left base pulmonary nodule not identified due to bilateral pulmonary infiltrates/edema.  Continued follow-up exam suggested.  Given IV Lasix 40 mg x 1>> with almost 1 L diuresis Due to hypertensive urgency he was placed on nitroglycerin drip.  Past Medical History:  Diagnosis Date  . Altered mental status    a. 05/2017 - adm with blurred vision, somnolence in setting of AKI and high blood sugar.  . Anemia   . CKD (chronic kidney disease), stage III   . Diabetes mellitus   . Diastolic CHF, chronic (HCC)    A.  03/2009 Echo: EF 60-65%, Gr II diast dysfxn  . Elevated troponin    a. 2013 - troponin 1.4. b. 2019 - troponin 0.32; neg nuc 07/2017.  . Glaucoma   . Hyperlipidemia    HYPERCHOLESTEROLEMIA  . Hypertension    MARKED LEFT VENTRICULAR HYPERTROPHY BY PREVIOUS ECHOCARDIOGRAM--HE HAS HYPERDYNAMIC LEFT VENTRICULAR SYSTOLIC FUNCTION AND HAS IMPAIRED RELAXATION BY ECHO  . Hypertrophic cardiomyopathy ([place])   . NSVT (nonsustained ventricular tachycardia) ([place])   . Premature atrial contractions   . PVC's (premature ventricular contractions)   . SVT (supraventricular tachycardia) (HCC)     Past Surgical History:  Procedure Laterality Date  . NO PAST SURGERIES       Medications Prior to Admission: Prior to Admission medications   Medication Sig Start Date End Date Taking? Authorizing Provider  amLODipine (NORVASC) 5 MG tablet Take 1 tablet (5 mg total) by mouth at bedtime. 11/26/19   Skeet Latch, MD  atorvastatin (LIPITOR) 20 MG tablet Take 1 tablet (20 mg total) by mouth daily. 03/16/18   Dorothy Spark, MD  brimonidine (ALPHAGAN) 0.2 % ophthalmic solution Place 1 drop into the right eye 2 (two) times daily.  10/06/19   [provider]  carvedilol (COREG) 25 MG tablet Take 1 tablet (25 mg total) by mouth 2 (two) times daily. 10/06/19   Skeet Latch, MD  cholecalciferol (VITAMIN D3) 25 MCG (1000 UT) tablet Take 1,000 Units by mouth daily.    [provider]  cloNIDine (CATAPRES) 0.1 MG tablet Take 0.1 mg by mouth 3 (three) times daily.     [provider]  diclofenac Sodium (VOLTAREN) 1 % GEL Apply 4 g topically 4 (four) times daily as needed for pain. 02/07/20   [provider]  furosemide (LASIX) 20 MG tablet Take 2 tablets (40 mg total) by mouth daily. 01/16/20   Alma Friendly, MD  glucose blood (ONETOUCH VERIO) test strip Use as instructed to test blood sugar 3 times daily E11.65 03/12/19   Shamleffer, Melanie Crazier, MD  hydrALAZINE (APRESOLINE) 100 MG tablet Take 1 tablet (100 mg total) by mouth 2 (two) times daily. 09/14/19   Imogene Burn, PA-C  insulin aspart (NOVOLOG) 100 UNIT/ML injection Inject 0-6 Units into the skin 3 (three) times daily with meals. Sliding scale 09/02/19   [provider]  Insulin Glargine (TOUJEO MAX SOLOSTAR) 300 UNIT/ML SOPN Inject 18 Units into the skin at bedtime.    [provider]  Insulin Pen Needle (B-D UF III MINI PEN NEEDLES) 31G X 5 MM MISC Four times daily 06/30/18   Shamleffer, Melanie Crazier, MD  isosorbide mononitrate (IMDUR) 60 MG 24 hr tablet Take 1 tablet (60 mg total) by mouth daily. 03/16/18   Dorothy Spark, MD  latanoprost (XALATAN) 0.005 % ophthalmic solution Place 1 drop into the left eye at bedtime. 10/06/19   [provider]  nitroGLYCERIN (NITROSTAT) 0.4 MG SL tablet Place 1 tablet (0.4 mg total) under the tongue every 5 (five) minutes as needed for chest pain. 03/29/16   Dorothy Spark, MD     Allergies:    Allergies  Allergen Reactions  . Aspirin Itching    Social History:   Social History   Socioeconomic History  . Marital status: Legally Separated    Spouse name: Not on file  . Number of children: Not on file  . Years of education: Not on file  . Highest education level: Not on file  Occupational History  . Occupation: Forensic psychologist: [employer]    Comment: At [employer]  . Smoking status: Never Smoker  . Smokeless tobacco: Never Used  Vaping Use  . Vaping Use: Never used    Substance and Sexual Activity  . Alcohol use: No  . Drug use: No  . Sexual activity: Never  Other Topics Concern  . Not on file  Social History Narrative   Originally from [place].    Social Determinants of Health   Financial Resource Strain:   . Difficulty of Paying Living Expenses: Not on file  Food Insecurity:   . Worried About Charity fundraiser in the Last Year: Not on file  . Ran Out of Food in the Last Year: Not on file  Transportation Needs:   . Lack of Transportation (Medical): Not on file  . Lack of Transportation (Non-Medical): Not on file  Physical Activity:   . Days of Exercise per Week: Not on file  . Minutes of Exercise per Session: Not on file  Stress:   . Feeling of Stress : Not on file  Social Connections:   . Frequency of Communication with Friends  and Family: Not on file  . Frequency of Social Gatherings with Friends and Family: Not on file  . Attends Religious Services: Not on file  . Active Member of Clubs or Organizations: Not on file  . Attends Archivist Meetings: Not on file  . Marital Status: Not on file  Intimate Partner Violence:   . Fear of Current or Ex-Partner: Not on file  . Emotionally Abused: Not on file  . Physically Abused: Not on file  . Sexually Abused: Not on file    Family History:   The patient's family history includes Diabetes in his brother and brother; Hypertension in his brother, father, and mother; Stroke in his father.    ROS:  Please see the history of present illness. All other ROS reviewed and negative.     Physical Exam/Data:   Vitals:   02/18/20 1030 02/18/20 1130 02/18/20 1145 02/18/20 1200  BP: (!) 169/88 (!) 158/90 (!) 164/86 (!) 175/89  Pulse: 65 63 64 68  Resp: (!) 29 15 (!) 25 (!) 21  Temp:      TempSrc:      SpO2: 98% 98% 98% 99%  Weight:      Height:        Intake/Output Summary (Last 24 hours) at 02/18/2020 1303 Last data filed at 02/18/2020 1200 Gross per 24 hour  Intake --   Output 900 ml  Net -900 ml   Last 3 Weights 02/18/2020 02/11/2020 01/16/2020  Weight (lbs) 180 lb 185 lb 6.4 oz 178 lb 1.6 oz  Weight (kg) 81.647 kg 84.097 kg 80.786 kg     Body mass index is 27.37 kg/m.  General:  Well nourished, well developed, in no acute distress HEENT: normal Lymph: no adenopathy Neck: no JVD Endocrine:  No thryomegaly Vascular: No carotid bruits; FA pulses 2+ bilaterally without bruits  Cardiac:  normal S1, S2; RRR; no murmur Lungs: Diminished breath sounds at base Abd: soft, nontender, no hepatomegaly  Ext: Trace edema right greater than left  Musculoskeletal:  No deformities, BUE and BLE strength normal and equal Skin: warm and dry  Neuro:  CNs 2-12 intact, no focal abnormalities noted Psych:  Normal affect    EKG:  The ECG that was done today was personally reviewed and demonstrates : Normal sinus rhythm with LVH and chronic nonspecific T wave inversion laterally  Relevant CV Studies:  Cardiac MRI 10/2016 IMPRESSION: 1. Normal left ventricular size with severe left ventricular hypertrophy with basal septal thickness of 21 mm and normal systolic function (LVEF = 62%). There are no regional wall motion abnormalities.  There are diffuse patchy late gadolinium enhancement in the basal anterior, anteroseptal, inferior, inferolateral, mid anterior, inferior, inferolateral and apical inferior walls.  2.  Normal right ventricular size, thickness and systolic volume.  3.  Mild mitral and tricuspid regurgitation.  Collectively, these findings are consistent with hypertrophic cardiomyopathy with high risk feature - presence of LGE in the 8 myocardial segments.  Stress test 07/2017  Electrically nondiagnositic due to baseline LVH with strain  Probable normal perfusion and soft tissue attenuation (diaphragm) No ischemia  LVEF 585  Low risk scan    Monitor 04/2019 10 day event monitor, no symptoms. Predominant rhythm was sinus rhythm with  average rate 70 BPM. 18 Supraventricular Tachycardia runs, the longest lasting 30 seconds with HR 156 BPM.  Rare PVCs (< 1%), 3 couplets and 1 triplet. No ventricular arrhythmias.   Echo 12/2019 1. Severe asymmetric LVH of the basal septum up  to 1.8 cm. Prior history  of HOCM. No chordal or mitral valve SAM. No significant LVOT gradient on  this study (HR 63 bpm). No provocative maneuvers performed. Left  ventricular ejection fraction, by  estimation, is 60 to 65%. The left ventricle has normal function. The left  ventricle has no regional wall motion abnormalities. There is severe  asymmetric left ventricular hypertrophy of the basal-septal segment. Left  ventricular diastolic parameters are  consistent with Grade II diastolic dysfunction (pseudonormalization).  Elevated left atrial pressure.  2. Right ventricular systolic function is normal. The right ventricular  size is normal. Tricuspid regurgitation signal is inadequate for assessing  PA pressure.  3. The mitral valve is grossly normal. Mild mitral valve regurgitation.  No evidence of mitral stenosis.  4. The aortic valve is tricuspid. Aortic valve regurgitation is not  visualized. No aortic stenosis is present.  5. There is mild dilatation of the ascending aorta measuring 41 mm.  6. The inferior vena cava is normal in size with greater than 50%  respiratory variability, suggesting right atrial pressure of 3 mmHg.    Laboratory Data:  High Sensitivity Troponin:   Recent Labs  Lab 02/18/20 0641 02/18/20 0844  TROPONINIHS 70* 70*      Chemistry Recent Labs  Lab 02/18/20 0641  NA 140  K 4.0  CL 107  CO2 24  GLUCOSE 213*  BUN 28*  CREATININE 2.23*  CALCIUM 9.0  GFRNONAA 29*  GFRAA 33*  ANIONGAP 9    Recent Labs  Lab 02/18/20 0641  PROT 7.4  ALBUMIN 3.2*  AST 15  ALT 16  ALKPHOS 74  BILITOT 1.1   Hematology Recent Labs  Lab 02/18/20 0641  WBC 7.4  RBC 3.72*  HGB 11.9*  HCT 39.0  MCV 104.8*    MCH 32.0  MCHC 30.5  RDW 13.1  PLT 175   BNP Recent Labs  Lab 02/18/20 0641  BNP 1,163.2*    Radiology/Studies:  DG Chest Portable 1 View  Result Date: 02/18/2020 CLINICAL DATA:  Cough.  Shortness of breath. EXAM: PORTABLE CHEST 1 VIEW COMPARISON:  Chest x-ray 01/15/2020. FINDINGS: Cardiomegaly with pulmonary venous congestion. Bilateral pulmonary infiltrates/edema. Previously identified nodule noted over the left lung base not visualized due to pulmonary infiltrates/edema. No pleural effusion or pneumothorax. IMPRESSION: 1. Cardiomegaly with diffuse bilateral pulmonary infiltrates/edema. CHF could present this fashion. Mild lateral pneumonia cannot be excluded. 2. Previously identified left base pulmonary nodule not identified due to bilateral pulmonary infiltrates/edema. Continued follow-up exam suggested. Electronically Signed   By: Marcello Moores  Register   On: 02/18/2020 07:39   {  New York Heart Association (NYHA) Functional Class NYHA Class II  Assessment and Plan:   1. Acute on chronic diastolic heart failure with history of hypertrophic cardiomyopathy - No significant LVOT gradient by echo 12/2019.  - BNP 1100  - Acute hypoxia in setting of hypertensive urgency - On BIPAP. Diuresed ~1L after IV lasix 40mg  x1.  2.  Hypertensive urgency -On nitroglycerin drip -Resume home medications  3.  Chronic kidney disease stage IV -Seems creatinine at his baseline  4.  Acute hypoxic respiratory failure -Chest x-ray with pulmonary edema.  Cannot rule out pneumonia. -Covid negative -Medicine admit for further evaluation  Dr. Harrell Gave to give further recommendations.   For questions or updates, please contact [place] Please consult [email or web address] for contact info under     Jarrett Soho, PA  02/18/2020 1:03 PM

## 2020-02-18 NOTE — H&P (Signed)
History and Physical    ELIODORO GULLETT XBM:[id number] DOB: 1950-10-11 DOA: 02/18/2020  PCP: [name], MD Consultants:  [facility] - endocrinology; [facility] - cardiology Patient coming from:  Home - lives alone; NOK: Brother, [place], [phone]   Chief Complaint: SOB  HPI: Robert Chaney is a 69 y.o. male with medical history significant of hypertrophic cardiomyopathy with ectopy; HTN; HLD; glacuoma; DM; chronic diastolic CHF; and stage 3 CKD presenting with SOB/cough.  He reports that he has been having trouble breathing since about midnight.  He checked his O2 sats and they were 70 so he called 911.  His BP is often uncontrolled but improves if he takes his medication.  He reports medication compliance.  He did have CP prior to coming in but it is better now.  He feels like he is improved enough to come off BIPAP.    ED Course:  HTN crisis, started on BIPAP and NTG drip with improvement.  Review of Systems: As per HPI; otherwise review of systems reviewed and negative.   Ambulatory Status:  Ambulates without assistance  COVID Vaccine Status:   Complete  Past Medical History:  Diagnosis Date  . Altered mental status    a. 05/2017 - adm with blurred vision, somnolence in setting of AKI and high blood sugar.  . Anemia   . CKD (chronic kidney disease), stage III   . Diabetes mellitus   . Diastolic CHF, chronic (HCC)    A.  03/2009 Echo: EF 60-65%, Gr II diast dysfxn  . Elevated troponin    a. 2013 - troponin 1.4. b. 2019 - troponin 0.32; neg nuc 07/2017.  . Glaucoma   . Hyperlipidemia    HYPERCHOLESTEROLEMIA  . Hypertension    MARKED LEFT VENTRICULAR HYPERTROPHY BY PREVIOUS ECHOCARDIOGRAM--HE HAS HYPERDYNAMIC LEFT VENTRICULAR SYSTOLIC FUNCTION AND HAS IMPAIRED RELAXATION BY ECHO  . Hypertrophic cardiomyopathy ([place])   . NSVT (nonsustained ventricular tachycardia) ([place])   . Premature atrial contractions   . PVC's (premature ventricular contractions)   . SVT  (supraventricular tachycardia) (HCC)     Past Surgical History:  Procedure Laterality Date  . NO PAST SURGERIES      Social History   Socioeconomic History  . Marital status: Legally Separated    Spouse name: Not on file  . Number of children: Not on file  . Years of education: Not on file  . Highest education level: Not on file  Occupational History  . Occupation: retired    Fish farm manager: [employer]    Comment: At [employer]  . Smoking status: Never Smoker  . Smokeless tobacco: Never Used  Vaping Use  . Vaping Use: Never used  Substance and Sexual Activity  . Alcohol use: No  . Drug use: No  . Sexual activity: Never  Other Topics Concern  . Not on file  Social History Narrative   Originally from [place].    Social Determinants of Health   Financial Resource Strain:   . Difficulty of Paying Living Expenses: Not on file  Food Insecurity:   . Worried About Charity fundraiser in the Last Year: Not on file  . Ran Out of Food in the Last Year: Not on file  Transportation Needs:   . Lack of Transportation (Medical): Not on file  . Lack of Transportation (Non-Medical): Not on file  Physical Activity:   . Days of Exercise per Week: Not on file  . Minutes of Exercise per Session: Not on  file  Stress:   . Feeling of Stress : Not on file  Social Connections:   . Frequency of Communication with Friends and Family: Not on file  . Frequency of Social Gatherings with Friends and Family: Not on file  . Attends Religious Services: Not on file  . Active Member of Clubs or Organizations: Not on file  . Attends Archivist Meetings: Not on file  . Marital Status: Not on file  Intimate Partner Violence:   . Fear of Current or Ex-Partner: Not on file  . Emotionally Abused: Not on file  . Physically Abused: Not on file  . Sexually Abused: Not on file    Allergies  Allergen Reactions  . Aspirin Itching    Family History  Problem  Relation Age of Onset  . Hypertension Father   . Stroke Father   . Hypertension Mother   . Diabetes Brother   . Hypertension Brother   . Diabetes Brother     Prior to Admission medications   Medication Sig Start Date End Date Taking? Authorizing Provider  amLODipine (NORVASC) 5 MG tablet Take 1 tablet (5 mg total) by mouth at bedtime. 11/26/19   Skeet Latch, MD  atorvastatin (LIPITOR) 20 MG tablet Take 1 tablet (20 mg total) by mouth daily. 03/16/18   Dorothy Spark, MD  brimonidine (ALPHAGAN) 0.2 % ophthalmic solution Place 1 drop into the right eye 2 (two) times daily. 10/06/19   [provider]  carvedilol (COREG) 25 MG tablet Take 1 tablet (25 mg total) by mouth 2 (two) times daily. 10/06/19   Skeet Latch, MD  cholecalciferol (VITAMIN D3) 25 MCG (1000 UT) tablet Take 1,000 Units by mouth daily.    [provider]  cloNIDine (CATAPRES) 0.1 MG tablet Take 0.1 mg by mouth 3 (three) times daily.    [provider]  diclofenac Sodium (VOLTAREN) 1 % GEL Apply 4 g topically 4 (four) times daily as needed for pain. 02/07/20   [provider]  furosemide (LASIX) 20 MG tablet Take 2 tablets (40 mg total) by mouth daily. 01/16/20   Alma Friendly, MD  glucose blood (ONETOUCH VERIO) test strip Use as instructed to test blood sugar 3 times daily E11.65 03/12/19   Shamleffer, Melanie Crazier, MD  hydrALAZINE (APRESOLINE) 100 MG tablet Take 1 tablet (100 mg total) by mouth 2 (two) times daily. 09/14/19   Imogene Burn, PA-C  insulin aspart (NOVOLOG) 100 UNIT/ML injection Inject 0-6 Units into the skin 3 (three) times daily with meals. Sliding scale 09/02/19   [provider]  Insulin Glargine (TOUJEO MAX SOLOSTAR) 300 UNIT/ML SOPN Inject 18 Units into the skin at bedtime.    [provider]  Insulin Pen Needle (B-D UF III MINI PEN NEEDLES) 31G X 5 MM MISC Four times daily 06/30/18   Shamleffer, Melanie Crazier, MD  isosorbide mononitrate  (IMDUR) 60 MG 24 hr tablet Take 1 tablet (60 mg total) by mouth daily. 03/16/18   Dorothy Spark, MD  latanoprost (XALATAN) 0.005 % ophthalmic solution Place 1 drop into the left eye at bedtime. 10/06/19   [provider]  nitroGLYCERIN (NITROSTAT) 0.4 MG SL tablet Place 1 tablet (0.4 mg total) under the tongue every 5 (five) minutes as needed for chest pain. 03/29/16   Dorothy Spark, MD    Physical Exam: Vitals:   02/18/20 1514 02/18/20 1615 02/18/20 1630 02/18/20 1715  BP: (!) 180/137 (!) 165/122 (!) 166/128 (!) 173/82  Pulse:  73 66 72  Resp:  17 18 19   Temp:      TempSrc:      SpO2:  100% 99% 99%  Weight:      Height:         . General:  Appears calm and comfortable and is NAD . Eyes:  PERRL, EOMI, normal lids, iris . ENT:  grossly normal hearing, lips & tongue, mmm . Neck:  no LAD, masses or thyromegaly . Cardiovascular:  RRR, no m/r/g. No LE edema.  Marland Kitchen Respiratory:   CTA bilaterally with no wheezes/rales/rhonchi.  Normal respiratory effort. . Abdomen:  soft, NT, ND, NABS . Skin:  no rash or induration seen on limited exam . Musculoskeletal:  grossly normal tone BUE/BLE, good ROM, no bony abnormality . Psychiatric:  grossly normal mood and affect, speech fluent and appropriate, AOx3 . Neurologic:  CN 2-12 grossly intact, moves all extremities in coordinated fashion    Radiological Exams on Admission: DG Chest Portable 1 View  Result Date: 02/18/2020 CLINICAL DATA:  Cough.  Shortness of breath. EXAM: PORTABLE CHEST 1 VIEW COMPARISON:  Chest x-ray 01/15/2020. FINDINGS: Cardiomegaly with pulmonary venous congestion. Bilateral pulmonary infiltrates/edema. Previously identified nodule noted over the left lung base not visualized due to pulmonary infiltrates/edema. No pleural effusion or pneumothorax. IMPRESSION: 1. Cardiomegaly with diffuse bilateral pulmonary infiltrates/edema. CHF could present this fashion. Mild lateral pneumonia cannot be excluded. 2. Previously  identified left base pulmonary nodule not identified due to bilateral pulmonary infiltrates/edema. Continued follow-up exam suggested. Electronically Signed   By: Marcello Moores  Register   On: 02/18/2020 07:39    EKG: Independently reviewed.  NSR with rate 73; nonspecific ST changes with no evidence of acute ischemia   Labs on Admission: I have personally reviewed the available labs and imaging studies at the time of the admission.  Pertinent labs:   Glucose 213 BUN 28/Creatinine 2.23/GFR 29 - improved from 8/20, at/near baseline Albumin 3.2 BNP 1163.2; 628.4 on 7/20 HS troponin 70, 70 - stable WBC 7.4 Hgb 11.9 A1c 6.0 on 7/21 COVID negative   Assessment/Plan Principal Problem:   Hypertensive crisis Active Problems:   DM (diabetes mellitus) with complications (HCC)   Stage 4 chronic kidney disease (HCC)   Essential hypertension   Dyslipidemia   Acute exacerbation of CHF (congestive heart failure) (HCC)    Hypertensive crisis with acute on chronic diastolic CHF -Patient presenting with chest pain and SOB concerning for hypertensive crisis -He did have evidence of end organ failure, with acute CHF (prior echo on 01/11/20 with preserved EF; severe asymmetric LVH of the basal septum with prior h/o HOCM; and grade 2 diastolic CHF) -CXR consistent with mild pulmonary edema -Elevated BNP -Patient was placed on NTG drip and then BIPAP with subsequent significant improvement -Will admit, as per the Emergency HF Mortality Risk Grade.  The patient has:  severe pulmonary edema requiring new O2 therapy -He will need progressive care bed for now until no longer needing BIPAP and BP stabilizes -Continue NTG drip and so hold home Imdur -No ACE due to renal dysfunction -Was given Lasix 20 mg x 1 in ER and will repeat with 40 mg IV BID -Continue [place] O2 once transitioned off BIPAP -Stable kidney function at this time, will follow -Repeat EKG in AM -Mildly elevated HS troponin is likely related to  demand ischemia; doubt ACS based on symptoms  HTN -Continue home Norvasc, Coreg, Catapres, Hydralazine -Also on NTG drip  HLD -Continue Lipitor  DM -Last A1c was 6.0 -  good control -Continue Toujeo -Will cover with sensitive-scale SSI for now  Stage IV CKD -Appears to be stable at this time -Likely the result of long-stand and uncontrolled HTN -Recommend outpatient referral to nephrology  OSA -Continue CPAP   Note: This patient has been tested and is negative for the novel coronavirus COVID-19.   DVT prophylaxis: Lovenox  Code Status:  Full - confirmed with patient Family Communication: None present; I was unable to reach his brother by telephone at the time of admission but left a message Disposition Plan:  The patient is from: home  Anticipated d/c is to: home, possibly with [facility] services  Anticipated d/c date will depend on clinical response to treatment, likely 2-4 days  Patient is currently: acutely ill Consults called: Cardiology; [facility] team Admission status: Admit - It is my clinical opinion that admission to INPATIENT is reasonable and necessary because this patient will require at least 2 midnights in the hospital to treat this condition based on the medical complexity of the problems presented.  Given the aforementioned information, the predictability of an adverse outcome is felt to be significant.    Karmen Bongo MD [facility]   How to contact the [facility] Attending or Consulting provider [place] or covering provider during after hours [place], for this patient?  1. Check the care team in [facility] and look for a) attending/consulting TR[facility] provider listed and b) the [facility] team listed 2. Log into [email or web address] and use [place]'s universal password to access. If you do not have the password, please contact the hospital operator. 3. Locate the [facility] provider you are looking for under [facility] and page to a number that you can be directly reached. 4. If you  still have difficulty reaching the provider, please page the [facility] (Director on Call) for the Hospitalists listed on amion for assistance.   02/18/2020, 5:52 PM

## 2020-02-19 ENCOUNTER — Inpatient Hospital Stay (HOSPITAL_COMMUNITY): Payer: PPO

## 2020-02-19 DIAGNOSIS — I5033 Acute on chronic diastolic (congestive) heart failure: Secondary | ICD-10-CM

## 2020-02-19 DIAGNOSIS — I169 Hypertensive crisis, unspecified: Secondary | ICD-10-CM

## 2020-02-19 DIAGNOSIS — N184 Chronic kidney disease, stage 4 (severe): Secondary | ICD-10-CM

## 2020-02-19 LAB — ECHOCARDIOGRAM LIMITED
Height: 68 in
Weight: 2719.59 oz

## 2020-02-19 LAB — BASIC METABOLIC PANEL
Anion gap: 5 (ref 5–15)
BUN: 29 mg/dL — ABNORMAL HIGH (ref 8–23)
CO2: 27 mmol/L (ref 22–32)
Calcium: 8.7 mg/dL — ABNORMAL LOW (ref 8.9–10.3)
Chloride: 107 mmol/L (ref 98–111)
Creatinine, Ser: 2.16 mg/dL — ABNORMAL HIGH (ref 0.61–1.24)
GFR calc Af Amer: 34 mL/min — ABNORMAL LOW (ref >60)
GFR calc non Af Amer: 30 mL/min — ABNORMAL LOW (ref >60)
Glucose, Bld: 121 mg/dL — ABNORMAL HIGH (ref 70–99)
Potassium: 4.1 mmol/L (ref 3.5–5.1)
Sodium: 139 mmol/L (ref 135–145)

## 2020-02-19 LAB — CBC
HCT: 32.5 % — ABNORMAL LOW (ref 39.0–52.0)
Hemoglobin: 10.1 g/dL — ABNORMAL LOW (ref 13.0–17.0)
MCH: 31.6 pg (ref 26.0–34.0)
MCHC: 31.1 g/dL (ref 30.0–36.0)
MCV: 101.6 fL — ABNORMAL HIGH (ref 80.0–100.0)
Platelets: 167 10*3/uL (ref 150–400)
RBC: 3.2 MIL/uL — ABNORMAL LOW (ref 4.22–5.81)
RDW: 13.1 % (ref 11.5–15.5)
WBC: 4.4 10*3/uL (ref 4.0–10.5)
nRBC: 0 % (ref 0.0–0.2)

## 2020-02-19 LAB — GLUCOSE, CAPILLARY
Glucose-Capillary: 138 mg/dL — ABNORMAL HIGH (ref 70–99)
Glucose-Capillary: 108 mg/dL — ABNORMAL HIGH (ref 70–99)
Glucose-Capillary: 151 mg/dL — ABNORMAL HIGH (ref 70–99)
Glucose-Capillary: 91 mg/dL (ref 70–99)
Glucose-Capillary: 115 mg/dL — ABNORMAL HIGH (ref 70–99)

## 2020-02-19 MED ORDER — CARVEDILOL 25 MG PO TABS
37.5000 mg | ORAL_TABLET | Freq: Two times a day (BID) | ORAL | Status: DC
  Administered 2020-02-19 – 2020-02-20 (×2): 37.5 mg via ORAL
  Filled 2020-02-19 (×2): qty 1

## 2020-02-19 NOTE — Progress Notes (Signed)
Progress Note  Patient Name: Robert Chaney Date of Encounter: 02/19/2020  [facility] Cardiologist: Ena Dawley, MD   Subjective   Feeling much better, despite only meager (documented) diuresis. BP much better, still with SBP > 140 on 50 mcg NTG IV. He has a systolic murmur today, reportedly not yesterday.  Inpatient Medications    Scheduled Meds:  amLODipine  5 mg Oral Daily   atorvastatin  20 mg Oral Daily   brimonidine  1 drop Right Eye BID   carvedilol  25 mg Oral BID WC   cloNIDine  0.1 mg Oral TID   docusate sodium  100 mg Oral BID   enoxaparin (LOVENOX) injection  30 mg Subcutaneous Q24H   hydrALAZINE  100 mg Oral BID   insulin aspart  0-5 Units Subcutaneous QHS   insulin aspart  0-9 Units Subcutaneous TID WC   insulin glargine  18 Units Subcutaneous QHS   latanoprost  1 drop Left Eye QHS   sodium chloride flush  3 mL Intravenous Q12H   Continuous Infusions:  nitroGLYCERIN 33.333 mcg/min (02/19/20 0605)   PRN Meds: acetaminophen **OR** acetaminophen, morphine injection, ondansetron **OR** ondansetron (ZOFRAN) IV   Vital Signs    Vitals:   02/19/20 0533 02/19/20 0625 02/19/20 0732 02/19/20 0832  BP:  (!) 149/79 (!) 155/85   Pulse: (!) 58  68   Resp: 14 12 18    Temp:   97.8 F (36.6 C)   TempSrc:   Axillary   SpO2:   98% 98%  Weight:      Height:        Intake/Output Summary (Last 24 hours) at 02/19/2020 0916 Last data filed at 02/19/2020 0643 Gross per 24 hour  Intake 502.88 ml  Output 900 ml  Net -397.12 ml   Last 3 Weights 02/19/2020 02/18/2020 02/11/2020  Weight (lbs) 169 lb 15.6 oz 180 lb 185 lb 6.4 oz  Weight (kg) 77.1 kg 81.647 kg 84.097 kg      Telemetry    NSR, occ PVCs - Personally Reviewed  ECG    NSR, voltage for LVH - Personally Reviewed  Physical Exam  Appears comfortable GEN: No acute distress.   Neck: No JVD Cardiac: RRR, no diastolic murmurs, rubs, or gallops. 2/6 systolic murmur throughout precordium,  disappears with bilateral handgrip, but no particular intensification with Valsalva maneuver Respiratory: Clear to auscultation bilaterally. GI: Soft, nontender, non-distended  MS: No edema; No deformity. Neuro:  Nonfocal  Psych: Normal affect   Labs    High Sensitivity Troponin:   Recent Labs  Lab 02/18/20 0641 02/18/20 0844  TROPONINIHS 70* 70*      Chemistry Recent Labs  Lab 02/18/20 0641 02/19/20 0221  NA 140 139  K 4.0 4.1  CL 107 107  CO2 24 27  GLUCOSE 213* 121*  BUN 28* 29*  CREATININE 2.23* 2.16*  CALCIUM 9.0 8.7*  PROT 7.4  --   ALBUMIN 3.2*  --   AST 15  --   ALT 16  --   ALKPHOS 74  --   BILITOT 1.1  --   GFRNONAA 29* 30*  GFRAA 33* 34*  ANIONGAP 9 5     Hematology Recent Labs  Lab 02/18/20 0641 02/19/20 0221  WBC 7.4 4.4  RBC 3.72* 3.20*  HGB 11.9* 10.1*  HCT 39.0 32.5*  MCV 104.8* 101.6*  MCH 32.0 31.6  MCHC 30.5 31.1  RDW 13.1 13.1  PLT 175 167    BNP Recent Labs  Lab  02/18/20 0641  BNP 1,163.2*     DDimer No results for input(s): DDIMER in the last 168 hours.   Radiology    DG Chest Portable 1 View  Result Date: 02/18/2020 CLINICAL DATA:  Cough.  Shortness of breath. EXAM: PORTABLE CHEST 1 VIEW COMPARISON:  Chest x-ray 01/15/2020. FINDINGS: Cardiomegaly with pulmonary venous congestion. Bilateral pulmonary infiltrates/edema. Previously identified nodule noted over the left lung base not visualized due to pulmonary infiltrates/edema. No pleural effusion or pneumothorax. IMPRESSION: 1. Cardiomegaly with diffuse bilateral pulmonary infiltrates/edema. CHF could present this fashion. Mild lateral pneumonia cannot be excluded. 2. Previously identified left base pulmonary nodule not identified due to bilateral pulmonary infiltrates/edema. Continued follow-up exam suggested. Electronically Signed   By: Marcello Moores  Register   On: 02/18/2020 07:39    Cardiac Studies   Relevant CV Studies:  Cardiac MRI 10/2016 IMPRESSION: 1. Normal left  ventricular size with severe left ventricular hypertrophy with basal septal thickness of 21 mm and normal systolic function (LVEF = 62%). There are no regional wall motion abnormalities.  There are diffuse patchy late gadolinium enhancement in the basal anterior, anteroseptal, inferior, inferolateral, mid anterior, inferior, inferolateral and apical inferior walls.  2. Normal right ventricular size, thickness and systolic volume.  3. Mild mitral and tricuspid regurgitation.  Collectively, these findings are consistent with hypertrophic cardiomyopathy with high risk feature - presence of LGE in the 8 myocardial segments.  Stress test 07/2017  Electrically nondiagnositic due to baseline LVH with strain  Probable normal perfusion and soft tissue attenuation (diaphragm) No ischemia  LVEF 585  Low risk scan    Monitor 04/2019 10 day event monitor, no symptoms. Predominant rhythm was sinus rhythm with average rate 70 BPM. 18 Supraventricular Tachycardia runs, the longest lasting 30 seconds with HR 156 BPM.  Rare PVCs (< 1%), 3 couplets and 1 triplet. No ventricular arrhythmias.   Echo 12/2019 1. Severe asymmetric LVH of the basal septum up to 1.8 cm. Prior history  of HOCM. No chordal or mitral valve SAM. No significant LVOT gradient on  this study (HR 63 bpm). No provocative maneuvers performed. Left  ventricular ejection fraction, by  estimation, is 60 to 65%. The left ventricle has normal function. The left  ventricle has no regional wall motion abnormalities. There is severe  asymmetric left ventricular hypertrophy of the basal-septal segment. Left  ventricular diastolic parameters are  consistent with Grade II diastolic dysfunction (pseudonormalization).  Elevated left atrial pressure.  2. Right ventricular systolic function is normal. The right ventricular  size is normal. Tricuspid regurgitation signal is inadequate for assessing  PA pressure.  3. The  mitral valve is grossly normal. Mild mitral valve regurgitation.  No evidence of mitral stenosis.  4. The aortic valve is tricuspid. Aortic valve regurgitation is not  visualized. No aortic stenosis is present.  5. There is mild dilatation of the ascending aorta measuring 41 mm.  6. The inferior vena cava is normal in size with greater than 50%  respiratory variability, suggesting right atrial pressure of 3 mmHg.   Patient Profile     69 y.o. male with HCM and asymmetrical septal hypertrophy, diastolic HF, severe HTN, CKD IV, admitted with flash pulmonary edema and acute hypoxic respiratory failure  Assessment & Plan    1. Pulmonary edema: resolved. Etiology uncertain. Reports compliance with meds. No arrhythmia detected. Consider dynamic LVOT obstruction and/or renal artery stenosis, statistically less likely to be pheochromocytoma. No overt volume overload. No need for more diuretics. 2. H(O)CM: exam today  has a murmur c/w dynamic LV outflow obstruction (while on IV NTG). Repeat limited echo w Valsalva. If gradient confirmed, would benefit from fewer vasodilators (maybe switch carvedilol to metoprolol). Need to get off IV NTG. For now increased carvedilol dose. Cause of diastolic HF, acute on chronic. 3. HTN: severe, requiring multiple agents for control. Evaluate for renal artery stenosis (discussed w vascular US tech - will not be possible until Monday, may need to do as an outpatient). 4. CKD 4: improving creatinine since admission and better than earlier this month.     For questions or updates, please contact [place] Please consult [email or web address] for contact info under        Signed, Sanda Klein, MD  02/19/2020, 9:16 AM

## 2020-02-19 NOTE — Progress Notes (Signed)
RT NOTES: Pt removed from bipap and placed on 4lpm nasal cannula. Sats 98%. Will continue to monitor.

## 2020-02-19 NOTE — Progress Notes (Signed)
PROGRESS NOTE    Robert Chaney  IWL:[id number] DOB: [birth year] DOA: 02/18/2020 PCP: [name], MD     Brief Narrative:  Robert Chaney is a 69 y.o. male with medical history significant of hypertrophic cardiomyopathy with ectopy; HTN; HLD; glacuoma; DM; chronic diastolic CHF; and stage 3 CKD presenting with SOB/cough.  He reports that he has been having trouble breathing since about midnight.  He checked his O2 sats and they were 70 so he called 911.  His BP is often uncontrolled but improves if he takes his medication.  He reports medication compliance.    In the emergency department he was found to be in hypertensive crisis, was placed on BiPAP and started on nitro drip.  Cardiology was consulted.  New events last 24 hours / Subjective: Patient was weaned off BiPAP, currently on nasal cannula O2.  Remains on nitro drip.  States that his breathing has improved.  Denies any chest pain.  Assessment & Plan:   Principal Problem:   Hypertensive crisis Active Problems:   DM (diabetes mellitus) with complications (HCC)   Stage 4 chronic kidney disease ([place])   Essential hypertension   Dyslipidemia   Acute exacerbation of CHF (congestive heart failure) (HCC)   Hypertensive crisis with pulmonary edema -Appreciate cardiology -Chest x-ray with cardiomegaly, diffuse bilateral pulmonary infiltrates/edema -Weaned off BiPAP -Continue nitro drip, wean  -Continue home medications including Norvasc, Coreg, Catapres, hydralazine.  Dose adjusted by cardiology today -Echocardiogram pending  Acute hypoxemic respiratory failure -Weaned off BiPAP -Continue to wean to room air as able  Hyperlipidemia -Continue Lipitor  Type 2 diabetes, well controlled -Continue lantus and sliding scale insulin  CKD stage IV -Baseline creatinine 2.5 -Stable  -Recommend outpatient nephrology follow-up  OSA -Continue CPAP nightly  Demand ischemia -Elevated troponin trend has been flat, no complaints of  chest pain   DVT prophylaxis:  enoxaparin (LOVENOX) injection 30 mg Start: 02/18/20 1745  Code Status: Full code Family Communication: No family at bedside Disposition Plan:   Status is: Inpatient  Remains inpatient appropriate because:IV treatments appropriate due to intensity of illness or inability to take PO and Inpatient level of care appropriate due to severity of illness   Dispo: The patient is from: Home              Anticipated d/c is to: Home              Anticipated d/c date is: 1 day              Patient currently is not medically stable to d/c.  Remains on nitroglycerin drip currently    Consultants:   Cardiology  Procedures:   None  Antimicrobials:  Anti-infectives (From admission, onward)   None        Objective: Vitals:   02/19/20 0732 02/19/20 0832 02/19/20 0951 02/19/20 1021  BP: (!) 155/85  (!) 142/69 (!) 163/73  Pulse: 68  73   Resp: 18     Temp: 97.8 F (36.6 C)     TempSrc: Axillary     SpO2: 98% 98%    Weight:      Height:        Intake/Output Summary (Last 24 hours) at 02/19/2020 1138 Last data filed at 02/19/2020 0643 Gross per 24 hour  Intake 502.88 ml  Output 900 ml  Net -397.12 ml   Filed Weights   02/18/20 0627 02/19/20 0420  Weight: 81.6 kg 77.1 kg    Examination:  General exam: Appears  calm and comfortable  Respiratory system: Clear to auscultation. Respiratory effort normal. No respiratory distress. No conversational dyspnea.  Cardiovascular system: S1 & S2 heard, RRR. No murmurs. No pedal edema. Gastrointestinal system: Abdomen is nondistended, soft and nontender. Normal bowel sounds heard. Central nervous system: Alert and oriented. No focal neurological deficits. Speech clear.  Extremities: Symmetric in appearance  Skin: No rashes, lesions or ulcers on exposed skin  Psychiatry: Judgement and insight appear normal. Mood & affect appropriate.   Data Reviewed: I have personally reviewed following labs and imaging  studies  CBC: Recent Labs  Lab 02/18/20 0641 02/19/20 0221  WBC 7.4 4.4  NEUTROABS 6.0  --   HGB 11.9* 10.1*  HCT 39.0 32.5*  MCV 104.8* 101.6*  PLT 175 355   Basic Metabolic Panel: Recent Labs  Lab 02/18/20 0641 02/19/20 0221  NA 140 139  K 4.0 4.1  CL 107 107  CO2 24 27  GLUCOSE 213* 121*  BUN 28* 29*  CREATININE 2.23* 2.16*  CALCIUM 9.0 8.7*   GFR: Estimated Creatinine Clearance: 30.3 mL/min (A) (by C-G formula based on SCr of 2.16 mg/dL (H)). Liver Function Tests: Recent Labs  Lab 02/18/20 0641  AST 15  ALT 16  ALKPHOS 74  BILITOT 1.1  PROT 7.4  ALBUMIN 3.2*   No results for input(s): LIPASE, AMYLASE in the last 168 hours. No results for input(s): AMMONIA in the last 168 hours. Coagulation Profile: No results for input(s): INR, PROTIME in the last 168 hours. Cardiac Enzymes: No results for input(s): CKTOTAL, CKMB, CKMBINDEX, TROPONINI in the last 168 hours. BNP (last 3 results) No results for input(s): PROBNP in the last 8760 hours. HbA1C: No results for input(s): HGBA1C in the last 72 hours. CBG: Recent Labs  Lab 02/18/20 1738 02/18/20 2252 02/19/20 0733  GLUCAP 114* 138* 108*   Lipid Profile: No results for input(s): CHOL, HDL, LDLCALC, TRIG, CHOLHDL, LDLDIRECT in the last 72 hours. Thyroid Function Tests: No results for input(s): TSH, T4TOTAL, FREET4, T3FREE, THYROIDAB in the last 72 hours. Anemia Panel: No results for input(s): VITAMINB12, FOLATE, FERRITIN, TIBC, IRON, RETICCTPCT in the last 72 hours. Sepsis Labs: No results for input(s): PROCALCITON, LATICACIDVEN in the last 168 hours.  Recent Results (from the past 240 hour(s))  SARS Coronavirus 2 by RT PCR (hospital order, performed in [facility] hospital lab) Nasopharyngeal Nasopharyngeal Swab     Status: None   Collection Time: 02/18/20  6:45 AM   Specimen: Nasopharyngeal Swab  Result Value Ref Range Status   SARS Coronavirus 2 NEGATIVE NEGATIVE Final    Comment:  (NOTE) SARS-CoV-2 target nucleic acids are NOT DETECTED.  The SARS-CoV-2 RNA is generally detectable in upper and lower respiratory specimens during the acute phase of infection. The lowest concentration of SARS-CoV-2 viral copies this assay can detect is 250 copies / mL. A negative result does not preclude SARS-CoV-2 infection and should not be used as the sole basis for treatment or other patient management decisions.  A negative result may occur with improper specimen collection / handling, submission of specimen other than nasopharyngeal swab, presence of viral mutation(s) within the areas targeted by this assay, and inadequate number of viral copies (<250 copies / mL). A negative result must be combined with clinical observations, patient history, and epidemiological information.  Fact Sheet for Patients:   StrictlyIdeas.no  Fact Sheet for Healthcare Providers: BankingDealers.co.za  This test is not yet approved or  cleared by the [place] FDA and has been authorized for  detection and/or diagnosis of SARS-CoV-2 by FDA under an Emergency Use Authorization (EUA).  This EUA will remain in effect (meaning this test can be used) for the duration of the COVID-19 declaration under Section 564(b)(1) of the Act, 21 U.S.C. section 360bbb-3(b)(1), unless the authorization is terminated or revoked sooner.  Performed at [facility], [place] [address], [place], [place] [address]       Radiology Studies: DG Chest Portable 1 View  Result Date: 02/18/2020 CLINICAL DATA:  Cough.  Shortness of breath. EXAM: PORTABLE CHEST 1 VIEW COMPARISON:  Chest x-ray 01/15/2020. FINDINGS: Cardiomegaly with pulmonary venous congestion. Bilateral pulmonary infiltrates/edema. Previously identified nodule noted over the left lung base not visualized due to pulmonary infiltrates/edema. No pleural effusion or pneumothorax. IMPRESSION: 1. Cardiomegaly with  diffuse bilateral pulmonary infiltrates/edema. CHF could present this fashion. Mild lateral pneumonia cannot be excluded. 2. Previously identified left base pulmonary nodule not identified due to bilateral pulmonary infiltrates/edema. Continued follow-up exam suggested. Electronically Signed   By: [place]   On: 02/18/2020 07:39      Scheduled Meds: . amLODipine  5 mg Oral Daily  . atorvastatin  20 mg Oral Daily  . brimonidine  1 drop Right Eye BID  . carvedilol  37.5 mg Oral BID WC  . cloNIDine  0.1 mg Oral TID  . docusate sodium  100 mg Oral BID  . enoxaparin (LOVENOX) injection  30 mg Subcutaneous Q24H  . hydrALAZINE  100 mg Oral BID  . insulin aspart  0-5 Units Subcutaneous QHS  . insulin aspart  0-9 Units Subcutaneous TID WC  . insulin glargine  18 Units Subcutaneous QHS  . latanoprost  1 drop Left Eye QHS  . sodium chloride flush  3 mL Intravenous Q12H   Continuous Infusions: . nitroGLYCERIN 50 mcg/min (02/19/20 1020)     LOS: 1 day      Time spent: 35 minutes   Dessa Phi, DO [facility] 02/19/2020, 11:38 AM   Available via Epic secure chat 7am-7pm After these hours, please refer to coverage provider listed on [email or web address]

## 2020-02-19 NOTE — Progress Notes (Signed)
  Echocardiogram 2D Echocardiogram has been performed.  Johny Chess 02/19/2020, 11:51 AM

## 2020-02-20 ENCOUNTER — Other Ambulatory Visit: Payer: Self-pay | Admitting: Medical

## 2020-02-20 ENCOUNTER — Encounter (HOSPITAL_COMMUNITY): Payer: PPO

## 2020-02-20 DIAGNOSIS — I10 Essential (primary) hypertension: Secondary | ICD-10-CM

## 2020-02-20 LAB — BASIC METABOLIC PANEL
Anion gap: 4 — ABNORMAL LOW (ref 5–15)
BUN: 37 mg/dL — ABNORMAL HIGH (ref 8–23)
CO2: 27 mmol/L (ref 22–32)
Calcium: 8.3 mg/dL — ABNORMAL LOW (ref 8.9–10.3)
Chloride: 106 mmol/L (ref 98–111)
Creatinine, Ser: 2.28 mg/dL — ABNORMAL HIGH (ref 0.61–1.24)
GFR calc Af Amer: 32 mL/min — ABNORMAL LOW (ref >60)
GFR calc non Af Amer: 28 mL/min — ABNORMAL LOW (ref >60)
Glucose, Bld: 104 mg/dL — ABNORMAL HIGH (ref 70–99)
Potassium: 3.9 mmol/L (ref 3.5–5.1)
Sodium: 137 mmol/L (ref 135–145)

## 2020-02-20 LAB — GLUCOSE, CAPILLARY
Glucose-Capillary: 56 mg/dL — ABNORMAL LOW (ref 70–99)
Glucose-Capillary: 98 mg/dL (ref 70–99)
Glucose-Capillary: 164 mg/dL — ABNORMAL HIGH (ref 70–99)
Glucose-Capillary: 62 mg/dL — ABNORMAL LOW (ref 70–99)
Glucose-Capillary: 178 mg/dL — ABNORMAL HIGH (ref 70–99)
Glucose-Capillary: 156 mg/dL — ABNORMAL HIGH (ref 70–99)

## 2020-02-20 MED ORDER — FUROSEMIDE 40 MG PO TABS
40.0000 mg | ORAL_TABLET | Freq: Every day | ORAL | Status: DC
  Administered 2020-02-20 – 2020-02-21 (×2): 40 mg via ORAL
  Filled 2020-02-20 (×3): qty 1

## 2020-02-20 MED ORDER — CARVEDILOL 25 MG PO TABS
50.0000 mg | ORAL_TABLET | Freq: Two times a day (BID) | ORAL | Status: DC
  Administered 2020-02-20 – 2020-02-21 (×2): 50 mg via ORAL
  Filled 2020-02-20 (×2): qty 2

## 2020-02-20 NOTE — Progress Notes (Signed)
PROGRESS NOTE    Robert Chaney  OEU:[id number] DOB: [birth year] DOA: 02/18/2020 PCP: [name], MD     Brief Narrative:  Robert Chaney is a 69 y.o. male with medical history significant of hypertrophic cardiomyopathy with ectopy; HTN; HLD; glacuoma; DM; chronic diastolic CHF; and stage 3 CKD presenting with SOB/cough.  He reports that he has been having trouble breathing since about midnight.  He checked his O2 sats and they were 70 so he called 911.  His BP is often uncontrolled but improves if he takes his medication.  He reports medication compliance.    In the emergency department he was found to be in hypertensive crisis, was placed on BiPAP and started on nitro drip.  Cardiology was consulted.  New events last 24 hours / Subjective: No complaints, states that his breathing has improved.  Denies any chest pain.  Remains on nitro drip  Assessment & Plan:   Principal Problem:   Hypertensive crisis Active Problems:   DM (diabetes mellitus) with complications (HCC)   Stage 4 chronic kidney disease ([place])   Essential hypertension   Dyslipidemia   Acute exacerbation of CHF (congestive heart failure) (HCC)   Hypertensive crisis with pulmonary edema -Appreciate cardiology -Chest x-ray with cardiomegaly, diffuse bilateral pulmonary infiltrates/edema -Weaned off BiPAP -Continue nitro drip, wean  -Continue home medications including Norvasc, Coreg, Catapres, hydralazine, lasix .  Dose adjusted by cardiology today  Acute hypoxemic respiratory failure -Weaned off BiPAP -Continue to wean to room air as able  Hyperlipidemia -Continue Lipitor  Type 2 diabetes, well controlled -Continue lantus and sliding scale insulin  CKD stage IV -Baseline creatinine 2.5 -Stable  -Recommend outpatient nephrology follow-up  OSA -Continue CPAP nightly  Demand ischemia -Elevated troponin trend has been flat, no complaints of chest pain   DVT prophylaxis:  enoxaparin (LOVENOX) injection 30  mg Start: 02/18/20 1745  Code Status: Full code Family Communication: No family at bedside Disposition Plan:   Status is: Inpatient  Remains inpatient appropriate because:IV treatments appropriate due to intensity of illness or inability to take PO and Inpatient level of care appropriate due to severity of illness   Dispo: The patient is from: Home              Anticipated d/c is to: Home              Anticipated d/c date is: 1 day              Patient currently is not medically stable to d/c.  Remains on nitroglycerin drip currently as well as oxygen    Consultants:   Cardiology  Procedures:   None  Antimicrobials:  Anti-infectives (From admission, onward)   None       Objective: Vitals:   02/19/20 2300 02/20/20 0445 02/20/20 0800 02/20/20 0801  BP: 130/60 (!) 183/86 (!) 170/80   Pulse: 70 62  65  Resp:  11    Temp: 98.7 F (37.1 C) (!) 97.3 F (36.3 C)    TempSrc: Oral Oral    SpO2: 98%     Weight:  79.6 kg    Height:        Intake/Output Summary (Last 24 hours) at 02/20/2020 1103 Last data filed at 02/20/2020 0856 Gross per 24 hour  Intake 400.9 ml  Output 1300 ml  Net -899.1 ml   Filed Weights   02/18/20 0627 02/19/20 0420 02/20/20 0445  Weight: 81.6 kg 77.1 kg 79.6 kg    Examination: General exam:  Appears calm and comfortable  Respiratory system: Clear to auscultation. Respiratory effort normal.  No conversational dyspnea.  Nasal cannula O2 Cardiovascular system: S1 & S2 heard, RRR. No pedal edema. Gastrointestinal system: Abdomen is nondistended, soft and nontender. Normal bowel sounds heard. Central nervous system: Alert and oriented. Non focal exam. Speech clear  Extremities: Symmetric in appearance bilaterally  Skin: No rashes, lesions or ulcers on exposed skin  Psychiatry: Judgement and insight appear stable. Mood & affect appropriate.    Data Reviewed: I have personally reviewed following labs and imaging studies  CBC: Recent Labs    Lab 15-Mar-2020 0641 02/19/20 0221  WBC 7.4 4.4  NEUTROABS 6.0  --   HGB 11.9* 10.1*  HCT 39.0 32.5*  MCV 104.8* 101.6*  PLT 175 201   Basic Metabolic Panel: Recent Labs  Lab 15-Mar-2020 0641 02/19/20 0221 02/20/20 0331  NA 140 139 137  K 4.0 4.1 3.9  CL 107 107 106  CO2 24 27 27   GLUCOSE 213* 121* 104*  BUN 28* 29* 37*  CREATININE 2.23* 2.16* 2.28*  CALCIUM 9.0 8.7* 8.3*   GFR: Estimated Creatinine Clearance: 28.8 mL/min (A) (by C-G formula based on SCr of 2.28 mg/dL (H)). Liver Function Tests: Recent Labs  Lab Mar 15, 2020 0641  AST 15  ALT 16  ALKPHOS 74  BILITOT 1.1  PROT 7.4  ALBUMIN 3.2*   No results for input(s): LIPASE, AMYLASE in the last 168 hours. No results for input(s): AMMONIA in the last 168 hours. Coagulation Profile: No results for input(s): INR, PROTIME in the last 168 hours. Cardiac Enzymes: No results for input(s): CKTOTAL, CKMB, CKMBINDEX, TROPONINI in the last 168 hours. BNP (last 3 results) No results for input(s): PROBNP in the last 8760 hours. HbA1C: No results for input(s): HGBA1C in the last 72 hours. CBG: Recent Labs  Lab 02/19/20 1216 02/19/20 1656 02/19/20 2032 02/20/20 0733 02/20/20 0825  GLUCAP 151* 91 115* 56* 98   Lipid Profile: No results for input(s): CHOL, HDL, LDLCALC, TRIG, CHOLHDL, LDLDIRECT in the last 72 hours. Thyroid Function Tests: No results for input(s): TSH, T4TOTAL, FREET4, T3FREE, THYROIDAB in the last 72 hours. Anemia Panel: No results for input(s): VITAMINB12, FOLATE, FERRITIN, TIBC, IRON, RETICCTPCT in the last 72 hours. Sepsis Labs: No results for input(s): PROCALCITON, LATICACIDVEN in the last 168 hours.  Recent Results (from the past 240 hour(s))  SARS Coronavirus 2 by RT PCR (hospital order, performed in [facility] hospital lab) Nasopharyngeal Nasopharyngeal Swab     Status: None   Collection Time: 15-Mar-2020  6:45 AM   Specimen: Nasopharyngeal Swab  Result Value Ref Range Status   SARS Coronavirus  2 NEGATIVE NEGATIVE Final    Comment: (NOTE) SARS-CoV-2 target nucleic acids are NOT DETECTED.  The SARS-CoV-2 RNA is generally detectable in upper and lower respiratory specimens during the acute phase of infection. The lowest concentration of SARS-CoV-2 viral copies this assay can detect is 250 copies / mL. A negative result does not preclude SARS-CoV-2 infection and should not be used as the sole basis for treatment or other patient management decisions.  A negative result may occur with improper specimen collection / handling, submission of specimen other than nasopharyngeal swab, presence of viral mutation(s) within the areas targeted by this assay, and inadequate number of viral copies (<250 copies / mL). A negative result must be combined with clinical observations, patient history, and epidemiological information.  Fact Sheet for Patients:   StrictlyIdeas.no  Fact Sheet for Healthcare Providers: BankingDealers.co.za  This  test is not yet approved or  cleared by the [place] and has been authorized for detection and/or diagnosis of SARS-CoV-2 by FDA under an Emergency Use Authorization (EUA).  This EUA will remain in effect (meaning this test can be used) for the duration of the COVID-19 declaration under Section 564(b)(1) of the Act, 21 U.S.C. section 360bbb-3(b)(1), unless the authorization is terminated or revoked sooner.  Performed at [facility], [place] [address] [place], [place] [address]       Radiology Studies: ECHOCARDIOGRAM LIMITED  Result Date: 02/19/2020    ECHOCARDIOGRAM LIMITED REPORT   Patient Name:   WINFIELD CABA Date of Exam: 02/19/2020 Medical Rec #:  [id number]     Height:       68.0 in Accession #:    [id number]    Weight:       170.0 lb Date of Birth:  August 04, 1950      BSA:          1.907 m Patient Age:    89 years      BP: Patient Gender: M             HR: Exam Location:  Referring Phys: [id number]  MIHAI CROITORU  FINDINGS  Left Ventricle: Limited study with Valsalva does not show any significant LVOT gradient to suggest obstruction. Rozann Lesches MD Electronically signed by Rozann Lesches MD Signature Date/Time: 02/19/2020/3:42:48 PM    Final       Scheduled Meds: . amLODipine  5 mg Oral Daily  . atorvastatin  20 mg Oral Daily  . brimonidine  1 drop Right Eye BID  . carvedilol  50 mg Oral BID WC  . cloNIDine  0.1 mg Oral TID  . docusate sodium  100 mg Oral BID  . enoxaparin (LOVENOX) injection  30 mg Subcutaneous Q24H  . furosemide  40 mg Oral Daily  . hydrALAZINE  100 mg Oral BID  . insulin aspart  0-5 Units Subcutaneous QHS  . insulin aspart  0-9 Units Subcutaneous TID WC  . insulin glargine  18 Units Subcutaneous QHS  . latanoprost  1 drop Left Eye QHS  . sodium chloride flush  3 mL Intravenous Q12H   Continuous Infusions:    LOS: 2 days      Time spent: 75minutes   Dessa Phi, DO [facility] 02/20/2020, 11:03 AM   Available via Epic secure chat 7am-7pm After these hours, please refer to coverage provider listed on [email or web address]

## 2020-02-20 NOTE — Progress Notes (Signed)
Progress Note  Patient Name: Robert Chaney Date of Encounter: 02/20/2020  [facility] Cardiologist: Ena Dawley, MD   Subjective   Feels much better.  Inpatient Medications    Scheduled Meds: . amLODipine  5 mg Oral Daily  . atorvastatin  20 mg Oral Daily  . brimonidine  1 drop Right Eye BID  . carvedilol  37.5 mg Oral BID WC  . cloNIDine  0.1 mg Oral TID  . docusate sodium  100 mg Oral BID  . enoxaparin (LOVENOX) injection  30 mg Subcutaneous Q24H  . hydrALAZINE  100 mg Oral BID  . insulin aspart  0-5 Units Subcutaneous QHS  . insulin aspart  0-9 Units Subcutaneous TID WC  . insulin glargine  18 Units Subcutaneous QHS  . latanoprost  1 drop Left Eye QHS  . sodium chloride flush  3 mL Intravenous Q12H   Continuous Infusions: . nitroGLYCERIN 20 mcg/min (02/20/20 0805)   PRN Meds: acetaminophen **OR** acetaminophen, morphine injection, ondansetron **OR** ondansetron (ZOFRAN) IV   Vital Signs    Vitals:   02/19/20 2300 02/20/20 0445 02/20/20 0800 02/20/20 0801  BP: 130/60 (!) 183/86 (!) 170/80   Pulse: 70 62  65  Resp:  11    Temp: 98.7 F (37.1 C) (!) 97.3 F (36.3 C)    TempSrc: Oral Oral    SpO2: 98%     Weight:  79.6 kg    Height:        Intake/Output Summary (Last 24 hours) at 02/20/2020 1009 Last data filed at 02/20/2020 0856 Gross per 24 hour  Intake 400.9 ml  Output 1300 ml  Net -899.1 ml   Last 3 Weights 02/20/2020 02/19/2020 02/18/2020  Weight (lbs) 175 lb 8 oz 169 lb 15.6 oz 180 lb  Weight (kg) 79.606 kg 77.1 kg 81.647 kg      Telemetry    NSR, occ PVCs - Personally Reviewed  ECG    No new tracing - Personally Reviewed  Physical Exam  Relaxed, lying almost fully flat GEN: No acute distress.   Neck: No JVD Cardiac: RRR, 1/6 systolic murmur, no diastolic murmurs, rubs, or gallops.  Respiratory: Clear to auscultation bilaterally. GI: Soft, nontender, non-distended  MS: No edema; No deformity. Neuro:  Nonfocal  Psych: Normal  affect   Labs    High Sensitivity Troponin:   Recent Labs  Lab 02/18/20 0641 02/18/20 0844  TROPONINIHS 70* 70*      Chemistry Recent Labs  Lab 02/18/20 0641 02/19/20 0221 02/20/20 0331  NA 140 139 137  K 4.0 4.1 3.9  CL 107 107 106  CO2 24 27 27   GLUCOSE 213* 121* 104*  BUN 28* 29* 37*  CREATININE 2.23* 2.16* 2.28*  CALCIUM 9.0 8.7* 8.3*  PROT 7.4  --   --   ALBUMIN 3.2*  --   --   AST 15  --   --   ALT 16  --   --   ALKPHOS 74  --   --   BILITOT 1.1  --   --   GFRNONAA 29* 30* 28*  GFRAA 33* 34* 32*  ANIONGAP 9 5 4*     Hematology Recent Labs  Lab 02/18/20 0641 02/19/20 0221  WBC 7.4 4.4  RBC 3.72* 3.20*  HGB 11.9* 10.1*  HCT 39.0 32.5*  MCV 104.8* 101.6*  MCH 32.0 31.6  MCHC 30.5 31.1  RDW 13.1 13.1  PLT 175 167    BNP Recent Labs  Lab 02/18/20 0641  BNP 1,163.2*     DDimer No results for input(s): DDIMER in the last 168 hours.   Radiology    ECHOCARDIOGRAM LIMITED  Result Date: 02/19/2020    ECHOCARDIOGRAM LIMITED REPORT   Patient Name:   Robert Chaney Date of Exam: 02/19/2020 Medical Rec #:  [id number]     Height:       68.0 in Accession #:    [id number]    Weight:       170.0 lb Date of Birth:  07-31-1950      BSA:          1.907 m Patient Age:    69 years      BP: Patient Gender: M             HR: Exam Location:  Referring Phys: 59 Jahmeer Porche  FINDINGS  Left Ventricle: Limited study with Valsalva does not show any significant LVOT gradient to suggest obstruction. Rozann Lesches MD Electronically signed by Rozann Lesches MD Signature Date/Time: 02/19/2020/3:42:48 PM    Final     Cardiac Studies   As above  Patient Profile     69 y.o. male with HCM and asymmetrical septal hypertrophy, diastolic HF, severe HTN, CKD IV, admitted with flash pulmonary edema and acute hypoxic respiratory failure  Assessment & Plan    1. Pulmonary edema/Acute on chronic diastolic HF: resolved. Etiology uncertain. Reports compliance with meds. No  arrhythmia detected. Consider renal artery stenosis, statistically less likely to be pheochromocytoma. Clinically euvolemic, restart home dose of diuretics.  2. HCM: no evidence of dynamic LV outflow obstruction even with provocative maneuvers while on IV NTG.  Will further increase the carvedilol dose. Cause of diastolic HF, acute on chronic. 3. HTN: severe, requiring multiple agents for control. Evaluate for renal artery stenosis - will schedule as an OP. 4. CKD 4: stable creatinine. This appears to be his baseline.   For questions or updates, please contact [place] Please consult [email or web address] for contact info under        Signed, Sanda Klein, MD  02/20/2020, 10:09 AM

## 2020-02-21 ENCOUNTER — Inpatient Hospital Stay (HOSPITAL_COMMUNITY): Payer: PPO

## 2020-02-21 ENCOUNTER — Telehealth: Payer: Self-pay

## 2020-02-21 DIAGNOSIS — E785 Hyperlipidemia, unspecified: Secondary | ICD-10-CM

## 2020-02-21 DIAGNOSIS — I150 Renovascular hypertension: Secondary | ICD-10-CM

## 2020-02-21 LAB — BASIC METABOLIC PANEL
Anion gap: 6 (ref 5–15)
BUN: 39 mg/dL — ABNORMAL HIGH (ref 8–23)
CO2: 28 mmol/L (ref 22–32)
Calcium: 8.4 mg/dL — ABNORMAL LOW (ref 8.9–10.3)
Chloride: 104 mmol/L (ref 98–111)
Creatinine, Ser: 2.46 mg/dL — ABNORMAL HIGH (ref 0.61–1.24)
GFR calc Af Amer: 29 mL/min — ABNORMAL LOW (ref >60)
GFR calc non Af Amer: 25 mL/min — ABNORMAL LOW (ref >60)
Glucose, Bld: 101 mg/dL — ABNORMAL HIGH (ref 70–99)
Potassium: 4.2 mmol/L (ref 3.5–5.1)
Sodium: 138 mmol/L (ref 135–145)

## 2020-02-21 LAB — GLUCOSE, CAPILLARY
Glucose-Capillary: 76 mg/dL (ref 70–99)
Glucose-Capillary: 110 mg/dL — ABNORMAL HIGH (ref 70–99)

## 2020-02-21 MED ORDER — CLONIDINE HCL 0.2 MG PO TABS: 0.2000 mg | ORAL_TABLET | Freq: Three times a day (TID) | ORAL | Status: DC

## 2020-02-21 MED ORDER — CARVEDILOL 25 MG PO TABS: 50.0000 mg | ORAL_TABLET | Freq: Two times a day (BID) | ORAL | 0 refills | Status: DC

## 2020-02-21 MED ORDER — CLONIDINE HCL 0.2 MG PO TABS: 0.2000 mg | ORAL_TABLET | Freq: Three times a day (TID) | ORAL | 0 refills | Status: DC

## 2020-02-21 NOTE — Progress Notes (Signed)
Progress Note  Patient Name: GRAYSEN WOODYARD Date of Encounter: 02/21/2020  [facility] Cardiologist: Ena Dawley, MD   Subjective   Denies any CP or SOB.   Inpatient Medications    Scheduled Meds: . amLODipine  5 mg Oral Daily  . atorvastatin  20 mg Oral Daily  . brimonidine  1 drop Right Eye BID  . carvedilol  50 mg Oral BID WC  . cloNIDine  0.1 mg Oral TID  . docusate sodium  100 mg Oral BID  . enoxaparin (LOVENOX) injection  30 mg Subcutaneous Q24H  . furosemide  40 mg Oral Daily  . hydrALAZINE  100 mg Oral BID  . insulin aspart  0-5 Units Subcutaneous QHS  . insulin aspart  0-9 Units Subcutaneous TID WC  . insulin glargine  18 Units Subcutaneous QHS  . latanoprost  1 drop Left Eye QHS  . sodium chloride flush  3 mL Intravenous Q12H   Continuous Infusions:  PRN Meds: acetaminophen **OR** acetaminophen, morphine injection, ondansetron **OR** ondansetron (ZOFRAN) IV   Vital Signs    Vitals:   02/20/20 1741 02/20/20 2125 02/20/20 2333 02/21/20 0335  BP: (!) 155/70 (!) 154/76 (!) 125/56 (!) 155/81  Pulse:   61 62  Resp:      Temp:   98.6 F (37 C) 98.7 F (37.1 C)  TempSrc:   Axillary Oral  SpO2:   99% 100%  Weight:    79.6 kg  Height:        Intake/Output Summary (Last 24 hours) at 02/21/2020 0749 Last data filed at 02/21/2020 0335 Gross per 24 hour  Intake 705.48 ml  Output 1175 ml  Net -469.52 ml   Last 3 Weights 02/21/2020 02/20/2020 02/19/2020  Weight (lbs) 175 lb 7.8 oz 175 lb 8 oz 169 lb 15.6 oz  Weight (kg) 79.6 kg 79.606 kg 77.1 kg      Telemetry    NSR without significant ventricular ectopy - Personally Reviewed  ECG    NSR without significant ST-T wave changes - Personally Reviewed  Physical Exam   GEN: No acute distress.   Neck: No JVD Cardiac: RRR, no murmurs, rubs, or gallops.  Respiratory: Clear to auscultation bilaterally. GI: Soft, nontender, non-distended  MS: No edema; No deformity. Neuro:  Nonfocal  Psych: Normal  affect   Labs    High Sensitivity Troponin:   Recent Labs  Lab 02/18/20 0641 02/18/20 0844  TROPONINIHS 70* 70*      Chemistry Recent Labs  Lab 02/18/20 0641 02/18/20 0641 02/19/20 0221 02/20/20 0331 02/21/20 0235  NA 140   < > 139 137 138  K 4.0   < > 4.1 3.9 4.2  CL 107   < > 107 106 104  CO2 24   < > 27 27 28   GLUCOSE 213*   < > 121* 104* 101*  BUN 28*   < > 29* 37* 39*  CREATININE 2.23*   < > 2.16* 2.28* 2.46*  CALCIUM 9.0   < > 8.7* 8.3* 8.4*  PROT 7.4  --   --   --   --   ALBUMIN 3.2*  --   --   --   --   AST 15  --   --   --   --   ALT 16  --   --   --   --   ALKPHOS 74  --   --   --   --   BILITOT 1.1  --   --   --   --  GFRNONAA 29*   < > 30* 28* 25*  GFRAA 33*   < > 34* 32* 29*  ANIONGAP 9   < > 5 4* 6   < > = values in this interval not displayed.     Hematology Recent Labs  Lab 02/18/20 0641 02/19/20 0221  WBC 7.4 4.4  RBC 3.72* 3.20*  HGB 11.9* 10.1*  HCT 39.0 32.5*  MCV 104.8* 101.6*  MCH 32.0 31.6  MCHC 30.5 31.1  RDW 13.1 13.1  PLT 175 167    BNP Recent Labs  Lab 02/18/20 0641  BNP 1,163.2*     DDimer No results for input(s): DDIMER in the last 168 hours.   Radiology    ECHOCARDIOGRAM LIMITED  Result Date: 02/19/2020    ECHOCARDIOGRAM LIMITED REPORT   Patient Name:   ANTINO MAYABB Date of Exam: 02/19/2020 Medical Rec #:  [id number]     Height:       68.0 in Accession #:    [id number]    Weight:       170.0 lb Date of Birth:  1951/01/29      BSA:          1.907 m Patient Age:    69 years      BP: Patient Gender: M             HR: Exam Location:  Referring Phys: [id number] MIHA[address] CROITORU  FINDINGS  Left Ventricle: Limited study with Valsalva does not show any significant LVOT gradient to suggest obstruction. Rozann Lesches MD Electronically signed by Rozann Lesches MD Signature Date/Time: 02/19/2020/3:42:48 PM    Final     Cardiac Studies   Limited echo 02/19/2020 FINDINGS  Left Ventricle: Limited study with Valsalva does not show any  significant  LVOT gradient to suggest obstruction.   Patient Profile     69 y.o. male with PMH of HCM, asymmetrical septal hypertrophy, chronic diastolic CHF, HTN, CKD stage IV who was admitted with flash pulmonary edema and acute hypoxic respiratory failure  Assessment & Plan    1. Acute on chronic diastolic heart failure / Pulmonary edema  - euvolemic on exam, restarted home lasix 40mg  daily since yesterday  - Cr trended up slightly, consider repeat BMET in 3 days as outpatient, if continue to trend up despite on PO lasix, will need to consider adjust lasix dose  2. HCM  - Repeat limited echo on 02/19/2020 showed no significant LVOT gradient   3. HTN  - currently on amlodipine 5mg  daily, coreg 50mg  BID, clonidine 0.1 mg TID, and hydralazine 100mg  BID.   - SBP still elevated this morning, consider either increase amlodipine to 10mg  daily or uptitrate clonidine to 0.2 mg BID (from 0.1mg  TID to allow better compliance).   - awaiting renal artery doppler, if negative, patient can be discharged today  4. HLD: on lipitor  5. DM II  6. CKD stage IV: Cr trending up in the past 3 days, will need BMET in 3 days after discharge.       For questions or updates, please contact [place] Please consult [email or web address] for contact info under        Signed, Almyra Deforest, [place]  02/21/2020, 7:49 AM

## 2020-02-21 NOTE — Progress Notes (Signed)
Patient refused CPAP for tonight. States he did not do well with it last night. Patient aware to call for Respiratory if needed.

## 2020-02-21 NOTE — Discharge Summary (Addendum)
Physician Discharge Summary  Robert Chaney HWE:[id number] DOB: 05/26/1951 DOA: 02/18/2020  PCP: [name], MD  Admit date: 02/18/2020 Discharge date: 02/21/2020  Admitted From: Home Disposition: Home  Recommendations for Outpatient Follow-up:  1. Follow up with PCP in 1 week 2. Follow up with cardiology as scheduled on 9/9 3. Please follow-up final results for plasma catecholamines/metanephrine to assess with pheochromocytoma as well as renin/aldosterone level for hyperaldosteronism work-up 4. Establish with [facility] after discharge due to CKD   Discharge Condition: Stable CODE STATUS: Full code Diet recommendation:  Diet Orders (From admission, onward)    Start     Ordered   02/21/20 1131  Diet Heart Room service appropriate? Yes; Fluid consistency: Thin  Diet effective now       Question Answer Comment  Room service appropriate? Yes   Fluid consistency: Thin      02/21/20 1130   02/21/20 0000  Diet - low sodium heart healthy        02/21/20 1250          Brief/Interim Summary: Robert Chaney is a 69 y.o.malewith medical history significant ofhypertrophic cardiomyopathy with ectopy; HTN; HLD; glacuoma; DM; chronic diastolic CHF; and stage 3 CKD presenting with SOB/cough.He reports that he has been having trouble breathing since about midnight. He checked his O2 sats and they were 70 so he called 911. His BP is often uncontrolled but improves if he takes his medication. He reports medication compliance.   In the emergency department he was found to be in hypertensive crisis, was placed on BiPAP and started on nitro drip.  Cardiology was consulted.  Patient was weaned off BiPAP as well as nitro drip.  His medication dosage was adjusted.  He underwent renal artery stenosis work-up in the hospital.  He will have close follow-up with cardiology as an outpatient.  Discharge Diagnoses:  Principal Problem:   Hypertensive crisis Active Problems:   DM  (diabetes mellitus) with complications ([place])   Stage 4 chronic kidney disease ([place])   Essential hypertension   Dyslipidemia   Acute exacerbation of CHF (congestive heart failure) (HCC)   Hypertensive crisis with pulmonary edema -Appreciate cardiology -Chest x-ray with cardiomegaly, diffuse bilateral pulmonary infiltrates/edema -Weaned off BiPAP -Off nitro drip -Continue home medications including Norvasc, Coreg, Catapres, hydralazine, lasix .  Dose adjusted by cardiology today  Acute on chronic hypoxemic respiratory failure -Weaned off BiPAP -Now on his baseline oxygen level  Hyperlipidemia -Continue Lipitor  Type 2 diabetes, well controlled -Continue lantus and sliding scale insulin  CKD stage IV -Baseline creatinine 2.5 -Stable  -Recommend outpatient nephrology follow-up  OSA -Continue CPAP nightly  Demand ischemia -Elevated troponin trend has been flat, no complaints of chest pain   Discharge Instructions  Discharge Instructions    (HEART FAILURE PATIENTS) Call MD:  Anytime you have any of the following symptoms: 1) 3 pound weight gain in 24 hours or 5 pounds in 1 week 2) shortness of breath, with or without a dry hacking cough 3) swelling in the hands, feet or stomach 4) if you have to sleep on extra pillows at night in order to breathe.   Complete by: As directed    Call MD for:  difficulty breathing, headache or visual disturbances   Complete by: As directed    Call MD for:  extreme fatigue   Complete by: As directed    Call MD for:  persistant dizziness or light-headedness   Complete by: As directed    Call  MD for:  persistant nausea and vomiting   Complete by: As directed    Call MD for:  severe uncontrolled pain   Complete by: As directed    Call MD for:  temperature >100.4   Complete by: As directed    Diet - low sodium heart healthy   Complete by: As directed    Discharge instructions   Complete by: As directed    You were cared for by a  hospitalist during your hospital stay. If you have any questions about your discharge medications or the care you received while you were in the hospital after you are discharged, you can call the unit and ask to speak with the hospitalist on call if the hospitalist that took care of you is not available. Once you are discharged, your primary care physician will handle any further medical issues. Please note that NO REFILLS for any discharge medications will be authorized once you are discharged, as it is imperative that you return to your primary care physician (or establish a relationship with a primary care physician if you do not have one) for your aftercare needs so that they can reassess your need for medications and monitor your lab values.   Increase activity slowly   Complete by: As directed      Allergies as of 02/21/2020      Reactions   Aspirin Itching      Medication List    STOP taking these medications   isosorbide mononitrate 60 MG 24 hr tablet Commonly known as: IMDUR     TAKE these medications   amLODipine 5 MG tablet Commonly known as: NORVASC Take 1 tablet (5 mg total) by mouth at bedtime.   atorvastatin 20 MG tablet Commonly known as: LIPITOR Take 1 tablet (20 mg total) by mouth daily.   brimonidine 0.2 % ophthalmic solution Commonly known as: ALPHAGAN Place 1 drop into the right eye 2 (two) times daily.   carvedilol 25 MG tablet Commonly known as: COREG Take 2 tablets (50 mg total) by mouth 2 (two) times daily. What changed: how much to take   cholecalciferol 25 MCG (1000 UNIT) tablet Commonly known as: VITAMIN D3 Take 1,000 Units by mouth daily.   cloNIDine 0.2 MG tablet Commonly known as: CATAPRES Take 1 tablet (0.2 mg total) by mouth 3 (three) times daily. What changed:   medication strength  how much to take   diclofenac Sodium 1 % Gel Commonly known as: VOLTAREN Apply 4 g topically 4 (four) times daily as needed for pain.   furosemide 20  MG tablet Commonly known as: LASIX Take 2 tablets (40 mg total) by mouth daily.   hydrALAZINE 100 MG tablet Commonly known as: APRESOLINE Take 1 tablet (100 mg total) by mouth 2 (two) times daily.   insulin aspart 100 UNIT/ML injection Commonly known as: novoLOG Inject 0-6 Units into the skin 3 (three) times daily with meals. Sliding scale   Insulin Pen Needle 31G X 5 MM Misc Commonly known as: B-D UF III MINI PEN NEEDLES Four times daily   latanoprost 0.005 % ophthalmic solution Commonly known as: XALATAN Place 1 drop into the left eye at bedtime.   nitroGLYCERIN 0.4 MG SL tablet Commonly known as: NITROSTAT Place 1 tablet (0.4 mg total) under the tongue every 5 (five) minutes as needed for chest pain.   OneTouch Verio test strip Generic drug: glucose blood Use as instructed to test blood sugar 3 times daily E11.65   Toujeo Max  SoloStar 300 UNIT/ML Sopn Generic drug: Insulin Glargine Inject 18 Units into the skin at bedtime.       Follow-up Information    [name], MD Follow up.   Specialty: Internal Medicine Contact information: [address] [place] [address] [phone]        Dorothy Spark, MD Follow up.   Specialty: Cardiology Contact information: [place] [address] [phone]        [facility] Call.   Specialty: Nephrology Contact information: [address] [place] [address] [phone]              Allergies  Allergen Reactions  . Aspirin Itching    Consultations:  Cardiology    Procedures/Studies: DG Chest Portable 1 View  Result Date: 02/18/2020 CLINICAL DATA:  Cough.  Shortness of breath. EXAM: PORTABLE CHEST 1 VIEW COMPARISON:  Chest x-ray 01/15/2020. FINDINGS: Cardiomegaly with pulmonary venous congestion. Bilateral pulmonary infiltrates/edema. Previously identified nodule noted over the left lung base not visualized due  to pulmonary infiltrates/edema. No pleural effusion or pneumothorax. IMPRESSION: 1. Cardiomegaly with diffuse bilateral pulmonary infiltrates/edema. CHF could present this fashion. Mild lateral pneumonia cannot be excluded. 2. Previously identified left base pulmonary nodule not identified due to bilateral pulmonary infiltrates/edema. Continued follow-up exam suggested. Electronically Signed   By: Marcello Moores  Register   On: 02/18/2020 07:39   VAS US RENAL ARTERY DUPLEX  Result Date: 02/21/2020 ABDOMINAL VISCERAL Indications: HTN High Risk Factors: Hypertension, hyperlipidemia, Diabetes. Limitations: Air/bowel gas and l. Performing Technologist: Abram Sander RVS  Examination Guidelines: A complete evaluation includes B-mode imaging, spectral Doppler, color Doppler, and power Doppler as needed of all accessible portions of each vessel. Bilateral testing is considered an integral part of a complete examination. Limited examinations for reoccurring indications may be performed as noted.  Duplex Findings: +------------+--------+--------+------+--------+ Mesenteric  PSV cm/sEDV cm/sPlaqueComments +------------+--------+--------+------+--------+ Aorta Prox     82                          +------------+--------+--------+------+--------+ SMA Proximal  403                          +------------+--------+--------+------+--------+    +------------------+--------+--------+-------+ Right Renal ArteryPSV cm/sEDV cm/sComment +------------------+--------+--------+-------+ Origin              136      18           +------------------+--------+--------+-------+ Proximal            107      18           +------------------+--------+--------+-------+ Mid                 109      21           +------------------+--------+--------+-------+ Distal              101      14           +------------------+--------+--------+-------+ +-----------------+--------+--------+-------+ Left Renal ArteryPSV  cm/sEDV cm/sComment +-----------------+--------+--------+-------+ Origin              75      22           +-----------------+--------+--------+-------+ Proximal            84      24           +-----------------+--------+--------+-------+  Mid                 98      29           +-----------------+--------+--------+-------+ Distal              62      14           +-----------------+--------+--------+-------+  Technologist observations: Unable to visualize segmentals & arcuate arteries well. +------------+--------+--------+----+-----------+--------+--------+----+ Right KidneyPSV cm/sEDV cm/sRI  Left KidneyPSV cm/sEDV cm/sRI   +------------+--------+--------+----+-----------+--------+--------+----+ Upper Pole                      Upper Pole                      +------------+--------+--------+----+-----------+--------+--------+----+ Mid         35      8       0.77Mid        28      10      0.63 +------------+--------+--------+----+-----------+--------+--------+----+ Lower Pole  20      6       0.73Lower Pole                      +------------+--------+--------+----+-----------+--------+--------+----+ Hilar       38      13      0.67Hilar      19      12      0.40 +------------+--------+--------+----+-----------+--------+--------+----+ +------------------+-----+------------------+----+ Right Kidney           Left Kidney            +------------------+-----+------------------+----+ RAR                    RAR                    +------------------+-----+------------------+----+ RAR (manual)           RAR (manual)           +------------------+-----+------------------+----+ Cortex                 Cortex                 +------------------+-----+------------------+----+ Cortex thickness       Corex thickness        +------------------+-----+------------------+----+ Kidney length (cm)10.52Kidney length (cm)9.86  +------------------+-----+------------------+----+   Summary: Renal:  Right: Normal size right kidney. Abnormal right Resistive Index.        1-59% stenosis of the right renal artery. Left:  Normal size of left kidney. Normal left Resistive Index. No        evidence of left renal artery stenosis. 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  *See table(s) above for measurements and observations.     Preliminary    ECHOCARDIOGRAM LIMITED  Result Date: 02/19/2020    ECHOCARDIOGRAM LIMITED REPORT   Patient Name:   Robert Chaney Date of Exam: 02/19/2020 Medical Rec #:  [id number]     Height:       68.0 in Accession #:    [id number]    Weight:       170.0 lb Date of Birth:  1951-03-11      BSA:          1.907 m Patient Age:    41 years      BP: Patient Gender: M  HR: Exam Location:  Referring Phys: [id number]  FINDINGS  Left Ventricle: Limited study with Valsalva does not show any significant LVOT gradient to suggest obstruction. Rozann Lesches MD Electronically signed by Rozann Lesches MD Signature Date/Time: 02/19/2020/3:42:48 PM    Final        Discharge Exam: Vitals:   02/21/20 0800 02/21/20 1058  BP: (!) 187/83 (!) 163/70  Pulse:  66  Resp:  16  Temp:  98.3 F (36.8 C)  SpO2:  91%    General: Pt is alert, awake, not in acute distress Cardiovascular: RRR, S1/S2 +, no edema Respiratory: CTA bilaterally, no wheezing, no rhonchi, no respiratory distress, no conversational dyspnea, [place] O2  Abdominal: Soft, NT, ND, bowel sounds + Extremities: no edema, no cyanosis Psych: Normal mood and affect, stable judgement and insight     The results of significant diagnostics from this hospitalization (including imaging, microbiology, ancillary and laboratory) are listed below for reference.     Microbiology: Recent Results (from the past 240 hour(s))  SARS Coronavirus 2 by RT PCR (hospital order, performed in [facility] hospital lab) Nasopharyngeal  Nasopharyngeal Swab     Status: None   Collection Time: 02/18/20  6:45 AM   Specimen: Nasopharyngeal Swab  Result Value Ref Range Status   SARS Coronavirus 2 NEGATIVE NEGATIVE Final    Comment: (NOTE) SARS-CoV-2 target nucleic acids are NOT DETECTED.  The SARS-CoV-2 RNA is generally detectable in upper and lower respiratory specimens during the acute phase of infection. The lowest concentration of SARS-CoV-2 viral copies this assay can detect is 250 copies / mL. A negative result does not preclude SARS-CoV-2 infection and should not be used as the sole basis for treatment or other patient management decisions.  A negative result may occur with improper specimen collection / handling, submission of specimen other than nasopharyngeal swab, presence of viral mutation(s) within the areas targeted by this assay, and inadequate number of viral copies (<250 copies / mL). A negative result must be combined with clinical observations, patient history, and epidemiological information.  Fact Sheet for Patients:   StrictlyIdeas.no  Fact Sheet for Healthcare Providers: BankingDealers.co.za  This test is not yet approved or  cleared by the [place] FDA and has been authorized for detection and/or diagnosis of SARS-CoV-2 by FDA under an Emergency Use Authorization (EUA).  This EUA will remain in effect (meaning this test can be used) for the duration of the COVID-19 declaration under Section 564(b)(1) of the Act, 21 U.S.C. section 360bbb-3(b)(1), unless the authorization is terminated or revoked sooner.  Performed at [facility], [place] [address] [place], [place] [address]      Labs: BNP (last 3 results) Recent Labs    01/11/20 0545 02/18/20 0641  BNP 628.4* 6,045.4*   Basic Metabolic Panel: Recent Labs  Lab 02/18/20 0641 02/19/20 0221 02/20/20 0331 02/21/20 0235  NA 140 139 137 138  K 4.0 4.1 3.9 4.2  CL 107 107 106  104  CO2 24 27 27 28   GLUCOSE 213* 121* 104* 101*  BUN 28* 29* 37* 39*  CREATININE 2.23* 2.16* 2.28* 2.46*  CALCIUM 9.0 8.7* 8.3* 8.4*   Liver Function Tests: Recent Labs  Lab 02/18/20 0641  AST 15  ALT 16  ALKPHOS 74  BILITOT 1.1  PROT 7.4  ALBUMIN 3.2*   No results for input(s): LIPASE, AMYLASE in the last 168 hours. No results for input(s): AMMONIA in the last 168 hours. CBC: Recent Labs  Lab 02/18/20  4196 02/19/20 0221  WBC 7.4 4.4  NEUTROABS 6.0  --   HGB 11.9* 10.1*  HCT 39.0 32.5*  MCV 104.8* 101.6*  PLT 175 167   Cardiac Enzymes: No results for input(s): CKTOTAL, CKMB, CKMBINDEX, TROPONINI in the last 168 hours. BNP: Invalid input(s): POCBNP CBG: Recent Labs  Lab 02/20/20 1634 02/20/20 1812 02/20/20 2025 02/21/20 0819 02/21/20 1135  GLUCAP 62* 178* 156* 76 110*   D-Dimer No results for input(s): DDIMER in the last 72 hours. Hgb A1c No results for input(s): HGBA1C in the last 72 hours. Lipid Profile No results for input(s): CHOL, HDL, LDLCALC, TRIG, CHOLHDL, LDLDIRECT in the last 72 hours. Thyroid function studies No results for input(s): TSH, T4TOTAL, T3FREE, THYROIDAB in the last 72 hours.  Invalid input(s): FREET3 Anemia work up No results for input(s): VITAMINB12, FOLATE, FERRITIN, TIBC, IRON, RETICCTPCT in the last 72 hours. Urinalysis    Component Value Date/Time   COLORURINE YELLOW 07/31/2017 [place] 07/31/2017 1840   LABSPEC 1.020 07/31/2017 1840   PHURINE 6.0 07/31/2017 1840   GLUCOSEU >=500 (A) 07/31/2017 1840   HGBUR NEGATIVE 07/31/2017 1840   BILIRUBINUR NEGATIVE 07/31/2017 1840   KETONESUR 5 (A) 07/31/2017 [place] 07/31/2017 1840   UROBILINOGEN 1.0 07/19/2011 1335   NITRITE POSITIVE (A) 07/31/2017 1840   LEUKOCYTESUR NEGATIVE 07/31/2017 1840   Sepsis Labs Invalid input(s): PROCALCITONIN,  WBC,  LACTICIDVEN Microbiology Recent Results (from the past 240 hour(s))  SARS Coronavirus 2 by  RT PCR (hospital order, performed in [facility] hospital lab) Nasopharyngeal Nasopharyngeal Swab     Status: None   Collection Time: 02/18/20  6:45 AM   Specimen: Nasopharyngeal Swab  Result Value Ref Range Status   SARS Coronavirus 2 NEGATIVE NEGATIVE Final    Comment: (NOTE) SARS-CoV-2 target nucleic acids are NOT DETECTED.  The SARS-CoV-2 RNA is generally detectable in upper and lower respiratory specimens during the acute phase of infection. The lowest concentration of SARS-CoV-2 viral copies this assay can detect is 250 copies / mL. A negative result does not preclude SARS-CoV-2 infection and should not be used as the sole basis for treatment or other patient management decisions.  A negative result may occur with improper specimen collection / handling, submission of specimen other than nasopharyngeal swab, presence of viral mutation(s) within the areas targeted by this assay, and inadequate number of viral copies (<250 copies / mL). A negative result must be combined with clinical observations, patient history, and epidemiological information.  Fact Sheet for Patients:   StrictlyIdeas.no  Fact Sheet for Healthcare Providers: BankingDealers.co.za  This test is not yet approved or  cleared by the [place] FDA and has been authorized for detection and/or diagnosis of SARS-CoV-2 by FDA under an Emergency Use Authorization (EUA).  This EUA will remain in effect (meaning this test can be used) for the duration of the COVID-19 declaration under Section 564(b)(1) of the Act, 21 U.S.C. section 360bbb-3(b)(1), unless the authorization is terminated or revoked sooner.  Performed at [facility], [place] [address], [place],  [address]      Patient was seen and examined on the day of discharge and was found to be in stable condition. Time coordinating discharge: 35 minutes including assessment and coordination of care, as  well as examination of the patient.   SIGNED:  Dessa Phi, DO [facility] 02/21/2020, 12:50 PM

## 2020-02-21 NOTE — Progress Notes (Signed)
Lower extremity venous has been completed.   Preliminary results in CV Proc.   Abram Sander 02/21/2020 10:03 AM

## 2020-02-21 NOTE — Telephone Encounter (Signed)
**Note De-identified Bentlie Withem Obfuscation** -----  **Note De-Identified Lillard Bailon Obfuscation** Message from Johnna Acosta sent at 02/21/2020 10:54 AM EDT ----- Regarding: RE: TOC visit Pt is scheduled for [facility] Visit with Robbie Lis 03/02/2020 at 11:15 am. Dr. Francesca Oman care team did not have any available TOC appointments ----- Message ----- From: Dennie Fetters, LPN Sent: 9/47/6546   9:47 AM EDT To: [facility] Scheduling Subject: FW: TOC visit                                   ----- Message ----- From: Abigail Butts., PA-C Sent: 02/20/2020  10:16 AM EDT To: C[facility] Toc Subject: TOC visit                                      Please schedule this patient for a follow-up appointment and call them with that information.  Primary Cardiologist: Dr. Meda Coffee Date of Discharge: 02/20/2020 Appointment Needed Within: 1-2 weeks Appointment Type: TOC visit with Dr. Marjo Bicker  Thank you!  Roby Lofts, PA-C

## 2020-02-22 NOTE — Telephone Encounter (Signed)
**Note De-Identified Robert Chaney Obfuscation** Patient contacted regarding discharge from [facility] on 02/21/2020.  Patient understands to follow up with provider Robbie Lis, PA-c on 03/02/2020 at 11:15 at [address] [place] in [place], [place] [address]. Patient understands discharge instructions? Yes Patient understands medications and regiment? Yes Patient understands to bring all medications to this visit? Yes  Pt states that he is "feeling fine" and has no complaints at this time.  Ask patient:  Are you enrolled in My Chart: No, he states that he has no computer and is not interested at this time.

## 2020-02-24 LAB — ALDOSTERONE + RENIN ACTIVITY W/ RATIO
ALDO / PRA Ratio: 13.4 (ref 0.0–30.0)
Aldosterone: 2.6 ng/dL (ref 0.0–30.0)
PRA LC/MS/MS: 0.194 ng/mL/hr (ref 0.167–5.380)

## 2020-02-25 LAB — METANEPHRINES, PLASMA
Metanephrine, Free: 80.3 pg/mL (ref 0.0–88.0)
Normetanephrine, Free: 269.7 pg/mL — ABNORMAL HIGH (ref 0.0–191.8)

## 2020-03-02 ENCOUNTER — Encounter: Payer: Self-pay | Admitting: Physician Assistant

## 2020-03-02 ENCOUNTER — Ambulatory Visit: Payer: PPO | Admitting: Physician Assistant

## 2020-03-02 ENCOUNTER — Other Ambulatory Visit: Payer: Self-pay

## 2020-03-02 VITALS — BP 150/76 | HR 56 | Ht 68.0 in | Wt 182.0 lb

## 2020-03-02 DIAGNOSIS — N184 Chronic kidney disease, stage 4 (severe): Secondary | ICD-10-CM

## 2020-03-02 DIAGNOSIS — I130 Hypertensive heart and chronic kidney disease with heart failure and stage 1 through stage 4 chronic kidney disease, or unspecified chronic kidney disease: Secondary | ICD-10-CM | POA: Diagnosis not present

## 2020-03-02 DIAGNOSIS — I10 Essential (primary) hypertension: Secondary | ICD-10-CM | POA: Diagnosis not present

## 2020-03-02 DIAGNOSIS — G4733 Obstructive sleep apnea (adult) (pediatric): Secondary | ICD-10-CM

## 2020-03-02 DIAGNOSIS — I422 Other hypertrophic cardiomyopathy: Secondary | ICD-10-CM | POA: Diagnosis not present

## 2020-03-02 MED ORDER — NITROGLYCERIN 0.4 MG SL SUBL: 0.4000 mg | SUBLINGUAL_TABLET | SUBLINGUAL | 2 refills | Status: DC | PRN

## 2020-03-02 MED ORDER — ASPIRIN EC 81 MG PO TBEC: 81.0000 mg | DELAYED_RELEASE_TABLET | Freq: Every day | ORAL | 3 refills | Status: DC

## 2020-03-02 NOTE — Patient Instructions (Signed)
Medication Instructions:  Your physician has recommended you make the following change in your medication:  1.  RESTART Aspirin 81 mg daily  *If you need a refill on your cardiac medications before your next appointment, please call your pharmacy*   Lab Work: None ordered  If you have labs (blood work) drawn today and your tests are completely normal, you will receive your results only by: Marland Kitchen MyChart Message (if you have MyChart) OR . A paper copy in the mail If you have any lab test that is abnormal or we need to change your treatment, we will call you to review the results.   Testing/Procedures: None ordered  Follow-Up: Your physician recommends that you schedule a follow-up appointment in:  [address] [place]    Follow-Up: At [facility], you and your health needs are our priority.  As part of our continuing mission to provide you with exceptional heart care, we have created designated Provider Care Teams.  These Care Teams include your primary Cardiologist (physician) and Advanced Practice Providers (APPs -  Physician Assistants and Nurse Practitioners) who all work together to provide you with the care you need, when you need it.  We recommend signing up for the patient portal called "MyChart".  Sign up information is provided on this After Visit Summary.  MyChart is used to connect with patients for Virtual Visits (Telemedicine).  Patients are able to view lab/test results, encounter notes, upcoming appointments, etc.  Non-urgent messages can be sent to your provider as well.   To learn more about what you can do with MyChart, go to [email or web address].    Your next appointment:   3 month(s)  The format for your next appointment:   In Person  Provider:   Ena Dawley, MD   Other Instructions

## 2020-03-02 NOTE — Progress Notes (Signed)
Cardiology Office Note    Date:  03/02/2020   ID:  Robert Chaney, DOB 03-Jun-1951, MRN [id number]  PCP:  [name], MD  Cardiologist:  Ena Dawley, MD  Hypertension clinic: Dr. Oval Linsey  Chief Complaint: Hospital follow up   History of Present Illness:   Robert Chaney is a 69 y.o. male with hypertrophic cardiomyopathy, chronic diastolic heart failure, chronic kidney disease stage IV, hypertension followed in hypertension clinic, hyperlipidemia, obstructive sleep apnea on CPAP, and obesity seen for hospital follow up.   Echocardiographic evaluation 2017 was suspicious for HCM. He underwent cMRI confirming HCM with a basal septum of 21 mm patchy LGE over repeated assessments has had variability of the presence of of an outflow gradient.  No hx of syncope.   Low risk stress test February 2019.  Echocardiogram July 2021 with LV function of 60 to 41%, grade 2 diastolic dysfunction. No significant LVOT gradient on  this study (HR 63 bpm).   Admitted 01/2019 with acute diastolic CHF/pulmonary edema of uncertain etiology. Restarted home lasix. His home dose of carvedilol was increased and BP remains elevated. increased clonidine to 0.2 mg tid.  Given his LE edema,  avoided increasing amlodipine if possible.   Renal artery doppler with mild stenosis on R and none on the left.  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 Repeat limited echo on 02/19/2020 showed no significant LVOT gradient.  Here today for follow up.  Patient reports doing better after discharge.  He uses CPAP at night and oxygen intermittently during daytime.  Patient reports chronic dyspnea on exertion with walking uphill or stairs.  He has mild discomfort with this but reports this is chronic.  Prior stress test negative.  Has chronic mild lower extremity edema.  Blood pressure systolically in 660Y to 301S.  Denies dizziness, palpitation, syncope or melena.    Past Medical History:  Diagnosis Date  . Altered  mental status    a. 05/2017 - adm with blurred vision, somnolence in setting of AKI and high blood sugar.  . Anemia   . CKD (chronic kidney disease), stage III   . Diabetes mellitus   . Diastolic CHF, chronic (HCC)    A.  03/2009 Echo: EF 60-65%, Gr II diast dysfxn  . Elevated troponin    a. 2013 - troponin 1.4. b. 2019 - troponin 0.32; neg nuc 07/2017.  . Glaucoma   . Hyperlipidemia    HYPERCHOLESTEROLEMIA  . Hypertension    MARKED LEFT VENTRICULAR HYPERTROPHY BY PREVIOUS ECHOCARDIOGRAM--HE HAS HYPERDYNAMIC LEFT VENTRICULAR SYSTOLIC FUNCTION AND HAS IMPAIRED RELAXATION BY ECHO  . Hypertrophic cardiomyopathy ([facility])   . NSVT (nonsustained ventricular tachycardia) ([place])   . Premature atrial contractions   . PVC's (premature ventricular contractions)   . SVT (supraventricular tachycardia) (HCC)     Past Surgical History:  Procedure Laterality Date  . NO PAST SURGERIES      Current Medications: Prior to Admission medications   Medication Sig Start Date End Date Taking? Authorizing Provider  amLODipine (NORVASC) 5 MG tablet Take 1 tablet (5 mg total) by mouth at bedtime. 11/26/19   Skeet Latch, MD  atorvastatin (LIPITOR) 20 MG tablet Take 1 tablet (20 mg total) by mouth daily. 03/16/18   Dorothy Spark, MD  brimonidine (ALPHAGAN) 0.2 % ophthalmic solution Place 1 drop into the right eye 2 (two) times daily. 10/06/19   [provider]  carvedilol (COREG) 25 MG tablet Take 2 tablets (50 mg total) by mouth 2 (  two) times daily. 02/21/20 03/22/20  Dessa Phi, DO  cholecalciferol (VITAMIN D3) 25 MCG (1000 UT) tablet Take 1,000 Units by mouth daily.    [provider]  cloNIDine (CATAPRES) 0.2 MG tablet Take 1 tablet (0.2 mg total) by mouth 3 (three) times daily. 02/21/20 03/22/20  Dessa Phi, DO  diclofenac Sodium (VOLTAREN) 1 % GEL Apply 4 g topically 4 (four) times daily as needed for pain. 02/07/20   [provider]  furosemide (LASIX) 20 MG tablet Take 2  tablets (40 mg total) by mouth daily. 01/16/20   Alma Friendly, MD  glucose blood (ONETOUCH VERIO) test strip Use as instructed to test blood sugar 3 times daily E11.65 03/12/19   Shamleffer, Melanie Crazier, MD  hydrALAZINE (APRESOLINE) 100 MG tablet Take 1 tablet (100 mg total) by mouth 2 (two) times daily. 09/14/19   Imogene Burn, PA-C  insulin aspart (NOVOLOG) 100 UNIT/ML injection Inject 0-6 Units into the skin 3 (three) times daily with meals. Sliding scale 09/02/19   [provider]  Insulin Glargine (TOUJEO MAX SOLOSTAR) 300 UNIT/ML SOPN Inject 18 Units into the skin at bedtime.    [provider]  Insulin Pen Needle (B-D UF III MINI PEN NEEDLES) 31G X 5 MM MISC Four times daily 06/30/18   Shamleffer, Melanie Crazier, MD  latanoprost (XALATAN) 0.005 % ophthalmic solution Place 1 drop into the left eye at bedtime. 10/06/19   [provider]  nitroGLYCERIN (NITROSTAT) 0.4 MG SL tablet Place 1 tablet (0.4 mg total) under the tongue every 5 (five) minutes as needed for chest pain. 03/29/16   Dorothy Spark, MD    Allergies:   Aspirin   Social History   Socioeconomic History  . Marital status: Legally Separated    Spouse name: Not on file  . Number of children: Not on file  . Years of education: Not on file  . Highest education level: Not on file  Occupational History  . Occupation: retired    Fish farm manager: [employer]    Comment: At [employer]  . Smoking status: Never Smoker  . Smokeless tobacco: Never Used  Vaping Use  . Vaping Use: Never used  Substance and Sexual Activity  . Alcohol use: No  . Drug use: No  . Sexual activity: Never  Other Topics Concern  . Not on file  Social History Narrative   Originally from [place].    Social Determinants of Health   Financial Resource Strain:   . Difficulty of Paying Living Expenses: Not on file  Food Insecurity:   . Worried About Charity fundraiser in the Last Year:  Not on file  . Ran Out of Food in the Last Year: Not on file  Transportation Needs:   . Lack of Transportation (Medical): Not on file  . Lack of Transportation (Non-Medical): Not on file  Physical Activity:   . Days of Exercise per Week: Not on file  . Minutes of Exercise per Session: Not on file  Stress:   . Feeling of Stress : Not on file  Social Connections:   . Frequency of Communication with Friends and Family: Not on file  . Frequency of Social Gatherings with Friends and Family: Not on file  . Attends Religious Services: Not on file  . Active Member of Clubs or Organizations: Not on file  . Attends Archivist Meetings: Not on file  . Marital Status: Not on file  Family History:  The patient's family history includes Diabetes in his brother and brother; Hypertension in his brother, father, and mother; Stroke in his father.   ROS:   Please see the history of present illness.    ROS All other systems reviewed and are negative.   PHYSICAL EXAM:   VS:  BP (!) 150/76   Pulse (!) 56   Ht 5\' 8"  (1.727 m)   Wt 182 lb (82.6 kg)   SpO2 91%   BMI 27.67 kg/m    GEN: Well nourished, well developed, in no acute distress  HEENT: normal  Neck: no JVD, carotid bruits, or masses Cardiac: RRR; no murmurs, rubs, or gallops, mild bilateral lower extremity edema  Respiratory:  clear to auscultation bilaterally, normal work of breathing GI: soft, nontender, nondistended, + BS MS: no deformity or atrophy  Skin: warm and dry, no rash Neuro:  Alert and Oriented x 3, Strength and sensation are intact Psych: euthymic mood, full affect  Wt Readings from Last 3 Encounters:  03/02/20 182 lb (82.6 kg)  02/21/20 175 lb 7.8 oz (79.6 kg)  02/11/20 185 lb 6.4 oz (84.1 kg)      Studies/Labs Reviewed:   EKG:  EKG is not ordered today.    Recent Labs: 01/12/2020: TSH 2.415 01/13/2020: Magnesium 1.8 02/18/2020: ALT 16; B Natriuretic Peptide 1,163.2 02/19/2020: Hemoglobin 10.1;  Platelets 167 02/21/2020: BUN 39; Creatinine, Ser 2.46; Potassium 4.2; Sodium 138   Lipid Panel    Component Value Date/Time   CHOL 157 11/07/2016 0922   TRIG 81 11/07/2016 0922   HDL 59 11/07/2016 0922   CHOLHDL 2.7 11/07/2016 0922   CHOLHDL 3 05/04/2014 0852   VLDL 13.6 05/04/2014 0852   LDLCALC 82 11/07/2016 0922    Additional studies/ records that were reviewed today include:   Limited Echocardiogram: 02/19/20 FINDINGS  Left Ventricle: Limited study with Valsalva does not show any significant  LVOT gradient to suggest obstruction.   Renal artery [place] 02/21/20: Renal:    Right: 1-59% stenosis of the right renal artery. Abnormal right     Resistive Index. Normal size right kidney.  Left: No evidence of left renal artery stenosis. Abnormal left     Resisitve Index. Normal size of left kidney.  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     ASSESSMENT & PLAN:    1. Hypertensive heart disease Blood pressure elevated but improving.  Will not increase amlodipine secondary to lower extremity edema.  No change in therapy today.  He will follow-up in hypertensive clinic with Dr. Oval Linsey.  2.  Chronic diastolic heart failure -Volume status stable -Continue Lasix at current dose  3.  Chronic kidney disease stage IV -Followed by Dr. Carolin Sicks  4.  Dyspnea on exertion -Patient has chronic dyspnea on exertion with mild chest discomfort.  Patient reports stable symptoms for months.? Symptoms due to angina.  Prior stress test in 2019 reassuring.  I have discussed repeat stress testing but patient wants to defer for now.  He will take sublingual nitroglycerin as needed.  Patient reports history of itching on aspirin for years ago.  He will try to retake and see response.  Continue Lipitor 20 mg.  Discussed plan with Dr. Meda Coffee over the phone.  5. OSA - Compliant with CPAP  Medication Adjustments/Labs and Tests Ordered: Current medicines are  reviewed at length with the patient today.  Concerns regarding medicines are outlined above.  Medication changes, Labs and Tests ordered today are  listed in the Patient Instructions below. Patient Instructions  Medication Instructions:  Your physician has recommended you make the following change in your medication:  1.  RESTART Aspirin 81 mg daily  *If you need a refill on your cardiac medications before your next appointment, please call your pharmacy*   Lab Work: None ordered  If you have labs (blood work) drawn today and your tests are completely normal, you will receive your results only by: Marland Kitchen MyChart Message (if you have MyChart) OR . A paper copy in the mail If you have any lab test that is abnormal or we need to change your treatment, we will call you to review the results.   Testing/Procedures: None ordered  Follow-Up: Your physician recommends that you schedule a follow-up appointment in:  [place] [address]. [place]    Follow-Up: At [facility], you and your health needs are our priority.  As part of our continuing mission to provide you with exceptional heart care, we have created designated Provider Care Teams.  These Care Teams include your primary Cardiologist (physician) and Advanced Practice Providers (APPs -  Physician Assistants and Nurse Practitioners) who all work together to provide you with the care you need, when you need it.  We recommend signing up for the patient portal called "MyChart".  Sign up information is provided on this After Visit Summary.  MyChart is used to connect with patients for Virtual Visits (Telemedicine).  Patients are able to view lab/test results, encounter notes, upcoming appointments, etc.  Non-urgent messages can be sent to your provider as well.   To learn more about what you can do with MyChart, go to [email or web address].    Your next appointment:   3 month(s)  The format for your next  appointment:   In Person  Provider:   Ena Dawley, MD   Other Instructions      Signed, Leanor Kail, PA  03/02/2020 11:49 AM    [facility] [address], [place], [place]  [address] Phone: [phone]; Fax: [phone]

## 2020-03-10 DIAGNOSIS — N1832 Chronic kidney disease, stage 3b: Secondary | ICD-10-CM | POA: Diagnosis not present

## 2020-03-10 DIAGNOSIS — I509 Heart failure, unspecified: Secondary | ICD-10-CM | POA: Diagnosis not present

## 2020-03-10 DIAGNOSIS — I10 Essential (primary) hypertension: Secondary | ICD-10-CM | POA: Diagnosis not present

## 2020-03-10 DIAGNOSIS — E1122 Type 2 diabetes mellitus with diabetic chronic kidney disease: Secondary | ICD-10-CM | POA: Diagnosis not present

## 2020-03-16 ENCOUNTER — Other Ambulatory Visit: Payer: Self-pay

## 2020-03-16 ENCOUNTER — Encounter: Payer: Self-pay | Admitting: Cardiovascular Disease

## 2020-03-16 ENCOUNTER — Ambulatory Visit: Payer: PPO | Admitting: Cardiovascular Disease

## 2020-03-16 VITALS — BP 180/80 | HR 69 | Ht 68.0 in | Wt 176.0 lb

## 2020-03-16 DIAGNOSIS — I110 Hypertensive heart disease with heart failure: Secondary | ICD-10-CM | POA: Diagnosis not present

## 2020-03-16 DIAGNOSIS — I422 Other hypertrophic cardiomyopathy: Secondary | ICD-10-CM

## 2020-03-16 DIAGNOSIS — I5032 Chronic diastolic (congestive) heart failure: Secondary | ICD-10-CM

## 2020-03-16 DIAGNOSIS — I10 Essential (primary) hypertension: Secondary | ICD-10-CM | POA: Diagnosis not present

## 2020-03-16 DIAGNOSIS — G4733 Obstructive sleep apnea (adult) (pediatric): Secondary | ICD-10-CM | POA: Diagnosis not present

## 2020-03-16 DIAGNOSIS — Z5181 Encounter for therapeutic drug level monitoring: Secondary | ICD-10-CM

## 2020-03-16 DIAGNOSIS — E7800 Pure hypercholesterolemia, unspecified: Secondary | ICD-10-CM

## 2020-03-16 MED ORDER — HYDRALAZINE HCL 100 MG PO TABS: 100.0000 mg | ORAL_TABLET | Freq: Three times a day (TID) | ORAL | 3 refills | Status: AC

## 2020-03-16 MED ORDER — FUROSEMIDE 80 MG PO TABS
80.0000 mg | ORAL_TABLET | Freq: Every day | ORAL | 3 refills | Status: DC
Start: 2020-03-16 — End: 2020-05-13

## 2020-03-16 NOTE — Patient Instructions (Addendum)
Medication Instructions:  INCREASE YOUR FUROSEMIDE TO 80 MG DAILY   INCREASE YOUR  HYDRALAZINE TO 100 MG THREE TIMES A DAY    Labwork: BMET IN 1 WEEK    Testing/Procedures: NONE   Follow-Up: 1 MONTH WITH PHARM D 04/21/2020 AT 10:30 AM    Special Instructions:   LIMIT YOUR FLUID TO 2 LITERS A DAY

## 2020-03-16 NOTE — Progress Notes (Signed)
Hypertension Clinic Follow up Appointment:    Date:  03/16/2020   ID:  Robert Chaney, DOB 04/02/1951, MRN [id number]  PCP:  [name], MD  Cardiologist:  Ena Dawley, MD  Nephrologist:  Referring MD: [name], MD   CC: Hypertension  History of Present Illness:    Robert Chaney is a 69 y.o. male with a hx of hypertrophic cardiomyopathy, hypertension, hyperlipidemia, chronic diastolic heart failure, CKD 4, diabetes, and OSA on CPAP here to establish care in the hypertension clinic.  He reports first being diagnosed with hypertension approximately 20 years ago when he moved to the [place] from [place].  Since then he does not think his blood pressure has never been very well have been controlled.  He had an echo 01/2019 that revealed severe LVH with a septal thickness of 2.1 cm.  There is no evidence of a significant outflow tract gradient or SAM on his echo 01/2019.  He has been working with Estella Husk, PA-C.  At his appointment 08/2019 his blood pressure was elevated but he felt that it was improved at home.  He had previously been on higher doses of hydralazine but developed dizziness.  He was started on Lasix due to lower extremity edema but developed some acute renal insufficiency so it was reduced to every other day.  He followed up with Ms. Bonnell Public on 09/14/2019 and his blood pressure remained elevated.  Hydralazine was increased to 100 mg and he was referred to the advanced hypertension clinic for further management.  Mr. Bridgewater notes that he feels generally well.  He does note that he gets short of breath when he tries to exercise.  He walks 3 days/week for approximately a mile.  Since stopping furosemide has had increased lower extremity edema.  He denies orthopnea or PND.  He uses his CPAP regularly.  He denies any over-the-counter supplements or cold and cough medications.  He notes that he drinks a lot of coffee.  He has several cups throughout the day.  He prepares his meals at  home and does not add any salt.  He takes his morning medications around 8 AM in the evening ones at bedtime.  His blood pressure at home has been running in the 160s on average.  At his initial hypertension visit amlodipine and carvedilol were restarted. He ws also started on lasix. He was admitted 01/2020 with hypertensive emergency requiring BiPAP and a nitrogycerin drip.  Carvedilol was increased as was clonidine.  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  Plasma normetanephrines were mildly elevated and renin/aldo were normal.  He followed up with Vin Bhagat 9/9 and BP was iproving to 140s/90s at home but was 150/94 at that visit.  Lately his blood pressure has been mostly in the 825K to 539J systolic.  He notes that he was too short of breath to exercise.  He also has orthopnea and PND.  He has had some swelling bilaterally that seems to get a little bit better lately.  He does get good urine output when he takes his Lasix.  He struggles with his CPAP machine.  They keep sending him replacement parts that do not fit well and he has a lot of air leakage.  He continues to try and use it every night faithfully.  He tries to limit his sodium intake and mostly cooks at home or has people bring him low salt foods.  He was rushing  to make it to his appointment today and did not take any medications yet.   Previous antihypertensives: Amlodipine Carvedilol Hydralazine Imdur losartan   Past Medical History:  Diagnosis Date  . Altered mental status    a. 05/2017 - adm with blurred vision, somnolence in setting of AKI and high blood sugar.  . Anemia   . CKD (chronic kidney disease), stage III   . Diabetes mellitus   . Diastolic CHF, chronic (HCC)    A.  03/2009 Echo: EF 60-65%, Gr II diast dysfxn  . Elevated troponin    a. 2013 - troponin 1.4. b. 2019 - troponin 0.32; neg nuc 07/2017.  . Glaucoma   . Hyperlipidemia    HYPERCHOLESTEROLEMIA  .  Hypertension    MARKED LEFT VENTRICULAR HYPERTROPHY BY PREVIOUS ECHOCARDIOGRAM--HE HAS HYPERDYNAMIC LEFT VENTRICULAR SYSTOLIC FUNCTION AND HAS IMPAIRED RELAXATION BY ECHO  . Hypertrophic cardiomyopathy ([place])   . NSVT (nonsustained ventricular tachycardia) ([place])   . Premature atrial contractions   . PVC's (premature ventricular contractions)   . SVT (supraventricular tachycardia) (HCC)     Past Surgical History:  Procedure Laterality Date  . NO PAST SURGERIES      Current Medications: Current Meds  Medication Sig  . amLODipine (NORVASC) 5 MG tablet Take 1 tablet (5 mg total) by mouth at bedtime.  Marland Kitchen aspirin EC 81 MG tablet Take 1 tablet (81 mg total) by mouth daily. Swallow whole.  Marland Kitchen atorvastatin (LIPITOR) 20 MG tablet Take 1 tablet (20 mg total) by mouth daily.  . brimonidine (ALPHAGAN) 0.2 % ophthalmic solution Place 1 drop into the right eye 2 (two) times daily.  . carvedilol (COREG) 25 MG tablet Take 2 tablets (50 mg total) by mouth 2 (two) times daily.  . cholecalciferol (VITAMIN D3) 25 MCG (1000 UT) tablet Take 1,000 Units by mouth daily.  . cloNIDine (CATAPRES) 0.2 MG tablet Take 1 tablet (0.2 mg total) by mouth 3 (three) times daily.  . diclofenac Sodium (VOLTAREN) 1 % GEL Apply 4 g topically 4 (four) times daily as needed for pain.  . furosemide (LASIX) 80 MG tablet Take 1 tablet (80 mg total) by mouth daily.  Marland Kitchen glucose blood (ONETOUCH VERIO) test strip Use as instructed to test blood sugar 3 times daily E11.65  . hydrALAZINE (APRESOLINE) 100 MG tablet Take 1 tablet (100 mg total) by mouth 3 (three) times daily.  . insulin aspart (NOVOLOG) 100 UNIT/ML injection Inject 0-6 Units into the skin 3 (three) times daily with meals. Sliding scale  . Insulin Glargine (TOUJEO MAX SOLOSTAR) 300 UNIT/ML SOPN Inject 18 Units into the skin at bedtime.  . Insulin Pen Needle (B-D UF III MINI PEN NEEDLES) 31G X 5 MM MISC Four times daily  . latanoprost (XALATAN) 0.005 % ophthalmic solution Place  1 drop into the left eye at bedtime.  . nitroGLYCERIN (NITROSTAT) 0.4 MG SL tablet Place 1 tablet (0.4 mg total) under the tongue every 5 (five) minutes as needed for chest pain.  . [DISCONTINUED] furosemide (LASIX) 20 MG tablet Take 2 tablets (40 mg total) by mouth daily.  . [DISCONTINUED] hydrALAZINE (APRESOLINE) 100 MG tablet Take 1 tablet (100 mg total) by mouth 2 (two) times daily.     Allergies:   Aspirin   Social History   Socioeconomic History  . Marital status: Legally Separated    Spouse name: Not on file  . Number of children: Not on file  . Years of education: Not on file  . Highest education level: Not  on file  Occupational History  . Occupation: retired    Fish farm manager: [employer]    Comment: At [employer]  . Smoking status: Never Smoker  . Smokeless tobacco: Never Used  Vaping Use  . Vaping Use: Never used  Substance and Sexual Activity  . Alcohol use: No  . Drug use: No  . Sexual activity: Never  Other Topics Concern  . Not on file  Social History Narrative   Originally from [place].    Social Determinants of Health   Financial Resource Strain:   . Difficulty of Paying Living Expenses: Not on file  Food Insecurity:   . Worried About Charity fundraiser in the Last Year: Not on file  . Ran Out of Food in the Last Year: Not on file  Transportation Needs:   . Lack of Transportation (Medical): Not on file  . Lack of Transportation (Non-Medical): Not on file  Physical Activity:   . Days of Exercise per Week: Not on file  . Minutes of Exercise per Session: Not on file  Stress:   . Feeling of Stress : Not on file  Social Connections:   . Frequency of Communication with Friends and Family: Not on file  . Frequency of Social Gatherings with Friends and Family: Not on file  . Attends Religious Services: Not on file  . Active Member of Clubs or Organizations: Not on file  . Attends Archivist Meetings: Not on file  .  Marital Status: Not on file     Family History: The patient's family history includes Diabetes in his brother and brother; Hypertension in his brother, father, and mother; Stroke in his father.  ROS:   Please see the history of present illness.     All other systems reviewed and are negative.  EKGs/Labs/Other Studies Reviewed:    EKG:  EKG is ordered today.  The ekg ordered today demonstrates sinus rhythm.  Rate 69 bpm.  LVH with secondary repolarization abnormalities.  Recent Labs: 01/12/2020: TSH 2.415 01/13/2020: Magnesium 1.8 02/18/2020: ALT 16; B Natriuretic Peptide 1,163.2 02/19/2020: Hemoglobin 10.1; Platelets 167 02/21/2020: BUN 39; Creatinine, Ser 2.46; Potassium 4.2; Sodium 138   Recent Lipid Panel    Component Value Date/Time   CHOL 157 11/07/2016 0922   TRIG 81 11/07/2016 0922   HDL 59 11/07/2016 0922   CHOLHDL 2.7 11/07/2016 0922   CHOLHDL 3 05/04/2014 0852   VLDL 13.6 05/04/2014 0852   LDLCALC 82 11/07/2016 0922    Physical Exam:    VS:  BP (!) 180/80   Pulse 69   Ht 5\' 8"  (1.727 m)   Wt 176 lb (79.8 kg)   SpO2 92%   BMI 26.76 kg/m  , BMI Body mass index is 26.76 kg/m. GENERAL:  Well appearing HEENT: Pupils equal round and reactive, fundi not visualized, oral mucosa unremarkable NECK:  + jugular venous distention to mid neck sitting upright, waveform within normal limits, carotid upstroke brisk and symmetric, no bruits LUNGS:  Clear to auscultation bilaterally HEART:  RRR.  PMI not displaced or sustained,S1 and S2 within normal limits, no S3, no S4, no clicks, no rubs, III/VI systolic murmur ABD:  Flat, positive bowel sounds normal in frequency in pitch, no bruits, no rebound, no guarding, no midline pulsatile mass, no hepatomegaly, no splenomegaly EXT:  2 plus pulses throughout, 1+ LE edema to the upper tibia bilaterally, no cyanosis no clubbing SKIN:  No rashes no nodules NEURO:  Cranial nerves  II through XII grossly intact, motor grossly intact  throughout [facility]:  Cognitively intact, oriented to person place and time   ASSESSMENT:    1. Essential hypertension   2. Therapeutic drug monitoring   3. Diastolic CHF, chronic ([place])   4. Hypertensive heart disease with congestive heart failure, unspecified heart failure type ([place])   5. Hypertrophic cardiomyopathy ([place])   6. OSA (obstructive sleep apnea)   7. Hypercholesterolemia     PLAN:    # Essential hypertension: # Acute on chronic diastolic heart failure: # CKD IV:   Blood pressure remains poorly controlled.  He had an inpatient work-up that was negative for hyperaldosteronism and only had mild renal artery stenosis.  He is taking his medications as prescribed.  He has a CPAP machine but it is not working effectively due to the parts not fitting.  We will ask our sleep study team to reach out to him and figure out the problem.  He is having a lot of leakage around his mask.  For now, continue amlodipine, carvedilol, clonidine.  Increase hydralazine to 100 mg 3 times daily.  He is also volume overloaded which I think is causing his shortness of breath.  Increase Lasix to 80 mg and have him check a basic metabolic panel in a week.  He is not on an ARB due to worsening renal function and GFR 29.  He missed his appointment with nephrology while in the hospital and will need to reschedule.  Once his breathing improves he would really benefit from the prep exercise and nutrition program through the [facility].   Secondary Causes of Hypertension  Medications/Herbal: OCP, steroids, stimulants, antidepressants, weight loss medication, immune suppressants, NSAIDs, sympathomimetics, alcohol, caffeine, licorice, ginseng, St. John's wort, chemo  Sleep Apnea: Continue CPAP Renal artery stenosis: Mild R 01/2020 Hyperaldosteronism:: Negative 01/2020 Hyper/hypothyroidism: TSH normal 12/2019 Pheochromocytoma: Mildly positive serum normetanephrines 01/2020 Cushing's syndrome: Cushingoid facies, central  obesity, proximal muscle weakness, and ecchymoses, adrenal incidentaloma (cortisol) Coarctation of the aorta: (testing not indicated)   # OSA:  Continue CPAP.  Disposition:    FU with MD/PharmD in 1 month    Medication Adjustments/Labs and Tests Ordered: Current medicines are reviewed at length with the patient today.  Concerns regarding medicines are outlined above.  Orders Placed This Encounter  Procedures  . Basic metabolic panel   Meds ordered this encounter  Medications  . hydrALAZINE (APRESOLINE) 100 MG tablet    Sig: Take 1 tablet (100 mg total) by mouth 3 (three) times daily.    Dispense:  270 tablet    Refill:  3    Dose increased.  . furosemide (LASIX) 80 MG tablet    Sig: Take 1 tablet (80 mg total) by mouth daily.    Dispense:  90 tablet    Refill:  3    NEW DOSE, D/C PREVIOUS RX     Signed, Skeet Latch, MD  03/16/2020 11:44 AM    [place]

## 2020-03-22 ENCOUNTER — Telehealth: Payer: Self-pay | Admitting: *Deleted

## 2020-03-22 DIAGNOSIS — I5032 Chronic diastolic (congestive) heart failure: Secondary | ICD-10-CM | POA: Diagnosis not present

## 2020-03-22 DIAGNOSIS — G4733 Obstructive sleep apnea (adult) (pediatric): Secondary | ICD-10-CM | POA: Diagnosis not present

## 2020-03-22 NOTE — Telephone Encounter (Signed)
Per Dr Blenda Mounts request I reached out to the patient to see if I can assist with the problems he is having with his CPAP machine. After multiple attempts I kept getting a message the number is no longer in service.

## 2020-03-25 ENCOUNTER — Other Ambulatory Visit: Payer: Self-pay | Admitting: Cardiology

## 2020-04-05 DIAGNOSIS — H34831 Tributary (branch) retinal vein occlusion, right eye, with macular edema: Secondary | ICD-10-CM | POA: Diagnosis not present

## 2020-04-05 DIAGNOSIS — H401112 Primary open-angle glaucoma, right eye, moderate stage: Secondary | ICD-10-CM | POA: Diagnosis not present

## 2020-04-05 DIAGNOSIS — Z961 Presence of intraocular lens: Secondary | ICD-10-CM | POA: Diagnosis not present

## 2020-04-05 DIAGNOSIS — H401123 Primary open-angle glaucoma, left eye, severe stage: Secondary | ICD-10-CM | POA: Diagnosis not present

## 2020-04-05 DIAGNOSIS — H21511 Anterior synechiae (iris), right eye: Secondary | ICD-10-CM | POA: Diagnosis not present

## 2020-04-13 ENCOUNTER — Ambulatory Visit: Payer: PPO | Admitting: Dietician

## 2020-04-15 DIAGNOSIS — I5032 Chronic diastolic (congestive) heart failure: Secondary | ICD-10-CM | POA: Diagnosis not present

## 2020-04-15 DIAGNOSIS — G4733 Obstructive sleep apnea (adult) (pediatric): Secondary | ICD-10-CM | POA: Diagnosis not present

## 2020-04-21 ENCOUNTER — Ambulatory Visit: Payer: PPO

## 2020-04-21 DIAGNOSIS — G4733 Obstructive sleep apnea (adult) (pediatric): Secondary | ICD-10-CM | POA: Diagnosis not present

## 2020-04-21 DIAGNOSIS — I5032 Chronic diastolic (congestive) heart failure: Secondary | ICD-10-CM | POA: Diagnosis not present

## 2020-04-23 ENCOUNTER — Other Ambulatory Visit: Payer: Self-pay | Admitting: Cardiovascular Disease

## 2020-04-27 ENCOUNTER — Encounter: Payer: PPO | Attending: Internal Medicine | Admitting: Dietician

## 2020-05-05 DIAGNOSIS — J9611 Chronic respiratory failure with hypoxia: Secondary | ICD-10-CM | POA: Diagnosis not present

## 2020-05-05 DIAGNOSIS — I509 Heart failure, unspecified: Secondary | ICD-10-CM | POA: Diagnosis not present

## 2020-05-05 DIAGNOSIS — E1122 Type 2 diabetes mellitus with diabetic chronic kidney disease: Secondary | ICD-10-CM | POA: Diagnosis not present

## 2020-05-05 DIAGNOSIS — N1832 Chronic kidney disease, stage 3b: Secondary | ICD-10-CM | POA: Diagnosis not present

## 2020-05-05 DIAGNOSIS — Z23 Encounter for immunization: Secondary | ICD-10-CM | POA: Diagnosis not present

## 2020-05-05 DIAGNOSIS — I10 Essential (primary) hypertension: Secondary | ICD-10-CM | POA: Diagnosis not present

## 2020-05-09 ENCOUNTER — Other Ambulatory Visit: Payer: Self-pay

## 2020-05-09 ENCOUNTER — Ambulatory Visit (INDEPENDENT_AMBULATORY_CARE_PROVIDER_SITE_OTHER): Payer: PPO | Admitting: Pharmacist Clinician (PhC)/ Clinical Pharmacy Specialist

## 2020-05-09 VITALS — BP 164/78 | HR 63 | Resp 17 | Ht 68.0 in | Wt 179.6 lb

## 2020-05-09 DIAGNOSIS — I10 Essential (primary) hypertension: Secondary | ICD-10-CM | POA: Diagnosis not present

## 2020-05-09 DIAGNOSIS — Z5181 Encounter for therapeutic drug level monitoring: Secondary | ICD-10-CM | POA: Diagnosis not present

## 2020-05-09 LAB — BASIC METABOLIC PANEL
BUN/Creatinine Ratio: 16 (ref 10–24)
BUN: 41 mg/dL — ABNORMAL HIGH (ref 8–27)
CO2: 25 mmol/L (ref 20–29)
Calcium: 8.9 mg/dL (ref 8.6–10.2)
Chloride: 106 mmol/L (ref 96–106)
Creatinine, Ser: 2.54 mg/dL — ABNORMAL HIGH (ref 0.76–1.27)
GFR calc Af Amer: 28 mL/min/{1.73_m2} — ABNORMAL LOW (ref >59)
GFR calc non Af Amer: 24 mL/min/{1.73_m2} — ABNORMAL LOW (ref >59)
Glucose: 178 mg/dL — ABNORMAL HIGH (ref 65–99)
Potassium: 5 mmol/L (ref 3.5–5.2)
Sodium: 138 mmol/L (ref 134–144)

## 2020-05-09 MED ORDER — DOXAZOSIN MESYLATE 2 MG PO TABS: 2.0000 mg | ORAL_TABLET | Freq: Every day | ORAL | 6 refills | Status: DC

## 2020-05-09 NOTE — Patient Instructions (Signed)
Return for a a follow up appointment December 23 at 10:30 am  Go to the lab today to check kidney function  Check your blood pressure at home daily (if able) and keep record of the readings.  Take your BP meds as follows:  Start doxazosin 2 mg each night at bedtime    Continue with all other medications   Would consider adding chlorthalidone, but patient has follow up with nephrology and will leave that to their discretion.   Bring all of your meds, your BP cuff and your record of home blood pressures to your next appointment.  Exercise as you're able, try to walk approximately 30 minutes per day.  Keep salt intake to a minimum, especially watch canned and prepared boxed foods.  Eat more fresh fruits and vegetables and fewer canned items.  Avoid eating in fast food restaurants.    HOW TO TAKE YOUR BLOOD PRESSURE: . Rest 5 minutes before taking your blood pressure. .  Don't smoke or drink caffeinated beverages for at least 30 minutes before. . Take your blood pressure before (not after) you eat. . Sit comfortably with your back supported and both feet on the floor (don't cross your legs). . Elevate your arm to heart level on a table or a desk. . Use the proper sized cuff. It should fit smoothly and snugly around your bare upper arm. There should be enough room to slip a fingertip under the cuff. The bottom edge of the cuff should be 1 inch above the crease of the elbow. . Ideally, take 3 measurements at one sitting and record the average.

## 2020-05-09 NOTE — Progress Notes (Signed)
HPI: Robert Chaney is a 69 yo male with a PMH below who presents today for hypertension clinic evaluation. He saw Dr. Oval Linsey on 9/23 to establish care in the hypertension clinic. At that time, his BP had mostly been in the 341D to 622W systolic at home. Hydralazine was increased to 100 mg TID, and Furosemide was increased to 80 mg daily due to worsening shortness of breath and fluid overload. BP today is 164/78 mmHg. He has still been having some lower extremity edema that is worse in his right leg. Patient says he is tired all the time and often falls asleep throughout the day.  He is supposed to be on aspirin 81 mg daily but has been taking 4 tablets because it is what the instructions on the OTC box says and he was confused as to why he needed that many. Instructed the patient that only 1 tablet daily is needed for heart protection.   PMH: Hyperlipidemia Last lipid panel 2018 (LDL 82 at the time)- on atorvastatin 20 mg  T2DM 12/2019 A1c 6.0- on toujeo max solostar 18 units qhs, novolog 0-6 units tid sliding scale with meals  Chronic diastolic HF 02/7988 EF 21-19%- fluid overload on Furosemide 80 mg qd  Obstructive sleep apnea On CPAP- has been having trouble with his machine  CKD stage 4 02/21/20 eGFR 29    BP goal: <130/80 mmHg  Vitals . 05/09/20: 164/78 mmHg, pulse 63 bpm . 03/16/20: 180/80 mmHg, pulse 69 bpm. Did not take his BP medications prior to appt . 03/02/20: 150/76 mmHg, pulse 56 bpm  Current HTN Medications: amlodipine 5 mg qhs, carvedilol 50 mg bid, clonidine 0.2 mg TID, hydralazine 100 mg TID, furosemide 80 mg qd  Family Hx: brother had stroke 2 weeks ago, both parents had HTN and are deceased (mom was 68 yo, dad was in his 25s)  Social Hx: negative for smoking and alcohol use  Diet: He has someone cook for him and brings him meals at home with no added salt. Drinks coffee at least 3 times a day Physical activity: tries to go on walks, but he is on 4 L of O2 at home and often gets short of  breath which limits his ability to do physical activity  Home BP Readings: Time of measurement varies. Morning measurements are sometimes after coffee, and evening readings are between dinner and bedtime. He has been taking his BP standing up. . Avg morning: 162/63 mmHg (120-185/51-69) . Avg evening: 158/59 mmHg (120-175/45-64)  Labs: 8/30 Na 138, K 4.2, BUN 39, SCr 2.46, eGFR 29. Will draw labs today to check renal function and electrolytes since increasing furosemide dose  Assessment/Plan: BP is still significantly elevated above goal of 130/80 mmHg. His carvedilol and hydralazine are at max dose. Would not increase his amlodipine due to concern for peripheral edema and the patient is already having lower extremity swelling. He is constantly tired and falls asleep during the day so would not increase clonidine. Given his current kidney function (eGFR 29, SCr 2.46), he is not a candidate for an ACEi or ARB. Also want to avoid adding additional diuretics at this time but can potentially consider adding a low dose of chlorthalidone in the future if approved by patient's nephrologist. He is going to ask his nephrologist at his appointment in December.   Add doxazosin 2 mg at bedtime for further BP lowering. Continue all other medications as previously prescribed. Patient is going to start taking his BP sitting down instead of standing  and keep a log of his readings at home. Follow up with HTN clinic in 1 month.   Angelique Oakley, PharmD Candidate [employer] 2022  I was present for entire office visit and agree with assessment and plan.  Kristin Alvstad PharmD CPP [facility]  

## 2020-05-11 ENCOUNTER — Encounter (HOSPITAL_COMMUNITY): Payer: Self-pay | Admitting: Family Medicine

## 2020-05-11 ENCOUNTER — Inpatient Hospital Stay (HOSPITAL_COMMUNITY)
Admission: EM | Admit: 2020-05-11 | Discharge: 2020-05-13 | DRG: 291 | Disposition: A | Discharge status: Home / Self Care | Payer: PPO | Attending: Family Medicine | Admitting: Family Medicine

## 2020-05-11 ENCOUNTER — Emergency Department (HOSPITAL_COMMUNITY): Payer: PPO

## 2020-05-11 ENCOUNTER — Other Ambulatory Visit: Payer: Self-pay

## 2020-05-11 DIAGNOSIS — D539 Nutritional anemia, unspecified: Secondary | ICD-10-CM | POA: Diagnosis not present

## 2020-05-11 DIAGNOSIS — Z794 Long term (current) use of insulin: Secondary | ICD-10-CM

## 2020-05-11 DIAGNOSIS — R0689 Other abnormalities of breathing: Secondary | ICD-10-CM | POA: Diagnosis not present

## 2020-05-11 DIAGNOSIS — J810 Acute pulmonary edema: Secondary | ICD-10-CM

## 2020-05-11 DIAGNOSIS — I248 Other forms of acute ischemic heart disease: Secondary | ICD-10-CM | POA: Diagnosis not present

## 2020-05-11 DIAGNOSIS — E1142 Type 2 diabetes mellitus with diabetic polyneuropathy: Secondary | ICD-10-CM | POA: Diagnosis not present

## 2020-05-11 DIAGNOSIS — I472 Ventricular tachycardia: Secondary | ICD-10-CM | POA: Diagnosis present

## 2020-05-11 DIAGNOSIS — J9621 Acute and chronic respiratory failure with hypoxia: Secondary | ICD-10-CM | POA: Diagnosis not present

## 2020-05-11 DIAGNOSIS — N179 Acute kidney failure, unspecified: Secondary | ICD-10-CM

## 2020-05-11 DIAGNOSIS — Z7982 Long term (current) use of aspirin: Secondary | ICD-10-CM | POA: Diagnosis not present

## 2020-05-11 DIAGNOSIS — Z20822 Contact with and (suspected) exposure to covid-19: Secondary | ICD-10-CM | POA: Diagnosis not present

## 2020-05-11 DIAGNOSIS — Z886 Allergy status to analgesic agent status: Secondary | ICD-10-CM

## 2020-05-11 DIAGNOSIS — N184 Chronic kidney disease, stage 4 (severe): Secondary | ICD-10-CM | POA: Diagnosis not present

## 2020-05-11 DIAGNOSIS — I5033 Acute on chronic diastolic (congestive) heart failure: Secondary | ICD-10-CM

## 2020-05-11 DIAGNOSIS — I130 Hypertensive heart and chronic kidney disease with heart failure and stage 1 through stage 4 chronic kidney disease, or unspecified chronic kidney disease: Principal | ICD-10-CM | POA: Diagnosis present

## 2020-05-11 DIAGNOSIS — G4733 Obstructive sleep apnea (adult) (pediatric): Secondary | ICD-10-CM | POA: Diagnosis present

## 2020-05-11 DIAGNOSIS — J189 Pneumonia, unspecified organism: Secondary | ICD-10-CM

## 2020-05-11 DIAGNOSIS — J9601 Acute respiratory failure with hypoxia: Secondary | ICD-10-CM | POA: Diagnosis not present

## 2020-05-11 DIAGNOSIS — I160 Hypertensive urgency: Secondary | ICD-10-CM

## 2020-05-11 DIAGNOSIS — I471 Supraventricular tachycardia: Secondary | ICD-10-CM | POA: Diagnosis present

## 2020-05-11 DIAGNOSIS — Z9119 Patient's noncompliance with other medical treatment and regimen: Secondary | ICD-10-CM

## 2020-05-11 DIAGNOSIS — Z8249 Family history of ischemic heart disease and other diseases of the circulatory system: Secondary | ICD-10-CM | POA: Diagnosis not present

## 2020-05-11 DIAGNOSIS — Z79899 Other long term (current) drug therapy: Secondary | ICD-10-CM | POA: Diagnosis not present

## 2020-05-11 DIAGNOSIS — E1122 Type 2 diabetes mellitus with diabetic chronic kidney disease: Secondary | ICD-10-CM | POA: Diagnosis present

## 2020-05-11 DIAGNOSIS — R0902 Hypoxemia: Secondary | ICD-10-CM | POA: Diagnosis not present

## 2020-05-11 DIAGNOSIS — I421 Obstructive hypertrophic cardiomyopathy: Secondary | ICD-10-CM | POA: Diagnosis not present

## 2020-05-11 DIAGNOSIS — I169 Hypertensive crisis, unspecified: Secondary | ICD-10-CM | POA: Diagnosis present

## 2020-05-11 DIAGNOSIS — E785 Hyperlipidemia, unspecified: Secondary | ICD-10-CM | POA: Diagnosis present

## 2020-05-11 DIAGNOSIS — R778 Other specified abnormalities of plasma proteins: Secondary | ICD-10-CM | POA: Diagnosis not present

## 2020-05-11 DIAGNOSIS — R0602 Shortness of breath: Secondary | ICD-10-CM | POA: Diagnosis not present

## 2020-05-11 DIAGNOSIS — I493 Ventricular premature depolarization: Secondary | ICD-10-CM | POA: Diagnosis not present

## 2020-05-11 DIAGNOSIS — I10 Essential (primary) hypertension: Secondary | ICD-10-CM | POA: Diagnosis not present

## 2020-05-11 DIAGNOSIS — Z833 Family history of diabetes mellitus: Secondary | ICD-10-CM

## 2020-05-11 DIAGNOSIS — I517 Cardiomegaly: Secondary | ICD-10-CM | POA: Diagnosis not present

## 2020-05-11 LAB — CBC WITH DIFFERENTIAL/PLATELET
Abs Immature Granulocytes: 0.03 10*3/uL (ref 0.00–0.07)
Basophils Absolute: 0.1 10*3/uL (ref 0.0–0.1)
Basophils Relative: 1 %
Eosinophils Absolute: 0.1 10*3/uL (ref 0.0–0.5)
Eosinophils Relative: 1 %
HCT: 36.5 % — ABNORMAL LOW (ref 39.0–52.0)
Hemoglobin: 11 g/dL — ABNORMAL LOW (ref 13.0–17.0)
Immature Granulocytes: 0 %
Lymphocytes Relative: 16 %
Lymphs Abs: 1.5 10*3/uL (ref 0.7–4.0)
MCH: 31.2 pg (ref 26.0–34.0)
MCHC: 30.1 g/dL (ref 30.0–36.0)
MCV: 103.4 fL — ABNORMAL HIGH (ref 80.0–100.0)
Monocytes Absolute: 0.7 10*3/uL (ref 0.1–1.0)
Monocytes Relative: 8 %
Neutro Abs: 6.7 10*3/uL (ref 1.7–7.7)
Neutrophils Relative %: 74 %
Platelets: 247 10*3/uL (ref 150–400)
RBC: 3.53 MIL/uL — ABNORMAL LOW (ref 4.22–5.81)
RDW: 14.1 % (ref 11.5–15.5)
WBC: 9.1 10*3/uL (ref 4.0–10.5)
nRBC: 0 % (ref 0.0–0.2)

## 2020-05-11 LAB — BASIC METABOLIC PANEL
Anion gap: 10 (ref 5–15)
BUN: 44 mg/dL — ABNORMAL HIGH (ref 8–23)
CO2: 25 mmol/L (ref 22–32)
Calcium: 8.9 mg/dL (ref 8.9–10.3)
Chloride: 104 mmol/L (ref 98–111)
Creatinine, Ser: 2.86 mg/dL — ABNORMAL HIGH (ref 0.61–1.24)
GFR, Estimated: 23 mL/min — ABNORMAL LOW (ref >60)
Glucose, Bld: 197 mg/dL — ABNORMAL HIGH (ref 70–99)
Potassium: 4.7 mmol/L (ref 3.5–5.1)
Sodium: 139 mmol/L (ref 135–145)

## 2020-05-11 LAB — PROCALCITONIN: Procalcitonin: 0.2 ng/mL

## 2020-05-11 LAB — RESPIRATORY PANEL BY RT PCR (FLU A&B, COVID)
Influenza A by PCR: NEGATIVE
Influenza B by PCR: NEGATIVE
SARS Coronavirus 2 by RT PCR: NEGATIVE

## 2020-05-11 LAB — TROPONIN I (HIGH SENSITIVITY)
Troponin I (High Sensitivity): 72 ng/L — ABNORMAL HIGH (ref <18)
Troponin I (High Sensitivity): 66 ng/L — ABNORMAL HIGH (ref <18)

## 2020-05-11 LAB — GLUCOSE, CAPILLARY
Glucose-Capillary: 120 mg/dL — ABNORMAL HIGH (ref 70–99)
Glucose-Capillary: 152 mg/dL — ABNORMAL HIGH (ref 70–99)

## 2020-05-11 LAB — CBG MONITORING, ED
Glucose-Capillary: 143 mg/dL — ABNORMAL HIGH (ref 70–99)
Glucose-Capillary: 130 mg/dL — ABNORMAL HIGH (ref 70–99)
Glucose-Capillary: 123 mg/dL — ABNORMAL HIGH (ref 70–99)

## 2020-05-11 LAB — BRAIN NATRIURETIC PEPTIDE: B Natriuretic Peptide: 817.1 pg/mL — ABNORMAL HIGH (ref 0.0–100.0)

## 2020-05-11 MED ORDER — HYDRALAZINE HCL 50 MG PO TABS
100.0000 mg | ORAL_TABLET | Freq: Three times a day (TID) | ORAL | Status: DC
  Administered 2020-05-11 – 2020-05-13 (×6): 100 mg via ORAL
  Filled 2020-05-11 (×6): qty 2

## 2020-05-11 MED ORDER — SODIUM CHLORIDE 0.9 % IV SOLN: 250.0000 mL | INTRAVENOUS | Status: DC | PRN

## 2020-05-11 MED ORDER — ACETAMINOPHEN 325 MG PO TABS
650.0000 mg | ORAL_TABLET | ORAL | Status: DC | PRN
  Administered 2020-05-12: 650 mg via ORAL
  Filled 2020-05-11: qty 2

## 2020-05-11 MED ORDER — INSULIN GLARGINE 100 UNIT/ML ~~LOC~~ SOLN
9.0000 [IU] | Freq: Every day | SUBCUTANEOUS | Status: DC
  Administered 2020-05-11 – 2020-05-12 (×2): 9 [IU] via SUBCUTANEOUS
  Filled 2020-05-11 (×3): qty 0.09

## 2020-05-11 MED ORDER — INSULIN ASPART 100 UNIT/ML ~~LOC~~ SOLN: 0.0000 [IU] | Freq: Every day | SUBCUTANEOUS | Status: DC

## 2020-05-11 MED ORDER — ONDANSETRON HCL 4 MG/2ML IJ SOLN: 4.0000 mg | Freq: Four times a day (QID) | INTRAMUSCULAR | Status: DC | PRN

## 2020-05-11 MED ORDER — INSULIN ASPART 100 UNIT/ML ~~LOC~~ SOLN
0.0000 [IU] | Freq: Three times a day (TID) | SUBCUTANEOUS | Status: DC
  Administered 2020-05-11: 1 [IU] via SUBCUTANEOUS
  Administered 2020-05-12 (×2): 2 [IU] via SUBCUTANEOUS
  Administered 2020-05-12 – 2020-05-13 (×2): 1 [IU] via SUBCUTANEOUS

## 2020-05-11 MED ORDER — SODIUM CHLORIDE 0.9 % IV SOLN
500.0000 mg | Freq: Once | INTRAVENOUS | Status: AC
  Administered 2020-05-11: 500 mg via INTRAVENOUS
  Filled 2020-05-11: qty 500

## 2020-05-11 MED ORDER — HEPARIN SODIUM (PORCINE) 5000 UNIT/ML IJ SOLN
5000.0000 [IU] | Freq: Three times a day (TID) | INTRAMUSCULAR | Status: DC
  Administered 2020-05-11 – 2020-05-13 (×5): 5000 [IU] via SUBCUTANEOUS
  Filled 2020-05-11 (×5): qty 1

## 2020-05-11 MED ORDER — SODIUM CHLORIDE 0.9 % IV SOLN
1.0000 g | Freq: Once | INTRAVENOUS | Status: AC
  Administered 2020-05-11: 1 g via INTRAVENOUS
  Filled 2020-05-11: qty 10

## 2020-05-11 MED ORDER — NITROGLYCERIN IN D5W 200-5 MCG/ML-% IV SOLN
0.0000 ug/min | INTRAVENOUS | Status: DC
  Administered 2020-05-11: 155 ug/min via INTRAVENOUS
  Administered 2020-05-11: 5 ug/min via INTRAVENOUS
  Administered 2020-05-11: 120 ug/min via INTRAVENOUS
  Administered 2020-05-12: 110 ug/min via INTRAVENOUS
  Administered 2020-05-12: 160 ug/min via INTRAVENOUS
  Filled 2020-05-11 (×4): qty 250

## 2020-05-11 MED ORDER — FUROSEMIDE 10 MG/ML IJ SOLN
40.0000 mg | Freq: Once | INTRAMUSCULAR | Status: AC
  Administered 2020-05-11: 40 mg via INTRAVENOUS
  Filled 2020-05-11: qty 4

## 2020-05-11 MED ORDER — SODIUM CHLORIDE 0.9% FLUSH
3.0000 mL | Freq: Two times a day (BID) | INTRAVENOUS | Status: DC
  Administered 2020-05-12 – 2020-05-13 (×3): 3 mL via INTRAVENOUS

## 2020-05-11 MED ORDER — SODIUM CHLORIDE 0.9% FLUSH: 3.0000 mL | INTRAVENOUS | Status: DC | PRN

## 2020-05-11 MED ORDER — FUROSEMIDE 10 MG/ML IJ SOLN
40.0000 mg | Freq: Two times a day (BID) | INTRAMUSCULAR | Status: DC
  Administered 2020-05-11 – 2020-05-12 (×2): 40 mg via INTRAVENOUS
  Filled 2020-05-11 (×2): qty 4

## 2020-05-11 MED ORDER — CARVEDILOL 12.5 MG PO TABS
25.0000 mg | ORAL_TABLET | Freq: Two times a day (BID) | ORAL | Status: DC
  Administered 2020-05-11 – 2020-05-13 (×4): 25 mg via ORAL
  Filled 2020-05-11 (×4): qty 2

## 2020-05-11 MED ORDER — AMLODIPINE BESYLATE 5 MG PO TABS
5.0000 mg | ORAL_TABLET | Freq: Every day | ORAL | Status: DC
  Administered 2020-05-11 – 2020-05-12 (×2): 5 mg via ORAL
  Filled 2020-05-11 (×2): qty 1

## 2020-05-11 NOTE — Care Plan (Signed)
This 69 years old male with medical history significant for insulin-dependent diabetes mellitus, hypertension, chronic diastolic CHF presented in the emergency department for the evaluation of acute onset of shortness of breath. He was hypoxic and hypertensive after EMS arrived.  Patient was placed on BiPAP after he arrived in the ED.  He was found to have a blood pressure of 200/90. He was started on nitroglycerin infusion. Patient was admitted for acute on chronic respiratory failure could be secondary to flash pulmonary edema.  He was given Lasix and antibiotics in the ED.  Cardiology consulted for elevated troponin and hypertensive urgency.  Patient home medications resumed,  Patient is continued on nitroglycerin drip.  Blood pressure slightly improving.  Patient was tried to wean down on nasal cannula but he became hypoxic,  placed back on BiPAP. Patient is seen and examined the ED.  We will continue to monitor closely.

## 2020-05-11 NOTE — ED Notes (Addendum)
Pt's sats 85% on 2L [place], no improvement with 6L [place]- notified respiratory to place back on Bipap. Will place on nonrebreather until resp arrives. Notified attending MD

## 2020-05-11 NOTE — ED Notes (Signed)
Took pt off bipap with permission from admitting MD. Placed on 2L [place]. Tolerating well.

## 2020-05-11 NOTE — Progress Notes (Signed)
Pt currently off BIPAP.  

## 2020-05-11 NOTE — Consult Note (Addendum)
Cardiology Consultation:   Chaney ID: Robert Chaney MRN: [id number]; DOB: 04-Dec-1950  Admit date: 05/01/2020 Date of Consult: 05/13/2020  Primary Care Provider: [name], MD [facility] Cardiologist: Robert Dawley, MD  [place] Chaney:  None    Chaney Profile:   Robert Chaney is a 69 y.o. male with a hx of hypertrophic cardiomyopathy, HTN, HLD, chronic diastolic heart failure, CKD stage 4, diabetes, OSA on CPAP, on chronic 4L O2 (although is unsure why/no tobacco/copd hx) who is being seen today for Robert evaluation of chest pain at Robert request of Dr. Dwyane Chaney.  History of Present Illness:   Robert Chaney has been followed by Dr. Meda Chaney for Robert above cardiac issues. Echo in 2017 was suspicious for HCM and he underwent cMRI confirming HCM with a basal septum of 63mm. He had a low risk stress test February 2019. Robert Chaney had an echo in 01/2019 showing severe LVH with a septal thickness of 2.1cm. These was no evidence of significant outflow tract gradient or SAM on echo 01/2019. More recently Robert Chaney was referred to Robert hypertension clinic for persistently elevated blood pressures.   Robert Chaney presented to Robert ED 04/27/2020 for shortness of breath. Last night Chaney had sudden onset shortness of breath. HE had felt fine earlier that day and then at around 5pm he started having shortness of breath. He wears 4L O2 at home (although is unsure why). He checked his O2 and it was in Robert 60s and EMS was called. He also reported chest pain, substernal and pressure like. Worse with inspiration. EMS arrived who found BP 260/120 and he was transported to ED on CPAP. Denied recent fever, chills, lower leg edema, weight gain.   In Robert ED he was on Bipap and hypertensive with SBP up to 200. He was tachypneic. CXR showed patchy opacities concerning for edema vs infection. Labs showed potassium 5.0, creatinine 2.54, WBC 9.1, Hgb 11. BNP 817, HS trop 72>66. Respiratory panel negative.  He was given IV lasix, IV abx,  and started on IV NTG infusion.   Past Medical History:  Diagnosis Date  . Altered mental status    a. 05/2017 - adm with blurred vision, somnolence in setting of AKI and high blood sugar.  . Anemia   . CKD (chronic kidney disease), stage III ([place])   . Diabetes mellitus   . Diastolic CHF, chronic (HCC)    A.  03/2009 Echo: EF 60-65%, Gr II diast dysfxn  . Elevated troponin    a. 2013 - troponin 1.4. b. 2019 - troponin 0.32; neg nuc 07/2017.  . Glaucoma   . Hyperlipidemia    HYPERCHOLESTEROLEMIA  . Hypertension    MARKED LEFT VENTRICULAR HYPERTROPHY BY PREVIOUS ECHOCARDIOGRAM--HE HAS HYPERDYNAMIC LEFT VENTRICULAR SYSTOLIC FUNCTION AND HAS IMPAIRED RELAXATION BY ECHO  . Hypertrophic cardiomyopathy ([place])   . NSVT (nonsustained ventricular tachycardia) ([place])   . Premature atrial contractions   . PVC's (premature ventricular contractions)   . SVT (supraventricular tachycardia) (HCC)     Past Surgical History:  Procedure Laterality Date  . NO PAST SURGERIES       Home Medications:  Prior to Admission medications   Medication Sig Start Date End Date Taking? Authorizing Provider  amLODipine (NORVASC) 5 MG tablet TAKE 1 TABLET BY MOUTH AT BEDTIME **NOTE  DOSE  CHANGE** Chaney taking differently: Take 5 mg by mouth at bedtime.  04/24/20  Yes Skeet Latch, MD  aspirin EC 81 MG tablet Take 1 tablet (81 mg total)  by mouth daily. Swallow whole. 03/02/20  Yes Bhagat, Bhavinkumar, PA  atorvastatin (LIPITOR) 20 MG tablet Take 1 tablet (20 mg total) by mouth daily. 03/16/18  Yes Dorothy Spark, MD  brimonidine (ALPHAGAN) 0.2 % ophthalmic solution Place 1 drop into Robert right eye 2 (two) times daily. 10/06/19  Yes [provider]  carvedilol (COREG) 25 MG tablet Take 2 tablets (50 mg total) by mouth 2 (two) times daily. 02/21/20 05/09/21 Yes Dessa Phi, DO  cholecalciferol (VITAMIN D3) 25 MCG (1000 UT) tablet Take 1,000 Units by mouth daily.   Yes  [provider]  cloNIDine (CATAPRES) 0.2 MG tablet Take 1 tablet (0.2 mg total) by mouth 3 (three) times daily. 02/21/20 05/09/21 Yes Dessa Phi, DO  diclofenac Sodium (VOLTAREN) 1 % GEL Apply 4 g topically 4 (four) times daily as needed for pain. 02/07/20  Yes [provider]  doxazosin (CARDURA) 2 MG tablet Take 1 tablet (2 mg total) by mouth daily. 05/09/20  Yes Skeet Latch, MD  furosemide (LASIX) 80 MG tablet Take 1 tablet (80 mg total) by mouth daily. 03/16/20  Yes Skeet Latch, MD  hydrALAZINE (APRESOLINE) 100 MG tablet Take 1 tablet (100 mg total) by mouth 3 (three) times daily. 03/16/20  Yes Skeet Latch, MD  insulin aspart (NOVOLOG) 100 UNIT/ML injection Inject 0-6 Units into Robert skin 3 (three) times daily with meals. Sliding scale 09/02/19  Yes [provider]  Insulin Glargine (TOUJEO MAX SOLOSTAR) 300 UNIT/ML SOPN Inject 18 Units into Robert skin at bedtime.   Yes [provider]  isosorbide mononitrate (IMDUR) 60 MG 24 hr tablet Take 60 mg by mouth daily. 04/27/20  Yes [provider]  latanoprost (XALATAN) 0.005 % ophthalmic solution Place 1 drop into Robert left eye at bedtime. 10/06/19  Yes [provider]  nitroGLYCERIN (NITROSTAT) 0.4 MG SL tablet DISSOLVE ONE TABLET UNDER Robert TONGUE EVERY 5 MINUTES AS NEEDED FOR CHEST PAIN. Chaney taking differently: Place 0.4 mg under Robert tongue every 5 (five) minutes as needed for chest pain.  03/27/20  Yes Dorothy Spark, MD  glucose blood (ONETOUCH VERIO) test strip Use as instructed to test blood sugar 3 times daily E11.65 03/12/19   Shamleffer, Melanie Crazier, MD  Insulin Pen Needle (B-D UF III MINI PEN NEEDLES) 31G X 5 MM MISC Four times daily 06/30/18   Shamleffer, Melanie Crazier, MD    Inpatient Medications: Scheduled Meds: . furosemide  40 mg Intravenous BID  . heparin  5,000 Units Subcutaneous Q8H  . insulin aspart  0-5 Units Subcutaneous QHS  . insulin aspart  0-9  Units Subcutaneous TID WC  . insulin glargine  9 Units Subcutaneous QHS  . sodium chloride flush  3 mL Intravenous Q12H   Continuous Infusions: . sodium chloride    . nitroGLYCERIN 120 mcg/min (05/21/2020 1206)   PRN Meds: sodium chloride, acetaminophen, ondansetron (ZOFRAN) IV, sodium chloride flush  Allergies:    Allergies  Allergen Reactions  . Aspirin Itching    Social History:   Social History   Socioeconomic History  . Marital status: Legally Separated    Spouse name: Not on file  . Number of children: Not on file  . Years of education: Not on file  . Highest education level: Not on file  Occupational History  . Occupation: retired    Fish farm manager: [employer]    Comment: At [employer]  . Smoking status: Never Smoker  . Smokeless tobacco: Never Used  Vaping Use  .  Vaping Use: Never used  Substance and Sexual Activity  . Alcohol use: No  . Drug use: No  . Sexual activity: Never  Other Topics Concern  . Not on file  Social History Narrative   Originally from [place].    Social Determinants of Health   Financial Resource Strain:   . Difficulty of Paying Living Expenses: Not on file  Food Insecurity:   . Worried About Charity fundraiser in [place] Last Year: Not on file  . Ran Out of Food in [place] Last Year: Not on file  Transportation Needs:   . Lack of Transportation (Medical): Not on file  . Lack of Transportation (Non-Medical): Not on file  Physical Activity:   . Days of Exercise per Week: Not on file  . Minutes of Exercise per Session: Not on file  Stress:   . Feeling of Stress : Not on file  Social Connections:   . Frequency of Communication with Friends and Family: Not on file  . Frequency of Social Gatherings with Friends and Family: Not on file  . Attends Religious Services: Not on file  . Active Member of Clubs or Organizations: Not on file  . Attends Archivist Meetings: Not on file  . Marital Status: Not on  file  Intimate Partner Violence:   . Fear of Current or Ex-Partner: Not on file  . Emotionally Abused: Not on file  . Physically Abused: Not on file  . Sexually Abused: Not on file    Family History:   Family History  Problem Relation Age of Onset  . Hypertension Father   . Stroke Father   . Hypertension Mother   . Diabetes Brother   . Hypertension Brother   . Diabetes Brother      ROS:  Please see Robert history of present illness.  All other ROS reviewed and negative.     Physical Exam/Data:   Vitals:   04/26/2020 1030 04/26/2020 1045 05/20/2020 1100 05/01/2020 1115  BP: (!) 166/89 (!) 163/90 (!) 165/80 (!) 168/82  Pulse: 60 60 (!) 56 62  Resp: 12 18 12 19   SpO2: 100% 100% 100% 100%  Weight:      Height:       No intake or output data in Robert 24 hours ending 05/20/2020 1303 Last 3 Weights 05/21/2020 05/09/2020 03/16/2020  Weight (lbs) 179 lb 10.8 oz 179 lb 9.6 oz 176 lb  Weight (kg) 81.5 kg 81.466 kg 79.833 kg     Body mass index is 27.32 kg/m.  General:  Well nourished, well developed, in no acute distress HEENT: normal Lymph: no adenopathy Neck: + JVD Endocrine:  No thryomegaly Vascular: No carotid bruits; FA pulses 2+ bilaterally without bruits  Cardiac:  normal S1, S2; RRR; no murmur  Lungs:  clear to auscultation bilaterally, no wheezing, rhonchi or rales  Abd: soft, nontender, no hepatomegaly  Ext: minimal pedal edema Musculoskeletal:  No deformities, BUE and BLE strength normal and equal Skin: warm and dry  Neuro:  CNs 2-12 intact, no focal abnormalities noted Psych:  Normal affect   EKG:  Robert EKG was personally reviewed and demonstrates:  NSR, 81bpm, LVH with repol abnormalities, ST depression more pronounced Telemetry:  Telemetry was personally reviewed and demonstrates:  NSR, HR 60-70s, 4beats NSVT, PVCs  Relevant CV Studies:  Limited Echo 01/2020   FINDINGS  Left Ventricle: Limited study with Valsalva does not show any significant  LVOT gradient to  suggest obstruction.  Echo 12/2019  1. Severe asymmetric LVH of Robert basal septum up to 1.8 cm. Prior history  of HOCM. No chordal or mitral valve SAM. No significant LVOT gradient on  this study (HR 63 bpm). No provocative maneuvers performed. Left  ventricular ejection fraction, by  estimation, is 60 to 65%. Robert left ventricle has normal function. Robert left  ventricle has no regional wall motion abnormalities. There is severe  asymmetric left ventricular hypertrophy of Robert basal-septal segment. Left  ventricular diastolic parameters are  consistent with Grade II diastolic dysfunction (pseudonormalization).  Elevated left atrial pressure.  2. Right ventricular systolic function is normal. Robert right ventricular  size is normal. Tricuspid regurgitation signal is inadequate for assessing  PA pressure.  3. Robert mitral valve is grossly normal. Mild mitral valve regurgitation.  No evidence of mitral stenosis.  4. Robert aortic valve is tricuspid. Aortic valve regurgitation is not  visualized. No aortic stenosis is present.  5. There is mild dilatation of Robert ascending aorta measuring 41 mm.  6. Robert inferior vena cava is normal in size with greater than 50%  respiratory variability, suggesting right atrial pressure of 3 mmHg.   Comparison(s): No significant change from prior study. No significant LVOT  gradient on this study.   Heart monitor 04/2019  Sinus bradycardia to sinus tachycardia. Average HR 70 BPM.  18 Supraventricular Tachycardia runs, Robert longest lasting 30 seconds with HR 156 BPM.  Rare PVCs (< 1%), 3 couplets and 1 triplet.   10 day event monitor, no symptoms. Predominant rhythm was sinus rhythm with average rate 70 BPM. 18 Supraventricular Tachycardia runs, Robert longest lasting 30 seconds with HR 156 BPM.  Rare PVCs (< 1%), 3 couplets and 1 triplet. No ventricular arrhythmias.  Myoview 07/2017 Study Highlights   Electrically nondiagnositic due to baseline LVH with  strain  Probable normal perfusion and soft tissue attenuation (diaphragm) No ischemia  LVEF 585  Low risk scan   Laboratory Data:  High Sensitivity Troponin:   Recent Labs  Lab 04/27/2020 0300 05/04/2020 0548  TROPONINIHS 72* 66*     Chemistry Recent Labs  Lab 05/09/20 1120 04/30/2020 0300  NA 138 139  K 5.0 4.7  CL 106 104  CO2 25 25  GLUCOSE 178* 197*  BUN 41* 44*  CREATININE 2.54* 2.86*  CALCIUM 8.9 8.9  GFRNONAA 24* 23*  GFRAA 28*  --   ANIONGAP  --  10    No results for input(s): PROT, ALBUMIN, AST, ALT, ALKPHOS, BILITOT in Robert last 168 hours. Hematology Recent Labs  Lab 05/19/2020 0300  WBC 9.1  RBC 3.53*  HGB 11.0*  HCT 36.5*  MCV 103.4*  MCH 31.2  MCHC 30.1  RDW 14.1  PLT 247   BNP Recent Labs  Lab 05/19/2020 0300  BNP 817.1*    DDimer No results for input(s): DDIMER in Robert last 168 hours.   Radiology/Studies:  DG Chest Port 1 View  Result Date: 05/20/2020 CLINICAL DATA:  Shortness of breath EXAM: PORTABLE CHEST 1 VIEW COMPARISON:  Radiograph 02/18/2020 FINDINGS: Patchy heterogeneous opacity is present most focally in Robert right mid lung and in Robert left perihilar and retrocardiac spaces. Several more nodular opacities are present in Robert left lung and right lung base as well. No pneumothorax or visible effusion. Diffuse interstitial opacities present as well. Some bandlike in streaky opacities likely reflect subsegmental atelectasis or scarring. Cardiomegaly is similar to comparisons with a calcified aorta. Robert osseous structures appear diffusely demineralized which may limit detection of small or  nondisplaced fractures. No acute osseous or soft tissue abnormality. Degenerative changes are present in Robert imaged spine and shoulders. Telemetry leads overlie Robert chest. IMPRESSION: 1. Patchy heterogeneous opacity most focally in Robert right mid lung and in Robert left perihilar and retrocardiac spaces, could reflect infection or edema. 2. More nodular opacities are  seen elsewhere in Robert lungs, consider further evaluation with CT. Electronically Signed   By: Lovena Le M.D.   On: 05/21/2020 03:48     Assessment and Plan:   Hypertension Urgency - SBP up to 200 on admission and started on IV Nitro infusion and given IV lasix - history of difficult to control blood pressure being followed by Robert hypertension clinic - PTA amlodipine 5mg  daily, hydralazine 100mg  TID, Imdur 60mg  daily, coreg 25mg  BID, clonidine 0.2mg  daily, lasix 80 mg daily>>meds held - on IV nitro infusion and pressures are still high - start amlodipine, hydralazine and coreg back  Acute on Chronic diastolic heart failure/HOCM - Echo showed 60-65% in July 2021 with G2DD with no obstruction with valsalva on recent echo - IV lasix 40mg  in Robert ED, started on IV lasix 40mg  BID - he has urinated 543mL and feels breathing is improving.  - In Bipap during my exam, wean as tolerated - would continue BB, hydral as above - monitor strict I/Os, daily weights, and creatinine with diuresis  Elevated troponin/chest pain - He reported substernal chest pressure in Robert setting of volume overload and severe HTN although difficult to obtain history - Hs troponin peaked at 72 and now down-trending - EKG showed NSR with severe LVH and repol abnormalities, ST depression appears more pronounced - CP and elevated troponin in Robert setting of Acute CHF and severe hypertension  - no known history of CAD however has RF including HTN, DM2, HLD - Given kidney insufficiency limited on ischemic work-up. Can consider Myoview stress test as OP  OSA on CPAP - continue CPAP  AKI/CKD stage 3 - creatinine on admission 2.86 - baseline 2.3-2.5 - continue to trend with diuresis  DM2 - A1C 6.6%   For questions or updates, please contact [facility] Please consult [email or web address] for contact info under    Signed, Cadence Ninfa Meeker, PA-C  05/21/2020 1:03 PM  Chaney seen and examined and agree with Cadence  Kathlen Mody, PA-C as detailed above  In brief, Robert Chaney is a 69 year old male with a hx of hypertrophic cardiomyopathy (non-obstructive), HTN, HLD, chronic diastolic heart failure, CKD stage 4, DMII, OSA on CPAP, on chronic 4L O2 (although is unsure why/no tobacco/copd hx) who presented with worsening SOB found to have significantly elevated SBP>200. Cardiology is consulted given concern for elevated troponin.   In ED, labs notable for BNP 817, hs-trop 72-->66. CXR with pulmonary edema. Chaney placed on BiPAP and nitro gtt with improvement.  Suspect elevated troponin secondary to severe, uncontrolled hypertension and likely flash pulmonary edema with low suspicion for ACS. Will need aggressive BP management and diuresis. Can pursue ischemic work-up on a non-emergent basis in Robert future once improved from a hypertension/HF standpoint.  TTE 12/2019: Severe asymmetric LVH of Robert basal septum up to 1.8 cm. No chordal or mitral valve SAM. No significant LVOT gradient on this study (HR 63 bpm). Left ventricular ejection fraction, by estimation, is 60 to 65%. Robert left ventricle has normal function. Robert left ventricle has no regional wall motion abnormalities. There is severe asymmetric left ventricular hypertrophy of Robert basal-septal segment. Left ventricular diastolic parameters are consistent with Grade  II diastolic dysfunction (pseudonormalization). Elevated left atrial pressure.   Exam: GEN: Laying comfortably in bed with BiPAP in place Neck: +JVD Cardiac: RRR, 2/6 systolic murmur  Respiratory: Clear to auscultation bilaterally.  GI: Soft, nontender, non-distended  MS: Warm, no edema Neuro:  Nonfocal  Psych: Normal affect   Plan: -Suspect elevated troponin secondary to severe, uncontrolled HTN and pulmonary edema -Continue nitro gtt  -Resume amlodipine, hydralazine and coreg -Continue lasix 40mg  IV BID -Continue BiPAP for respiratory distress -Can pursue functional testing on non-emergent basis  to assess for ischemia once more medically stabilized  Gwyndolyn Kaufman, MD

## 2020-05-11 NOTE — ED Notes (Signed)
Pt bib for sob and cp. Per ems patient was co sob for the past 3 days. Patient 2l Coffee chronichaly. Per ems patient was 70 %SPO2. Patient was hypertensive at 260/120. Patient placed on cpap. Patient is a&ox4 and on bipap

## 2020-05-11 NOTE — ED Provider Notes (Signed)
[facility] EMERGENCY DEPARTMENT Provider Note   CSN: [id number] Arrival date & time: 04/26/2020  0247     History Chief Complaint - shortness of breath  Level 5 caveat due to acuity of condition  Robert Chaney is a 69 y.o. male.  The history is provided by the patient and the EMS personnel.  Shortness of Breath Severity:  Severe Onset quality:  Sudden Timing:  Constant Progression:  Worsening Chronicity:  New Relieved by: CPAP. Worsened by:  Nothing Associated symptoms: chest pain   Patient history of chronic kidney disease, CHF, hypertension presents with shortness of breath.  Patient had sudden onset of shortness of breath tonight and called 911.  On arrival patient was tachypneic and hypertensive.  He was placed on CPAP with some improvement     Past Medical History:  Diagnosis Date  . Altered mental status    a. 05/2017 - adm with blurred vision, somnolence in setting of AKI and high blood sugar.  . Anemia   . CKD (chronic kidney disease), stage III ([place])   . Diabetes mellitus   . Diastolic CHF, chronic (H[facility])    A.  03/2009 Echo: EF 60-65%, Gr II diast dysfxn  . Elevated troponin    a. 2013 - troponin 1.4. b. 2019 - troponin 0.32; neg nuc 07/2017.  . Glaucoma   . Hyperlipidemia    HYPERCHOLESTEROLEMIA  . Hypertension    MARKED LEFT VENTRICULAR HYPERTROPHY BY PREVIOUS ECHOCARDIOGRAM--HE HAS HYPERDYNAMIC LEFT VENTRICULAR SYSTOLIC FUNCTION AND HAS IMPAIRED RELAXATION BY ECHO  . Hypertrophic cardiomyopathy ([place])   . NSVT (nonsustained ventricular tachycardia) ([place])   . Premature atrial contractions   . PVC's (premature ventricular contractions)   . SVT (supraventricular tachycardia) [facility])     Patient Active Problem List   Diagnosis Date Noted  . Acute exacerbation of CHF (congestive heart failure) ([place]) 01/11/2020  . Acute respiratory failure ([place]) 01/11/2020  . Type 2 diabetes mellitus with retinopathy, with long-term current use of insulin  ([place]) 07/16/2019  . Type 2 diabetes mellitus with stage 3a chronic kidney disease, with long-term current use of insulin ([place]) 07/16/2019  . Type 2 diabetes mellitus with diabetic polyneuropathy, with long-term current use of insulin ([place]) 07/16/2019  . OSA (obstructive sleep apnea) 03/30/2019  . Hyperglycemia 07/31/2017  . Near syncope 07/31/2017  . Acute-on-chronic kidney injury ([place]) 07/31/2017  . Acute renal injury ([place]) 06/09/2017  . Hypertensive heart disease 11/07/2016  . Hypertrophic cardiomyopathy ([place]) 07/26/2015  . Dyslipidemia 05/19/2013  . Diabetic hyperosmolar non-ketotic state ([place]) 07/19/2011  . DM (diabetes mellitus) with complications ([place]) 39/08/90  . Stage 4 chronic kidney disease ([place]) 07/19/2011  . Troponin level elevated 07/19/2011  . Prolonged QT interval 07/19/2011  . Hyperkalemia 07/19/2011  . Volume depletion 07/19/2011  . Diastolic CHF, chronic ([place]) 07/19/2011  . Essential hypertension 07/19/2011  . Elevated CPK 07/19/2011  . Hypercholesterolemia 02/20/2011  . Benign hypertensive heart disease without heart failure 02/20/2011    Past Surgical History:  Procedure Laterality Date  . NO PAST SURGERIES         Family History  Problem Relation Age of Onset  . Hypertension Father   . Stroke Father   . Hypertension Mother   . Diabetes Brother   . Hypertension Brother   . Diabetes Brother     Social History   Tobacco Use  . Smoking status: Never Smoker  . Smokeless tobacco: Never Used  Vaping Use  . Vaping Use: Never used  Substance Use  Topics  . Alcohol use: No  . Drug use: No    Home Medications Prior to Admission medications   Medication Sig Start Date End Date Taking? Authorizing Provider  amLODipine (NORVASC) 5 MG tablet TAKE 1 TABLET BY MOUTH AT BEDTIME **NOTE  DOSE  CHANGE** 04/24/20   Skeet Latch, MD  aspirin EC 81 MG tablet Take 1 tablet (81 mg total) by mouth daily. Swallow whole. 03/02/20   Bhagat, Crista Luria, PA    atorvastatin (LIPITOR) 20 MG tablet Take 1 tablet (20 mg total) by mouth daily. 03/16/18   Dorothy Spark, MD  brimonidine (ALPHAGAN) 0.2 % ophthalmic solution Place 1 drop into the right eye 2 (two) times daily. 10/06/19   [provider]  carvedilol (COREG) 25 MG tablet Take 2 tablets (50 mg total) by mouth 2 (two) times daily. 02/21/20 05/09/21  Dessa Phi, DO  cholecalciferol (VITAMIN D3) 25 MCG (1000 UT) tablet Take 1,000 Units by mouth daily.    [provider]  cloNIDine (CATAPRES) 0.2 MG tablet Take 1 tablet (0.2 mg total) by mouth 3 (three) times daily. 02/21/20 05/09/21  Dessa Phi, DO  diclofenac Sodium (VOLTAREN) 1 % GEL Apply 4 g topically 4 (four) times daily as needed for pain. 02/07/20   [provider]  doxazosin (CARDURA) 2 MG tablet Take 1 tablet (2 mg total) by mouth daily. 05/09/20   Skeet Latch, MD  furosemide (LASIX) 80 MG tablet Take 1 tablet (80 mg total) by mouth daily. 03/16/20   Skeet Latch, MD  glucose blood [facility] VERIO) test strip Use as instructed to test blood sugar 3 times daily E11.65 03/12/19   Shamleffer, Melanie Crazier, MD  hydrALAZINE (APRESOLINE) 100 MG tablet Take 1 tablet (100 mg total) by mouth 3 (three) times daily. 03/16/20   Skeet Latch, MD  insulin aspart (NOVOLOG) 100 UNIT/ML injection Inject 0-6 Units into the skin 3 (three) times daily with meals. Sliding scale 09/02/19   [provider]  Insulin Glargine (TOUJEO MAX SOLOSTAR) 300 UNIT/ML SOPN Inject 18 Units into the skin at bedtime.    [provider]  Insulin Pen Needle (B-D UF III MINI PEN NEEDLES) 31G X 5 MM MISC Four times daily 06/30/18   Shamleffer, Melanie Crazier, MD  latanoprost (XALATAN) 0.005 % ophthalmic solution Place 1 drop into the left eye at bedtime. 10/06/19   [provider]  nitroGLYCERIN (NITROSTAT) 0.4 MG SL tablet DISSOLVE ONE TABLET UNDER THE TONGUE EVERY 5 MINUTES AS NEEDED FOR CHEST PAIN. 03/27/20    Dorothy Spark, MD    Allergies    Aspirin  Review of Systems   Review of Systems  Unable to perform ROS: Acuity of condition  Respiratory: Positive for shortness of breath.   Cardiovascular: Positive for chest pain.    Physical Exam Updated Vital Signs BP (!) 148/76   Pulse 63   Resp (!) 21   Ht 1.727 m (5\' 8" )   Wt 81.5 kg   SpO2 99%   BMI 27.32 kg/m   Physical Exam CONSTITUTIONAL: ill appearing HEAD: Normocephalic/atraumatic EYES: EOMI ENMT: CPAP mask in place NECK: supple no meningeal signs SPINE/BACK:entire spine nontender CV: S1/S2 noted, tachycardic LUNGS: Respiratory distress noted, crackles bilaterally ABDOMEN: soft, nontender  NEURO: Pt is awake/alert/appropriate, moves all extremitiesx4.  No facial droop.   EXTREMITIES: pulses normal/equal, full ROM, +Le edema in the lower extremities SKIN: warm, color normal PSYCH: no abnormalities of mood noted, alert and oriented to situation  ED Results / Procedures /  Treatments   Labs (all labs ordered are listed, but only abnormal results are displayed) Labs Reviewed  BASIC METABOLIC PANEL - Abnormal; Notable for the following components:      Result Value   Glucose, Bld 197 (*)    BUN 44 (*)    Creatinine, Ser 2.86 (*)    GFR, Estimated 23 (*)    All other components within normal limits  CBC WITH DIFFERENTIAL/PLATELET - Abnormal; Notable for the following components:   RBC 3.53 (*)    Hemoglobin 11.0 (*)    HCT 36.5 (*)    MCV 103.4 (*)    All other components within normal limits  BRAIN NATRIURETIC PEPTIDE - Abnormal; Notable for the following components:   B Natriuretic Peptide 817.1 (*)    All other components within normal limits  TROPONIN I (HIGH SENSITIVITY) - Abnormal; Notable for the following components:   Troponin I (High Sensitivity) 72 (*)    All other components within normal limits  RESPIRATORY PANEL BY RT PCR (FLU A&B, COVID)  TROPONIN I (HIGH SENSITIVITY)    EKG EKG  Interpretation  Date/Time:  Thursday May 11 2020 02:50:06 EST Ventricular Rate:  81 PR Interval:    QRS Duration: 88 QT Interval:  401 QTC Calculation: 466 R Axis:   86 Text Interpretation: Sinus rhythm Borderline right axis deviation Left ventricular hypertrophy Borderline T abnormalities, inferior leads peaked T waves noted Confirmed by Ripley Fraise [phone]) on 05/04/2020 2:54:00 AM   Radiology DG Chest Port 1 View  Result Date: 04/25/2020 CLINICAL DATA:  Shortness of breath EXAM: PORTABLE CHEST 1 VIEW COMPARISON:  Radiograph 02/18/2020 FINDINGS: Patchy heterogeneous opacity is present most focally in the right mid lung and in the left perihilar and retrocardiac spaces. Several more nodular opacities are present in the left lung and right lung base as well. No pneumothorax or visible effusion. Diffuse interstitial opacities present as well. Some bandlike in streaky opacities likely reflect subsegmental atelectasis or scarring. Cardiomegaly is similar to comparisons with a calcified aorta. The osseous structures appear diffusely demineralized which may limit detection of small or nondisplaced fractures. No acute osseous or soft tissue abnormality. Degenerative changes are present in the imaged spine and shoulders. Telemetry leads overlie the chest. IMPRESSION: 1. Patchy heterogeneous opacity most focally in the right mid lung and in the left perihilar and retrocardiac spaces, could reflect infection or edema. 2. More nodular opacities are seen elsewhere in the lungs, consider further evaluation with CT. Electronically Signed   By: Lovena Le M.D.   On: 05/04/2020 03:48    Procedures .Critical Care Performed by: Ripley Fraise, MD Authorized by: Ripley Fraise, MD   Critical care provider statement:    Critical care time (minutes):  35   Critical care start time:  05/02/2020 3:00 AM   Critical care end time:  05/22/2020 3:35 AM   Critical care time was exclusive of:   Separately billable procedures and treating other patients   Critical care was necessary to treat or prevent imminent or life-threatening deterioration of the following conditions:  Respiratory failure and cardiac failure   Critical care was time spent personally by me on the following activities:  Ordering and performing treatments and interventions, ordering and review of laboratory studies, ordering and review of radiographic studies, pulse oximetry, re-evaluation of patient's condition, review of old charts, examination of patient, evaluation of patient's response to treatment, discussions with consultants and development of treatment plan with patient or surrogate   I assumed direction of  critical care for this patient from another provider in my specialty: no        Medications Ordered in ED Medications  nitroGLYCERIN 50 mg in dextrose 5 % 250 mL (0.2 mg/mL) infusion (120 mcg/min Intravenous Rate/Dose Change 05/12/2020 0439)  cefTRIAXone (ROCEPHIN) 1 g in sodium chloride 0.9 % 100 mL IVPB (0 g Intravenous Stopped 05/21/2020 0447)  azithromycin (ZITHROMAX) 500 mg in sodium chloride 0.9 % 250 mL IVPB (0 mg Intravenous Stopped 05/05/2020 0612)  furosemide (LASIX) injection 40 mg (40 mg Intravenous Given 04/27/2020 0447)    ED Course  I have reviewed the triage vital signs and the nursing notes.  Pertinent labs & imaging results that were available during my care of the patient were reviewed by me and considered in my medical decision making (see chart for details).    MDM Rules/Calculators/A&P                          3:04 AM Patient presents with shortness of breath.  EMS had to place patient on CPAP during transport due to distress.  This improved the symptoms.  Strong suspicion for acute pulmonary edema.  Nitroglycerin has been ordered.  We will follow closely 4:16 AM Patient with focal infiltrate as well as some edema.  Will treat patient for pneumonia as well as CHF.  Overall he is  improving on bipap 6:18 AM Patient continues to improve.  He has improved blood pressure control He is resting comfortably now on BiPAP Discussed with Dr. Myna Hidalgo for admission. Patient with respiratory failure requiring BiPAP as well as pulmonary edema likely hypertensive emergency. Also cover for community-acquired pneumonia  Final Clinical Impression(s) / ED Diagnoses Final diagnoses:  Acute pulmonary edema ([place])  AKI (acute kidney injury) ([place])  Acute respiratory failure with hypoxia ([place])  Community acquired pneumonia of right middle lobe of lung    Rx / DC Orders ED Discharge Orders    None       Ripley Fraise, MD 04/25/2020 [phone]

## 2020-05-11 NOTE — H&P (Signed)
History and Physical    EZIO WIECK VQX:[id number] DOB: June 25, 1950 DOA: 05/09/2020  PCP: [name], MD   Patient coming from: home   Chief Complaint: SOB   HPI: DAMEK ENDE is a 69 y.o. male with medical history significant for insulin-dependent diabetes mellitus, hypertension, chronic diastolic CHF, and severe asymmetric LVH on echo but no obstruction seen with Valsalva, now presenting to the emergency department for evaluation acute onset shortness of breath.  Patient reports that he was in his usual state of health till last night when he developed sudden onset of shortness of breath.  He denies any cough or fevers.  Has not noticed much change in his chronic lower extremity swelling.  Denies any headache or focal numbness or weakness.  He was saturating 70% with EMS and had blood pressure of 260/120.  He was transported to the ED on CPAP.  ED Course: Upon arrival to the ED, patient is found to be saturating well on BiPAP, tachypneic, and hypertensive to 200/90.  EKG features sinus rhythm with LVH.  Chest x-ray with patchy opacities concerning for edema versus infection.  Chemistry panel notable for glucose of 197 and creatinine 2.86.  CBC with mild macrocytic anemia.  BNP is elevated to 817 and high-sensitivity troponin elevated to 72.  Patient was started on BiPAP and nitroglycerin infusion and treated with IV Lasix, Rocephin, and azithromycin.  Review of Systems:  All other systems reviewed and apart from HPI, are negative.  Past Medical History:  Diagnosis Date  . Altered mental status    a. 05/2017 - adm with blurred vision, somnolence in setting of AKI and high blood sugar.  . Anemia   . CKD (chronic kidney disease), stage III ([place])   . Diabetes mellitus   . Diastolic CHF, chronic ([facility])    A.  03/2009 Echo: EF 60-65%, Gr II diast dysfxn  . Elevated troponin    a. 2013 - troponin 1.4. b. 2019 - troponin 0.32; neg nuc 07/2017.  . Glaucoma   . Hyperlipidemia     HYPERCHOLESTEROLEMIA  . Hypertension    MARKED LEFT VENTRICULAR HYPERTROPHY BY PREVIOUS ECHOCARDIOGRAM--HE HAS HYPERDYNAMIC LEFT VENTRICULAR SYSTOLIC FUNCTION AND HAS IMPAIRED RELAXATION BY ECHO  . Hypertrophic cardiomyopathy ([place])   . NSVT (nonsustained ventricular tachycardia) ([place])   . Premature atrial contractions   . PVC's (premature ventricular contractions)   . SVT (supraventricular tachycardia) (H[facility])     Past Surgical History:  Procedure Laterality Date  . NO PAST SURGERIES      Social History:   reports that he has never smoked. He has never used smokeless tobacco. He reports that he does not drink alcohol and does not use drugs.  Allergies  Allergen Reactions  . Aspirin Itching    Family History  Problem Relation Age of Onset  . Hypertension Father   . Stroke Father   . Hypertension Mother   . Diabetes Brother   . Hypertension Brother   . Diabetes Brother      Prior to Admission medications   Medication Sig Start Date End Date Taking? Authorizing Provider  amLODipine (NORVASC) 5 MG tablet TAKE 1 TABLET BY MOUTH AT BEDTIME **NOTE  DOSE  CHANGE** 04/24/20   Skeet Latch, MD  aspirin EC 81 MG tablet Take 1 tablet (81 mg total) by mouth daily. Swallow whole. 03/02/20   Bhagat, Crista Luria, PA  atorvastatin (LIPITOR) 20 MG tablet Take 1 tablet (20 mg total) by mouth daily. 03/16/18   Dorothy Spark, MD  brimonidine (ALPHAGAN) 0.2 % ophthalmic solution Place 1 drop into the right eye 2 (two) times daily. 10/06/19   [provider]  carvedilol (COREG) 25 MG tablet Take 2 tablets (50 mg total) by mouth 2 (two) times daily. 02/21/20 05/09/21  Dessa Phi, DO  cholecalciferol (VITAMIN D3) 25 MCG (1000 UT) tablet Take 1,000 Units by mouth daily.    [provider]  cloNIDine (CATAPRES) 0.2 MG tablet Take 1 tablet (0.2 mg total) by mouth 3 (three) times daily. 02/21/20 05/09/21  Dessa Phi, DO  diclofenac Sodium (VOLTAREN) 1 % GEL Apply 4 g  topically 4 (four) times daily as needed for pain. 02/07/20   [provider]  doxazosin (CARDURA) 2 MG tablet Take 1 tablet (2 mg total) by mouth daily. 05/09/20   Skeet Latch, MD  furosemide (LASIX) 80 MG tablet Take 1 tablet (80 mg total) by mouth daily. 03/16/20   Skeet Latch, MD  glucose blood [facility] VERIO) test strip Use as instructed to test blood sugar 3 times daily E11.65 03/12/19   Shamleffer, Melanie Crazier, MD  hydrALAZINE (APRESOLINE) 100 MG tablet Take 1 tablet (100 mg total) by mouth 3 (three) times daily. 03/16/20   Skeet Latch, MD  insulin aspart (NOVOLOG) 100 UNIT/ML injection Inject 0-6 Units into the skin 3 (three) times daily with meals. Sliding scale 09/02/19   [provider]  Insulin Glargine (TOUJEO MAX SOLOSTAR) 300 UNIT/ML SOPN Inject 18 Units into the skin at bedtime.    [provider]  Insulin Pen Needle (B-D UF III MINI PEN NEEDLES) 31G X 5 MM MISC Four times daily 06/30/18   Shamleffer, Melanie Crazier, MD  latanoprost (XALATAN) 0.005 % ophthalmic solution Place 1 drop into the left eye at bedtime. 10/06/19   [provider]  nitroGLYCERIN (NITROSTAT) 0.4 MG SL tablet DISSOLVE ONE TABLET UNDER THE TONGUE EVERY 5 MINUTES AS NEEDED FOR CHEST PAIN. 03/27/20   Dorothy Spark, MD    Physical Exam: Vitals:   05/16/2020 0515 05/06/2020 0530 05/20/2020 0545 05/08/2020 0600  BP: (!) 161/85 (!) 156/79 (!) 141/82 (!) 148/76  Pulse: 66 65 64 63  Resp: 17 13 (!) 21 (!) 21  SpO2: 100% 100% 100% 99%  Weight:      Height:        Constitutional: NAD, calm  Eyes: PERTLA, lids and conjunctivae normal ENMT: Mucous membranes are moist. Posterior pharynx clear of any exudate or lesions.   Neck: normal, supple, no masses, no thyromegaly Respiratory: Tachypnea, increased WOB, scattered rales. No pallor or cyanosis.  Cardiovascular: S1 & S2 heard, regular rate and rhythm. Pretibial edema bilaterally.  Abdomen: No distension, no  tenderness, soft. Bowel sounds active.  Musculoskeletal: no clubbing / cyanosis. No joint deformity upper and lower extremities.   Skin: no significant rashes, lesions, ulcers. Warm, dry, well-perfused. Neurologic: No gross facial asymmetry. Sensation intact. Moving all extremities.  Psychiatric: Alert and oriented to person, place, and situation. Very pleasant and cooperative.    Labs and Imaging on Admission: I have personally reviewed following labs and imaging studies  CBC: Recent Labs  Lab 05/15/2020 0300  WBC 9.1  NEUTROABS 6.7  HGB 11.0*  HCT 36.5*  MCV 103.4*  PLT 166   Basic Metabolic Panel: Recent Labs  Lab 05/09/20 1120 05/12/2020 0300  NA 138 139  K 5.0 4.7  CL 106 104  CO2 25 25  GLUCOSE 178* 197*  BUN 41* 44*  CREATININE 2.54* 2.86*  CALCIUM 8.9 8.9   GFR:  Estimated Creatinine Clearance: 22.9 mL/min (A) (by C-G formula based on SCr of 2.86 mg/dL (H)). Liver Function Tests: No results for input(s): AST, ALT, ALKPHOS, BILITOT, PROT, ALBUMIN in the last 168 hours. No results for input(s): LIPASE, AMYLASE in the last 168 hours. No results for input(s): AMMONIA in the last 168 hours. Coagulation Profile: No results for input(s): INR, PROTIME in the last 168 hours. Cardiac Enzymes: No results for input(s): CKTOTAL, CKMB, CKMBINDEX, TROPONINI in the last 168 hours. BNP (last 3 results) No results for input(s): PROBNP in the last 8760 hours. HbA1C: No results for input(s): HGBA1C in the last 72 hours. CBG: No results for input(s): GLUCAP in the last 168 hours. Lipid Profile: No results for input(s): CHOL, HDL, LDLCALC, TRIG, CHOLHDL, LDLDIRECT in the last 72 hours. Thyroid Function Tests: No results for input(s): TSH, T4TOTAL, FREET4, T3FREE, THYROIDAB in the last 72 hours. Anemia Panel: No results for input(s): VITAMINB12, FOLATE, FERRITIN, TIBC, IRON, RETICCTPCT in the last 72 hours. Urine analysis:    Component Value Date/Time   COLORURINE YELLOW  07/31/2017 [place] 07/31/2017 1840   LABSPEC 1.020 07/31/2017 1840   PHURINE 6.0 07/31/2017 1840   GLUCOSEU >=500 (A) 07/31/2017 1840   HGBUR NEGATIVE 07/31/2017 1840   BILIRUBINUR NEGATIVE 07/31/2017 1840   KETONESUR 5 (A) 07/31/2017 1840   PROTEINUR NEGATIVE 07/31/2017 1840   UROBILINOGEN 1.0 07/19/2011 1335   NITRITE POSITIVE (A) 07/31/2017 1840   LEUKOCYTESUR NEGATIVE 07/31/2017 1840   Sepsis Labs: @LABRCNTIP (procalcitonin:4,lacticidven:4) ) Recent Results (from the past 240 hour(s))  Respiratory Panel by RT PCR (Flu A&B, Covid) - Nasopharyngeal Swab     Status: None   Collection Time: 04/30/2020  3:00 AM   Specimen: Nasopharyngeal Swab  Result Value Ref Range Status   SARS Coronavirus 2 by RT PCR NEGATIVE NEGATIVE Final    Comment: (NOTE) SARS-CoV-2 target nucleic acids are NOT DETECTED.  The SARS-CoV-2 RNA is generally detectable in upper respiratoy specimens during the acute phase of infection. The lowest concentration of SARS-CoV-2 viral copies this assay can detect is 131 copies/mL. A negative result does not preclude SARS-Cov-2 infection and should not be used as the sole basis for treatment or other patient management decisions. A negative result may occur with  improper specimen collection/handling, submission of specimen other than nasopharyngeal swab, presence of viral mutation(s) within the areas targeted by this assay, and inadequate number of viral copies (<131 copies/mL). A negative result must be combined with clinical observations, patient history, and epidemiological information. The expected result is Negative.  Fact Sheet for Patients:  PinkCheek.be  Fact Sheet for Healthcare Providers:  GravelBags.it  This test is no t yet approved or cleared by the [place] FDA and  has been authorized for detection and/or diagnosis of SARS-CoV-2 by FDA under an Emergency Use  Authorization (EUA). This EUA will remain  in effect (meaning this test can be used) for the duration of the COVID-19 declaration under Section 564(b)(1) of the Act, 21 U.S.C. section 360bbb-3(b)(1), unless the authorization is terminated or revoked sooner.     Influenza A by PCR NEGATIVE NEGATIVE Final   Influenza B by PCR NEGATIVE NEGATIVE Final    Comment: (NOTE) The Xpert Xpress SARS-CoV-2/FLU/RSV assay is intended as an aid in  the diagnosis of influenza from Nasopharyngeal swab specimens and  should not be used as a sole basis for treatment. Nasal washings and  aspirates are unacceptable for Xpert Xpress SARS-CoV-2/FLU/RSV  testing.  Fact Sheet for Patients:  PinkCheek.be  Fact Sheet for Healthcare Providers: GravelBags.it  This test is not yet approved or cleared by the [place] FDA and  has been authorized for detection and/or diagnosis of SARS-CoV-2 by  FDA under an Emergency Use Authorization (EUA). This EUA will remain  in effect (meaning this test can be used) for the duration of the  Covid-19 declaration under Section 564(b)(1) of the Act, 21  U.S.C. section 360bbb-3(b)(1), unless the authorization is  terminated or revoked. Performed at [facility], [place] [address] [place], [place] [address]      Radiological Exams on Admission: DG Chest Port 1 View  Result Date: 05/13/2020 CLINICAL DATA:  Shortness of breath EXAM: PORTABLE CHEST 1 VIEW COMPARISON:  Radiograph 02/18/2020 FINDINGS: Patchy heterogeneous opacity is present most focally in the right mid lung and in the left perihilar and retrocardiac spaces. Several more nodular opacities are present in the left lung and right lung base as well. No pneumothorax or visible effusion. Diffuse interstitial opacities present as well. Some bandlike in streaky opacities likely reflect subsegmental atelectasis or scarring. Cardiomegaly is similar to  comparisons with a calcified aorta. The osseous structures appear diffusely demineralized which may limit detection of small or nondisplaced fractures. No acute osseous or soft tissue abnormality. Degenerative changes are present in the imaged spine and shoulders. Telemetry leads overlie the chest. IMPRESSION: 1. Patchy heterogeneous opacity most focally in the right mid lung and in the left perihilar and retrocardiac spaces, could reflect infection or edema. 2. More nodular opacities are seen elsewhere in the lungs, consider further evaluation with CT. Electronically Signed   By: Lovena Le M.D.   On: 05/10/2020 03:48    EKG: Independently reviewed. Sinus rhythm, LVH.   Assessment/Plan   1. Acute on chronic respiratory failure - Patient with HFpEF and chronic 2 Lpm supplemental O2 requirement developed acute-onset SOB last night, had BP 260/120 and sat 70% with EMS, and is found to have patchy opacities on CXR concerning for edema and/or infection but denies productive cough or fevers, does not have leukocytosis, and clinical scenario more suggestive of acute CHF, possibly flash pulmonary edema  - He was given Lasix and antibiotics in ED  - Check procalcitonin, continue diuresis, consider repeat CXR, and hold further antibiotics for now    2. Acute on chronic diastolic CHF  - Presents with acute-onset SOB, saturating 70% with EMS, and is found to have severely elevated BP with elevated BNP and suspected edema on CXR  - EF was 60-65% in July 2021 with grade II diastolic dysfunction, hx of HOCM but no obstruction with valsalva during most recent echo  - He was given Lasix 40 mg IV in ED and is diuresing  - Continue diuresis with Lasix 40 mg IV q12h, monitor wt and I/Os   3. Hypertensive urgency  - SBP 200/90 on admission, improving with diuresis and NTG   - Continue diuresis, continue nitroglycerin infusion, follow-up pharmacy medication-reconciliation for home antihypertensives    4.  Insulin-dependent DM  - A1c was 6.0% in May 2021  - Continue CBG checks and insulin    5. AKI on CKD IV  - SCr is 2.86 on admission, up from apparent baseline closer to 2.3-2.5  - Renally-dose medications, monitor closely while diuresis    6. Elevated troponin  - HS troponin is mildly elevated and decreasing  - Likely related to acute CHF and severe HTN rather than ACS  - Continue cardiac monitoring for now and treat  underlying CHF and HTN    DVT prophylaxis: sq heparin  Code Status: Full  Family Communication: Discussed with patient  Disposition Plan:  Patient is from: Home  Anticipated d/c is to: TBD Anticipated d/c date is: 05/14/20 Patient currently: Pending improvement in respiratory status and BP  Consults called: None  Admission status: Inpatient     Vianne Bulls, MD [facility]  05/10/2020, 6:43 AM

## 2020-05-11 NOTE — ED Notes (Signed)
Lunch Tray Ordered @ 1000. 

## 2020-05-11 NOTE — ED Notes (Signed)
Resp at bedside top place on bipap

## 2020-05-11 NOTE — Plan of Care (Signed)
?  Problem: Clinical Measurements: ?Goal: Ability to maintain a body temperature in the normal range will improve ?Outcome: Progressing ?  ?Problem: Respiratory: ?Goal: Ability to maintain adequate ventilation will improve ?Outcome: Progressing ?  ?

## 2020-05-11 NOTE — Plan of Care (Signed)
  Problem: Activity: Goal: Ability to tolerate increased activity will improve Outcome: Progressing   Problem: Clinical Measurements: Goal: Ability to maintain a body temperature in the normal range will improve Outcome: Progressing   Problem: Respiratory: Goal: Ability to maintain adequate ventilation will improve Outcome: Progressing Goal: Ability to maintain a clear airway will improve Outcome: Progressing   

## 2020-05-11 NOTE — ED Notes (Signed)
Report given to day shift nurse

## 2020-05-11 NOTE — ED Notes (Signed)
Checked patient blood sugar it was 143 notified RN Hope of blood sugar patient is resting with call bell in reach

## 2020-05-11 NOTE — Progress Notes (Signed)
RN took pt off BiPAP and placed on [place]. Pt did not tolerate that. RN called to notify RT. RT instructed RN to place pt on NRB until RT could get to bedside. RT now at bedside and placed pt on BiPAP. Pt tolerating well at this time.

## 2020-05-11 NOTE — ED Notes (Signed)
Checked patient cbg it was 70 notified RN Hope of blood sugar patient is resting with call bell in reach

## 2020-05-12 DIAGNOSIS — J9621 Acute and chronic respiratory failure with hypoxia: Secondary | ICD-10-CM

## 2020-05-12 LAB — CBC
HCT: 31.1 % — ABNORMAL LOW (ref 39.0–52.0)
Hemoglobin: 9.6 g/dL — ABNORMAL LOW (ref 13.0–17.0)
MCH: 31.1 pg (ref 26.0–34.0)
MCHC: 30.9 g/dL (ref 30.0–36.0)
MCV: 100.6 fL — ABNORMAL HIGH (ref 80.0–100.0)
Platelets: 202 10*3/uL (ref 150–400)
RBC: 3.09 MIL/uL — ABNORMAL LOW (ref 4.22–5.81)
RDW: 13.9 % (ref 11.5–15.5)
WBC: 5 10*3/uL (ref 4.0–10.5)
nRBC: 0 % (ref 0.0–0.2)

## 2020-05-12 LAB — BASIC METABOLIC PANEL
Anion gap: 7 (ref 5–15)
BUN: 42 mg/dL — ABNORMAL HIGH (ref 8–23)
CO2: 27 mmol/L (ref 22–32)
Calcium: 8.8 mg/dL — ABNORMAL LOW (ref 8.9–10.3)
Chloride: 105 mmol/L (ref 98–111)
Creatinine, Ser: 2.54 mg/dL — ABNORMAL HIGH (ref 0.61–1.24)
GFR, Estimated: 26 mL/min — ABNORMAL LOW (ref >60)
Glucose, Bld: 137 mg/dL — ABNORMAL HIGH (ref 70–99)
Potassium: 4 mmol/L (ref 3.5–5.1)
Sodium: 139 mmol/L (ref 135–145)

## 2020-05-12 LAB — PROCALCITONIN: Procalcitonin: 0.33 ng/mL

## 2020-05-12 LAB — GLUCOSE, CAPILLARY
Glucose-Capillary: 133 mg/dL — ABNORMAL HIGH (ref 70–99)
Glucose-Capillary: 159 mg/dL — ABNORMAL HIGH (ref 70–99)
Glucose-Capillary: 139 mg/dL — ABNORMAL HIGH (ref 70–99)
Glucose-Capillary: 137 mg/dL — ABNORMAL HIGH (ref 70–99)
Glucose-Capillary: 101 mg/dL — ABNORMAL HIGH (ref 70–99)

## 2020-05-12 LAB — HEMOGLOBIN A1C
Hgb A1c MFr Bld: 6.2 % — ABNORMAL HIGH (ref 4.8–5.6)
Mean Plasma Glucose: 131.24 mg/dL

## 2020-05-12 MED ORDER — NITROGLYCERIN IN D5W 200-5 MCG/ML-% IV SOLN
INTRAVENOUS | Status: AC
  Filled 2020-05-12: qty 250

## 2020-05-12 MED ORDER — ATORVASTATIN CALCIUM 10 MG PO TABS
20.0000 mg | ORAL_TABLET | Freq: Every day | ORAL | Status: DC
  Administered 2020-05-12 – 2020-05-13 (×2): 20 mg via ORAL
  Filled 2020-05-12 (×2): qty 2

## 2020-05-12 MED ORDER — FUROSEMIDE 40 MG PO TABS
40.0000 mg | ORAL_TABLET | Freq: Two times a day (BID) | ORAL | Status: DC
  Administered 2020-05-12 – 2020-05-13 (×2): 40 mg via ORAL
  Filled 2020-05-12 (×2): qty 1

## 2020-05-12 MED ORDER — ORAL CARE MOUTH RINSE
15.0000 mL | Freq: Two times a day (BID) | OROMUCOSAL | Status: DC
  Administered 2020-05-12 – 2020-05-13 (×3): 15 mL via OROMUCOSAL

## 2020-05-12 MED ORDER — MAGNESIUM SULFATE 2 GM/50ML IV SOLN
2.0000 g | Freq: Once | INTRAVENOUS | Status: AC
  Administered 2020-05-12: 2 g via INTRAVENOUS
  Filled 2020-05-12: qty 50

## 2020-05-12 MED ORDER — CLONIDINE HCL 0.2 MG PO TABS
0.2000 mg | ORAL_TABLET | Freq: Three times a day (TID) | ORAL | Status: DC
  Administered 2020-05-12 – 2020-05-13 (×4): 0.2 mg via ORAL
  Filled 2020-05-12 (×4): qty 1

## 2020-05-12 MED ORDER — AMLODIPINE BESYLATE 10 MG PO TABS
10.0000 mg | ORAL_TABLET | Freq: Every day | ORAL | Status: DC
  Administered 2020-05-13: 10 mg via ORAL
  Filled 2020-05-12: qty 1

## 2020-05-12 NOTE — Progress Notes (Signed)
PROGRESS NOTE    Robert Chaney  HKV:[id number] DOB: [birth year] DOA: 05/05/2020 PCP: [name], MD    Brief Narrative:  This 69 years old male with medical history significant for insulin-dependent diabetes mellitus, hypertension, chronic diastolic CHF presented in the emergency department for the evaluation of acute onset of shortness of breath. He was hypoxic and hypertensive after EMS arrived.  Patient was placed on BiPAP after he arrived in the ED.  He was found to have a blood pressure of 200/90. He was started on nitroglycerin infusion. Patient was admitted for acute on chronic respiratory failure could be secondary to flash pulmonary edema.  He was given Lasix and antibiotics in the ED.  Cardiology consulted for elevated troponin and hypertensive urgency.  Patient home medications resumed,  Patient is continued on nitroglycerin drip.  Blood pressure slightly improving.  Patient was tried to wean down on nasal cannula but he became hypoxic,  placed back on BiPAP.  Back to normal oxygen requirement on 4 L via nasal cannula sats 97%.  Blood pressure improving,  nitroglycerin infusion discontinued.  Assessment & Plan:   Principal Problem:   Acute on chronic respiratory failure with hypoxia (HCC) Active Problems:   Stage 4 chronic kidney disease (HCC)   Elevated troponin   Acute on chronic diastolic CHF (congestive heart failure) (HCC)   AKI (acute kidney injury) ([place])   Type 2 diabetes mellitus with diabetic polyneuropathy, with long-term current use of insulin (HCC)   Hypertensive urgency   1. Acute on chronic respiratory failure could be secondary to CHF exacerbation. -Patient with acute onset of shortness of breath associated with BP 260/ 120 and O2 saturation 70% after EMS arrived.   He is found to have patchy opacities on CXR concerning for edema and/or infection but denies productive cough or fevers,   He does not have leukocytosis, and clinical scenario more suggestive of  acute CHF, possibly flash pulmonary edema  - He was given Lasix and antibiotics in ED. -Continue IV Lasix 40 mg twice daily.  Transition to oral Lasix today. -Patient reports significant improvement in breathing  -He is back to his baseline oxygen requirement on 4 L sats 97% - procalcitonin 0.20 , no leukocytosis, chest x-ray unremarkable.  No need for antibiotics.  2. Acute on chronic diastolic CHF  - Presents with acute-onset SOB, saturating 70% with EMS, and is found to have severely elevated BP with elevated BNP and suspected edema on CXR  - EF was 60-65% in July 2021 with grade II diastolic dysfunction, hx of HOCM but no obstruction with valsalva during most recent echo  - He was given Lasix 40 mg IV in ED and is diuresing  - Continue diuresis with Lasix 40 mg IV q12h, monitor wt and I/Os  - Changed to Lasix 40 p.o. twice daily. - His weight is down from 81 kg to 75 kg couple of days  3. Hypertensive urgency  - SBP 200/90 on admission, improving with diuresis and NTG   - Continue diuresis, continue nitroglycerin infusion,  -His home medications resumed that included clonidine, hydralazine, Coreg, Lasix. -Nitroglycerin infusion discontinued. -Blood pressure is improving  4. Insulin-dependent DM  - A1c was 6.0% in May 2021  - Continue CBG checks and insulin    5. AKI on CKD IV  - SCr is 2.86 on admission, up from apparent baseline closer to 2.3-2.5  - Renally-dose medications, monitor closely while diuresis    6. Elevated troponin  - HS troponin is mildly  elevated and decreasing  - Likely related to acute CHF and severe HTN rather than ACS  - Continue cardiac monitoring for now and treat underlying CHF and HTN     DVT prophylaxis: heparin sq Code Status: Full code Family Communication:  No family at bed side. Disposition Plan:    Status is: Inpatient  Remains inpatient appropriate because:Inpatient level of care appropriate due to severity of illness   Dispo:  The patient is from: Home              Anticipated d/c is to: Home              Anticipated d/c date is: 1 day              Patient currently is not medically stable to d/c.   Consultants:   Cardiology  Procedures: Antimicrobials: Anti-infectives (From admission, onward)   Start     Dose/Rate Route Frequency Ordered Stop   05/01/2020 0415  cefTRIAXone (ROCEPHIN) 1 g in sodium chloride 0.9 % 100 mL IVPB        1 g 200 mL/hr over 30 Minutes Intravenous  Once 05/14/2020 0402 05/07/2020 0447   04/27/2020 0415  azithromycin (ZITHROMAX) 500 mg in sodium chloride 0.9 % 250 mL IVPB        500 mg 250 mL/hr over 60 Minutes Intravenous  Once 05/15/2020 0402 05/18/2020 0612     Subjective: Patient was seen and examined at bedside.  Overnight events noted.  Patient feels much better, He asks when he can be discharged. Patient is down to nasal cannula 4 L which is his baseline oxygen requirement.  Saturating 97%.  Objective: Vitals:   05/12/20 0344 05/12/20 0445 05/12/20 0457 05/12/20 0743  BP: (!) 175/65 (!) 175/63 (!) 175/65 (!) 166/98  Pulse: 65 64 66 68  Resp: 16 (!) 21 (!) 22 18  Temp:   98.5 F (36.9 C) 98.2 F (36.8 C)  TempSrc:   Oral Oral  SpO2: 98% 98% 98% 97%  Weight:   75 kg   Height:        Intake/Output Summary (Last 24 hours) at 05/12/2020 1427 Last data filed at 05/12/2020 0838 Gross per 24 hour  Intake 1323.26 ml  Output 2175 ml  Net -851.74 ml   Filed Weights   05/16/2020 0319 05/12/20 0457  Weight: 81.5 kg 75 kg    Examination:  General exam: Appears calm and comfortable  Respiratory system: Clear to auscultation. Respiratory effort normal. Cardiovascular system: S1 & S2 heard, RRR. No JVD, murmurs, rubs, gallops or clicks. No pedal edema. Gastrointestinal system: Abdomen is nondistended, soft and nontender. No organomegaly or masses felt. Normal bowel sounds heard. Central nervous system: Alert and oriented. No focal neurological deficits. Extremities: No clubbing,  no cyanosis, no edema Skin: No rashes, lesions or ulcers Psychiatry: Judgement and insight appear normal. Mood & affect appropriate.     Data Reviewed: I have personally reviewed following labs and imaging studies  CBC: Recent Labs  Lab 05/21/2020 0300 05/12/20 0249  WBC 9.1 5.0  NEUTROABS 6.7  --   HGB 11.0* 9.6*  HCT 36.5* 31.1*  MCV 103.4* 100.6*  PLT 247 272   Basic Metabolic Panel: Recent Labs  Lab 05/09/20 1120 05/09/2020 0300 05/12/20 0249  NA 138 139 139  K 5.0 4.7 4.0  CL 106 104 105  CO2 25 25 27   GLUCOSE 178* 197* 137*  BUN 41* 44* 42*  CREATININE 2.54* 2.86* 2.54*  CALCIUM 8.9 8.9  8.8*   GFR: Estimated Creatinine Clearance: 25.8 mL/min (A) (by C-G formula based on SCr of 2.54 mg/dL (H)). Liver Function Tests: No results for input(s): AST, ALT, ALKPHOS, BILITOT, PROT, ALBUMIN in the last 168 hours. No results for input(s): LIPASE, AMYLASE in the last 168 hours. No results for input(s): AMMONIA in the last 168 hours. Coagulation Profile: No results for input(s): INR, PROTIME in the last 168 hours. Cardiac Enzymes: No results for input(s): CKTOTAL, CKMB, CKMBINDEX, TROPONINI in the last 168 hours. BNP (last 3 results) No results for input(s): PROBNP in the last 8760 hours. HbA1C: Recent Labs    05/12/20 0249  HGBA1C 6.2*   CBG: Recent Labs  Lab 05/17/2020 1217 05/13/2020 1607 05/04/2020 2237 05/12/20 0816 05/12/20 1113  GLUCAP 123* 120* 152* 133* 159*   Lipid Profile: No results for input(s): CHOL, HDL, LDLCALC, TRIG, CHOLHDL, LDLDIRECT in the last 72 hours. Thyroid Function Tests: No results for input(s): TSH, T4TOTAL, FREET4, T3FREE, THYROIDAB in the last 72 hours. Anemia Panel: No results for input(s): VITAMINB12, FOLATE, FERRITIN, TIBC, IRON, RETICCTPCT in the last 72 hours. Sepsis Labs: Recent Labs  Lab 04/24/2020 0548 05/12/20 0249  PROCALCITON 0.20 0.33    Recent Results (from the past 240 hour(s))  Respiratory Panel by RT PCR (Flu  A&B, Covid) - Nasopharyngeal Swab     Status: None   Collection Time: 05/19/2020  3:00 AM   Specimen: Nasopharyngeal Swab  Result Value Ref Range Status   SARS Coronavirus 2 by RT PCR NEGATIVE NEGATIVE Final    Comment: (NOTE) SARS-CoV-2 target nucleic acids are NOT DETECTED.  The SARS-CoV-2 RNA is generally detectable in upper respiratoy specimens during the acute phase of infection. The lowest concentration of SARS-CoV-2 viral copies this assay can detect is 131 copies/mL. A negative result does not preclude SARS-Cov-2 infection and should not be used as the sole basis for treatment or other patient management decisions. A negative result may occur with  improper specimen collection/handling, submission of specimen other than nasopharyngeal swab, presence of viral mutation(s) within the areas targeted by this assay, and inadequate number of viral copies (<131 copies/mL). A negative result must be combined with clinical observations, patient history, and epidemiological information. The expected result is Negative.  Fact Sheet for Patients:  PinkCheek.be  Fact Sheet for Healthcare Providers:  GravelBags.it  This test is no t yet approved or cleared by the [place] FDA and  has been authorized for detection and/or diagnosis of SARS-CoV-2 by FDA under an Emergency Use Authorization (EUA). This EUA will remain  in effect (meaning this test can be used) for the duration of the COVID-19 declaration under Section 564(b)(1) of the Act, 21 U.S.C. section 360bbb-3(b)(1), unless the authorization is terminated or revoked sooner.     Influenza A by PCR NEGATIVE NEGATIVE Final   Influenza B by PCR NEGATIVE NEGATIVE Final    Comment: (NOTE) The Xpert Xpress SARS-CoV-2/FLU/RSV assay is intended as an aid in  the diagnosis of influenza from Nasopharyngeal swab specimens and  should not be used as a sole basis for treatment. Nasal  washings and  aspirates are unacceptable for Xpert Xpress SARS-CoV-2/FLU/RSV  testing.  Fact Sheet for Patients: PinkCheek.be  Fact Sheet for Healthcare Providers: GravelBags.it  This test is not yet approved or cleared by the [place] FDA and  has been authorized for detection and/or diagnosis of SARS-CoV-2 by  FDA under an Emergency Use Authorization (EUA). This EUA will remain  in effect (meaning this test can  be used) for the duration of the  Covid-19 declaration under Section 564(b)(1) of the Act, 21  U.S.C. section 360bbb-3(b)(1), unless the authorization is  terminated or revoked. Performed at [facility], [place] [address] [place], [place] [address]     Radiology Studies: DG Chest Port 1 View  Result Date: 05/18/2020 CLINICAL DATA:  Shortness of breath EXAM: PORTABLE CHEST 1 VIEW COMPARISON:  Radiograph 02/18/2020 FINDINGS: Patchy heterogeneous opacity is present most focally in the right mid lung and in the left perihilar and retrocardiac spaces. Several more nodular opacities are present in the left lung and right lung base as well. No pneumothorax or visible effusion. Diffuse interstitial opacities present as well. Some bandlike in streaky opacities likely reflect subsegmental atelectasis or scarring. Cardiomegaly is similar to comparisons with a calcified aorta. The osseous structures appear diffusely demineralized which may limit detection of small or nondisplaced fractures. No acute osseous or soft tissue abnormality. Degenerative changes are present in the imaged spine and shoulders. Telemetry leads overlie the chest. IMPRESSION: 1. Patchy heterogeneous opacity most focally in the right mid lung and in the left perihilar and retrocardiac spaces, could reflect infection or edema. 2. More nodular opacities are seen elsewhere in the lungs, consider further evaluation with CT. Electronically Signed   By: Lovena Le M.D.   On: 05/19/2020 03:48    Scheduled Meds: . [START ON 05/13/2020] amLODipine  10 mg Oral Daily  . atorvastatin  20 mg Oral Daily  . carvedilol  25 mg Oral BID WC  . cloNIDine  0.2 mg Oral Q8H  . furosemide  40 mg Oral BID  . heparin  5,000 Units Subcutaneous Q8H  . hydrALAZINE  100 mg Oral Q8H  . insulin aspart  0-5 Units Subcutaneous QHS  . insulin aspart  0-9 Units Subcutaneous TID WC  . insulin glargine  9 Units Subcutaneous QHS  . mouth rinse  15 mL Mouth Rinse BID  . sodium chloride flush  3 mL Intravenous Q12H   Continuous Infusions: . sodium chloride    . magnesium sulfate bolus IVPB       LOS: 1 day    Time spent: 35 mins.    Shawna Clamp, MD [facility]   If 7PM-7AM, please contact night-coverage

## 2020-05-12 NOTE — Progress Notes (Addendum)
Progress Note  Patient Name: Robert Chaney Date of Encounter: 05/12/2020  Primary Cardiologist: Ena Dawley, MD  Subjective   Patient asking repeatedly when he can go home. Denies CP or SOB. Reports taking all of his medicines as prescribed.  Inpatient Medications    Scheduled Meds: . amLODipine  5 mg Oral Daily  . carvedilol  25 mg Oral BID WC  . furosemide  40 mg Intravenous BID  . heparin  5,000 Units Subcutaneous Q8H  . hydrALAZINE  100 mg Oral Q8H  . insulin aspart  0-5 Units Subcutaneous QHS  . insulin aspart  0-9 Units Subcutaneous TID WC  . insulin glargine  9 Units Subcutaneous QHS  . mouth rinse  15 mL Mouth Rinse BID  . sodium chloride flush  3 mL Intravenous Q12H   Continuous Infusions: . sodium chloride    . nitroGLYCERIN 160 mcg/min (05/12/20 0311)   PRN Meds: sodium chloride, acetaminophen, ondansetron (ZOFRAN) IV, sodium chloride flush   Vital Signs    Vitals:   05/12/20 0242 05/12/20 0344 05/12/20 0445 05/12/20 0457  BP: (!) 178/140 (!) 175/65 (!) 175/63 (!) 175/65  Pulse: 66 65 64 66  Resp: (!) 27 16 (!) 21 (!) 22  Temp:    98.5 F (36.9 C)  TempSrc:    Oral  SpO2: 96% 98% 98% 98%  Weight:    75 kg  Height:        Intake/Output Summary (Last 24 hours) at 05/12/2020 0733 Last data filed at 05/12/2020 0500 Gross per 24 hour  Intake 957.26 ml  Output 1775 ml  Net -817.74 ml   Last 3 Weights 05/12/2020 05/12/2020 05/09/2020  Weight (lbs) 165 lb 5.5 oz 179 lb 10.8 oz 179 lb 9.6 oz  Weight (kg) 75 kg 81.5 kg 81.466 kg     Telemetry    NSR - Personally Reviewed  Physical Exam   GEN: No acute distress. Some smell of urine noted. HEENT: Normocephalic, atraumatic, sclera non-icteric. Neck: No JVD or bruits. Cardiac: RRR, 2/6 SEM, no rubs or gallops.  Radials/DP/PT 1+ and equal bilaterally.  Respiratory: Clear to auscultation bilaterally. Breathing is unlabored. GI: Soft, nontender, non-distended, BS +x 4. MS: no  deformity. Extremities: No clubbing or cyanosis. No edema. Distal pedal pulses are 2+ and equal bilaterally. Neuro:  AAOx3. Follows commands. Psych:  Responds to questions appropriately with a normal affect.  Labs    High Sensitivity Troponin:   Recent Labs  Lab 05/19/2020 0300 05/17/2020 0548  TROPONINIHS 72* 66*      Cardiac EnzymesNo results for input(s): TROPONINI in the last 168 hours. No results for input(s): TROPIPOC in the last 168 hours.   Chemistry Recent Labs  Lab 05/09/20 1120 05/23/2020 0300 05/12/20 0249  NA 138 139 139  K 5.0 4.7 4.0  CL 106 104 105  CO2 _0 GLUCOSE 178* 197* 137*  BUN 41* 44* 42*  CREATININE 2.54* 2.86* 2.54*  CALCIUM 8.9 8.9 8.8*  GFRNONAA 24* 23* 26*  GFRAA 28*  --   --   ANIONGAP  --  10 7     Hematology Recent Labs  Lab 05/08/2020 0300 05/12/20 0249  WBC 9.1 5.0  RBC 3.53* 3.09*  HGB 11.0* 9.6*  HCT 36.5* 31.1*  MCV 103.4* 100.6*  MCH 31.2 31.1  MCHC 30.1 30.9  RDW 14.1 13.9  PLT 247 202    BNP Recent Labs  Lab 05/04/2020 0300  BNP 817.1*     DDimer No  results for input(s): DDIMER in the last 168 hours.   Radiology    DG Chest Port 1 View  Result Date: 05/05/2020 CLINICAL DATA:  Shortness of breath EXAM: PORTABLE CHEST 1 VIEW COMPARISON:  Radiograph 02/18/2020 FINDINGS: Patchy heterogeneous opacity is present most focally in the right mid lung and in the left perihilar and retrocardiac spaces. Several more nodular opacities are present in the left lung and right lung base as well. No pneumothorax or visible effusion. Diffuse interstitial opacities present as well. Some bandlike in streaky opacities likely reflect subsegmental atelectasis or scarring. Cardiomegaly is similar to comparisons with a calcified aorta. The osseous structures appear diffusely demineralized which may limit detection of small or nondisplaced fractures. No acute osseous or soft tissue abnormality. Degenerative changes are present in the imaged  spine and shoulders. Telemetry leads overlie the chest. IMPRESSION: 1. Patchy heterogeneous opacity most focally in the right mid lung and in the left perihilar and retrocardiac spaces, could reflect infection or edema. 2. More nodular opacities are seen elsewhere in the lungs, consider further evaluation with CT. Electronically Signed   By: Lovena Le M.D.   On: 05/22/2020 03:48    Cardiac Studies   Limited echo 02/19/20 FINDINGS  Left Ventricle: Limited study with Valsalva does not show any significant  LVOT gradient to suggest obstruction.   Rozann Lesches MD   2D echo 12/2019 1. Severe asymmetric LVH of the basal septum up to 1.8 cm. Prior history  of HOCM. No chordal or mitral valve SAM. No significant LVOT gradient on  this study (HR 63 bpm). No provocative maneuvers performed. Left  ventricular ejection fraction, by  estimation, is 60 to 65%. The left ventricle has normal function. The left  ventricle has no regional wall motion abnormalities. There is severe  asymmetric left ventricular hypertrophy of the basal-septal segment. Left  ventricular diastolic parameters are  consistent with Grade II diastolic dysfunction (pseudonormalization).  Elevated left atrial pressure.  2. Right ventricular systolic function is normal. The right ventricular  size is normal. Tricuspid regurgitation signal is inadequate for assessing  PA pressure.  3. The mitral valve is grossly normal. Mild mitral valve regurgitation.  No evidence of mitral stenosis.  4. The aortic valve is tricuspid. Aortic valve regurgitation is not  visualized. No aortic stenosis is present.  5. There is mild dilatation of the ascending aorta measuring 41 mm.  6. The inferior vena cava is normal in size with greater than 50%  respiratory variability, suggesting right atrial pressure of 3 mmHg.   Comparison(s): No significant change from prior study. No significant LVOT  gradient on this study.   CMRI  10/2016 IMPRESSION: 1. Normal left ventricular size with severe left ventricular hypertrophy with basal septal thickness of 21 mm and normal systolic function (LVEF = 62%). There are no regional wall motion abnormalities.  There are diffuse patchy late gadolinium enhancement in the basal anterior, anteroseptal, inferior, inferolateral, mid anterior, inferior, inferolateral and apical inferior walls.  2.  Normal right ventricular size, thickness and systolic volume.  3.  Mild mitral and tricuspid regurgitation.  Collectively, these findings are consistent with hypertrophic cardiomyopathy with high risk feature - presence of LGE in the 8 myocardial segments.  Ena Dawley  Patient Profile     69 y.o. male with hypertrophic cardiomyopathy, HTN, HLD, chronic diastolic heart failure, CKD stage 4, diabetes, OSA on CPAP, on chronic 2L O2 (although is unsure why/no tobacco/copd hx), brief NSVT/SVT/PACs/PACs by prior event monitor, mild dilation of  ascending aorta 01/2020. Previously seen by EP and not felt to meet criteria for needing ICD for HOCM. I met him in 2019 and it was very apparent that medication compliance was a challenge as the patient frequently did not know what he was taking. He appeared unkempt, smelled of urinary incontinence, and it was challenging to definitively discern what he was actually taking at home at that time.  He has more recently been followed closely [facility] for management of his elevated BP.  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  Plasma normetanephrines were mildly elevated and renin/aldo were normal. Attempts were made in September to try and update his CPAP materials but they were unable to reach the patient. Presented to hospital with SOB/CP x 3 days with hypoxia in the 70s and BP 260/120.   Assessment & Plan    1. Accelerated hypertension - unclear trigger for dramatic increase, concerns for prior  noncompliance/medication confusion are certainly a factor - current BP 166/98 on amlodipine 39m, carvedilol 252mBID, Lasix 4017mV BID, hydralazine 100m59mhr, NTG gtt - on clonidine, isosorbide, Cardura at home with higher dose carvedilol than he is on here - will resume home clonidine to avoid rebound HTN, otherwise will discuss plan for resumption of others with MD  - would benefit from consideration of home health  2. Acute on chronic diastolic CHF in the context of HOCM - clinically appears improved - consider transition from IV Lasix back to oral Lasix today - ? Repeat echo to ensure no change in LVEF - will defer to MD  3. Elevated troponin - relatively low/flat at 72->66 with chest pain in the context of severe HTN - chest pain has resolved with improved BP - can consider arranging outpatient nuclear stress test after follow-up depending on how he does - allergic to ASA, not convinced he would require Plavix based on low level - resume statin  4. Worsening macrocytic anemia - further per IM (Hgb 11-9.6)  5. CKD stage IV (baseline appears around 2.2-2.6) - Cr 2.54 today, improved from yesterday   6. OSA on CPAP  - will need outpatient assistance for his supplies as our office has had trouble coordinating as outpatient, so will consult care manager as inpatient to assist   7. Chronic respiratory failure - pt states he uses 4L/O2 chronically and that [facility] up. Actually appears this was started in 12/2019 after admission for acute hypoxic respiratory failure felt due to CHF (VQ negative) - recommend re-eval to clarify home O2 needs prior to DC  8. PAD - 12/2019 Dopplershowed no evidence of DVT in the bilateral lower extremity, but noted femoral arterial occlusion, flow reconstitutes at popliteal artery - discussions with [facility] recommended no further recommendations as patient is completely asymptomatic; also SMA stenosis   - aspirin  allergic - resume statin, recommend OP f/u lipids   For questions or updates, please contact [facility] consult [email or web address] for contact info under Cardiology/STEMI.  Signed, DaynCharlie Pitter-C 05/12/2020, 7:33 AM    Patient seen and examined and agree with DaynMelina Copa-C.  Feeling much better. Blood pressure still elevated but improved from prior. Net negative 817 on lasix 40mg34mBID. Wt down significantly--likely not accurate  Exam: GEN: No acute distress.   Neck: No JVD Cardiac: RRR, no murmurs, rubs, or gallops.  Respiratory: Clear to auscultation bilaterally. GI: Soft, nontender, non-distended  MS:  No edema; No deformity. Neuro:  Nonfocal  Psych: Normal affect   Plan: -Resume home clonidine -Increase amlodipine to 68m daily -Continue coreg 225mBID, hydralazine 10055mID -Wean off nitro gtt today; once off--will resume home imdur  -Volume status significantly improved--transition to lasix PO -Can pursue functional testing as out-patient on non-emergent basis as do not suspect ACS -Needs home health aide due to concern about medication compliance  Hopefully home tomorrow vs Sunday pending blood pressure and volume status.  HeaGwyndolyn KaufmanD

## 2020-05-12 NOTE — TOC Initial Note (Signed)
Transition of Care [facility]) - Initial/Assessment Note    Patient Details  Name: Robert Chaney MRN: [id number] Date of Birth: 1950-12-01  Transition [facility] Care [facility]) CM/SW Contact:    Bethena Roys, RN Phone Number: 05/12/2020, 5:50 PM  Clinical Narrative: Risk for readmission assessment completed. Patient presented for shortness of breath. Patient is from home alone. Patient has a primary care provider and he uses the [facility]. Case Manager received a consult for CPAP supplies needed to be updated. Case Manager spoke with patient and he states he has called the office and the supplies they brought to his apartment were not compatible. Case Manager reached out to [employer] and we will contact office and patient. No further needs from the Case Manager at this time.            Expected Discharge Plan: Home/Self Care Barriers to Discharge: No Barriers Identified   Patient Goals and CMS Choice Patient states their goals for this hospitalization and ongoing recovery are:: to return home   Choice offered to / list presented to : NA  Expected Discharge Plan and Services Expected Discharge Plan: Home/Self Care In-house Referral: NA Discharge Planning Services: CM Consult Post Acute Care Choice: NA Living arrangements for the past 2 months: Apartment HH Arranged: NA     Prior Living Arrangements/Services Living arrangements for the past 2 months: Apartment Lives with:: Self Patient language and need for interpreter reviewed:: Yes Do you feel safe going back to the place where you live?: Yes      Need for Family Participation in Patient Care: No (Comment) Care giver support system in place?: No (comment)   Criminal Activity/Legal Involvement Pertinent to Current Situation/Hospitalization: No - Comment as needed   Emotional Assessment Appearance:: Appears stated age Attitude/Demeanor/Rapport: Engaged Affect (typically observed):  Appropriate Orientation: : Oriented to Place, Oriented to Self, Oriented to  Time, Oriented to Situation Alcohol / Substance Use: Not Applicable Psych Involvement: No (comment)  Admission diagnosis:  Acute pulmonary edema (HCC) [J81.0] Acute respiratory failure with hypoxia ([place]) [J96.01] AKI (acute kidney injury) ([place]) [N17.9] Acute on chronic respiratory failure with hypoxia ([place]) [J96.21] Community acquired pneumonia of right middle lobe of lung [J18.9] Patient Active Problem List   Diagnosis Date Noted  . Acute on chronic respiratory failure with hypoxia ([place]) 04/24/2020  . Hypertensive urgency 02/18/2020  . Acute exacerbation of CHF (congestive heart failure) ([place]) 01/11/2020  . Acute respiratory failure ([place]) 01/11/2020  . Type 2 diabetes mellitus with retinopathy, with long-term current use of insulin ([place]) 07/16/2019  . Type 2 diabetes mellitus with stage 3a chronic kidney disease, with long-term current use of insulin ([place]) 07/16/2019  . Type 2 diabetes mellitus with diabetic polyneuropathy, with long-term current use of insulin ([place]) 07/16/2019  . OSA (obstructive sleep apnea) 03/30/2019  . Hyperglycemia 07/31/2017  . Near syncope 07/31/2017  . Acute-on-chronic kidney injury ([place]) 07/31/2017  . AKI (acute kidney injury) ([place]) 06/09/2017  . Hypertensive heart disease 11/07/2016  . Hypertrophic cardiomyopathy ([place]) 07/26/2015  . Dyslipidemia 05/19/2013  . Diabetic hyperosmolar non-ketotic state ([place]) 07/19/2011  . DM (diabetes mellitus) with complications ([place]) 63/06/6008  . Stage 4 chronic kidney disease ([place]) 07/19/2011  . Elevated troponin 07/19/2011  . Prolonged QT interval 07/19/2011  . Hyperkalemia 07/19/2011  . Volume depletion 07/19/2011  . Acute on chronic diastolic CHF (congestive heart failure) ([place]) 07/19/2011  . Essential hypertension 07/19/2011  . Elevated CPK 07/19/2011  . Hypercholesterolemia 02/20/2011  . Benign  hypertensive heart disease without heart  failure 02/20/2011   PCP:  [name], MD Pharmacy:   [place], [place] [address] Phone: [phone] Fax: [phone]  [facility] [place], [place] [address] [place] [address] Phone: [phone] Fax: [phone]   Readmission Risk Interventions Readmission Risk Prevention Plan 05/12/2020  Transportation Screening Complete  PCP or Specialist Appt within 3-5 Days Complete  HRI or Home Care Consult Complete  Social Work Consult for [place] Planning/Counseling Complete  Palliative Care Screening Not Applicable  Medication Review Press photographer) Complete  Some recent data might be hidden

## 2020-05-12 NOTE — Evaluation (Signed)
Physical Therapy Evaluation/Discharge Patient Details Name: Robert Chaney MRN: [id number] DOB: 29-Mar-1951 Today's Date: 05/12/2020   History of Present Illness  Pt is a 70 y/o M presenting to ED on 11/18 with SOB and was found to have SpO2 of 70% and 260/120 BP. PMH includes AMS, CKD stage III, DM, anemia, chronic diastolic CHF, HTN, and tachycardia.  Clinical Impression  Pt had impulsivity with transfers, but no LOB. Pt was able to ambulate supervision for helping with line and lead management (which won't be present at d/c) secondary to slight impulsivity but was with WNL gait patterns. Pt was educated on the importance and role of O2, the difference between CPAP, BiPAP, and nasal cannula O2. Pt was also educated on what supplemental O2 does for his body and his need for O2 during ambulation but that at rest, he doesn't need supplemental O2. Pt states that no one ever educated him why he needs supplemental O2 or what it was used for. Pt doesn't need skilled PT in acute care or at d/c secondary to his independence with functional mobility. Pt is in agreeance with no further need for acute care PT and follow up PT at d/c.  4L SpO2 sitting EOB: 96% 1L SpO2 sitting EOB and walking: 90-95% Room air resting SpO2 sitting after tx: 92-93%    Follow Up Recommendations No PT follow up    Equipment Recommendations       Recommendations for Other Services       Precautions / Restrictions Precautions Precaution Comments: impulsivity with transfers Restrictions Weight Bearing Restrictions: No      Mobility  Bed Mobility Overal bed mobility: Independent             General bed mobility comments: pt sleeping on arrival and got to EOB independently    Transfers Overall transfer level: Needs assistance   Transfers: Sit to/from Stand Sit to Stand: Supervision         General transfer comment: pt immediately got up when PT entered the room. pt instructed to sit back down but then got  up x3 more times without PT; pt supervision for safety with lines and leads that won't be present at d/c  Ambulation/Gait Ambulation/Gait assistance: Supervision Gait Distance (Feet): 240 Feet   Gait Pattern/deviations: WFL(Within Functional Limits)   Gait velocity interpretation: >2.62 ft/sec, indicative of community ambulatory General Gait Details: pt supervision for line and lead management  Stairs            Wheelchair Mobility    Modified Rankin (Stroke Patients Only)       Balance Overall balance assessment: Independent                                           Pertinent Vitals/Pain Pain Assessment: Faces Faces Pain Scale: No hurt    Home Living Family/patient expects to be discharged to:: Private residence Living Arrangements: Alone Available Help at Discharge: Family;Friend(s);Available PRN/intermittently Type of Home: Apartment Home Access: Level entry     Home Layout: One level Home Equipment: None      Prior Function           Comments: likes to read, listen to music, walk; drives, but hires someone to cook in clean (not due to function)     Hand Dominance   Dominant Hand: Right    Extremity/Trunk Assessment  Communication   Communication: No difficulties  Cognition                                              General Comments      Exercises     Assessment/Plan    PT Assessment Patent does not need any further PT services  PT Problem List Decreased activity tolerance       PT Treatment Interventions      PT Goals (Current goals can be found in the Care Plan section)  Acute Rehab PT Goals Patient Stated Goal: to get back to walking PT Goal Formulation: With patient Time For Goal Achievement: 05/26/20 Potential to Achieve Goals: Good    Frequency     Barriers to discharge        Co-evaluation               AM-PAC PT "6 Clicks" Mobility  Outcome  Measure Help needed turning from your back to your side while in a flat bed without using bedrails?: None Help needed moving from lying on your back to sitting on the side of a flat bed without using bedrails?: None Help needed moving to and from a bed to a chair (including a wheelchair)?: None Help needed standing up from a chair using your arms (e.g., wheelchair or bedside chair)?: None Help needed to walk in hospital room?: None Help needed climbing 3-5 steps with a railing? : A Little 6 Click Score: 23    End of Session Equipment Utilized During Treatment: Gait belt Activity Tolerance: Patient tolerated treatment well Patient left: in chair;with chair alarm set;with call bell/phone within reach   PT Visit Diagnosis: Difficulty in walking, not elsewhere classified (R26.2)    Time: 7209-4709 PT Time Calculation (min) (ACUTE ONLY): 28 min   Charges:   PT Evaluation $PT Eval Moderate Complexity: 1 Mod PT Treatments $Gait Training: 8-22 mins        Robert Chaney, SPT [phone]  Robert Chaney 05/12/2020, 1:05 PM

## 2020-05-12 NOTE — Evaluation (Signed)
Occupational Therapy Evaluation and Discharge Patient Details Name: Robert Chaney MRN: [id number] DOB: Jun 05, 1951 Today's Date: 05/12/2020    History of Present Illness Pt is a 69 y/o M presenting to ED on 11/18 with SOB and was found to have SpO2 of 70% and 260/120 BP. PMH includes AMS, CKD stage III, DM, anemia, chronic diastolic CHF, HTN, and tachycardia.   Clinical Impression   Pt maintaining Sp02>95% on 4L 02 during session. He is overall functioning at a supervision level for safety/lines. Educated in pursed lip breathing. No further OT needs.     Follow Up Recommendations  No OT follow up    Equipment Recommendations  None recommended by OT    Recommendations for Other Services       Precautions / Restrictions Precautions Precaution Comments: on 2L 02 at baseline per H & P Restrictions Weight Bearing Restrictions: No      Mobility Bed Mobility Overal bed mobility: Independent             General bed mobility comments: pt in chair    Transfers Overall transfer level: Needs assistance Equipment used: None Transfers: Sit to/from Stand Sit to Stand: Supervision         General transfer comment: supervision for lines    Balance Overall balance assessment: Independent                                         ADL either performed or assessed with clinical judgement   ADL                                         General ADL Comments: Overall functioning at a supervision level for safety and lines. Educated in pursed lip breathing during exertion.      Vision Patient Visual Report: No change from baseline Additional Comments: hx of cataract sx     Perception     Praxis      Pertinent Vitals/Pain Pain Assessment: No/denies pain Faces Pain Scale: No hurt     Hand Dominance Right   Extremity/Trunk Assessment Upper Extremity Assessment Upper Extremity Assessment: Overall WFL for tasks assessed   Lower  Extremity Assessment Lower Extremity Assessment: Defer to PT evaluation   Cervical / Trunk Assessment Cervical / Trunk Assessment: Normal   Communication Communication Communication: HOH   Cognition Arousal/Alertness: Awake/alert Behavior During Therapy: WFL for tasks assessed/performed Overall Cognitive Status: Within Functional Limits for tasks assessed                                     General Comments       Exercises     Shoulder Instructions      Home Living Family/patient expects to be discharged to:: Private residence Living Arrangements: Alone Available Help at Discharge: Family;Friend(s);Available PRN/intermittently Type of Home: Apartment Home Access: Level entry     Home Layout: One level     Bathroom Shower/Tub: Teacher, early years/pre: Standard     Home Equipment: Other (comment) (02)   Additional Comments: prefers to stand to shower, wears 02 in shower      Prior Functioning/Environment Level of Independence: Independent        Comments: likes to  watch movies        OT Problem List:        OT Treatment/Interventions:      OT Goals(Current goals can be found in the care plan section) Acute Rehab OT Goals Patient Stated Goal: to get back to walking  OT Frequency:     Barriers to D/C:            Co-evaluation              AM-PAC OT "6 Clicks" Daily Activity     Outcome Measure Help from another person eating meals?: None Help from another person taking care of personal grooming?: None Help from another person toileting, which includes using toliet, bedpan, or urinal?: None Help from another person bathing (including washing, rinsing, drying)?: None Help from another person to put on and taking off regular upper body clothing?: None Help from another person to put on and taking off regular lower body clothing?: None 6 Click Score: 24   End of Session    Activity Tolerance: Patient tolerated  treatment well Patient left: in chair;with call bell/phone within reach;with chair alarm set  OT Visit Diagnosis: Other (comment) (decreased activity tolerance)                Time: 3545-6256 OT Time Calculation (min): 16 min Charges:  OT General Charges $OT Visit: 1 Visit OT Evaluation $OT Eval Low Complexity: 1 Low  [name], OTR/L Acute Rehabilitation Services Pager: [phone] Office: [phone]  Robert Chaney 05/12/2020, 3:07 PM

## 2020-05-13 LAB — CBC
HCT: 31.3 % — ABNORMAL LOW (ref 39.0–52.0)
Hemoglobin: 9.6 g/dL — ABNORMAL LOW (ref 13.0–17.0)
MCH: 30.4 pg (ref 26.0–34.0)
MCHC: 30.7 g/dL (ref 30.0–36.0)
MCV: 99.1 fL (ref 80.0–100.0)
Platelets: 195 10*3/uL (ref 150–400)
RBC: 3.16 MIL/uL — ABNORMAL LOW (ref 4.22–5.81)
RDW: 13.8 % (ref 11.5–15.5)
WBC: 4.2 10*3/uL (ref 4.0–10.5)
nRBC: 0 % (ref 0.0–0.2)

## 2020-05-13 LAB — BASIC METABOLIC PANEL
Anion gap: 8 (ref 5–15)
BUN: 46 mg/dL — ABNORMAL HIGH (ref 8–23)
CO2: 26 mmol/L (ref 22–32)
Calcium: 8.6 mg/dL — ABNORMAL LOW (ref 8.9–10.3)
Chloride: 102 mmol/L (ref 98–111)
Creatinine, Ser: 2.83 mg/dL — ABNORMAL HIGH (ref 0.61–1.24)
GFR, Estimated: 23 mL/min — ABNORMAL LOW (ref >60)
Glucose, Bld: 145 mg/dL — ABNORMAL HIGH (ref 70–99)
Potassium: 4.1 mmol/L (ref 3.5–5.1)
Sodium: 136 mmol/L (ref 135–145)

## 2020-05-13 LAB — PHOSPHORUS: Phosphorus: 4.5 mg/dL (ref 2.5–4.6)

## 2020-05-13 LAB — PROCALCITONIN: Procalcitonin: 0.47 ng/mL

## 2020-05-13 LAB — LIPID PANEL
Cholesterol: 127 mg/dL (ref 0–200)
HDL: 43 mg/dL (ref >40)
LDL Cholesterol: 63 mg/dL (ref 0–99)
Total CHOL/HDL Ratio: 3 RATIO
Triglycerides: 103 mg/dL (ref <150)
VLDL: 21 mg/dL (ref 0–40)

## 2020-05-13 LAB — MAGNESIUM: Magnesium: 2.6 mg/dL — ABNORMAL HIGH (ref 1.7–2.4)

## 2020-05-13 LAB — GLUCOSE, CAPILLARY: Glucose-Capillary: 126 mg/dL — ABNORMAL HIGH (ref 70–99)

## 2020-05-13 MED ORDER — FUROSEMIDE 40 MG PO TABS
40.0000 mg | ORAL_TABLET | Freq: Two times a day (BID) | ORAL | 1 refills | Status: DC
Start: 2020-05-13 — End: 2021-03-22

## 2020-05-13 MED ORDER — AMLODIPINE BESYLATE 10 MG PO TABS: 10.0000 mg | ORAL_TABLET | Freq: Every day | ORAL | 1 refills | Status: AC

## 2020-05-13 NOTE — Discharge Instructions (Signed)
Advised to follow-up with primary care physician in 1 week. Advised to take medications as prescribed. Advised to continue oxygen @ 4L/ min as advised. Advised to continue CPAP at night.

## 2020-05-13 NOTE — Progress Notes (Signed)
Progress Note  Patient Name: Robert Chaney Date of Encounter: 05/13/2020  Primary Cardiologist: Ena Dawley, MD  Subjective   Patient asking repeatedly when he can go home. Denies CP or SOB. Reports taking all of his medicines as prescribed.  Inpatient Medications    Scheduled Meds: . amLODipine  10 mg Oral Daily  . atorvastatin  20 mg Oral Daily  . carvedilol  25 mg Oral BID WC  . cloNIDine  0.2 mg Oral Q8H  . furosemide  40 mg Oral BID  . heparin  5,000 Units Subcutaneous Q8H  . hydrALAZINE  100 mg Oral Q8H  . insulin aspart  0-5 Units Subcutaneous QHS  . insulin aspart  0-9 Units Subcutaneous TID WC  . insulin glargine  9 Units Subcutaneous QHS  . mouth rinse  15 mL Mouth Rinse BID  . sodium chloride flush  3 mL Intravenous Q12H   Continuous Infusions: . sodium chloride     PRN Meds: sodium chloride, acetaminophen, ondansetron (ZOFRAN) IV, sodium chloride flush   Vital Signs    Vitals:   05/13/20 0444 05/13/20 0601 05/13/20 0813 05/13/20 0933  BP: 140/65 (!) 153/73 133/66   Pulse: 65  61   Resp:  13 16   Temp: 97.6 F (36.4 C)  98 F (36.7 C)   TempSrc: Axillary  Oral   SpO2: 97%  99% 99%  Weight: 75.9 kg     Height:        Intake/Output Summary (Last 24 hours) at 05/13/2020 1025 Last data filed at 05/13/2020 0600 Gross per 24 hour  Intake 243 ml  Output 1150 ml  Net -907 ml   Last 3 Weights 05/13/2020 05/12/2020 05/15/2020  Weight (lbs) 167 lb 4.2 oz 165 lb 5.5 oz 179 lb 10.8 oz  Weight (kg) 75.87 kg 75 kg 81.5 kg     Telemetry    NSR - Personally Reviewed  Physical Exam   Black male in no distress Lungs clear SEM  No edema Abdomen soft no renal bruit   Labs    High Sensitivity Troponin:   Recent Labs  Lab 05/23/2020 0300 05/16/2020 0548  TROPONINIHS 72* 66*      Cardiac EnzymesNo results for input(s): TROPONINI in the last 168 hours. No results for input(s): TROPIPOC in the last 168 hours.   Chemistry Recent Labs  Lab  05/09/20 1120 05/09/20 1120 05/02/2020 0300 05/12/20 0249 05/13/20 0256  NA 138  --  139 139 136  K 5.0   < > 4.7 4.0 4.1  CL 106   < > 104 105 102  CO2 25   < > 25 27 26   GLUCOSE 178*  --  197* 137* 145*  BUN 41*  --  44* 42* 46*  CREATININE 2.54*   < > 2.86* 2.54* 2.83*  CALCIUM 8.9   < > 8.9 8.8* 8.6*  GFRNONAA 24*  --  23* 26* 23*  GFRAA 28*  --   --   --   --   ANIONGAP  --   --  10 7 8    < > = values in this interval not displayed.     Hematology Recent Labs  Lab 05/07/2020 0300 05/12/20 0249 05/13/20 0256  WBC 9.1 5.0 4.2  RBC 3.53* 3.09* 3.16*  HGB 11.0* 9.6* 9.6*  HCT 36.5* 31.1* 31.3*  MCV 103.4* 100.6* 99.1  MCH 31.2 31.1 30.4  MCHC 30.1 30.9 30.7  RDW 14.1 13.9 13.8  PLT 247 202 195  BNP Recent Labs  Lab 05/05/2020 0300  BNP 817.1*     DDimer No results for input(s): DDIMER in the last 168 hours.   Radiology    No results found.  Cardiac Studies   Limited echo 02/19/20 FINDINGS  Left Ventricle: Limited study with Valsalva does not show any significant  LVOT gradient to suggest obstruction.   Rozann Lesches MD   2D echo 12/2019 1. Severe asymmetric LVH of the basal septum up to 1.8 cm. Prior history  of HOCM. No chordal or mitral valve SAM. No significant LVOT gradient on  this study (HR 63 bpm). No provocative maneuvers performed. Left  ventricular ejection fraction, by  estimation, is 60 to 65%. The left ventricle has normal function. The left  ventricle has no regional wall motion abnormalities. There is severe  asymmetric left ventricular hypertrophy of the basal-septal segment. Left  ventricular diastolic parameters are  consistent with Grade II diastolic dysfunction (pseudonormalization).  Elevated left atrial pressure.  2. Right ventricular systolic function is normal. The right ventricular  size is normal. Tricuspid regurgitation signal is inadequate for assessing  PA pressure.  3. The mitral valve is grossly normal. Mild  mitral valve regurgitation.  No evidence of mitral stenosis.  4. The aortic valve is tricuspid. Aortic valve regurgitation is not  visualized. No aortic stenosis is present.  5. There is mild dilatation of the ascending aorta measuring 41 mm.  6. The inferior vena cava is normal in size with greater than 50%  respiratory variability, suggesting right atrial pressure of 3 mmHg.   Comparison(s): No significant change from prior study. No significant LVOT  gradient on this study.   CMRI 10/2016 IMPRESSION: 1. Normal left ventricular size with severe left ventricular hypertrophy with basal septal thickness of 21 mm and normal systolic function (LVEF = 62%). There are no regional wall motion abnormalities.  There are diffuse patchy late gadolinium enhancement in the basal anterior, anteroseptal, inferior, inferolateral, mid anterior, inferior, inferolateral and apical inferior walls.  2.  Normal right ventricular size, thickness and systolic volume.  3.  Mild mitral and tricuspid regurgitation.  Collectively, these findings are consistent with hypertrophic cardiomyopathy with high risk feature - presence of LGE in the 8 myocardial segments.  Ena Dawley  Patient Profile     69 y.o. male with hypertrophic cardiomyopathy, HTN, HLD, chronic diastolic heart failure, CKD stage 4, diabetes, OSA on CPAP, on chronic 2L O2 (although is unsure why/no tobacco/copd hx), brief NSVT/SVT/PACs/PACs by prior event monitor, mild dilation of ascending aorta 01/2020. Previously seen by EP and not felt to meet criteria for needing ICD for HOCM. Seen for accelerated HTN   Assessment & Plan    1. Accelerated hypertension - improved continue current meds ok for d/c   2. Acute on chronic diastolic CHF in the context of HOCM - clinically appears improved on oral lasix   3. Elevated troponin - relatively low/flat at 72->66 no chest pain outpatient myovue   4. Worsening macrocytic anemia -  further per IM (Hgb 11-9.6)  5. CKD stage IV (baseline appears around 2.2-2.6) - Cr 2.8  Today    6. OSA on CPAP  - will need outpatient assistance for his supplies as our office has had trouble coordinating as outpatient, so will consult care manager as inpatient to assist   7. Chronic respiratory failure - pt states he uses 4L/O2 chronically should have referral to [facility] on d/c     For questions or updates, please  contact [place] Please consult [email or web address] for contact info under Cardiology/STEMI.  Signed, Jenkins Rouge, MD 05/13/2020, 10:25 AM

## 2020-05-13 NOTE — Discharge Summary (Signed)
Physician Discharge Summary  Robert Chaney UUV:[id number] DOB: 1951/03/31 DOA: 05/21/2020  PCP: [name], MD  Admit date: 05/10/2020.  Discharge date: 05/13/2020.  Admitted From:  Home.  Disposition:   Home.  Recommendations for Outpatient Follow-up:  1. Follow up with PCP in 1-2 weeks. 2. Please obtain BMP/CBC in one week. 3.   Advised to take medications as prescribed.  His blood pressure medication has been adjusted. 4.   Advised to continue oxygen @ 4L/ min as advised. Advised to continue CPAP at night. 5.   Advised to follow-up with [facility] pulmonology for chronic respiratory failure. 6.   Patient was advised to follow-up with cardiology to have outpatient Myoview.  Home Health: RN  (Medication management) Equipment/Devices: Oxygen at 4 L/min  Discharge Condition: Stable. CODE STATUS:Full code Diet recommendation: Heart Healthy   Brief Summary: This 69 years old male with medical history significant for insulin-dependent diabetes mellitus, hypertension, chronic diastolic CHF, hypertrophic obstructive cardiomyopathy, chronic respiratory failure on 4 L of supplemental oxygen at baseline presented inthe emergency department for the evaluation of acute onset of shortness of breath. He was hypoxic saturating 70% on room air and hypertensive 210/120 after EMS arrived. Patient was placed on BiPAP after he arrived in the ED. He was found to have a blood pressure of 200 /120. He was started on nitroglycerin infusion.  Hospital course:  Patient was admitted for acute on chronic respiratory failure could be secondary toflashpulmonary edema. He was given Lasix and antibiotics in the ED. IV Lasix was continued.  Patient was placed on BiPAP as needed and at night. Cardiology consulted for elevated troponin and hypertensive urgency.  Elevated troponins could be secondary to demand ischemia from hypertensive urgency. Patient home medications resumed, Patient is continued on  nitroglycerin drip. Blood pressure slightly improving. Patient was tried to wean down on nasal cannula but he became hypoxic,placed back on BiPAP.    With aggressive diuresis he is weaned down to O2 4 L via nasal cannula sats 97%.  Blood pressure improved,  nitroglycerin infusion discontinued. Patient has lost so far 14 pounds with aggressive diuresis.  He feels significantly improved and wants to be discharged.  His renal functions remained at his baseline.  Patient has home oxygen and CPAP to use it at night.  Patient blood pressure medications were adjusted and patient was counseled in detail about the compliance of medications and follow-ups.  Patient is cleared from cardiology to be discharged.  Patient will follow up with [facility] pulmonology for chronic respiratory failure and cardiology for outpatient Myoview.  Patient is being discharged home  He was managed for below problems  Discharge Diagnoses:  Principal Problem:   Acute on chronic respiratory failure with hypoxia (HCC) Active Problems:   Stage 4 chronic kidney disease (HCC)   Elevated troponin   Acute on chronic diastolic CHF (congestive heart failure) (HCC)   AKI (acute kidney injury) ([place])   Type 2 diabetes mellitus with diabetic polyneuropathy, with long-term current use of insulin (HCC)   Hypertensive urgency  1.Acute on chronic respiratory failure could be secondary to CHF exacerbation. -Patient with acute onset of shortness of breath associated with BP 260/ 120 and O2 saturation 70% after EMS arrived.   He is found to have patchy opacities on CXR concerning for edema and/or infection but denies productive cough or fevers,   He does not have leukocytosis, and clinical scenario more suggestive of acute CHF, possibly flash pulmonary edema -He was given Lasix and antibiotics in ED. -  Continue IV Lasix 40 mg twice daily.  Transition to oral Lasix today. -Patient reports significant improvement in breathing  -He is back to  his baseline oxygen requirement on 4 L sats 97% -procalcitonin 0.20 , no leukocytosis, chest x-ray unremarkable.  No need for antibiotics.  2.Acute on chronic diastolic CHF -Presents with acute-onset SOB, saturating 70% with EMS, and is found to have severely elevated BP with elevated BNP and suspected edema on CXR -EF was 60-65% in July 2021 with grade II diastolic dysfunction, hx of HOCM but no obstruction with valsalva during most recent echo -He was given Lasix 40 mg IV in ED and is diuresing -Continue diuresis with Lasix 40 mg IV q12h, monitor wt and I/Os - Changed to Lasix 40 p.o. twice daily. - His weight is down from 81 kg to 75 kg couple of days  3.Hypertensive urgency -SBP 200/90 on admission, improving with diuresis and NTG -Continue diuresis, continue nitroglycerin infusion,  -His home medications resumed that included clonidine, hydralazine, Coreg, Lasix. -Nitroglycerin infusion discontinued. -Blood pressure is  improved  4.Insulin-dependent DM -A1c was 6.0% in May 2021 -Continue CBG checks and insulin  5.AKI on CKD IV -SCr is 2.86 on admission, up from apparent baseline closer to 2.3-2.5 -Renally-dose medications, monitor closely while diuresis  6.Elevated troponin -HS troponin is mildly elevated and decreasing -Likely related to acute CHF and severe HTN rather than ACS -Continue cardiac monitoring for now and treat underlying CHF and HTN   Discharge Instructions  Discharge Instructions    Call MD for:  difficulty breathing, headache or visual disturbances   Complete by: As directed    Call MD for:  persistant dizziness or light-headedness   Complete by: As directed    Call MD for:  persistant nausea and vomiting   Complete by: As directed    Diet - low sodium heart healthy   Complete by: As directed    Diet Carb Modified   Complete by: As directed    Increase activity slowly   Complete by: As directed       Allergies as of 05/13/2020      Reactions   Aspirin Itching      Medication List    STOP taking these medications   doxazosin 2 MG tablet Commonly known as: CARDURA   Insulin Pen Needle 31G X 5 MM Misc Commonly known as: B-D UF III MINI PEN NEEDLES   OneTouch Verio test strip Generic drug: glucose blood     TAKE these medications   amLODipine 10 MG tablet Commonly known as: NORVASC Take 1 tablet (10 mg total) by mouth daily. What changed:   medication strength  See the new instructions.   aspirin EC 81 MG tablet Take 1 tablet (81 mg total) by mouth daily. Swallow whole.   atorvastatin 20 MG tablet Commonly known as: LIPITOR Take 1 tablet (20 mg total) by mouth daily.   brimonidine 0.2 % ophthalmic solution Commonly known as: ALPHAGAN Place 1 drop into the right eye 2 (two) times daily.   carvedilol 25 MG tablet Commonly known as: COREG Take 2 tablets (50 mg total) by mouth 2 (two) times daily.   cholecalciferol 25 MCG (1000 UNIT) tablet Commonly known as: VITAMIN D3 Take 1,000 Units by mouth daily.   cloNIDine 0.2 MG tablet Commonly known as: CATAPRES Take 1 tablet (0.2 mg total) by mouth 3 (three) times daily.   diclofenac Sodium 1 % Gel Commonly known as: VOLTAREN Apply 4 g topically 4 (four) times  daily as needed for pain.   furosemide 40 MG tablet Commonly known as: LASIX Take 1 tablet (40 mg total) by mouth 2 (two) times daily. What changed:   medication strength  how much to take  when to take this   hydrALAZINE 100 MG tablet Commonly known as: APRESOLINE Take 1 tablet (100 mg total) by mouth 3 (three) times daily.   insulin aspart 100 UNIT/ML injection Commonly known as: novoLOG Inject 0-6 Units into the skin 3 (three) times daily with meals. Sliding scale   isosorbide mononitrate 60 MG 24 hr tablet Commonly known as: IMDUR Take 60 mg by mouth daily.   latanoprost 0.005 % ophthalmic solution Commonly known as: XALATAN Place 1  drop into the left eye at bedtime.   nitroGLYCERIN 0.4 MG SL tablet Commonly known as: NITROSTAT DISSOLVE ONE TABLET UNDER THE TONGUE EVERY 5 MINUTES AS NEEDED FOR CHEST PAIN. What changed: See the new instructions.   Toujeo Max SoloStar 300 UNIT/ML Sopn Generic drug: Insulin Glargine Inject 18 Units into the skin at bedtime.            Durable Medical Equipment  (From admission, onward)         Start     Ordered   05/13/20 1024  DME Oxygen  Once       Question Answer Comment  Length of Need Lifetime   Mode or (Route) Mask   Liters per Minute 4   Frequency Continuous (stationary and portable oxygen unit needed)   Oxygen conserving device Yes   Oxygen delivery system Gas      05/13/20 1024          Follow-up Information    [name], MD Follow up in 1 week(s).   Specialty: Internal Medicine Contact information: [address] [place] [address] [phone]        Dorothy Spark, MD .   Specialty: Cardiology Contact information: [address] [place] [address] [phone]        [facility] Follow up in 1 week(s).   Specialty: Pulmonology Contact information: [address] [place] [address] [phone]             Allergies  Allergen Reactions  . Aspirin Itching    Consultations: Cardiology  Procedures/Studies: DG Chest Port 1 View  Result Date: 04/28/2020 CLINICAL DATA:  Shortness of breath EXAM: PORTABLE CHEST 1 VIEW COMPARISON:  Radiograph 02/18/2020 FINDINGS: Patchy heterogeneous opacity is present most focally in the right mid lung and in the left perihilar and retrocardiac spaces. Several more nodular opacities are present in the left lung and right lung base as well. No pneumothorax or visible effusion. Diffuse interstitial opacities present as well. Some bandlike in streaky opacities likely reflect subsegmental atelectasis or scarring. Cardiomegaly is  similar to comparisons with a calcified aorta. The osseous structures appear diffusely demineralized which may limit detection of small or nondisplaced fractures. No acute osseous or soft tissue abnormality. Degenerative changes are present in the imaged spine and shoulders. Telemetry leads overlie the chest. IMPRESSION: 1. Patchy heterogeneous opacity most focally in the right mid lung and in the left perihilar and retrocardiac spaces, could reflect infection or edema. 2. More nodular opacities are seen elsewhere in the lungs, consider further evaluation with CT. Electronically Signed   By: Lovena Le M.D.   On: 05/23/2020 03:48   (Echo, Carotid, EGD, Colonoscopy, ERCP)    Subjective: Patient was seen and examined  at bedside.  Overnight events noted.  Patient reports feeling much better.  He has lost about 14 pounds since he is admitted.  He stated he had never felt that great in 8 years.  He wants to be discharged home.  Discharge Exam: Vitals:   05/13/20 0933 05/13/20 1057  BP:  (!) 120/58  Pulse:    Resp:    Temp:    SpO2: 99%    Vitals:   05/13/20 0601 05/13/20 0813 05/13/20 0933 05/13/20 1057  BP: (!) 153/73 133/66  (!) 120/58  Pulse:  61    Resp: 13 16    Temp:  98 F (36.7 C)    TempSrc:  Oral    SpO2:  99% 99%   Weight:      Height:        General: Pt is alert, awake, not in acute distress Cardiovascular: RRR, S1/S2 +, no rubs, no gallops Respiratory: CTA bilaterally, no wheezing, no rhonchi Abdominal: Soft, NT, ND, bowel sounds + Extremities: no edema, no cyanosis    The results of significant diagnostics from this hospitalization (including imaging, microbiology, ancillary and laboratory) are listed below for reference.     Microbiology: Recent Results (from the past 240 hour(s))  Respiratory Panel by RT PCR (Flu A&B, Covid) - Nasopharyngeal Swab     Status: None   Collection Time: 05/07/2020  3:00 AM   Specimen: Nasopharyngeal Swab  Result Value Ref Range  Status   SARS Coronavirus 2 by RT PCR NEGATIVE NEGATIVE Final    Comment: (NOTE) SARS-CoV-2 target nucleic acids are NOT DETECTED.  The SARS-CoV-2 RNA is generally detectable in upper respiratoy specimens during the acute phase of infection. The lowest concentration of SARS-CoV-2 viral copies this assay can detect is 131 copies/mL. A negative result does not preclude SARS-Cov-2 infection and should not be used as the sole basis for treatment or other patient management decisions. A negative result may occur with  improper specimen collection/handling, submission of specimen other than nasopharyngeal swab, presence of viral mutation(s) within the areas targeted by this assay, and inadequate number of viral copies (<131 copies/mL). A negative result must be combined with clinical observations, patient history, and epidemiological information. The expected result is Negative.  Fact Sheet for Patients:  PinkCheek.be  Fact Sheet for Healthcare Providers:  GravelBags.it  This test is no t yet approved or cleared by the [place] FDA and  has been authorized for detection and/or diagnosis of SARS-CoV-2 by FDA under an Emergency Use Authorization (EUA). This EUA will remain  in effect (meaning this test can be used) for the duration of the COVID-19 declaration under Section 564(b)(1) of the Act, 21 U.S.C. section 360bbb-3(b)(1), unless the authorization is terminated or revoked sooner.     Influenza A by PCR NEGATIVE NEGATIVE Final   Influenza B by PCR NEGATIVE NEGATIVE Final    Comment: (NOTE) The Xpert Xpress SARS-CoV-2/FLU/RSV assay is intended as an aid in  the diagnosis of influenza from Nasopharyngeal swab specimens and  should not be used as a sole basis for treatment. Nasal washings and  aspirates are unacceptable for Xpert Xpress SARS-CoV-2/FLU/RSV  testing.  Fact Sheet for  Patients: PinkCheek.be  Fact Sheet for Healthcare Providers: GravelBags.it  This test is not yet approved or cleared by the [place] FDA and  has been authorized for detection and/or diagnosis of SARS-CoV-2 by  FDA under an Emergency Use Authorization (EUA). This EUA will remain  in effect (meaning this test can be  used) for the duration of the  Covid-19 declaration under Section 564(b)(1) of the Act, 21  U.S.C. section 360bbb-3(b)(1), unless the authorization is  terminated or revoked. Performed at [facility], [place] [address], [place], [place] [address]      Labs: BNP (last 3 results) Recent Labs    01/11/20 0545 02/18/20 0641 04/26/2020 0300  BNP 628.4* 1,163.2* 841.6*   Basic Metabolic Panel: Recent Labs  Lab 05/09/20 1120 05/04/2020 0300 05/12/20 0249 05/13/20 0256  NA 138 139 139 136  K 5.0 4.7 4.0 4.1  CL 106 104 105 102  CO2 25 25 27 26   GLUCOSE 178* 197* 137* 145*  BUN 41* 44* 42* 46*  CREATININE 2.54* 2.86* 2.54* 2.83*  CALCIUM 8.9 8.9 8.8* 8.6*  MG  --   --   --  2.6*  PHOS  --   --   --  4.5   Liver Function Tests: No results for input(s): AST, ALT, ALKPHOS, BILITOT, PROT, ALBUMIN in the last 168 hours. No results for input(s): LIPASE, AMYLASE in the last 168 hours. No results for input(s): AMMONIA in the last 168 hours. CBC: Recent Labs  Lab 05/22/2020 0300 05/12/20 0249 05/13/20 0256  WBC 9.1 5.0 4.2  NEUTROABS 6.7  --   --   HGB 11.0* 9.6* 9.6*  HCT 36.5* 31.1* 31.3*  MCV 103.4* 100.6* 99.1  PLT 247 202 195   Cardiac Enzymes: No results for input(s): CKTOTAL, CKMB, CKMBINDEX, TROPONINI in the last 168 hours. BNP: Invalid input(s): POCBNP CBG: Recent Labs  Lab 05/12/20 1113 05/12/20 1559 05/12/20 1727 05/12/20 2044 05/13/20 0812  GLUCAP 159* 139* 137* 101* 126*   D-Dimer No results for input(s): DDIMER in the last 72 hours. Hgb A1c Recent Labs    05/12/20 0249   HGBA1C 6.2*   Lipid Profile Recent Labs    05/13/20 0256  CHOL 127  HDL 43  LDLCALC 63  TRIG 103  CHOLHDL 3.0   Thyroid function studies No results for input(s): TSH, T4TOTAL, T3FREE, THYROIDAB in the last 72 hours.  Invalid input(s): FREET3 Anemia work up No results for input(s): VITAMINB12, FOLATE, FERRITIN, TIBC, IRON, RETICCTPCT in the last 72 hours. Urinalysis    Component Value Date/Time   COLORURINE YELLOW 07/31/2017 [place] 07/31/2017 1840   LABSPEC 1.020 07/31/2017 1840   PHURINE 6.0 07/31/2017 1840   GLUCOSEU >=500 (A) 07/31/2017 1840   HGBUR NEGATIVE 07/31/2017 1840   BILIRUBINUR NEGATIVE 07/31/2017 1840   KETONESUR 5 (A) 07/31/2017 [place] 07/31/2017 1840   UROBILINOGEN 1.0 07/19/2011 1335   NITRITE POSITIVE (A) 07/31/2017 1840   LEUKOCYTESUR NEGATIVE 07/31/2017 1840   Sepsis Labs Invalid input(s): PROCALCITONIN,  WBC,  LACTICIDVEN Microbiology Recent Results (from the past 240 hour(s))  Respiratory Panel by RT PCR (Flu A&B, Covid) - Nasopharyngeal Swab     Status: None   Collection Time: 05/16/2020  3:00 AM   Specimen: Nasopharyngeal Swab  Result Value Ref Range Status   SARS Coronavirus 2 by RT PCR NEGATIVE NEGATIVE Final    Comment: (NOTE) SARS-CoV-2 target nucleic acids are NOT DETECTED.  The SARS-CoV-2 RNA is generally detectable in upper respiratoy specimens during the acute phase of infection. The lowest concentration of SARS-CoV-2 viral copies this assay can detect is 131 copies/mL. A negative result does not preclude SARS-Cov-2 infection and should not be used as the sole basis for treatment or other patient management decisions. A negative result may occur with  improper specimen collection/handling, submission of specimen other than nasopharyngeal swab, presence of viral mutation(s) within the areas targeted by this assay, and inadequate number of viral copies (<131 copies/mL). A negative result must be  combined with clinical observations, patient history, and epidemiological information. The expected result is Negative.  Fact Sheet for Patients:  PinkCheek.be  Fact Sheet for Healthcare Providers:  GravelBags.it  This test is no t yet approved or cleared by the [place] FDA and  has been authorized for detection and/or diagnosis of SARS-CoV-2 by FDA under an Emergency Use Authorization (EUA). This EUA will remain  in effect (meaning this test can be used) for the duration of the COVID-19 declaration under Section 564(b)(1) of the Act, 21 U.S.C. section 360bbb-3(b)(1), unless the authorization is terminated or revoked sooner.     Influenza A by PCR NEGATIVE NEGATIVE Final   Influenza B by PCR NEGATIVE NEGATIVE Final    Comment: (NOTE) The Xpert Xpress SARS-CoV-2/FLU/RSV assay is intended as an aid in  the diagnosis of influenza from Nasopharyngeal swab specimens and  should not be used as a sole basis for treatment. Nasal washings and  aspirates are unacceptable for Xpert Xpress SARS-CoV-2/FLU/RSV  testing.  Fact Sheet for Patients: PinkCheek.be  Fact Sheet for Healthcare Providers: GravelBags.it  This test is not yet approved or cleared by the [place] FDA and  has been authorized for detection and/or diagnosis of SARS-CoV-2 by  FDA under an Emergency Use Authorization (EUA). This EUA will remain  in effect (meaning this test can be used) for the duration of the  Covid-19 declaration under Section 564(b)(1) of the Act, 21  U.S.C. section 360bbb-3(b)(1), unless the authorization is  terminated or revoked. Performed at [facility], [place] [address], [place], [place] [address]      Time coordinating discharge: Over 30 minutes  SIGNED:   Shawna Clamp, MD  [facility] 05/13/2020, 12:30 PM Pager   If 7PM-7AM, please contact  night-coverage [email or web address]

## 2020-05-15 DIAGNOSIS — H401123 Primary open-angle glaucoma, left eye, severe stage: Secondary | ICD-10-CM | POA: Diagnosis not present

## 2020-05-15 DIAGNOSIS — H401112 Primary open-angle glaucoma, right eye, moderate stage: Secondary | ICD-10-CM | POA: Diagnosis not present

## 2020-05-16 DIAGNOSIS — I5032 Chronic diastolic (congestive) heart failure: Secondary | ICD-10-CM | POA: Diagnosis not present

## 2020-05-16 DIAGNOSIS — G4733 Obstructive sleep apnea (adult) (pediatric): Secondary | ICD-10-CM | POA: Diagnosis not present

## 2020-05-17 ENCOUNTER — Other Ambulatory Visit: Payer: Self-pay | Admitting: Cardiology

## 2020-05-17 DIAGNOSIS — I10 Essential (primary) hypertension: Secondary | ICD-10-CM

## 2020-05-22 DIAGNOSIS — I5032 Chronic diastolic (congestive) heart failure: Secondary | ICD-10-CM | POA: Diagnosis not present

## 2020-05-22 DIAGNOSIS — G4733 Obstructive sleep apnea (adult) (pediatric): Secondary | ICD-10-CM | POA: Diagnosis not present

## 2020-05-23 ENCOUNTER — Encounter (INDEPENDENT_AMBULATORY_CARE_PROVIDER_SITE_OTHER): Payer: Self-pay | Admitting: Ophthalmology

## 2020-05-24 DIAGNOSIS — 419620001 Death: Secondary | SNOMED CT | POA: Diagnosis not present

## 2020-05-24 DEATH — deceased

## 2020-05-26 ENCOUNTER — Ambulatory Visit: Payer: PPO | Admitting: Internal Medicine

## 2020-05-26 DIAGNOSIS — I129 Hypertensive chronic kidney disease with stage 1 through stage 4 chronic kidney disease, or unspecified chronic kidney disease: Secondary | ICD-10-CM | POA: Diagnosis not present

## 2020-05-26 DIAGNOSIS — N189 Chronic kidney disease, unspecified: Secondary | ICD-10-CM | POA: Diagnosis not present

## 2020-05-26 DIAGNOSIS — N184 Chronic kidney disease, stage 4 (severe): Secondary | ICD-10-CM | POA: Diagnosis not present

## 2020-05-26 DIAGNOSIS — I5030 Unspecified diastolic (congestive) heart failure: Secondary | ICD-10-CM | POA: Diagnosis not present

## 2020-05-26 DIAGNOSIS — N2581 Secondary hyperparathyroidism of renal origin: Secondary | ICD-10-CM | POA: Diagnosis not present

## 2020-05-26 DIAGNOSIS — D631 Anemia in chronic kidney disease: Secondary | ICD-10-CM | POA: Diagnosis not present

## 2020-06-15 ENCOUNTER — Ambulatory Visit: Payer: PPO

## 2020-06-15 DIAGNOSIS — I5032 Chronic diastolic (congestive) heart failure: Secondary | ICD-10-CM | POA: Diagnosis not present

## 2020-06-15 DIAGNOSIS — G4733 Obstructive sleep apnea (adult) (pediatric): Secondary | ICD-10-CM | POA: Diagnosis not present

## 2020-06-21 DIAGNOSIS — I5032 Chronic diastolic (congestive) heart failure: Secondary | ICD-10-CM | POA: Diagnosis not present

## 2020-06-21 DIAGNOSIS — G4733 Obstructive sleep apnea (adult) (pediatric): Secondary | ICD-10-CM | POA: Diagnosis not present

## 2020-06-27 ENCOUNTER — Ambulatory Visit (INDEPENDENT_AMBULATORY_CARE_PROVIDER_SITE_OTHER): Payer: PPO | Admitting: Pharmacist Clinician (PhC)/ Clinical Pharmacy Specialist

## 2020-06-27 ENCOUNTER — Other Ambulatory Visit: Payer: Self-pay

## 2020-06-27 DIAGNOSIS — I10 Essential (primary) hypertension: Secondary | ICD-10-CM | POA: Diagnosis not present

## 2020-06-27 NOTE — Patient Instructions (Signed)
Return for a a follow up appointment February 1 at 9 am  Check your blood pressure at home daily and keep record of the readings.  Take your BP meds as follows:  Continue with all current medications.   Bring all of your meds, your BP cuff and your record of home blood pressures to your next appointment.  Exercise as you're able, try to walk approximately 30 minutes per day.  Keep salt intake to a minimum, especially watch canned and prepared boxed foods.  Eat more fresh fruits and vegetables and fewer canned items.  Avoid eating in fast food restaurants.    HOW TO TAKE YOUR BLOOD PRESSURE: . Rest 5 minutes before taking your blood pressure. .  Don't smoke or drink caffeinated beverages for at least 30 minutes before. . Take your blood pressure before (not after) you eat. . Sit comfortably with your back supported and both feet on the floor (don't cross your legs). . Elevate your arm to heart level on a table or a desk. . Use the proper sized cuff. It should fit smoothly and snugly around your bare upper arm. There should be enough room to slip a fingertip under the cuff. The bottom edge of the cuff should be 1 inch above the crease of the elbow. . Ideally, take 3 measurements at one sitting and record the average.

## 2020-06-27 NOTE — Progress Notes (Signed)
06/28/2020 Robert Chaney [birth year] [id number]   HPI:  Robert Chaney is a 70 y.o. male patient of Dr Oval Linsey, with a PMH below who presents today for advanced hypertension clinic follow up.   He saw Dr. Oval Linsey on 9/23 to establish care in the hypertension clinic. At that time, his BP had mostly been in the 259D to 638V systolic at home. Hydralazine was increased to 100 mg TID, and Furosemide was increased to 80 mg daily due to worsening shortness of breath and fluid overload.  He was hospitalized 2 days after his last visit for acute on chronic respiratory failure, with O2 sats at 70%.  On discharge BP medications were adjusted.     Today he returns for follow up.  States feeling much better in last couple of weeks since discharge and no problems with his current medication regimen.    PMH: Hyperlipidemia Last lipid panel 2018 (LDL 82 at the time)- on atorvastatin 20 mg  T2DM 12/2019 A1c 6.0- on toujeo max solostar 18 units qhs, novolog 0-6 units tid sliding scale with meals  Chronic diastolic HF 10/6431 EF 29-51%- fluid overload on Furosemide 80 mg qd  Obstructive sleep apnea On CPAP- has been having trouble with his machine  CKD stage 4 02/21/20 eGFR 29    BP goal: <130/80 mmHg  Vitals . 05/09/20: 164/78 mmHg, pulse 63 bpm . 03/16/20: 180/80 mmHg, pulse 69 bpm. Did not take his BP medications prior to appt . 03/02/20: 150/76 mmHg, pulse 56 bpm  Current HTN Medications: amlodipine 10 mg qhs, carvedilol 50 mg bid, clonidine 0.2 mg TID, hydralazine 100 mg TID, furosemide 40 mg bid  Family Hx: brother had stroke 2 weeks ago, both parents had HTN and are deceased (mom was 32 yo, dad was in his 77s)  Social Hx: negative for smoking and alcohol use  Diet: He has someone cook for him and brings him meals at home with no added salt. Drinks coffee at least 3 times a day Physical activity: tries to go on walks, but he is on 4 L of O2 at home and often gets short of breath which limits his  ability to do physical activity  Home BP Readings:   Has 40 home readings (no dates, 2 times each day),  Systolic readings average 137 in the mornings, 132 in evenings.    Not sure about the accuracy of his home meter, as diastolic readings averaged 43 and 46 respectively, with many diastolic readings in the 88'C  Labs:   05/13/20:   Na 136, K 4.1, BUN 46, SCr 2.83, GFR 23 (hospital encounter)  02/21/20:   Na 138, K 4.2, BUN 39, SCr 2.46, eGFR 29.    Wt Readings from Last 3 Encounters:  06/27/20 170 lb (77.1 kg)  05/13/20 167 lb 4.2 oz (75.9 kg)  05/09/20 179 lb 9.6 oz (81.5 kg)   BP Readings from Last 3 Encounters:  06/27/20 (!) 162/70  05/13/20 (!) 120/58  05/09/20 (!) 164/78   Pulse Readings from Last 3 Encounters:  06/27/20 74  05/13/20 61  05/09/20 63    Current Outpatient Medications  Medication Sig Dispense Refill  . amLODipine (NORVASC) 10 MG tablet Take 1 tablet (10 mg total) by mouth daily. 30 tablet 1  . aspirin EC 81 MG tablet Take 1 tablet (81 mg total) by mouth daily. Swallow whole. 90 tablet 3  . brimonidine (ALPHAGAN) 0.2 % ophthalmic solution Place 1 drop into the right eye 2 (  two) times daily.    . carvedilol (COREG) 25 MG tablet Take 2 tablets (50 mg total) by mouth 2 (two) times daily. 120 tablet 0  . cholecalciferol (VITAMIN D3) 25 MCG (1000 UT) tablet Take 1,000 Units by mouth daily.    . cloNIDine (CATAPRES) 0.2 MG tablet Take 1 tablet (0.2 mg total) by mouth 3 (three) times daily. 90 tablet 0  . diclofenac Sodium (VOLTAREN) 1 % GEL Apply 4 g topically 4 (four) times daily as needed for pain.    . furosemide (LASIX) 40 MG tablet Take 1 tablet (40 mg total) by mouth 2 (two) times daily. 60 tablet 1  . hydrALAZINE (APRESOLINE) 100 MG tablet Take 1 tablet (100 mg total) by mouth 3 (three) times daily. 270 tablet 3  . insulin aspart (NOVOLOG) 100 UNIT/ML injection Inject 0-6 Units into the skin 3 (three) times daily with meals. Sliding scale    . Insulin  Glargine 300 UNIT/ML SOPN Inject 18 Units into the skin at bedtime.    . isosorbide mononitrate (IMDUR) 60 MG 24 hr tablet Take 60 mg by mouth daily.    Marland Kitchen latanoprost (XALATAN) 0.005 % ophthalmic solution Place 1 drop into the left eye at bedtime.    . nitroGLYCERIN (NITROSTAT) 0.4 MG SL tablet DISSOLVE ONE TABLET UNDER THE TONGUE EVERY 5 MINUTES AS NEEDED FOR CHEST PAIN. (Patient taking differently: Place 0.4 mg under the tongue every 5 (five) minutes as needed for chest pain.) 25 tablet 7  . atorvastatin (LIPITOR) 20 MG tablet Take 1 tablet (20 mg total) by mouth daily. 90 tablet 2   No current facility-administered medications for this visit.    Allergies  Allergen Reactions  . Aspirin Itching    Past Medical History:  Diagnosis Date  . Altered mental status    a. 05/2017 - adm with blurred vision, somnolence in setting of AKI and high blood sugar.  . Anemia   . CKD (chronic kidney disease), stage III ([place])   . Diabetes mellitus   . Diastolic CHF, chronic (HCC)    A.  03/2009 Echo: EF 60-65%, Gr II diast dysfxn  . Elevated troponin    a. 2013 - troponin 1.4. b. 2019 - troponin 0.32; neg nuc 07/2017.  . Glaucoma   . Hyperlipidemia    HYPERCHOLESTEROLEMIA  . Hypertension    MARKED LEFT VENTRICULAR HYPERTROPHY BY PREVIOUS ECHOCARDIOGRAM--HE HAS HYPERDYNAMIC LEFT VENTRICULAR SYSTOLIC FUNCTION AND HAS IMPAIRED RELAXATION BY ECHO  . Hypertrophic cardiomyopathy ([place])   . NSVT (nonsustained ventricular tachycardia) ([place])   . Premature atrial contractions   . PVC's (premature ventricular contractions)   . SVT (supraventricular tachycardia) (HCC)     Blood pressure (!) 162/70, pulse 74, height '5\' 8"'  (1.727 m), weight 170 lb (77.1 kg).  Essential hypertension Patient with essential hypertension, not at goal in office, but home readings indicate better control.  Will have him continue with current regimen and have him return for follow up in 1 month.  Because of the extremely low  diastolic readings indicated in his log, have asked that he bring home BP meter to office at next visit so we can determine its accuracy.  Patient agreeable with plan.   Tommy Medal PharmD CPP [facility] [address] [place], [place] [address] [phone]

## 2020-06-28 NOTE — Assessment & Plan Note (Signed)
Patient with essential hypertension, not at goal in office, but home readings indicate better control.  Will have him continue with current regimen and have him return for follow up in 1 month.  Because of the extremely low diastolic readings indicated in his log, have asked that he bring home BP meter to office at next visit so we can determine its accuracy.  Patient agreeable with plan.

## 2020-06-29 ENCOUNTER — Ambulatory Visit: Payer: PPO | Admitting: Cardiology

## 2020-06-29 ENCOUNTER — Encounter: Payer: Self-pay | Admitting: Cardiology

## 2020-06-29 ENCOUNTER — Other Ambulatory Visit: Payer: Self-pay

## 2020-06-29 VITALS — BP 162/78 | HR 71 | Ht 69.6 in | Wt 173.0 lb

## 2020-06-29 DIAGNOSIS — I110 Hypertensive heart disease with heart failure: Secondary | ICD-10-CM | POA: Diagnosis not present

## 2020-06-29 DIAGNOSIS — I130 Hypertensive heart and chronic kidney disease with heart failure and stage 1 through stage 4 chronic kidney disease, or unspecified chronic kidney disease: Secondary | ICD-10-CM | POA: Diagnosis not present

## 2020-06-29 DIAGNOSIS — I10 Essential (primary) hypertension: Secondary | ICD-10-CM

## 2020-06-29 NOTE — Progress Notes (Signed)
Cardiology Office Note    Date:  06/29/2020   ID:  Robert Chaney, DOB June 14, 1951, MRN [id number]  PCP:  [name], MD  Cardiologist:  Ena Dawley, MD  Hypertension clinic: Dr. Oval Linsey  Chief Complaint: Follow-up for hypertension  History of Present Illness:   Robert Chaney is a 70 y.o. male with hypertrophic cardiomyopathy, chronic diastolic heart failure, chronic kidney disease stage IV, hypertension followed in hypertension clinic, hyperlipidemia, obstructive sleep apnea on CPAP, and obesity seen for hospital follow up. Echocardiographic evaluation 2017 was suspicious for HCM. He underwent cMRI confirming HCM with a basal septum of 21 mm patchy LGE over repeated assessments has had variability of the presence of of an outflow gradient. No hx of syncope. Low risk stress test February 2019. Echocardiogram July 2021 with LV function of 60 to 42%, grade 2 diastolic dysfunction. No significant LVOT gradient on  this study (HR 63 bpm).   Admitted 01/2019 with acute diastolic CHF/pulmonary edema of uncertain etiology. Restarted home lasix. His home dose of carvedilol was increased and BP remains elevated. increased clonidine to 0.2 mg tid.  Given his LE edema,  avoided increasing amlodipine if possible.   Renal artery doppler with mild stenosis on R and none on the left.  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 Repeat limited echo on 02/19/2020 showed no significant LVOT gradient.  He was evaluated in the hypertension clinic in September 2021 with negative work-up: Sleep Apnea: Continue CPAP Renal artery stenosis: Mild R 01/2020 Hyperaldosteronism:: Negative 01/2020 Hyper/hypothyroidism: TSH normal 12/2019 Pheochromocytoma: Mildly positive serum normetanephrines 01/2020 Cushing's syndrome: Cushingoid facies, central obesity, proximal muscle weakness, and ecchymoses, adrenal incidentaloma (cortisol) Coarctation of the aorta: (testing not indicated)   The patient was readmitted on  May 13, 2020 for acute on chronic respiratory failure secondary acute on chronic diastolic CHF secondary to hypertensive urgency.  He had troponin elevation that was considered to be demand ischemia from hypertensive urgency.  He is coming today and states that he feels significantly better, blood pressures at home in 1 30-1 40 mmHg range, denies any orthopnea proximal nocturnal dyspnea.  Lower extremity edema has significantly improved.  She follows with nephrology as well.  He has been compliant with his medications.  Denies any chest pain.  Past Medical History:  Diagnosis Date  . Altered mental status    a. 05/2017 - adm with blurred vision, somnolence in setting of AKI and high blood sugar.  . Anemia   . CKD (chronic kidney disease), stage III ([place])   . Diabetes mellitus   . Diastolic CHF, chronic (HCC)    A.  03/2009 Echo: EF 60-65%, Gr II diast dysfxn  . Elevated troponin    a. 2013 - troponin 1.4. b. 2019 - troponin 0.32; neg nuc 07/2017.  . Glaucoma   . Hyperlipidemia    HYPERCHOLESTEROLEMIA  . Hypertension    MARKED LEFT VENTRICULAR HYPERTROPHY BY PREVIOUS ECHOCARDIOGRAM--HE HAS HYPERDYNAMIC LEFT VENTRICULAR SYSTOLIC FUNCTION AND HAS IMPAIRED RELAXATION BY ECHO  . Hypertrophic cardiomyopathy ([place])   . NSVT (nonsustained ventricular tachycardia) ([place])   . Premature atrial contractions   . PVC's (premature ventricular contractions)   . SVT (supraventricular tachycardia) (HCC)     Past Surgical History:  Procedure Laterality Date  . NO PAST SURGERIES      Current Medications: Prior to Admission medications   Medication Sig Start Date End Date Taking? Authorizing Provider  amLODipine (NORVASC) 5 MG tablet Take 1 tablet (5 mg total)  by mouth at bedtime. 11/26/19   Skeet Latch, MD  atorvastatin (LIPITOR) 20 MG tablet Take 1 tablet (20 mg total) by mouth daily. 03/16/18   Dorothy Spark, MD  brimonidine (ALPHAGAN) 0.2 % ophthalmic solution Place 1 drop into the  right eye 2 (two) times daily. 10/06/19   [provider]  carvedilol (COREG) 25 MG tablet Take 2 tablets (50 mg total) by mouth 2 (two) times daily. 02/21/20 03/22/20  Dessa Phi, DO  cholecalciferol (VITAMIN D3) 25 MCG (1000 UT) tablet Take 1,000 Units by mouth daily.    [provider]  cloNIDine (CATAPRES) 0.2 MG tablet Take 1 tablet (0.2 mg total) by mouth 3 (three) times daily. 02/21/20 03/22/20  Dessa Phi, DO  diclofenac Sodium (VOLTAREN) 1 % GEL Apply 4 g topically 4 (four) times daily as needed for pain. 02/07/20   [provider]  furosemide (LASIX) 20 MG tablet Take 2 tablets (40 mg total) by mouth daily. 01/16/20   Alma Friendly, MD  glucose blood (ONETOUCH VERIO) test strip Use as instructed to test blood sugar 3 times daily E11.65 03/12/19   Shamleffer, Melanie Crazier, MD  hydrALAZINE (APRESOLINE) 100 MG tablet Take 1 tablet (100 mg total) by mouth 2 (two) times daily. 09/14/19   Imogene Burn, PA-C  insulin aspart (NOVOLOG) 100 UNIT/ML injection Inject 0-6 Units into the skin 3 (three) times daily with meals. Sliding scale 09/02/19   [provider]  Insulin Glargine (TOUJEO MAX SOLOSTAR) 300 UNIT/ML SOPN Inject 18 Units into the skin at bedtime.    [provider]  Insulin Pen Needle (B-D UF III MINI PEN NEEDLES) 31G X 5 MM MISC Four times daily 06/30/18   Shamleffer, Melanie Crazier, MD  latanoprost (XALATAN) 0.005 % ophthalmic solution Place 1 drop into the left eye at bedtime. 10/06/19   [provider]  nitroGLYCERIN (NITROSTAT) 0.4 MG SL tablet Place 1 tablet (0.4 mg total) under the tongue every 5 (five) minutes as needed for chest pain. 03/29/16   Dorothy Spark, MD    Allergies:   Aspirin   Social History   Socioeconomic History  . Marital status: Legally Separated    Spouse name: Not on file  . Number of children: Not on file  . Years of education: Not on file  . Highest education level: Not on file   Occupational History  . Occupation: retired    Fish farm manager: [employer]    Comment: At [employer]  . Smoking status: Never Smoker  . Smokeless tobacco: Never Used  Vaping Use  . Vaping Use: Never used  Substance and Sexual Activity  . Alcohol use: No  . Drug use: No  . Sexual activity: Never  Other Topics Concern  . Not on file  Social History Narrative   Originally from [place].    Social Determinants of Health   Financial Resource Strain: Not on file  Food Insecurity: Not on file  Transportation Needs: Not on file  Physical Activity: Not on file  Stress: Not on file  Social Connections: Not on file     Family History:  The patient's family history includes Diabetes in his brother and brother; Hypertension in his brother, father, and mother; Stroke in his father.   ROS:   Please see the history of present illness.    ROS All other systems reviewed and are negative.   PHYSICAL EXAM:   VS:  BP (!) 162/78   Pulse 71  Ht 5' 9.6" (1.768 m)   Wt 173 lb (78.5 kg)   SpO2 95%   BMI 25.11 kg/m    GEN: Well nourished, well developed, in no acute distress  HEENT: normal  Neck: no JVD, carotid bruits, or masses Cardiac: RRR; no murmurs, rubs, or gallops, mild bilateral lower extremity edema  Respiratory:  clear to auscultation bilaterally, normal work of breathing GI: soft, nontender, nondistended, + BS MS: no deformity or atrophy  Skin: warm and dry, no rash Neuro:  Alert and Oriented x 3, Strength and sensation are intact Psych: euthymic mood, full affect  Wt Readings from Last 3 Encounters:  06/29/20 173 lb (78.5 kg)  06/27/20 170 lb (77.1 kg)  05/13/20 167 lb 4.2 oz (75.9 kg)      Studies/Labs Reviewed:   EKG:  EKG is not ordered today.    Recent Labs: 01/12/2020: TSH 2.415 02/18/2020: ALT 16 05/10/2020: B Natriuretic Peptide 817.1 05/13/2020: BUN 46; Creatinine, Ser 2.83; Hemoglobin 9.6; Magnesium 2.6; Platelets 195; Potassium  4.1; Sodium 136   Lipid Panel    Component Value Date/Time   CHOL 127 05/13/2020 0256   CHOL 157 11/07/2016 0922   TRIG 103 05/13/2020 0256   HDL 43 05/13/2020 0256   HDL 59 11/07/2016 0922   CHOLHDL 3.0 05/13/2020 0256   VLDL 21 05/13/2020 0256   LDLCALC 63 05/13/2020 0256   LDLCALC 82 11/07/2016 0922    Additional studies/ records that were reviewed today include:   Limited Echocardiogram: 02/19/20 FINDINGS  Left Ventricle: Limited study with Valsalva does not show any significant  LVOT gradient to suggest obstruction.   Renal artery [place] 02/21/20: Renal:    Right: 1-59% stenosis of the right renal artery. Abnormal right     Resistive Index. Normal size right kidney.  Left: No evidence of left renal artery stenosis. Abnormal left     Resisitve Index. Normal size of left kidney.  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     ASSESSMENT & PLAN:    1. Hypertensive heart disease -Blood pressure improved since last hospitalization, blood pressure at home in 130 2040 mmHg range.  We will continue same management.  2.  Chronic diastolic heart failure -Volume status stable, improved lower extremity edema on Lasix 40 mg p.o. twice daily, -Continue Lasix at current dose  3.  Chronic kidney disease stage IV -Followed by Dr. Carolin Sicks  4.  Dyspnea on exertion -This is chronic and related to hypertension, negative stress test in 2019, EKG shows new negative T waves in the lateral leads however he feels significantly better and and only gets short of breath on strenuous exertion.  If he develops new symptoms we will consider repeating a stress test however he is a poor candidate for catheter coronary CT given stage IV CKD.  5. OSA - Compliant with CPAP  Medication Adjustments/Labs and Tests Ordered: Current medicines are reviewed at length with the patient today.  Concerns regarding medicines are outlined above.  Medication changes, Labs and  Tests ordered today are listed in the Patient Instructions below. Patient Instructions  Medication Instructions:   Your physician recommends that you continue on your current medications as directed. Please refer to the Current Medication list given to you today.  *If you need a refill on your cardiac medications before your next appointment, please call your pharmacy*   Follow-Up: At [facility], you and your health needs are our priority.  As part of our continuing mission to  provide you with exceptional heart care, we have created designated Provider Care Teams.  These Care Teams include your primary Cardiologist (physician) and Advanced Practice Providers (APPs -  Physician Assistants and Nurse Practitioners) who all work together to provide you with the care you need, when you need it.  We recommend signing up for the patient portal called "MyChart".  Sign up information is provided on this After Visit Summary.  MyChart is used to connect with patients for Virtual Visits (Telemedicine).  Patients are able to view lab/test results, encounter notes, upcoming appointments, etc.  Non-urgent messages can be sent to your provider as well.   To learn more about what you can do with MyChart, go to [email or web address].    Your next appointment:   6 month(s)  The format for your next appointment:   In Person  Provider:   Ena Dawley, MD       Signed, Ena Dawley, MD  06/29/2020 11:31 AM    [address], [place], [place]  [address] Phone: [phone]; Fax: [phone]

## 2020-06-29 NOTE — Patient Instructions (Signed)

## 2020-07-07 ENCOUNTER — Other Ambulatory Visit: Payer: Self-pay

## 2020-07-07 ENCOUNTER — Encounter: Payer: Self-pay | Admitting: Internal Medicine

## 2020-07-07 ENCOUNTER — Ambulatory Visit: Payer: PPO | Admitting: Internal Medicine

## 2020-07-07 ENCOUNTER — Telehealth: Payer: Self-pay | Admitting: Internal Medicine

## 2020-07-07 VITALS — BP 140/64 | HR 84 | Resp 18 | Ht 69.0 in | Wt 180.2 lb

## 2020-07-07 DIAGNOSIS — Z794 Long term (current) use of insulin: Secondary | ICD-10-CM | POA: Diagnosis not present

## 2020-07-07 DIAGNOSIS — E11319 Type 2 diabetes mellitus with unspecified diabetic retinopathy without macular edema: Secondary | ICD-10-CM | POA: Diagnosis not present

## 2020-07-07 DIAGNOSIS — E1142 Type 2 diabetes mellitus with diabetic polyneuropathy: Secondary | ICD-10-CM | POA: Diagnosis not present

## 2020-07-07 DIAGNOSIS — N1831 Chronic kidney disease, stage 3a: Secondary | ICD-10-CM

## 2020-07-07 DIAGNOSIS — E1122 Type 2 diabetes mellitus with diabetic chronic kidney disease: Secondary | ICD-10-CM | POA: Diagnosis not present

## 2020-07-07 NOTE — Progress Notes (Signed)
Name: Robert Chaney  Age/ Sex: 70 y.o., male   MRN/ DOB: [id number], Jul 13, 1950     PCP: Robert Ebbs, MD   Reason for Endocrinology Evaluation: Type 2 Diabetes Mellitus  Initial Endocrine Consultative Visit: 06/30/2018    PATIENT IDENTIFIER: Robert Chaney is a 70 y.o. male with a past medical history of T2DM, HTN, hypertrophic cardiomyopathy, and CKD III. The patient has followed with Endocrinology clinic since 06/30/2018 for consultative assistance with management of his diabetes.  DIABETIC HISTORY:  Robert Chaney was diagnosed with T2DM Since 2014, started insulin therapy at diagnosis. His hemoglobin A1c has ranged from 6.9%  in 2015, peaking at 13.0%  In 2019.  Linagliptin discontinued in 06/2018 due to cost    Saw Robert Chaney 08/2018 SUBJECTIVE:   During the last visit (07/16/2019): A1c 6.0 %We continued MDI regimen    Today (07/07/2020): Robert Chaney is here for 56-month follow-up on his diabetes management. He checks his blood sugars 3  times daily, preprandial. The patient has not had hypoglycemic episodes since the last clinic visit.    Pt had an admission for acute pulmonary edema    HOME DIABETES REGIMEN:   Toujeo 18 units daily - taking 25 units daily   Novolog 5 units TIDQAC- has been taking 4 units   If Pre-meal BG > 200 mg/dL, will add 2 units of Novolog to that meal     METER DOWNLOAD SUMMARY: 06/23/2020-07/07/2020 Average checks per day = 0.1 Overall Mean FS Glucose = 140  BG Ranges: Low = 97 High = 247   Hypoglycemic Events/30 Days: BG < 50 = 0 Episodes of symptomatic severe hypoglycemia = 0    DIABETIC COMPLICATIONS: Microvascular complications:   Neuropathy, CKD III, retinopathy on the right   Last eye exam: Completed 05/2019 (S/P right eye retinopathy  Macrovascular complications:    Denies: CAD, PVD, CVA  HISTORY:  Past Medical History:  Past Medical History:  Diagnosis Date  . Altered mental status    a. 05/2017 - adm with blurred  vision, somnolence in setting of AKI and high blood sugar.  . Anemia   . CKD (chronic kidney disease), stage III ([place])   . Diabetes mellitus   . Diastolic CHF, chronic (HCC)    A.  03/2009 Echo: EF 60-65%, Gr II diast dysfxn  . Elevated troponin    a. 2013 - troponin 1.4. b. 2019 - troponin 0.32; neg nuc 07/2017.  . Glaucoma   . Hyperlipidemia    HYPERCHOLESTEROLEMIA  . Hypertension    MARKED LEFT VENTRICULAR HYPERTROPHY BY PREVIOUS ECHOCARDIOGRAM--HE HAS HYPERDYNAMIC LEFT VENTRICULAR SYSTOLIC FUNCTION AND HAS IMPAIRED RELAXATION BY ECHO  . Hypertrophic cardiomyopathy ([place])   . NSVT (nonsustained ventricular tachycardia) ([place])   . Premature atrial contractions   . PVC's (premature ventricular contractions)   . SVT (supraventricular tachycardia) (HCC)    Past Surgical History:  Past Surgical History:  Procedure Laterality Date  . NO PAST SURGERIES      Social History:  reports that he has never smoked. He has never used smokeless tobacco. He reports that he does not drink alcohol and does not use drugs. Family History:  Family History  Problem Relation Age of Onset  . Hypertension Father   . Stroke Father   . Hypertension Mother   . Diabetes Brother   . Hypertension Brother   . Diabetes Brother      HOME MEDICATIONS: Allergies as of 07/07/2020      Reactions  Aspirin Itching      Medication List       Accurate as of July 07, 2020  7:14 AM. If you have any questions, ask your nurse or doctor.        amLODipine 10 MG tablet Commonly known as: NORVASC Take 1 tablet (10 mg total) by mouth daily.   aspirin EC 81 MG tablet Take 1 tablet (81 mg total) by mouth daily. Swallow whole.   atorvastatin 20 MG tablet Commonly known as: LIPITOR Take 1 tablet (20 mg total) by mouth daily.   brimonidine 0.2 % ophthalmic solution Commonly known as: ALPHAGAN Place 1 drop into the right eye 2 (two) times daily.   carvedilol 25 MG tablet Commonly known as: COREG Take 2  tablets (50 mg total) by mouth 2 (two) times daily.   cholecalciferol 25 MCG (1000 UNIT) tablet Commonly known as: VITAMIN D3 Take 1,000 Units by mouth daily.   cloNIDine 0.2 MG tablet Commonly known as: CATAPRES Take 1 tablet (0.2 mg total) by mouth 3 (three) times daily.   diclofenac Sodium 1 % Gel Commonly known as: VOLTAREN Apply 4 g topically 4 (four) times daily as needed for pain.   furosemide 40 MG tablet Commonly known as: LASIX Take 1 tablet (40 mg total) by mouth 2 (two) times daily.   hydrALAZINE 100 MG tablet Commonly known as: APRESOLINE Take 1 tablet (100 mg total) by mouth 3 (three) times daily.   insulin aspart 100 UNIT/ML injection Commonly known as: novoLOG Inject 0-6 Units into the skin 3 (three) times daily with meals. Sliding scale   Insulin Glargine 300 UNIT/ML Sopn Inject 18 Units into the skin at bedtime.   isosorbide mononitrate 60 MG 24 hr tablet Commonly known as: IMDUR Take 60 mg by mouth daily.   latanoprost 0.005 % ophthalmic solution Commonly known as: XALATAN Place 1 drop into the left eye at bedtime.   nitroGLYCERIN 0.4 MG SL tablet Commonly known as: NITROSTAT DISSOLVE ONE TABLET UNDER THE TONGUE EVERY 5 MINUTES AS NEEDED FOR CHEST PAIN.        OBJECTIVE:   Vital Signs: There were no vitals taken for this visit.  Wt Readings from Last 3 Encounters:  06/29/20 173 lb (78.5 kg)  06/27/20 170 lb (77.1 kg)  05/13/20 167 lb 4.2 oz (75.9 kg)     Exam: General: Pt appears well and is in NAD  Lungs: Clear with good BS bilat with no rales, rhonchi, or wheezes  Heart: RRR with normal S1 and S2 and no gallops; no murmurs; no rub  Abdomen: Normoactive bowel sounds, soft, nontender, without masses or organomegaly palpable  Extremities: Trace edema   Neuro: MS is good with appropriate affect, pt is alert and Ox3       DM foot exam: 07/16/2019 The skin of the feet is intact without sores or ulcerations. Pt with thickened discolored  and curved toe nails The pedal pulses are 1 + on right and 1+ on left. The sensation is intact to a screening 5.07, 10 gram monofilament       DATA REVIEWED:  Lab Results  Component Value Date   HGBA1C 6.2 (H) 05/12/2020   HGBA1C 6.0 (A) 11/19/2019   HGBA1C 5.9 (A) 07/16/2019   Lab Results  Component Value Date   LDLCALC 63 05/13/2020   CREATININE 2.83 (H) 05/13/2020    Results for ANIELLO, CHRISTOPOULOS (MRN [id number]) as of 07/07/2020 07:15  Ref. Range 05/13/2020 02:56  Sodium Latest Ref Range: 135 -  145 mmol/L 136  Potassium Latest Ref Range: 3.5 - 5.1 mmol/L 4.1  Chloride Latest Ref Range: 98 - 111 mmol/L 102  CO2 Latest Ref Range: 22 - 32 mmol/L 26  Glucose Latest Ref Range: 70 - 99 mg/dL 145 (H)  BUN Latest Ref Range: 8 - 23 mg/dL 46 (H)  Creatinine Latest Ref Range: 0.61 - 1.24 mg/dL 2.83 (H)  Calcium Latest Ref Range: 8.9 - 10.3 mg/dL 8.6 (L)  Anion gap Latest Ref Range: 5 - 15  8  Phosphorus Latest Ref Range: 2.5 - 4.6 mg/dL 4.5  Magnesium Latest Ref Range: 1.7 - 2.4 mg/dL 2.6 (H)  GFR, Estimated Latest Ref Range: >60 mL/min 23 (L)  Total CHOL/HDL Ratio Latest Units: RATIO 3.0  Cholesterol Latest Ref Range: 0 - 200 mg/dL 127  HDL Cholesterol Latest Ref Range: >40 mg/dL 43  LDL (calc) Latest Ref Range: 0 - 99 mg/dL 63  Triglycerides Latest Ref Range: <150 mg/dL 103  VLDL Latest Ref Range: 0 - 40 mg/dL 21    Results for FLINT, HAKEEM (MRN [id number]) as of 07/07/2020 07:15  Ref. Range 05/09/2020 11:20 05/23/2020 03:00 04/30/2020 05:48 05/12/2020 02:49 05/13/2020 02:56  Hemoglobin A1C Latest Ref Range: 4.8 - 5.6 %    6.2 (H)     ASSESSMENT / PLAN / RECOMMENDATIONS:   1) Type 2 diabetes Mellitus, With CKD III, retinopathy, and neuropathic complications - Most recent A1c of 6.2 %. Goal A1c < 7.0 %.     -His A1c is skewed due to CKD  - Somehow he is on higher dose of basal insulin, last time we reduced it due to hypoglycemia to 18 units but today he tells me he is on 25  units, since he is not having any hypoglycemic episodes on his download today, will not make any changes.   He is on novolog Vials but toujeo pen, he believes the vials are cheaper, we discussed the pens are safer to use due to accuracy in dosing, he will check with his insurance and let me know  - He requests to see our RD, will send her a message to reschedule   MEDICATIONS:  Continue Toujeo 25 units daily   NovoLog 4 units 3 times daily before every meal   If Pre-meal BG > 200 mg/dL, will add 2 units of Novolog to that meal   EDUCATION / INSTRUCTIONS:  BG monitoring instructions: Patient is instructed to check his blood sugars 3 times a day, before meals and bedtime.  Call [facility] if: BG persistently < 70 or > 300. . I reviewed the Rule of 15 for the treatment of hypoglycemia in detail with the patient. Literature supplied.    2) Diabetic complications:   Eye: He has diabetic retinopathy. S/P injections to the right eye   Neuro/ Feet: Does have known diabetic peripheral neuropathy.Has an upcoming appointment with podiatry  Renal: Patient does  have known baseline CKD. He is not on an ACEI/ARB at present.     F/U in 6 months   Signed electronically by: Mack Guise, MD  [facility] End[facility]  [facility] [address] [place], [place] [address] Phone: [phone] FAX: [phone]   CC: Robert Chaney, [place] [address] Phone: [phone]  Fax: [phone]  Return to Endocrinology clinic as below: Future Appointments  Date Time Provider [place]  07/07/2020  8:30 AM Tomeka Kantner, Melanie Crazier, MD [facility] None  07/25/2020  9:00 AM [facility] PHARMACIST  [facility]

## 2020-07-07 NOTE — Patient Instructions (Addendum)
-   Continue Toujeo 25 units daily - If your pre-meal sugar is less then 200, take 4 units of  Novolog, but if your pre-meal sugar is over 200 take 6 units of Novolog with that meal        HOW TO TREAT LOW BLOOD SUGARS (Blood sugar LESS THAN 70 MG/DL)  Please follow the RULE OF 15 for the treatment of hypoglycemia treatment (when your (blood sugars are less than 70 mg/dL)    STEP 1: Take 15 grams of carbohydrates when your blood sugar is low, which includes:   3-4 GLUCOSE TABS  OR  3-4 OZ OF JUICE OR REGULAR SODA OR  ONE TUBE OF GLUCOSE GEL     STEP 2: RECHECK blood sugar in 15 MINUTES STEP 3: If your blood sugar is still low at the 15 minute recheck --> then, go back to STEP 1 and treat AGAIN with another 15 grams of carbohydrates.

## 2020-07-07 NOTE — Telephone Encounter (Signed)
Gale Journey,    Can you please schedule a follow up visit with Mr. Vassar. I saw him today and he said he normally sees you after his visit with me so he would like to be seen   Thanks

## 2020-07-07 NOTE — Telephone Encounter (Signed)
Hi Dr. Kelton Pillar, I would be happy to see him again! I left a message for him to call and reschedule.  He was a no show 02/12/20 and 04/27/2020...  Mickel Baas

## 2020-07-16 DIAGNOSIS — I5032 Chronic diastolic (congestive) heart failure: Secondary | ICD-10-CM | POA: Diagnosis not present

## 2020-07-16 DIAGNOSIS — G4733 Obstructive sleep apnea (adult) (pediatric): Secondary | ICD-10-CM | POA: Diagnosis not present

## 2020-07-21 ENCOUNTER — Other Ambulatory Visit: Payer: Self-pay | Admitting: Internal Medicine

## 2020-07-22 DIAGNOSIS — I5032 Chronic diastolic (congestive) heart failure: Secondary | ICD-10-CM | POA: Diagnosis not present

## 2020-07-22 DIAGNOSIS — G4733 Obstructive sleep apnea (adult) (pediatric): Secondary | ICD-10-CM | POA: Diagnosis not present

## 2020-07-25 ENCOUNTER — Ambulatory Visit: Payer: PPO

## 2020-08-01 ENCOUNTER — Ambulatory Visit: Payer: PPO

## 2020-08-03 ENCOUNTER — Ambulatory Visit: Payer: PPO

## 2020-08-03 NOTE — Progress Notes (Deleted)
HPI:  Robert Chaney is a 70 y.o. male patient of Dr Oval Linsey, with a PMH below who presents today for advanced hypertension clinic follow up.   He saw Dr. Oval Linsey on 9/23 to establish care in the hypertension clinic.  Today he returns for follow up.  Current medication management includes amlodipine, carvedilol, clonidine, furosemide, and hydralazine. Noted baseline Scr above 2, with CKD III.  Marland Kitchen    PMH: Hyperlipidemia Last lipid panel 2018 (LDL 82 at the time)- on atorvastatin 20 mg  T2DM 12/2019 A1c 6.0- on toujeo max solostar 18 units qhs, novolog 0-6 units tid sliding scale with meals  Chronic diastolic HF 0/2111 EF 55-20%- fluid overload on Furosemide 80 mg qd  Obstructive sleep apnea On CPAP- has been having trouble with his machine   BP goal: <130/80 mmHg  Current HTN Medications:  amlodipine 10 mg qhs carvedilol 50 mg BID clonidine 0.2 mg TID hydralazine 100 mg TID furosemide 40 mg bid  Family Hx: brother had stroke 2 weeks ago, both parents had HTN and are deceased (mom was 49 yo, dad was in his 88s)  Social Hx: negative for smoking and alcohol use  Diet: He has someone cook for him and brings him meals at home with no added salt. Drinks coffee at least 3 times a day  Physical activity: tries to go on walks, but he is on 4 L of O2 at home and often gets short of breath which limits his ability to do physical activity  Home BP Readings:   Has 40 home readings (no dates, 2 times each day),  Systolic readings average 137 in the mornings, 132 in evenings.    Not sure about the accuracy of his home meter, as diastolic readings averaged 43 and 46 respectively, with many diastolic readings in the 80'E  Labs:  05/13/20: Na 136, K 4.1, BUN 46, SCr 2.83, GFR 23 (hospital encounter) 02/21/20: Na 138, K 4.2, BUN 39, SCr 2.46, eGFR 29  Wt Readings from Last 3 Encounters:  07/07/20 180 lb 3.2 oz (81.7 kg)  06/29/20 173 lb (78.5 kg)  06/27/20 170 lb (77.1 kg)   BP Readings  from Last 3 Encounters:  07/07/20 140/64  06/29/20 (!) 162/78  06/27/20 (!) 162/70   Pulse Readings from Last 3 Encounters:  07/07/20 84  06/29/20 71  06/27/20 74    Current Outpatient Medications  Medication Sig Dispense Refill  . amLODipine (NORVASC) 10 MG tablet Take 1 tablet (10 mg total) by mouth daily. 30 tablet 1  . aspirin EC 81 MG tablet Take 1 tablet (81 mg total) by mouth daily. Swallow whole. 90 tablet 3  . atorvastatin (LIPITOR) 20 MG tablet Take 1 tablet (20 mg total) by mouth daily. 90 tablet 2  . brimonidine (ALPHAGAN) 0.2 % ophthalmic solution Place 1 drop into the right eye 2 (two) times daily.    . carvedilol (COREG) 25 MG tablet Take 2 tablets (50 mg total) by mouth 2 (two) times daily. 120 tablet 0  . cholecalciferol (VITAMIN D3) 25 MCG (1000 UT) tablet Take 1,000 Units by mouth daily.    . cloNIDine (CATAPRES) 0.2 MG tablet Take 1 tablet (0.2 mg total) by mouth 3 (three) times daily. 90 tablet 0  . diclofenac Sodium (VOLTAREN) 1 % GEL Apply 4 g topically 4 (four) times daily as needed for pain.    . furosemide (LASIX) 40 MG tablet Take 1 tablet (40 mg total) by mouth 2 (two) times daily. [place]  tablet 1  . glucose blood (ONETOUCH VERIO) test strip USE 1 STRIP TO CHECK GLUCOSE THREE TIMES DAILY AS DIRECTED 100 each 0  . hydrALAZINE (APRESOLINE) 100 MG tablet Take 1 tablet (100 mg total) by mouth 3 (three) times daily. 270 tablet 3  . Insulin Glargine 300 UNIT/ML SOPN Inject 18 Units into the skin at bedtime.    . isosorbide mononitrate (IMDUR) 60 MG 24 hr tablet Take 60 mg by mouth daily.    Marland Kitchen latanoprost (XALATAN) 0.005 % ophthalmic solution Place 1 drop into the left eye at bedtime.    . nitroGLYCERIN (NITROSTAT) 0.4 MG SL tablet DISSOLVE ONE TABLET UNDER THE TONGUE EVERY 5 MINUTES AS NEEDED FOR CHEST PAIN. 25 tablet 7   No current facility-administered medications for this visit.    Allergies  Allergen Reactions  . Aspirin Itching    Past Medical History:   Diagnosis Date  . Altered mental status    a. 05/2017 - adm with blurred vision, somnolence in setting of AKI and high blood sugar.  . Anemia   . CKD (chronic kidney disease), stage III ([place])   . Diabetes mellitus   . Diastolic CHF, chronic (HCC)    A.  03/2009 Echo: EF 60-65%, Gr II diast dysfxn  . Elevated troponin    a. 2013 - troponin 1.4. b. 2019 - troponin 0.32; neg nuc 07/2017.  . Glaucoma   . Hyperlipidemia    HYPERCHOLESTEROLEMIA  . Hypertension    MARKED LEFT VENTRICULAR HYPERTROPHY BY PREVIOUS ECHOCARDIOGRAM--HE HAS HYPERDYNAMIC LEFT VENTRICULAR SYSTOLIC FUNCTION AND HAS IMPAIRED RELAXATION BY ECHO  . Hypertrophic cardiomyopathy ([place])   . NSVT (nonsustained ventricular tachycardia) ([place])   . Premature atrial contractions   . PVC's (premature ventricular contractions)   . SVT (supraventricular tachycardia) (HCC)     There were no vitals taken for this visit.  No problem-specific Assessment & Plan notes found for this encounter.  Kaysia Willard Rodriguez-Guzman PharmD, BCPS, [place] [address] [place] [address] 08/03/2020 1:54 PM

## 2020-08-04 DIAGNOSIS — E7849 Other hyperlipidemia: Secondary | ICD-10-CM | POA: Diagnosis not present

## 2020-08-04 DIAGNOSIS — E1122 Type 2 diabetes mellitus with diabetic chronic kidney disease: Secondary | ICD-10-CM | POA: Diagnosis not present

## 2020-08-04 DIAGNOSIS — I509 Heart failure, unspecified: Secondary | ICD-10-CM | POA: Diagnosis not present

## 2020-08-04 DIAGNOSIS — I10 Essential (primary) hypertension: Secondary | ICD-10-CM | POA: Diagnosis not present

## 2020-08-04 DIAGNOSIS — J9611 Chronic respiratory failure with hypoxia: Secondary | ICD-10-CM | POA: Diagnosis not present

## 2020-08-10 DIAGNOSIS — G4733 Obstructive sleep apnea (adult) (pediatric): Secondary | ICD-10-CM | POA: Diagnosis not present

## 2020-08-16 DIAGNOSIS — G4733 Obstructive sleep apnea (adult) (pediatric): Secondary | ICD-10-CM | POA: Diagnosis not present

## 2020-08-16 DIAGNOSIS — I5032 Chronic diastolic (congestive) heart failure: Secondary | ICD-10-CM | POA: Diagnosis not present

## 2020-08-21 DIAGNOSIS — I5032 Chronic diastolic (congestive) heart failure: Secondary | ICD-10-CM | POA: Diagnosis not present

## 2020-08-21 DIAGNOSIS — G4733 Obstructive sleep apnea (adult) (pediatric): Secondary | ICD-10-CM | POA: Diagnosis not present

## 2020-09-13 DIAGNOSIS — I5032 Chronic diastolic (congestive) heart failure: Secondary | ICD-10-CM | POA: Diagnosis not present

## 2020-09-13 DIAGNOSIS — G4733 Obstructive sleep apnea (adult) (pediatric): Secondary | ICD-10-CM | POA: Diagnosis not present

## 2020-09-19 DIAGNOSIS — I5032 Chronic diastolic (congestive) heart failure: Secondary | ICD-10-CM | POA: Diagnosis not present

## 2020-09-19 DIAGNOSIS — G4733 Obstructive sleep apnea (adult) (pediatric): Secondary | ICD-10-CM | POA: Diagnosis not present

## 2020-10-14 DIAGNOSIS — G4733 Obstructive sleep apnea (adult) (pediatric): Secondary | ICD-10-CM | POA: Diagnosis not present

## 2020-10-14 DIAGNOSIS — I5032 Chronic diastolic (congestive) heart failure: Secondary | ICD-10-CM | POA: Diagnosis not present

## 2020-10-20 DIAGNOSIS — I5032 Chronic diastolic (congestive) heart failure: Secondary | ICD-10-CM | POA: Diagnosis not present

## 2020-10-20 DIAGNOSIS — G4733 Obstructive sleep apnea (adult) (pediatric): Secondary | ICD-10-CM | POA: Diagnosis not present

## 2020-11-10 DIAGNOSIS — I509 Heart failure, unspecified: Secondary | ICD-10-CM | POA: Diagnosis not present

## 2020-11-10 DIAGNOSIS — N1832 Chronic kidney disease, stage 3b: Secondary | ICD-10-CM | POA: Diagnosis not present

## 2020-11-10 DIAGNOSIS — I10 Essential (primary) hypertension: Secondary | ICD-10-CM | POA: Diagnosis not present

## 2020-11-10 DIAGNOSIS — E1122 Type 2 diabetes mellitus with diabetic chronic kidney disease: Secondary | ICD-10-CM | POA: Diagnosis not present

## 2020-11-13 DIAGNOSIS — I5032 Chronic diastolic (congestive) heart failure: Secondary | ICD-10-CM | POA: Diagnosis not present

## 2020-11-13 DIAGNOSIS — G4733 Obstructive sleep apnea (adult) (pediatric): Secondary | ICD-10-CM | POA: Diagnosis not present

## 2020-12-14 DIAGNOSIS — G4733 Obstructive sleep apnea (adult) (pediatric): Secondary | ICD-10-CM | POA: Diagnosis not present

## 2020-12-14 DIAGNOSIS — I5032 Chronic diastolic (congestive) heart failure: Secondary | ICD-10-CM | POA: Diagnosis not present

## 2020-12-20 DIAGNOSIS — G4733 Obstructive sleep apnea (adult) (pediatric): Secondary | ICD-10-CM | POA: Diagnosis not present

## 2020-12-20 DIAGNOSIS — I5032 Chronic diastolic (congestive) heart failure: Secondary | ICD-10-CM | POA: Diagnosis not present

## 2020-12-22 DIAGNOSIS — I509 Heart failure, unspecified: Secondary | ICD-10-CM | POA: Diagnosis not present

## 2020-12-22 DIAGNOSIS — I10 Essential (primary) hypertension: Secondary | ICD-10-CM | POA: Diagnosis not present

## 2020-12-22 DIAGNOSIS — E1122 Type 2 diabetes mellitus with diabetic chronic kidney disease: Secondary | ICD-10-CM | POA: Diagnosis not present

## 2020-12-22 DIAGNOSIS — I739 Peripheral vascular disease, unspecified: Secondary | ICD-10-CM | POA: Diagnosis not present

## 2020-12-22 DIAGNOSIS — J9611 Chronic respiratory failure with hypoxia: Secondary | ICD-10-CM | POA: Diagnosis not present

## 2021-01-03 ENCOUNTER — Ambulatory Visit: Payer: PPO | Admitting: Internal Medicine

## 2021-01-05 ENCOUNTER — Other Ambulatory Visit (HOSPITAL_COMMUNITY): Payer: Self-pay | Admitting: Internal Medicine

## 2021-01-05 DIAGNOSIS — I509 Heart failure, unspecified: Secondary | ICD-10-CM | POA: Diagnosis not present

## 2021-01-05 DIAGNOSIS — I10 Essential (primary) hypertension: Secondary | ICD-10-CM | POA: Diagnosis not present

## 2021-01-05 DIAGNOSIS — I739 Peripheral vascular disease, unspecified: Secondary | ICD-10-CM

## 2021-01-05 DIAGNOSIS — J9611 Chronic respiratory failure with hypoxia: Secondary | ICD-10-CM | POA: Diagnosis not present

## 2021-01-05 DIAGNOSIS — E1122 Type 2 diabetes mellitus with diabetic chronic kidney disease: Secondary | ICD-10-CM | POA: Diagnosis not present

## 2021-01-08 ENCOUNTER — Other Ambulatory Visit: Payer: Self-pay

## 2021-01-08 ENCOUNTER — Ambulatory Visit (HOSPITAL_COMMUNITY)
Admission: RE | Admit: 2021-01-08 | Discharge: 2021-01-08 | Disposition: A | Discharge status: Home / Self Care | Payer: PPO | Source: Ambulatory Visit | Attending: Internal Medicine | Admitting: Internal Medicine

## 2021-01-08 DIAGNOSIS — I739 Peripheral vascular disease, unspecified: Secondary | ICD-10-CM | POA: Diagnosis not present

## 2021-01-08 DIAGNOSIS — R918 Other nonspecific abnormal finding of lung field: Secondary | ICD-10-CM | POA: Diagnosis not present

## 2021-01-12 ENCOUNTER — Encounter: Payer: Self-pay | Admitting: Internal Medicine

## 2021-01-12 ENCOUNTER — Other Ambulatory Visit: Payer: Self-pay

## 2021-01-12 ENCOUNTER — Ambulatory Visit (INDEPENDENT_AMBULATORY_CARE_PROVIDER_SITE_OTHER): Payer: PPO | Admitting: Internal Medicine

## 2021-01-12 VITALS — BP 150/62 | HR 85 | Ht 68.0 in | Wt 171.0 lb

## 2021-01-12 DIAGNOSIS — Z794 Long term (current) use of insulin: Secondary | ICD-10-CM | POA: Diagnosis not present

## 2021-01-12 DIAGNOSIS — E11319 Type 2 diabetes mellitus with unspecified diabetic retinopathy without macular edema: Secondary | ICD-10-CM

## 2021-01-12 DIAGNOSIS — E1142 Type 2 diabetes mellitus with diabetic polyneuropathy: Secondary | ICD-10-CM | POA: Diagnosis not present

## 2021-01-12 DIAGNOSIS — E1122 Type 2 diabetes mellitus with diabetic chronic kidney disease: Secondary | ICD-10-CM | POA: Diagnosis not present

## 2021-01-12 DIAGNOSIS — N1831 Chronic kidney disease, stage 3a: Secondary | ICD-10-CM | POA: Diagnosis not present

## 2021-01-12 LAB — POCT GLYCOSYLATED HEMOGLOBIN (HGB A1C): Hemoglobin A1C: 5.8 % — AB (ref 4.0–5.6)

## 2021-01-12 MED ORDER — DEXCOM G6 TRANSMITTER MISC: 1.0000 | 3 refills | Status: AC

## 2021-01-12 MED ORDER — DEXCOM G6 SENSOR MISC: 1.0000 | 3 refills | Status: AC

## 2021-01-12 NOTE — Progress Notes (Signed)
Name: Robert Chaney  Age/ Sex: 70 y.o., male   MRN/ DOB: [id number], March 09, 1951     PCP: [name], MD   Reason for Endocrinology Evaluation: Type 2 Diabetes Mellitus  Initial Endocrine Consultative Visit: 06/30/2018    Robert Chaney IDENTIFIER: Robert Chaney is a 70 y.o. male with a past medical history of T2DM, HTN, hypertrophic cardiomyopathy, and CKD III. The Robert Chaney has followed with Endocrinology clinic since 06/30/2018 for consultative assistance with management of his diabetes.  DIABETIC HISTORY:  Robert Chaney was diagnosed with T2DM Since 2014, started insulin therapy at diagnosis. His hemoglobin A1c has ranged from 6.9%  in 2015, peaking at 13.0%  In 2019.  Linagliptin discontinued in 06/2018 due to cost    Saw Antonieta Iba 08/2018 SUBJECTIVE:   During the last visit (07/07/2020): A1c 6.2 %We continued MDI regimen    Today (01/12/2021): Robert Chaney is here for 68-month follow-up on his diabetes management. Robert Chaney checks his blood sugars occasionally . The Robert Chaney has not had hypoglycemic episodes since the last clinic visit.   Robert Chaney has been having chronic sob and LE pain with exertion. Robert Chaney is on 24-hr oxygen but Robert Chaney had fallen in the past and has not been carrying it.  Robert Chaney had an admission for acute pulmonary edema  No LE swelling  Robert Chaney recently has LE ultrasound pending results through PCP   HOME DIABETES REGIMEN:  Toujeo 25 units daily - has been taking 15 units  Novolog 4 units TIDQAC- takes 3 units  If Pre-meal BG > 200 mg/dL, will add 2 units of Novolog to that meal     METER DOWNLOAD SUMMARY: 7/8 - 01/12/2021 Average checks per day = 0.3 Overall Mean FS Glucose = 128  BG Ranges: Low = 105 High = 149   Hypoglycemic Events/30 Days: BG < 50 = 0 Episodes of symptomatic severe hypoglycemia = 0    DIABETIC COMPLICATIONS: Microvascular complications:  Neuropathy, CKD III, retinopathy on the right  Last eye exam: Completed 07/2020(S/P right eye retinopathy) scheduled 8/8th     Macrovascular complications:    Denies: CAD, PVD, CVA  HISTORY:  Past Medical History:  Past Medical History:  Diagnosis Date  . Altered mental status    a. 05/2017 - adm with blurred vision, somnolence in setting of AKI and high blood sugar.  . Anemia   . CKD (chronic kidney disease), stage III ([place])   . Diabetes mellitus   . Diastolic CHF, chronic (HCC)    A.  03/2009 Echo: EF 60-65%, Gr II diast dysfxn  . Elevated troponin    a. 2013 - troponin 1.4. b. 2019 - troponin 0.32; neg nuc 07/2017.  . Glaucoma   . Hyperlipidemia    HYPERCHOLESTEROLEMIA  . 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 Hypertrophic cardiomyopathy ([place])   . NSVT (nonsustained ventricular tachycardia) ([place])   . Premature atrial contractions   . PVC's (premature ventricular contractions)   . SVT (supraventricular tachycardia) (HCC)    Past Surgical History:  Past Surgical History:  Procedure Laterality Date  . NO PAST SURGERIES     Social History:  reports that Robert Chaney has never smoked. Robert Chaney has never used smokeless tobacco. Robert Chaney reports that Robert Chaney does not drink alcohol and does not use drugs. Family History:  Family History  Problem Relation Age of Onset  . Hypertension Father   . Stroke Father   .  Hypertension Mother   . Diabetes Brother   . Hypertension Brother   . Diabetes Brother      HOME MEDICATIONS: Allergies as of 01/12/2021       Reactions   Aspirin Itching        Medication List        Accurate as of January 12, 2021  5:43 PM. If you have any questions, ask your nurse or doctor.          amLODipine 10 MG tablet Commonly known as: NORVASC Take 1 tablet (10 mg total) by mouth daily.   aspirin EC 81 MG tablet Take 1 tablet (81 mg total) by mouth daily. Swallow whole.   atorvastatin 20 MG tablet Commonly known as: LIPITOR Take 1 tablet (20 mg total)  by mouth daily.   brimonidine 0.2 % ophthalmic solution Commonly known as: ALPHAGAN Place 1 drop into the right eye 2 (two) times daily.   carvedilol 25 MG tablet Commonly known as: COREG Take 2 tablets (50 mg total) by mouth 2 (two) times daily.   cholecalciferol 25 MCG (1000 UNIT) tablet Commonly known as: VITAMIN D3 Take 1,000 Units by mouth daily.   cloNIDine 0.2 MG tablet Commonly known as: CATAPRES Take 1 tablet (0.2 mg total) by mouth 3 (three) times daily.   Dexcom G6 Sensor Misc 1 Device by Does not apply route as directed. Started by: Dorita Sciara, MD   Dexcom G6 Transmitter Misc 1 Device by Does not apply route as directed. Started by: Dorita Sciara, MD   diclofenac Sodium 1 % Gel Commonly known as: VOLTAREN Apply 4 g topically 4 (four) times daily as needed for pain.   furosemide 40 MG tablet Commonly known as: LASIX Take 1 tablet (40 mg total) by mouth 2 (two) times daily.   hydrALAZINE 100 MG tablet Commonly known as: APRESOLINE Take 1 tablet (100 mg total) by mouth 3 (three) times daily.   Insulin Glargine 300 UNIT/ML Sopn Inject 18 Units into the skin at bedtime.   isosorbide mononitrate 60 MG 24 hr tablet Commonly known as: IMDUR Take 60 mg by mouth daily.   latanoprost 0.005 % ophthalmic solution Commonly known as: XALATAN Place 1 drop into the left eye at bedtime.   nitroGLYCERIN 0.4 MG SL tablet Commonly known as: NITROSTAT DISSOLVE ONE TABLET UNDER THE TONGUE EVERY 5 MINUTES AS NEEDED FOR CHEST PAIN.   OneTouch Verio test strip Generic drug: glucose blood USE 1 STRIP TO CHECK GLUCOSE THREE TIMES DAILY AS DIRECTED         OBJECTIVE:   Vital Signs: BP (!) 150/62   Pulse 85   Ht 5\' 8"  (1.727 m)   Wt 171 lb (77.6 kg)   SpO2 (!) 85%   BMI 26.00 kg/m   Wt Readings from Last 3 Encounters:  01/12/21 171 lb (77.6 kg)  07/07/20 180 lb 3.2 oz (81.7 kg)  06/29/20 173 lb (78.5 kg)     Exam: General: Robert Chaney appears well  and is in NAD  Lungs: Clear with good BS bilat with no rales, rhonchi, or wheezes  Heart: RRR   Extremities: 1+ edema   Neuro: MS is good with appropriate affect, Robert Chaney is alert and Ox3       DM foot exam: 07/16/2019 The skin of the feet is intact without sores or ulcerations. Robert Chaney with thickened discolored and curved toe nails The pedal pulses are 1 + on right and 1+ on left. The sensation is intact to a  screening 5.07, 10 gram monofilament       DATA REVIEWED:  Lab Results  Component Value Date   HGBA1C 5.8 (A) 01/12/2021   HGBA1C 6.2 (H) 05/12/2020   HGBA1C 6.0 (A) 11/19/2019   Lab Results  Component Value Date   LDLCALC 63 05/13/2020   CREATININE 2.83 (H) 05/13/2020    Results for ANTWOIN, LACKEY (MRN [id number]) as of 07/07/2020 07:15  Ref. Range 05/13/2020 02:56  Sodium Latest Ref Range: 135 - 145 mmol/L 136  Potassium Latest Ref Range: 3.5 - 5.1 mmol/L 4.1  Chloride Latest Ref Range: 98 - 111 mmol/L 102  CO2 Latest Ref Range: 22 - 32 mmol/L 26  Glucose Latest Ref Range: 70 - 99 mg/dL 145 (H)  BUN Latest Ref Range: 8 - 23 mg/dL 46 (H)  Creatinine Latest Ref Range: 0.61 - 1.24 mg/dL 2.83 (H)  Calcium Latest Ref Range: 8.9 - 10.3 mg/dL 8.6 (L)  Anion gap Latest Ref Range: 5 - 15  8  Phosphorus Latest Ref Range: 2.5 - 4.6 mg/dL 4.5  Magnesium Latest Ref Range: 1.7 - 2.4 mg/dL 2.6 (H)  GFR, Estimated Latest Ref Range: >60 mL/min 23 (L)  Total CHOL/HDL Ratio Latest Units: RATIO 3.0  Cholesterol Latest Ref Range: 0 - 200 mg/dL 127  HDL Cholesterol Latest Ref Range: >40 mg/dL 43  LDL (calc) Latest Ref Range: 0 - 99 mg/dL 63  Triglycerides Latest Ref Range: <150 mg/dL 103  VLDL Latest Ref Range: 0 - 40 mg/dL 21      ASSESSMENT / PLAN / RECOMMENDATIONS:   1) Type 2 diabetes Mellitus, With CKD IV, retinopathy, neuropathic and macrovascular complications - Most recent A1c of 5.8 %. Goal A1c < 7.0 %.     -His A1c is skewed due to CKD  -The last time I saw him which was 6  months ago Robert Chaney was on higher doses of basal insulin, but today Robert Chaney is on lower doses of basal insulin since the last time I saw him.  I did explain to him my concern about variable basal insulin doses and this puts him at risk for hypoglycemia.  Robert Chaney tells me his PCP has been adjusting his basal insulin. -I have encouraged the Robert Chaney to remain on the same dose of basal insulin  -NovoLog has been cost prohibitive, Robert Chaney was provided with Robert Chaney assistance papers    MEDICATIONS: Continue Toujeo 15 units daily  NovoLog 4 units 3 times daily before every meal  If Pre-meal BG > 200 mg/dL, will add 3 units of Novolog to that meal   EDUCATION / INSTRUCTIONS: BG monitoring instructions: Robert Chaney is instructed to check his blood sugars 3 times a day, before meals and bedtime. Call [facility] if: BG persistently < 70 or > 300. I reviewed the Rule of 15 for the treatment of hypoglycemia in detail with the Robert Chaney. Literature supplied.    2) Diabetic complications:  Eye: Robert Chaney has diabetic retinopathy. S/P injections to the right eye  Neuro/ Feet: Does have known diabetic peripheral neuropathy.Has an upcoming appointment with podiatry Renal: Robert Chaney does  have known baseline CKD. Robert Chaney is not on an ACEI/ARB at present.    3) PVD:  -Robert Chaney is pending vascular work-up through his PCP    F/U in 6 months   Signed electronically by: Mack Guise, MD  [facility]  [address] [address] [place], [place] [address] Phone: [phone] FAX: [phone]   CC: [name], MD [phone] [address]  [place] [address] Phone: [phone]  Fax: [phone]  Return to Endocrinology clinic as below: Future Appointments  Date Time Provider [place]  07/20/2021  9:10 AM Othal Kubitz, Melanie Crazier, MD [id number] None

## 2021-01-12 NOTE — Patient Instructions (Signed)
-   Continue Toujeo 15 units daily - If your pre-meal sugar is less then 200, take 3 units of  Novolog, but if your pre-meal sugar is over 200 take 6 units of Novolog with that meal       HOW TO TREAT LOW BLOOD SUGARS (Blood sugar LESS THAN 70 MG/DL) Please follow the RULE OF 15 for the treatment of hypoglycemia treatment (when your (blood sugars are less than 70 mg/dL)   STEP 1: Take 15 grams of carbohydrates when your blood sugar is low, which includes:  3-4 GLUCOSE TABS  OR 3-4 OZ OF JUICE OR REGULAR SODA OR ONE TUBE OF GLUCOSE GEL    STEP 2: RECHECK blood sugar in 15 MINUTES STEP 3: If your blood sugar is still low at the 15 minute recheck --> then, go back to STEP 1 and treat AGAIN with another 15 grams of carbohydrates.

## 2021-01-13 DIAGNOSIS — I5032 Chronic diastolic (congestive) heart failure: Secondary | ICD-10-CM | POA: Diagnosis not present

## 2021-01-13 DIAGNOSIS — G4733 Obstructive sleep apnea (adult) (pediatric): Secondary | ICD-10-CM | POA: Diagnosis not present

## 2021-01-19 DIAGNOSIS — I509 Heart failure, unspecified: Secondary | ICD-10-CM | POA: Diagnosis not present

## 2021-01-19 DIAGNOSIS — I10 Essential (primary) hypertension: Secondary | ICD-10-CM | POA: Diagnosis not present

## 2021-01-19 DIAGNOSIS — K5900 Constipation, unspecified: Secondary | ICD-10-CM | POA: Diagnosis not present

## 2021-01-19 DIAGNOSIS — I5032 Chronic diastolic (congestive) heart failure: Secondary | ICD-10-CM | POA: Diagnosis not present

## 2021-01-19 DIAGNOSIS — E1122 Type 2 diabetes mellitus with diabetic chronic kidney disease: Secondary | ICD-10-CM | POA: Diagnosis not present

## 2021-01-19 DIAGNOSIS — I739 Peripheral vascular disease, unspecified: Secondary | ICD-10-CM | POA: Diagnosis not present

## 2021-01-19 DIAGNOSIS — G4733 Obstructive sleep apnea (adult) (pediatric): Secondary | ICD-10-CM | POA: Diagnosis not present

## 2021-01-29 ENCOUNTER — Encounter (HOSPITAL_COMMUNITY): Payer: Self-pay | Admitting: Emergency Medicine

## 2021-01-29 ENCOUNTER — Emergency Department (HOSPITAL_COMMUNITY): Payer: PPO

## 2021-01-29 ENCOUNTER — Inpatient Hospital Stay (HOSPITAL_COMMUNITY)
Admission: EM | Admit: 2021-01-29 | Discharge: 2021-02-01 | DRG: 193 | Disposition: A | Discharge status: Home / Self Care | Payer: PPO | Attending: Internal Medicine | Admitting: Internal Medicine

## 2021-01-29 ENCOUNTER — Other Ambulatory Visit: Payer: Self-pay

## 2021-01-29 DIAGNOSIS — N184 Chronic kidney disease, stage 4 (severe): Secondary | ICD-10-CM | POA: Diagnosis not present

## 2021-01-29 DIAGNOSIS — I5033 Acute on chronic diastolic (congestive) heart failure: Secondary | ICD-10-CM | POA: Diagnosis not present

## 2021-01-29 DIAGNOSIS — F32A Depression, unspecified: Secondary | ICD-10-CM | POA: Diagnosis not present

## 2021-01-29 DIAGNOSIS — I77810 Thoracic aortic ectasia: Secondary | ICD-10-CM | POA: Diagnosis not present

## 2021-01-29 DIAGNOSIS — R918 Other nonspecific abnormal finding of lung field: Secondary | ICD-10-CM | POA: Diagnosis not present

## 2021-01-29 DIAGNOSIS — E11319 Type 2 diabetes mellitus with unspecified diabetic retinopathy without macular edema: Secondary | ICD-10-CM | POA: Diagnosis present

## 2021-01-29 DIAGNOSIS — E872 Acidosis: Secondary | ICD-10-CM | POA: Diagnosis not present

## 2021-01-29 DIAGNOSIS — R55 Syncope and collapse: Secondary | ICD-10-CM | POA: Diagnosis not present

## 2021-01-29 DIAGNOSIS — Z66 Do not resuscitate: Secondary | ICD-10-CM | POA: Diagnosis present

## 2021-01-29 DIAGNOSIS — I421 Obstructive hypertrophic cardiomyopathy: Secondary | ICD-10-CM | POA: Diagnosis not present

## 2021-01-29 DIAGNOSIS — J90 Pleural effusion, not elsewhere classified: Secondary | ICD-10-CM | POA: Diagnosis not present

## 2021-01-29 DIAGNOSIS — J9601 Acute respiratory failure with hypoxia: Secondary | ICD-10-CM | POA: Diagnosis not present

## 2021-01-29 DIAGNOSIS — I248 Other forms of acute ischemic heart disease: Secondary | ICD-10-CM | POA: Diagnosis present

## 2021-01-29 DIAGNOSIS — Z961 Presence of intraocular lens: Secondary | ICD-10-CM | POA: Diagnosis not present

## 2021-01-29 DIAGNOSIS — Z8616 Personal history of COVID-19: Secondary | ICD-10-CM

## 2021-01-29 DIAGNOSIS — H409 Unspecified glaucoma: Secondary | ICD-10-CM | POA: Diagnosis present

## 2021-01-29 DIAGNOSIS — N1831 Chronic kidney disease, stage 3a: Secondary | ICD-10-CM

## 2021-01-29 DIAGNOSIS — Z823 Family history of stroke: Secondary | ICD-10-CM | POA: Diagnosis not present

## 2021-01-29 DIAGNOSIS — J189 Pneumonia, unspecified organism: Principal | ICD-10-CM | POA: Diagnosis present

## 2021-01-29 DIAGNOSIS — Z8249 Family history of ischemic heart disease and other diseases of the circulatory system: Secondary | ICD-10-CM

## 2021-01-29 DIAGNOSIS — R0602 Shortness of breath: Secondary | ICD-10-CM

## 2021-01-29 DIAGNOSIS — H26493 Other secondary cataract, bilateral: Secondary | ICD-10-CM | POA: Diagnosis not present

## 2021-01-29 DIAGNOSIS — I169 Hypertensive crisis, unspecified: Secondary | ICD-10-CM | POA: Diagnosis present

## 2021-01-29 DIAGNOSIS — Z833 Family history of diabetes mellitus: Secondary | ICD-10-CM

## 2021-01-29 DIAGNOSIS — I517 Cardiomegaly: Secondary | ICD-10-CM | POA: Diagnosis not present

## 2021-01-29 DIAGNOSIS — Z886 Allergy status to analgesic agent status: Secondary | ICD-10-CM

## 2021-01-29 DIAGNOSIS — I10 Essential (primary) hypertension: Secondary | ICD-10-CM | POA: Diagnosis not present

## 2021-01-29 DIAGNOSIS — I130 Hypertensive heart and chronic kidney disease with heart failure and stage 1 through stage 4 chronic kidney disease, or unspecified chronic kidney disease: Secondary | ICD-10-CM | POA: Diagnosis not present

## 2021-01-29 DIAGNOSIS — Z9981 Dependence on supplemental oxygen: Secondary | ICD-10-CM

## 2021-01-29 DIAGNOSIS — I422 Other hypertrophic cardiomyopathy: Secondary | ICD-10-CM

## 2021-01-29 DIAGNOSIS — J811 Chronic pulmonary edema: Secondary | ICD-10-CM | POA: Diagnosis not present

## 2021-01-29 DIAGNOSIS — G4733 Obstructive sleep apnea (adult) (pediatric): Secondary | ICD-10-CM | POA: Diagnosis not present

## 2021-01-29 DIAGNOSIS — J9621 Acute and chronic respiratory failure with hypoxia: Secondary | ICD-10-CM | POA: Diagnosis not present

## 2021-01-29 DIAGNOSIS — R7989 Other specified abnormal findings of blood chemistry: Secondary | ICD-10-CM | POA: Diagnosis not present

## 2021-01-29 DIAGNOSIS — E1122 Type 2 diabetes mellitus with diabetic chronic kidney disease: Secondary | ICD-10-CM | POA: Diagnosis present

## 2021-01-29 DIAGNOSIS — Z7982 Long term (current) use of aspirin: Secondary | ICD-10-CM

## 2021-01-29 DIAGNOSIS — Z794 Long term (current) use of insulin: Secondary | ICD-10-CM

## 2021-01-29 DIAGNOSIS — J9611 Chronic respiratory failure with hypoxia: Secondary | ICD-10-CM

## 2021-01-29 DIAGNOSIS — R0902 Hypoxemia: Secondary | ICD-10-CM | POA: Diagnosis not present

## 2021-01-29 DIAGNOSIS — I700 Atherosclerosis of aorta: Secondary | ICD-10-CM | POA: Diagnosis not present

## 2021-01-29 DIAGNOSIS — I160 Hypertensive urgency: Secondary | ICD-10-CM | POA: Diagnosis present

## 2021-01-29 DIAGNOSIS — D631 Anemia in chronic kidney disease: Secondary | ICD-10-CM | POA: Diagnosis present

## 2021-01-29 DIAGNOSIS — Z79899 Other long term (current) drug therapy: Secondary | ICD-10-CM

## 2021-01-29 DIAGNOSIS — E7800 Pure hypercholesterolemia, unspecified: Secondary | ICD-10-CM | POA: Diagnosis not present

## 2021-01-29 DIAGNOSIS — H401112 Primary open-angle glaucoma, right eye, moderate stage: Secondary | ICD-10-CM | POA: Diagnosis not present

## 2021-01-29 DIAGNOSIS — I509 Heart failure, unspecified: Secondary | ICD-10-CM

## 2021-01-29 DIAGNOSIS — R079 Chest pain, unspecified: Secondary | ICD-10-CM | POA: Diagnosis not present

## 2021-01-29 DIAGNOSIS — H401123 Primary open-angle glaucoma, left eye, severe stage: Secondary | ICD-10-CM | POA: Diagnosis not present

## 2021-01-29 DIAGNOSIS — J9622 Acute and chronic respiratory failure with hypercapnia: Secondary | ICD-10-CM | POA: Diagnosis not present

## 2021-01-29 DIAGNOSIS — Z9119 Patient's noncompliance with other medical treatment and regimen: Secondary | ICD-10-CM

## 2021-01-29 DIAGNOSIS — E113293 Type 2 diabetes mellitus with mild nonproliferative diabetic retinopathy without macular edema, bilateral: Secondary | ICD-10-CM | POA: Diagnosis not present

## 2021-01-29 DIAGNOSIS — Z20822 Contact with and (suspected) exposure to covid-19: Secondary | ICD-10-CM | POA: Diagnosis present

## 2021-01-29 LAB — CBC
HCT: 28.4 % — ABNORMAL LOW (ref 39.0–52.0)
Hemoglobin: 8.3 g/dL — ABNORMAL LOW (ref 13.0–17.0)
MCH: 28 pg (ref 26.0–34.0)
MCHC: 29.2 g/dL — ABNORMAL LOW (ref 30.0–36.0)
MCV: 95.9 fL (ref 80.0–100.0)
Platelets: 212 10*3/uL (ref 150–400)
RBC: 2.96 MIL/uL — ABNORMAL LOW (ref 4.22–5.81)
RDW: 15.8 % — ABNORMAL HIGH (ref 11.5–15.5)
WBC: 6.3 10*3/uL (ref 4.0–10.5)
nRBC: 0 % (ref 0.0–0.2)

## 2021-01-29 LAB — BASIC METABOLIC PANEL
Anion gap: 6 (ref 5–15)
BUN: 32 mg/dL — ABNORMAL HIGH (ref 8–23)
CO2: 24 mmol/L (ref 22–32)
Calcium: 8.8 mg/dL — ABNORMAL LOW (ref 8.9–10.3)
Chloride: 107 mmol/L (ref 98–111)
Creatinine, Ser: 2.3 mg/dL — ABNORMAL HIGH (ref 0.61–1.24)
GFR, Estimated: 29 mL/min — ABNORMAL LOW (ref >60)
Glucose, Bld: 190 mg/dL — ABNORMAL HIGH (ref 70–99)
Potassium: 4.7 mmol/L (ref 3.5–5.1)
Sodium: 137 mmol/L (ref 135–145)

## 2021-01-29 LAB — TROPONIN I (HIGH SENSITIVITY)
Troponin I (High Sensitivity): 70 ng/L — ABNORMAL HIGH (ref <18)
Troponin I (High Sensitivity): 68 ng/L — ABNORMAL HIGH (ref <18)

## 2021-01-29 LAB — BRAIN NATRIURETIC PEPTIDE: B Natriuretic Peptide: 1094.1 pg/mL — ABNORMAL HIGH (ref 0.0–100.0)
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
## 2021-01-30 ENCOUNTER — Emergency Department (HOSPITAL_COMMUNITY): Payer: PPO

## 2021-01-30 ENCOUNTER — Inpatient Hospital Stay (HOSPITAL_COMMUNITY): Payer: PPO

## 2021-01-30 DIAGNOSIS — Z66 Do not resuscitate: Secondary | ICD-10-CM | POA: Diagnosis present

## 2021-01-30 DIAGNOSIS — R55 Syncope and collapse: Secondary | ICD-10-CM

## 2021-01-30 DIAGNOSIS — D631 Anemia in chronic kidney disease: Secondary | ICD-10-CM | POA: Diagnosis present

## 2021-01-30 DIAGNOSIS — I248 Other forms of acute ischemic heart disease: Secondary | ICD-10-CM | POA: Diagnosis present

## 2021-01-30 DIAGNOSIS — N184 Chronic kidney disease, stage 4 (severe): Secondary | ICD-10-CM | POA: Diagnosis present

## 2021-01-30 DIAGNOSIS — J9621 Acute and chronic respiratory failure with hypoxia: Secondary | ICD-10-CM

## 2021-01-30 DIAGNOSIS — E1122 Type 2 diabetes mellitus with diabetic chronic kidney disease: Secondary | ICD-10-CM | POA: Diagnosis present

## 2021-01-30 DIAGNOSIS — I421 Obstructive hypertrophic cardiomyopathy: Secondary | ICD-10-CM | POA: Diagnosis present

## 2021-01-30 DIAGNOSIS — I160 Hypertensive urgency: Secondary | ICD-10-CM | POA: Diagnosis present

## 2021-01-30 DIAGNOSIS — I130 Hypertensive heart and chronic kidney disease with heart failure and stage 1 through stage 4 chronic kidney disease, or unspecified chronic kidney disease: Secondary | ICD-10-CM | POA: Diagnosis present

## 2021-01-30 DIAGNOSIS — J9601 Acute respiratory failure with hypoxia: Secondary | ICD-10-CM | POA: Diagnosis not present

## 2021-01-30 DIAGNOSIS — Z8249 Family history of ischemic heart disease and other diseases of the circulatory system: Secondary | ICD-10-CM | POA: Diagnosis not present

## 2021-01-30 DIAGNOSIS — Z79899 Other long term (current) drug therapy: Secondary | ICD-10-CM | POA: Diagnosis not present

## 2021-01-30 DIAGNOSIS — J188 Other pneumonia, unspecified organism: Secondary | ICD-10-CM

## 2021-01-30 DIAGNOSIS — I422 Other hypertrophic cardiomyopathy: Secondary | ICD-10-CM | POA: Diagnosis present

## 2021-01-30 DIAGNOSIS — Z20822 Contact with and (suspected) exposure to covid-19: Secondary | ICD-10-CM | POA: Diagnosis present

## 2021-01-30 DIAGNOSIS — J9622 Acute and chronic respiratory failure with hypercapnia: Secondary | ICD-10-CM | POA: Diagnosis present

## 2021-01-30 DIAGNOSIS — I77810 Thoracic aortic ectasia: Secondary | ICD-10-CM | POA: Diagnosis present

## 2021-01-30 DIAGNOSIS — I5033 Acute on chronic diastolic (congestive) heart failure: Secondary | ICD-10-CM | POA: Diagnosis present

## 2021-01-30 DIAGNOSIS — J9611 Chronic respiratory failure with hypoxia: Secondary | ICD-10-CM

## 2021-01-30 DIAGNOSIS — Z833 Family history of diabetes mellitus: Secondary | ICD-10-CM | POA: Diagnosis not present

## 2021-01-30 DIAGNOSIS — J189 Pneumonia, unspecified organism: Principal | ICD-10-CM

## 2021-01-30 DIAGNOSIS — Z823 Family history of stroke: Secondary | ICD-10-CM | POA: Diagnosis not present

## 2021-01-30 DIAGNOSIS — E872 Acidosis: Secondary | ICD-10-CM | POA: Diagnosis not present

## 2021-01-30 DIAGNOSIS — G4733 Obstructive sleep apnea (adult) (pediatric): Secondary | ICD-10-CM | POA: Diagnosis present

## 2021-01-30 DIAGNOSIS — Z8616 Personal history of COVID-19: Secondary | ICD-10-CM | POA: Diagnosis not present

## 2021-01-30 DIAGNOSIS — E7800 Pure hypercholesterolemia, unspecified: Secondary | ICD-10-CM | POA: Diagnosis present

## 2021-01-30 DIAGNOSIS — E11319 Type 2 diabetes mellitus with unspecified diabetic retinopathy without macular edema: Secondary | ICD-10-CM | POA: Diagnosis present

## 2021-01-30 LAB — ECHOCARDIOGRAM COMPLETE
Area-P 1/2: 2.41 cm2
S' Lateral: 2.5 cm

## 2021-01-30 LAB — I-STAT ARTERIAL BLOOD GAS, ED
Acid-base deficit: 1 mmol/L (ref 0.0–2.0)
Bicarbonate: 25.2 mmol/L (ref 20.0–28.0)
Calcium, Ion: 1.3 mmol/L (ref 1.15–1.40)
HCT: 30 % — ABNORMAL LOW (ref 39.0–52.0)
Hemoglobin: 10.2 g/dL — ABNORMAL LOW (ref 13.0–17.0)
O2 Saturation: 94 %
Patient temperature: 98.6
Potassium: 4.3 mmol/L (ref 3.5–5.1)
Sodium: 141 mmol/L (ref 135–145)
TCO2: 27 mmol/L (ref 22–32)
pCO2 arterial: 50.2 mmHg — ABNORMAL HIGH (ref 32.0–48.0)
pH, Arterial: 7.308 — ABNORMAL LOW (ref 7.350–7.450)
pO2, Arterial: 77 mmHg — ABNORMAL LOW (ref 83.0–108.0)

## 2021-01-30 LAB — URINALYSIS, ROUTINE W REFLEX MICROSCOPIC
Bacteria, UA: NONE SEEN
Bilirubin Urine: NEGATIVE
Glucose, UA: 50 mg/dL — AB
Hgb urine dipstick: NEGATIVE
Ketones, ur: NEGATIVE mg/dL
Nitrite: NEGATIVE
Protein, ur: 100 mg/dL — AB
Specific Gravity, Urine: 1.012 (ref 1.005–1.030)
pH: 6 (ref 5.0–8.0)

## 2021-01-30 LAB — CBG MONITORING, ED
Glucose-Capillary: 163 mg/dL — ABNORMAL HIGH (ref 70–99)
Glucose-Capillary: 180 mg/dL — ABNORMAL HIGH (ref 70–99)
Glucose-Capillary: 232 mg/dL — ABNORMAL HIGH (ref 70–99)

## 2021-01-30 LAB — RESP PANEL BY RT-PCR (FLU A&B, COVID) ARPGX2
Influenza A by PCR: NEGATIVE
Influenza B by PCR: NEGATIVE
SARS Coronavirus 2 by RT PCR: NEGATIVE

## 2021-01-30 LAB — D-DIMER, QUANTITATIVE: D-Dimer, Quant: 1.45 ug/mL-FEU — ABNORMAL HIGH (ref 0.00–0.50)

## 2021-01-30 LAB — PROCALCITONIN: Procalcitonin: 0.31 ng/mL

## 2021-01-30 LAB — GLUCOSE, CAPILLARY: Glucose-Capillary: 202 mg/dL — ABNORMAL HIGH (ref 70–99)

## 2021-01-30 MED ORDER — ISOSORBIDE MONONITRATE ER 60 MG PO TB24
60.0000 mg | ORAL_TABLET | Freq: Every day | ORAL | Status: DC
  Administered 2021-01-30 – 2021-02-01 (×3): 60 mg via ORAL
  Filled 2021-01-30: qty 1
  Filled 2021-01-30: qty 2
  Filled 2021-01-30: qty 1

## 2021-01-30 MED ORDER — HYDRALAZINE HCL 20 MG/ML IJ SOLN: 10.0000 mg | INTRAMUSCULAR | Status: DC | PRN

## 2021-01-30 MED ORDER — FUROSEMIDE 10 MG/ML IJ SOLN
40.0000 mg | Freq: Once | INTRAMUSCULAR | Status: AC
  Administered 2021-01-30: 40 mg via INTRAVENOUS
  Filled 2021-01-30: qty 4

## 2021-01-30 MED ORDER — AZITHROMYCIN 250 MG PO TABS
500.0000 mg | ORAL_TABLET | Freq: Every day | ORAL | Status: DC
  Administered 2021-01-30 – 2021-02-01 (×3): 500 mg via ORAL
  Filled 2021-01-30 (×3): qty 2

## 2021-01-30 MED ORDER — CLONIDINE HCL 0.1 MG PO TABS
0.1000 mg | ORAL_TABLET | Freq: Every day | ORAL | Status: DC
  Administered 2021-01-30 – 2021-02-01 (×3): 0.1 mg via ORAL
  Filled 2021-01-30 (×3): qty 1

## 2021-01-30 MED ORDER — ALBUTEROL SULFATE HFA 108 (90 BASE) MCG/ACT IN AERS: 2.0000 | INHALATION_SPRAY | Freq: Once | RESPIRATORY_TRACT | Status: DC

## 2021-01-30 MED ORDER — DORZOLAMIDE HCL 2 % OP SOLN
1.0000 [drp] | Freq: Two times a day (BID) | OPHTHALMIC | Status: DC
  Administered 2021-01-30 – 2021-02-01 (×5): 1 [drp] via OPHTHALMIC
  Filled 2021-01-30 (×2): qty 10

## 2021-01-30 MED ORDER — LATANOPROST 0.005 % OP SOLN
1.0000 [drp] | Freq: Every day | OPHTHALMIC | Status: DC
  Administered 2021-01-30 – 2021-01-31 (×2): 1 [drp] via OPHTHALMIC
  Filled 2021-01-30: qty 2.5

## 2021-01-30 MED ORDER — VITAMIN D 25 MCG (1000 UNIT) PO TABS
1000.0000 [IU] | ORAL_TABLET | Freq: Every day | ORAL | Status: DC
  Administered 2021-01-30 – 2021-02-01 (×3): 1000 [IU] via ORAL
  Filled 2021-01-30 (×3): qty 1

## 2021-01-30 MED ORDER — HEPARIN SODIUM (PORCINE) 5000 UNIT/ML IJ SOLN
5000.0000 [IU] | Freq: Three times a day (TID) | INTRAMUSCULAR | Status: DC
  Administered 2021-01-30 – 2021-02-01 (×8): 5000 [IU] via SUBCUTANEOUS
  Filled 2021-01-30 (×9): qty 1

## 2021-01-30 MED ORDER — ASPIRIN EC 81 MG PO TBEC
81.0000 mg | DELAYED_RELEASE_TABLET | Freq: Every day | ORAL | Status: DC
  Administered 2021-01-30 – 2021-02-01 (×3): 81 mg via ORAL
  Filled 2021-01-30 (×3): qty 1

## 2021-01-30 MED ORDER — AMLODIPINE BESYLATE 10 MG PO TABS
10.0000 mg | ORAL_TABLET | Freq: Every day | ORAL | Status: DC
Start: 2021-01-30 — End: 2021-02-01
  Administered 2021-01-30 – 2021-02-01 (×3): 10 mg via ORAL
  Filled 2021-01-30: qty 1
  Filled 2021-01-30: qty 2
  Filled 2021-01-30: qty 1

## 2021-01-30 MED ORDER — SODIUM CHLORIDE 0.9 % IV SOLN
1.0000 g | INTRAVENOUS | Status: DC
  Administered 2021-01-31 – 2021-02-01 (×2): 1 g via INTRAVENOUS
  Filled 2021-01-30 (×2): qty 10
  Filled 2021-01-30 (×2): qty 1

## 2021-01-30 MED ORDER — SODIUM CHLORIDE 0.9 % IV SOLN
1.0000 g | Freq: Once | INTRAVENOUS | Status: AC
  Administered 2021-01-30: 1 g via INTRAVENOUS
  Filled 2021-01-30: qty 10

## 2021-01-30 MED ORDER — INSULIN ASPART 100 UNIT/ML IJ SOLN
0.0000 [IU] | Freq: Every day | INTRAMUSCULAR | Status: DC
  Administered 2021-01-30: 2 [IU] via SUBCUTANEOUS

## 2021-01-30 MED ORDER — INSULIN GLARGINE-YFGN 100 UNIT/ML ~~LOC~~ SOLN
18.0000 [IU] | Freq: Every day | SUBCUTANEOUS | Status: DC
  Administered 2021-01-30 – 2021-01-31 (×2): 18 [IU] via SUBCUTANEOUS
  Filled 2021-01-30 (×3): qty 0.18

## 2021-01-30 MED ORDER — CLONIDINE HCL 0.2 MG PO TABS
0.2000 mg | ORAL_TABLET | Freq: Every day | ORAL | Status: DC
  Administered 2021-01-30 – 2021-01-31 (×2): 0.2 mg via ORAL
  Filled 2021-01-30 (×2): qty 1

## 2021-01-30 MED ORDER — INSULIN ASPART 100 UNIT/ML IJ SOLN
0.0000 [IU] | Freq: Three times a day (TID) | INTRAMUSCULAR | Status: DC
  Administered 2021-01-30 (×2): 3 [IU] via SUBCUTANEOUS
  Administered 2021-01-30: 5 [IU] via SUBCUTANEOUS
  Administered 2021-01-31: 3 [IU] via SUBCUTANEOUS
  Administered 2021-02-01: 2 [IU] via SUBCUTANEOUS

## 2021-01-30 MED ORDER — HYDRALAZINE HCL 50 MG PO TABS
100.0000 mg | ORAL_TABLET | Freq: Three times a day (TID) | ORAL | Status: DC
  Administered 2021-01-30 – 2021-02-01 (×7): 100 mg via ORAL
  Filled 2021-01-30 (×7): qty 2

## 2021-01-30 MED ORDER — HYDRALAZINE HCL 20 MG/ML IJ SOLN
10.0000 mg | Freq: Once | INTRAMUSCULAR | Status: AC
  Administered 2021-01-30: 10 mg via INTRAVENOUS
  Filled 2021-01-30: qty 1

## 2021-01-30 MED ORDER — BRIMONIDINE TARTRATE 0.2 % OP SOLN
1.0000 [drp] | Freq: Two times a day (BID) | OPHTHALMIC | Status: DC
  Administered 2021-01-30 – 2021-02-01 (×5): 1 [drp] via OPHTHALMIC
  Filled 2021-01-30 (×2): qty 5

## 2021-01-30 MED ORDER — CARVEDILOL 25 MG PO TABS
50.0000 mg | ORAL_TABLET | Freq: Two times a day (BID) | ORAL | Status: DC
  Administered 2021-01-30 – 2021-02-01 (×5): 50 mg via ORAL
  Filled 2021-01-30 (×4): qty 2
  Filled 2021-01-30: qty 4

## 2021-01-30 MED ORDER — TECHNETIUM TO 99M ALBUMIN AGGREGATED
4.0000 | Freq: Once | INTRAVENOUS | Status: AC | PRN
  Administered 2021-01-30: 4 via INTRAVENOUS

## 2021-01-30 MED ORDER — ATORVASTATIN CALCIUM 10 MG PO TABS
20.0000 mg | ORAL_TABLET | Freq: Every day | ORAL | Status: DC
  Administered 2021-01-30 – 2021-02-01 (×3): 20 mg via ORAL
  Filled 2021-01-30 (×3): qty 2

## 2021-01-30 MED ORDER — INSULIN GLARGINE 300 UNIT/ML ~~LOC~~ SOPN: 18.0000 [IU] | PEN_INJECTOR | Freq: Every day | SUBCUTANEOUS | Status: DC

## 2021-01-30 MED ORDER — FUROSEMIDE 10 MG/ML IJ SOLN: 40.0000 mg | Freq: Two times a day (BID) | INTRAMUSCULAR | Status: DC
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
## 2021-01-30 NOTE — Progress Notes (Signed)
Echocardiogram 2D Echocardiogram has been performed.  Darlina Sicilian M 01/30/2021, 10:55 AM

## 2021-01-30 NOTE — Plan of Care (Signed)

## 2021-01-30 NOTE — ED Notes (Signed)
Attempt to call report for 2nd time, [facility]UC states RN will call right back

## 2021-01-30 NOTE — ED Notes (Signed)
Pt given food per request

## 2021-01-30 NOTE — ED Notes (Signed)
Pt placed on 6L via [place]. SpO2 98% on 6L. Pt resting comfortably, appears in NAD. Denies complaints at this time.

## 2021-01-30 NOTE — ED Notes (Signed)
Patient transported to CT 

## 2021-01-30 NOTE — ED Notes (Signed)
Attempted to call report to RN on sw, states they will call right back

## 2021-01-30 NOTE — ED Notes (Signed)
Pt back from nuc med.
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
## 2021-01-30 NOTE — ED Notes (Signed)
RN called to room by NT for patient desaturation. Pt noted to be 85-89% on 6 lpm O2 via . Pt sat up in bed and endorsed SOB. Placed on NRB at 15 lpm.

## 2021-01-30 NOTE — Progress Notes (Signed)
Patient taken off bipap and placed on 6L [place]. Vitals are stable. Patient states he feels fine. RN made aware.

## 2021-01-30 NOTE — ED Notes (Signed)
Pt transported to nuc med  

## 2021-01-30 NOTE — ED Notes (Signed)
Discussed medication administration with ED pharmacist about warnings with PO clonidine. Advised okay to give.
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
## 2021-01-30 NOTE — ED Notes (Signed)
Pt repositioned in bed, moved to fowlers, O2 increased from 4-5LPM secondary to SPO2 86%. Pt reports relief with these interventions

## 2021-01-30 NOTE — ED Notes (Signed)
Discussed with Dr. [place] about acute change in BP and pt c/o SOB and chest tightness. Pt placed back on 15 lpm O2 via NRB with saturations at 100%. New orders to be placed by MD for chest x-ray and to notify if SBP drops below 100.

## 2021-01-30 NOTE — Consult Note (Addendum)
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 well nourished, NAD  HEENT: OP clear, mucus membranes moist  SKIN: warm, dry. No rashes.  Neuro: No focal deficits  Musculoskeletal: Muscle  strength 5/5 all ext  Psychiatric: Mood and affect normal  Neck: No JVDLungs: Crackles through bases bilaterally.  Cardiovascular: Regular rate and rhythm. No murmurs, gallops or rubs.  Abdomen:Soft. Bowel sounds present. Non-tender.  Extremities: Trace bilateral lower extremity edema.   Syncope: Unclear etiology of syncope. He has done very well at home recently. No evidence of arrhythmia at this time. It is likely that his syncope was driven by hypoxia while not wearing his supplemental O2. Given history of hypertrophic CM, agree with repeating an echo. He may need outpatient cardiac monitoring.   Lauree Chandler, MD, [facility]  01/30/2021 1:43 PM

## 2021-01-30 NOTE — Progress Notes (Signed)
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 mg Oral BID   cholecalciferol  1,000 Units Oral Daily   cloNIDine  0.1 mg Oral Daily   cloNIDine  0.2 mg Oral QHS   dorzolamide  1 drop Both Eyes BID   furosemide  40 mg Intravenous BID   heparin  5,000 Units Subcutaneous Q8H   hydrALAZINE  100 mg Oral TID   insulin aspart  0-15 Units Subcutaneous TID WC   insulin aspart  0-5 Units Subcutaneous  QHS   Insulin Glargine  18 Units Subcutaneous QHS   isosorbide mononitrate  60 mg Oral Daily   latanoprost  1 drop Left Eye QHS   Continuous Infusions:  [START ON 01/31/2021] cefTRIAXone (ROCEPHIN)  IV       LOS: 0 days    Time spent: 43 minutes spent on chart review, discussion with nursing staff, consultants, updating family and interview/physical exam; more than 50% of that time was spent in counseling and/or coordination of care.    Darleny Sem J [place] DO [facility] Available via Epic secure chat 7am-7pm After these hours, please refer to coverage provider listed on  [email or web address] 01/30/2021, 2:33 PM

## 2021-01-30 NOTE — ED Notes (Signed)
Pt placed on 6 lpm O2 via [place] again with no respiratory distress. Oxygen saturation maintained between 94 and 96%
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
## 2021-01-31 ENCOUNTER — Inpatient Hospital Stay: Payer: PPO

## 2021-01-31 ENCOUNTER — Telehealth: Payer: Self-pay | Admitting: Pulmonary Disease

## 2021-01-31 ENCOUNTER — Other Ambulatory Visit: Payer: Self-pay | Admitting: Home Health

## 2021-01-31 DIAGNOSIS — J189 Pneumonia, unspecified organism: Secondary | ICD-10-CM | POA: Diagnosis not present

## 2021-01-31 DIAGNOSIS — I421 Obstructive hypertrophic cardiomyopathy: Secondary | ICD-10-CM | POA: Diagnosis not present

## 2021-01-31 DIAGNOSIS — J9601 Acute respiratory failure with hypoxia: Secondary | ICD-10-CM | POA: Diagnosis not present

## 2021-01-31 DIAGNOSIS — R55 Syncope and collapse: Secondary | ICD-10-CM

## 2021-01-31 DIAGNOSIS — J9621 Acute and chronic respiratory failure with hypoxia: Secondary | ICD-10-CM | POA: Diagnosis not present

## 2021-01-31 DIAGNOSIS — I422 Other hypertrophic cardiomyopathy: Secondary | ICD-10-CM

## 2021-01-31 LAB — BASIC METABOLIC PANEL
Anion gap: 6 (ref 5–15)
BUN: 41 mg/dL — ABNORMAL HIGH (ref 8–23)
CO2: 24 mmol/L (ref 22–32)
Calcium: 8.6 mg/dL — ABNORMAL LOW (ref 8.9–10.3)
Chloride: 108 mmol/L (ref 98–111)
Creatinine, Ser: 2.67 mg/dL — ABNORMAL HIGH (ref 0.61–1.24)
GFR, Estimated: 25 mL/min — ABNORMAL LOW (ref >60)
Glucose, Bld: 70 mg/dL (ref 70–99)
Potassium: 4.4 mmol/L (ref 3.5–5.1)
Sodium: 138 mmol/L (ref 135–145)

## 2021-01-31 LAB — GLUCOSE, CAPILLARY
Glucose-Capillary: 75 mg/dL (ref 70–99)
Glucose-Capillary: 161 mg/dL — ABNORMAL HIGH (ref 70–99)
Glucose-Capillary: 75 mg/dL (ref 70–99)
Glucose-Capillary: 135 mg/dL — ABNORMAL HIGH (ref 70–99)

## 2021-01-31 LAB — CBC
HCT: 25.8 % — ABNORMAL LOW (ref 39.0–52.0)
Hemoglobin: 7.6 g/dL — ABNORMAL LOW (ref 13.0–17.0)
MCH: 27.8 pg (ref 26.0–34.0)
MCHC: 29.5 g/dL — ABNORMAL LOW (ref 30.0–36.0)
MCV: 94.5 fL (ref 80.0–100.0)
Platelets: 187 10*3/uL (ref 150–400)
RBC: 2.73 MIL/uL — ABNORMAL LOW (ref 4.22–5.81)
RDW: 15.9 % — ABNORMAL HIGH (ref 11.5–15.5)
WBC: 5.2 10*3/uL (ref 4.0–10.5)
nRBC: 0 % (ref 0.0–0.2)

## 2021-01-31 LAB — MAGNESIUM: Magnesium: 2.1 mg/dL (ref 1.7–2.4)

## 2021-01-31 LAB — PROCALCITONIN: Procalcitonin: 1.69 ng/mL

## 2021-01-31 NOTE — Progress Notes (Signed)
Zio patch for outpatient x14 days monitor ordered, will forward to Dr Oval Linsey to read and follow up at [facility] office made on 02/21/21.

## 2021-01-31 NOTE — Plan of Care (Signed)
Follow up appointment arranged on 02/21/21 at [facility] location, patient wishes to follow Dr Oval Linsey, will see Ms Gilford Rile (first available appt). Zio patch x14 days monitor ordered, message sent to coordinators to arrange placement.
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
## 2021-01-31 NOTE — Telephone Encounter (Signed)
LMTCB   Appt set for 02/28/21 at 10:30 with NP Volanda Napoleon. Can reschedule if this does not work for patient.

## 2021-01-31 NOTE — Telephone Encounter (Signed)
Please arrange outpatient follow-up with APP/me in 4 to 6 weeks with chest x-ray to follow-up on pneumonia
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
## 2021-01-31 NOTE — Progress Notes (Unsigned)
Enrolled patient for a 14 day Zio XT  monitor to be mailed to patients home  °
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
## 2021-02-01 DIAGNOSIS — I160 Hypertensive urgency: Secondary | ICD-10-CM | POA: Diagnosis not present

## 2021-02-01 DIAGNOSIS — I421 Obstructive hypertrophic cardiomyopathy: Secondary | ICD-10-CM | POA: Diagnosis not present

## 2021-02-01 DIAGNOSIS — R55 Syncope and collapse: Secondary | ICD-10-CM | POA: Diagnosis not present

## 2021-02-01 LAB — PROCALCITONIN: Procalcitonin: 1.69 ng/mL

## 2021-02-01 LAB — GLUCOSE, CAPILLARY
Glucose-Capillary: 66 mg/dL — ABNORMAL LOW (ref 70–99)
Glucose-Capillary: 97 mg/dL (ref 70–99)
Glucose-Capillary: 147 mg/dL — ABNORMAL HIGH (ref 70–99)

## 2021-02-01 MED ORDER — POLYETHYLENE GLYCOL 3350 17 G PO PACK
17.0000 g | PACK | Freq: Once | ORAL | Status: AC
  Administered 2021-02-01: 17 g via ORAL

## 2021-02-01 MED ORDER — DOXYCYCLINE HYCLATE 100 MG PO TABS: 100.0000 mg | ORAL_TABLET | Freq: Two times a day (BID) | ORAL | 0 refills | Status: AC

## 2021-02-01 MED ORDER — CEFDINIR 300 MG PO CAPS: 300.0000 mg | ORAL_CAPSULE | Freq: Every day | ORAL | 0 refills | Status: AC

## 2021-02-01 MED ORDER — CEFDINIR 300 MG PO CAPS: 300.0000 mg | ORAL_CAPSULE | Freq: Two times a day (BID) | ORAL | 0 refills | Status: DC
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
## 2021-02-01 NOTE — Progress Notes (Signed)
Inpatient Diabetes Program Recommendations  AACE/ADA: New Consensus Statement on Inpatient Glycemic Control (2015)  Target Ranges:  Prepandial:   less than 140 mg/dL      Peak postprandial:   less than 180 mg/dL (1-2 hours)      Critically ill patients:  140 - 180 mg/dL   Lab Results  Component Value Date   GLUCAP 97 02/01/2021   HGBA1C 5.8 (A) 01/12/2021    Review of Glycemic Control Results for Robert Chaney, Robert Chaney (MRN [id number]) as of 02/01/2021 10:40  Ref. Range 01/31/2021 07:50 01/31/2021 12:00 01/31/2021 16:39 01/31/2021 22:48 02/01/2021 07:30 02/01/2021 08:01  Glucose-Capillary Latest Ref Range: 70 - 99 mg/dL 75 161 (H) 75 135 (H) 66 (L) 97   Diabetes history: DM 2 Outpatient Diabetes medications: Dexcom CGM, Novolog 6 units tid, Insulin Glargine 18 units qhs Current orders for Inpatient glycemic control:  Semglee 18 units qhs Novolog 0-15 units tid + hs  A1c 5.8% on 7/22  Inpatient Diabetes Program Recommendations:    Hypoglycemia 66 this am and overall lower glucose trends  -   reduce Semglee to 15 units -   reduce Novolog Correction to 0-9 units tid + hs  Thanks,  Tama Headings RN, MSN, BC-ADM Inpatient Diabetes Coordinator Team Pager [phone] (8a-5p)

## 2021-02-01 NOTE — Discharge Summary (Signed)
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 cardiology clinic with Dr. Oval Linsey as scheduled.  Low-salt diet.  Fluid restriction.  Complete course of antibiotic.  Follow-up  with pulmonary clinic as outpatient to follow-up on the CT scan report of the chest. Continue oxygen at home   Heart Failure patients record your daily weight using the same scale at the same time of day   Complete by: As directed    Increase activity slowly   Complete by: As directed    STOP any activity that causes chest pain, shortness of breath, dizziness, sweating, or exessive weakness   Complete by: As directed       Allergies as of 02/01/2021       Reactions   Aspirin Itching        Medication List     STOP taking these medications    levofloxacin 500 MG tablet Commonly known as: LEVAQUIN       TAKE these medications    amLODipine 10 MG tablet Commonly known as: NORVASC Take 1 tablet (10 mg total) by mouth daily.   aspirin EC 81 MG tablet Take 1 tablet (81 mg total) by mouth daily. Swallow  whole.   atorvastatin 20 MG tablet Commonly known as: LIPITOR Take 1 tablet (20 mg total) by mouth daily.   brimonidine 0.2 % ophthalmic solution Commonly known as: ALPHAGAN Place 1 drop into the right eye 2 (two) times daily.   carvedilol 25 MG tablet Commonly known as: COREG Take 2 tablets (50 mg total) by mouth 2 (two) times daily.   cefdinir 300 MG capsule Commonly known as: OMNICEF Take 1 capsule (300 mg total) by mouth daily for 5 days.   cholecalciferol 25 MCG (1000 UNIT) tablet Commonly known as: VITAMIN D3 Take 1,000 Units by mouth daily.   cloNIDine 0.1 MG tablet Commonly known as: CATAPRES Take 0.1-0.2 mg by mouth See admin instructions. 0.1 mg in the morning 0.2 mg at bedtime What changed: Another medication with the same name was removed. Continue taking this medication, and follow the directions you see here.   Dexcom G6 Sensor Misc 1 Device by Does not apply route as directed.   Dexcom G6 Transmitter Misc 1 Device by Does not apply route as directed.   diclofenac Sodium 1 % Gel Commonly known as: VOLTAREN Apply 4 g topically 4 (four) times daily as needed for pain.   dorzolamide 2 % ophthalmic solution Commonly known as: TRUSOPT Place 1 drop into both eyes 2 (two) times daily.   doxycycline 100 MG tablet Commonly known as: VIBRA-TABS Take 1 tablet (100 mg total) by mouth 2 (two) times daily for 5 days.   furosemide 40 MG tablet Commonly known as: LASIX Take 1 tablet (40 mg total) by mouth 2 (two) times daily.   hydrALAZINE 100 MG tablet Commonly known as: APRESOLINE Take 1 tablet (100 mg total) by mouth 3 (three) times daily.   Insulin Glargine 300 UNIT/ML Sopn Inject 18 Units into the skin at bedtime.   isosorbide mononitrate 60 MG 24 hr tablet Commonly known as: IMDUR Take 60 mg by mouth daily.   latanoprost 0.005 % ophthalmic solution Commonly known as: XALATAN Place 1 drop into the left eye at bedtime.   nitroGLYCERIN 0.4 MG SL  tablet Commonly known as: NITROSTAT DISSOLVE ONE TABLET UNDER THE TONGUE EVERY 5 MINUTES AS NEEDED FOR CHEST PAIN. What changed: See the new instructions.   NovoLOG ReliOn 100 UNIT/ML injection Generic drug: insulin aspart Inject 6 Units into the skin with breakfast, with lunch, and with evening meal.   OneTouch Verio test strip Generic drug: glucose blood USE 1 STRIP TO CHECK GLUCOSE THREE  TIMES DAILY AS DIRECTED        Follow-up Information     [facility] Follow up on 02/21/2021.   Specialty: Cardiology Why: at 8:30 AM for your post hospital follow up with cardiology, with Ms Gilford Rile. Contact information: [address] [place] [address] [phone]        Rigoberto Noel, MD Follow up on 02/28/2021.   Specialty: Pulmonary Disease Why: 02/28/21 at 10:30 with NP Volanda Napoleon. Contact information: [address] [place] [address] [phone]         [name], MD. Schedule an appointment as soon as possible for a visit in 1 week(s).   Specialty: Internal Medicine Why: regular followup Contact information: [place] [address] [phone]                  Time coordinating discharge: 39 minutes  Signed:  Shrey Boike  [facility] 02/01/2021, 9:16 AM

## 2021-02-01 NOTE — Progress Notes (Signed)
Pt weaned to 4L and sustaining oxygen saturation at 99%. Pt previously on 5L.

## 2021-02-01 NOTE — Progress Notes (Signed)
Hypoglycemic Event  CBG: 66  Treatment: 4 oz juice/soda  Symptoms: None  Follow-up CBG: Time:0801 CBG Result:97  Possible Reasons for Event: Unknown  Comments/MD notified:Yes    Robert Chaney

## 2021-02-04 LAB — CULTURE, BLOOD (ROUTINE X 2)
Culture: NO GROWTH
Culture: NO GROWTH
Special Requests: ADEQUATE

## 2021-02-13 DIAGNOSIS — G4733 Obstructive sleep apnea (adult) (pediatric): Secondary | ICD-10-CM | POA: Diagnosis not present

## 2021-02-13 DIAGNOSIS — I5032 Chronic diastolic (congestive) heart failure: Secondary | ICD-10-CM | POA: Diagnosis not present

## 2021-02-19 ENCOUNTER — Encounter (INDEPENDENT_AMBULATORY_CARE_PROVIDER_SITE_OTHER): Payer: Self-pay | Admitting: Ophthalmology

## 2021-02-19 ENCOUNTER — Encounter (INDEPENDENT_AMBULATORY_CARE_PROVIDER_SITE_OTHER): Payer: Self-pay

## 2021-02-19 DIAGNOSIS — H3581 Retinal edema: Secondary | ICD-10-CM

## 2021-02-19 DIAGNOSIS — G4733 Obstructive sleep apnea (adult) (pediatric): Secondary | ICD-10-CM | POA: Diagnosis not present

## 2021-02-19 DIAGNOSIS — I5032 Chronic diastolic (congestive) heart failure: Secondary | ICD-10-CM | POA: Diagnosis not present

## 2021-02-21 ENCOUNTER — Ambulatory Visit (HOSPITAL_BASED_OUTPATIENT_CLINIC_OR_DEPARTMENT_OTHER): Payer: PPO | Admitting: Family
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
## 2021-02-28 ENCOUNTER — Emergency Department (HOSPITAL_COMMUNITY): Payer: PPO

## 2021-02-28 ENCOUNTER — Encounter (HOSPITAL_COMMUNITY): Payer: Self-pay | Admitting: Emergency Medicine

## 2021-02-28 ENCOUNTER — Inpatient Hospital Stay (HOSPITAL_COMMUNITY)
Admission: EM | Admit: 2021-02-28 | Discharge: 2021-03-10 | DRG: 280 | Disposition: A | Discharge status: Home Health | Payer: PPO | Attending: Internal Medicine | Admitting: Internal Medicine

## 2021-02-28 ENCOUNTER — Inpatient Hospital Stay: Payer: Self-pay | Admitting: Primary Care

## 2021-02-28 DIAGNOSIS — Z66 Do not resuscitate: Secondary | ICD-10-CM | POA: Diagnosis present

## 2021-02-28 DIAGNOSIS — J984 Other disorders of lung: Secondary | ICD-10-CM | POA: Diagnosis not present

## 2021-02-28 DIAGNOSIS — E875 Hyperkalemia: Secondary | ICD-10-CM | POA: Diagnosis not present

## 2021-02-28 DIAGNOSIS — J189 Pneumonia, unspecified organism: Secondary | ICD-10-CM | POA: Diagnosis not present

## 2021-02-28 DIAGNOSIS — Z794 Long term (current) use of insulin: Secondary | ICD-10-CM

## 2021-02-28 DIAGNOSIS — I700 Atherosclerosis of aorta: Secondary | ICD-10-CM | POA: Diagnosis present

## 2021-02-28 DIAGNOSIS — Z8249 Family history of ischemic heart disease and other diseases of the circulatory system: Secondary | ICD-10-CM | POA: Diagnosis not present

## 2021-02-28 DIAGNOSIS — J810 Acute pulmonary edema: Secondary | ICD-10-CM | POA: Diagnosis not present

## 2021-02-28 DIAGNOSIS — I421 Obstructive hypertrophic cardiomyopathy: Secondary | ICD-10-CM | POA: Diagnosis present

## 2021-02-28 DIAGNOSIS — E8809 Other disorders of plasma-protein metabolism, not elsewhere classified: Secondary | ICD-10-CM | POA: Diagnosis not present

## 2021-02-28 DIAGNOSIS — Z823 Family history of stroke: Secondary | ICD-10-CM

## 2021-02-28 DIAGNOSIS — E7800 Pure hypercholesterolemia, unspecified: Secondary | ICD-10-CM | POA: Diagnosis present

## 2021-02-28 DIAGNOSIS — I422 Other hypertrophic cardiomyopathy: Secondary | ICD-10-CM | POA: Diagnosis not present

## 2021-02-28 DIAGNOSIS — Z9889 Other specified postprocedural states: Secondary | ICD-10-CM

## 2021-02-28 DIAGNOSIS — E11319 Type 2 diabetes mellitus with unspecified diabetic retinopathy without macular edema: Secondary | ICD-10-CM | POA: Diagnosis not present

## 2021-02-28 DIAGNOSIS — G4733 Obstructive sleep apnea (adult) (pediatric): Secondary | ICD-10-CM | POA: Diagnosis present

## 2021-02-28 DIAGNOSIS — K2971 Gastritis, unspecified, with bleeding: Secondary | ICD-10-CM | POA: Diagnosis not present

## 2021-02-28 DIAGNOSIS — J811 Chronic pulmonary edema: Secondary | ICD-10-CM | POA: Diagnosis not present

## 2021-02-28 DIAGNOSIS — I2510 Atherosclerotic heart disease of native coronary artery without angina pectoris: Secondary | ICD-10-CM | POA: Diagnosis present

## 2021-02-28 DIAGNOSIS — Z9981 Dependence on supplemental oxygen: Secondary | ICD-10-CM

## 2021-02-28 DIAGNOSIS — K2950 Unspecified chronic gastritis without bleeding: Secondary | ICD-10-CM | POA: Diagnosis present

## 2021-02-28 DIAGNOSIS — Z20822 Contact with and (suspected) exposure to covid-19: Secondary | ICD-10-CM | POA: Diagnosis not present

## 2021-02-28 DIAGNOSIS — R0902 Hypoxemia: Secondary | ICD-10-CM | POA: Diagnosis not present

## 2021-02-28 DIAGNOSIS — E1122 Type 2 diabetes mellitus with diabetic chronic kidney disease: Secondary | ICD-10-CM | POA: Diagnosis present

## 2021-02-28 DIAGNOSIS — D175 Benign lipomatous neoplasm of intra-abdominal organs: Secondary | ICD-10-CM | POA: Diagnosis not present

## 2021-02-28 DIAGNOSIS — I509 Heart failure, unspecified: Secondary | ICD-10-CM

## 2021-02-28 DIAGNOSIS — I959 Hypotension, unspecified: Secondary | ICD-10-CM | POA: Diagnosis not present

## 2021-02-28 DIAGNOSIS — K3189 Other diseases of stomach and duodenum: Secondary | ICD-10-CM | POA: Diagnosis not present

## 2021-02-28 DIAGNOSIS — K319 Disease of stomach and duodenum, unspecified: Secondary | ICD-10-CM | POA: Diagnosis not present

## 2021-02-28 DIAGNOSIS — J9621 Acute and chronic respiratory failure with hypoxia: Secondary | ICD-10-CM | POA: Diagnosis not present

## 2021-02-28 DIAGNOSIS — K2951 Unspecified chronic gastritis with bleeding: Secondary | ICD-10-CM | POA: Diagnosis not present

## 2021-02-28 DIAGNOSIS — K5900 Constipation, unspecified: Secondary | ICD-10-CM | POA: Diagnosis not present

## 2021-02-28 DIAGNOSIS — R918 Other nonspecific abnormal finding of lung field: Secondary | ICD-10-CM | POA: Diagnosis not present

## 2021-02-28 DIAGNOSIS — J9622 Acute and chronic respiratory failure with hypercapnia: Secondary | ICD-10-CM | POA: Diagnosis present

## 2021-02-28 DIAGNOSIS — Z79899 Other long term (current) drug therapy: Secondary | ICD-10-CM

## 2021-02-28 DIAGNOSIS — D631 Anemia in chronic kidney disease: Secondary | ICD-10-CM | POA: Diagnosis present

## 2021-02-28 DIAGNOSIS — I701 Atherosclerosis of renal artery: Secondary | ICD-10-CM | POA: Diagnosis not present

## 2021-02-28 DIAGNOSIS — I517 Cardiomegaly: Secondary | ICD-10-CM | POA: Diagnosis not present

## 2021-02-28 DIAGNOSIS — K2970 Gastritis, unspecified, without bleeding: Secondary | ICD-10-CM | POA: Diagnosis not present

## 2021-02-28 DIAGNOSIS — I21A1 Myocardial infarction type 2: Secondary | ICD-10-CM | POA: Diagnosis present

## 2021-02-28 DIAGNOSIS — D649 Anemia, unspecified: Secondary | ICD-10-CM | POA: Diagnosis not present

## 2021-02-28 DIAGNOSIS — Z419 Encounter for procedure for purposes other than remedying health state, unspecified: Secondary | ICD-10-CM

## 2021-02-28 DIAGNOSIS — D500 Iron deficiency anemia secondary to blood loss (chronic): Secondary | ICD-10-CM | POA: Diagnosis present

## 2021-02-28 DIAGNOSIS — R531 Weakness: Secondary | ICD-10-CM | POA: Diagnosis not present

## 2021-02-28 DIAGNOSIS — R911 Solitary pulmonary nodule: Secondary | ICD-10-CM | POA: Diagnosis not present

## 2021-02-28 DIAGNOSIS — N179 Acute kidney failure, unspecified: Secondary | ICD-10-CM | POA: Diagnosis not present

## 2021-02-28 DIAGNOSIS — J9601 Acute respiratory failure with hypoxia: Secondary | ICD-10-CM

## 2021-02-28 DIAGNOSIS — J9811 Atelectasis: Secondary | ICD-10-CM | POA: Diagnosis not present

## 2021-02-28 DIAGNOSIS — I130 Hypertensive heart and chronic kidney disease with heart failure and stage 1 through stage 4 chronic kidney disease, or unspecified chronic kidney disease: Principal | ICD-10-CM | POA: Diagnosis present

## 2021-02-28 DIAGNOSIS — R9389 Abnormal findings on diagnostic imaging of other specified body structures: Secondary | ICD-10-CM | POA: Diagnosis not present

## 2021-02-28 DIAGNOSIS — Z833 Family history of diabetes mellitus: Secondary | ICD-10-CM | POA: Diagnosis not present

## 2021-02-28 DIAGNOSIS — I214 Non-ST elevation (NSTEMI) myocardial infarction: Secondary | ICD-10-CM | POA: Diagnosis not present

## 2021-02-28 DIAGNOSIS — J90 Pleural effusion, not elsewhere classified: Secondary | ICD-10-CM | POA: Diagnosis not present

## 2021-02-28 DIAGNOSIS — J80 Acute respiratory distress syndrome: Secondary | ICD-10-CM | POA: Diagnosis not present

## 2021-02-28 DIAGNOSIS — I5033 Acute on chronic diastolic (congestive) heart failure: Secondary | ICD-10-CM | POA: Diagnosis not present

## 2021-02-28 DIAGNOSIS — K921 Melena: Secondary | ICD-10-CM | POA: Diagnosis present

## 2021-02-28 DIAGNOSIS — R778 Other specified abnormalities of plasma proteins: Secondary | ICD-10-CM | POA: Diagnosis present

## 2021-02-28 DIAGNOSIS — N184 Chronic kidney disease, stage 4 (severe): Secondary | ICD-10-CM | POA: Diagnosis not present

## 2021-02-28 DIAGNOSIS — I160 Hypertensive urgency: Secondary | ICD-10-CM | POA: Diagnosis not present

## 2021-02-28 DIAGNOSIS — H409 Unspecified glaucoma: Secondary | ICD-10-CM | POA: Diagnosis present

## 2021-02-28 DIAGNOSIS — I110 Hypertensive heart disease with heart failure: Secondary | ICD-10-CM | POA: Diagnosis not present

## 2021-02-28 DIAGNOSIS — R0602 Shortness of breath: Secondary | ICD-10-CM | POA: Diagnosis present

## 2021-02-28 DIAGNOSIS — I5023 Acute on chronic systolic (congestive) heart failure: Secondary | ICD-10-CM | POA: Diagnosis not present

## 2021-02-28 DIAGNOSIS — I10 Essential (primary) hypertension: Secondary | ICD-10-CM | POA: Diagnosis not present

## 2021-02-28 DIAGNOSIS — Z7982 Long term (current) use of aspirin: Secondary | ICD-10-CM

## 2021-02-28 DIAGNOSIS — R509 Fever, unspecified: Secondary | ICD-10-CM | POA: Diagnosis not present

## 2021-02-28 LAB — I-STAT VENOUS BLOOD GAS, ED
Acid-Base Excess: 1 mmol/L (ref 0.0–2.0)
Bicarbonate: 25.6 mmol/L (ref 20.0–28.0)
Calcium, Ion: 1.16 mmol/L (ref 1.15–1.40)
HCT: 21 % — ABNORMAL LOW (ref 39.0–52.0)
Hemoglobin: 7.1 g/dL — ABNORMAL LOW (ref 13.0–17.0)
O2 Saturation: 99 %
Potassium: 4.7 mmol/L (ref 3.5–5.1)
Sodium: 142 mmol/L (ref 135–145)
TCO2: 27 mmol/L (ref 22–32)
pCO2, Ven: 38.2 mmHg — ABNORMAL LOW (ref 44.0–60.0)
pH, Ven: 7.435 — ABNORMAL HIGH (ref 7.250–7.430)
pO2, Ven: 149 mmHg — ABNORMAL HIGH (ref 32.0–45.0)

## 2021-02-28 LAB — COMPREHENSIVE METABOLIC PANEL
ALT: 74 U/L — ABNORMAL HIGH (ref 0–44)
AST: 74 U/L — ABNORMAL HIGH (ref 15–41)
Albumin: 2.4 g/dL — ABNORMAL LOW (ref 3.5–5.0)
Alkaline Phosphatase: 58 U/L (ref 38–126)
Anion gap: 5 (ref 5–15)
BUN: 41 mg/dL — ABNORMAL HIGH (ref 8–23)
CO2: 24 mmol/L (ref 22–32)
Calcium: 8.3 mg/dL — ABNORMAL LOW (ref 8.9–10.3)
Chloride: 108 mmol/L (ref 98–111)
Creatinine, Ser: 2.93 mg/dL — ABNORMAL HIGH (ref 0.61–1.24)
GFR, Estimated: 22 mL/min — ABNORMAL LOW (ref >60)
Glucose, Bld: 280 mg/dL — ABNORMAL HIGH (ref 70–99)
Potassium: 4.6 mmol/L (ref 3.5–5.1)
Sodium: 137 mmol/L (ref 135–145)
Total Bilirubin: 0.7 mg/dL (ref 0.3–1.2)
Total Protein: 6 g/dL — ABNORMAL LOW (ref 6.5–8.1)

## 2021-02-28 LAB — CBC WITH DIFFERENTIAL/PLATELET
Abs Immature Granulocytes: 0.02 10*3/uL (ref 0.00–0.07)
Basophils Absolute: 0 10*3/uL (ref 0.0–0.1)
Basophils Relative: 1 %
Eosinophils Absolute: 0 10*3/uL (ref 0.0–0.5)
Eosinophils Relative: 1 %
HCT: 22.7 % — ABNORMAL LOW (ref 39.0–52.0)
Hemoglobin: 6.7 g/dL — CL (ref 13.0–17.0)
Immature Granulocytes: 1 %
Lymphocytes Relative: 27 %
Lymphs Abs: 1.2 10*3/uL (ref 0.7–4.0)
MCH: 28 pg (ref 26.0–34.0)
MCHC: 29.5 g/dL — ABNORMAL LOW (ref 30.0–36.0)
MCV: 95 fL (ref 80.0–100.0)
Monocytes Absolute: 0.5 10*3/uL (ref 0.1–1.0)
Monocytes Relative: 10 %
Neutro Abs: 2.7 10*3/uL (ref 1.7–7.7)
Neutrophils Relative %: 60 %
Platelets: 189 10*3/uL (ref 150–400)
RBC: 2.39 MIL/uL — ABNORMAL LOW (ref 4.22–5.81)
RDW: 16.4 % — ABNORMAL HIGH (ref 11.5–15.5)
WBC: 4.4 10*3/uL (ref 4.0–10.5)
nRBC: 0 % (ref 0.0–0.2)

## 2021-02-28 LAB — IRON AND TIBC
Iron: 19 ug/dL — ABNORMAL LOW (ref 45–182)
Saturation Ratios: 6 % — ABNORMAL LOW (ref 17.9–39.5)
TIBC: 339 ug/dL (ref 250–450)
UIBC: 320 ug/dL

## 2021-02-28 LAB — TROPONIN I (HIGH SENSITIVITY)
Troponin I (High Sensitivity): 545 ng/L (ref <18)
Troponin I (High Sensitivity): 546 ng/L (ref <18)

## 2021-02-28 LAB — RESP PANEL BY RT-PCR (FLU A&B, COVID) ARPGX2
Influenza A by PCR: NEGATIVE
Influenza B by PCR: NEGATIVE
SARS Coronavirus 2 by RT PCR: NEGATIVE

## 2021-02-28 LAB — LACTATE DEHYDROGENASE: LDH: 177 U/L (ref 98–192)

## 2021-02-28 LAB — BRAIN NATRIURETIC PEPTIDE: B Natriuretic Peptide: 2327.5 pg/mL — ABNORMAL HIGH (ref 0.0–100.0)

## 2021-02-28 LAB — PROTIME-INR
INR: 1.2 (ref 0.8–1.2)
Prothrombin Time: 14.7 seconds (ref 11.4–15.2)

## 2021-02-28 LAB — FERRITIN: Ferritin: 23 ng/mL — ABNORMAL LOW (ref 24–336)

## 2021-02-28 LAB — LACTIC ACID, PLASMA: Lactic Acid, Venous: 1.1 mmol/L (ref 0.5–1.9)

## 2021-02-28 MED ORDER — ACETAMINOPHEN 325 MG PO TABS
650.0000 mg | ORAL_TABLET | Freq: Four times a day (QID) | ORAL | Status: DC | PRN
  Administered 2021-03-04 – 2021-03-07 (×3): 650 mg via ORAL
  Filled 2021-02-28 (×3): qty 2

## 2021-02-28 MED ORDER — PANTOPRAZOLE SODIUM 40 MG IV SOLR
40.0000 mg | Freq: Once | INTRAVENOUS | Status: AC
  Administered 2021-02-28: 40 mg via INTRAVENOUS
  Filled 2021-02-28: qty 40

## 2021-02-28 MED ORDER — SODIUM CHLORIDE 0.9 % IV SOLN
10.0000 mL/h | Freq: Once | INTRAVENOUS | Status: AC
  Administered 2021-02-28: 10 mL/h via INTRAVENOUS

## 2021-02-28 MED ORDER — INSULIN ASPART 100 UNIT/ML IJ SOLN
0.0000 [IU] | INTRAMUSCULAR | Status: DC
  Administered 2021-03-01: 3 [IU] via SUBCUTANEOUS
  Administered 2021-03-01: 2 [IU] via SUBCUTANEOUS
  Administered 2021-03-01: 5 [IU] via SUBCUTANEOUS

## 2021-02-28 MED ORDER — FUROSEMIDE 10 MG/ML IJ SOLN
40.0000 mg | Freq: Two times a day (BID) | INTRAMUSCULAR | Status: DC
Start: 2021-02-28 — End: 2021-03-04
  Administered 2021-02-28 – 2021-03-04 (×8): 40 mg via INTRAVENOUS
  Filled 2021-02-28 (×8): qty 4

## 2021-02-28 MED ORDER — VANCOMYCIN HCL 1500 MG/300ML IV SOLN
1500.0000 mg | Freq: Once | INTRAVENOUS | Status: AC
  Administered 2021-03-01: 1500 mg via INTRAVENOUS
  Filled 2021-02-28: qty 300

## 2021-02-28 MED ORDER — SODIUM CHLORIDE 0.9 % IV SOLN
100.0000 mg | Freq: Once | INTRAVENOUS | Status: AC
  Administered 2021-02-28: 100 mg via INTRAVENOUS
  Filled 2021-02-28: qty 100

## 2021-02-28 MED ORDER — ACETAMINOPHEN 650 MG RE SUPP: 650.0000 mg | Freq: Four times a day (QID) | RECTAL | Status: DC | PRN

## 2021-02-28 MED ORDER — LACTATED RINGERS IV SOLN: INTRAVENOUS | Status: DC

## 2021-02-28 MED ORDER — PANTOPRAZOLE SODIUM 40 MG IV SOLR
40.0000 mg | Freq: Two times a day (BID) | INTRAVENOUS | Status: DC
  Administered 2021-03-01 – 2021-03-06 (×11): 40 mg via INTRAVENOUS
  Filled 2021-02-28 (×11): qty 40

## 2021-02-28 MED ORDER — SODIUM CHLORIDE 0.9 % IV SOLN
2.0000 g | Freq: Every day | INTRAVENOUS | Status: DC
  Administered 2021-02-28 – 2021-03-01 (×2): 2 g via INTRAVENOUS
  Filled 2021-02-28 (×2): qty 2

## 2021-02-28 MED ORDER — IPRATROPIUM-ALBUTEROL 0.5-2.5 (3) MG/3ML IN SOLN
3.0000 mL | Freq: Once | RESPIRATORY_TRACT | Status: AC
  Administered 2021-02-28: 3 mL via RESPIRATORY_TRACT
  Filled 2021-02-28: qty 3

## 2021-02-28 MED ORDER — SODIUM CHLORIDE 0.9% IV SOLUTION: Freq: Once | INTRAVENOUS | Status: AC

## 2021-02-28 MED ORDER — VANCOMYCIN HCL 750 MG/150ML IV SOLN
750.0000 mg | INTRAVENOUS | Status: DC
  Administered 2021-03-01: 750 mg via INTRAVENOUS
  Filled 2021-02-28: qty 150

## 2021-02-28 NOTE — H&P (Signed)
History and Physical    Robert Chaney XMD:[id number] DOB: 13-Nov-1950 DOA: 02/28/2021  PCP: [name], Robert Chaney   Patient coming from: Home   Chief Complaint: SOB, fevers/chills, dark stool, fatigue   HPI: Robert Chaney SMOLA is a 70 y.o. male with medical history significant for CKD IV, HOCM, HFpEF, IDDM, HTN, OSA, chronic hypoxic respiratory failure, and admission 1 month ago for pneumonia, possibly postobstructive, now returning to the emergency department with increased shortness of breath, subjective fevers, fatigue, and dark stools.  Patient reports that he has had worsening shortness of breath over the past couple weeks, particularly severe today.  Cough has been mainly nonproductive.  He has had subjective fevers over the past couple days.  He has had constant chest discomfort for weeks which he attributes to his increased work of breathing.  Reports that stool has been darker for weeks, denies any red blood, denies vomiting, and denies any significant abdominal pain.  He is prescribed supplemental oxygen but does not use it regularly.  He was seen by cardiology and pulmonology during recent admission, was not felt to be a good candidate for bronchoscopy, had negative VQ scan, and was discharged home with continued antibiotics.  EMS found the patient be saturating in the 60s on room air, placed him on nonrebreather, and treated with IV Solu-Medrol and duo nebs prior to arrival in the ED.  ED Course: Upon arrival to the ED, patient is found to be afebrile, saturating mid 90s on BiPAP, tachypneic, and with stable blood pressure.  EKG features sinus rhythm with diffuse T wave inversions.  Chest x-ray with right midlung airspace disease concerning for pneumonia, stable small bilateral pleural effusions, cardiomegaly, and vascular congestion.  Chemistry panel with glucose 280, creatinine 2.93, and mild elevation in transaminases.  CBC with normocytic anemia, hemoglobin 6.7.  Troponin was 545 and BNP 2328.   Blood cultures were collected and the patient was treated with doxycycline, BiPAP, duo nebs, IV Protonix, and had 1 unit of RBC ordered for transfusion.  Review of Systems:  All other systems reviewed and apart from HPI, are negative.  Past Medical History:  Diagnosis Date   Altered mental status    a. 05/2017 - adm with blurred vision, somnolence in setting of AKI and high blood sugar.   Anemia    CKD (chronic kidney disease), stage III (HCC)    Diabetes mellitus    Diastolic CHF, chronic ([place])    A.  03/2009 Echo: EF 60-65%, Gr II diast dysfxn   Elevated troponin    a. 2013 - troponin 1.4. b. 2019 - troponin 0.32; neg nuc 07/2017.   Glaucoma    Hyperlipidemia    HYPERCHOLESTEROLEMIA   Hypertension    MARKED LEFT VENTRICULAR HYPERTROPHY BY PREVIOUS ECHOCARDIOGRAM--HE HAS HYPERDYNAMIC LEFT VENTRICULAR SYSTOLIC FUNCTION AND HAS IMPAIRED RELAXATION BY ECHO   Hypertrophic cardiomyopathy (HCC)    NSVT (nonsustained ventricular tachycardia) (HCC)    Premature atrial contractions    PVC's (premature ventricular contractions)    SVT (supraventricular tachycardia) ([place])     Past Surgical History:  Procedure Laterality Date   NO PAST SURGERIES      Social History:   reports that he has never smoked. He has never used smokeless tobacco. He reports that he does not drink alcohol and does not use drugs.  Allergies  Allergen Reactions   Aspirin Itching    Family History  Problem Relation Age of Onset   Hypertension Father    Stroke Father  Hypertension Mother    Diabetes Brother    Hypertension Brother    Diabetes Brother      Prior to Admission medications   Medication Sig Start Date End Date Taking? Authorizing Provider  amLODipine (NORVASC) 10 MG tablet Take 1 tablet (10 mg total) by mouth daily. 05/13/20   Shawna Clamp, Robert Chaney  aspirin EC 81 MG tablet Take 1 tablet (81 mg total) by mouth daily. Swallow whole. 03/02/20   Bhagat, Crista Luria, PA  atorvastatin (LIPITOR) 20 MG  tablet Take 1 tablet (20 mg total) by mouth daily. 03/16/18   Dorothy Spark, Robert Chaney  brimonidine (ALPHAGAN) 0.2 % ophthalmic solution Place 1 drop into the right eye 2 (two) times daily. 10/06/19   Provider, Historical, Robert Chaney  carvedilol (COREG) 25 MG tablet Take 2 tablets (50 mg total) by mouth 2 (two) times daily. 02/21/20 05/09/21  Dessa Phi, DO  cholecalciferol (VITAMIN D3) 25 MCG (1000 UT) tablet Take 1,000 Units by mouth daily.    Provider, Historical, Robert Chaney  cloNIDine (CATAPRES) 0.1 MG tablet Take 0.1-0.2 mg by mouth See admin instructions. 0.1 mg in the morning 0.2 mg at bedtime 01/22/21   Provider, Historical, Robert Chaney  Continuous Blood Gluc Sensor (DEXCOM G6 SENSOR) MISC 1 Device by Does not apply route as directed. 01/12/21   Shamleffer, Melanie Crazier, Robert Chaney  Continuous Blood Gluc Transmit (DEXCOM G6 TRANSMITTER) MISC 1 Device by Does not apply route as directed. 01/12/21   Shamleffer, Melanie Crazier, Robert Chaney  diclofenac Sodium (VOLTAREN) 1 % GEL Apply 4 g topically 4 (four) times daily as needed for pain. 02/07/20   Provider, Historical, Robert Chaney  dorzolamide (TRUSOPT) 2 % ophthalmic solution Place 1 drop into both eyes 2 (two) times daily. 12/04/20   Provider, Historical, Robert Chaney  furosemide (LASIX) 40 MG tablet Take 1 tablet (40 mg total) by mouth 2 (two) times daily. 05/13/20   Shawna Clamp, Robert Chaney  glucose blood (ONETOUCH VERIO) test strip USE 1 STRIP TO CHECK GLUCOSE THREE TIMES DAILY AS DIRECTED 07/24/20   Shamleffer, Melanie Crazier, Robert Chaney  hydrALAZINE (APRESOLINE) 100 MG tablet Take 1 tablet (100 mg total) by mouth 3 (three) times daily. 03/16/20   Skeet Latch, Robert Chaney  Insulin Glargine 300 UNIT/ML SOPN Inject 18 Units into the skin at bedtime.    Provider, Historical, Robert Chaney  isosorbide mononitrate (IMDUR) 60 MG 24 hr tablet Take 60 mg by mouth daily. 04/27/20   Provider, Historical, Robert Chaney  latanoprost (XALATAN) 0.005 % ophthalmic solution Place 1 drop into the left eye at bedtime. 10/06/19   Provider, Historical, Robert Chaney   nitroGLYCERIN (NITROSTAT) 0.4 MG SL tablet DISSOLVE ONE TABLET UNDER THE TONGUE EVERY 5 MINUTES AS NEEDED FOR CHEST PAIN. Patient taking differently: Place 0.4 mg under the tongue every 5 (five) minutes as needed for chest pain. 03/27/20   Dorothy Spark, Robert Chaney  NOVOLOG RELION 100 UNIT/ML injection Inject 6 Units into the skin with breakfast, with lunch, and with evening meal. 12/19/20   Provider, Historical, Robert Chaney    Physical Exam: Vitals:   02/28/21 2145 02/28/21 2200 02/28/21 2215 02/28/21 2245  BP: (!) 150/66 (!) 162/61 (!) 147/64 (!) 156/72  Pulse: (!) 54 (!) 53 (!) 55 (!) 54  Resp: '13 13 13 13  ' Temp:      TempSrc:      SpO2: 100% 99% 100% 100%  Weight:      Height:        Constitutional: NAD, calm  Eyes: PERTLA, lids and conjunctivae normal ENMT: Mucous membranes are moist. Posterior  pharynx clear of any exudate or lesions.   Neck: supple, no masses  Respiratory: no wheezing, no crackles. No accessory muscle use.  Cardiovascular: S1 & S2 heard, regular rate and rhythm. Elevated JVP.    Abdomen: No distension, soft, no rebound pain or guarding. Bowel sounds active.  Musculoskeletal: no clubbing / cyanosis. No joint deformity upper and lower extremities.   Skin: Crusted lower leg lesion. Warm, dry, well-perfused. Neurologic: CN 2-12 grossly intact. Sensation intact. Moving all extremities.  Psychiatric: Sleeping, wakes to voice. Oriented to person, place, and situation. Calm and cooperative.    Labs and Imaging on Admission: I have personally reviewed following labs and imaging studies  CBC: Recent Labs  Lab 02/28/21 2042 02/28/21 2051  WBC 4.4  --   NEUTROABS 2.7  --   HGB 6.7* 7.1*  HCT 22.7* 21.0*  MCV 95.0  --   PLT 189  --    Basic Metabolic Panel: Recent Labs  Lab 02/28/21 2042 02/28/21 2051  NA 137 142  K 4.6 4.7  CL 108  --   CO2 24  --   GLUCOSE 280*  --   BUN 41*  --   CREATININE 2.93*  --   CALCIUM 8.3*  --    GFR: Estimated Creatinine  Clearance: 22 mL/min (A) (by C-G formula based on SCr of 2.93 mg/dL (H)). Liver Function Tests: Recent Labs  Lab 02/28/21 2042  AST 74*  ALT 74*  ALKPHOS 58  BILITOT 0.7  PROT 6.0*  ALBUMIN 2.4*   No results for input(s): LIPASE, AMYLASE in the last 168 hours. No results for input(s): AMMONIA in the last 168 hours. Coagulation Profile: Recent Labs  Lab 02/28/21 2042  INR 1.2   Cardiac Enzymes: No results for input(s): CKTOTAL, CKMB, CKMBINDEX, TROPONINI in the last 168 hours. BNP (last 3 results) No results for input(s): PROBNP in the last 8760 hours. HbA1C: No results for input(s): HGBA1C in the last 72 hours. CBG: No results for input(s): GLUCAP in the last 168 hours. Lipid Profile: No results for input(s): CHOL, HDL, LDLCALC, TRIG, CHOLHDL, LDLDIRECT in the last 72 hours. Thyroid Function Tests: No results for input(s): TSH, T4TOTAL, FREET4, T3FREE, THYROIDAB in the last 72 hours. Anemia Panel: Recent Labs    02/28/21 2144  FERRITIN 23*  TIBC 339  IRON 19*   Urine analysis:    Component Value Date/Time   COLORURINE YELLOW 01/30/2021 0315   APPEARANCEUR CLEAR 01/30/2021 0315   LABSPEC 1.012 01/30/2021 0315   PHURINE 6.0 01/30/2021 0315   GLUCOSEU 50 (A) 01/30/2021 0315   HGBUR NEGATIVE 01/30/2021 0315   BILIRUBINUR NEGATIVE 01/30/2021 0315   KETONESUR NEGATIVE 01/30/2021 0315   PROTEINUR 100 (A) 01/30/2021 0315   UROBILINOGEN 1.0 07/19/2011 1335   NITRITE NEGATIVE 01/30/2021 0315   LEUKOCYTESUR TRACE (A) 01/30/2021 0315   Sepsis Labs: '@LABRCNTIP' (procalcitonin:4,lacticidven:4) ) Recent Results (from the past 240 hour(s))  Resp Panel by RT-PCR (Flu A&B, Covid) Nasopharyngeal Swab     Status: None   Collection Time: 02/28/21  8:43 PM   Specimen: Nasopharyngeal Swab; Nasopharyngeal(NP) swabs in vial transport medium  Result Value Ref Range Status   SARS Coronavirus 2 by RT PCR NEGATIVE NEGATIVE Final    Comment: (NOTE) SARS-CoV-2 target nucleic acids  are NOT DETECTED.  The SARS-CoV-2 RNA is generally detectable in upper respiratory specimens during the acute phase of infection. The lowest concentration of SARS-CoV-2 viral copies this assay can detect is 138 copies/mL. A negative result does not preclude  SARS-Cov-2 infection and should not be used as the sole basis for treatment or other patient management decisions. A negative result may occur with  improper specimen collection/handling, submission of specimen other than nasopharyngeal swab, presence of viral mutation(s) within the areas targeted by this assay, and inadequate number of viral copies(<138 copies/mL). A negative result must be combined with clinical observations, patient history, and epidemiological information. The expected result is Negative.  Fact Sheet for Patients:  EntrepreneurPulse.com.au  Fact Sheet for Healthcare Providers:  IncredibleEmployment.be  This test is no t yet approved or cleared by the [place] FDA and  has been authorized for detection and/or diagnosis of SARS-CoV-2 by FDA under an Emergency Use Authorization (EUA). This EUA will remain  in effect (meaning this test can be used) for the duration of the COVID-19 declaration under Section 564(b)(1) of the Act, 21 U.S.C.section 360bbb-3(b)(1), unless the authorization is terminated  or revoked sooner.       Influenza A by PCR NEGATIVE NEGATIVE Final   Influenza B by PCR NEGATIVE NEGATIVE Final    Comment: (NOTE) The Xpert Xpress SARS-CoV-2/FLU/RSV plus assay is intended as an aid in the diagnosis of influenza from Nasopharyngeal swab specimens and should not be used as a sole basis for treatment. Nasal washings and aspirates are unacceptable for Xpert Xpress SARS-CoV-2/FLU/RSV testing.  Fact Sheet for Patients: EntrepreneurPulse.com.au  Fact Sheet for Healthcare Providers: IncredibleEmployment.be  This test is  not yet approved or cleared by the [place] FDA and has been authorized for detection and/or diagnosis of SARS-CoV-2 by FDA under an Emergency Use Authorization (EUA). This EUA will remain in effect (meaning this test can be used) for the duration of the COVID-19 declaration under Section 564(b)(1) of the Act, 21 U.S.C. section 360bbb-3(b)(1), unless the authorization is terminated or revoked.  Performed at [facility], [place] [address] [place], [place] [address]      Radiological Exams on Admission: DG Chest 2 View  Result Date: 02/28/2021 CLINICAL DATA:  Suspected sepsis. EXAM: CHEST - 2 VIEW COMPARISON:  Chest x-ray 02/12/2021. CT chest 01/30/2021. FINDINGS: Heart is enlarged, unchanged. The aorta is tortuous and contains atherosclerotic calcifications. Right mid lung airspace disease is again noted similar to the prior examination. There are stable small bilateral pleural effusions. There are increased central interstitial markings bilaterally. There is no evidence for pneumothorax. No acute fractures are seen. Degenerative changes affect the spine. IMPRESSION: 1. Stable right mid lung airspace disease worrisome for pneumonia. 2. Stable small bilateral pleural effusions. 3. Cardiomegaly with central pulmonary vascular congestion. Electronically Signed   By: Ronney Asters M.D.   On: 02/28/2021 21:16    EKG: Independently reviewed. Sinus rhythm, RAD, T-wave inversions.   Assessment/Plan  1. Acute on chronic hypoxic respiratory failure; pneumonia (?postobstructive); acute on chronic diastolic CHF   - Patient with chronic hypoxic resp failure, HOCM, and HFpEF admitted last month with PNA (possibly postobstructive) returns with increased SOB and subjective fevers  - CXR w/ vascular congestion and persistent pneumonia, labs with increased BNP  - He was started on BiPAP and antibiotics in ED - Check blood and sputum cultures, strep pneumo and legionella antigens, and procalcitonin   - Treat with vancomycin and cefepime for now, diurese with IV Lasix, continue BiPAP overnight    2. Elevated troponin  - HS troponin is 545 in ED  - EKG with diffuse T-wave inversions similar to prior  - He reports weeks of chest discomfort that he attributes to increased WOB  -  Likely demand ischemia/type II event  - Treat underlying respiratory illness and anemia, trend troponin, continue statin and beta-blocker when off BiPAP, hold ASA pending FOBT   3. Anemia; dark stool  - Hgb is 6.7 on admission, down from 7.6 one month ago  - He reports dark stool for a few weeks, no abdominal pain or vomiting  - BUN is stable  - 1 unit RBC ordered for transfusion from ED  - Check anemia panel and FOBT, check post-transfusion H&H, keep Hgb at least 8 for now given concern for type II non-STEMI    4. Insulin-dependent DM  - A1c was 5.8% in July 2022  - Continue CBG checks and insulin    5. CKD IV  - SCr is 2.93 on admission, up from 2.67 a month ago  - Renally-dose medications, monitor while diuresing   6. Elevated transaminases  - Mild elevation in transaminases noted on admission, previously normal  - Alk phos and bilirubin are normal, abdominal exam benign  - Monitor     DVT prophylaxis: SCDs  Code Status: DNR, confirmed with patient  Level of Care: Level of care: Progressive Family Communication: Mickey Farber (relative) updated by phone, brother could not be reached   Disposition Plan:  Patient is from: Home  Anticipated d/c is to: TBD Anticipated d/c date is: 03/03/21 Patient currently: Pending repeat troponin, improvement in respiratory status, FOBT, stable H&H  Consults called: None  Admission status: Inpatient     Vianne Bulls, Robert Chaney [facility]  02/28/2021, 11:34 PM

## 2021-02-28 NOTE — ED Notes (Signed)
Dr. Pearline Cables made aware of pt critical result, troponin 545

## 2021-02-28 NOTE — ED Notes (Signed)
Patient transported to X-ray 

## 2021-02-28 NOTE — ED Notes (Signed)
Dr. Pearline Cables made aware of pt critical result, hemoglobin 6.7

## 2021-02-28 NOTE — ED Triage Notes (Addendum)
Pt bib EMS from home for SOB. 66% on room air, unable to speak full sentences. 100% on 15L non-rebreather mask. 125 Solu-medrol, 5 albuterol, 0.5 atutropium given by EMS for rhonchi. Pt chronically wears 4L nasal cannula.   18G IV in L forearm EMS: 138/90 RR 18-20 on non-rebreather

## 2021-02-28 NOTE — ED Provider Notes (Signed)
[facility] EMERGENCY DEPARTMENT Provider Note   CSN: [id number] Arrival date & time: 02/28/21  2031     History No chief complaint on file.   Robert Chaney is a 70 y.o. male.  70 year old male with history of diabetes, HFpEF, hyperlipidemia, hypertension, CKD, home o2 dependence 4L, presents to the ER secondary to acute respiratory distress.  Per EMS patient on arrival was hypoxic in the mid 60s.  4 L nasal cannula at home.  He was started on 15 L nonrebreather with mild improvement.  Given steroids, nebulized breathing treatment with mild improvement respiratory status.  Upon arrival patient is on 15 L nonrebreather.  Patient does report that he has been feeling increasingly short of breath over the past few days.  Intermittent chills, subjective fevers.  Chest tightness.  Nonproductive cough.  Reduced appetite.  Concerned that he may have pneumonia.  No nausea or vomiting.  No recent travel or sick contacts.  He has received COVID-19 vaccination with booster.  Patient denies intubation history.  He uses NIPPV at home intermittently.   The history is provided by the patient and the EMS personnel. No language interpreter was used.  Shortness of Breath Severity:  Severe Onset quality:  Gradual Duration:  2 days Progression:  Worsening Chronicity:  Chronic Ineffective treatments:  Rest and oxygen Associated symptoms: chest pain, cough and fever   Associated symptoms: no headaches, no rash and no vomiting       Past Medical History:  Diagnosis Date   Altered mental status    a. 05/2017 - adm with blurred vision, somnolence in setting of AKI and high blood sugar.   Anemia    CKD (chronic kidney disease), stage III (HCC)    Diabetes mellitus    Diastolic CHF, chronic ([place])    A.  03/2009 Echo: EF 60-65%, Gr II diast dysfxn   Elevated troponin    a. 2013 - troponin 1.4. b. 2019 - troponin 0.32; neg nuc 07/2017.   Glaucoma    Hyperlipidemia     HYPERCHOLESTEROLEMIA   Hypertension    MARKED LEFT VENTRICULAR HYPERTROPHY BY PREVIOUS ECHOCARDIOGRAM--HE HAS HYPERDYNAMIC LEFT VENTRICULAR SYSTOLIC FUNCTION AND HAS IMPAIRED RELAXATION BY ECHO   Hypertrophic cardiomyopathy (HCC)    NSVT (nonsustained ventricular tachycardia) (HCC)    Premature atrial contractions    PVC's (premature ventricular contractions)    SVT (supraventricular tachycardia) ([place])     Patient Active Problem List   Diagnosis Date Noted   Acute and chronic respiratory failure with hypoxia ([place]) 02/28/2021   Syncope 01/30/2021   Acute on chronic respiratory failure with hypoxemia ([place]) 01/30/2021   Multifocal pneumonia 01/30/2021   Acute on chronic respiratory failure with hypoxia ([place]) 04/24/2020   Hypertensive urgency 02/18/2020   CHF exacerbation ([place]) 01/11/2020   Acute respiratory failure ([place]) 01/11/2020   Type 2 diabetes mellitus with retinopathy, with long-term current use of insulin ([place]) 07/16/2019   Type 2 diabetes mellitus with stage 3a chronic kidney disease, with long-term current use of insulin ([place]) 07/16/2019   Type 2 diabetes mellitus with diabetic polyneuropathy, with long-term current use of insulin ([place]) 07/16/2019   OSA (obstructive sleep apnea) 03/30/2019   Hyperglycemia 07/31/2017   Near syncope 07/31/2017   Acute-on-chronic kidney injury ([place]) 07/31/2017   AKI (acute kidney injury) ([place]) 06/09/2017   Hypertensive heart disease 11/07/2016   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[place]) 07/26/2015   Dyslipidemia 05/19/2013   Diabetic hyperosmolar non-ketotic state ([place]) 07/19/2011   DM (diabetes mellitus) with  complications ([place]) 18/84/1660   Stage 4 chronic kidney disease ([place]) 07/19/2011   Elevated troponin 07/19/2011   Prolonged QT interval 07/19/2011   Hyperkalemia 07/19/2011   Volume depletion 07/19/2011   Acute on chronic diastolic CHF (congestive heart failure) ([place]) 07/19/2011   Essential hypertension 07/19/2011    Elevated CPK 07/19/2011   Hypercholesterolemia 02/20/2011   Benign hypertensive heart disease without heart failure 02/20/2011    Past Surgical History:  Procedure Laterality Date   NO PAST SURGERIES         Family History  Problem Relation Age of Onset   Hypertension Father    Stroke Father    Hypertension Mother    Diabetes Brother    Hypertension Brother    Diabetes Brother     Social History   Tobacco Use   Smoking status: Never   Smokeless tobacco: Never  Vaping Use   Vaping Use: Never used  Substance Use Topics   Alcohol use: No   Drug use: No    Home Medications Prior to Admission medications   Medication Sig Start Date End Date Taking? Authorizing Provider  amLODipine (NORVASC) 10 MG tablet Take 1 tablet (10 mg total) by mouth daily. 05/13/20   Shawna Clamp, MD  aspirin EC 81 MG tablet Take 1 tablet (81 mg total) by mouth daily. Swallow whole. 03/02/20   Bhagat, Crista Luria, PA  atorvastatin (LIPITOR) 20 MG tablet Take 1 tablet (20 mg total) by mouth daily. 03/16/18   Dorothy Spark, MD  brimonidine (ALPHAGAN) 0.2 % ophthalmic solution Place 1 drop into the right eye 2 (two) times daily. 10/06/19   [provider]  carvedilol (COREG) 25 MG tablet Take 2 tablets (50 mg total) by mouth 2 (two) times daily. 02/21/20 05/09/21  Dessa Phi, DO  cholecalciferol (VITAMIN D3) 25 MCG (1000 UT) tablet Take 1,000 Units by mouth daily.    [provider]  cloNIDine (CATAPRES) 0.1 MG tablet Take 0.1-0.2 mg by mouth See admin instructions. 0.1 mg in the morning 0.2 mg at bedtime 01/22/21   [provider]  Continuous Blood Gluc Sensor (DEXCOM G6 SENSOR) MISC 1 Device by Does not apply route as directed. 01/12/21   Shamleffer, Melanie Crazier, MD  Continuous Blood Gluc Transmit (DEXCOM G6 TRANSMITTER) MISC 1 Device by Does not apply route as directed. 01/12/21   Shamleffer, Melanie Crazier, MD  diclofenac Sodium (VOLTAREN) 1 % GEL Apply 4 g topically  4 (four) times daily as needed for pain. 02/07/20   [provider]  dorzolamide (TRUSOPT) 2 % ophthalmic solution Place 1 drop into both eyes 2 (two) times daily. 12/04/20   [provider]  furosemide (LASIX) 40 MG tablet Take 1 tablet (40 mg total) by mouth 2 (two) times daily. 05/13/20   Shawna Clamp, MD  glucose blood (ONETOUCH VERIO) test strip USE 1 STRIP TO CHECK GLUCOSE THREE TIMES DAILY AS DIRECTED 07/24/20   Shamleffer, Melanie Crazier, MD  hydrALAZINE (APRESOLINE) 100 MG tablet Take 1 tablet (100 mg total) by mouth 3 (three) times daily. 03/16/20   Skeet Latch, MD  Insulin Glargine 300 UNIT/ML SOPN Inject 18 Units into the skin at bedtime.    [provider]  isosorbide mononitrate (IMDUR) 60 MG 24 hr tablet Take 60 mg by mouth daily. 04/27/20   [provider]  latanoprost (XALATAN) 0.005 % ophthalmic solution Place 1 drop into the left eye at bedtime. 10/06/19   [provider]  nitroGLYCERIN (NITROSTAT) 0.4 MG SL tablet DISSOLVE ONE  TABLET UNDER THE TONGUE EVERY 5 MINUTES AS NEEDED FOR CHEST PAIN. Patient taking differently: Place 0.4 mg under the tongue every 5 (five) minutes as needed for chest pain. 03/27/20   Dorothy Spark, MD  NOVOLOG RELION 100 UNIT/ML injection Inject 6 Units into the skin with breakfast, with lunch, and with evening meal. 12/19/20   [provider]    Allergies    Aspirin  Review of Systems   Review of Systems  Constitutional:  Positive for appetite change and fever. Negative for chills.  HENT:  Negative for facial swelling and trouble swallowing.   Eyes:  Negative for photophobia and visual disturbance.  Respiratory:  Positive for cough, chest tightness and shortness of breath.   Cardiovascular:  Positive for chest pain. Negative for palpitations.  Gastrointestinal:  Positive for constipation. Negative for nausea and vomiting.  Endocrine: Negative for polydipsia and polyuria.  Genitourinary:   Negative for difficulty urinating and hematuria.  Musculoskeletal:  Negative for gait problem and joint swelling.  Skin:  Negative for pallor and rash.  Neurological:  Negative for syncope and headaches.  Psychiatric/Behavioral:  Negative for agitation and confusion.    Physical Exam Updated Vital Signs BP (!) 156/72   Pulse (!) 54   Temp 98.1 F (36.7 C) (Oral)   Resp 13   Ht 5\' 8"  (1.727 m)   Wt 77.1 kg   SpO2 100%   BMI 25.85 kg/m   Physical Exam Vitals and nursing note reviewed.  Constitutional:      General: He is in acute distress.     Appearance: He is well-developed. He is ill-appearing.  HENT:     Head: Normocephalic and atraumatic.     Right Ear: External ear normal.     Left Ear: External ear normal.     Mouth/Throat:     Mouth: Mucous membranes are moist.  Eyes:     General: No scleral icterus. Cardiovascular:     Rate and Rhythm: Normal rate and regular rhythm.     Pulses: Normal pulses.     Heart sounds: Normal heart sounds.  Pulmonary:     Effort: Tachypnea and respiratory distress present.     Breath sounds: Decreased air movement present. Wheezing present.     Comments: Adventitious breath sounds, right greater than left Abdominal:     General: Abdomen is flat.     Palpations: Abdomen is soft.     Tenderness: There is no abdominal tenderness.  Musculoskeletal:        General: Normal range of motion.     Cervical back: Normal range of motion.     Right lower leg: No edema.     Left lower leg: No edema.  Skin:    General: Skin is warm and dry.     Capillary Refill: Capillary refill takes less than 2 seconds.  Neurological:     Mental Status: He is alert and oriented to person, place, and time.  Psychiatric:        Mood and Affect: Mood normal.        Behavior: Behavior normal.    ED Results / Procedures / Treatments   Labs (all labs ordered are listed, but only abnormal results are displayed) Labs Reviewed  COMPREHENSIVE METABOLIC PANEL -  Abnormal; Notable for the following components:      Result Value   Glucose, Bld 280 (*)    BUN 41 (*)    Creatinine, Ser 2.93 (*)    Calcium 8.3 (*)  Total Protein 6.0 (*)    Albumin 2.4 (*)    AST 74 (*)    ALT 74 (*)    GFR, Estimated 22 (*)    All other components within normal limits  CBC WITH DIFFERENTIAL/PLATELET - Abnormal; Notable for the following components:   RBC 2.39 (*)    Hemoglobin 6.7 (*)    HCT 22.7 (*)    MCHC 29.5 (*)    RDW 16.4 (*)    All other components within normal limits  BRAIN NATRIURETIC PEPTIDE - Abnormal; Notable for the following components:   B Natriuretic Peptide 2,327.5 (*)    All other components within normal limits  IRON AND TIBC - Abnormal; Notable for the following components:   Iron 19 (*)    Saturation Ratios 6 (*)    All other components within normal limits  FERRITIN - Abnormal; Notable for the following components:   Ferritin 23 (*)    All other components within normal limits  I-STAT VENOUS BLOOD GAS, ED - Abnormal; Notable for the following components:   pH, Ven 7.435 (*)    pCO2, Ven 38.2 (*)    pO2, Ven 149.0 (*)    HCT 21.0 (*)    Hemoglobin 7.1 (*)    All other components within normal limits  TROPONIN I (HIGH SENSITIVITY) - Abnormal; Notable for the following components:   Troponin I (High Sensitivity) 545 (*)    All other components within normal limits  RESP PANEL BY RT-PCR (FLU A&B, COVID) ARPGX2  CULTURE, BLOOD (ROUTINE X 2)  CULTURE, BLOOD (ROUTINE X 2)  SARS CORONAVIRUS 2 (TAT 6-24 HRS)  LACTIC ACID, PLASMA  PROTIME-INR  LACTATE DEHYDROGENASE  LACTIC ACID, PLASMA  URINALYSIS, ROUTINE W REFLEX MICROSCOPIC  OCCULT BLOOD X 1 CARD TO LAB, STOOL  TYPE AND SCREEN  ABO/RH  PREPARE RBC (CROSSMATCH)  TROPONIN I (HIGH SENSITIVITY)    EKG EKG Interpretation  Date/Time:  Wednesday February 28 2021 20:38:43 EDT Ventricular Rate:  60 PR Interval:  155 QRS Duration: 81 QT Interval:  407 QTC  Calculation: 407 R Axis:   96 Text Interpretation: Sinus rhythm Right axis deviation  Diffuse TWI not present on previous ECG Confirmed by Wynona Dove ([id number]) on 02/28/2021 8:52:54 PM  Radiology DG Chest 2 View  Result Date: 02/28/2021 CLINICAL DATA:  Suspected sepsis. EXAM: CHEST - 2 VIEW COMPARISON:  Chest x-ray 02/12/2021. CT chest 01/30/2021. FINDINGS: Heart is enlarged, unchanged. The aorta is tortuous and contains atherosclerotic calcifications. Right mid lung airspace disease is again noted similar to the prior examination. There are stable small bilateral pleural effusions. There are increased central interstitial markings bilaterally. There is no evidence for pneumothorax. No acute fractures are seen. Degenerative changes affect the spine. IMPRESSION: 1. Stable right mid lung airspace disease worrisome for pneumonia. 2. Stable small bilateral pleural effusions. 3. Cardiomegaly with central pulmonary vascular congestion. Electronically Signed   By: Ronney Asters M.D.   On: 02/28/2021 21:16    Procedures .Critical Care  Date/Time: 02/28/2021 8:49 PM Performed by: Jeanell Sparrow, DO Authorized by: Jeanell Sparrow, DO   Critical care provider statement:    Critical care time (minutes):  45   Critical care time was exclusive of:  Separately billable procedures and treating other patients   Critical care was necessary to treat or prevent imminent or life-threatening deterioration of the following conditions:  Respiratory failure, sepsis and cardiac failure   Critical care was time spent personally by me on the following  activities:  Discussions with consultants, evaluation of patient's response to treatment, examination of patient, ordering and performing treatments and interventions, ordering and review of laboratory studies, ordering and review of radiographic studies, pulse oximetry, re-evaluation of patient's condition, obtaining history from patient or surrogate and review of old charts   Care  discussed with: admitting provider     Medications Ordered in ED Medications  doxycycline (VIBRAMYCIN) 100 mg in sodium chloride 0.9 % 250 mL IVPB (100 mg Intravenous New Bag/Given 02/28/21 2244)  ipratropium-albuterol (DUONEB) 0.5-2.5 (3) MG/3ML nebulizer solution 3 mL (3 mLs Nebulization Given 02/28/21 2048)  pantoprazole (PROTONIX) injection 40 mg (40 mg Intravenous Given 02/28/21 2244)  0.9 %  sodium chloride infusion (10 mL/hr Intravenous New Bag/Given 02/28/21 2240)    ED Course  I have reviewed the triage vital signs and the nursing notes.  Pertinent labs & imaging results that were available during my care of the patient were reviewed by me and considered in my medical decision making (see chart for details).    MDM Rules/Calculators/A&P                         This patient complains of dyspnea; this involves an extensive number of treatment options and is a complaint that carries with it a high risk of complications and Morbidity. Serious etiologies considered.   Initial presentation patient is ill-appearing, tachypneic, hypoxic prior to arrival.  On nonrebreather with nebulized breathing treatment currently running.  He is able to speak with mild conversational dyspnea.  Not currently hypoxic.  Will transition to BiPAP.  Patient with wheezing bilaterally.  He was already given IV steroids by EMS.  Given 1 nebulized breathing treatment EMS.  We will repeat nebulized breathing treatment as his wheezing continues.  Images reviewed, chest x-ray Is concerning for pneumonia.  Given patient's risk for decompensation this is likely component of patient's respiratory distress today.  D/w pharmacy. Antibiotics initiated. Sepsis order set.   Patient with anemia.  Reported black stool over the past couple weeks.  Hemoglobin today is 6.7.  Hemoglobin previously 7.6. He is agreeable to blood products today, he has verbalized his agreement. Will also give protonix. Will defer hemoccult at this time as  patient is on BIPAP.   Creatinine also mildly worse than his baseline.  Has underlying CKD.  Potassium is 4.7.  Patient with BNP over 2000.  History of diastolic CHF.  Judicious IV fluids.  Trop elevated over 500.  EKG with diffuse T wave inversion, concerning for possible ischemia.  No STEMI.  Recommend trending troponin.  Will not give ASA or heparin at this time given anemia.   Pt with likely community-acquired pneumonia/sepsis, acute on chronic CHF exacerbation. NSTEMI, symptomatic anemia.   10:20 PM Respiratory status has improved, continue on BIPAP for now. Pt updated on findings and plan. All questions answered.   At this time plan for admission, patient is agreeable. D/w admitted team who accepts patient. He is stable for admission at this time   Final Clinical Impression(s) / ED Diagnoses Final diagnoses:  Acute respiratory failure with hypoxia ([place])  NSTEMI (non-ST elevated myocardial infarction) ([place])  Community acquired pneumonia, unspecified laterality  Anemia, unspecified type  Acute on chronic congestive heart failure, unspecified heart failure type [facility])    Rx / DC Orders ED Discharge Orders     None        Jeanell Sparrow, DO 02/28/21 2301
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
## 2021-02-28 NOTE — Progress Notes (Signed)
Pharmacy Antibiotic Note  Robert Chaney is a 70 y.o. male admitted on 02/28/2021 with  hypoxia .  Pharmacy has been consulted for vancomycin dosing for possible pneumonia.  Patient currently afebrile, white count normal at 4.4, scr elevated at 2.9. Given fluctuating renal function will use standard dosing nomogram as opposed to AUC dosing.   Plan: Vancomycin 1500mg  now then 750mg  IV every 24 hours.  Goal trough 15-20 mcg/mL. Cefepime 2g q24 hours  Height: 5\' 8"  (172.7 cm) Weight: 77.1 kg (170 lb) IBW/kg (Calculated) : 68.4  Temp (24hrs), Avg:98.1 F (36.7 C), Min:98.1 F (36.7 C), Max:98.1 F (36.7 C)  Recent Labs  Lab 02/28/21 2042  WBC 4.4  CREATININE 2.93*  LATICACIDVEN 1.1    Estimated Creatinine Clearance: 22 mL/min (A) (by C-G formula based on SCr of 2.93 mg/dL (H)).    Allergies  Allergen Reactions   Aspirin Itching    Thank you for allowing pharmacy to be a part of this patient's care.  Erin Hearing PharmD., BCPS Clinical Pharmacist 02/28/2021 11:46 PM

## 2021-03-01 ENCOUNTER — Other Ambulatory Visit: Payer: Self-pay

## 2021-03-01 DIAGNOSIS — N179 Acute kidney failure, unspecified: Secondary | ICD-10-CM | POA: Diagnosis not present

## 2021-03-01 DIAGNOSIS — J9621 Acute and chronic respiratory failure with hypoxia: Secondary | ICD-10-CM

## 2021-03-01 DIAGNOSIS — R778 Other specified abnormalities of plasma proteins: Secondary | ICD-10-CM | POA: Diagnosis not present

## 2021-03-01 DIAGNOSIS — D649 Anemia, unspecified: Secondary | ICD-10-CM | POA: Diagnosis not present

## 2021-03-01 DIAGNOSIS — R9389 Abnormal findings on diagnostic imaging of other specified body structures: Secondary | ICD-10-CM | POA: Diagnosis not present

## 2021-03-01 DIAGNOSIS — I5033 Acute on chronic diastolic (congestive) heart failure: Secondary | ICD-10-CM | POA: Diagnosis not present

## 2021-03-01 LAB — URINALYSIS, ROUTINE W REFLEX MICROSCOPIC
Bacteria, UA: NONE SEEN
Bilirubin Urine: NEGATIVE
Glucose, UA: NEGATIVE mg/dL
Hgb urine dipstick: NEGATIVE
Ketones, ur: NEGATIVE mg/dL
Nitrite: NEGATIVE
Protein, ur: 30 mg/dL — AB
Specific Gravity, Urine: 1.011 (ref 1.005–1.030)
pH: 5 (ref 5.0–8.0)

## 2021-03-01 LAB — HEMOGLOBIN AND HEMATOCRIT, BLOOD
HCT: 31.9 % — ABNORMAL LOW (ref 39.0–52.0)
Hemoglobin: 9.6 g/dL — ABNORMAL LOW (ref 13.0–17.0)

## 2021-03-01 LAB — CBG MONITORING, ED
Glucose-Capillary: 164 mg/dL — ABNORMAL HIGH (ref 70–99)
Glucose-Capillary: 173 mg/dL — ABNORMAL HIGH (ref 70–99)
Glucose-Capillary: 178 mg/dL — ABNORMAL HIGH (ref 70–99)
Glucose-Capillary: 208 mg/dL — ABNORMAL HIGH (ref 70–99)
Glucose-Capillary: 267 mg/dL — ABNORMAL HIGH (ref 70–99)

## 2021-03-01 LAB — CBC
HCT: 31.9 % — ABNORMAL LOW (ref 39.0–52.0)
Hemoglobin: 9.3 g/dL — ABNORMAL LOW (ref 13.0–17.0)
MCH: 27.8 pg (ref 26.0–34.0)
MCHC: 29.2 g/dL — ABNORMAL LOW (ref 30.0–36.0)
MCV: 95.2 fL (ref 80.0–100.0)
Platelets: 225 10*3/uL (ref 150–400)
RBC: 3.35 MIL/uL — ABNORMAL LOW (ref 4.22–5.81)
RDW: 16.1 % — ABNORMAL HIGH (ref 11.5–15.5)
WBC: 4.3 10*3/uL (ref 4.0–10.5)
nRBC: 0.5 % — ABNORMAL HIGH (ref 0.0–0.2)

## 2021-03-01 LAB — STREP PNEUMONIAE URINARY ANTIGEN: Strep Pneumo Urinary Antigen: NEGATIVE

## 2021-03-01 LAB — GLUCOSE, CAPILLARY: Glucose-Capillary: 139 mg/dL — ABNORMAL HIGH (ref 70–99)

## 2021-03-01 LAB — PROCALCITONIN: Procalcitonin: 0.24 ng/mL

## 2021-03-01 LAB — MRSA NEXT GEN BY PCR, NASAL: MRSA by PCR Next Gen: NOT DETECTED

## 2021-03-01 LAB — ABO/RH: ABO/RH(D): O POS

## 2021-03-01 LAB — PREPARE RBC (CROSSMATCH)

## 2021-03-01 MED ORDER — CLONIDINE HCL 0.1 MG PO TABS
0.2000 mg | ORAL_TABLET | Freq: Every day | ORAL | Status: DC
  Administered 2021-03-01 – 2021-03-02 (×2): 0.2 mg via ORAL
  Filled 2021-03-01 (×2): qty 2

## 2021-03-01 MED ORDER — CLONIDINE HCL 0.1 MG PO TABS: 0.1000 mg | ORAL_TABLET | ORAL | Status: DC

## 2021-03-01 MED ORDER — ATORVASTATIN CALCIUM 10 MG PO TABS
20.0000 mg | ORAL_TABLET | Freq: Every day | ORAL | Status: DC
  Administered 2021-03-01 – 2021-03-10 (×10): 20 mg via ORAL
  Filled 2021-03-01 (×9): qty 2

## 2021-03-01 MED ORDER — CLONIDINE HCL 0.1 MG PO TABS
0.1000 mg | ORAL_TABLET | Freq: Every morning | ORAL | Status: DC
  Administered 2021-03-01 – 2021-03-02 (×2): 0.1 mg via ORAL
  Filled 2021-03-01 (×2): qty 1

## 2021-03-01 MED ORDER — INSULIN ASPART 100 UNIT/ML IJ SOLN
4.0000 [IU] | Freq: Three times a day (TID) | INTRAMUSCULAR | Status: DC
  Administered 2021-03-01 – 2021-03-02 (×5): 4 [IU] via SUBCUTANEOUS

## 2021-03-01 MED ORDER — INSULIN GLARGINE 300 UNIT/ML ~~LOC~~ SOPN: 18.0000 [IU] | PEN_INJECTOR | Freq: Every day | SUBCUTANEOUS | Status: DC

## 2021-03-01 MED ORDER — AMLODIPINE BESYLATE 10 MG PO TABS
10.0000 mg | ORAL_TABLET | Freq: Every day | ORAL | Status: DC
  Administered 2021-03-01 – 2021-03-06 (×6): 10 mg via ORAL
  Filled 2021-03-01: qty 2
  Filled 2021-03-01 (×5): qty 1

## 2021-03-01 MED ORDER — INSULIN ASPART 100 UNIT/ML IJ SOLN
0.0000 [IU] | Freq: Three times a day (TID) | INTRAMUSCULAR | Status: DC
  Administered 2021-03-01 (×2): 2 [IU] via SUBCUTANEOUS
  Administered 2021-03-02 (×2): 1 [IU] via SUBCUTANEOUS
  Administered 2021-03-03: 3 [IU] via SUBCUTANEOUS
  Administered 2021-03-04: 2 [IU] via SUBCUTANEOUS
  Administered 2021-03-06 (×2): 3 [IU] via SUBCUTANEOUS
  Administered 2021-03-06: 5 [IU] via SUBCUTANEOUS
  Administered 2021-03-07: 1 [IU] via SUBCUTANEOUS
  Administered 2021-03-07 – 2021-03-08 (×2): 2 [IU] via SUBCUTANEOUS
  Administered 2021-03-08 – 2021-03-09 (×2): 1 [IU] via SUBCUTANEOUS

## 2021-03-01 MED ORDER — CARVEDILOL 12.5 MG PO TABS
12.5000 mg | ORAL_TABLET | Freq: Two times a day (BID) | ORAL | Status: DC
  Administered 2021-03-01 – 2021-03-06 (×11): 12.5 mg via ORAL
  Filled 2021-03-01 (×11): qty 1

## 2021-03-01 MED ORDER — PNEUMOCOCCAL VAC POLYVALENT 25 MCG/0.5ML IJ INJ
0.5000 mL | INJECTION | INTRAMUSCULAR | Status: DC
Start: 2021-03-02 — End: 2021-03-10
  Filled 2021-03-01: qty 0.5

## 2021-03-01 MED ORDER — INSULIN GLARGINE-YFGN 100 UNIT/ML ~~LOC~~ SOLN
18.0000 [IU] | Freq: Every day | SUBCUTANEOUS | Status: DC
  Administered 2021-03-01 – 2021-03-02 (×2): 18 [IU] via SUBCUTANEOUS
  Filled 2021-03-01 (×3): qty 0.18
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
## 2021-03-01 NOTE — Plan of Care (Signed)

## 2021-03-01 NOTE — Progress Notes (Signed)
PROGRESS NOTE        PATIENT DETAILS Name: Robert Chaney Age: 70 y.o. Sex: male Date of Birth: [birth year] Admit Date: 02/28/2021 Admitting Physician Vianne Bulls, MD XNA:[id number], Christean Grief, MD  Brief Narrative: Patient is a 70 y.o. male with history of CKD stage IV, HOCM, HFpEF, IDDM, HTN, OSA on CPAP, chronic hypoxic respiratory failure on 3-4 L of oxygen at home-presented with worsening shortness of breath, chills, dark stools and fatigue.  He was found to have acute on chronic hypoxic respiratory failure due to a combination of right middle lobe infiltrate/HFpEF with exacerbation and worsening of his baseline hemoglobin.  He was subsequently admitted to the hospitalist service.  See below for further details.  Significant events: 9/7>> admit for shortness of breath/fever/black stools-worsening hypoxemia-imaging with persistent right middle lobe infiltrate-started on antibiotics/Lasix and PRB transfusion.    Significant studies: 8/9>> Echo: EF 70-75% 9/7>> CXR: Right midlung airspace disease worrisome for pneumonia.  Antimicrobial therapy: None  Microbiology data: 9/7>> COVID/influenza PCR: Negative 9/7>> blood culture: No growth  Procedures : None  Consults: None  DVT Prophylaxis : SCDs Start: 02/28/21 2322   Subjective: On BiPAP earlier this morning-feels much better.  No black stools overnight.   Assessment/Plan: Acute on chronic hypoxic respiratory failure: Secondary to HFpEF exacerbation and PNA.  Continue Lasix/antibiotics-attempt to liberate off BiPAP-he is chronically on 3-4 L of oxygen at home.  Persistent right middle lobe infiltrate: Check CT chest-obtain procalcitonin levels-continue antibiotics for now- will likely need PCCM evaluation.  HFpEF: Improved with diuretics-need to be cautious given underlying history of HOCM and preload reduction.  Elevated troponins: Probably related demand ischemia in the setting of anemia/respiratory  failure.  Will get cardiology opinion.  Due to concern for GI bleeding/severity of anemia-hold off on antiplatelets for now.  HOCM: Watch closely-minimize preload reduction is much as possible.  Resume beta-blocker.  ?  Upper GI bleed: Apparently had dark stools prior to this hospitalization-no overt bleeding apparent-did not have a BM overnight.  Currently on BiPAP-and unable to tolerate any endoscopy procedure.  Will maintain on PPI-transfuse as needed-and once he is more clinically improved-May need GI eval.  Anemia: Multifactorial-has anemia related to CKD at baseline-question of GI bleeding and acute blood loss as well.  Hemoglobin better after 1 unit of PRBC transfusion.  Follow closely  HTN: Uncontrolled-resume amlodipine/Coreg/clonidine-reassess on 9/9.  All antihypertensives were unable to be given due to patient being on BiPAP last night.  DM-2 (A1c 5.8 on 7/22): Resume 18 units of Lantus nightly-add [place] of NovoLog with meals-continue SSI and follow.  Reassess on 9/9.  Recent Labs    03/01/21 0016 03/01/21 0458 03/01/21 0750  GLUCAP 267* 208* 178*     CKD stage IV: Creatinine close to baseline-avoid nephrotoxic agents-follow.  History of renal artery stenosis  OSA: On CPAP nightly   Diet: Diet Order             Diet heart healthy/carb modified Room service appropriate? Yes; Fluid consistency: Thin; Fluid restriction: 1500 mL Fluid  Diet effective now                    Code Status: Full code   Family Communication: None at bedside  Disposition Plan: Status is: Inpatient  Remains inpatient appropriate because:Inpatient level of care appropriate due to severity of illness  Dispo: The patient  is from: Home              Anticipated d/c is to: Home              Patient currently is not medically stable to d/c.   Difficult to place patient No    Barriers to Discharge: On BiPAP-on IV antibiotics/IV Lasix-not yet stable for discharge.  Antimicrobial  agents: Anti-infectives (From admission, onward)    Start     Dose/Rate Route Frequency Ordered Stop   03/01/21 2300  vancomycin (VANCOREADY) IVPB 750 mg/150 mL        750 mg 150 mL/hr over 60 Minutes Intravenous Every 24 hours 02/28/21 2347     03/01/21 0000  vancomycin (VANCOREADY) IVPB 1500 mg/300 mL        1,500 mg 150 mL/hr over 120 Minutes Intravenous  Once 02/28/21 2347 03/01/21 0215   02/28/21 2345  ceFEPIme (MAXIPIME) 2 g in sodium chloride 0.9 % 100 mL IVPB       Note to Pharmacy: Cefepime 2 g IV q24h for CrCl < 30 mL/min   2 g 200 mL/hr over 30 Minutes Intravenous Daily at bedtime 02/28/21 2326 03/05/21 2159   02/28/21 2230  doxycycline (VIBRAMYCIN) 100 mg in sodium chloride 0.9 % 250 mL IVPB        100 mg 125 mL/hr over 120 Minutes Intravenous  Once 02/28/21 2219 03/01/21 0050        Time spent:35 minutes-Greater than 50% of this time was spent in counseling, explanation of diagnosis, planning of further management, and coordination of care.  MEDICATIONS: Scheduled Meds:  furosemide  40 mg Intravenous BID   insulin aspart  0-9 Units Subcutaneous Q4H   pantoprazole (PROTONIX) IV  40 mg Intravenous Q12H   Continuous Infusions:  ceFEPime (MAXIPIME) IV Stopped (03/01/21 0035)   vancomycin     PRN Meds:.acetaminophen **OR** acetaminophen   PHYSICAL EXAM: Vital signs: Vitals:   03/01/21 0926 03/01/21 1000 03/01/21 1030 03/01/21 1123  BP:  (!) 201/97 (!) 197/85 (!) 210/87  Pulse:  70 66 68  Resp:    16  Temp:      TempSrc:      SpO2: 96% 95% 95% 96%  Weight:      Height:       Filed Weights   02/28/21 2045  Weight: 77.1 kg   Body mass index is 25.85 kg/m.   Gen Exam: Lying comfortably in bed-BiPAP in place. HEENT:atraumatic, normocephalic Chest: Bibasilar rales. CVS:S1S2 regular Abdomen:soft non tender, non distended Extremities:trace edema Neurology: Non focal Skin: no rash  I have personally reviewed following labs and imaging  studies  LABORATORY DATA: CBC: Recent Labs  Lab 02/28/21 2042 02/28/21 2051 03/01/21 0845  WBC 4.4  --  4.3  NEUTROABS 2.7  --   --   HGB 6.7* 7.1* 9.3*  HCT 22.7* 21.0* 31.9*  MCV 95.0  --  95.2  PLT 189  --  517    Basic Metabolic Panel: Recent Labs  Lab 02/28/21 2042 02/28/21 2051  NA 137 142  K 4.6 4.7  CL 108  --   CO2 24  --   GLUCOSE 280*  --   BUN 41*  --   CREATININE 2.93*  --   CALCIUM 8.3*  --     GFR: Estimated Creatinine Clearance: 22 mL/min (A) (by C-G formula based on SCr of 2.93 mg/dL (H)).  Liver Function Tests: Recent Labs  Lab 02/28/21 2042  AST 74*  ALT 74*  ALKPHOS 58  BILITOT 0.7  PROT 6.0*  ALBUMIN 2.4*   No results for input(s): LIPASE, AMYLASE in the last 168 hours. No results for input(s): AMMONIA in the last 168 hours.  Coagulation Profile: Recent Labs  Lab 02/28/21 2042  INR 1.2    Cardiac Enzymes: No results for input(s): CKTOTAL, CKMB, CKMBINDEX, TROPONINI in the last 168 hours.  BNP (last 3 results) No results for input(s): PROBNP in the last 8760 hours.  Lipid Profile: No results for input(s): CHOL, HDL, LDLCALC, TRIG, CHOLHDL, LDLDIRECT in the last 72 hours.  Thyroid Function Tests: No results for input(s): TSH, T4TOTAL, FREET4, T3FREE, THYROIDAB in the last 72 hours.  Anemia Panel: Recent Labs    02/28/21 2144  FERRITIN 23*  TIBC 339  IRON 19*    Urine analysis:    Component Value Date/Time   COLORURINE YELLOW 03/01/2021 0029   APPEARANCEUR CLEAR 03/01/2021 0029   LABSPEC 1.011 03/01/2021 0029   PHURINE 5.0 03/01/2021 0029   GLUCOSEU NEGATIVE 03/01/2021 0029   HGBUR NEGATIVE 03/01/2021 0029   BILIRUBINUR NEGATIVE 03/01/2021 0029   KETONESUR NEGATIVE 03/01/2021 0029   PROTEINUR 30 (A) 03/01/2021 0029   UROBILINOGEN 1.0 07/19/2011 1335   NITRITE NEGATIVE 03/01/2021 0029   LEUKOCYTESUR MODERATE (A) 03/01/2021 0029    Sepsis Labs: Lactic Acid, Venous    Component Value Date/Time    LATICACIDVEN 1.1 02/28/2021 2042    MICROBIOLOGY: Recent Results (from the past 240 hour(s))  Culture, blood (Routine x 2)     Status: None (Preliminary result)   Collection Time: 02/28/21  8:42 PM   Specimen: BLOOD  Result Value Ref Range Status   Specimen Description BLOOD RIGHT ANTECUBITAL  Final   Special Requests   Final    BOTTLES DRAWN AEROBIC AND ANAEROBIC Blood Culture adequate volume   Culture   Final    NO GROWTH < 12 HOURS Performed at [facility], [place] [address] [place], [place] [address]    Report Status PENDING  Incomplete  Resp Panel by RT-PCR (Flu A&B, Covid) Nasopharyngeal Swab     Status: None   Collection Time: 02/28/21  8:43 PM   Specimen: Nasopharyngeal Swab; Nasopharyngeal(NP) swabs in vial transport medium  Result Value Ref Range Status   SARS Coronavirus 2 by RT PCR NEGATIVE NEGATIVE Final    Comment: (NOTE) SARS-CoV-2 target nucleic acids are NOT DETECTED.  The SARS-CoV-2 RNA is generally detectable in upper respiratory specimens during the acute phase of infection. The lowest concentration of SARS-CoV-2 viral copies this assay can detect is 138 copies/mL. A negative result does not preclude SARS-Cov-2 infection and should not be used as the sole basis for treatment or other patient management decisions. A negative result may occur with  improper specimen collection/handling, submission of specimen other than nasopharyngeal swab, presence of viral mutation(s) within the areas targeted by this assay, and inadequate number of viral copies(<138 copies/mL). A negative result must be combined with clinical observations, patient history, and epidemiological information. The expected result is Negative.  Fact Sheet for Patients:  EntrepreneurPulse.com.au  Fact Sheet for Healthcare Providers:  IncredibleEmployment.be  This test is no t yet approved or cleared by the [place] FDA and  has been authorized for  detection and/or diagnosis of SARS-CoV-2 by FDA under an Emergency Use Authorization (EUA). This EUA will remain  in effect (meaning this test can be used) for the duration of the COVID-19 declaration under Section 564(b)(1) of the Act, 21 U.S.C.section 360bbb-3(b)(1), unless the  authorization is terminated  or revoked sooner.       Influenza A by PCR NEGATIVE NEGATIVE Final   Influenza B by PCR NEGATIVE NEGATIVE Final    Comment: (NOTE) The Xpert Xpress SARS-CoV-2/FLU/RSV plus assay is intended as an aid in the diagnosis of influenza from Nasopharyngeal swab specimens and should not be used as a sole basis for treatment. Nasal washings and aspirates are unacceptable for Xpert Xpress SARS-CoV-2/FLU/RSV testing.  Fact Sheet for Patients: EntrepreneurPulse.com.au  Fact Sheet for Healthcare Providers: IncredibleEmployment.be  This test is not yet approved or cleared by the [place] FDA and has been authorized for detection and/or diagnosis of SARS-CoV-2 by FDA under an Emergency Use Authorization (EUA). This EUA will remain in effect (meaning this test can be used) for the duration of the COVID-19 declaration under Section 564(b)(1) of the Act, 21 U.S.C. section 360bbb-3(b)(1), unless the authorization is terminated or revoked.  Performed at [facility], [place] [address], [place], [place] [address]   Culture, blood (Routine x 2)     Status: None (Preliminary result)   Collection Time: 02/28/21  8:44 PM   Specimen: BLOOD LEFT ARM  Result Value Ref Range Status   Specimen Description BLOOD LEFT ARM  Final   Special Requests   Final    BOTTLES DRAWN AEROBIC AND ANAEROBIC Blood Culture results may not be optimal due to an inadequate volume of blood received in culture bottles   Culture   Final    NO GROWTH < 12 HOURS Performed at [facility], [place] [address] [place], [place] [address]    Report Status PENDING  Incomplete     RADIOLOGY STUDIES/RESULTS: DG Chest 2 View  Result Date: 02/28/2021 CLINICAL DATA:  Suspected sepsis. EXAM: CHEST - 2 VIEW COMPARISON:  Chest x-ray 02/12/2021. CT chest 01/30/2021. FINDINGS: Heart is enlarged, unchanged. The aorta is tortuous and contains atherosclerotic calcifications. Right mid lung airspace disease is again noted similar to the prior examination. There are stable small bilateral pleural effusions. There are increased central interstitial markings bilaterally. There is no evidence for pneumothorax. No acute fractures are seen. Degenerative changes affect the spine. IMPRESSION: 1. Stable right mid lung airspace disease worrisome for pneumonia. 2. Stable small bilateral pleural effusions. 3. Cardiomegaly with central pulmonary vascular congestion. Electronically Signed   By: Ronney Asters M.D.   On: 02/28/2021 21:16     LOS: 1 day   Oren Binet, MD  [facility]    To contact the attending provider between 7A-7P or the covering provider during after hours 7P-7A, please log into the web site [email or web address] and access using universal [place] password for that web site. If you do not have the password, please call the hospital operator.  03/01/2021, 11:27 AM
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
## 2021-03-01 NOTE — Sepsis Progress Note (Signed)
Code Sepsis initiated @ 2216, [place] following.

## 2021-03-01 NOTE — Progress Notes (Signed)
Pt refusing cpap for the night. ?

## 2021-03-01 NOTE — ED Notes (Addendum)
Dr. Myna Hidalgo notified of pt bp - pt reporting no pain or BP at this time

## 2021-03-01 NOTE — Progress Notes (Signed)
Heart Failure Nurse Navigator Progress Note  Following admission for HV TOC readiness. SCr 2.89 at present. Will continue to follow for renal recovery.   Pricilla Holm, MSN, RN Heart Failure Nurse Navigator [phone]

## 2021-03-01 NOTE — ED Notes (Signed)
Nurse report given to Chat, RN 

## 2021-03-01 NOTE — Sepsis Progress Note (Signed)
[facility] Code Sepsis Completion Note:  Pt had BC drawn, ABX administered, and LA was 1.1. Did not have fluid replacement due to resp distress, SOB, cough, wheezing and CXR suspects pneumonia. Pt being admitted to Progressive unit for further monitoring.   Dalyn Becker DNP Warren Lacy RN [phone] AM

## 2021-03-02 ENCOUNTER — Encounter (INDEPENDENT_AMBULATORY_CARE_PROVIDER_SITE_OTHER): Payer: PPO | Admitting: Ophthalmology

## 2021-03-02 DIAGNOSIS — R778 Other specified abnormalities of plasma proteins: Secondary | ICD-10-CM | POA: Diagnosis not present

## 2021-03-02 DIAGNOSIS — I5033 Acute on chronic diastolic (congestive) heart failure: Secondary | ICD-10-CM | POA: Diagnosis not present

## 2021-03-02 DIAGNOSIS — J9621 Acute and chronic respiratory failure with hypoxia: Secondary | ICD-10-CM | POA: Diagnosis not present

## 2021-03-02 DIAGNOSIS — D649 Anemia, unspecified: Secondary | ICD-10-CM | POA: Diagnosis not present

## 2021-03-02 LAB — CBC
HCT: 27 % — ABNORMAL LOW (ref 39.0–52.0)
Hemoglobin: 8.1 g/dL — ABNORMAL LOW (ref 13.0–17.0)
MCH: 28 pg (ref 26.0–34.0)
MCHC: 30 g/dL (ref 30.0–36.0)
MCV: 93.4 fL (ref 80.0–100.0)
Platelets: 196 10*3/uL (ref 150–400)
RBC: 2.89 MIL/uL — ABNORMAL LOW (ref 4.22–5.81)
RDW: 16.3 % — ABNORMAL HIGH (ref 11.5–15.5)
WBC: 5.8 10*3/uL (ref 4.0–10.5)
nRBC: 0 % (ref 0.0–0.2)

## 2021-03-02 LAB — LEGIONELLA PNEUMOPHILA SEROGP 1 UR AG: L. pneumophila Serogp 1 Ur Ag: NEGATIVE

## 2021-03-02 LAB — COMPREHENSIVE METABOLIC PANEL
ALT: 65 U/L — ABNORMAL HIGH (ref 0–44)
AST: 34 U/L (ref 15–41)
Albumin: 2.2 g/dL — ABNORMAL LOW (ref 3.5–5.0)
Alkaline Phosphatase: 59 U/L (ref 38–126)
Anion gap: 7 (ref 5–15)
BUN: 44 mg/dL — ABNORMAL HIGH (ref 8–23)
CO2: 25 mmol/L (ref 22–32)
Calcium: 8.5 mg/dL — ABNORMAL LOW (ref 8.9–10.3)
Chloride: 107 mmol/L (ref 98–111)
Creatinine, Ser: 2.83 mg/dL — ABNORMAL HIGH (ref 0.61–1.24)
GFR, Estimated: 23 mL/min — ABNORMAL LOW (ref >60)
Glucose, Bld: 81 mg/dL (ref 70–99)
Potassium: 4.1 mmol/L (ref 3.5–5.1)
Sodium: 139 mmol/L (ref 135–145)
Total Bilirubin: 0.8 mg/dL (ref 0.3–1.2)
Total Protein: 5.9 g/dL — ABNORMAL LOW (ref 6.5–8.1)

## 2021-03-02 LAB — GLUCOSE, CAPILLARY
Glucose-Capillary: 83 mg/dL (ref 70–99)
Glucose-Capillary: 130 mg/dL — ABNORMAL HIGH (ref 70–99)
Glucose-Capillary: 145 mg/dL — ABNORMAL HIGH (ref 70–99)
Glucose-Capillary: 174 mg/dL — ABNORMAL HIGH (ref 70–99)

## 2021-03-02 LAB — TYPE AND SCREEN
ABO/RH(D): O POS
Antibody Screen: NEGATIVE
Unit division: 0

## 2021-03-02 LAB — BPAM RBC
Blood Product Expiration Date: 202209302359
ISSUE DATE / TIME: 202209080037
Unit Type and Rh: 5100

## 2021-03-02 LAB — BRAIN NATRIURETIC PEPTIDE: B Natriuretic Peptide: 1340.6 pg/mL — ABNORMAL HIGH (ref 0.0–100.0)

## 2021-03-02 LAB — PROCALCITONIN: Procalcitonin: 0.21 ng/mL

## 2021-03-02 MED ORDER — SODIUM CHLORIDE 0.9 % IV SOLN
3.0000 g | Freq: Two times a day (BID) | INTRAVENOUS | Status: DC
  Administered 2021-03-02 – 2021-03-05 (×6): 3 g via INTRAVENOUS
  Filled 2021-03-02 (×7): qty 8

## 2021-03-02 NOTE — Progress Notes (Signed)
PROGRESS NOTE        PATIENT DETAILS Name: Robert Chaney Age: 70 y.o. Sex: male Date of Birth: 1950/08/30 Admit Date: 02/28/2021 Admitting Physician Vianne Bulls, MD GUR:[id number], Christean Grief, MD  Brief Narrative: Patient is a 70 y.o. male with history of CKD stage IV, HOCM, HFpEF, IDDM, HTN, OSA on CPAP, persistent RML infiltrate on chest imaging, chronic hypoxic respiratory failure on 3-4 L of oxygen at home-presented with worsening shortness of breath, chills, dark stools and fatigue.  He was found to have acute on chronic hypoxic respiratory failure due to a combination of right middle lobe infiltrate/HFpEF with exacerbation and worsening of his baseline hemoglobin.  He was subsequently admitted to the hospitalist service.  See below for further details.  Significant events: 9/7>> admit for shortness of breath/fever/black stools-worsening hypoxemia-imaging with persistent right middle lobe infiltrate-on BiPAP-started on antibiotics/Lasix and PRBC transfusion.   9/8>> liberated off BiPAP-placed on O2 3-4 L  Significant studies: 8/9>> Echo: EF 70-75% 9/7>> CXR: Right midlung airspace disease worrisome for pneumonia.  Antimicrobial therapy: Vancomycin: 9/7>> 9/9 Cefepime: 9/7>> 9/9 Unasyn: 9/9>>  Microbiology data: 9/7>> COVID/influenza PCR: Negative 9/7>> blood culture: No growth  Procedures : None  Consults: Cardiology, PCCM, GI  DVT Prophylaxis : SCDs Start: 02/28/21 2322   Subjective: Feels significantly better than yesterday.  Stable on 3 L of oxygen this morning.   Assessment/Plan: Acute on chronic hypoxic respiratory failure: Secondary to HFpEF exacerbation and postobstructive pneumonia.  Initially on BiPAP-liberated of the BiPAP at 9/8.  He is back on his usual regimen of 3-4 L of oxygen.  Continue Lasix-and empiric antimicrobial therapy.    Persistent right middle lobe infiltrate likely postobstructive pneumonia: Suspicion for a  endobronchial lesion -PCCM planning on bronchoscopy early next week.  Stop vancomycin/cefepime-switch to Unasyn.   HFpEF with exacerbation: Improved-on diuretics-cardiology following.  Need to be cautious with preload reduction given history of HOCM.    Elevated troponins: Probably related demand ischemia in the setting of anemia/respiratory failure.  Appreciate cardiology input.  Due to concern for GI bleeding/severity of anemia-hold off on antiplatelets for now.  HOCM: Watch closely-minimize preload reduction is much as possible.  Resume beta-blocker.  ?  Upper GI bleed: Apparently has a black stools for the past several weeks-no BM since admission.  Since his respiratory status is stabilized-have consulted GI.  Continue PPI.    Anemia: Multifactorial-has anemia related to CKD at baseline-question of GI bleeding and acute blood loss as well.  Hemoglobin stable after 1 unit of PRBC on admission-continue to watch closely.    HTN: BP much better after resumption of his usual medications-continue amlodipine/Coreg/clonidine.    DM-2 (A1c 5.8 on 7/22): CBG stable-continue 18 units of Lantus, 4 units of NovoLog with meals and SSI.   Recent Labs    03/01/21 2151 03/02/21 0623 03/02/21 1145  GLUCAP 139* 83 130*      CKD stage IV: Creatinine close to baseline-avoid nephrotoxic agents-follow.  History of renal artery stenosis  OSA: On CPAP nightly   Diet: Diet Order             Diet clear liquid Room service appropriate? Yes; Fluid consistency: Thin  Diet effective now                    Code Status: Full code   Family Communication: None at  bedside  Disposition Plan: Status is: Inpatient  Remains inpatient appropriate because:Inpatient level of care appropriate due to severity of illness  Dispo: The patient is from: Home              Anticipated d/c is to: Home              Patient currently is not medically stable to d/c.   Difficult to place patient  No    Barriers to Discharge: On BiPAP-on IV antibiotics/IV Lasix-not yet stable for discharge.  Antimicrobial agents: Anti-infectives (From admission, onward)    Start     Dose/Rate Route Frequency Ordered Stop   03/01/21 2300  vancomycin (VANCOREADY) IVPB 750 mg/150 mL        750 mg 150 mL/hr over 60 Minutes Intravenous Every 24 hours 02/28/21 2347     03/01/21 0000  vancomycin (VANCOREADY) IVPB 1500 mg/300 mL        1,500 mg 150 mL/hr over 120 Minutes Intravenous  Once 02/28/21 2347 03/01/21 0215   02/28/21 2345  ceFEPIme (MAXIPIME) 2 g in sodium chloride 0.9 % 100 mL IVPB       Note to Pharmacy: Cefepime 2 g IV q24h for CrCl < 30 mL/min   2 g 200 mL/hr over 30 Minutes Intravenous Daily at bedtime 02/28/21 2326 03/05/21 2159   02/28/21 2230  doxycycline (VIBRAMYCIN) 100 mg in sodium chloride 0.9 % 250 mL IVPB        100 mg 125 mL/hr over 120 Minutes Intravenous  Once 02/28/21 2219 03/01/21 0050        Time spent:35 minutes-Greater than 50% of this time was spent in counseling, explanation of diagnosis, planning of further management, and coordination of care.  MEDICATIONS: Scheduled Meds:  amLODipine  10 mg Oral Daily   atorvastatin  20 mg Oral Daily   carvedilol  12.5 mg Oral BID   cloNIDine  0.1 mg Oral q morning   And   cloNIDine  0.2 mg Oral QHS   furosemide  40 mg Intravenous BID   insulin aspart  0-9 Units Subcutaneous TID WC   insulin aspart  4 Units Subcutaneous TID WC   insulin glargine-yfgn  18 Units Subcutaneous QHS   pantoprazole (PROTONIX) IV  40 mg Intravenous Q12H   pneumococcal 23 valent vaccine  0.5 mL Intramuscular Tomorrow-1000   Continuous Infusions:  ceFEPime (MAXIPIME) IV 2 g (03/01/21 2157)   vancomycin 750 mg (03/01/21 2340)   PRN Meds:.acetaminophen **OR** acetaminophen   PHYSICAL EXAM: Vital signs: Vitals:   03/02/21 0305 03/02/21 0328 03/02/21 0627 03/02/21 1147  BP: 134/73 (!) 152/75  (!) 150/70  Pulse: (!) 56 (!) 54  (!) 56   Resp: 19 20  16   Temp: (!) 97.5 F (36.4 C) 97.6 F (36.4 C)  97.9 F (36.6 C)  TempSrc: Oral Oral  Oral  SpO2: 99% 100%  99%  Weight:   71.5 kg   Height:       Filed Weights   02/28/21 2045 03/01/21 1932 03/02/21 0627  Weight: 77.1 kg 73.8 kg 71.5 kg   Body mass index is 23.97 kg/m.   Gen Exam:Alert awake-not in any distress HEENT:atraumatic, normocephalic Chest: B/L clear to auscultation anteriorly CVS:S1S2 regular Abdomen:soft non tender, non distended Extremities:no edema Neurology: Non focal Skin: no rash   I have personally reviewed following labs and imaging studies  LABORATORY DATA: CBC: Recent Labs  Lab 02/28/21 2042 02/28/21 2051 03/01/21 0845 03/01/21 2027 03/02/21 0540  WBC 4.4  --  4.3  --  5.8  NEUTROABS 2.7  --   --   --   --   HGB 6.7* 7.1* 9.3* 9.6* 8.1*  HCT 22.7* 21.0* 31.9* 31.9* 27.0*  MCV 95.0  --  95.2  --  93.4  PLT 189  --  225  --  196     Basic Metabolic Panel: Recent Labs  Lab 02/28/21 2042 02/28/21 2051 03/02/21 0540  NA 137 142 139  K 4.6 4.7 4.1  CL 108  --  107  CO2 24  --  25  GLUCOSE 280*  --  81  BUN 41*  --  44*  CREATININE 2.93*  --  2.83*  CALCIUM 8.3*  --  8.5*     GFR: Estimated Creatinine Clearance: 22.8 mL/min (A) (by C-G formula based on SCr of 2.83 mg/dL (H)).  Liver Function Tests: Recent Labs  Lab 02/28/21 2042 03/02/21 0540  AST 74* 34  ALT 74* 65*  ALKPHOS 58 59  BILITOT 0.7 0.8  PROT 6.0* 5.9*  ALBUMIN 2.4* 2.2*    No results for input(s): LIPASE, AMYLASE in the last 168 hours. No results for input(s): AMMONIA in the last 168 hours.  Coagulation Profile: Recent Labs  Lab 02/28/21 2042  INR 1.2     Cardiac Enzymes: No results for input(s): CKTOTAL, CKMB, CKMBINDEX, TROPONINI in the last 168 hours.  BNP (last 3 results) No results for input(s): PROBNP in the last 8760 hours.  Lipid Profile: No results for input(s): CHOL, HDL, LDLCALC, TRIG, CHOLHDL, LDLDIRECT in the last  72 hours.  Thyroid Function Tests: No results for input(s): TSH, T4TOTAL, FREET4, T3FREE, THYROIDAB in the last 72 hours.  Anemia Panel: Recent Labs    02/28/21 2144  FERRITIN 23*  TIBC 339  IRON 19*     Urine analysis:    Component Value Date/Time   COLORURINE YELLOW 03/01/2021 0029   APPEARANCEUR CLEAR 03/01/2021 0029   LABSPEC 1.011 03/01/2021 0029   PHURINE 5.0 03/01/2021 0029   GLUCOSEU NEGATIVE 03/01/2021 0029   HGBUR NEGATIVE 03/01/2021 0029   BILIRUBINUR NEGATIVE 03/01/2021 0029   KETONESUR NEGATIVE 03/01/2021 0029   PROTEINUR 30 (A) 03/01/2021 0029   UROBILINOGEN 1.0 07/19/2011 1335   NITRITE NEGATIVE 03/01/2021 0029   LEUKOCYTESUR MODERATE (A) 03/01/2021 0029    Sepsis Labs: Lactic Acid, Venous    Component Value Date/Time   LATICACIDVEN 1.1 02/28/2021 2042    MICROBIOLOGY: Recent Results (from the past 240 hour(s))  Culture, blood (Routine x 2)     Status: None (Preliminary result)   Collection Time: 02/28/21  8:42 PM   Specimen: BLOOD  Result Value Ref Range Status   Specimen Description BLOOD RIGHT ANTECUBITAL  Final   Special Requests   Final    BOTTLES DRAWN AEROBIC AND ANAEROBIC Blood Culture adequate volume   Culture   Final    NO GROWTH 2 DAYS Performed at [facility], [place] [address], [place], [place] [address]    Report Status PENDING  Incomplete  Resp Panel by RT-PCR (Flu A&B, Covid) Nasopharyngeal Swab     Status: None   Collection Time: 02/28/21  8:43 PM   Specimen: Nasopharyngeal Swab; Nasopharyngeal(NP) swabs in vial transport medium  Result Value Ref Range Status   SARS Coronavirus 2 by RT PCR NEGATIVE NEGATIVE Final    Comment: (NOTE) SARS-CoV-2 target nucleic acids are NOT DETECTED.  The SARS-CoV-2 RNA is generally detectable in upper respiratory specimens during the acute phase of infection.  The lowest concentration of SARS-CoV-2 viral copies this assay can detect is 138 copies/mL. A negative result does not preclude  SARS-Cov-2 infection and should not be used as the sole basis for treatment or other patient management decisions. A negative result may occur with  improper specimen collection/handling, submission of specimen other than nasopharyngeal swab, presence of viral mutation(s) within the areas targeted by this assay, and inadequate number of viral copies(<138 copies/mL). A negative result must be combined with clinical observations, patient history, and epidemiological information. The expected result is Negative.  Fact Sheet for Patients:  EntrepreneurPulse.com.au  Fact Sheet for Healthcare Providers:  IncredibleEmployment.be  This test is no t yet approved or cleared by the [place] FDA and  has been authorized for detection and/or diagnosis of SARS-CoV-2 by FDA under an Emergency Use Authorization (EUA). This EUA will remain  in effect (meaning this test can be used) for the duration of the COVID-19 declaration under Section 564(b)(1) of the Act, 21 U.S.C.section 360bbb-3(b)(1), unless the authorization is terminated  or revoked sooner.       Influenza A by PCR NEGATIVE NEGATIVE Final   Influenza B by PCR NEGATIVE NEGATIVE Final    Comment: (NOTE) The Xpert Xpress SARS-CoV-2/FLU/RSV plus assay is intended as an aid in the diagnosis of influenza from Nasopharyngeal swab specimens and should not be used as a sole basis for treatment. Nasal washings and aspirates are unacceptable for Xpert Xpress SARS-CoV-2/FLU/RSV testing.  Fact Sheet for Patients: EntrepreneurPulse.com.au  Fact Sheet for Healthcare Providers: IncredibleEmployment.be  This test is not yet approved or cleared by the [place] FDA and has been authorized for detection and/or diagnosis of SARS-CoV-2 by FDA under an Emergency Use Authorization (EUA). This EUA will remain in effect (meaning this test can be used) for the duration of  the COVID-19 declaration under Section 564(b)(1) of the Act, 21 U.S.C. section 360bbb-3(b)(1), unless the authorization is terminated or revoked.  Performed at [facility], [place] [address], [place], [place] [address]   Culture, blood (Routine x 2)     Status: None (Preliminary result)   Collection Time: 02/28/21  8:44 PM   Specimen: BLOOD LEFT ARM  Result Value Ref Range Status   Specimen Description BLOOD LEFT ARM  Final   Special Requests   Final    BOTTLES DRAWN AEROBIC AND ANAEROBIC Blood Culture results may not be optimal due to an inadequate volume of blood received in culture bottles   Culture   Final    NO GROWTH 2 DAYS Performed at [facility], [place] [address] [place], [place] [address]    Report Status PENDING  Incomplete  MRSA Next Gen by PCR, Nasal     Status: None   Collection Time: 03/01/21  8:15 PM  Result Value Ref Range Status   MRSA by PCR Next Gen NOT DETECTED NOT DETECTED Final    Comment: (NOTE) The GeneXpert MRSA Assay (FDA approved for NASAL specimens only), is one component of a comprehensive MRSA colonization surveillance program. It is not intended to diagnose MRSA infection nor to guide or monitor treatment for MRSA infections. Test performance is not FDA approved in patients less than 83 years old. Performed at [facility], [place] [address], [place], [place] [address]     RADIOLOGY STUDIES/RESULTS: DG Chest 2 View  Result Date: 02/28/2021 CLINICAL DATA:  Suspected sepsis. EXAM: CHEST - 2 VIEW COMPARISON:  Chest x-ray 02/12/2021. CT chest 01/30/2021. FINDINGS: Heart is enlarged, unchanged.  The aorta is tortuous and contains atherosclerotic calcifications. Right mid lung airspace disease is again noted similar to the prior examination. There are stable small bilateral pleural effusions. There are increased central interstitial markings bilaterally. There is no evidence for pneumothorax. No acute fractures are seen. Degenerative changes  affect the spine. IMPRESSION: 1. Stable right mid lung airspace disease worrisome for pneumonia. 2. Stable small bilateral pleural effusions. 3. Cardiomegaly with central pulmonary vascular congestion. Electronically Signed   By: Ronney Asters M.D.   On: 02/28/2021 21:16     LOS: 2 days   Oren Binet, MD  [facility]    To contact the attending provider between 7A-7P or the covering provider during after hours 7P-7A, please log into the web site [email or web address] and access using universal [place] password for that web site. If you do not have the password, please call the hospital operator.  03/02/2021, 12:46 PM
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
## 2021-03-02 NOTE — Progress Notes (Signed)
   BNP on admission 2300, repeat today 1300. Hemoglobin 8.1.  Creatinine 2.83. Breathing mildly improved.  On exam alert and orient x3, somewhat coarse breath sounds right lung No edema  BP 150/70 pulse 56  EKG-HOCM pattern  Telemetry-no adverse arrhythmias  Assessment and plan:  Hypertrophic obstructive cardiomyopathy - Continue with Lasix 40 mg IV twice daily. -Agree that we have to be careful with overdiuresis, can influence outflow tract gradient.  Thankfully echocardiogram previously did not show any evidence of significant gradient. -Continue with carvedilol, amlodipine, clonidine for hypertension.  Multi lobar pneumonia with acute hypoxic respiratory failure - On Unasyn.  Pulmonary consult reviewed.  Severe anemia - Possible upper GI bleed melena.  Unknown.  PPI IV.  GI note reviewed.  May not be unreasonable to continue with IV diuresis tomorrow and then switch to p.o. after that.  BNP responded nicely.  Candee Furbish, MD
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
## 2021-03-02 NOTE — Progress Notes (Signed)
   03/02/21 1015  Clinical Encounter Type  Visited With Patient not available  Visit Type Initial  Referral From Nurse  Consult/Referral To Chaplain   Chaplain responded to consult request for an Advance Directive. Unable to provide AD education and notary service for the following reasons: due to precautions measures on the patient's door, and hospital volunteers unable to enter room to witness the patient's signature. Advance Directive is available once precautions are removed. This note was prepared by Jeanine Luz, M.Div..  For questions please contact by phone [phone].

## 2021-03-02 NOTE — Progress Notes (Signed)
Pharmacy Antibiotic Note  Robert Chaney is a 70 y.o. male admitted on 02/28/2021 with hypoxia . Broad spectrum ABX to be consolidated to Unasyn. Cr stable ~2.8.  Plan: Stop vancomycin and cefepime Unasyn 3g IV q12h   Height: 5\' 8"  (172.7 cm) Weight: 71.5 kg (157 lb 10.1 oz) IBW/kg (Calculated) : 68.4  Temp (24hrs), Avg:97.8 F (36.6 C), Min:97.5 F (36.4 C), Max:98.2 F (36.8 C)  Recent Labs  Lab 02/28/21 2042 03/01/21 0845 03/02/21 0540  WBC 4.4 4.3 5.8  CREATININE 2.93*  --  2.83*  LATICACIDVEN 1.1  --   --      Estimated Creatinine Clearance: 22.8 mL/min (A) (by C-G formula based on SCr of 2.83 mg/dL (H)).    Allergies  Allergen Reactions   Aspirin Itching    Thank you for allowing pharmacy to be a part of this patient's care.  Arrie Senate, PharmD, BCPS, [facility] Clinical Pharmacist [phone] Please check AMION for all [place] numbers 03/02/2021

## 2021-03-02 NOTE — Progress Notes (Signed)
Heart Failure Nurse Navigator Progress Note  Following for HV TOC readiness. Will assess pending SCr improvement, 2.83 at present.   Pricilla Holm, MSN, RN Heart Failure Nurse Navigator [phone]

## 2021-03-03 ENCOUNTER — Inpatient Hospital Stay (HOSPITAL_COMMUNITY): Payer: PPO | Admitting: Certified Registered Nurse Anesthetist

## 2021-03-03 ENCOUNTER — Encounter (HOSPITAL_COMMUNITY)
Admission: EM | Disposition: A | Discharge status: Home Health | Payer: Self-pay | Source: Home / Self Care | Attending: Internal Medicine

## 2021-03-03 ENCOUNTER — Encounter (HOSPITAL_COMMUNITY): Payer: Self-pay | Admitting: Family Medicine

## 2021-03-03 DIAGNOSIS — D175 Benign lipomatous neoplasm of intra-abdominal organs: Secondary | ICD-10-CM

## 2021-03-03 DIAGNOSIS — J9621 Acute and chronic respiratory failure with hypoxia: Secondary | ICD-10-CM | POA: Diagnosis not present

## 2021-03-03 DIAGNOSIS — I5033 Acute on chronic diastolic (congestive) heart failure: Secondary | ICD-10-CM | POA: Diagnosis not present

## 2021-03-03 DIAGNOSIS — K2970 Gastritis, unspecified, without bleeding: Secondary | ICD-10-CM

## 2021-03-03 HISTORY — PX: ESOPHAGOGASTRODUODENOSCOPY (EGD) WITH PROPOFOL: SHX5813

## 2021-03-03 HISTORY — PX: BIOPSY: SHX5522

## 2021-03-03 LAB — COMPREHENSIVE METABOLIC PANEL
ALT: 66 U/L — ABNORMAL HIGH (ref 0–44)
AST: 32 U/L (ref 15–41)
Albumin: 2.3 g/dL — ABNORMAL LOW (ref 3.5–5.0)
Alkaline Phosphatase: 53 U/L (ref 38–126)
Anion gap: 8 (ref 5–15)
BUN: 46 mg/dL — ABNORMAL HIGH (ref 8–23)
CO2: 27 mmol/L (ref 22–32)
Calcium: 8.5 mg/dL — ABNORMAL LOW (ref 8.9–10.3)
Chloride: 102 mmol/L (ref 98–111)
Creatinine, Ser: 2.7 mg/dL — ABNORMAL HIGH (ref 0.61–1.24)
GFR, Estimated: 24 mL/min — ABNORMAL LOW (ref >60)
Glucose, Bld: 92 mg/dL (ref 70–99)
Potassium: 3.8 mmol/L (ref 3.5–5.1)
Sodium: 137 mmol/L (ref 135–145)
Total Bilirubin: 0.9 mg/dL (ref 0.3–1.2)
Total Protein: 5.9 g/dL — ABNORMAL LOW (ref 6.5–8.1)

## 2021-03-03 LAB — CBC
HCT: 26.1 % — ABNORMAL LOW (ref 39.0–52.0)
Hemoglobin: 7.9 g/dL — ABNORMAL LOW (ref 13.0–17.0)
MCH: 28 pg (ref 26.0–34.0)
MCHC: 30.3 g/dL (ref 30.0–36.0)
MCV: 92.6 fL (ref 80.0–100.0)
Platelets: 186 10*3/uL (ref 150–400)
RBC: 2.82 MIL/uL — ABNORMAL LOW (ref 4.22–5.81)
RDW: 16.1 % — ABNORMAL HIGH (ref 11.5–15.5)
WBC: 5.8 10*3/uL (ref 4.0–10.5)
nRBC: 0 % (ref 0.0–0.2)

## 2021-03-03 LAB — GLUCOSE, CAPILLARY
Glucose-Capillary: 40 mg/dL — CL (ref 70–99)
Glucose-Capillary: 153 mg/dL — ABNORMAL HIGH (ref 70–99)
Glucose-Capillary: 151 mg/dL — ABNORMAL HIGH (ref 70–99)
Glucose-Capillary: 64 mg/dL — ABNORMAL LOW (ref 70–99)
Glucose-Capillary: 159 mg/dL — ABNORMAL HIGH (ref 70–99)
Glucose-Capillary: 203 mg/dL — ABNORMAL HIGH (ref 70–99)
Glucose-Capillary: 158 mg/dL — ABNORMAL HIGH (ref 70–99)

## 2021-03-03 LAB — QUANTIFERON-TB GOLD PLUS (RQFGPL)
QuantiFERON Mitogen Value: 3.28 IU/mL
QuantiFERON Nil Value: 0 IU/mL
QuantiFERON TB1 Ag Value: 0 IU/mL
QuantiFERON TB2 Ag Value: 0.01 IU/mL

## 2021-03-03 LAB — QUANTIFERON-TB GOLD PLUS: QuantiFERON-TB Gold Plus: NEGATIVE

## 2021-03-03 SURGERY — ESOPHAGOGASTRODUODENOSCOPY (EGD) WITH PROPOFOL
Anesthesia: Monitor Anesthesia Care

## 2021-03-03 MED ORDER — ONDANSETRON HCL 4 MG/2ML IJ SOLN
4.0000 mg | Freq: Four times a day (QID) | INTRAMUSCULAR | Status: DC | PRN
  Administered 2021-03-05: 4 mg via INTRAVENOUS

## 2021-03-03 MED ORDER — DEXTROSE-NACL 5-0.45 % IV SOLN: INTRAVENOUS | Status: AC

## 2021-03-03 MED ORDER — DEXTROSE 50 % IV SOLN
25.0000 g | INTRAVENOUS | Status: AC
  Administered 2021-03-03: 25 g via INTRAVENOUS

## 2021-03-03 MED ORDER — PROPOFOL 500 MG/50ML IV EMUL
INTRAVENOUS | Status: DC | PRN
  Administered 2021-03-03: 150 ug/kg/min via INTRAVENOUS

## 2021-03-03 MED ORDER — SODIUM CHLORIDE 0.9 % IV SOLN
INTRAVENOUS | Status: DC | PRN
Start: 2021-03-03 — End: 2021-03-03

## 2021-03-03 MED ORDER — DEXTROSE 50 % IV SOLN
INTRAVENOUS | Status: AC
  Filled 2021-03-03: qty 50

## 2021-03-03 MED ORDER — INSULIN GLARGINE-YFGN 100 UNIT/ML ~~LOC~~ SOLN
14.0000 [IU] | Freq: Every day | SUBCUTANEOUS | Status: DC
  Filled 2021-03-03: qty 0.14

## 2021-03-03 MED ORDER — INSULIN GLARGINE-YFGN 100 UNIT/ML ~~LOC~~ SOLN
12.0000 [IU] | Freq: Every day | SUBCUTANEOUS | Status: DC
  Administered 2021-03-03 – 2021-03-08 (×6): 12 [IU] via SUBCUTANEOUS
  Filled 2021-03-03 (×7): qty 0.12

## 2021-03-03 MED ORDER — LIDOCAINE 2% (20 MG/ML) 5 ML SYRINGE
INTRAMUSCULAR | Status: DC | PRN
  Administered 2021-03-03: 40 mg via INTRAVENOUS

## 2021-03-03 MED ORDER — CLONIDINE HCL 0.1 MG PO TABS
0.2000 mg | ORAL_TABLET | Freq: Two times a day (BID) | ORAL | Status: DC
  Administered 2021-03-03 – 2021-03-06 (×7): 0.2 mg via ORAL
  Filled 2021-03-03 (×7): qty 2

## 2021-03-03 MED ORDER — DEXTROSE 50 % IV SOLN
INTRAVENOUS | Status: AC
  Administered 2021-03-03: 50 mL
  Filled 2021-03-03: qty 50

## 2021-03-03 MED ORDER — PHENYLEPHRINE 40 MCG/ML (10ML) SYRINGE FOR IV PUSH (FOR BLOOD PRESSURE SUPPORT)
PREFILLED_SYRINGE | INTRAVENOUS | Status: DC | PRN
  Administered 2021-03-03: 80 ug via INTRAVENOUS

## 2021-03-03 MED ORDER — INSULIN ASPART 100 UNIT/ML IJ SOLN
2.0000 [IU] | Freq: Three times a day (TID) | INTRAMUSCULAR | Status: DC
  Administered 2021-03-03 – 2021-03-08 (×7): 2 [IU] via SUBCUTANEOUS

## 2021-03-03 MED ORDER — PROPOFOL 10 MG/ML IV BOLUS
INTRAVENOUS | Status: DC | PRN
  Administered 2021-03-03: 20 mg via INTRAVENOUS

## 2021-03-03 SURGICAL SUPPLY — 15 items

## 2021-03-03 NOTE — Progress Notes (Signed)
Patient refused CPAP/BIPAP hs tonight. Patient in no distress at this time.

## 2021-03-03 NOTE — Op Note (Addendum)
[facility] Patient Name: Robert Chaney Procedure Date : 03/03/2021 MRN: [id number] Attending MD: Jackquline Denmark , MD Date of Birth: 1950/11/23 CSN: [id number] Age: 70 Admit Type: Inpatient Procedure:                Upper GI endoscopy Indications:              Suspected upper gastrointestinal bleeding. Upper GI                            tract needs to be cleared prior to bronchoscopy. Providers:                Jackquline Denmark, MD, Doristine Johns, RN, Tyrone Apple, Technician, Clearnce Sorrel, CRNA Referring MD:              Medicines:                Monitored Anesthesia Care Complications:            No immediate complications. Estimated Blood Loss:     Estimated blood loss: none. Procedure:                Pre-Anesthesia Assessment:                           - Prior to the procedure, a History and Physical                            was performed, and patient medications and                            allergies were reviewed. The patient's tolerance of                            previous anesthesia was also reviewed. The risks                            and benefits of the procedure and the sedation                            options and risks were discussed with the patient.                            All questions were answered, and informed consent                            was obtained. Prior Anticoagulants: The patient has                            taken no previous anticoagulant or antiplatelet                            agents. ASA Grade Assessment: III - A patient with  severe systemic disease. After reviewing the risks                            and benefits, the patient was deemed in                            satisfactory condition to undergo the procedure.                           After obtaining informed consent, the endoscope was                            passed under direct vision. Throughout the                             procedure, the patient's blood pressure, pulse, and                            oxygen saturations were monitored continuously. The                            [id number]-H190 ([id number] Olympus endoscope was introduced                            through the mouth, and advanced to the second part                            of duodenum. The upper GI endoscopy was                            accomplished without difficulty. The patient                            tolerated the procedure well. Scope In: Scope Out: Findings:      The examined esophagus was normal with well-defined Z-line at 42 cm.      Localized minimal inflammation characterized by erythema and friability       was found in the gastric antrum. Biopsies were taken with a cold forceps       for histology.      There was a medium-sized submucosal lesion (likely lipoma), 15 mm in       diameter, in the cardia. This was best visualized on the retroflexed       examination.      The examined duodenum was normal. Impression:               -Minimal gastritis.                           -Incidental submucosal lesion (likely lipoma) in                            the gastric cardia.                           -Otherwise normal EGD. Recommendation:           -  Return patient to hospital ward for ongoing care.                           - Advance diet.                           - Continue present medications.                           - Trend CBC. Keep Hb>7.                           - Proceed with pulmonary work-up including                            bronchoscopy on Monday as planned.                           - Await pathology results.                           - He would eventually need colonoscopy for further                            evaluation when pulmonary work-up (for suspected                            malignancy) is complete unless any further bleeding.                           - If any further eval is needed for  submucosal                            gastric lesion, would recommend outpt EUS.                           - Will sign off for now. Pl call if any problems or                            change in clinical status.                           - The findings and recommendations were discussed                            with the patient's family. 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 All rights reserved. The codes documented in this report are preliminary and upon coder review may  be revised to meet current compliance requirements. Jackquline Denmark, MD 03/03/2021 2:09:00 PM This report has been signed electronically. Number of Addenda: 0

## 2021-03-03 NOTE — Anesthesia Preprocedure Evaluation (Addendum)
Anesthesia Evaluation  Patient identified by MRN, date of birth, ID band Patient awake    Reviewed: Allergy & Precautions, NPO status , Patient's Chart, lab work & pertinent test results  Airway Mallampati: II  TM Distance: >3 FB Neck ROM: Full    Dental  (+) Teeth Intact, Dental Advisory Given   Pulmonary sleep apnea ,    breath sounds clear to auscultation       Cardiovascular hypertension, Pt. on medications and Pt. on home beta blockers +CHF   Rhythm:Regular Rate:Normal  Echo: 1. Peak outflow gradient velocity 3.35 m/s, peak gradient 45 mmHg. Left  ventricular ejection fraction, by estimation, is 70 to 75%. The left  ventricle has hyperdynamic function. The left ventricle has no regional  wall motion abnormalities. There is  severe left ventricular hypertrophy. Left ventricular diastolic parameters  are consistent with Grade I diastolic dysfunction (impaired relaxation).  2. Right ventricular systolic function is normal. The right ventricular  size is normal.  3. The mitral valve is normal in structure. No evidence of mitral valve  regurgitation. No evidence of mitral stenosis.  4. The aortic valve is normal in structure. Aortic valve regurgitation is  not visualized. No aortic stenosis is present.  5. The inferior vena cava is normal in size with greater than 50%  respiratory variability, suggesting right atrial pressure of 3 mmHg.    Neuro/Psych  Neuromuscular disease negative psych ROS   GI/Hepatic negative GI ROS,   Endo/Other  diabetes, Type 2, Insulin Dependent  Renal/GU CRF and Renal InsufficiencyRenal disease     Musculoskeletal negative musculoskeletal ROS (+)   Abdominal Normal abdominal exam  (+)   Peds  Hematology  (+) anemia ,   Anesthesia Other Findings   Reproductive/Obstetrics                            Anesthesia Physical Anesthesia Plan  ASA: 3  Anesthesia  Plan: MAC   Post-op Pain Management:    Induction: Intravenous  PONV Risk Score and Plan: 0 and Propofol infusion  Airway Management Planned: Natural Airway and Simple Face Mask  Additional Equipment: None  Intra-op Plan:   Post-operative Plan:   Informed Consent: I have reviewed the patients History and Physical, chart, labs and discussed the procedure including the risks, benefits and alternatives for the proposed anesthesia with the patient or authorized representative who has indicated his/her understanding and acceptance.   Patient has DNR.  Discussed DNR with patient and Suspend DNR.   Dental advisory given  Plan Discussed with: CRNA  Anesthesia Plan Comments:       Anesthesia Quick Evaluation
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
## 2021-03-03 NOTE — Interval H&P Note (Signed)
History and Physical Interval Note:  03/03/2021 1:41 PM  Robert Chaney  has presented today for surgery, with the diagnosis of Anemia, GI bleed.  The various methods of treatment have been discussed with the patient and family. After consideration of risks, benefits and other options for treatment, the patient has consented to  Procedure(s): ESOPHAGOGASTRODUODENOSCOPY (EGD) WITH PROPOFOL (N/A) as a surgical intervention.  The patient's history has been reviewed, patient examined, no change in status, stable for surgery.  I have reviewed the patient's chart and labs.  Questions were answered to the patient's satisfaction.     Robert Chaney

## 2021-03-03 NOTE — Progress Notes (Signed)
Patient TB test came back negative at 1336.
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
## 2021-03-03 NOTE — Progress Notes (Signed)
PROGRESS NOTE        PATIENT DETAILS Name: Robert Chaney Age: 70 y.o. Sex: male Date of Birth: [birth year] Admit Date: 02/28/2021 Admitting Physician Vianne Bulls, MD [id number], Christean Grief, MD  Brief Narrative: Patient is a 71 y.o. male with history of CKD stage IV, HOCM, HFpEF, IDDM, HTN, OSA on CPAP, persistent RML infiltrate on chest imaging, chronic hypoxic respiratory failure on 3-4 L of oxygen at home-presented with worsening shortness of breath, chills, dark stools and fatigue.  He was found to have acute on chronic hypoxic respiratory failure due to a combination of right middle lobe infiltrate/HFpEF with exacerbation and worsening of his baseline hemoglobin.  He was subsequently admitted to the hospitalist service.  See below for further details.  Significant events: 9/7>> admit for shortness of breath/fever/black stools-worsening hypoxemia-imaging with persistent right middle lobe infiltrate-on BiPAP-started on antibiotics/Lasix and PRBC transfusion.   9/8>> liberated off BiPAP-placed on O2 3-4 L  Significant studies: 8/9>> Echo: EF 70-75% 9/7>> CXR: Right midlung airspace disease worrisome for pneumonia.  Antimicrobial therapy: Vancomycin: 9/7>> 9/9 Cefepime: 9/7>> 9/9 Unasyn: 9/9>>  Microbiology data: 9/7>> COVID/influenza PCR: Negative 9/7>> blood culture: No growth  Procedures : None  Consults: Cardiology, PCCM, GI  DVT Prophylaxis : Place and maintain sequential compression device Start: 03/03/21 0724 SCDs Start: 02/28/21 2322   Subjective:  Patient in bed, appears comfortable, denies any headache, no fever, no chest pain or pressure, no shortness of breath , no abdominal pain. No new focal weakness.  Assessment/Plan:  Acute on chronic hypoxic respiratory failure: Secondary to HFpEF exacerbation and postobstructive pneumonia.  Initially on BiPAP-liberated of the BiPAP at 9/8.  He is back on his usual regimen of 3-4 L of oxygen.   Continue Lasix-and empiric antimicrobial therapy.    Persistent right middle lobe infiltrate likely postobstructive pneumonia: Suspicion for a endobronchial lesion -PCCM planning on bronchoscopy early next week.  Stop vancomycin/cefepime-switch to Unasyn.   HFpEF with exacerbation: Improved-on diuretics-cardiology following.  Need to be cautious with preload reduction given history of HOCM.    ?  Upper GI bleed: Apparently has a black stools for the past several weeks-no BM since admission.  Since his respiratory status is stabilized-have consulted GI.  Continue PPI. EGD likely 03/03/21.  Elevated troponins: Probably related demand ischemia in the setting of anemia/respiratory failure.  Appreciate cardiology input.  Due to concern for GI bleeding/severity of anemia-hold off on antiplatelets for now.  HOCM: Watch closely-minimize preload reduction is much as possible.  Resume beta-blocker.  Anemia: Multifactorial-has anemia related to CKD at baseline-question of GI bleeding and acute blood loss as well.  Hemoglobin stable after 1 unit of PRBC on admission-continue to watch closely.    HTN: BP much better after resumption of his usual medications-continue amlodipine/Coreg/clonidine, adjusted further 03/03/21.  CKD stage IV: Creatinine close to baseline-avoid nephrotoxic agents-follow.  History of renal artery stenosis  OSA: On CPAP nightly  DM-2 (A1c 5.8 on 7/22): CBG low am 03/03/21, Insulin adjusted, will monitor   Recent Labs    03/02/21 2026 03/03/21 0603 03/03/21 0632  GLUCAP 174* 40* 153*       Diet: Diet Order             Diet NPO time specified  Diet effective midnight  Code Status: Full code   Family Communication: None at bedside  Disposition Plan: Status is: Inpatient  Remains inpatient appropriate because:Inpatient level of care appropriate due to severity of illness  Dispo: The patient is from: Home              Anticipated d/c is  to: Home              Patient currently is not medically stable to d/c.   Difficult to place patient No    Barriers to Discharge: On BiPAP-on IV antibiotics/IV Lasix-not yet stable for discharge.  Antimicrobial agents: Anti-infectives (From admission, onward)    Start     Dose/Rate Route Frequency Ordered Stop   03/02/21 1400  Ampicillin-Sulbactam (UNASYN) 3 g in sodium chloride 0.9 % 100 mL IVPB        3 g 200 mL/hr over 30 Minutes Intravenous Every 12 hours 03/02/21 1314     03/01/21 2300  vancomycin (VANCOREADY) IVPB 750 mg/150 mL  Status:  Discontinued        750 mg 150 mL/hr over 60 Minutes Intravenous Every 24 hours 02/28/21 2347 03/02/21 1258   03/01/21 0000  vancomycin (VANCOREADY) IVPB 1500 mg/300 mL        1,500 mg 150 mL/hr over 120 Minutes Intravenous  Once 02/28/21 2347 03/01/21 0215   02/28/21 2345  ceFEPIme (MAXIPIME) 2 g in sodium chloride 0.9 % 100 mL IVPB  Status:  Discontinued       Note to Pharmacy: Cefepime 2 g IV q24h for CrCl < 30 mL/min   2 g 200 mL/hr over 30 Minutes Intravenous Daily at bedtime 02/28/21 2326 03/02/21 1258   02/28/21 2230  doxycycline (VIBRAMYCIN) 100 mg in sodium chloride 0.9 % 250 mL IVPB        100 mg 125 mL/hr over 120 Minutes Intravenous  Once 02/28/21 2219 03/01/21 0050        Time spent:35 minutes-Greater than 50% of this time was spent in counseling, explanation of diagnosis, planning of further management, and coordination of care.  MEDICATIONS: Scheduled Meds:  amLODipine  10 mg Oral Daily   atorvastatin  20 mg Oral Daily   carvedilol  12.5 mg Oral BID   cloNIDine  0.2 mg Oral BID   furosemide  40 mg Intravenous BID   insulin aspart  0-9 Units Subcutaneous TID WC   insulin aspart  2 Units Subcutaneous TID WC   insulin glargine-yfgn  12 Units Subcutaneous QHS   pantoprazole (PROTONIX) IV  40 mg Intravenous Q12H   pneumococcal 23 valent vaccine  0.5 mL Intramuscular Tomorrow-1000   Continuous Infusions:   ampicillin-sulbactam (UNASYN) IV 3 g (03/03/21 0253)   dextrose 5 % and 0.45% NaCl     PRN Meds:.acetaminophen **OR** [DISCONTINUED] acetaminophen, ondansetron (ZOFRAN) IV   PHYSICAL EXAM: Vital signs: Vitals:   03/02/21 2354 03/03/21 0349 03/03/21 0423 03/03/21 0800  BP: 126/69 (!) 164/80 135/75 (!) 148/68  Pulse: (!) 55 60 (!) 57 (!) 56  Resp: 16 20 20 13   Temp: 97.9 F (36.6 C) 97.7 F (36.5 C)  97.8 F (36.6 C)  TempSrc: Oral Oral  Oral  SpO2: 100% 100% 97% 97%  Weight:  71.9 kg    Height:       Filed Weights   03/01/21 1932 03/02/21 0627 03/03/21 0349  Weight: 73.8 kg 71.5 kg 71.9 kg   Body mass index is 24.12 kg/m.   Exam  Awake Alert, No new F.N deficits, Normal affect [place].AT,PERRAL  Supple Neck,No JVD, No cervical lymphadenopathy appriciated.  Symmetrical Chest wall movement, Good air movement bilaterally, CTAB RRR,No Gallops, Rubs or new Murmurs, No Parasternal Heave +ve B.Sounds, Abd Soft, No tenderness, No organomegaly appriciated, No rebound - guarding or rigidity. No Cyanosis, Clubbing or edema, No new Rash or bruise   I have personally reviewed following labs and imaging studies  LABORATORY DATA:  Recent Labs  Lab 02/28/21 2042 02/28/21 2051 03/01/21 0845 03/01/21 2027 03/02/21 0540 03/03/21 0036  WBC 4.4  --  4.3  --  5.8 5.8  HGB 6.7* 7.1* 9.3* 9.6* 8.1* 7.9*  HCT 22.7* 21.0* 31.9* 31.9* 27.0* 26.1*  PLT 189  --  225  --  196 186  MCV 95.0  --  95.2  --  93.4 92.6  MCH 28.0  --  27.8  --  28.0 28.0  MCHC 29.5*  --  29.2*  --  30.0 30.3  RDW 16.4*  --  16.1*  --  16.3* 16.1*  LYMPHSABS 1.2  --   --   --   --   --   MONOABS 0.5  --   --   --   --   --   EOSABS 0.0  --   --   --   --   --   BASOSABS 0.0  --   --   --   --   --     Recent Labs  Lab 02/28/21 2042 02/28/21 2051 03/01/21 2027 03/02/21 0540 03/03/21 0036  NA 137 142  --  139 137  K 4.6 4.7  --  4.1 3.8  CL 108  --   --  107 102  CO2 24  --   --  25 27  GLUCOSE 280*   --   --  81 92  BUN 41*  --   --  44* 46*  CREATININE 2.93*  --   --  2.83* 2.70*  CALCIUM 8.3*  --   --  8.5* 8.5*  AST 74*  --   --  34 32  ALT 74*  --   --  65* 66*  ALKPHOS 58  --   --  59 53  BILITOT 0.7  --   --  0.8 0.9  ALBUMIN 2.4*  --   --  2.2* 2.3*  PROCALCITON  --   --  0.24 0.21  --   LATICACIDVEN 1.1  --   --   --   --   INR 1.2  --   --   --   --   BNP 2,327.5*  --   --  1,340.6*  --      RADIOLOGY STUDIES/RESULTS: No results found.   LOS: 3 days   Signature  Lala Lund M.D on 03/03/2021 at 9:49 AM   -  To page go to [email or web address]      03/03/2021, 9:48 AM

## 2021-03-03 NOTE — Progress Notes (Signed)
Recovered pt. Pacu with endo RN

## 2021-03-03 NOTE — Anesthesia Postprocedure Evaluation (Signed)
Anesthesia Post Note  Patient: Robert Chaney  Procedure(s) Performed: ESOPHAGOGASTRODUODENOSCOPY (EGD) WITH PROPOFOL BIOPSY     Patient location during evaluation: PACU Anesthesia Type: MAC Level of consciousness: awake and alert Pain management: pain level controlled Vital Signs Assessment: post-procedure vital signs reviewed and stable Respiratory status: spontaneous breathing, nonlabored ventilation, respiratory function stable and patient connected to nasal cannula oxygen Cardiovascular status: stable and blood pressure returned to baseline Postop Assessment: no apparent nausea or vomiting Anesthetic complications: no   No notable events documented.  Last Vitals:  Vitals:   03/03/21 1700 03/03/21 1945  BP: (!) 148/62 134/68  Pulse: 65 60  Resp: 20 15  Temp:  (!) 36.2 C  SpO2: 94% 98%    Last Pain:  Vitals:   03/03/21 1945  TempSrc: Oral  PainSc: 0-No pain                 Effie Berkshire

## 2021-03-03 NOTE — Transfer of Care (Signed)
Immediate Anesthesia Transfer of Care Note  Patient: Robert Chaney  Procedure(s) Performed: ESOPHAGOGASTRODUODENOSCOPY (EGD) WITH PROPOFOL BIOPSY  Patient Location: PACU  Anesthesia Type:MAC  Level of Consciousness: sedated  Airway & Oxygen Therapy: Patient Spontanous Breathing and Patient connected to nasal cannula oxygen  Post-op Assessment: Report given to RN and Post -op Vital signs reviewed and stable  Post vital signs: Reviewed and stable  Last Vitals:  Vitals Value Taken Time  BP 91/40 03/03/21 1403  Temp    Pulse 50 03/03/21 1403  Resp 17 03/03/21 1403  SpO2 96 % 03/03/21 1403  Vitals shown include unvalidated device data.  Last Pain:  Vitals:   03/03/21 1332  TempSrc: Temporal  PainSc: 0-No pain         Complications: No notable events documented.
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
## 2021-03-04 DIAGNOSIS — I5033 Acute on chronic diastolic (congestive) heart failure: Secondary | ICD-10-CM | POA: Diagnosis not present

## 2021-03-04 DIAGNOSIS — J9601 Acute respiratory failure with hypoxia: Secondary | ICD-10-CM

## 2021-03-04 DIAGNOSIS — J9621 Acute and chronic respiratory failure with hypoxia: Secondary | ICD-10-CM | POA: Diagnosis not present

## 2021-03-04 LAB — GLUCOSE, CAPILLARY
Glucose-Capillary: 124 mg/dL — ABNORMAL HIGH (ref 70–99)
Glucose-Capillary: 178 mg/dL — ABNORMAL HIGH (ref 70–99)
Glucose-Capillary: 103 mg/dL — ABNORMAL HIGH (ref 70–99)
Glucose-Capillary: 191 mg/dL — ABNORMAL HIGH (ref 70–99)

## 2021-03-04 LAB — CBC WITH DIFFERENTIAL/PLATELET
Abs Immature Granulocytes: 0.02 10*3/uL (ref 0.00–0.07)
Basophils Absolute: 0 10*3/uL (ref 0.0–0.1)
Basophils Relative: 1 %
Eosinophils Absolute: 0.1 10*3/uL (ref 0.0–0.5)
Eosinophils Relative: 2 %
HCT: 31.2 % — ABNORMAL LOW (ref 39.0–52.0)
Hemoglobin: 9.5 g/dL — ABNORMAL LOW (ref 13.0–17.0)
Immature Granulocytes: 0 %
Lymphocytes Relative: 22 %
Lymphs Abs: 1.2 10*3/uL (ref 0.7–4.0)
MCH: 28.3 pg (ref 26.0–34.0)
MCHC: 30.4 g/dL (ref 30.0–36.0)
MCV: 92.9 fL (ref 80.0–100.0)
Monocytes Absolute: 0.5 10*3/uL (ref 0.1–1.0)
Monocytes Relative: 9 %
Neutro Abs: 3.6 10*3/uL (ref 1.7–7.7)
Neutrophils Relative %: 66 %
Platelets: 225 10*3/uL (ref 150–400)
RBC: 3.36 MIL/uL — ABNORMAL LOW (ref 4.22–5.81)
RDW: 15.9 % — ABNORMAL HIGH (ref 11.5–15.5)
WBC: 5.5 10*3/uL (ref 4.0–10.5)
nRBC: 0 % (ref 0.0–0.2)

## 2021-03-04 LAB — COMPREHENSIVE METABOLIC PANEL
ALT: 77 U/L — ABNORMAL HIGH (ref 0–44)
AST: 41 U/L (ref 15–41)
Albumin: 2.5 g/dL — ABNORMAL LOW (ref 3.5–5.0)
Alkaline Phosphatase: 60 U/L (ref 38–126)
Anion gap: 9 (ref 5–15)
BUN: 44 mg/dL — ABNORMAL HIGH (ref 8–23)
CO2: 26 mmol/L (ref 22–32)
Calcium: 8.5 mg/dL — ABNORMAL LOW (ref 8.9–10.3)
Chloride: 102 mmol/L (ref 98–111)
Creatinine, Ser: 2.56 mg/dL — ABNORMAL HIGH (ref 0.61–1.24)
GFR, Estimated: 26 mL/min — ABNORMAL LOW (ref >60)
Glucose, Bld: 122 mg/dL — ABNORMAL HIGH (ref 70–99)
Potassium: 4 mmol/L (ref 3.5–5.1)
Sodium: 137 mmol/L (ref 135–145)
Total Bilirubin: 0.7 mg/dL (ref 0.3–1.2)
Total Protein: 6.5 g/dL (ref 6.5–8.1)

## 2021-03-04 LAB — PROCALCITONIN: Procalcitonin: <0.1 ng/mL

## 2021-03-04 MED ORDER — FUROSEMIDE 10 MG/ML IJ SOLN
80.0000 mg | Freq: Two times a day (BID) | INTRAMUSCULAR | Status: DC
  Administered 2021-03-04: 80 mg via INTRAVENOUS
  Filled 2021-03-04: qty 8

## 2021-03-04 NOTE — Progress Notes (Signed)
NPO past midnight order placed for bronch tomorrow.  Julian Hy, DO 03/04/21 6:59 PM [facility]

## 2021-03-04 NOTE — Progress Notes (Signed)
Patient refused CPAP/BIPAP hs tonight. Patient in no distress at this time.
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
## 2021-03-04 NOTE — Plan of Care (Signed)
  Problem: Education: Goal: Knowledge of General Education information will improve Description Including pain rating scale, medication(s)/side effects and non-pharmacologic comfort measures Outcome: Progressing   Problem: Health Behavior/Discharge Planning: Goal: Ability to manage health-related needs will improve Outcome: Progressing   

## 2021-03-04 NOTE — Progress Notes (Signed)
PROGRESS NOTE        PATIENT DETAILS Name: Robert Chaney Age: 70 y.o. Sex: male Date of Birth: 10-16-1950 Admit Date: 02/28/2021 Admitting Physician Vianne Bulls, MD QIO:[id number], Christean Grief, MD  Brief Narrative: Patient is a 70 y.o. male with history of CKD stage IV, HOCM, HFpEF, IDDM, HTN, OSA on CPAP, persistent RML infiltrate on chest imaging, chronic hypoxic respiratory failure on 3-4 L of oxygen at home-presented with worsening shortness of breath, chills, dark stools and fatigue.  He was found to have acute on chronic hypoxic respiratory failure due to a combination of right middle lobe infiltrate/HFpEF with exacerbation and worsening of his baseline hemoglobin.  He was subsequently admitted to the hospitalist service.  See below for further details.  Significant events: 9/7>> admit for shortness of breath/fever/black stools-worsening hypoxemia-imaging with persistent right middle lobe infiltrate-on BiPAP-started on antibiotics/Lasix and PRBC transfusion.   9/8>> liberated off BiPAP-placed on O2 3-4 L  Significant studies: 8/9>> Echo: EF 70-75% 9/7>> CXR: Right midlung airspace disease worrisome for pneumonia.  Antimicrobial therapy: Vancomycin: 9/7>> 9/9 Cefepime: 9/7>> 9/9 Unasyn: 9/9>>  Microbiology data: 9/7>> COVID/influenza PCR: Negative 9/7>> blood culture: No growth  Procedures : EGD done 03/03/21 - mild gastritis.  Consults: Cardiology, PCCM, GI  DVT Prophylaxis : Place and maintain sequential compression device Start: 03/03/21 0724 SCDs Start: 02/28/21 2322   Subjective:  Patient in bed, appears comfortable, denies any headache, no fever, no chest pain or pressure, no shortness of breath , no abdominal pain. No new focal weakness.   Assessment/Plan:  Acute on chronic hypoxic respiratory failure: Secondary to HFpEF exacerbation and postobstructive pneumonia.  Initially on BiPAP-liberated of the BiPAP at 9/8.  He is back on his usual  regimen of 3-4 L of oxygen.  Continue Lasix-and empiric antimicrobial therapy.    Persistent right middle lobe infiltrate likely postobstructive pneumonia: Suspicion for a endobronchial lesion -PCCM planning on bronchoscopy early next week.  Stop vancomycin/cefepime-switched to Unasyn.  Serum Quanteferon levels negative.  HFpEF with exacerbation: Improved-on diuretics-cardiology following.  Need to be cautious with preload reduction given history of HOCM.    ?  Upper GI bleed: Apparently has a black stools for the past several weeks-no BM since admission.  Since his respiratory status is stabilized-have consulted GI.  Continue PPI. EGD done 03/03/21 - mild gastritis.  Elevated troponins: Probably related demand ischemia in the setting of anemia/respiratory failure.  Appreciate cardiology input.  Due to concern for GI bleeding/severity of anemia-hold off on antiplatelets for now.  HOCM: Watch closely-minimize preload reduction is much as possible.  Resume beta-blocker.  Anemia: Multifactorial-has anemia related to CKD at baseline-question of GI bleeding and acute blood loss as well.  Hemoglobin stable after 1 unit of PRBC on admission-continue to watch closely.    HTN: BP much better after resumption of his usual medications-continue amlodipine/Coreg/clonidine, adjusted further 03/03/21.  CKD stage IV: Creatinine close to baseline-avoid nephrotoxic agents-follow.  History of renal artery stenosis  OSA: On CPAP nightly  DM-2 (A1c 5.8 on 7/22): CBG low am 03/03/21, Insulin adjusted, will monitor   Recent Labs    03/03/21 1655 03/03/21 2105 03/04/21 0622  GLUCAP 203* 158* 124*     Diet: Diet Order             Diet heart healthy/carb modified Room service appropriate? Yes; Fluid consistency: Thin  Diet  effective now                    Code Status: Full code   Family Communication: None at bedside  Disposition Plan: Status is: Inpatient  Remains inpatient appropriate  because:Inpatient level of care appropriate due to severity of illness  Dispo: The patient is from: Home              Anticipated d/c is to: Home              Patient currently is not medically stable to d/c.   Difficult to place patient No   Barriers to Discharge: On BiPAP-on IV antibiotics/IV Lasix-not yet stable for discharge.  Antimicrobial agents: Anti-infectives (From admission, onward)    Start     Dose/Rate Route Frequency Ordered Stop   03/02/21 1400  Ampicillin-Sulbactam (UNASYN) 3 g in sodium chloride 0.9 % 100 mL IVPB        3 g 200 mL/hr over 30 Minutes Intravenous Every 12 hours 03/02/21 1314     03/01/21 2300  vancomycin (VANCOREADY) IVPB 750 mg/150 mL  Status:  Discontinued        750 mg 150 mL/hr over 60 Minutes Intravenous Every 24 hours 02/28/21 2347 03/02/21 1258   03/01/21 0000  vancomycin (VANCOREADY) IVPB 1500 mg/300 mL        1,500 mg 150 mL/hr over 120 Minutes Intravenous  Once 02/28/21 2347 03/01/21 0215   02/28/21 2345  ceFEPIme (MAXIPIME) 2 g in sodium chloride 0.9 % 100 mL IVPB  Status:  Discontinued       Note to Pharmacy: Cefepime 2 g IV q24h for CrCl < 30 mL/min   2 g 200 mL/hr over 30 Minutes Intravenous Daily at bedtime 02/28/21 2326 03/02/21 1258   02/28/21 2230  doxycycline (VIBRAMYCIN) 100 mg in sodium chloride 0.9 % 250 mL IVPB        100 mg 125 mL/hr over 120 Minutes Intravenous  Once 02/28/21 2219 03/01/21 0050        Time spent:35 minutes-Greater than 50% of this time was spent in counseling, explanation of diagnosis, planning of further management, and coordination of care.  MEDICATIONS: Scheduled Meds:  amLODipine  10 mg Oral Daily   atorvastatin  20 mg Oral Daily   carvedilol  12.5 mg Oral BID   cloNIDine  0.2 mg Oral BID   furosemide  40 mg Intravenous BID   insulin aspart  0-9 Units Subcutaneous TID WC   insulin aspart  2 Units Subcutaneous TID WC   insulin glargine-yfgn  12 Units Subcutaneous QHS   pantoprazole (PROTONIX)  IV  40 mg Intravenous Q12H   pneumococcal 23 valent vaccine  0.5 mL Intramuscular Tomorrow-1000   Continuous Infusions:  ampicillin-sulbactam (UNASYN) IV 3 g (03/04/21 0255)   PRN Meds:.acetaminophen **OR** [DISCONTINUED] acetaminophen, ondansetron (ZOFRAN) IV   PHYSICAL EXAM: Vital signs: Vitals:   03/03/21 1945 03/04/21 0000 03/04/21 0400 03/04/21 0800  BP: 134/68 (!) 154/71 (!) 158/72 (!) 155/73  Pulse: 60 (!) 56 (!) 59 65  Resp: 15 13 17 18   Temp: (!) 97.2 F (36.2 C) 97.9 F (36.6 C) (!) 97.2 F (36.2 C)   TempSrc: Oral Oral Oral   SpO2: 98% 99% 97% 95%  Weight:      Height:       Filed Weights   03/01/21 1932 03/02/21 0627 03/03/21 0349  Weight: 73.8 kg 71.5 kg 71.9 kg   Body mass index is 24.12 kg/m.  Exam  Awake Alert, No new F.N deficits, Normal affect .AT,PERRAL Supple Neck,No JVD, No cervical lymphadenopathy appriciated.  Symmetrical Chest wall movement, Good air movement bilaterally, CTAB RRR,No Gallops, Rubs or new Murmurs, No Parasternal Heave +ve B.Sounds, Abd Soft, No tenderness, No organomegaly appriciated, No rebound - guarding or rigidity. No Cyanosis, Clubbing or edema, No new Rash or bruise   I have personally reviewed following labs and imaging studies  LABORATORY DATA:  Recent Labs  Lab 02/28/21 2042 02/28/21 2051 03/01/21 0845 03/01/21 2027 03/02/21 0540 03/03/21 0036 03/04/21 0600  WBC 4.4  --  4.3  --  5.8 5.8 5.5  HGB 6.7*   < > 9.3* 9.6* 8.1* 7.9* 9.5*  HCT 22.7*   < > 31.9* 31.9* 27.0* 26.1* 31.2*  PLT 189  --  225  --  196 186 225  MCV 95.0  --  95.2  --  93.4 92.6 92.9  MCH 28.0  --  27.8  --  28.0 28.0 28.3  MCHC 29.5*  --  29.2*  --  30.0 30.3 30.4  RDW 16.4*  --  16.1*  --  16.3* 16.1* 15.9*  LYMPHSABS 1.2  --   --   --   --   --  1.2  MONOABS 0.5  --   --   --   --   --  0.5  EOSABS 0.0  --   --   --   --   --  0.1  BASOSABS 0.0  --   --   --   --   --  0.0   < > = values in this interval not displayed.     Recent Labs  Lab 02/28/21 2042 02/28/21 2051 03/01/21 2027 03/02/21 0540 03/03/21 0036 03/04/21 0600  NA 137 142  --  139 137 137  K 4.6 4.7  --  4.1 3.8 4.0  CL 108  --   --  107 102 102  CO2 24  --   --  25 27 26   GLUCOSE 280*  --   --  81 92 122*  BUN 41*  --   --  44* 46* 44*  CREATININE 2.93*  --   --  2.83* 2.70* 2.56*  CALCIUM 8.3*  --   --  8.5* 8.5* 8.5*  AST 74*  --   --  34 32 41  ALT 74*  --   --  65* 66* 77*  ALKPHOS 58  --   --  59 53 60  BILITOT 0.7  --   --  0.8 0.9 0.7  ALBUMIN 2.4*  --   --  2.2* 2.3* 2.5*  PROCALCITON  --   --  0.24 0.21  --   --   LATICACIDVEN 1.1  --   --   --   --   --   INR 1.2  --   --   --   --   --   BNP 2,327.5*  --   --  1,340.6*  --   --      RADIOLOGY STUDIES/RESULTS: No results found.   LOS: 4 days   Signature  Lala Lund M.D on 03/04/2021 at 10:06 AM   -  To page go to [email or web address]    03/04/2021, 10:06 AM

## 2021-03-05 ENCOUNTER — Inpatient Hospital Stay (HOSPITAL_COMMUNITY): Payer: PPO

## 2021-03-05 ENCOUNTER — Inpatient Hospital Stay (HOSPITAL_COMMUNITY): Payer: PPO | Admitting: Anesthesiology

## 2021-03-05 ENCOUNTER — Encounter (HOSPITAL_COMMUNITY): Payer: Self-pay | Admitting: Family Medicine

## 2021-03-05 ENCOUNTER — Encounter (HOSPITAL_COMMUNITY)
Admission: EM | Disposition: A | Discharge status: Home Health | Payer: Self-pay | Source: Home / Self Care | Attending: Internal Medicine

## 2021-03-05 DIAGNOSIS — J9621 Acute and chronic respiratory failure with hypoxia: Secondary | ICD-10-CM | POA: Diagnosis not present

## 2021-03-05 HISTORY — PX: VIDEO BRONCHOSCOPY: SHX5072

## 2021-03-05 HISTORY — PX: BRONCHIAL BRUSHINGS: SHX5108

## 2021-03-05 HISTORY — PX: BRONCHIAL WASHINGS: SHX5105

## 2021-03-05 HISTORY — PX: BRONCHIAL BIOPSY: SHX5109

## 2021-03-05 HISTORY — PX: HEMOSTASIS CONTROL: SHX6838

## 2021-03-05 LAB — CBC WITH DIFFERENTIAL/PLATELET
Abs Immature Granulocytes: 0.01 10*3/uL (ref 0.00–0.07)
Basophils Absolute: 0 10*3/uL (ref 0.0–0.1)
Basophils Relative: 0 %
Eosinophils Absolute: 0.1 10*3/uL (ref 0.0–0.5)
Eosinophils Relative: 2 %
HCT: 25.7 % — ABNORMAL LOW (ref 39.0–52.0)
Hemoglobin: 7.9 g/dL — ABNORMAL LOW (ref 13.0–17.0)
Immature Granulocytes: 0 %
Lymphocytes Relative: 24 %
Lymphs Abs: 1.2 10*3/uL (ref 0.7–4.0)
MCH: 28.3 pg (ref 26.0–34.0)
MCHC: 30.7 g/dL (ref 30.0–36.0)
MCV: 92.1 fL (ref 80.0–100.0)
Monocytes Absolute: 0.6 10*3/uL (ref 0.1–1.0)
Monocytes Relative: 12 %
Neutro Abs: 3.2 10*3/uL (ref 1.7–7.7)
Neutrophils Relative %: 62 %
Platelets: 188 10*3/uL (ref 150–400)
RBC: 2.79 MIL/uL — ABNORMAL LOW (ref 4.22–5.81)
RDW: 15.8 % — ABNORMAL HIGH (ref 11.5–15.5)
WBC: 5.1 10*3/uL (ref 4.0–10.5)
nRBC: 0 % (ref 0.0–0.2)

## 2021-03-05 LAB — CULTURE, BLOOD (ROUTINE X 2)
Culture: NO GROWTH
Culture: NO GROWTH
Special Requests: ADEQUATE

## 2021-03-05 LAB — COMPREHENSIVE METABOLIC PANEL
ALT: 64 U/L — ABNORMAL HIGH (ref 0–44)
AST: 33 U/L (ref 15–41)
Albumin: 2.2 g/dL — ABNORMAL LOW (ref 3.5–5.0)
Alkaline Phosphatase: 61 U/L (ref 38–126)
Anion gap: 6 (ref 5–15)
BUN: 46 mg/dL — ABNORMAL HIGH (ref 8–23)
CO2: 29 mmol/L (ref 22–32)
Calcium: 8.2 mg/dL — ABNORMAL LOW (ref 8.9–10.3)
Chloride: 101 mmol/L (ref 98–111)
Creatinine, Ser: 2.96 mg/dL — ABNORMAL HIGH (ref 0.61–1.24)
GFR, Estimated: 22 mL/min — ABNORMAL LOW (ref >60)
Glucose, Bld: 192 mg/dL — ABNORMAL HIGH (ref 70–99)
Potassium: 4 mmol/L (ref 3.5–5.1)
Sodium: 136 mmol/L (ref 135–145)
Total Bilirubin: 0.8 mg/dL (ref 0.3–1.2)
Total Protein: 5.7 g/dL — ABNORMAL LOW (ref 6.5–8.1)

## 2021-03-05 LAB — GLUCOSE, CAPILLARY
Glucose-Capillary: 93 mg/dL (ref 70–99)
Glucose-Capillary: 69 mg/dL — ABNORMAL LOW (ref 70–99)
Glucose-Capillary: 69 mg/dL — ABNORMAL LOW (ref 70–99)
Glucose-Capillary: 79 mg/dL (ref 70–99)
Glucose-Capillary: 100 mg/dL — ABNORMAL HIGH (ref 70–99)
Glucose-Capillary: 292 mg/dL — ABNORMAL HIGH (ref 70–99)

## 2021-03-05 LAB — PROCALCITONIN: Procalcitonin: <0.1 ng/mL

## 2021-03-05 SURGERY — BRONCHOSCOPY, WITH FLUOROSCOPY
Anesthesia: General

## 2021-03-05 MED ORDER — HYDRALAZINE HCL 50 MG PO TABS
100.0000 mg | ORAL_TABLET | Freq: Three times a day (TID) | ORAL | Status: DC
  Administered 2021-03-05 – 2021-03-06 (×3): 100 mg via ORAL
  Filled 2021-03-05 (×3): qty 2

## 2021-03-05 MED ORDER — PHENYLEPHRINE HCL (PRESSORS) 10 MG/ML IV SOLN
INTRAVENOUS | Status: DC | PRN
  Administered 2021-03-05: 80 ug via INTRAVENOUS

## 2021-03-05 MED ORDER — DEXAMETHASONE SODIUM PHOSPHATE 10 MG/ML IJ SOLN
INTRAMUSCULAR | Status: DC | PRN
  Administered 2021-03-05: 10 mg via INTRAVENOUS

## 2021-03-05 MED ORDER — PROPOFOL 10 MG/ML IV BOLUS
INTRAVENOUS | Status: DC | PRN
  Administered 2021-03-05: 140 mg via INTRAVENOUS

## 2021-03-05 MED ORDER — LIDOCAINE 2% (20 MG/ML) 5 ML SYRINGE
INTRAMUSCULAR | Status: DC | PRN
  Administered 2021-03-05: 20 mg via INTRAVENOUS

## 2021-03-05 MED ORDER — SUCCINYLCHOLINE CHLORIDE 200 MG/10ML IV SOSY
PREFILLED_SYRINGE | INTRAVENOUS | Status: DC | PRN
  Administered 2021-03-05: 100 mg via INTRAVENOUS

## 2021-03-05 MED ORDER — SODIUM CHLORIDE 0.9 % IV SOLN
INTRAVENOUS | Status: AC | PRN
  Administered 2021-03-05: 20 mL via INTRAMUSCULAR

## 2021-03-05 MED ORDER — EPHEDRINE SULFATE-NACL 50-0.9 MG/10ML-% IV SOSY
PREFILLED_SYRINGE | INTRAVENOUS | Status: DC | PRN
  Administered 2021-03-05: 10 mg via INTRAVENOUS

## 2021-03-05 MED ORDER — PHENYLEPHRINE HCL-NACL 20-0.9 MG/250ML-% IV SOLN
INTRAVENOUS | Status: DC | PRN
  Administered 2021-03-05: 50 ug/min via INTRAVENOUS

## 2021-03-05 MED ORDER — SODIUM CHLORIDE (PF) 0.9 % IJ SOLN
PREFILLED_SYRINGE | INTRAMUSCULAR | Status: DC | PRN
  Administered 2021-03-05: 4 mL

## 2021-03-05 MED ORDER — FENTANYL CITRATE (PF) 100 MCG/2ML IJ SOLN
INTRAMUSCULAR | Status: DC | PRN
  Administered 2021-03-05: 100 ug via INTRAVENOUS

## 2021-03-05 MED ORDER — ROCURONIUM BROMIDE 10 MG/ML (PF) SYRINGE
PREFILLED_SYRINGE | INTRAVENOUS | Status: DC | PRN
  Administered 2021-03-05: 20 mg via INTRAVENOUS

## 2021-03-05 MED ORDER — SUGAMMADEX SODIUM 200 MG/2ML IV SOLN
INTRAVENOUS | Status: DC | PRN
  Administered 2021-03-05: 150 mg via INTRAVENOUS
  Administered 2021-03-05: 50 mg via INTRAVENOUS

## 2021-03-05 MED ORDER — LACTATED RINGERS IV SOLN: INTRAVENOUS | Status: DC | PRN

## 2021-03-05 MED ORDER — TORSEMIDE 20 MG PO TABS
40.0000 mg | ORAL_TABLET | Freq: Every day | ORAL | Status: DC
  Administered 2021-03-05 – 2021-03-06 (×2): 40 mg via ORAL
  Filled 2021-03-05 (×2): qty 2

## 2021-03-05 NOTE — Plan of Care (Signed)
  Problem: Clinical Measurements: Goal: Diagnostic test results will improve Outcome: Not Progressing Goal: Respiratory complications will improve Outcome: Not Progressing

## 2021-03-05 NOTE — Anesthesia Procedure Notes (Signed)
Procedure Name: Intubation Date/Time: 03/05/2021 1:05 PM Performed by: Lavell Luster, CRNA Pre-anesthesia Checklist: Emergency Drugs available, Patient identified, Suction available, Patient being monitored and Timeout performed Patient Re-evaluated:Patient Re-evaluated prior to induction Oxygen Delivery Method: Circle system utilized Preoxygenation: Pre-oxygenation with 100% oxygen Induction Type: IV induction Ventilation: Mask ventilation without difficulty Laryngoscope Size: Mac, 4 and Glidescope Grade View: Grade I Tube type: Oral Tube size: 8.5 mm Number of attempts: 1 Airway Equipment and Method: Stylet Placement Confirmation: breath sounds checked- equal and bilateral, positive ETCO2 and ETT inserted through vocal cords under direct vision Secured at: 23 cm Tube secured with: Tape Dental Injury: Teeth and Oropharynx as per pre-operative assessment

## 2021-03-05 NOTE — Procedures (Signed)
Bronchoscopy Procedure Note  ATA PECHA  [id number]  Aug 09, 1950  Date:03/05/21  Time:2:19 PM   Provider Performing:Quatavious Rossa C Jaleyah Longhi   Procedure(s):  Flexible Bronchoscopy [phone]), Flexible bronchoscopy with brushing [phone]), Flexible bronchoscopy with bronchial alveolar lavage [phone]), and Transbronchial lung biopsy, single lobe (92426)  Indication(s) Persistent pneumonitis  Consent Risks of the procedure as well as the alternatives and risks of each were explained to the patient and/or caregiver.  Consent for the procedure was obtained and is signed in the bedside chart  Anesthesia General   Time Out Verified patient identification, verified procedure, site/side was marked, verified correct patient position, special equipment/implants available, medications/allergies/relevant history reviewed, required imaging and test results available.   Sterile Technique Usual hand hygiene, masks, gowns, and gloves were used   Procedure Description Bronchoscope advanced through endotracheal tube and into airway.  Airways were examined down to subsegmental level with findings noted below.   Following diagnostic evaluation, BAL(s) performed in RUL with normal saline and return of clear fluid, Brushing(s) performed in RUL, and Transbronchial biopsy(s) performed under fluoroscopic guidance in RUL  Findings:  - No endobronchial lesions, no secretions  Complications/Tolerance Post biopsy bleeding RUL ~50-75cc controlled with iced saline and 1:10000 epi Chest X-ray is needed post procedure.   EBL 75 cc   Specimen(s) RUL Posterior subsegment BAL/Brushing/Tbbx all sent for culture and cyto  29 seconds fluoro time

## 2021-03-05 NOTE — TOC Initial Note (Signed)
Transition of Care [facility]) - Initial/Assessment Note    Patient Details  Name: Robert Chaney MRN: [id number] Date of Birth: [birth year]  [facility] CM/SW Contact:    Zenon Mayo, RN Phone Number: 03/05/2021, 3:47 PM  Clinical Narrative:                 Patient from home alone, NCM spoke with patient, he states he will have transport at dc.  He weighs himself daily, he has bp cuff to check his blood pressure, he does ok with his diet as far as low sodium.  He is indep and does not need any DME at home.  TOC will continue to follow for dc needs.  Expected Discharge Plan: Home/Self Care Barriers to Discharge: Continued Medical Work up   Patient Goals and CMS Choice Patient states their goals for this hospitalization and ongoing recovery are:: return home   Choice offered to / list presented to : NA  Expected Discharge Plan and Services Expected Discharge Plan: Home/Self Care   Discharge Planning Services: CM Consult   Living arrangements for the past 2 months: Single Family Home                   DME Agency: NA       HH Arranged: NA          Prior Living Arrangements/Services Living arrangements for the past 2 months: Single Family Home Lives with:: Spouse Patient language and need for interpreter reviewed:: Yes Do you feel safe going back to the place where you live?: Yes      Need for Family Participation in Patient Care: No (Comment) Care giver support system in place?: No (comment)   Criminal Activity/Legal Involvement Pertinent to Current Situation/Hospitalization: No - Comment as needed  Activities of Daily Living Home Assistive Devices/Equipment: None ADL Screening (condition at time of admission) Patient's cognitive ability adequate to safely complete daily activities?: Yes Is the patient deaf or have difficulty hearing?: No Does the patient have difficulty seeing, even when wearing glasses/contacts?: No Does the patient have  difficulty concentrating, remembering, or making decisions?: No Patient able to express need for assistance with ADLs?: No Does the patient have difficulty dressing or bathing?: No Independently performs ADLs?: Yes (appropriate for developmental age) Does the patient have difficulty walking or climbing stairs?: No Weakness of Legs: None Weakness of Arms/Hands: None  Permission Sought/Granted                  Emotional Assessment   Attitude/Demeanor/Rapport: Engaged Affect (typically observed): Appropriate Orientation: : Oriented to Self, Oriented to Place, Oriented to  Time, Oriented to Situation Alcohol / Substance Use: Not Applicable Psych Involvement: No (comment)  Admission diagnosis:  NSTEMI (non-ST elevated myocardial infarction) ([place]) [I21.4] Acute respiratory failure with hypoxia ([place]) [J96.01] Acute and chronic respiratory failure with hypoxia ([facility]) [J96.21] Anemia, unspecified type [D64.9] Community acquired pneumonia, unspecified laterality [J18.9] Acute on chronic congestive heart failure, unspecified heart failure type ([place]) [I50.9] Patient Active Problem List   Diagnosis Date Noted   Acute and chronic respiratory failure with hypoxia ([place]) 02/28/2021   Melena 02/28/2021   Symptomatic anemia 02/28/2021   Syncope 01/30/2021   Acute on chronic respiratory failure with hypoxemia ([place]) 01/30/2021   Multifocal pneumonia 01/30/2021   Acute on chronic respiratory failure with hypoxia ([place]) 05/03/2020   Hypertensive urgency 02/18/2020   CHF exacerbation ([place]) 01/11/2020   Acute respiratory failure ([place]) 01/11/2020   Type 2 diabetes mellitus  with retinopathy, with long-term current use of insulin ([place]) 07/16/2019   Type 2 diabetes mellitus with stage 3a chronic kidney disease, with long-term current use of insulin ([place]) 07/16/2019   Type 2 diabetes mellitus with diabetic polyneuropathy, with long-term current use of insulin ([place]) 07/16/2019   OSA (obstructive sleep  apnea) 03/30/2019   Hyperglycemia 07/31/2017   Near syncope 07/31/2017   Acute-on-chronic kidney injury ([place]) 07/31/2017   AKI (acute kidney injury) ([place]) 06/09/2017   Hypertensive heart disease 11/07/2016   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[place]) 07/26/2015   Dyslipidemia 05/19/2013   Diabetic hyperosmolar non-ketotic state ([place]) 07/19/2011   DM (diabetes mellitus) with complications ([place]) 76/80/8811   Stage 4 chronic kidney disease ([place]) 07/19/2011   Elevated troponin 07/19/2011   Prolonged QT interval 07/19/2011   Hyperkalemia 07/19/2011   Volume depletion 07/19/2011   Acute on chronic diastolic CHF (congestive heart failure) ([place]) 07/19/2011   Essential hypertension 07/19/2011   Elevated CPK 07/19/2011   Hypercholesterolemia 02/20/2011   Benign hypertensive heart disease without heart failure 02/20/2011   PCP:  [name], MD Pharmacy:   [facility], [place] [address] Phone: [phone] Fax: [phone]  [facility] [place], [place] [address] [place] [address] Phone: [phone] Fax: [phone]  [facility] (New Address) - [place], [place] [address] [place] AT Previously: [address], [place] [address] [place] [address] Phone: [phone] Fax: [phone]     Social Determinants of Health ([place]) Interventions    Readmission Risk Interventions Readmission Risk Prevention Plan 03/05/2021 05/12/2020  Transportation Screening Complete Complete  PCP or Specialist Appt within 3-5 Days Complete Complete  HRI or Home Care Consult Complete Complete  Social Work Consult for [place] Planning/Counseling Complete Complete  Palliative Care Screening Not Applicable Not Applicable  Medication Review Press photographer) Complete  Complete  Some recent data might be hidden

## 2021-03-05 NOTE — Progress Notes (Signed)
PROGRESS NOTE        PATIENT DETAILS Name: Robert Chaney Age: 70 y.o. Sex: male Date of Birth: 1950/08/22 Admit Date: 02/28/2021 Admitting Physician Vianne Bulls, MD URK:[id number], Christean Grief, MD  Brief Narrative: Patient is a 70 y.o. male with history of CKD stage IV, HOCM, HFpEF, IDDM, HTN, OSA on CPAP, persistent RML infiltrate on chest imaging, chronic hypoxic respiratory failure on 3-4 L of oxygen at home-presented with worsening shortness of breath, chills, dark stools and fatigue.  He was found to have acute on chronic hypoxic respiratory failure due to a combination of right middle lobe infiltrate/HFpEF with exacerbation and worsening of his baseline hemoglobin.  He was subsequently admitted to the hospitalist service.  See below for further details.  Significant events: 9/7>> admit for shortness of breath/fever/black stools-worsening hypoxemia-imaging with persistent right middle lobe infiltrate-on BiPAP-started on antibiotics/Lasix and PRBC transfusion.   9/8>> liberated off BiPAP-placed on O2 3-4 L  Significant studies: 8/9>> Echo: EF 70-75% 9/7>> CXR: Right midlung airspace disease worrisome for pneumonia.  Antimicrobial therapy: Vancomycin: 9/7>> 9/9 Cefepime: 9/7>> 9/9 Unasyn: 9/9>>  Microbiology data: 9/7>> COVID/influenza PCR: Negative 9/7>> blood culture: No growth  Procedures : EGD done 03/03/21 - mild gastritis.  Consults: Cardiology, PCCM, GI  DVT Prophylaxis : Place and maintain sequential compression device Start: 03/03/21 0724 SCDs Start: 02/28/21 2322   Subjective:  Patient in bed, appears comfortable, denies any headache, no fever, no chest pain or pressure, no shortness of breath , no abdominal pain. No new focal weakness.    Assessment/Plan:  Acute on chronic hypoxic respiratory failure: Secondary to HFpEF exacerbation and postobstructive pneumonia.  Initially on BiPAP-liberated of the BiPAP at 9/8.  He is back on his usual  regimen of 3-4 L of oxygen.  Continue Lasix-and empiric antimicrobial therapy.    Persistent right middle lobe infiltrate likely postobstructive pneumonia: Suspicion for a endobronchial lesion -PCCM planning on bronchoscopy later today, DW Dr Tamala Julian 03/05/21.  Stop vancomycin/cefepime-switched to Unasyn.  Serum Quanteferon levels negative.  HFpEF with exacerbation: Improved-on diuretics-cardiology following.  Need to be cautious with preload reduction given history of HOCM.    ?  Upper GI bleed: Apparently has a black stools for the past several weeks-no BM since admission.  Since his respiratory status is stabilized-have consulted GI.  Continue PPI. EGD done 03/03/21 - mild gastritis.  Elevated troponins: Probably related demand ischemia in the setting of anemia/respiratory failure.  Appreciate cardiology input.  Due to concern for GI bleeding/severity of anemia-hold off on antiplatelets for now.  HOCM: Watch closely-minimize preload reduction is much as possible.  Resume beta-blocker.  Anemia: Multifactorial-has anemia related to CKD at baseline-question of GI bleeding and acute blood loss as well.  Hemoglobin stable after 1 unit of PRBC on admission-continue to watch closely.    HTN: BP much better after resumption of his usual medications-continue amlodipine/Coreg/clonidine, adjusted further 03/03/21.  CKD stage IV: Creatinine close to baseline-avoid nephrotoxic agents-follow.  History of renal artery stenosis  OSA: On CPAP nightly  DM-2 (A1c 5.8 on 7/22): CBG low am 03/03/21, Insulin adjusted, will monitor   Recent Labs    03/04/21 1538 03/04/21 2046 03/05/21 0604  GLUCAP 103* 191* 93     Diet: Diet Order             Diet NPO time specified  Diet effective midnight  Code Status: Full code   Family Communication:  None at bedside  Disposition Plan: Status is: Inpatient  Remains inpatient appropriate because:Inpatient level of care appropriate due  to severity of illness  Dispo: The patient is from: Home              Anticipated d/c is to: Home              Patient currently is not medically stable to d/c.   Difficult to place patient No   Barriers to Discharge: On BiPAP-on IV antibiotics/IV Lasix-not yet stable for discharge.  Antimicrobial agents: Anti-infectives (From admission, onward)    Start     Dose/Rate Route Frequency Ordered Stop   03/02/21 1400  Ampicillin-Sulbactam (UNASYN) 3 g in sodium chloride 0.9 % 100 mL IVPB  Status:  Discontinued        3 g 200 mL/hr over 30 Minutes Intravenous Every 12 hours 03/02/21 1314 03/05/21 0834   03/01/21 2300  vancomycin (VANCOREADY) IVPB 750 mg/150 mL  Status:  Discontinued        750 mg 150 mL/hr over 60 Minutes Intravenous Every 24 hours 02/28/21 2347 03/02/21 1258   03/01/21 0000  vancomycin (VANCOREADY) IVPB 1500 mg/300 mL        1,500 mg 150 mL/hr over 120 Minutes Intravenous  Once 02/28/21 2347 03/01/21 0215   02/28/21 2345  ceFEPIme (MAXIPIME) 2 g in sodium chloride 0.9 % 100 mL IVPB  Status:  Discontinued       Note to Pharmacy: Cefepime 2 g IV q24h for CrCl < 30 mL/min   2 g 200 mL/hr over 30 Minutes Intravenous Daily at bedtime 02/28/21 2326 03/02/21 1258   02/28/21 2230  doxycycline (VIBRAMYCIN) 100 mg in sodium chloride 0.9 % 250 mL IVPB        100 mg 125 mL/hr over 120 Minutes Intravenous  Once 02/28/21 2219 03/01/21 0050        Time spent:35 minutes-Greater than 50% of this time was spent in counseling, explanation of diagnosis, planning of further management, and coordination of care.  MEDICATIONS: Scheduled Meds:  amLODipine  10 mg Oral Daily   atorvastatin  20 mg Oral Daily   carvedilol  12.5 mg Oral BID   cloNIDine  0.2 mg Oral BID   hydrALAZINE  100 mg Oral Q8H   insulin aspart  0-9 Units Subcutaneous TID WC   insulin aspart  2 Units Subcutaneous TID WC   insulin glargine-yfgn  12 Units Subcutaneous QHS   pantoprazole (PROTONIX) IV  40 mg  Intravenous Q12H   pneumococcal 23 valent vaccine  0.5 mL Intramuscular Tomorrow-1000   torsemide  40 mg Oral Daily   Continuous Infusions:   PRN Meds:.acetaminophen **OR** [DISCONTINUED] acetaminophen, ondansetron (ZOFRAN) IV   PHYSICAL EXAM: Vital signs: Vitals:   03/04/21 1916 03/04/21 2341 03/05/21 0414 03/05/21 0744  BP: (!) 156/74 120/72 (!) 154/73 (!) 150/74  Pulse: 66 (!) 57 (!) 54 (!) 55  Resp: (!) 21 14 18 18   Temp: (!) 97.2 F (36.2 C) (!) 97.3 F (36.3 C) 97.6 F (36.4 C) 98.6 F (37 C)  TempSrc: Oral Oral Oral Oral  SpO2: 97% 96% 98% 98%  Weight:   71.3 kg   Height:       Filed Weights   03/02/21 0627 03/03/21 0349 03/05/21 0414  Weight: 71.5 kg 71.9 kg 71.3 kg   Body mass index is 23.9 kg/m.   Exam  Awake Alert, No new F.N deficits, Normal affect  [place].AT,PERRAL Supple Neck,No JVD, No cervical lymphadenopathy appriciated.  Symmetrical Chest wall movement, Good air movement bilaterally, CTAB RRR,No Gallops, Rubs or new Murmurs, No Parasternal Heave +ve B.Sounds, Abd Soft, No tenderness, No organomegaly appriciated, No rebound - guarding or rigidity. No Cyanosis, Clubbing or edema, No new Rash or bruise    I have personally reviewed following labs and imaging studies  LABORATORY DATA:  Recent Labs  Lab 02/28/21 2042 02/28/21 2051 03/01/21 0845 03/01/21 2027 03/02/21 0540 03/03/21 0036 03/04/21 0600 03/05/21 0043  WBC 4.4  --  4.3  --  5.8 5.8 5.5 5.1  HGB 6.7*   < > 9.3* 9.6* 8.1* 7.9* 9.5* 7.9*  HCT 22.7*   < > 31.9* 31.9* 27.0* 26.1* 31.2* 25.7*  PLT 189  --  225  --  196 186 225 188  MCV 95.0  --  95.2  --  93.4 92.6 92.9 92.1  MCH 28.0  --  27.8  --  28.0 28.0 28.3 28.3  MCHC 29.5*  --  29.2*  --  30.0 30.3 30.4 30.7  RDW 16.4*  --  16.1*  --  16.3* 16.1* 15.9* 15.8*  LYMPHSABS 1.2  --   --   --   --   --  1.2 1.2  MONOABS 0.5  --   --   --   --   --  0.5 0.6  EOSABS 0.0  --   --   --   --   --  0.1 0.1  BASOSABS 0.0  --   --   --    --   --  0.0 0.0   < > = values in this interval not displayed.    Recent Labs  Lab 02/28/21 2042 02/28/21 2051 03/01/21 2027 03/02/21 0540 03/03/21 0036 03/04/21 0600 03/04/21 0739 03/05/21 0043  NA 137 142  --  139 137 137  --  136  K 4.6 4.7  --  4.1 3.8 4.0  --  4.0  CL 108  --   --  107 102 102  --  101  CO2 24  --   --  25 27 26   --  29  GLUCOSE 280*  --   --  81 92 122*  --  192*  BUN 41*  --   --  44* 46* 44*  --  46*  CREATININE 2.93*  --   --  2.83* 2.70* 2.56*  --  2.96*  CALCIUM 8.3*  --   --  8.5* 8.5* 8.5*  --  8.2*  AST 74*  --   --  34 32 41  --  33  ALT 74*  --   --  65* 66* 77*  --  64*  ALKPHOS 58  --   --  59 53 60  --  61  BILITOT 0.7  --   --  0.8 0.9 0.7  --  0.8  ALBUMIN 2.4*  --   --  2.2* 2.3* 2.5*  --  2.2*  PROCALCITON  --   --  0.24 0.21  --   --  <0.10 <0.10  LATICACIDVEN 1.1  --   --   --   --   --   --   --   INR 1.2  --   --   --   --   --   --   --   BNP 2,327.5*  --   --  1,340.6*  --   --   --   --  RADIOLOGY STUDIES/RESULTS: No results found.   LOS: 5 days   Signature  Lala Lund M.D on 03/05/2021 at 10:30 AM   -  To page go to [email or web address]    03/05/2021, 10:30 AM
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
## 2021-03-05 NOTE — Anesthesia Preprocedure Evaluation (Signed)
Anesthesia Evaluation  Patient identified by MRN, date of birth, ID band Patient awake    Reviewed: Allergy & Precautions, NPO status , Patient's Chart, lab work & pertinent test results  Airway Mallampati: II  TM Distance: >3 FB Neck ROM: Full    Dental  (+) Teeth Intact, Dental Advisory Given   Pulmonary sleep apnea ,    breath sounds clear to auscultation       Cardiovascular hypertension, Pt. on medications and Pt. on home beta blockers +CHF   Rhythm:Regular Rate:Normal  Echo: 1. Peak outflow gradient velocity 3.35 m/s, peak gradient 45 mmHg. Left  ventricular ejection fraction, by estimation, is 70 to 75%. The left  ventricle has hyperdynamic function. The left ventricle has no regional  wall motion abnormalities. There is  severe left ventricular hypertrophy. Left ventricular diastolic parameters  are consistent with Grade I diastolic dysfunction (impaired relaxation).  2. Right ventricular systolic function is normal. The right ventricular  size is normal.  3. The mitral valve is normal in structure. No evidence of mitral valve  regurgitation. No evidence of mitral stenosis.  4. The aortic valve is normal in structure. Aortic valve regurgitation is  not visualized. No aortic stenosis is present.  5. The inferior vena cava is normal in size with greater than 50%  respiratory variability, suggesting right atrial pressure of 3 mmHg.    Neuro/Psych  Neuromuscular disease negative psych ROS   GI/Hepatic negative GI ROS,   Endo/Other  diabetes, Type 2, Insulin Dependent  Renal/GU CRF and Renal InsufficiencyRenal disease     Musculoskeletal negative musculoskeletal ROS (+)   Abdominal Normal abdominal exam  (+)   Peds  Hematology  (+) anemia ,   Anesthesia Other Findings   Reproductive/Obstetrics                             Anesthesia Physical  Anesthesia Plan  ASA:  3  Anesthesia Plan: General   Post-op Pain Management:    Induction: Intravenous  PONV Risk Score and Plan: 2 and Dexamethasone and Ondansetron  Airway Management Planned: Oral ETT  Additional Equipment: None  Intra-op Plan:   Post-operative Plan:   Informed Consent: I have reviewed the patients History and Physical, chart, labs and discussed the procedure including the risks, benefits and alternatives for the proposed anesthesia with the patient or authorized representative who has indicated his/her understanding and acceptance.   Patient has DNR.  Discussed DNR with patient and Suspend DNR.   Dental advisory given  Plan Discussed with: CRNA  Anesthesia Plan Comments:         Anesthesia Quick Evaluation

## 2021-03-05 NOTE — Transfer of Care (Signed)
Immediate Anesthesia Transfer of Care Note  Patient: Robert Chaney  Procedure(s) Performed: VIDEO BRONCHOSCOPY WITH FLUORO BRONCHIAL BIOPSIES BRONCHIAL BRUSHINGS BRONCHIAL WASHINGS HEMOSTASIS CONTROL  Patient Location: Endoscopy Unit  Anesthesia Type:General  Level of Consciousness: awake, alert  and sedated  Airway & Oxygen Therapy: Patient connected to face mask oxygen  Post-op Assessment: Post -op Vital signs reviewed and stable  Post vital signs: stable  Last Vitals:  Vitals Value Taken Time  BP 162/53 03/05/21 1400  Temp 36.4 C 03/05/21 1400  Pulse 56 03/05/21 1402  Resp 13 03/05/21 1402  SpO2 94 % 03/05/21 1402  Vitals shown include unvalidated device data.  Last Pain:  Vitals:   03/05/21 1400  TempSrc: Temporal  PainSc:          Complications: No notable events documented.

## 2021-03-05 NOTE — Progress Notes (Signed)
Heart Failure Navigator Progress Note  Assessed for Heart & Vascular TOC clinic readiness.  Patient does not meet criteria due to SCr remains > 2.5.   Navigator available for reassessment of patient.   Pricilla Holm, MSN, RN Heart Failure Nurse Navigator [phone]

## 2021-03-05 NOTE — Progress Notes (Addendum)
03/05/2021   I have seen and evaluated the patient for abnormal imaging.  S:  No events, breathing improved but still dyspneic with minimal exertion. No cough/fevers.  + chills.  O: Blood pressure (!) 150/74, pulse (!) 55, temperature 98.6 F (37 C), temperature source Oral, resp. rate 18, height 5\' 8"  (1.727 m), weight 71.3 kg, SpO2 98 %.  No distress R hyphema noted MMM, trachea midline Mild global anasarca Lung sounds diminished bilaterally, nonlabored breathing on 4L O2  Cr stable Pct neg Quant neg   A:  Acute on chronic hypoxemic respiratory failure improved some with diuresis in patient with CKD and HOCM Nonresolving pneumonia question atypical infection, inflammation, malignancy   P:  - DC abx - NPO - Bronch with Tbbx RUL posterior subsegment today 1PM; risks/benefits explained to pateint - May consider steroid trial for empiric COP tx - Continue diuresis per cardiology - Airborne can be Dc'd from my standpoint, no s/s of TB - Will follow with you  Erskine Emery MD [facility] Prefer epic messenger for cross cover needs If after hours, please call E-link

## 2021-03-05 NOTE — Progress Notes (Signed)
Pt refused CPAP tonight.

## 2021-03-05 NOTE — Progress Notes (Signed)
Cardiology Progress Note  Patient ID: BUD Chaney MRN: [id number] DOB: Apr 12, 1951 Date of Encounter: 03/05/2021  Primary Cardiologist: Skeet Latch, MD  Subjective   Chief Complaint: None.  HPI: Good diuresis yesterday.  Remains on home requirement of oxygen of 4 L.  Shortness of breath improving but not back to baseline.  ROS:  All other ROS reviewed and negative. Pertinent positives noted in the HPI.     Inpatient Medications  Scheduled Meds:  amLODipine  10 mg Oral Daily   atorvastatin  20 mg Oral Daily   carvedilol  12.5 mg Oral BID   cloNIDine  0.2 mg Oral BID   insulin aspart  0-9 Units Subcutaneous TID WC   insulin aspart  2 Units Subcutaneous TID WC   insulin glargine-yfgn  12 Units Subcutaneous QHS   pantoprazole (PROTONIX) IV  40 mg Intravenous Q12H   pneumococcal 23 valent vaccine  0.5 mL Intramuscular Tomorrow-1000   Continuous Infusions:  ampicillin-sulbactam (UNASYN) IV 3 g (03/05/21 0126)   PRN Meds: acetaminophen **OR** [DISCONTINUED] acetaminophen, ondansetron (ZOFRAN) IV   Vital Signs   Vitals:   03/04/21 1916 03/04/21 2341 03/05/21 0414 03/05/21 0744  BP: (!) 156/74 120/72 (!) 154/73 (!) 150/74  Pulse: 66 (!) 57 (!) 54 (!) 55  Resp: (!) 21 14 18 18   Temp: (!) 97.2 F (36.2 C) (!) 97.3 F (36.3 C) 97.6 F (36.4 C) 98.6 F (37 C)  TempSrc: Oral Oral Oral Oral  SpO2: 97% 96% 98% 98%  Weight:   71.3 kg   Height:        Intake/Output Summary (Last 24 hours) at 03/05/2021 0818 Last data filed at 03/05/2021 0414 Gross per 24 hour  Intake --  Output 2700 ml  Net -2700 ml   Last 3 Weights 03/05/2021 03/03/2021 03/02/2021  Weight (lbs) 157 lb 3 oz 158 lb 9.6 oz 157 lb 10.1 oz  Weight (kg) 71.3 kg 71.94 kg 71.5 kg      Telemetry  Overnight telemetry shows sinus bradycardia heart rate in the 50s, which I personally reviewed.   ECG  The most recent ECG shows sinus bradycardia heart rate 60, LVH with repolarization abnormality, which I  personally reviewed.   Physical Exam   Vitals:   03/04/21 1916 03/04/21 2341 03/05/21 0414 03/05/21 0744  BP: (!) 156/74 120/72 (!) 154/73 (!) 150/74  Pulse: 66 (!) 57 (!) 54 (!) 55  Resp: (!) 21 14 18 18   Temp: (!) 97.2 F (36.2 C) (!) 97.3 F (36.3 C) 97.6 F (36.4 C) 98.6 F (37 C)  TempSrc: Oral Oral Oral Oral  SpO2: 97% 96% 98% 98%  Weight:   71.3 kg   Height:        Intake/Output Summary (Last 24 hours) at 03/05/2021 0818 Last data filed at 03/05/2021 0414 Gross per 24 hour  Intake --  Output 2700 ml  Net -2700 ml    Last 3 Weights 03/05/2021 03/03/2021 03/02/2021  Weight (lbs) 157 lb 3 oz 158 lb 9.6 oz 157 lb 10.1 oz  Weight (kg) 71.3 kg 71.94 kg 71.5 kg    Body mass index is 23.9 kg/m.   General: Well nourished, well developed, in no acute distress Head: Atraumatic, normal size  Eyes: PEERLA, EOMI  Neck: Supple, no JVD Endocrine: No thryomegaly Cardiac: Normal S1, S2; RRR; 2 out of 6 holosystolic murmur Lungs: Diminished breath sounds at the lung bases Abd: Soft, nontender, no hepatomegaly  Ext: No edema, pulses 2+ Musculoskeletal: No deformities, BUE  and BLE strength normal and equal Skin: Warm and dry, no rashes   Neuro: Alert and oriented to person, place, time, and situation, CNII-XII grossly intact, no focal deficits  Psych: Normal mood and affect   Labs  High Sensitivity Troponin:   Recent Labs  Lab 02/28/21 2042 02/28/21 2249  TROPONINIHS 545* 546*     Cardiac EnzymesNo results for input(s): TROPONINI in the last 168 hours. No results for input(s): TROPIPOC in the last 168 hours.  Chemistry Recent Labs  Lab 03/03/21 0036 03/04/21 0600 03/05/21 0043  NA 137 137 136  K 3.8 4.0 4.0  CL 102 102 101  CO2 27 26 29   GLUCOSE 92 122* 192*  BUN 46* 44* 46*  CREATININE 2.70* 2.56* 2.96*  CALCIUM 8.5* 8.5* 8.2*  PROT 5.9* 6.5 5.7*  ALBUMIN 2.3* 2.5* 2.2*  AST 32 41 33  ALT 66* 77* 64*  ALKPHOS 53 60 61  BILITOT 0.9 0.7 0.8  GFRNONAA 24* 26* 22*   ANIONGAP 8 9 6     Hematology Recent Labs  Lab 03/03/21 0036 03/04/21 0600 03/05/21 0043  WBC 5.8 5.5 5.1  RBC 2.82* 3.36* 2.79*  HGB 7.9* 9.5* 7.9*  HCT 26.1* 31.2* 25.7*  MCV 92.6 92.9 92.1  MCH 28.0 28.3 28.3  MCHC 30.3 30.4 30.7  RDW 16.1* 15.9* 15.8*  PLT 186 225 188   BNP Recent Labs  Lab 02/28/21 2042 03/02/21 0540  BNP 2,327.5* 1,340.6*    DDimer No results for input(s): DDIMER in the last 168 hours.   Radiology  No results found.  Cardiac Studies  TTE 01/30/2021  1. Peak outflow gradient velocity 3.35 m/s, peak gradient 45 mmHg. Left  ventricular ejection fraction, by estimation, is 70 to 75%. The left  ventricle has hyperdynamic function. The left ventricle has no regional  wall motion abnormalities. There is  severe left ventricular hypertrophy. Left ventricular diastolic parameters  are consistent with Grade I diastolic dysfunction (impaired relaxation).   2. Right ventricular systolic function is normal. The right ventricular  size is normal.   3. The mitral valve is normal in structure. No evidence of mitral valve  regurgitation. No evidence of mitral stenosis.   4. The aortic valve is normal in structure. Aortic valve regurgitation is  not visualized. No aortic stenosis is present.   5. The inferior vena cava is normal in size with greater than 50%  respiratory variability, suggesting right atrial pressure of 3 mmHg.   Patient Profile  Robert Chaney is a 70 y.o. male with CKD stage IV, hypertrophic cardiomyopathy, diastolic heart failure, hypertension, diabetes, hyperlipidemia, chronic hypoxic respiratory failure on 4 L nasal cannula who was admitted on 02/28/2021 with acute on chronic hypoxic respiratory failure, anemia, pneumonia.  Underwent EGD on 03/03/2021 which demonstrated gastritis.  Assessment & Plan   #Acute on chronic hypoxic respiratory failure/HFpEF/hypertrophic cardiomyopathy/hypertension -Appears euvolemic.  Net -2.7 L overnight.  Net  -4.2 L since admission. -No further IV diuretics.  He appears euvolemic.  Transition to torsemide 40 mg daily.  He was on Lasix at home.  Torsemide likely better tolerated. -Procalcitonin negative.  Chest x-ray with concern for right middle lobe pneumonia.  Pulmonary with plans for bronchoscopy today. On Abx.  -We will hold on further diuresis.  Will await work-up per pulmonary.  #Hypertension -BPs are stable.  Continue Coreg 12.5 twice daily, amlodipine 10 mg daily, clonidine 0.2 mg daily. -Hydralazine 100 mg 3 times daily has been added back.  Also on Imdur at  home that can be added back.  #Anemia -2/2 gastritis. PPI.   For questions or updates, please contact [place] Please consult [email or web address] for contact info under   Time Spent with Patient: I have spent a total of 35 minutes with patient reviewing hospital notes, telemetry, EKGs, labs and examining the patient as well as establishing an assessment and plan that was discussed with the patient.  > 50% of time was spent in direct patient care.    Signed, Addison Naegeli. Audie Box, MD, [place]  03/05/2021 8:18 AM

## 2021-03-05 NOTE — Plan of Care (Signed)
Discussed with patient plan of care for the evening, pain management and fluid restriction with some teach back displayed.  Problem: Education: Goal: Knowledge of General Education information will improve Description: Including pain rating scale, medication(s)/side effects and non-pharmacologic comfort measures Outcome: Progressing

## 2021-03-05 NOTE — Progress Notes (Signed)
Pt to EGD. RN not informed of low CBG by tech. This RN called CBG to Endo to initiate hypoglycemia protocol. Endo RN said she saw result.

## 2021-03-06 ENCOUNTER — Encounter (HOSPITAL_COMMUNITY): Payer: Self-pay | Admitting: Internal Medicine

## 2021-03-06 DIAGNOSIS — J9621 Acute and chronic respiratory failure with hypoxia: Secondary | ICD-10-CM | POA: Diagnosis not present

## 2021-03-06 LAB — GLUCOSE, CAPILLARY
Glucose-Capillary: 228 mg/dL — ABNORMAL HIGH (ref 70–99)
Glucose-Capillary: 255 mg/dL — ABNORMAL HIGH (ref 70–99)
Glucose-Capillary: 230 mg/dL — ABNORMAL HIGH (ref 70–99)
Glucose-Capillary: 191 mg/dL — ABNORMAL HIGH (ref 70–99)
Glucose-Capillary: 237 mg/dL — ABNORMAL HIGH (ref 70–99)
Glucose-Capillary: 182 mg/dL — ABNORMAL HIGH (ref 70–99)

## 2021-03-06 LAB — COMPREHENSIVE METABOLIC PANEL
ALT: 53 U/L — ABNORMAL HIGH (ref 0–44)
AST: 23 U/L (ref 15–41)
Albumin: 2.3 g/dL — ABNORMAL LOW (ref 3.5–5.0)
Alkaline Phosphatase: 58 U/L (ref 38–126)
Anion gap: 8 (ref 5–15)
BUN: 50 mg/dL — ABNORMAL HIGH (ref 8–23)
CO2: 28 mmol/L (ref 22–32)
Calcium: 8.3 mg/dL — ABNORMAL LOW (ref 8.9–10.3)
Chloride: 98 mmol/L (ref 98–111)
Creatinine, Ser: 3 mg/dL — ABNORMAL HIGH (ref 0.61–1.24)
GFR, Estimated: 21 mL/min — ABNORMAL LOW (ref >60)
Glucose, Bld: 247 mg/dL — ABNORMAL HIGH (ref 70–99)
Potassium: 4.8 mmol/L (ref 3.5–5.1)
Sodium: 134 mmol/L — ABNORMAL LOW (ref 135–145)
Total Bilirubin: 0.7 mg/dL (ref 0.3–1.2)
Total Protein: 6.3 g/dL — ABNORMAL LOW (ref 6.5–8.1)

## 2021-03-06 LAB — CYTOLOGY - NON PAP

## 2021-03-06 LAB — CBC WITH DIFFERENTIAL/PLATELET
Abs Immature Granulocytes: 0.02 10*3/uL (ref 0.00–0.07)
Basophils Absolute: 0 10*3/uL (ref 0.0–0.1)
Basophils Relative: 0 %
Eosinophils Absolute: 0 10*3/uL (ref 0.0–0.5)
Eosinophils Relative: 0 %
HCT: 29.4 % — ABNORMAL LOW (ref 39.0–52.0)
Hemoglobin: 9 g/dL — ABNORMAL LOW (ref 13.0–17.0)
Immature Granulocytes: 0 %
Lymphocytes Relative: 10 %
Lymphs Abs: 0.6 10*3/uL — ABNORMAL LOW (ref 0.7–4.0)
MCH: 28.4 pg (ref 26.0–34.0)
MCHC: 30.6 g/dL (ref 30.0–36.0)
MCV: 92.7 fL (ref 80.0–100.0)
Monocytes Absolute: 0.1 10*3/uL (ref 0.1–1.0)
Monocytes Relative: 1 %
Neutro Abs: 5.4 10*3/uL (ref 1.7–7.7)
Neutrophils Relative %: 89 %
Platelets: 192 10*3/uL (ref 150–400)
RBC: 3.17 MIL/uL — ABNORMAL LOW (ref 4.22–5.81)
RDW: 15.6 % — ABNORMAL HIGH (ref 11.5–15.5)
WBC: 6.1 10*3/uL (ref 4.0–10.5)
nRBC: 0 % (ref 0.0–0.2)

## 2021-03-06 LAB — PROCALCITONIN: Procalcitonin: <0.1 ng/mL

## 2021-03-06 MED ORDER — LATANOPROST 0.005 % OP SOLN
1.0000 [drp] | Freq: Every day | OPHTHALMIC | Status: DC
  Administered 2021-03-06 – 2021-03-09 (×4): 1 [drp] via OPHTHALMIC
  Filled 2021-03-06: qty 2.5

## 2021-03-06 MED ORDER — MAGNESIUM HYDROXIDE 400 MG/5ML PO SUSP
30.0000 mL | Freq: Two times a day (BID) | ORAL | Status: DC
Start: 2021-03-06 — End: 2021-03-06
  Administered 2021-03-06: 30 mL via ORAL
  Filled 2021-03-06: qty 30

## 2021-03-06 MED ORDER — BRIMONIDINE TARTRATE 0.2 % OP SOLN
1.0000 [drp] | Freq: Two times a day (BID) | OPHTHALMIC | Status: DC
  Administered 2021-03-06 – 2021-03-10 (×9): 1 [drp] via OPHTHALMIC
  Filled 2021-03-06: qty 5

## 2021-03-06 MED ORDER — DOCUSATE SODIUM 100 MG PO CAPS
100.0000 mg | ORAL_CAPSULE | Freq: Two times a day (BID) | ORAL | Status: DC
  Administered 2021-03-06 – 2021-03-07 (×4): 100 mg via ORAL
  Filled 2021-03-06 (×4): qty 1

## 2021-03-06 MED ORDER — SODIUM CHLORIDE 0.9 % IV BOLUS
500.0000 mL | Freq: Once | INTRAVENOUS | Status: AC
  Administered 2021-03-06: 500 mL via INTRAVENOUS

## 2021-03-06 MED ORDER — DOCUSATE SODIUM 100 MG PO CAPS: 100.0000 mg | ORAL_CAPSULE | Freq: Two times a day (BID) | ORAL | Status: DC | PRN

## 2021-03-06 MED ORDER — POLYETHYLENE GLYCOL 3350 17 G PO PACK
17.0000 g | PACK | Freq: Two times a day (BID) | ORAL | Status: DC
  Administered 2021-03-06 – 2021-03-07 (×4): 17 g via ORAL
  Filled 2021-03-06 (×4): qty 1

## 2021-03-06 MED ORDER — ISOSORBIDE MONONITRATE ER 30 MG PO TB24
30.0000 mg | ORAL_TABLET | Freq: Every day | ORAL | Status: DC
  Administered 2021-03-06 – 2021-03-09 (×4): 30 mg via ORAL
  Filled 2021-03-06 (×4): qty 1

## 2021-03-06 MED ORDER — PANTOPRAZOLE SODIUM 40 MG PO TBEC: 40.0000 mg | DELAYED_RELEASE_TABLET | Freq: Every day | ORAL | Status: DC

## 2021-03-06 MED ORDER — DORZOLAMIDE HCL 2 % OP SOLN
1.0000 [drp] | Freq: Two times a day (BID) | OPHTHALMIC | Status: DC
  Administered 2021-03-06 – 2021-03-10 (×9): 1 [drp] via OPHTHALMIC
  Filled 2021-03-06: qty 10

## 2021-03-06 NOTE — Progress Notes (Signed)
Cardiology Progress Note  Patient ID: Robert Chaney MRN: [id number] DOB: 08-20-1950 Date of Encounter: 03/06/2021  Primary Cardiologist: Skeet Latch, MD  Subjective   Chief Complaint: Shortness of breath  HPI: Bronchoscopy with endobronchial lesion yesterday.  Transition to oral diuretics.  Back on home oxygen requirement.  Euvolemic.  ROS:  All other ROS reviewed and negative. Pertinent positives noted in the HPI.     Inpatient Medications  Scheduled Meds:  amLODipine  10 mg Oral Daily   atorvastatin  20 mg Oral Daily   brimonidine  1 drop Right Eye BID   carvedilol  12.5 mg Oral BID   cloNIDine  0.2 mg Oral BID   docusate sodium  100 mg Oral BID   dorzolamide  1 drop Both Eyes BID   hydrALAZINE  100 mg Oral Q8H   insulin aspart  0-9 Units Subcutaneous TID WC   insulin aspart  2 Units Subcutaneous TID WC   insulin glargine-yfgn  12 Units Subcutaneous QHS   latanoprost  1 drop Left Eye QHS   magnesium hydroxide  30 mL Oral BID   pantoprazole (PROTONIX) IV  40 mg Intravenous Q12H   pneumococcal 23 valent vaccine  0.5 mL Intramuscular Tomorrow-1000   polyethylene glycol  17 g Oral BID   torsemide  40 mg Oral Daily   Continuous Infusions:  PRN Meds: acetaminophen **OR** [DISCONTINUED] acetaminophen, ondansetron (ZOFRAN) IV   Vital Signs   Vitals:   03/06/21 0534 03/06/21 0535 03/06/21 0600 03/06/21 0800  BP: (!) 148/70  (!) 143/67 118/69  Pulse:   63 65  Resp:   16 (!) 23  Temp:    97.7 F (36.5 C)  TempSrc:    Oral  SpO2:   100%   Weight:  71.6 kg    Height:        Intake/Output Summary (Last 24 hours) at 03/06/2021 0830 Last data filed at 03/06/2021 0500 Gross per 24 hour  Intake 420 ml  Output 1225 ml  Net -805 ml   Last 3 Weights 03/06/2021 03/06/2021 03/05/2021  Weight (lbs) 157 lb 13.6 oz 160 lb 15 oz 157 lb 3 oz  Weight (kg) 71.6 kg 73 kg 71.3 kg      Telemetry  Overnight telemetry shows sinus rhythm in the 60s, brief ectopic atrial tachycardia  less than 3 seconds, which I personally reviewed.    Physical Exam   Vitals:   03/06/21 0534 03/06/21 0535 03/06/21 0600 03/06/21 0800  BP: (!) 148/70  (!) 143/67 118/69  Pulse:   63 65  Resp:   16 (!) 23  Temp:    97.7 F (36.5 C)  TempSrc:    Oral  SpO2:   100%   Weight:  71.6 kg    Height:        Intake/Output Summary (Last 24 hours) at 03/06/2021 0830 Last data filed at 03/06/2021 0500 Gross per 24 hour  Intake 420 ml  Output 1225 ml  Net -805 ml    Last 3 Weights 03/06/2021 03/06/2021 03/05/2021  Weight (lbs) 157 lb 13.6 oz 160 lb 15 oz 157 lb 3 oz  Weight (kg) 71.6 kg 73 kg 71.3 kg    Body mass index is 24 kg/m.   General: Well nourished, well developed, in no acute distress Head: Atraumatic, normal size  Eyes: PEERLA, EOMI  Neck: Supple, no JVD Endocrine: No thryomegaly Cardiac: Normal S1, S2; RRR; no murmurs, rubs, or gallops Lungs: Diminished breath sounds at the lung bases Abd:  Soft, nontender, no hepatomegaly  Ext: No edema, pulses 2+ Musculoskeletal: No deformities, BUE and BLE strength normal and equal Skin: Warm and dry, no rashes   Neuro: Alert and oriented to person, place, time, and situation, CNII-XII grossly intact, no focal deficits  Psych: Normal mood and affect   Labs  High Sensitivity Troponin:   Recent Labs  Lab 02/28/21 2042 02/28/21 2249  TROPONINIHS 545* 546*     Cardiac EnzymesNo results for input(s): TROPONINI in the last 168 hours. No results for input(s): TROPIPOC in the last 168 hours.  Chemistry Recent Labs  Lab 03/04/21 0600 03/05/21 0043 03/06/21 0010  NA 137 136 134*  K 4.0 4.0 4.8  CL 102 101 98  CO2 _0 GLUCOSE 122* 192* 247*  BUN 44* 46* 50*  CREATININE 2.56* 2.96* 3.00*  CALCIUM 8.5* 8.2* 8.3*  PROT 6.5 5.7* 6.3*  ALBUMIN 2.5* 2.2* 2.3*  AST 41 33 23  ALT 77* 64* 53*  ALKPHOS 60 61 58  BILITOT 0.7 0.8 0.7  GFRNONAA 26* 22* 21*  ANIONGAP _1 Hematology Recent Labs  Lab 03/04/21 0600  03/05/21 0043 03/06/21 0010  WBC 5.5 5.1 6.1  RBC 3.36* 2.79* 3.17*  HGB 9.5* 7.9* 9.0*  HCT 31.2* 25.7* 29.4*  MCV 92.9 92.1 92.7  MCH 28.3 28.3 28.4  MCHC 30.4 30.7 30.6  RDW 15.9* 15.8* 15.6*  PLT 225 188 192   BNP Recent Labs  Lab 02/28/21 2042 03/02/21 0540  BNP 2,327.5* 1,340.6*    DDimer No results for input(s): DDIMER in the last 168 hours.   Radiology  DG CHEST PORT 1 VIEW  Result Date: 03/05/2021 CLINICAL DATA:  Post bronchoscopy EXAM: PORTABLE CHEST 1 VIEW COMPARISON:  02/28/2021 FINDINGS: The lungs are symmetrically well expanded. Airspace infiltrate within the right mid lung zone appears improved since prior examination, particularly when compared to remote prior examination of 01/30/2021. No pneumothorax or pleural effusion. Mild cardiomegaly is stable. Central pulmonary arteries are enlarged in keeping with changes of pulmonary arterial hypertension. No acute bone abnormality. IMPRESSION: No pneumothorax following bronchoscopy. Improving right mid lung zone pulmonary infiltrate. Stable cardiomegaly and enlargement of the central pulmonary arteries. Electronically Signed   By: Fidela Salisbury M.D.   On: 03/05/2021 16:34   DG C-ARM BRONCHOSCOPY  Result Date: 03/05/2021 C-ARM BRONCHOSCOPY: Fluoroscopy was utilized by the requesting physician.  No radiographic interpretation.    Cardiac Studies  TTE 01/30/2021  1. Peak outflow gradient velocity 3.35 m/s, peak gradient 45 mmHg. Left  ventricular ejection fraction, by estimation, is 70 to 75%. The left  ventricle has hyperdynamic function. The left ventricle has no regional  wall motion abnormalities. There is  severe left ventricular hypertrophy. Left ventricular diastolic parameters  are consistent with Grade I diastolic dysfunction (impaired relaxation).   2. Right ventricular systolic function is normal. The right ventricular  size is normal.   3. The mitral valve is normal in structure. No evidence of mitral valve   regurgitation. No evidence of mitral stenosis.   4. The aortic valve is normal in structure. Aortic valve regurgitation is  not visualized. No aortic stenosis is present.   5. The inferior vena cava is normal in size with greater than 50%  respiratory variability, suggesting right atrial pressure of 3 mmHg.   Patient Profile  Robert Chaney is a 70 y.o. male with CKD stage IV, hypertrophic cardiomyopathy, diastolic heart failure, hypertension, diabetes, hyperlipidemia, chronic hypoxic respiratory failure on  4 L nasal cannula who was admitted on 02/28/2021 with acute on chronic hypoxic respiratory failure, anemia, pneumonia.  Underwent EGD on 03/03/2021 which demonstrated gastritis.  Assessment & Plan   #Acute on chronic hypoxic respiratory failure #HFpEF #Hypertrophic obstructive cardiomyopathy #Hypertension -Euvolemic.  Net -5.0 L since admission.  Transition to oral diuretics yesterday.  Would continue 40 mg of torsemide at discharge. -Appears to be related to poor diet compliance.  He consumes a lot of salt at home upon discussion. -He is on his home oxygen requirement.  No need for further aggressive diuresis.  We will arrange hospital follow-up. -Would continue Coreg 12.5 twice daily, amlodipine 10 mg daily, clonidine 0.2 mg daily.  We added back hydralazine 100 mg 3 times daily.  I will add back his Imdur as well.  #AKI -Multifactorial in setting of pneumonia as well as anemia and aggressive diuresis.  Creatinine not far off from baseline.  eGFR is relatively stable.  [facility] will sign off.   Medication Recommendations: Medications as above. Other recommendations (labs, testing, etc): None. Follow up as an outpatient: We will arrange hospital follow-up in 2 to 3 weeks.  For questions or updates, please contact [place] Please consult [email or web address] for contact info under   Time Spent with Patient: I have spent a total of 35 minutes with patient reviewing hospital notes,  telemetry, EKGs, labs and examining the patient as well as establishing an assessment and plan that was discussed with the patient.  > 50% of time was spent in direct patient care.    Signed, Addison Naegeli. Audie Box, MD, [place]  03/06/2021 8:30 AM

## 2021-03-06 NOTE — Care Management Important Message (Signed)
Important Message  Patient Details  Name: Robert Chaney MRN: [id number] Date of Birth: [birth year]   Medicare Important Message Given:  Yes     Orbie Pyo 03/06/2021, 12:32 PM
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
## 2021-03-06 NOTE — Progress Notes (Signed)
PROGRESS NOTE        PATIENT DETAILS Name: Robert Chaney Age: 70 y.o. Sex: male Date of Birth: August 18, 1950 Admit Date: 02/28/2021 Admitting Physician Vianne Bulls, MD MPN:[id number], Christean Grief, MD  Brief Narrative: Patient is a 70 y.o. male with history of CKD stage IV, HOCM, HFpEF, IDDM, HTN, OSA on CPAP, persistent RML infiltrate on chest imaging, chronic hypoxic respiratory failure on 3-4 L of oxygen at home-presented with worsening shortness of breath, chills, dark stools and fatigue.  He was found to have acute on chronic hypoxic respiratory failure due to a combination of right middle lobe infiltrate/HFpEF with exacerbation and worsening of his baseline hemoglobin.  He was subsequently admitted to the hospitalist service.  See below for further details.  Significant events: 9/7>> admit for shortness of breath/fever/black stools-worsening hypoxemia-imaging with persistent right middle lobe infiltrate-on BiPAP-started on antibiotics/Lasix and PRBC transfusion.   9/8>> liberated off BiPAP-placed on O2 3-4 L  Significant studies: 8/9>> Echo: EF 70-75% 9/7>> CXR: Right midlung airspace disease worrisome for pneumonia.  Antimicrobial therapy: Vancomycin: 9/7>> 9/9 Cefepime: 9/7>> 9/9 Unasyn: 9/9>>  Microbiology data: 9/7>> COVID/influenza PCR: Negative 9/7>> blood culture: No growth  Procedures : EGD done 03/03/21 - mild gastritis. RUL Posterior subsegment BAL/Brushing/Tbbx all sent for culture and cytology >> 03/05/21  Consults: Cardiology, PCCM, GI  DVT Prophylaxis : Place and maintain sequential compression device Start: 03/03/21 0724 SCDs Start: 02/28/21 2322   Subjective:  Patient in bed, appears comfortable, denies any headache, no fever, no chest pain or pressure, no shortness of breath , no abdominal pain. No new focal weakness.     Assessment/Plan:  Acute on chronic hypoxic respiratory failure: Secondary to HFpEF exacerbation and  postobstructive pneumonia.  Initially on BiPAP-liberated of the BiPAP at 9/8.  He is back on his usual regimen of 2-3 L of oxygen.  Continue diuretics along with antibiotics per pulmonary.  Much improved, will try and advance activity and titrate down oxygen ,prepare for discharge in the next 1 to 2 days  Persistent right middle lobe infiltrate likely postobstructive pneumonia: Suspicion for a endobronchial lesion -he underwent bronchoscopy with biopsy on 03/05/2021, pending cytology and cultures, post discharge follow-up with Dr. [name] in 1 to 2 weeks, stopped vancomycin/cefepime-switched to Unasyn.  Serum Quanteferon levels negative.  HFpEF with exacerbation: Improved-on diuretics-cardiology following.  Need to be cautious with preload reduction given history of HOCM.    ?  Upper GI bleed: Apparently has a black stools for the past several weeks-no BM since admission.  Since his respiratory status is stabilized-have consulted GI.  Continue PPI. EGD done 03/03/21 - mild gastritis.  Constipation.  Placed on bowel regimen.    Elevated troponins: Probably related demand ischemia in the setting of anemia/respiratory failure.  Appreciate cardiology input.  Due to concern for GI bleeding/severity of anemia-hold off on antiplatelets for now.  HOCM: Watch closely-minimize preload reduction is much as possible.  Resume beta-blocker.  Anemia: Multifactorial-has anemia related to CKD at baseline-question of GI bleeding and acute blood loss as well.  Hemoglobin stable after 1 unit of PRBC on admission-continue to watch closely.    HTN: BP much better after resumption of his usual medications-continue amlodipine/Coreg/clonidine, hydralazine and Imdur being added, adjusted further on 03/06/2021  CKD stage IV: Creatinine close to baseline-avoid nephrotoxic agents-follow.  History of renal artery stenosis  OSA: On  CPAP nightly  DM-2 (A1c 5.8 on 7/22): CBG low am 03/03/21, Insulin adjusted, will monitor    Recent Labs    03/05/21 2140 03/06/21 0713 03/06/21 0808  GLUCAP 292* 228* 255*     Diet: Diet Order             Diet renal with fluid restriction Fluid restriction: 1200 mL Fluid; Room service appropriate? Yes; Fluid consistency: Thin  Diet effective now                    Code Status: Full code   Family Communication:  None at bedside  Disposition Plan: Status is: Inpatient  Remains inpatient appropriate because:Inpatient level of care appropriate due to severity of illness  Dispo: The patient is from: Home              Anticipated d/c is to: Home              Patient currently is not medically stable to d/c.   Difficult to place patient No   Barriers to Discharge: On BiPAP-on IV antibiotics/IV Lasix-not yet stable for discharge.  Antimicrobial agents: Anti-infectives (From admission, onward)    Start     Dose/Rate Route Frequency Ordered Stop   03/02/21 1400  Ampicillin-Sulbactam (UNASYN) 3 g in sodium chloride 0.9 % 100 mL IVPB  Status:  Discontinued        3 g 200 mL/hr over 30 Minutes Intravenous Every 12 hours 03/02/21 1314 03/05/21 0834   03/01/21 2300  vancomycin (VANCOREADY) IVPB 750 mg/150 mL  Status:  Discontinued        750 mg 150 mL/hr over 60 Minutes Intravenous Every 24 hours 02/28/21 2347 03/02/21 1258   03/01/21 0000  vancomycin (VANCOREADY) IVPB 1500 mg/300 mL        1,500 mg 150 mL/hr over 120 Minutes Intravenous  Once 02/28/21 2347 03/01/21 0215   02/28/21 2345  ceFEPIme (MAXIPIME) 2 g in sodium chloride 0.9 % 100 mL IVPB  Status:  Discontinued       Note to Pharmacy: Cefepime 2 g IV q24h for CrCl < 30 mL/min   2 g 200 mL/hr over 30 Minutes Intravenous Daily at bedtime 02/28/21 2326 03/02/21 1258   02/28/21 2230  doxycycline (VIBRAMYCIN) 100 mg in sodium chloride 0.9 % 250 mL IVPB        100 mg 125 mL/hr over 120 Minutes Intravenous  Once 02/28/21 2219 03/01/21 0050        Time spent:35 minutes-Greater than 50% of this time was  spent in counseling, explanation of diagnosis, planning of further management, and coordination of care.  MEDICATIONS: Scheduled Meds:  amLODipine  10 mg Oral Daily   atorvastatin  20 mg Oral Daily   brimonidine  1 drop Right Eye BID   carvedilol  12.5 mg Oral BID   cloNIDine  0.2 mg Oral BID   docusate sodium  100 mg Oral BID   dorzolamide  1 drop Both Eyes BID   hydrALAZINE  100 mg Oral Q8H   insulin aspart  0-9 Units Subcutaneous TID WC   insulin aspart  2 Units Subcutaneous TID WC   insulin glargine-yfgn  12 Units Subcutaneous QHS   isosorbide mononitrate  30 mg Oral Daily   latanoprost  1 drop Left Eye QHS   magnesium hydroxide  30 mL Oral BID   pantoprazole (PROTONIX) IV  40 mg Intravenous Q12H   pneumococcal 23 valent vaccine  0.5 mL Intramuscular Tomorrow-1000  polyethylene glycol  17 g Oral BID   torsemide  40 mg Oral Daily   Continuous Infusions:   PRN Meds:.acetaminophen **OR** [DISCONTINUED] acetaminophen, ondansetron (ZOFRAN) IV   PHYSICAL EXAM: Vital signs: Vitals:   03/06/21 0534 03/06/21 0535 03/06/21 0600 03/06/21 0800  BP: (!) 148/70  (!) 143/67 118/69  Pulse:   63 65  Resp:   16 (!) 23  Temp:    97.7 F (36.5 C)  TempSrc:    Oral  SpO2:   100%   Weight:  71.6 kg    Height:       Filed Weights   03/05/21 1224 03/06/21 0316 03/06/21 0535  Weight: 71.3 kg 73 kg 71.6 kg   Body mass index is 24 kg/m.   Exam  Awake Alert, No new F.N deficits, Normal affect [place].AT,PERRAL Supple Neck,No JVD, No cervical lymphadenopathy appriciated.  Symmetrical Chest wall movement, Good air movement bilaterally, CTAB RRR,No Gallops, Rubs or new Murmurs, No Parasternal Heave +ve B.Sounds, Abd Soft, No tenderness, No organomegaly appriciated, No rebound - guarding or rigidity. No Cyanosis, Clubbing or edema, No new Rash or bruise     I have personally reviewed following labs and imaging studies  LABORATORY DATA:  Recent Labs  Lab 02/28/21 2042  02/28/21 2051 03/02/21 0540 03/03/21 0036 03/04/21 0600 03/05/21 0043 03/06/21 0010  WBC 4.4   < > 5.8 5.8 5.5 5.1 6.1  HGB 6.7*   < > 8.1* 7.9* 9.5* 7.9* 9.0*  HCT 22.7*   < > 27.0* 26.1* 31.2* 25.7* 29.4*  PLT 189   < > 196 186 225 188 192  MCV 95.0   < > 93.4 92.6 92.9 92.1 92.7  MCH 28.0   < > 28.0 28.0 28.3 28.3 28.4  MCHC 29.5*   < > 30.0 30.3 30.4 30.7 30.6  RDW 16.4*   < > 16.3* 16.1* 15.9* 15.8* 15.6*  LYMPHSABS 1.2  --   --   --  1.2 1.2 0.6*  MONOABS 0.5  --   --   --  0.5 0.6 0.1  EOSABS 0.0  --   --   --  0.1 0.1 0.0  BASOSABS 0.0  --   --   --  0.0 0.0 0.0   < > = values in this interval not displayed.    Recent Labs  Lab 02/28/21 2042 02/28/21 2051 03/01/21 2027 03/02/21 0540 03/03/21 0036 03/04/21 0600 03/04/21 0739 03/05/21 0043 03/06/21 0010  NA 137   < >  --  139 137 137  --  136 134*  K 4.6   < >  --  4.1 3.8 4.0  --  4.0 4.8  CL 108  --   --  107 102 102  --  101 98  CO2 24  --   --  _0 --  29 28  GLUCOSE 280*  --   --  81 92 122*  --  192* 247*  BUN 41*  --   --  44* 46* 44*  --  46* 50*  CREATININE 2.93*  --   --  2.83* 2.70* 2.56*  --  2.96* 3.00*  CALCIUM 8.3*  --   --  8.5* 8.5* 8.5*  --  8.2* 8.3*  AST 74*  --   --  34 32 41  --  33 23  ALT 74*  --   --  65* 66* 77*  --  64* 53*  ALKPHOS 58  --   --  59 53 60  --  61 58  BILITOT 0.7  --   --  0.8 0.9 0.7  --  0.8 0.7  ALBUMIN 2.4*  --   --  2.2* 2.3* 2.5*  --  2.2* 2.3*  PROCALCITON  --   --  0.24 0.21  --   --  <0.10 <0.10 <0.10  LATICACIDVEN 1.1  --   --   --   --   --   --   --   --   INR 1.2  --   --   --   --   --   --   --   --   BNP 2,327.5*  --   --  1,340.6*  --   --   --   --   --    < > = values in this interval not displayed.     RADIOLOGY STUDIES/RESULTS: DG CHEST PORT 1 VIEW  Result Date: 03/05/2021 CLINICAL DATA:  Post bronchoscopy EXAM: PORTABLE CHEST 1 VIEW COMPARISON:  02/28/2021 FINDINGS: The lungs are symmetrically well expanded. Airspace infiltrate within  the right mid lung zone appears improved since prior examination, particularly when compared to remote prior examination of 01/30/2021. No pneumothorax or pleural effusion. Mild cardiomegaly is stable. Central pulmonary arteries are enlarged in keeping with changes of pulmonary arterial hypertension. No acute bone abnormality. IMPRESSION: No pneumothorax following bronchoscopy. Improving right mid lung zone pulmonary infiltrate. Stable cardiomegaly and enlargement of the central pulmonary arteries. Electronically Signed   By: Fidela Salisbury M.D.   On: 03/05/2021 16:34   DG C-ARM BRONCHOSCOPY  Result Date: 03/05/2021 C-ARM BRONCHOSCOPY: Fluoroscopy was utilized by the requesting physician.  No radiographic interpretation.     LOS: 6 days   Signature  Lala Lund M.D on 03/06/2021 at 8:41 AM   -  To page go to [email or web address]    03/06/2021, 8:41 AM

## 2021-03-06 NOTE — Anesthesia Postprocedure Evaluation (Signed)
Anesthesia Post Note  Patient: Robert Chaney  Procedure(s) Performed: VIDEO BRONCHOSCOPY WITH FLUORO BRONCHIAL BIOPSIES BRONCHIAL BRUSHINGS BRONCHIAL WASHINGS HEMOSTASIS CONTROL     Patient location during evaluation: PACU Anesthesia Type: General Level of consciousness: awake and alert Pain management: pain level controlled Vital Signs Assessment: post-procedure vital signs reviewed and stable Respiratory status: spontaneous breathing, nonlabored ventilation, respiratory function stable and patient connected to nasal cannula oxygen Cardiovascular status: blood pressure returned to baseline and stable Postop Assessment: no apparent nausea or vomiting Anesthetic complications: no   No notable events documented.  Last Vitals:  Vitals:   03/06/21 0800 03/06/21 1111  BP: 118/69 137/69  Pulse: 65 (!) 55  Resp: (!) 23 15  Temp: 36.5 C (!) 36.4 C  SpO2:  100%    Last Pain:  Vitals:   03/06/21 1111  TempSrc: Oral  PainSc:                  Tiajuana Amass

## 2021-03-06 NOTE — Progress Notes (Signed)
Dr. Candiss Norse wanted to start weaning pt off of O2. Pt refused and stated that his baseline at home is 4L O2. He is currently on 3L O2 and O2 sat is 100%. Will continue to monitor O2 sat and Pt tolerance.  Jarrett Soho, RN
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
## 2021-03-06 NOTE — Plan of Care (Signed)
Follow up arranged with [place] office with cardiology on 03/16/21, see AVS.

## 2021-03-07 ENCOUNTER — Telehealth: Payer: Self-pay | Admitting: Pulmonary Disease

## 2021-03-07 ENCOUNTER — Inpatient Hospital Stay (HOSPITAL_COMMUNITY): Payer: PPO

## 2021-03-07 LAB — CBC WITH DIFFERENTIAL/PLATELET
Abs Immature Granulocytes: 0.01 10*3/uL (ref 0.00–0.07)
Basophils Absolute: 0 10*3/uL (ref 0.0–0.1)
Basophils Relative: 0 %
Eosinophils Absolute: 0.1 10*3/uL (ref 0.0–0.5)
Eosinophils Relative: 1 %
HCT: 23.4 % — ABNORMAL LOW (ref 39.0–52.0)
Hemoglobin: 7.1 g/dL — ABNORMAL LOW (ref 13.0–17.0)
Immature Granulocytes: 0 %
Lymphocytes Relative: 20 %
Lymphs Abs: 1.3 10*3/uL (ref 0.7–4.0)
MCH: 28.1 pg (ref 26.0–34.0)
MCHC: 30.3 g/dL (ref 30.0–36.0)
MCV: 92.5 fL (ref 80.0–100.0)
Monocytes Absolute: 0.6 10*3/uL (ref 0.1–1.0)
Monocytes Relative: 9 %
Neutro Abs: 4.3 10*3/uL (ref 1.7–7.7)
Neutrophils Relative %: 70 %
Platelets: 183 10*3/uL (ref 150–400)
RBC: 2.53 MIL/uL — ABNORMAL LOW (ref 4.22–5.81)
RDW: 15.5 % (ref 11.5–15.5)
WBC: 6.1 10*3/uL (ref 4.0–10.5)
nRBC: 0 % (ref 0.0–0.2)

## 2021-03-07 LAB — GLUCOSE, CAPILLARY
Glucose-Capillary: 130 mg/dL — ABNORMAL HIGH (ref 70–99)
Glucose-Capillary: 93 mg/dL (ref 70–99)
Glucose-Capillary: 176 mg/dL — ABNORMAL HIGH (ref 70–99)
Glucose-Capillary: 163 mg/dL — ABNORMAL HIGH (ref 70–99)

## 2021-03-07 LAB — CULTURE, BAL-QUANTITATIVE W GRAM STAIN: Culture: NO GROWTH

## 2021-03-07 LAB — COMPREHENSIVE METABOLIC PANEL
ALT: 37 U/L (ref 0–44)
AST: 12 U/L — ABNORMAL LOW (ref 15–41)
Albumin: 2.2 g/dL — ABNORMAL LOW (ref 3.5–5.0)
Alkaline Phosphatase: 55 U/L (ref 38–126)
Anion gap: 5 (ref 5–15)
BUN: 65 mg/dL — ABNORMAL HIGH (ref 8–23)
CO2: 29 mmol/L (ref 22–32)
Calcium: 8 mg/dL — ABNORMAL LOW (ref 8.9–10.3)
Chloride: 99 mmol/L (ref 98–111)
Creatinine, Ser: 3.78 mg/dL — ABNORMAL HIGH (ref 0.61–1.24)
GFR, Estimated: 16 mL/min — ABNORMAL LOW (ref >60)
Glucose, Bld: 174 mg/dL — ABNORMAL HIGH (ref 70–99)
Potassium: 4.8 mmol/L (ref 3.5–5.1)
Sodium: 133 mmol/L — ABNORMAL LOW (ref 135–145)
Total Bilirubin: 0.7 mg/dL (ref 0.3–1.2)
Total Protein: 5.6 g/dL — ABNORMAL LOW (ref 6.5–8.1)

## 2021-03-07 LAB — ACID FAST SMEAR (AFB, MYCOBACTERIA)
Acid Fast Smear: NEGATIVE
Acid Fast Smear: NEGATIVE
Acid Fast Smear: NEGATIVE

## 2021-03-07 LAB — CBC
HCT: 26.1 % — ABNORMAL LOW (ref 39.0–52.0)
Hemoglobin: 7.9 g/dL — ABNORMAL LOW (ref 13.0–17.0)
MCH: 28 pg (ref 26.0–34.0)
MCHC: 30.3 g/dL (ref 30.0–36.0)
MCV: 92.6 fL (ref 80.0–100.0)
Platelets: 183 10*3/uL (ref 150–400)
RBC: 2.82 MIL/uL — ABNORMAL LOW (ref 4.22–5.81)
RDW: 15.5 % (ref 11.5–15.5)
WBC: 5.8 10*3/uL (ref 4.0–10.5)
nRBC: 0 % (ref 0.0–0.2)

## 2021-03-07 LAB — SEDIMENTATION RATE: Sed Rate: 22 mm/hr — ABNORMAL HIGH (ref 0–16)

## 2021-03-07 LAB — PREPARE RBC (CROSSMATCH)

## 2021-03-07 LAB — C-REACTIVE PROTEIN: CRP: 0.6 mg/dL (ref <1.0)

## 2021-03-07 MED ORDER — SODIUM CHLORIDE 0.9% IV SOLUTION: Freq: Once | INTRAVENOUS | Status: DC

## 2021-03-07 MED ORDER — HYDRALAZINE HCL 50 MG PO TABS
50.0000 mg | ORAL_TABLET | Freq: Three times a day (TID) | ORAL | Status: DC
  Administered 2021-03-07 – 2021-03-10 (×10): 50 mg via ORAL
  Filled 2021-03-07 (×10): qty 1

## 2021-03-07 MED ORDER — PANTOPRAZOLE SODIUM 40 MG IV SOLR
40.0000 mg | Freq: Two times a day (BID) | INTRAVENOUS | Status: DC
  Administered 2021-03-07 – 2021-03-10 (×7): 40 mg via INTRAVENOUS
  Filled 2021-03-07 (×7): qty 40

## 2021-03-07 NOTE — Progress Notes (Signed)
Mobility Specialist Progress Note    03/07/21 1736  Mobility  Activity Ambulated in hall  Level of Assistance Modified independent, requires aide device or extra time  Assistive Device Front wheel walker  Distance Ambulated (ft) 262 ft  Mobility Ambulated with assistance in hallway  Mobility Response Tolerated well  Mobility performed by Mobility specialist  $Mobility charge 1 Mobility   Pre-Mobility: 69 HR, 142/65 BP, 97% SpO2 During Mobility: 90% SpO2 Post-Mobility: 71 HR, 155/67 BP  Pt agreeable to ambulate. Asx throughout walk and returned to EOB with call bell in reach and wife present in the room.   Hildred Alamin Mobility Specialist  Mobility Specialist Phone: [phone]

## 2021-03-07 NOTE — Plan of Care (Signed)
  Problem: Activity: Goal: Risk for activity intolerance will decrease Outcome: Progressing   Problem: Elimination: Goal: Will not experience complications related to bowel motility Outcome: Not Progressing  PATIENT STILL EXPERIENCING CONSTIPATION WITH INTERVENTION

## 2021-03-07 NOTE — TOC Progression Note (Signed)
Transition of Care [facility]) - Progression Note    Patient Details  Name: Robert Chaney MRN: [id number] Date of Birth: Jan 11, 1951  [facility] CM/SW Contact  Zenon Mayo, RN Phone Number: 03/07/2021, 2:18 PM  Clinical Narrative:    Cont to diurese,  pt- eval - rec no pt follow up. Has home oxygen, 4 liters baseline.  TOC will continue to follow for dc needs.    Expected Discharge Plan: Home/Self Care Barriers to Discharge: Continued Medical Work up  Expected Discharge Plan and Services Expected Discharge Plan: Home/Self Care   Discharge Planning Services: CM Consult   Living arrangements for the past 2 months: Single Family Home                   DME Agency: NA       HH Arranged: NA           Social Determinants of Health (SDOH) Interventions    Readmission Risk Interventions Readmission Risk Prevention Plan 03/05/2021 05/12/2020  Transportation Screening Complete Complete  PCP or Specialist Appt within 3-5 Days Complete Complete  HRI or [place] Complete Complete  Social Work Consult for [place] Planning/Counseling Complete Complete  Palliative Care Screening Not Applicable Not Applicable  Medication Review Press photographer) Complete Complete  Some recent data might be hidden

## 2021-03-07 NOTE — Telephone Encounter (Signed)
Was able to get patient scheduled 03/28/21 at 2pm with Beth.

## 2021-03-07 NOTE — Progress Notes (Signed)
Gave patient soap suds enema, patient had small hard bowel movement that was black with red streaks.  Gave patient 1 cup of warm prune juice to encourage bowel movement.

## 2021-03-07 NOTE — Telephone Encounter (Signed)
Please schedule patient for hospital follow up with myself or an APP in 2-3 weeks for pneumonia and respiratory failure.   Thanks, Wille Glaser
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
## 2021-03-07 NOTE — Progress Notes (Signed)
PROGRESS NOTE        PATIENT DETAILS Name: Robert Chaney Age: 70 y.o. Sex: male Date of Birth: [birth year] Admit Date: 02/28/2021 Admitting Physician Vianne Bulls, MD JQB:HALPFXT, Christean Grief, MD  Brief Narrative: Patient is a 70 y.o. male with history of CKD stage IV, HOCM, HFpEF, IDDM, HTN, OSA on CPAP, persistent RML infiltrate on chest imaging, chronic hypoxic respiratory failure on 3-4 L of oxygen at home-presented with worsening shortness of breath, chills, dark stools and fatigue.  He was found to have acute on chronic hypoxic respiratory failure due to a combination of right middle lobe infiltrate/HFpEF with exacerbation and worsening of his baseline hemoglobin.  He was subsequently admitted to the hospitalist service.  See below for further details.  Significant events: 9/7>> admit for shortness of breath/fever/black stools-worsening hypoxemia-imaging with persistent right middle lobe infiltrate-on BiPAP-started on antibiotics/Lasix and PRBC transfusion.   9/8>> liberated off BiPAP-placed on O2 3-4 L  Significant studies: 8/9>> Echo: EF 70-75% 9/7>> CXR: Right midlung airspace disease worrisome for pneumonia.  Antimicrobial therapy: Vancomycin: 9/7>> 9/9 Cefepime: 9/7>> 9/9 Unasyn: 9/9>>  Microbiology data: 9/7>> COVID/influenza PCR: Negative 9/7>> blood culture: No growth  Procedures : EGD done 03/03/21 - mild gastritis. RUL Posterior subsegment BAL/Brushing/Tbbx all sent for culture and cytology >> 03/05/21  Consults: Cardiology, PCCM, GI  DVT Prophylaxis :  Place and maintain sequential compression device Start: 03/03/21 0724 SCDs Start: 02/28/21 2322   Subjective: Patient in bed, appears comfortable, denies any headache, no fever, no chest pain or pressure, no shortness of breath , no abdominal pain. No new focal weakness.   Assessment/Plan:  Acute on chronic hypoxic respiratory failure: Secondary to HFpEF exacerbation and postobstructive  pneumonia.  Initially on BiPAP-liberated of the BiPAP at 9/8.  He is back on his usual regimen of 3-4 L of oxygen.  Continue diuretics PRN.  Much improved, will try and advance activity and titrate down oxygen.  Persistent right middle lobe infiltrate likely postobstructive pneumonia: Suspicion for a endobronchial lesion -he underwent bronchoscopy with biopsy on 03/05/2021, pending cytology and cultures, post discharge follow-up with Dr. [name] in 1 to 2 weeks, stopped vancomycin/cefepime-switched to Unasyn.  Serum Quanteferon levels negative.  HFpEF with exacerbation: Cardiology following appears slightly over diuresed on 03/07/2021, diuretics and blood pressure medications held, will monitor closely,  Need to be cautious with preload reduction given history of HOCM.    ?  Upper GI bleed: Apparently has a black stools for the past several weeks-no BM since admission.  Since his respiratory status is stabilized-have consulted GI.  Continue PPI. EGD done 03/03/21 - mild gastritis.  Constipation.  Placed on bowel regimen.  Soapsuds enema x1 on 03/07/2021.  Elevated troponins: Probably related demand ischemia in the setting of anemia/respiratory failure.  Appreciate cardiology input.  Due to concern for GI bleeding/severity of anemia-hold off on antiplatelets for now.  HOCM: Watch closely-minimize preload reduction is much as possible.  Resume beta-blocker.  Anemia: Multifactorial-has anemia related to CKD at baseline-question of GI bleeding and acute blood loss as well.  Hemoglobin stable after 1 unit of PRBC on admission-continue to watch closely.    HTN: BP currently low on 03/07/2021, medications adjusted.  Will monitor.  Gradually reintroduce as needed.  AKI on CKD stage IV: Baseline creatinine around 2.9, hold further diuretics, stabilize blood pressure and monitor.  Has been  gently hydrated on 03/06/2021.  History of renal artery stenosis.  Supportive care monitor blood pressure.  OSA: On  CPAP nightly  DM-2 (A1c 5.8 on 7/22): CBG low am 03/03/21, Insulin adjusted, will monitor   Recent Labs    03/06/21 1828 03/06/21 2113 03/07/21 0627  GLUCAP 237* 182* 130*     Diet: Diet Order             DIET SOFT Room service appropriate? Yes; Fluid consistency: Nectar Thick  Diet effective now                    Code Status: Full code   Family Communication:  None at bedside  Disposition Plan: Status is: Inpatient  Remains inpatient appropriate because:Inpatient level of care appropriate due to severity of illness  Dispo: The patient is from: Home              Anticipated d/c is to: Home              Patient currently is not medically stable to d/c.   Difficult to place patient No   Barriers to Discharge: On BiPAP-on IV antibiotics/IV Lasix-not yet stable for discharge.  Antimicrobial agents: Anti-infectives (From admission, onward)    Start     Dose/Rate Route Frequency Ordered Stop   03/02/21 1400  Ampicillin-Sulbactam (UNASYN) 3 g in sodium chloride 0.9 % 100 mL IVPB  Status:  Discontinued        3 g 200 mL/hr over 30 Minutes Intravenous Every 12 hours 03/02/21 1314 03/05/21 0834   03/01/21 2300  vancomycin (VANCOREADY) IVPB 750 mg/150 mL  Status:  Discontinued        750 mg 150 mL/hr over 60 Minutes Intravenous Every 24 hours 02/28/21 2347 03/02/21 1258   03/01/21 0000  vancomycin (VANCOREADY) IVPB 1500 mg/300 mL        1,500 mg 150 mL/hr over 120 Minutes Intravenous  Once 02/28/21 2347 03/01/21 0215   02/28/21 2345  ceFEPIme (MAXIPIME) 2 g in sodium chloride 0.9 % 100 mL IVPB  Status:  Discontinued       Note to Pharmacy: Cefepime 2 g IV q24h for CrCl < 30 mL/min   2 g 200 mL/hr over 30 Minutes Intravenous Daily at bedtime 02/28/21 2326 03/02/21 1258   02/28/21 2230  doxycycline (VIBRAMYCIN) 100 mg in sodium chloride 0.9 % 250 mL IVPB        100 mg 125 mL/hr over 120 Minutes Intravenous  Once 02/28/21 2219 03/01/21 0050        Time spent:35  minutes-Greater than 50% of this time was spent in counseling, explanation of diagnosis, planning of further management, and coordination of care.  MEDICATIONS: Scheduled Meds:  sodium chloride   Intravenous Once   atorvastatin  20 mg Oral Daily   brimonidine  1 drop Right Eye BID   docusate sodium  100 mg Oral BID   dorzolamide  1 drop Both Eyes BID   hydrALAZINE  50 mg Oral Q8H   insulin aspart  0-9 Units Subcutaneous TID WC   insulin aspart  2 Units Subcutaneous TID WC   insulin glargine-yfgn  12 Units Subcutaneous QHS   isosorbide mononitrate  30 mg Oral Daily   latanoprost  1 drop Left Eye QHS   pantoprazole (PROTONIX) IV  40 mg Intravenous Q12H   pneumococcal 23 valent vaccine  0.5 mL Intramuscular Tomorrow-1000   polyethylene glycol  17 g Oral BID   Continuous Infusions:  PRN Meds:.acetaminophen **OR** [DISCONTINUED] acetaminophen, ondansetron (ZOFRAN) IV   PHYSICAL EXAM: Vital signs: Vitals:   03/06/21 2300 03/07/21 0239 03/07/21 0510 03/07/21 0731  BP: 114/63 130/65  (!) 149/73  Pulse: (!) 58 (!) 59  60  Resp: _0 Temp: 97.7 F (36.5 C) 97.7 F (36.5 C)  97.7 F (36.5 C)  TempSrc: Oral Oral    SpO2: 98% 100%  100%  Weight:   74 kg   Height:       Filed Weights   03/06/21 0316 03/06/21 0535 03/07/21 0510  Weight: 73 kg 71.6 kg 74 kg   Body mass index is 24.81 kg/m.   Exam  Awake Alert, No new F.N deficits, Normal affect [place].AT,PERRAL Supple Neck,No JVD, No cervical lymphadenopathy appriciated.  Symmetrical Chest wall movement, Good air movement bilaterally, CTAB RRR,No Gallops, Rubs or new Murmurs, No Parasternal Heave +ve B.Sounds, Abd Soft, No tenderness, No organomegaly appriciated, No rebound - guarding or rigidity. No Cyanosis, Clubbing or edema, No new Rash or bruise   I have personally reviewed following labs and imaging studies  LABORATORY DATA:  Recent Labs  Lab 02/28/21 2042 02/28/21 2051 03/04/21 0600 03/05/21 0043  03/06/21 0010 03/07/21 0013 03/07/21 0759  WBC 4.4   < > 5.5 5.1 6.1 6.1 5.8  HGB 6.7*   < > 9.5* 7.9* 9.0* 7.1* 7.9*  HCT 22.7*   < > 31.2* 25.7* 29.4* 23.4* 26.1*  PLT 189   < > 225 188 192 183 183  MCV 95.0   < > 92.9 92.1 92.7 92.5 92.6  MCH 28.0   < > 28.3 28.3 28.4 28.1 28.0  MCHC 29.5*   < > 30.4 30.7 30.6 30.3 30.3  RDW 16.4*   < > 15.9* 15.8* 15.6* 15.5 15.5  LYMPHSABS 1.2  --  1.2 1.2 0.6* 1.3  --   MONOABS 0.5  --  0.5 0.6 0.1 0.6  --   EOSABS 0.0  --  0.1 0.1 0.0 0.1  --   BASOSABS 0.0  --  0.0 0.0 0.0 0.0  --    < > = values in this interval not displayed.    Recent Labs  Lab 02/28/21 2042 02/28/21 2051 03/01/21 2027 03/02/21 0540 03/03/21 0036 03/04/21 0600 03/04/21 0739 03/05/21 0043 03/06/21 0010 03/07/21 0013  NA 137   < >  --  139 137 137  --  136 134* 133*  K 4.6   < >  --  4.1 3.8 4.0  --  4.0 4.8 4.8  CL 108  --   --  107 102 102  --  101 98 99  CO2 24  --   --  _1 --  _2 GLUCOSE 280*  --   --  81 92 122*  --  192* 247* 174*  BUN 41*  --   --  44* 46* 44*  --  46* 50* 65*  CREATININE 2.93*  --   --  2.83* 2.70* 2.56*  --  2.96* 3.00* 3.78*  CALCIUM 8.3*  --   --  8.5* 8.5* 8.5*  --  8.2* 8.3* 8.0*  AST 74*  --   --  34 32 41  --  33 23 12*  ALT 74*  --   --  65* 66* 77*  --  64* 53* 37  ALKPHOS 58  --   --  59 53 60  --  61 58 55  BILITOT  0.7  --   --  0.8 0.9 0.7  --  0.8 0.7 0.7  ALBUMIN 2.4*  --   --  2.2* 2.3* 2.5*  --  2.2* 2.3* 2.2*  PROCALCITON  --   --  0.24 0.21  --   --  <0.10 <0.10 <0.10  --   LATICACIDVEN 1.1  --   --   --   --   --   --   --   --   --   INR 1.2  --   --   --   --   --   --   --   --   --   BNP 2,327.5*  --   --  1,340.6*  --   --   --   --   --   --    < > = values in this interval not displayed.     RADIOLOGY STUDIES/RESULTS: DG CHEST PORT 1 VIEW  Result Date: 03/05/2021 CLINICAL DATA:  Post bronchoscopy EXAM: PORTABLE CHEST 1 VIEW COMPARISON:  02/28/2021 FINDINGS: The lungs are symmetrically well  expanded. Airspace infiltrate within the right mid lung zone appears improved since prior examination, particularly when compared to remote prior examination of 01/30/2021. No pneumothorax or pleural effusion. Mild cardiomegaly is stable. Central pulmonary arteries are enlarged in keeping with changes of pulmonary arterial hypertension. No acute bone abnormality. IMPRESSION: No pneumothorax following bronchoscopy. Improving right mid lung zone pulmonary infiltrate. Stable cardiomegaly and enlargement of the central pulmonary arteries. Electronically Signed   By: Fidela Salisbury M.D.   On: 03/05/2021 16:34   DG Abd Portable 1V  Result Date: 03/07/2021 CLINICAL DATA:  Constipation, abdominal discomfort EXAM: PORTABLE ABDOMEN - 1 VIEW COMPARISON:  None. FINDINGS: There is a nonobstructive bowel gas pattern. There is a large stool burden throughout the colon. There is no gross organomegaly or abnormal soft tissue calcification. The heart is enlarged. The lung bases are clear. There is degenerative change in the lower lumbar spine. IMPRESSION: Large colonic stool burden without evidence of mechanical obstruction. Electronically Signed   By: Valetta Mole M.D.   On: 03/07/2021 08:33   DG C-ARM BRONCHOSCOPY  Result Date: 03/05/2021 C-ARM BRONCHOSCOPY: Fluoroscopy was utilized by the requesting physician.  No radiographic interpretation.     LOS: 7 days   Signature  Lala Lund M.D on 03/07/2021 at 9:10 AM   -  To page go to [email or web address]    03/07/2021, 9:10 AM

## 2021-03-08 LAB — CBC WITH DIFFERENTIAL/PLATELET
Abs Immature Granulocytes: 0.01 10*3/uL (ref 0.00–0.07)
Basophils Absolute: 0 10*3/uL (ref 0.0–0.1)
Basophils Relative: 0 %
Eosinophils Absolute: 0.1 10*3/uL (ref 0.0–0.5)
Eosinophils Relative: 2 %
HCT: 24.4 % — ABNORMAL LOW (ref 39.0–52.0)
Hemoglobin: 7.3 g/dL — ABNORMAL LOW (ref 13.0–17.0)
Immature Granulocytes: 0 %
Lymphocytes Relative: 23 %
Lymphs Abs: 1.3 10*3/uL (ref 0.7–4.0)
MCH: 27.9 pg (ref 26.0–34.0)
MCHC: 29.9 g/dL — ABNORMAL LOW (ref 30.0–36.0)
MCV: 93.1 fL (ref 80.0–100.0)
Monocytes Absolute: 0.6 10*3/uL (ref 0.1–1.0)
Monocytes Relative: 10 %
Neutro Abs: 3.6 10*3/uL (ref 1.7–7.7)
Neutrophils Relative %: 65 %
Platelets: 179 10*3/uL (ref 150–400)
RBC: 2.62 MIL/uL — ABNORMAL LOW (ref 4.22–5.81)
RDW: 15.6 % — ABNORMAL HIGH (ref 11.5–15.5)
WBC: 5.5 10*3/uL (ref 4.0–10.5)
nRBC: 0 % (ref 0.0–0.2)

## 2021-03-08 LAB — COMPREHENSIVE METABOLIC PANEL
ALT: 27 U/L (ref 0–44)
AST: 13 U/L — ABNORMAL LOW (ref 15–41)
Albumin: 2.3 g/dL — ABNORMAL LOW (ref 3.5–5.0)
Alkaline Phosphatase: 54 U/L (ref 38–126)
Anion gap: 3 — ABNORMAL LOW (ref 5–15)
BUN: 68 mg/dL — ABNORMAL HIGH (ref 8–23)
CO2: 32 mmol/L (ref 22–32)
Calcium: 8.2 mg/dL — ABNORMAL LOW (ref 8.9–10.3)
Chloride: 100 mmol/L (ref 98–111)
Creatinine, Ser: 3.52 mg/dL — ABNORMAL HIGH (ref 0.61–1.24)
GFR, Estimated: 18 mL/min — ABNORMAL LOW (ref >60)
Glucose, Bld: 180 mg/dL — ABNORMAL HIGH (ref 70–99)
Potassium: 5.3 mmol/L — ABNORMAL HIGH (ref 3.5–5.1)
Sodium: 135 mmol/L (ref 135–145)
Total Bilirubin: 0.8 mg/dL (ref 0.3–1.2)
Total Protein: 5.8 g/dL — ABNORMAL LOW (ref 6.5–8.1)

## 2021-03-08 LAB — GLUCOSE, CAPILLARY
Glucose-Capillary: 135 mg/dL — ABNORMAL HIGH (ref 70–99)
Glucose-Capillary: 155 mg/dL — ABNORMAL HIGH (ref 70–99)
Glucose-Capillary: 101 mg/dL — ABNORMAL HIGH (ref 70–99)
Glucose-Capillary: 139 mg/dL — ABNORMAL HIGH (ref 70–99)

## 2021-03-08 LAB — MAGNESIUM: Magnesium: 2.4 mg/dL (ref 1.7–2.4)

## 2021-03-08 LAB — ANA W/REFLEX IF POSITIVE: Anti Nuclear Antibody (ANA): NEGATIVE

## 2021-03-08 MED ORDER — POLYETHYLENE GLYCOL 3350 17 G PO PACK: 17.0000 g | PACK | Freq: Two times a day (BID) | ORAL | Status: DC

## 2021-03-08 MED ORDER — PEG 3350-KCL-NA BICARB-NACL 420 G PO SOLR
1000.0000 mL | Freq: Once | ORAL | Status: AC
  Administered 2021-03-08: 1000 mL via ORAL
  Filled 2021-03-08 (×2): qty 4000

## 2021-03-08 MED ORDER — LACTATED RINGERS IV SOLN: INTRAVENOUS | Status: AC

## 2021-03-08 MED ORDER — SODIUM ZIRCONIUM CYCLOSILICATE 10 G PO PACK
10.0000 g | PACK | Freq: Two times a day (BID) | ORAL | Status: AC
  Administered 2021-03-08 (×2): 10 g via ORAL
  Filled 2021-03-08 (×2): qty 1

## 2021-03-08 MED ORDER — DOCUSATE SODIUM 100 MG PO CAPS
200.0000 mg | ORAL_CAPSULE | Freq: Two times a day (BID) | ORAL | Status: DC
  Administered 2021-03-08 – 2021-03-10 (×4): 200 mg via ORAL
  Filled 2021-03-08 (×4): qty 2

## 2021-03-08 MED ORDER — LACTULOSE 10 GM/15ML PO SOLN
30.0000 g | Freq: Three times a day (TID) | ORAL | Status: DC
  Administered 2021-03-08 (×3): 30 g via ORAL
  Filled 2021-03-08 (×3): qty 45

## 2021-03-08 MED ORDER — BISACODYL 10 MG RE SUPP
10.0000 mg | Freq: Every day | RECTAL | Status: DC
  Filled 2021-03-08: qty 1

## 2021-03-08 NOTE — Progress Notes (Signed)
PROGRESS NOTE        PATIENT DETAILS Name: Robert Chaney Age: 70 y.o. Sex: male Date of Birth: [birth year] Admit Date: 02/28/2021 Admitting Physician Vianne Bulls, MD AVW:[id number], Christean Grief, MD  Brief Narrative: Patient is a 70 y.o. male with history of CKD stage IV, HOCM, HFpEF, IDDM, HTN, OSA on CPAP, persistent RML infiltrate on chest imaging, chronic hypoxic respiratory failure on 3-4 L of oxygen at home-presented with worsening shortness of breath, chills, dark stools and fatigue.  He was found to have acute on chronic hypoxic respiratory failure due to a combination of right middle lobe infiltrate/HFpEF with exacerbation and worsening of his baseline hemoglobin.  He was subsequently admitted to the hospitalist service.  See below for further details.  Significant events: 9/7>> admit for shortness of breath/fever/black stools-worsening hypoxemia-imaging with persistent right middle lobe infiltrate-on BiPAP-started on antibiotics/Lasix and PRBC transfusion.   9/8>> liberated off BiPAP-placed on O2 3-4 L  Significant studies: 8/9>> Echo: EF 70-75% 9/7>> CXR: Right midlung airspace disease worrisome for pneumonia.  Antimicrobial therapy: Vancomycin: 9/7>> 9/9 Cefepime: 9/7>> 9/9 Unasyn: 9/9>>  Microbiology data: 9/7>> COVID/influenza PCR: Negative 9/7>> blood culture: No growth  Procedures : EGD done 03/03/21 - mild gastritis. RUL Posterior subsegment BAL/Brushing/Tbbx all sent for culture and cytology >> 03/05/21  Consults: Cardiology, PCCM, GI  DVT Prophylaxis :  Place and maintain sequential compression device Start: 03/03/21 0724 SCDs Start: 02/28/21 2322   Subjective: Patient in bed, appears comfortable, denies any headache, no fever, no chest pain or pressure, no shortness of breath , no abdominal pain. No new focal weakness, no BM yet.    Assessment/Plan:  Acute on chronic hypoxic respiratory failure: Secondary to HFpEF exacerbation and  postobstructive pneumonia.  Initially on BiPAP-liberated of the BiPAP at 9/8.  He is back on his usual regimen of 3-4 L of oxygen.  Continue diuretics PRN.  Much improved, will try and advance activity and titrate down oxygen.  Persistent right middle lobe infiltrate likely postobstructive pneumonia: Suspicion for a endobronchial lesion -he underwent bronchoscopy with biopsy on 03/05/2021, pending cytology and cultures, post discharge follow-up with Dr. [name] in 1 to 2 weeks, stopped vancomycin/cefepime-switched to Unasyn.  Serum Quanteferon levels negative.  HFpEF with exacerbation: Cardiology following appears slightly over diuresed on 03/07/2021, diuretics and blood pressure medications held, will monitor closely,  Need to be cautious with preload reduction given history of HOCM.    ?  Upper GI bleed: Apparently has a black stools for the past several weeks-no BM since admission.  Since his respiratory status is stabilized-have consulted GI.  Continue PPI. EGD done 03/03/21 - mild gastritis.  Constipation.  Placed on bowel regimen.  Soapsuds enema x1 on 03/07/2021, still no BM, regimen escalated.  Elevated troponins: Probably related demand ischemia in the setting of anemia/respiratory failure.  Appreciate cardiology input.  Due to concern for GI bleeding/severity of anemia-hold off on antiplatelets for now.  HOCM: Watch closely-minimize preload reduction is much as possible.  Resume beta-blocker.  Anemia: Multifactorial-has anemia related to CKD at baseline-question of GI bleeding and acute blood loss as well.  Hemoglobin stable after 1 unit of PRBC on admission-continue to watch closely.    HTN: BP currently low on 03/07/2021, medications adjusted.  Will monitor.  Gradually reintroduce as needed.  AKI on CKD stage IV: Baseline creatinine around 2.9, hold further  diuretics, stabilize blood pressure and monitor.  Has been gently hydrated on 03/06/2021, will repeat IVF 03/08/21.  History of  renal artery stenosis.  Supportive care monitor blood pressure.  OSA: On CPAP nightly  DM-2 (A1c 5.8 on 7/22): CBG low am 03/03/21, Insulin adjusted, will monitor   Recent Labs    03/07/21 1626 03/07/21 2047 03/08/21 0630  GLUCAP 176* 163* 135*     Diet: Diet Order             DIET SOFT Room service appropriate? Yes; Fluid consistency: Nectar Thick  Diet effective now                    Code Status: Full code   Family Communication:  None at bedside  Disposition Plan: Status is: Inpatient  Remains inpatient appropriate because:Inpatient level of care appropriate due to severity of illness  Dispo: The patient is from: Home              Anticipated d/c is to: Home              Patient currently is not medically stable to d/c.   Difficult to place patient No   Barriers to Discharge: On BiPAP-on IV antibiotics/IV Lasix-not yet stable for discharge.  Antimicrobial agents: Anti-infectives (From admission, onward)    Start     Dose/Rate Route Frequency Ordered Stop   03/02/21 1400  Ampicillin-Sulbactam (UNASYN) 3 g in sodium chloride 0.9 % 100 mL IVPB  Status:  Discontinued        3 g 200 mL/hr over 30 Minutes Intravenous Every 12 hours 03/02/21 1314 03/05/21 0834   03/01/21 2300  vancomycin (VANCOREADY) IVPB 750 mg/150 mL  Status:  Discontinued        750 mg 150 mL/hr over 60 Minutes Intravenous Every 24 hours 02/28/21 2347 03/02/21 1258   03/01/21 0000  vancomycin (VANCOREADY) IVPB 1500 mg/300 mL        1,500 mg 150 mL/hr over 120 Minutes Intravenous  Once 02/28/21 2347 03/01/21 0215   02/28/21 2345  ceFEPIme (MAXIPIME) 2 g in sodium chloride 0.9 % 100 mL IVPB  Status:  Discontinued       Note to Pharmacy: Cefepime 2 g IV q24h for CrCl < 30 mL/min   2 g 200 mL/hr over 30 Minutes Intravenous Daily at bedtime 02/28/21 2326 03/02/21 1258   02/28/21 2230  doxycycline (VIBRAMYCIN) 100 mg in sodium chloride 0.9 % 250 mL IVPB        100 mg 125 mL/hr over 120 Minutes  Intravenous  Once 02/28/21 2219 03/01/21 0050        Time spent:35 minutes-Greater than 50% of this time was spent in counseling, explanation of diagnosis, planning of further management, and coordination of care.  MEDICATIONS: Scheduled Meds:  sodium chloride   Intravenous Once   atorvastatin  20 mg Oral Daily   bisacodyl  10 mg Rectal Daily   brimonidine  1 drop Right Eye BID   docusate sodium  200 mg Oral BID   dorzolamide  1 drop Both Eyes BID   hydrALAZINE  50 mg Oral Q8H   insulin aspart  0-9 Units Subcutaneous TID WC   insulin aspart  2 Units Subcutaneous TID WC   insulin glargine-yfgn  12 Units Subcutaneous QHS   isosorbide mononitrate  30 mg Oral Daily   lactulose  30 g Oral TID   latanoprost  1 drop Left Eye QHS   pantoprazole (PROTONIX) IV  40  mg Intravenous Q12H   pneumococcal 23 valent vaccine  0.5 mL Intramuscular Tomorrow-1000   polyethylene glycol-electrolytes  1,000 mL Oral Once   sodium zirconium cyclosilicate  10 g Oral BID   Continuous Infusions:  lactated ringers      PRN Meds:.acetaminophen **OR** [DISCONTINUED] acetaminophen, ondansetron (ZOFRAN) IV   PHYSICAL EXAM: Vital signs: Vitals:   03/07/21 2012 03/08/21 0000 03/08/21 0517 03/08/21 0700  BP: (!) 132/58 137/67  (!) 145/60  Pulse: 67 66  68  Resp: _0 Temp: 97.7 F (36.5 C) 98.7 F (37.1 C)  98.4 F (36.9 C)  TempSrc: Oral Oral  Oral  SpO2:  94%  100%  Weight:   74.1 kg   Height:       Filed Weights   03/06/21 0535 03/07/21 0510 03/08/21 0517  Weight: 71.6 kg 74 kg 74.1 kg   Body mass index is 24.84 kg/m.   Exam  Awake Alert, No new F.N deficits, Normal affect [place].AT,PERRAL Supple Neck,No JVD, No cervical lymphadenopathy appriciated.  Symmetrical Chest wall movement, Good air movement bilaterally, CTAB RRR,No Gallops, Rubs or new Murmurs, No Parasternal Heave +ve B.Sounds, Abd Soft, No tenderness, No organomegaly appriciated, No rebound - guarding or rigidity. No  Cyanosis, Clubbing or edema, No new Rash or bruise    I have personally reviewed following labs and imaging studies  LABORATORY DATA:  Recent Labs  Lab 03/04/21 0600 03/05/21 0043 03/06/21 0010 03/07/21 0013 03/07/21 0759 03/08/21 0149  WBC 5.5 5.1 6.1 6.1 5.8 5.5  HGB 9.5* 7.9* 9.0* 7.1* 7.9* 7.3*  HCT 31.2* 25.7* 29.4* 23.4* 26.1* 24.4*  PLT 225 188 192 183 183 179  MCV 92.9 92.1 92.7 92.5 92.6 93.1  MCH 28.3 28.3 28.4 28.1 28.0 27.9  MCHC 30.4 30.7 30.6 30.3 30.3 29.9*  RDW 15.9* 15.8* 15.6* 15.5 15.5 15.6*  LYMPHSABS 1.2 1.2 0.6* 1.3  --  1.3  MONOABS 0.5 0.6 0.1 0.6  --  0.6  EOSABS 0.1 0.1 0.0 0.1  --  0.1  BASOSABS 0.0 0.0 0.0 0.0  --  0.0    Recent Labs  Lab 03/01/21 2027 03/02/21 0540 03/03/21 0036 03/04/21 0600 03/04/21 0739 03/05/21 0043 03/06/21 0010 03/07/21 0013 03/07/21 1604 03/08/21 0149  NA  --  139   < > 137  --  136 134* 133*  --  135  K  --  4.1   < > 4.0  --  4.0 4.8 4.8  --  5.3*  CL  --  107   < > 102  --  101 98 99  --  100  CO2  --  25   < > 26  --  _1 --  32  GLUCOSE  --  81   < > 122*  --  192* 247* 174*  --  180*  BUN  --  44*   < > 44*  --  46* 50* 65*  --  68*  CREATININE  --  2.83*   < > 2.56*  --  2.96* 3.00* 3.78*  --  3.52*  CALCIUM  --  8.5*   < > 8.5*  --  8.2* 8.3* 8.0*  --  8.2*  AST  --  34   < > 41  --  33 23 12*  --  13*  ALT  --  65*   < > 77*  --  64* 53* 37  --  27  ALKPHOS  --  59   < > 60  --  61 58 55  --  54  BILITOT  --  0.8   < > 0.7  --  0.8 0.7 0.7  --  0.8  ALBUMIN  --  2.2*   < > 2.5*  --  2.2* 2.3* 2.2*  --  2.3*  MG  --   --   --   --   --   --   --   --   --  2.4  CRP  --   --   --   --   --   --   --   --  0.6  --   PROCALCITON 0.24 0.21  --   --  <0.10 <0.10 <0.10  --   --   --   BNP  --  1,340.6*  --   --   --   --   --   --   --   --    < > = values in this interval not displayed.     RADIOLOGY STUDIES/RESULTS: CT CHEST WO CONTRAST  Result Date: 03/07/2021 CLINICAL DATA:  Pneumonia  follow-up. EXAM: CT CHEST WITHOUT CONTRAST TECHNIQUE: Multidetector CT imaging of the chest was performed following the standard protocol without IV contrast. COMPARISON:  CT chest dated January 30, 2021. FINDINGS: Cardiovascular: Stable cardiomegaly. No pericardial effusion. No thoracic aortic aneurysm. Coronary, aortic arch, and branch vessel atherosclerotic vascular disease. Mediastinum/Nodes: No enlarged mediastinal or axillary lymph nodes. Unchanged 1.6 cm hypodense nodule in the left thyroid lobe. The trachea and esophagus demonstrate no significant findings. Lungs/Pleura: Unchanged small right greater than left pleural effusions. Resolved interlobular septal thickening and layering ground-glass densities in both lungs. Significantly improved consolidation in the right upper lobe with some persistent airspace disease posteriorly. Mildly worsened atelectasis in the right greater than left lower lobes. Unchanged no pneumothorax. Upper Abdomen: No acute abnormality. Unchanged symmetric bilateral adrenal gland thickening. Musculoskeletal: No acute or significant osseous findings. IMPRESSION: 1. Significantly improved consolidation in the right upper lobe with some persistent airspace disease posteriorly, consistent with resolving pneumonia. Continued follow-up to resolution is recommended. 2. Resolved pulmonary edema. Unchanged small right greater than left pleural effusions. 3. Mildly worsened atelectasis in the right greater than left lower lobes. 4. Unchanged 1.6 cm hypodense nodule in the left thyroid lobe. Recommend thyroid US (ref: J Am Coll Radiol. 2015 Feb;12(2): 143-50). 5. Aortic Atherosclerosis (ICD10-I70.0). Electronically Signed   By: Titus Dubin M.D.   On: 03/07/2021 15:01   DG Abd Portable 1V  Result Date: 03/07/2021 CLINICAL DATA:  Constipation, abdominal discomfort EXAM: PORTABLE ABDOMEN - 1 VIEW COMPARISON:  None. FINDINGS: There is a nonobstructive bowel gas pattern. There is a large stool  burden throughout the colon. There is no gross organomegaly or abnormal soft tissue calcification. The heart is enlarged. The lung bases are clear. There is degenerative change in the lower lumbar spine. IMPRESSION: Large colonic stool burden without evidence of mechanical obstruction. Electronically Signed   By: Valetta Mole M.D.   On: 03/07/2021 08:33     LOS: 8 days   Signature  Lala Lund M.D on 03/08/2021 at 8:40 AM   -  To page go to [email or web address]    03/08/2021, 8:40 AM
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
## 2021-03-08 NOTE — Progress Notes (Signed)
Patient was able to rest well tonight. Patient did receive scheduled miralax and colace with no results. No complaints of discomfort due to constipation.
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
## 2021-03-08 NOTE — Progress Notes (Signed)
Patient had large bowel movement. Stool was brown with red blood. Patient states his stomach feels much better.

## 2021-03-09 ENCOUNTER — Encounter (INDEPENDENT_AMBULATORY_CARE_PROVIDER_SITE_OTHER): Payer: PPO | Admitting: Ophthalmology

## 2021-03-09 DIAGNOSIS — H3581 Retinal edema: Secondary | ICD-10-CM

## 2021-03-09 LAB — COMPREHENSIVE METABOLIC PANEL
ALT: 29 U/L (ref 0–44)
AST: 18 U/L (ref 15–41)
Albumin: 2.7 g/dL — ABNORMAL LOW (ref 3.5–5.0)
Alkaline Phosphatase: 57 U/L (ref 38–126)
Anion gap: 6 (ref 5–15)
BUN: 51 mg/dL — ABNORMAL HIGH (ref 8–23)
CO2: 29 mmol/L (ref 22–32)
Calcium: 8.9 mg/dL (ref 8.9–10.3)
Chloride: 101 mmol/L (ref 98–111)
Creatinine, Ser: 2.53 mg/dL — ABNORMAL HIGH (ref 0.61–1.24)
GFR, Estimated: 26 mL/min — ABNORMAL LOW (ref >60)
Glucose, Bld: 137 mg/dL — ABNORMAL HIGH (ref 70–99)
Potassium: 4.2 mmol/L (ref 3.5–5.1)
Sodium: 136 mmol/L (ref 135–145)
Total Bilirubin: 0.8 mg/dL (ref 0.3–1.2)
Total Protein: 6.7 g/dL (ref 6.5–8.1)

## 2021-03-09 LAB — CBC WITH DIFFERENTIAL/PLATELET
Abs Immature Granulocytes: 0.02 10*3/uL (ref 0.00–0.07)
Basophils Absolute: 0 10*3/uL (ref 0.0–0.1)
Basophils Relative: 0 %
Eosinophils Absolute: 0.1 10*3/uL (ref 0.0–0.5)
Eosinophils Relative: 1 %
HCT: 28.1 % — ABNORMAL LOW (ref 39.0–52.0)
Hemoglobin: 8.6 g/dL — ABNORMAL LOW (ref 13.0–17.0)
Immature Granulocytes: 0 %
Lymphocytes Relative: 18 %
Lymphs Abs: 1.1 10*3/uL (ref 0.7–4.0)
MCH: 28.2 pg (ref 26.0–34.0)
MCHC: 30.6 g/dL (ref 30.0–36.0)
MCV: 92.1 fL (ref 80.0–100.0)
Monocytes Absolute: 0.6 10*3/uL (ref 0.1–1.0)
Monocytes Relative: 9 %
Neutro Abs: 4.3 10*3/uL (ref 1.7–7.7)
Neutrophils Relative %: 72 %
Platelets: 192 10*3/uL (ref 150–400)
RBC: 3.05 MIL/uL — ABNORMAL LOW (ref 4.22–5.81)
RDW: 15.8 % — ABNORMAL HIGH (ref 11.5–15.5)
WBC: 6.1 10*3/uL (ref 4.0–10.5)
nRBC: 0 % (ref 0.0–0.2)

## 2021-03-09 LAB — GLUCOSE, CAPILLARY
Glucose-Capillary: 63 mg/dL — ABNORMAL LOW (ref 70–99)
Glucose-Capillary: 77 mg/dL (ref 70–99)
Glucose-Capillary: 99 mg/dL (ref 70–99)
Glucose-Capillary: 148 mg/dL — ABNORMAL HIGH (ref 70–99)
Glucose-Capillary: 156 mg/dL — ABNORMAL HIGH (ref 70–99)

## 2021-03-09 LAB — SURGICAL PATHOLOGY

## 2021-03-09 LAB — MAGNESIUM: Magnesium: 2.7 mg/dL — ABNORMAL HIGH (ref 1.7–2.4)

## 2021-03-09 LAB — ANCA TITERS
Atypical P-ANCA titer: <1:20 {titer}
C-ANCA: <1:20 {titer}
P-ANCA: <1:20 {titer}

## 2021-03-09 MED ORDER — BISACODYL 10 MG RE SUPP
10.0000 mg | Freq: Once | RECTAL | Status: AC
  Administered 2021-03-09: 10 mg via RECTAL
  Filled 2021-03-09: qty 1

## 2021-03-09 MED ORDER — CARVEDILOL 3.125 MG PO TABS
3.1250 mg | ORAL_TABLET | Freq: Two times a day (BID) | ORAL | Status: DC
  Administered 2021-03-09 – 2021-03-10 (×2): 3.125 mg via ORAL
  Filled 2021-03-09 (×2): qty 1

## 2021-03-09 MED ORDER — INSULIN GLARGINE-YFGN 100 UNIT/ML ~~LOC~~ SOLN
8.0000 [IU] | Freq: Every day | SUBCUTANEOUS | Status: DC
  Administered 2021-03-09: 8 [IU] via SUBCUTANEOUS
  Filled 2021-03-09 (×2): qty 0.08

## 2021-03-09 MED ORDER — LACTULOSE 10 GM/15ML PO SOLN
30.0000 g | Freq: Once | ORAL | Status: AC
  Administered 2021-03-09: 30 g via ORAL
  Filled 2021-03-09: qty 45

## 2021-03-09 MED ORDER — PEG 3350-KCL-NA BICARB-NACL 420 G PO SOLR
1000.0000 mL | Freq: Once | ORAL | Status: DC
  Filled 2021-03-09: qty 4000

## 2021-03-09 MED ORDER — DOXAZOSIN MESYLATE 4 MG PO TABS
2.0000 mg | ORAL_TABLET | Freq: Every day | ORAL | Status: DC
  Administered 2021-03-09 – 2021-03-10 (×2): 2 mg via ORAL
  Filled 2021-03-09 (×3): qty 1

## 2021-03-09 MED ORDER — AMLODIPINE BESYLATE 10 MG PO TABS
10.0000 mg | ORAL_TABLET | Freq: Every day | ORAL | Status: DC
  Administered 2021-03-09: 10 mg via ORAL
  Filled 2021-03-09: qty 1

## 2021-03-09 NOTE — TOC Transition Note (Addendum)
Transition of Care [facility]) - CM/SW Discharge Note   Patient Details  Name: Robert Chaney MRN: [id number] Date of Birth: [birth year]  [facility]) CM/SW Contact:  Zenon Mayo, RN Phone Number: 03/09/2021, 8:59 AM   Clinical Narrative:    Patient is for dc today, NCM spoke with patient at bedside, he would like to have a walker.  He states he has [facility] that comes out once a month to check on him, he does not need HHRN. PT eval states he does not need PT follow up and patient agrees.  He is indep and so does not need [place] services. He was asking if someone could come out to help clean his home and run errands. NCM informed him he will need to go thru private duty care which he will have to pay himself.  He has home oxygen with [employer] and will need a tank to go home with.  [employer] will bring the tank to room prior to dc.   Final next level of care: Home/Self Care Barriers to Discharge: No Barriers Identified   Patient Goals and CMS Choice Patient states their goals for this hospitalization and ongoing recovery are:: return home   Choice offered to / list presented to : NA  Discharge Placement                       Discharge Plan and Services   Discharge Planning Services: CM Consult              DME Agency: NA       HH Arranged: NA          Social Determinants of Health (SDOH) Interventions     Readmission Risk Interventions Readmission Risk Prevention Plan 03/05/2021 05/12/2020  Transportation Screening Complete Complete  PCP or Specialist Appt within 3-5 Days Complete Complete  HRI or [place] Complete Complete  Social Work Consult for [place] Planning/Counseling Complete Complete  Palliative Care Screening Not Applicable Not Applicable  Medication Review Press photographer) Complete Complete  Some recent data might be hidden

## 2021-03-09 NOTE — Discharge Instructions (Addendum)
Follow with Primary MD [name], MD in 7 days   Get CBC, CMP, 2 view Chest X ray -  checked next visit within 1 week by Primary MD  Activity: As tolerated with Full fall precautions use walker/cane & assistance as needed  Disposition Home    Diet: Heart Healthy - Low Carb, Check your Weight same time everyday, if you gain over 2 pounds, or you develop in leg swelling, experience more shortness of breath or chest pain, call your Primary MD immediately. Follow Cardiac Low Salt Diet and 1.5 lit/day fluid restriction.  Accuchecks 4 times/day, Once in AM empty stomach and then before each meal. Log in all results and show them to your Prim.MD in 3 days. If any glucose reading is under 80 or above 300 call your Prim MD immidiately. Follow Low glucose instructions for glucose under 80 as instructed.   Special Instructions: If you have smoked or chewed Tobacco  in the last 2 yrs please stop smoking, stop any regular Alcohol  and or any Recreational drug use.  On your next visit with your primary care physician please Get Medicines reviewed and adjusted.  Please request your Prim.MD to go over all Hospital Tests and Procedure/Radiological results at the follow up, please get all Hospital records sent to your Prim MD by signing hospital release before you go home.  If you experience worsening of your admission symptoms, develop shortness of breath, life threatening emergency, suicidal or homicidal thoughts you must seek medical attention immediately by calling 911 or calling your MD immediately  if symptoms less severe.  You Must read complete instructions/literature along with all the possible adverse reactions/side effects for all the Medicines you take and that have been prescribed to you. Take any new Medicines after you have completely understood and accpet all the possible adverse reactions/side effects.

## 2021-03-09 NOTE — Progress Notes (Signed)
PROGRESS NOTE        PATIENT DETAILS Name: Robert Chaney Age: 70 y.o. Sex: male Date of Birth: May 30, 1951 Admit Date: 02/28/2021 Admitting Physician Vianne Bulls, MD LFY:[id number], Christean Grief, MD  Brief Narrative: Patient is a 70 y.o. male with history of CKD stage IV, HOCM, HFpEF, IDDM, HTN, OSA on CPAP, persistent RML infiltrate on chest imaging, chronic hypoxic respiratory failure on 3-4 L of oxygen at home-presented with worsening shortness of breath, chills, dark stools and fatigue.  He was found to have acute on chronic hypoxic respiratory failure due to a combination of right middle lobe infiltrate/HFpEF with exacerbation and worsening of his baseline hemoglobin.  He was subsequently admitted to the hospitalist service.  See below for further details.  Significant events: 9/7>> admit for shortness of breath/fever/black stools-worsening hypoxemia-imaging with persistent right middle lobe infiltrate-on BiPAP-started on antibiotics/Lasix and PRBC transfusion.   9/8>> liberated off BiPAP-placed on O2 3-4 L  Significant studies: 8/9>> Echo: EF 70-75% 9/7>> CXR: Right midlung airspace disease worrisome for pneumonia.  Antimicrobial therapy: Vancomycin: 9/7>> 9/9 Cefepime: 9/7>> 9/9 Unasyn: 9/9>>  Microbiology data: 9/7>> COVID/influenza PCR: Negative 9/7>> blood culture: No growth  Procedures : EGD done 03/03/21 - mild gastritis. RUL Posterior subsegment BAL/Brushing/Tbbx all sent for culture and cytology >> 03/05/21  Consults: Cardiology, PCCM, GI  DVT Prophylaxis :  Place and maintain sequential compression device Start: 03/03/21 0724 SCDs Start: 02/28/21 2322   Subjective: Patient in bed, appears comfortable, denies any headache, no fever, no chest pain or pressure, no shortness of breath , no abdominal pain, had a large BM. No new focal weakness.   Assessment/Plan:  Acute on chronic hypoxic respiratory failure: Secondary to HFpEF exacerbation and  postobstructive pneumonia.  Initially on BiPAP-liberated of the BiPAP at 9/8.  He is back on his usual regimen of 3-4 L of oxygen.  Continue diuretics PRN.  Much improved, will try and advance activity and titrate down oxygen.  Persistent right middle lobe infiltrate likely postobstructive pneumonia: Suspicion for a endobronchial lesion -he underwent bronchoscopy with biopsy on 03/05/2021, non malignant - reactive cytology, post discharge follow-up with Dr. [name] in 1 to 2 weeks, stopped vancomycin/cefepime-switched to Unasyn.  Serum Quanteferon levels negative.  HFpEF with exacerbation: Cardiology following appears slightly over diuresed on 03/07/2021, diuretics and blood pressure medications held, will monitor closely,  Need to be cautious with preload reduction given history of HOCM.    ?  Upper GI bleed: Apparently has a black stools for the past several weeks-no BM since admission.  Since his respiratory status is stabilized-have consulted GI.  Continue PPI. EGD done 03/03/21 - mild gastritis.  Constipation.  Better after aggressive bowel regimen, continue, 1 BM in 8 days.  Elevated troponins: Probably related demand ischemia in the setting of anemia/respiratory failure.  Appreciate cardiology input.  Due to concern for GI bleeding/severity of anemia-hold off on antiplatelets for now.  HOCM: Watch closely-minimize preload reduction is much as possible.  Resume beta-blocker.  Anemia: Multifactorial-has anemia related to CKD at baseline-question of GI bleeding and acute blood loss as well.  Hemoglobin stable after 1 unit of PRBC on admission-continue to watch closely.    HTN: BP improving, Meds adjusted further on 03/09/21.  AKI on CKD stage IV: Baseline creatinine around 2.9, hold further diuretics, BP Meds lowered, IVF on 03/08/21 - better.  History  of renal artery stenosis.  Supportive care monitor blood pressure.  OSA: On CPAP nightly  DM-2 (A1c 5.8 on 7/22): CBG low am 03/03/21,  Insulin adjusted on 03/09/21, will monitor   Recent Labs    03/08/21 2103 03/09/21 0618 03/09/21 0649  GLUCAP 139* 63* 77     Diet: Diet Order             Diet heart healthy/carb modified Room service appropriate? Yes; Fluid consistency: Thin  Diet effective now                    Code Status: Full code   Family Communication:  None at bedside  Disposition Plan: Status is: Inpatient  Remains inpatient appropriate because:Inpatient level of care appropriate due to severity of illness  Dispo: The patient is from: Home              Anticipated d/c is to: Home              Patient currently is not medically stable to d/c.   Difficult to place patient No   Barriers to Discharge: On BiPAP-on IV antibiotics/IV Lasix-not yet stable for discharge.  Antimicrobial agents: Anti-infectives (From admission, onward)    Start     Dose/Rate Route Frequency Ordered Stop   03/02/21 1400  Ampicillin-Sulbactam (UNASYN) 3 g in sodium chloride 0.9 % 100 mL IVPB  Status:  Discontinued        3 g 200 mL/hr over 30 Minutes Intravenous Every 12 hours 03/02/21 1314 03/05/21 0834   03/01/21 2300  vancomycin (VANCOREADY) IVPB 750 mg/150 mL  Status:  Discontinued        750 mg 150 mL/hr over 60 Minutes Intravenous Every 24 hours 02/28/21 2347 03/02/21 1258   03/01/21 0000  vancomycin (VANCOREADY) IVPB 1500 mg/300 mL        1,500 mg 150 mL/hr over 120 Minutes Intravenous  Once 02/28/21 2347 03/01/21 0215   02/28/21 2345  ceFEPIme (MAXIPIME) 2 g in sodium chloride 0.9 % 100 mL IVPB  Status:  Discontinued       Note to Pharmacy: Cefepime 2 g IV q24h for CrCl < 30 mL/min   2 g 200 mL/hr over 30 Minutes Intravenous Daily at bedtime 02/28/21 2326 03/02/21 1258   02/28/21 2230  doxycycline (VIBRAMYCIN) 100 mg in sodium chloride 0.9 % 250 mL IVPB        100 mg 125 mL/hr over 120 Minutes Intravenous  Once 02/28/21 2219 03/01/21 0050        Time spent:35 minutes-Greater than 50% of this time  was spent in counseling, explanation of diagnosis, planning of further management, and coordination of care.  MEDICATIONS: Scheduled Meds:  sodium chloride   Intravenous Once   amLODipine  10 mg Oral Daily   atorvastatin  20 mg Oral Daily   bisacodyl  10 mg Rectal Daily   brimonidine  1 drop Right Eye BID   carvedilol  3.125 mg Oral BID WC   docusate sodium  200 mg Oral BID   dorzolamide  1 drop Both Eyes BID   doxazosin  2 mg Oral Daily   hydrALAZINE  50 mg Oral Q8H   insulin aspart  0-9 Units Subcutaneous TID WC   insulin glargine-yfgn  8 Units Subcutaneous QHS   isosorbide mononitrate  30 mg Oral Daily   lactulose  30 g Oral TID   latanoprost  1 drop Left Eye QHS   pantoprazole (PROTONIX) IV  40 mg  Intravenous Q12H   pneumococcal 23 valent vaccine  0.5 mL Intramuscular Tomorrow-1000   Continuous Infusions:    PRN Meds:.acetaminophen **OR** [DISCONTINUED] acetaminophen, ondansetron (ZOFRAN) IV   PHYSICAL EXAM: Vital signs: Vitals:   03/09/21 0336 03/09/21 0539 03/09/21 0540 03/09/21 0719  BP: (!) 172/79  (!) 163/80 (!) 167/74  Pulse: 72   73  Resp: 16   20  Temp: 98.4 F (36.9 C)   98.7 F (37.1 C)  TempSrc: Oral   Oral  SpO2: 99%   93%  Weight:  73.1 kg    Height:       Filed Weights   03/07/21 0510 03/08/21 0517 03/09/21 0539  Weight: 74 kg 74.1 kg 73.1 kg   Body mass index is 24.5 kg/m.   Exam  Awake Alert, No new F.N deficits, Normal affect [place].AT,PERRAL Supple Neck,No JVD, No cervical lymphadenopathy appriciated.  Symmetrical Chest wall movement, Good air movement bilaterally, CTAB RRR,No Gallops, Rubs or new Murmurs, No Parasternal Heave +ve B.Sounds, Abd Soft, No tenderness, No organomegaly appriciated, No rebound - guarding or rigidity. No Cyanosis, Clubbing or edema, No new Rash or bruise     I have personally reviewed following labs and imaging studies  LABORATORY DATA:  Recent Labs  Lab 03/05/21 0043 03/06/21 0010 03/07/21 0013  03/07/21 0759 03/08/21 0149 03/09/21 0035  WBC 5.1 6.1 6.1 5.8 5.5 6.1  HGB 7.9* 9.0* 7.1* 7.9* 7.3* 8.6*  HCT 25.7* 29.4* 23.4* 26.1* 24.4* 28.1*  PLT 188 192 183 183 179 192  MCV 92.1 92.7 92.5 92.6 93.1 92.1  MCH 28.3 28.4 28.1 28.0 27.9 28.2  MCHC 30.7 30.6 30.3 30.3 29.9* 30.6  RDW 15.8* 15.6* 15.5 15.5 15.6* 15.8*  LYMPHSABS 1.2 0.6* 1.3  --  1.3 1.1  MONOABS 0.6 0.1 0.6  --  0.6 0.6  EOSABS 0.1 0.0 0.1  --  0.1 0.1  BASOSABS 0.0 0.0 0.0  --  0.0 0.0    Recent Labs  Lab 03/04/21 0739 03/05/21 0043 03/06/21 0010 03/07/21 0013 03/07/21 1604 03/08/21 0149 03/09/21 0035  NA  --  136 134* 133*  --  135 136  K  --  4.0 4.8 4.8  --  5.3* 4.2  CL  --  101 98 99  --  100 101  CO2  --  _0 --  32 29  GLUCOSE  --  192* 247* 174*  --  180* 137*  BUN  --  46* 50* 65*  --  68* 51*  CREATININE  --  2.96* 3.00* 3.78*  --  3.52* 2.53*  CALCIUM  --  8.2* 8.3* 8.0*  --  8.2* 8.9  AST  --  33 23 12*  --  13* 18  ALT  --  64* 53* 37  --  27 29  ALKPHOS  --  61 58 55  --  54 57  BILITOT  --  0.8 0.7 0.7  --  0.8 0.8  ALBUMIN  --  2.2* 2.3* 2.2*  --  2.3* 2.7*  MG  --   --   --   --   --  2.4 2.7*  CRP  --   --   --   --  0.6  --   --   PROCALCITON <0.10 <0.10 <0.10  --   --   --   --      RADIOLOGY STUDIES/RESULTS: CT CHEST WO CONTRAST  Result Date: 03/07/2021 CLINICAL DATA:  Pneumonia follow-up. EXAM:  CT CHEST WITHOUT CONTRAST TECHNIQUE: Multidetector CT imaging of the chest was performed following the standard protocol without IV contrast. COMPARISON:  CT chest dated January 30, 2021. FINDINGS: Cardiovascular: Stable cardiomegaly. No pericardial effusion. No thoracic aortic aneurysm. Coronary, aortic arch, and branch vessel atherosclerotic vascular disease. Mediastinum/Nodes: No enlarged mediastinal or axillary lymph nodes. Unchanged 1.6 cm hypodense nodule in the left thyroid lobe. The trachea and esophagus demonstrate no significant findings. Lungs/Pleura: Unchanged small  right greater than left pleural effusions. Resolved interlobular septal thickening and layering ground-glass densities in both lungs. Significantly improved consolidation in the right upper lobe with some persistent airspace disease posteriorly. Mildly worsened atelectasis in the right greater than left lower lobes. Unchanged no pneumothorax. Upper Abdomen: No acute abnormality. Unchanged symmetric bilateral adrenal gland thickening. Musculoskeletal: No acute or significant osseous findings. IMPRESSION: 1. Significantly improved consolidation in the right upper lobe with some persistent airspace disease posteriorly, consistent with resolving pneumonia. Continued follow-up to resolution is recommended. 2. Resolved pulmonary edema. Unchanged small right greater than left pleural effusions. 3. Mildly worsened atelectasis in the right greater than left lower lobes. 4. Unchanged 1.6 cm hypodense nodule in the left thyroid lobe. Recommend thyroid US (ref: J Am Coll Radiol. 2015 Feb;12(2): 143-50). 5. Aortic Atherosclerosis (ICD10-I70.0). Electronically Signed   By: Titus Dubin M.D.   On: 03/07/2021 15:01     LOS: 9 days   Signature  Lala Lund M.D on 03/09/2021 at 8:57 AM   -  To page go to [email or web address]    03/09/2021, 8:57 AM

## 2021-03-09 NOTE — Progress Notes (Addendum)
Pt CBG 63- pt drank 240 ml of Orange Juice- will recheck cbg in 15 mins    0650- recheck cbg 77-

## 2021-03-10 LAB — AEROBIC/ANAEROBIC CULTURE W GRAM STAIN (SURGICAL/DEEP WOUND)
Culture: NO GROWTH
Culture: NO GROWTH
Culture: NO GROWTH

## 2021-03-10 LAB — GLUCOSE, CAPILLARY
Glucose-Capillary: 126 mg/dL — ABNORMAL HIGH (ref 70–99)
Glucose-Capillary: 151 mg/dL — ABNORMAL HIGH (ref 70–99)

## 2021-03-10 MED ORDER — POLYETHYLENE GLYCOL 3350 17 G PO PACK: 17.0000 g | PACK | Freq: Every day | ORAL | 0 refills | Status: AC

## 2021-03-10 MED ORDER — ISOSORBIDE MONONITRATE ER 60 MG PO TB24
60.0000 mg | ORAL_TABLET | Freq: Every day | ORAL | Status: DC
  Administered 2021-03-10: 60 mg via ORAL
  Filled 2021-03-10: qty 1

## 2021-03-10 MED ORDER — CARVEDILOL 12.5 MG PO TABS
12.5000 mg | ORAL_TABLET | Freq: Two times a day (BID) | ORAL | Status: DC
  Administered 2021-03-10: 12.5 mg via ORAL
  Filled 2021-03-10: qty 1

## 2021-03-10 MED ORDER — FUROSEMIDE 40 MG PO TABS
40.0000 mg | ORAL_TABLET | Freq: Two times a day (BID) | ORAL | Status: DC
  Administered 2021-03-10: 40 mg via ORAL
  Filled 2021-03-10: qty 1

## 2021-03-10 MED ORDER — CARVEDILOL 6.25 MG PO TABS: 6.2500 mg | ORAL_TABLET | Freq: Two times a day (BID) | ORAL | Status: DC

## 2021-03-10 MED ORDER — CARVEDILOL 12.5 MG PO TABS: 12.5000 mg | ORAL_TABLET | Freq: Two times a day (BID) | ORAL | 0 refills | Status: DC

## 2021-03-10 MED ORDER — DOCUSATE SODIUM 100 MG PO CAPS: 200.0000 mg | ORAL_CAPSULE | Freq: Two times a day (BID) | ORAL | 0 refills | Status: AC

## 2021-03-10 MED ORDER — PANTOPRAZOLE SODIUM 40 MG PO TBEC: 40.0000 mg | DELAYED_RELEASE_TABLET | Freq: Two times a day (BID) | ORAL | 2 refills | Status: AC

## 2021-03-10 MED ORDER — AMLODIPINE BESYLATE 10 MG PO TABS
10.0000 mg | ORAL_TABLET | Freq: Every day | ORAL | Status: DC
  Administered 2021-03-10: 10 mg via ORAL
  Filled 2021-03-10: qty 1

## 2021-03-10 NOTE — Discharge Summary (Signed)
Robert Chaney XBJ:[id number] DOB: May 14, 1951 DOA: 02/28/2021  PCP: [name], MD  Admit date: 02/28/2021  Discharge date: 03/10/2021  Admitted From: Home  Disposition:  Home   Recommendations for Outpatient Follow-up:   Follow up with PCP in 1-2 weeks  PCP Please obtain BMP/CBC, 2 view CXR in 1week,  (see Discharge instructions)   PCP Please follow up on the following pending results: Monitor blood pressure, CBC, CMP, magnesium levels closely.  Needs close follow-up post discharge with cardiology, pulmonary and nephrology.   Home Health: PT, per case manager Neoma Laming RN could not be arranged   Equipment/Devices: None Consultations: Cardiology, PCCM, GI Discharge Condition: Stable    CODE STATUS: Full    Diet Recommendation: Heart Healthy Low Carb - 1.5 lit/day total fluid restriction  Diet Order             Diet heart healthy/carb modified Room service appropriate? Yes; Fluid consistency: Thin  Diet effective now                    CC - SOB    Brief history of present illness from the day of admission and additional interim summary    Patient is a 70 y.o. male with history of CKD stage IV, HOCM, HFpEF, IDDM, HTN, OSA on CPAP, persistent RML infiltrate on chest imaging, chronic hypoxic respiratory failure on 3-4 L of oxygen at home-presented with worsening shortness of breath, chills, dark stools and fatigue.  He was found to have acute on chronic hypoxic respiratory failure due to a combination of right middle lobe infiltrate/HFpEF with exacerbation and worsening of his baseline hemoglobin.  He was subsequently admitted to the hospitalist service.  See below for further details.   Significant events:  9/7>> admit for shortness of breath/fever/black stools-worsening hypoxemia-imaging with persistent  right middle lobe infiltrate-on BiPAP-started on antibiotics/Lasix and PRBC transfusion.   9/8>> liberated off BiPAP-placed on O2 3-4 L   Significant studies: 8/9>> Echo: EF 70-75% 9/7>> CXR: Right midlung airspace disease worrisome for pneumonia.   Antimicrobial therapy: Vancomycin: 9/7>> 9/9 Cefepime: 9/7>> 9/9 Unasyn: 9/9>>   Microbiology data: 9/7>> COVID/influenza PCR: Negative 9/7>> blood culture: No growth   Procedures : EGD done 03/03/21 - mild gastritis. RUL Posterior subsegment BAL/Brushing/Tbbx  cytology non malignant >> 03/05/21   Consults: Cardiology, PCCM, GI                                                                 Hospital Course   Acute on chronic hypoxic respiratory failure: Secondary to HFpEF exacerbation and postobstructive pneumonia.  Initially on BiPAP-liberated of the BiPAP at 9/8.  He is back on his usual regimen of 3-4 L of oxygen.  Note patient was supposed to be on Lasix 40 twice daily but was taking it  once a day only, at this point particularly Lasix along with his home cardiac medications, currently euvolemic and completely symptom-free, will be discharged home with close outpatient PCP and cardiology follow-up.   Persistent right middle lobe infiltrate likely postobstructive pneumonia: Suspicion for a endobronchial lesion -he underwent bronchoscopy with biopsy on 03/05/2021, non malignant - reactive cytology, post discharge follow-up with Dr. [name] in 1 to 2 weeks, stopped vancomycin/cefepime-switched to Unasyn.  Serum Quanteferon levels negative.   HFpEF with exacerbation: Cardiology following appears slightly over diuresed on 03/07/2021, diuretics and blood pressure medications held, will monitor closely,  Need to be cautious with preload reduction given history of HOCM.     ?  Upper GI bleed: Apparently has a black stools for the past several weeks-no BM since admission.  Since his respiratory status is stabilized-have consulted GI.  Continue  PPI. EGD done 03/03/21 - mild gastritis.  Will be discharged on PPI.   Constipation.  Better after aggressive bowel regimen, now has had multiple bowel movements.  Placed on bowel regimen for discharge as well.   Elevated troponins: Probably related demand ischemia in the setting of anemia/respiratory failure.  No acute issues seen by cardiology.  Continue home regimen at this time upon discharge.   HOCM: Watch closely-minimize preload reduction is much as possible.  Follow with cardiology outpatient post discharge.   Anemia: Multifactorial-has anemia related to CKD at baseline-question of GI bleeding and acute blood loss as well.  Hemoglobin stable after 1 unit of PRBC on admission-H&H now stable PCP to continue to monitor.   HTN: BP stable on much less than what he takes at home for his blood pressure, question his dietary compliance at home, request PCP to monitor patient's blood pressure and blood pressure medications closely post discharge.   AKI on CKD stage IV: Baseline creatinine around 2.9, likely also with diabetes, hydrated blood pressure medications were adjusted, now back to baseline.   History of renal artery stenosis.  Supportive care monitor blood pressure.   OSA: On CPAP nightly   DM-2 (A1c 5.8 on 7/22): Continue home regimen requested to check CBGs q. ACH S maintain a log and show it to PCP in a week.   Discharge diagnosis     Principal Problem:   Acute and chronic respiratory failure with hypoxia (HCC) Active Problems:   Stage 4 chronic kidney disease (HCC)   Elevated troponin   Acute on chronic diastolic CHF (congestive heart failure) (HCC)   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HCC)   OSA (obstructive sleep apnea)   Type 2 diabetes mellitus with retinopathy, with long-term current use of insulin (HCC)   Multifocal pneumonia   Melena   Symptomatic anemia    Discharge instructions    Discharge Instructions     Discharge instructions   Complete  by: As directed    Follow with Primary MD [name], MD in 7 days   Get CBC, CMP, 2 view Chest X ray -  checked next visit within 1 week by Primary MD  Activity: As tolerated with Full fall precautions use walker/cane & assistance as needed  Disposition Home    Diet: Heart Healthy - Low Carb, Check your Weight same time everyday, if you gain over 2 pounds, or you develop in leg swelling, experience more shortness of breath or chest pain, call your Primary MD immediately. Follow Cardiac Low Salt Diet and 1.5 lit/day fluid restriction.  Accuchecks 4 times/day, Once in AM empty stomach and then before each meal.  Log in all results and show them to your Prim.MD in 3 days. If any glucose reading is under 80 or above 300 call your Prim MD immidiately. Follow Low glucose instructions for glucose under 80 as instructed.   Special Instructions: If you have smoked or chewed Tobacco  in the last 2 yrs please stop smoking, stop any regular Alcohol  and or any Recreational drug use.  On your next visit with your primary care physician please Get Medicines reviewed and adjusted.  Please request your Prim.MD to go over all Hospital Tests and Procedure/Radiological results at the follow up, please get all Hospital records sent to your Prim MD by signing hospital release before you go home.  If you experience worsening of your admission symptoms, develop shortness of breath, life threatening emergency, suicidal or homicidal thoughts you must seek medical attention immediately by calling 911 or calling your MD immediately  if symptoms less severe.  You Must read complete instructions/literature along with all the possible adverse reactions/side effects for all the Medicines you take and that have been prescribed to you. Take any new Medicines after you have completely understood and accpet all the possible adverse reactions/side effects.   Increase activity slowly   Complete by: As directed         Discharge Medications   Allergies as of 03/10/2021       Reactions   Aspirin Itching   Patient still takes a baby ASA everyday with no issues         Medication List     STOP taking these medications    cloNIDine 0.1 MG tablet Commonly known as: CATAPRES       TAKE these medications    amLODipine 10 MG tablet Commonly known as: NORVASC Take 1 tablet (10 mg total) by mouth daily.   aspirin EC 81 MG tablet Take 1 tablet (81 mg total) by mouth daily. Swallow whole.   atorvastatin 20 MG tablet Commonly known as: LIPITOR Take 1 tablet (20 mg total) by mouth daily.   brimonidine 0.2 % ophthalmic solution Commonly known as: ALPHAGAN Place 1 drop into the right eye 2 (two) times daily.   carvedilol 12.5 MG tablet Commonly known as: COREG Take 1 tablet (12.5 mg total) by mouth 2 (two) times daily. What changed:  medication strength how much to take   cholecalciferol 25 MCG (1000 UNIT) tablet Commonly known as: VITAMIN D3 Take 1,000 Units by mouth daily.   Dexcom G6 Sensor Misc 1 Device by Does not apply route as directed.   Dexcom G6 Transmitter Misc 1 Device by Does not apply route as directed.   diclofenac Sodium 1 % Gel Commonly known as: VOLTAREN Apply 4 g topically 4 (four) times daily as needed for pain.   docusate sodium 100 MG capsule Commonly known as: COLACE Take 2 capsules (200 mg total) by mouth 2 (two) times daily.   dorzolamide 2 % ophthalmic solution Commonly known as: TRUSOPT Place 1 drop into both eyes 2 (two) times daily.   doxazosin 2 MG tablet Commonly known as: CARDURA Take 2 mg by mouth daily. Patient not sure if still taking   furosemide 40 MG tablet Commonly known as: LASIX Take 1 tablet (40 mg total) by mouth 2 (two) times daily. What changed: when to take this   hydrALAZINE 100 MG tablet Commonly known as: APRESOLINE Take 1 tablet (100 mg total) by mouth 3 (three) times daily.   Insulin Glargine 300 UNIT/ML  Sopn Inject 20  Units into the skin at bedtime.   isosorbide mononitrate 60 MG 24 hr tablet Commonly known as: IMDUR Take 60 mg by mouth daily.   latanoprost 0.005 % ophthalmic solution Commonly known as: XALATAN Place 1 drop into the left eye at bedtime.   nitroGLYCERIN 0.4 MG SL tablet Commonly known as: NITROSTAT DISSOLVE ONE TABLET UNDER THE TONGUE EVERY 5 MINUTES AS NEEDED FOR CHEST PAIN. What changed: See the new instructions.   NovoLOG ReliOn 100 UNIT/ML injection Generic drug: insulin aspart Inject 6 Units into the skin with breakfast, with lunch, and with evening meal.   OneTouch Verio test strip Generic drug: glucose blood USE 1 STRIP TO CHECK GLUCOSE THREE TIMES DAILY AS DIRECTED   pantoprazole 40 MG tablet Commonly known as: Protonix Take 1 tablet (40 mg total) by mouth 2 (two) times daily before a meal.   polyethylene glycol 17 g packet Commonly known as: MiraLax Take 17 g by mouth daily.               Durable Medical Equipment  (From admission, onward)           Start     Ordered   03/09/21 0918  For home use only DME 4 wheeled rolling walker with seat  Once       Question:  Patient needs a walker to treat with the following condition  Answer:  Weakness   03/09/21 0917   03/09/21 0858  For home use only DME Walker rolling  Once       Comments: 5 wheel  Question Answer Comment  Walker: With 5 Inch Wheels   Patient needs a walker to treat with the following condition Weakness      03/09/21 0857             Follow-up Information     [name], MD. Go on 03/23/2021.   Specialty: Internal Medicine Why: 12:30 , apt already made Contact information: [place] [address] [phone]         [facility] Follow up on 03/16/2021.   Specialty: Cardiology Why: at [facility] for your cardiology follow up appointment with Ms Gilford Rile at above location Contact information: [address] [place] [address] [phone]        Freddi Starr, MD. Schedule an appointment as soon as possible for a visit in 1 week(s).   Specialty: Pulmonary Disease Contact information: [address] [place] [address] [phone]         Gean Quint, MD. Schedule an appointment as soon as possible for a visit in 1 week(s).   Specialty: Nephrology Contact information: [place] [address] [phone]         Jerline Pain, MD. Schedule an appointment as soon as possible for a visit in 1 week(s).   Specialty: Cardiology Contact information: [address] [place] [address] [place] [address] [phone]                 Major procedures and Radiology Reports - PLEASE review detailed and final reports thoroughly  -       DG Chest 2 View  Result Date: 02/28/2021 CLINICAL DATA:  Suspected sepsis. EXAM: CHEST - 2 VIEW COMPARISON:  Chest x-ray 02/12/2021. CT chest 01/30/2021. FINDINGS: Heart is enlarged, unchanged. The aorta is tortuous and contains atherosclerotic calcifications. Right mid lung airspace disease is again noted similar to the prior examination. There are stable  small bilateral pleural effusions. There are increased central interstitial markings bilaterally. There is no evidence for pneumothorax. No acute fractures are seen. Degenerative changes affect the spine. IMPRESSION: 1. Stable right mid lung airspace disease worrisome for pneumonia. 2. Stable small bilateral pleural effusions. 3. Cardiomegaly with central pulmonary vascular congestion. Electronically Signed   By: Ronney Asters M.D.   On: 02/28/2021 21:16   CT CHEST WO CONTRAST  Result Date: 03/07/2021 CLINICAL DATA:  Pneumonia follow-up. EXAM: CT CHEST WITHOUT CONTRAST TECHNIQUE: Multidetector CT imaging of the chest was performed following the standard protocol without IV contrast. COMPARISON:  CT chest dated January 30, 2021. FINDINGS:  Cardiovascular: Stable cardiomegaly. No pericardial effusion. No thoracic aortic aneurysm. Coronary, aortic arch, and branch vessel atherosclerotic vascular disease. Mediastinum/Nodes: No enlarged mediastinal or axillary lymph nodes. Unchanged 1.6 cm hypodense nodule in the left thyroid lobe. The trachea and esophagus demonstrate no significant findings. Lungs/Pleura: Unchanged small right greater than left pleural effusions. Resolved interlobular septal thickening and layering ground-glass densities in both lungs. Significantly improved consolidation in the right upper lobe with some persistent airspace disease posteriorly. Mildly worsened atelectasis in the right greater than left lower lobes. Unchanged no pneumothorax. Upper Abdomen: No acute abnormality. Unchanged symmetric bilateral adrenal gland thickening. Musculoskeletal: No acute or significant osseous findings. IMPRESSION: 1. Significantly improved consolidation in the right upper lobe with some persistent airspace disease posteriorly, consistent with resolving pneumonia. Continued follow-up to resolution is recommended. 2. Resolved pulmonary edema. Unchanged small right greater than left pleural effusions. 3. Mildly worsened atelectasis in the right greater than left lower lobes. 4. Unchanged 1.6 cm hypodense nodule in the left thyroid lobe. Recommend thyroid US (ref: J Am Coll Radiol. 2015 Feb;12(2): 143-50). 5. Aortic Atherosclerosis (ICD10-I70.0). Electronically Signed   By: Titus Dubin M.D.   On: 03/07/2021 15:01   DG CHEST PORT 1 VIEW  Result Date: 03/05/2021 CLINICAL DATA:  Post bronchoscopy EXAM: PORTABLE CHEST 1 VIEW COMPARISON:  02/28/2021 FINDINGS: The lungs are symmetrically well expanded. Airspace infiltrate within the right mid lung zone appears improved since prior examination, particularly when compared to remote prior examination of 01/30/2021. No pneumothorax or pleural effusion. Mild cardiomegaly is stable. Central pulmonary  arteries are enlarged in keeping with changes of pulmonary arterial hypertension. No acute bone abnormality. IMPRESSION: No pneumothorax following bronchoscopy. Improving right mid lung zone pulmonary infiltrate. Stable cardiomegaly and enlargement of the central pulmonary arteries. Electronically Signed   By: Fidela Salisbury M.D.   On: 03/05/2021 16:34   DG Abd Portable 1V  Result Date: 03/07/2021 CLINICAL DATA:  Constipation, abdominal discomfort EXAM: PORTABLE ABDOMEN - 1 VIEW COMPARISON:  None. FINDINGS: There is a nonobstructive bowel gas pattern. There is a large stool burden throughout the colon. There is no gross organomegaly or abnormal soft tissue calcification. The heart is enlarged. The lung bases are clear. There is degenerative change in the lower lumbar spine. IMPRESSION: Large colonic stool burden without evidence of mechanical obstruction. Electronically Signed   By: Valetta Mole M.D.   On: 03/07/2021 08:33   DG C-ARM BRONCHOSCOPY  Result Date: 03/05/2021 C-ARM BRONCHOSCOPY: Fluoroscopy was utilized by the requesting physician.  No radiographic interpretation.     Today   Subjective    Robert Chaney today has no headache,no chest abdominal pain,no new weakness tingling or numbness, feels much better wants to go home today.     Objective   Blood pressure (!) 146/70, pulse 63, temperature 98.6 F (37 C), temperature source Oral, resp. rate 16,  height _0  (1.727 m), weight 73.5 kg, SpO2 99 %.   Intake/Output Summary (Last 24 hours) at 03/10/2021 0951 Last data filed at 03/10/2021 0900 Gross per 24 hour  Intake 1080 ml  Output 1300 ml  Net -220 ml    Exam  Awake Alert, No new F.N deficits, Normal affect [place].AT,PERRAL Supple Neck,No JVD, No cervical lymphadenopathy appriciated.  Symmetrical Chest wall movement, Good air movement bilaterally, CTAB RRR,No Gallops,Rubs or new Murmurs, No Parasternal Heave +ve B.Sounds, Abd Soft, Non tender, No organomegaly appriciated, No  rebound -guarding or rigidity. No Cyanosis, Clubbing or edema, No new Rash or bruise   Data Review   CBC w Diff:  Lab Results  Component Value Date   WBC 6.1 03/09/2021   HGB 8.6 (L) 03/09/2021   HGB 12.2 (L) 11/07/2016   HCT 28.1 (L) 03/09/2021   HCT 35.9 (L) 11/07/2016   PLT 192 03/09/2021   PLT 233 11/07/2016   LYMPHOPCT 18 03/09/2021   MONOPCT 9 03/09/2021   EOSPCT 1 03/09/2021   BASOPCT 0 03/09/2021    CMP:  Lab Results  Component Value Date   NA 136 03/09/2021   NA 138 05/09/2020   K 4.2 03/09/2021   CL 101 03/09/2021   CO2 29 03/09/2021   BUN 51 (H) 03/09/2021   BUN 41 (H) 05/09/2020   CREATININE 2.53 (H) 03/09/2021   PROT 6.7 03/09/2021   PROT 7.0 11/07/2016   ALBUMIN 2.7 (L) 03/09/2021   ALBUMIN 3.7 11/07/2016   BILITOT 0.8 03/09/2021   BILITOT 0.6 11/07/2016   ALKPHOS 57 03/09/2021   AST 18 03/09/2021   ALT 29 03/09/2021  .   Total Time in preparing paper work, data evaluation and todays exam - 44 minutes  Lala Lund M.D on 03/10/2021 at 9:51 AM  [facility]

## 2021-03-11 LAB — BPAM RBC
Blood Product Expiration Date: 202210132359
Unit Type and Rh: 5100

## 2021-03-11 LAB — TYPE AND SCREEN
ABO/RH(D): O POS
Antibody Screen: NEGATIVE
Unit division: 0

## 2021-03-14 DIAGNOSIS — I509 Heart failure, unspecified: Secondary | ICD-10-CM | POA: Diagnosis not present

## 2021-03-14 DIAGNOSIS — E7849 Other hyperlipidemia: Secondary | ICD-10-CM | POA: Diagnosis not present

## 2021-03-14 DIAGNOSIS — E1122 Type 2 diabetes mellitus with diabetic chronic kidney disease: Secondary | ICD-10-CM | POA: Diagnosis not present

## 2021-03-14 DIAGNOSIS — I10 Essential (primary) hypertension: Secondary | ICD-10-CM | POA: Diagnosis not present

## 2021-03-14 DIAGNOSIS — J9611 Chronic respiratory failure with hypoxia: Secondary | ICD-10-CM | POA: Diagnosis not present

## 2021-03-14 DIAGNOSIS — R0902 Hypoxemia: Secondary | ICD-10-CM | POA: Diagnosis not present

## 2021-03-14 DIAGNOSIS — Z0000 Encounter for general adult medical examination without abnormal findings: Secondary | ICD-10-CM | POA: Diagnosis not present

## 2021-03-16 ENCOUNTER — Inpatient Hospital Stay (HOSPITAL_COMMUNITY)
Admission: EM | Admit: 2021-03-16 | Discharge: 2021-03-22 | DRG: 291 | Disposition: A | Discharge status: Home Health | Payer: PPO | Attending: Student | Admitting: Student

## 2021-03-16 ENCOUNTER — Emergency Department (HOSPITAL_COMMUNITY): Payer: PPO

## 2021-03-16 ENCOUNTER — Ambulatory Visit (HOSPITAL_BASED_OUTPATIENT_CLINIC_OR_DEPARTMENT_OTHER): Payer: PPO | Admitting: Family

## 2021-03-16 DIAGNOSIS — Z8249 Family history of ischemic heart disease and other diseases of the circulatory system: Secondary | ICD-10-CM | POA: Diagnosis not present

## 2021-03-16 DIAGNOSIS — K922 Gastrointestinal hemorrhage, unspecified: Secondary | ICD-10-CM | POA: Diagnosis present

## 2021-03-16 DIAGNOSIS — I130 Hypertensive heart and chronic kidney disease with heart failure and stage 1 through stage 4 chronic kidney disease, or unspecified chronic kidney disease: Principal | ICD-10-CM | POA: Diagnosis present

## 2021-03-16 DIAGNOSIS — G931 Anoxic brain damage, not elsewhere classified: Secondary | ICD-10-CM | POA: Diagnosis not present

## 2021-03-16 DIAGNOSIS — J9622 Acute and chronic respiratory failure with hypercapnia: Secondary | ICD-10-CM | POA: Diagnosis not present

## 2021-03-16 DIAGNOSIS — G928 Other toxic encephalopathy: Secondary | ICD-10-CM | POA: Diagnosis not present

## 2021-03-16 DIAGNOSIS — I517 Cardiomegaly: Secondary | ICD-10-CM | POA: Diagnosis not present

## 2021-03-16 DIAGNOSIS — E8770 Fluid overload, unspecified: Secondary | ICD-10-CM | POA: Diagnosis not present

## 2021-03-16 DIAGNOSIS — Z8701 Personal history of pneumonia (recurrent): Secondary | ICD-10-CM

## 2021-03-16 DIAGNOSIS — Z20822 Contact with and (suspected) exposure to covid-19: Secondary | ICD-10-CM | POA: Diagnosis not present

## 2021-03-16 DIAGNOSIS — R0789 Other chest pain: Secondary | ICD-10-CM | POA: Diagnosis not present

## 2021-03-16 DIAGNOSIS — R0902 Hypoxemia: Secondary | ICD-10-CM | POA: Diagnosis not present

## 2021-03-16 DIAGNOSIS — I5032 Chronic diastolic (congestive) heart failure: Secondary | ICD-10-CM | POA: Diagnosis not present

## 2021-03-16 DIAGNOSIS — E041 Nontoxic single thyroid nodule: Secondary | ICD-10-CM | POA: Diagnosis present

## 2021-03-16 DIAGNOSIS — K2970 Gastritis, unspecified, without bleeding: Secondary | ICD-10-CM

## 2021-03-16 DIAGNOSIS — I5033 Acute on chronic diastolic (congestive) heart failure: Secondary | ICD-10-CM

## 2021-03-16 DIAGNOSIS — B9681 Helicobacter pylori [H. pylori] as the cause of diseases classified elsewhere: Secondary | ICD-10-CM | POA: Diagnosis not present

## 2021-03-16 DIAGNOSIS — J811 Chronic pulmonary edema: Secondary | ICD-10-CM | POA: Diagnosis not present

## 2021-03-16 DIAGNOSIS — E1165 Type 2 diabetes mellitus with hyperglycemia: Secondary | ICD-10-CM | POA: Diagnosis present

## 2021-03-16 DIAGNOSIS — E1151 Type 2 diabetes mellitus with diabetic peripheral angiopathy without gangrene: Secondary | ICD-10-CM | POA: Diagnosis present

## 2021-03-16 DIAGNOSIS — G4733 Obstructive sleep apnea (adult) (pediatric): Secondary | ICD-10-CM | POA: Diagnosis present

## 2021-03-16 DIAGNOSIS — Z833 Family history of diabetes mellitus: Secondary | ICD-10-CM | POA: Diagnosis not present

## 2021-03-16 DIAGNOSIS — R7989 Other specified abnormal findings of blood chemistry: Secondary | ICD-10-CM | POA: Diagnosis not present

## 2021-03-16 DIAGNOSIS — N179 Acute kidney failure, unspecified: Secondary | ICD-10-CM | POA: Diagnosis present

## 2021-03-16 DIAGNOSIS — R9389 Abnormal findings on diagnostic imaging of other specified body structures: Secondary | ICD-10-CM | POA: Diagnosis present

## 2021-03-16 DIAGNOSIS — Z794 Long term (current) use of insulin: Secondary | ICD-10-CM

## 2021-03-16 DIAGNOSIS — I161 Hypertensive emergency: Secondary | ICD-10-CM | POA: Diagnosis present

## 2021-03-16 DIAGNOSIS — J9621 Acute and chronic respiratory failure with hypoxia: Secondary | ICD-10-CM

## 2021-03-16 DIAGNOSIS — R0600 Dyspnea, unspecified: Secondary | ICD-10-CM | POA: Diagnosis not present

## 2021-03-16 DIAGNOSIS — K921 Melena: Secondary | ICD-10-CM | POA: Diagnosis not present

## 2021-03-16 DIAGNOSIS — N184 Chronic kidney disease, stage 4 (severe): Secondary | ICD-10-CM | POA: Diagnosis present

## 2021-03-16 DIAGNOSIS — R778 Other specified abnormalities of plasma proteins: Secondary | ICD-10-CM | POA: Diagnosis not present

## 2021-03-16 DIAGNOSIS — E875 Hyperkalemia: Secondary | ICD-10-CM | POA: Diagnosis not present

## 2021-03-16 DIAGNOSIS — E1122 Type 2 diabetes mellitus with diabetic chronic kidney disease: Secondary | ICD-10-CM

## 2021-03-16 DIAGNOSIS — N189 Chronic kidney disease, unspecified: Secondary | ICD-10-CM

## 2021-03-16 DIAGNOSIS — K601 Chronic anal fissure: Secondary | ICD-10-CM

## 2021-03-16 DIAGNOSIS — K2961 Other gastritis with bleeding: Secondary | ICD-10-CM | POA: Diagnosis not present

## 2021-03-16 DIAGNOSIS — I422 Other hypertrophic cardiomyopathy: Secondary | ICD-10-CM | POA: Diagnosis present

## 2021-03-16 DIAGNOSIS — J9601 Acute respiratory failure with hypoxia: Secondary | ICD-10-CM

## 2021-03-16 DIAGNOSIS — R4020 Unspecified coma: Secondary | ICD-10-CM | POA: Diagnosis not present

## 2021-03-16 DIAGNOSIS — H409 Unspecified glaucoma: Secondary | ICD-10-CM | POA: Diagnosis present

## 2021-03-16 DIAGNOSIS — J189 Pneumonia, unspecified organism: Secondary | ICD-10-CM

## 2021-03-16 DIAGNOSIS — K602 Anal fissure, unspecified: Secondary | ICD-10-CM | POA: Diagnosis present

## 2021-03-16 DIAGNOSIS — R0602 Shortness of breath: Secondary | ICD-10-CM | POA: Diagnosis present

## 2021-03-16 DIAGNOSIS — I5030 Unspecified diastolic (congestive) heart failure: Secondary | ICD-10-CM | POA: Diagnosis not present

## 2021-03-16 DIAGNOSIS — D649 Anemia, unspecified: Secondary | ICD-10-CM

## 2021-03-16 DIAGNOSIS — J90 Pleural effusion, not elsewhere classified: Secondary | ICD-10-CM | POA: Diagnosis not present

## 2021-03-16 DIAGNOSIS — K284 Chronic or unspecified gastrojejunal ulcer with hemorrhage: Secondary | ICD-10-CM | POA: Diagnosis not present

## 2021-03-16 DIAGNOSIS — Z9989 Dependence on other enabling machines and devices: Secondary | ICD-10-CM | POA: Diagnosis not present

## 2021-03-16 DIAGNOSIS — I110 Hypertensive heart disease with heart failure: Secondary | ICD-10-CM

## 2021-03-16 DIAGNOSIS — I119 Hypertensive heart disease without heart failure: Secondary | ICD-10-CM | POA: Diagnosis present

## 2021-03-16 DIAGNOSIS — N183 Chronic kidney disease, stage 3 unspecified: Secondary | ICD-10-CM | POA: Diagnosis not present

## 2021-03-16 DIAGNOSIS — D62 Acute posthemorrhagic anemia: Secondary | ICD-10-CM | POA: Diagnosis not present

## 2021-03-16 DIAGNOSIS — E7800 Pure hypercholesterolemia, unspecified: Secondary | ICD-10-CM | POA: Diagnosis present

## 2021-03-16 DIAGNOSIS — K5909 Other constipation: Secondary | ICD-10-CM | POA: Diagnosis not present

## 2021-03-16 DIAGNOSIS — D631 Anemia in chronic kidney disease: Secondary | ICD-10-CM | POA: Diagnosis present

## 2021-03-16 DIAGNOSIS — I421 Obstructive hypertrophic cardiomyopathy: Secondary | ICD-10-CM | POA: Diagnosis not present

## 2021-03-16 DIAGNOSIS — J9690 Respiratory failure, unspecified, unspecified whether with hypoxia or hypercapnia: Secondary | ICD-10-CM | POA: Diagnosis not present

## 2021-03-16 DIAGNOSIS — R079 Chest pain, unspecified: Secondary | ICD-10-CM | POA: Diagnosis not present

## 2021-03-16 DIAGNOSIS — R410 Disorientation, unspecified: Secondary | ICD-10-CM | POA: Diagnosis not present

## 2021-03-16 DIAGNOSIS — D638 Anemia in other chronic diseases classified elsewhere: Secondary | ICD-10-CM | POA: Diagnosis not present

## 2021-03-16 DIAGNOSIS — R609 Edema, unspecified: Secondary | ICD-10-CM | POA: Diagnosis not present

## 2021-03-16 DIAGNOSIS — I129 Hypertensive chronic kidney disease with stage 1 through stage 4 chronic kidney disease, or unspecified chronic kidney disease: Secondary | ICD-10-CM | POA: Diagnosis not present

## 2021-03-16 LAB — LACTIC ACID, PLASMA
Lactic Acid, Venous: 1.6 mmol/L (ref 0.5–1.9)
Lactic Acid, Venous: 0.9 mmol/L (ref 0.5–1.9)

## 2021-03-16 LAB — RESP PANEL BY RT-PCR (FLU A&B, COVID) ARPGX2
Influenza A by PCR: NEGATIVE
Influenza B by PCR: NEGATIVE
SARS Coronavirus 2 by RT PCR: NEGATIVE

## 2021-03-16 LAB — COMPREHENSIVE METABOLIC PANEL
ALT: 20 U/L (ref 0–44)
AST: 27 U/L (ref 15–41)
Albumin: 2.9 g/dL — ABNORMAL LOW (ref 3.5–5.0)
Alkaline Phosphatase: 75 U/L (ref 38–126)
Anion gap: 6 (ref 5–15)
BUN: 49 mg/dL — ABNORMAL HIGH (ref 8–23)
CO2: 25 mmol/L (ref 22–32)
Calcium: 8.6 mg/dL — ABNORMAL LOW (ref 8.9–10.3)
Chloride: 108 mmol/L (ref 98–111)
Creatinine, Ser: 2.64 mg/dL — ABNORMAL HIGH (ref 0.61–1.24)
GFR, Estimated: 25 mL/min — ABNORMAL LOW (ref >60)
Glucose, Bld: 181 mg/dL — ABNORMAL HIGH (ref 70–99)
Potassium: 5.7 mmol/L — ABNORMAL HIGH (ref 3.5–5.1)
Sodium: 139 mmol/L (ref 135–145)
Total Bilirubin: 0.8 mg/dL (ref 0.3–1.2)
Total Protein: 7 g/dL (ref 6.5–8.1)

## 2021-03-16 LAB — CBC WITH DIFFERENTIAL/PLATELET
Abs Immature Granulocytes: 0.11 10*3/uL — ABNORMAL HIGH (ref 0.00–0.07)
Basophils Absolute: 0.1 10*3/uL (ref 0.0–0.1)
Basophils Relative: 0 %
Eosinophils Absolute: 0.1 10*3/uL (ref 0.0–0.5)
Eosinophils Relative: 0 %
HCT: 26.9 % — ABNORMAL LOW (ref 39.0–52.0)
Hemoglobin: 7.6 g/dL — ABNORMAL LOW (ref 13.0–17.0)
Immature Granulocytes: 1 %
Lymphocytes Relative: 13 %
Lymphs Abs: 2.7 10*3/uL (ref 0.7–4.0)
MCH: 28.5 pg (ref 26.0–34.0)
MCHC: 28.3 g/dL — ABNORMAL LOW (ref 30.0–36.0)
MCV: 100.7 fL — ABNORMAL HIGH (ref 80.0–100.0)
Monocytes Absolute: 1.3 10*3/uL — ABNORMAL HIGH (ref 0.1–1.0)
Monocytes Relative: 6 %
Neutro Abs: 17 10*3/uL — ABNORMAL HIGH (ref 1.7–7.7)
Neutrophils Relative %: 80 %
Platelets: 312 10*3/uL (ref 150–400)
RBC: 2.67 MIL/uL — ABNORMAL LOW (ref 4.22–5.81)
RDW: 16.4 % — ABNORMAL HIGH (ref 11.5–15.5)
WBC: 21.2 10*3/uL — ABNORMAL HIGH (ref 4.0–10.5)
nRBC: 0 % (ref 0.0–0.2)

## 2021-03-16 LAB — CREATININE, SERUM
Creatinine, Ser: 2.7 mg/dL — ABNORMAL HIGH (ref 0.61–1.24)
GFR, Estimated: 24 mL/min — ABNORMAL LOW (ref >60)

## 2021-03-16 LAB — I-STAT ARTERIAL BLOOD GAS, ED
Acid-Base Excess: 3 mmol/L — ABNORMAL HIGH (ref 0.0–2.0)
Bicarbonate: 28.9 mmol/L — ABNORMAL HIGH (ref 20.0–28.0)
Calcium, Ion: 1.23 mmol/L (ref 1.15–1.40)
HCT: 21 % — ABNORMAL LOW (ref 39.0–52.0)
Hemoglobin: 7.1 g/dL — ABNORMAL LOW (ref 13.0–17.0)
O2 Saturation: 99 %
Patient temperature: 98.2
Potassium: 5.5 mmol/L — ABNORMAL HIGH (ref 3.5–5.1)
Sodium: 140 mmol/L (ref 135–145)
TCO2: 31 mmol/L (ref 22–32)
pCO2 arterial: 53 mmHg — ABNORMAL HIGH (ref 32.0–48.0)
pH, Arterial: 7.344 — ABNORMAL LOW (ref 7.350–7.450)
pO2, Arterial: 156 mmHg — ABNORMAL HIGH (ref 83.0–108.0)

## 2021-03-16 LAB — BRAIN NATRIURETIC PEPTIDE: B Natriuretic Peptide: 1475 pg/mL — ABNORMAL HIGH (ref 0.0–100.0)

## 2021-03-16 LAB — PREPARE RBC (CROSSMATCH)

## 2021-03-16 LAB — D-DIMER, QUANTITATIVE: D-Dimer, Quant: 5.8 ug/mL-FEU — ABNORMAL HIGH (ref 0.00–0.50)

## 2021-03-16 LAB — POC OCCULT BLOOD, ED: Fecal Occult Bld: POSITIVE — AB

## 2021-03-16 LAB — TROPONIN I (HIGH SENSITIVITY)
Troponin I (High Sensitivity): 112 ng/L (ref <18)
Troponin I (High Sensitivity): 98 ng/L — ABNORMAL HIGH (ref <18)

## 2021-03-16 MED ORDER — SODIUM CHLORIDE 0.9% IV SOLUTION: Freq: Once | INTRAVENOUS | Status: AC

## 2021-03-16 MED ORDER — PANTOPRAZOLE SODIUM 40 MG IV SOLR
40.0000 mg | Freq: Two times a day (BID) | INTRAVENOUS | Status: DC
  Administered 2021-03-17 – 2021-03-18 (×3): 40 mg via INTRAVENOUS
  Filled 2021-03-16 (×3): qty 40

## 2021-03-16 MED ORDER — SODIUM CHLORIDE 0.9 % IV SOLN
2.0000 g | INTRAVENOUS | Status: DC
  Administered 2021-03-16 – 2021-03-18 (×3): 2 g via INTRAVENOUS
  Filled 2021-03-16 (×3): qty 2

## 2021-03-16 MED ORDER — IPRATROPIUM-ALBUTEROL 0.5-2.5 (3) MG/3ML IN SOLN: 3.0000 mL | Freq: Four times a day (QID) | RESPIRATORY_TRACT | Status: DC

## 2021-03-16 MED ORDER — VANCOMYCIN HCL 1500 MG/300ML IV SOLN
1500.0000 mg | Freq: Once | INTRAVENOUS | Status: AC
  Administered 2021-03-16: 1500 mg via INTRAVENOUS
  Filled 2021-03-16: qty 300

## 2021-03-16 MED ORDER — INSULIN ASPART 100 UNIT/ML IJ SOLN
0.0000 [IU] | INTRAMUSCULAR | Status: DC
  Administered 2021-03-17: 1 [IU] via SUBCUTANEOUS
  Administered 2021-03-17: 2 [IU] via SUBCUTANEOUS
  Administered 2021-03-17 (×3): 1 [IU] via SUBCUTANEOUS

## 2021-03-16 MED ORDER — ALBUTEROL SULFATE (2.5 MG/3ML) 0.083% IN NEBU: 2.5000 mg | INHALATION_SOLUTION | RESPIRATORY_TRACT | Status: DC | PRN

## 2021-03-16 MED ORDER — POLYETHYLENE GLYCOL 3350 17 G PO PACK
17.0000 g | PACK | Freq: Every day | ORAL | Status: DC | PRN
  Administered 2021-03-17 – 2021-03-19 (×2): 17 g via ORAL
  Filled 2021-03-16 (×2): qty 1

## 2021-03-16 MED ORDER — SODIUM CHLORIDE 0.9 % IV SOLN
500.0000 mg | Freq: Once | INTRAVENOUS | Status: AC
  Administered 2021-03-16: 500 mg via INTRAVENOUS
  Filled 2021-03-16: qty 500

## 2021-03-16 MED ORDER — HEPARIN SODIUM (PORCINE) 5000 UNIT/ML IJ SOLN
5000.0000 [IU] | Freq: Three times a day (TID) | INTRAMUSCULAR | Status: DC
  Administered 2021-03-16: 5000 [IU] via SUBCUTANEOUS
  Filled 2021-03-16: qty 1

## 2021-03-16 MED ORDER — DOCUSATE SODIUM 100 MG PO CAPS
100.0000 mg | ORAL_CAPSULE | Freq: Two times a day (BID) | ORAL | Status: DC | PRN
  Administered 2021-03-19: 100 mg via ORAL
  Filled 2021-03-16 (×2): qty 1

## 2021-03-16 MED ORDER — FUROSEMIDE 10 MG/ML IJ SOLN
40.0000 mg | Freq: Once | INTRAMUSCULAR | Status: AC
  Administered 2021-03-17: 40 mg via INTRAVENOUS
  Filled 2021-03-16: qty 4

## 2021-03-16 MED ORDER — PANTOPRAZOLE SODIUM 40 MG IV SOLR
40.0000 mg | Freq: Every day | INTRAVENOUS | Status: DC
  Administered 2021-03-16: 40 mg via INTRAVENOUS
  Filled 2021-03-16: qty 40

## 2021-03-16 MED ORDER — FUROSEMIDE 10 MG/ML IJ SOLN
80.0000 mg | Freq: Once | INTRAMUSCULAR | Status: AC
  Administered 2021-03-16: 80 mg via INTRAVENOUS
  Filled 2021-03-16: qty 8

## 2021-03-16 NOTE — ED Triage Notes (Signed)
Pt bib EMS from home for respiratory distress. EMS attempted to use 15L non-rebreather mask, oxygen at 73%, and then placed on CPAP without responsiveness. Pt became unresponsive and was placed on Bag valve mask. 125mg  solu-medrol given and duoneb given by EMS. Hx CHF.  Also was c/o chest pain with EMS.   Ems vitals: HR and pulse 100 CBG 211 BP 260/110

## 2021-03-16 NOTE — H&P (Addendum)
NAME:  Robert Chaney MRN:  [id number] DOB:  [birth year] LOS: 0 ADMISSION DATE:  03/16/2021 DATE OF SERVICE:  03/16/2021  CHIEF COMPLAINT:  dyspnea, altered mental status   HISTORY & PHYSICAL  History of Present Illness  This 70 y.o. African American male nonsmoker presented to the [facility] Emergency Department via EMS with complaints of respiratory distress.  Per EMS, the patient was found to be hypoxic.  SpO2 73% despite non-rebreather mask. He was reportedly placed on CPAP and subsequently became unresponsive.  He was supported with bag-mask ventilation and transported to the ER.  He was given DuoNeb and SoluMedrol 125 mg IV single dose.  Of note, he was hypertensive (260/110) and tachycardic (100).  In the ER, the patient has been placed on BiPAP.  He was also noted to be incontinent of bloody stool.  PCCM asked to admit.  Of note, the patient was recently hospitalized for pneumonia and was discharged on 9/17 and was treated with cefepime/vancomycin which was switched to Unasyn.  He is also noted to have HFpER/hypertensive heart disease with diastolic dysfunction.  At the time of clinical interview, the patient is on BiPAP, FiO2 50%.  SpO2 100%.  He is arousable to loud voice and noxious stimuli but does not stay awake for clinical interview.  REVIEW OF SYSTEMS This patient is critically ill and cannot provide additional history nor review of systems due to mental status/unconsciousness.   Past Medical/Surgical/Social/Family History   Past Medical History:  Diagnosis Date   Altered mental status    a. 05/2017 - adm with blurred vision, somnolence in setting of AKI and high blood sugar.   Anemia    CKD (chronic kidney disease), stage III (HCC)    Diabetes mellitus    Diastolic CHF, chronic ([place])    A.  03/2009 Echo: EF 60-65%, Gr II diast dysfxn   Elevated troponin    a. 2013 - troponin 1.4. b. 2019 - troponin 0.32; neg nuc 07/2017.   Glaucoma    Hyperlipidemia     HYPERCHOLESTEROLEMIA   Hypertension    MARKED LEFT VENTRICULAR HYPERTROPHY BY PREVIOUS ECHOCARDIOGRAM--HE HAS HYPERDYNAMIC LEFT VENTRICULAR SYSTOLIC FUNCTION AND HAS IMPAIRED RELAXATION BY ECHO   Hypertrophic cardiomyopathy (HCC)    NSVT (nonsustained ventricular tachycardia) (HCC)    Premature atrial contractions    PVC's (premature ventricular contractions)    SVT (supraventricular tachycardia) ([place])     Past Surgical History:  Procedure Laterality Date   BIOPSY  03/03/2021   Procedure: BIOPSY;  Surgeon: Jackquline Denmark, MD;  Location: [place];  Service: Endoscopy;;   BRONCHIAL BIOPSY  03/05/2021   Procedure: BRONCHIAL BIOPSIES;  Surgeon: Candee Furbish, MD;  Location: [facility] ENDOSCOPY;  Service: Pulmonary;;   BRONCHIAL BRUSHINGS  03/05/2021   Procedure: BRONCHIAL BRUSHINGS;  Surgeon: Candee Furbish, MD;  Location: [facility] ENDOSCOPY;  Service: Pulmonary;;   BRONCHIAL WASHINGS  03/05/2021   Procedure: BRONCHIAL WASHINGS;  Surgeon: Candee Furbish, MD;  Location: [facility] ENDOSCOPY;  Service: Pulmonary;;   ESOPHAGOGASTRODUODENOSCOPY (EGD) WITH PROPOFOL N/A 03/03/2021   Procedure: ESOPHAGOGASTRODUODENOSCOPY (EGD) WITH PROPOFOL;  Surgeon: Jackquline Denmark, MD;  Location: [facility] ENDOSCOPY;  Service: Endoscopy;  Laterality: N/A;   HEMOSTASIS CONTROL  03/05/2021   Procedure: HEMOSTASIS CONTROL;  Surgeon: Candee Furbish, MD;  Location: [facility] ENDOSCOPY;  Service: Pulmonary;;   NO PAST SURGERIES     VIDEO BRONCHOSCOPY N/A 03/05/2021   Procedure: VIDEO BRONCHOSCOPY WITH FLUORO;  Surgeon: Candee Furbish, MD;  Location: [facility]  ENDOSCOPY;  Service: Pulmonary;  Laterality: N/A;    Social History   Tobacco Use   Smoking status: Never   Smokeless tobacco: Never  Substance Use Topics   Alcohol use: No    Family History  Problem Relation Age of Onset   Hypertension Father    Stroke Father    Hypertension Mother    Diabetes Brother    Hypertension Brother    Diabetes Brother      Procedures:     Significant  Diagnostic Tests:     Micro Data:   Results for orders placed or performed during the hospital encounter of 03/16/21  Resp Panel by RT-PCR (Flu A&B, Covid) Nasopharyngeal Swab     Status: None   Collection Time: 03/16/21  7:54 PM   Specimen: Nasopharyngeal Swab; Nasopharyngeal(NP) swabs in vial transport medium  Result Value Ref Range Status   SARS Coronavirus 2 by RT PCR NEGATIVE NEGATIVE Final    Comment: (NOTE) SARS-CoV-2 target nucleic acids are NOT DETECTED.  The SARS-CoV-2 RNA is generally detectable in upper respiratory specimens during the acute phase of infection. The lowest concentration of SARS-CoV-2 viral copies this assay can detect is 138 copies/mL. A negative result does not preclude SARS-Cov-2 infection and should not be used as the sole basis for treatment or other patient management decisions. A negative result may occur with  improper specimen collection/handling, submission of specimen other than nasopharyngeal swab, presence of viral mutation(s) within the areas targeted by this assay, and inadequate number of viral copies(<138 copies/mL). A negative result must be combined with clinical observations, patient history, and epidemiological information. The expected result is Negative.  Fact Sheet for Patients:  EntrepreneurPulse.com.au  Fact Sheet for Healthcare Providers:  IncredibleEmployment.be  This test is no t yet approved or cleared by the [place] FDA and  has been authorized for detection and/or diagnosis of SARS-CoV-2 by FDA under an Emergency Use Authorization (EUA). This EUA will remain  in effect (meaning this test can be used) for the duration of the COVID-19 declaration under Section 564(b)(1) of the Act, 21 U.S.C.section 360bbb-3(b)(1), unless the authorization is terminated  or revoked sooner.       Influenza A by PCR NEGATIVE NEGATIVE Final   Influenza B by PCR NEGATIVE NEGATIVE Final     Comment: (NOTE) The Xpert Xpress SARS-CoV-2/FLU/RSV plus assay is intended as an aid in the diagnosis of influenza from Nasopharyngeal swab specimens and should not be used as a sole basis for treatment. Nasal washings and aspirates are unacceptable for Xpert Xpress SARS-CoV-2/FLU/RSV testing.  Fact Sheet for Patients: EntrepreneurPulse.com.au  Fact Sheet for Healthcare Providers: IncredibleEmployment.be  This test is not yet approved or cleared by the [place] FDA and has been authorized for detection and/or diagnosis of SARS-CoV-2 by FDA under an Emergency Use Authorization (EUA). This EUA will remain in effect (meaning this test can be used) for the duration of the COVID-19 declaration under Section 564(b)(1) of the Act, 21 U.S.C. section 360bbb-3(b)(1), unless the authorization is terminated or revoked.  Performed at [facility], [place] [address], [place], [place] [address]       Antimicrobials:  Cefepime/azithromycin/vancomcyin (9/23>>)    Interim history/subjective:     Objective   BP 115/60   Pulse (!) 58   Resp 18   SpO2 100%     There were no vitals filed for this visit.  Intake/Output Summary (Last 24 hours) at 03/16/2021 2223 Last data filed at 03/16/2021 2213 Gross per  24 hour  Intake 250 ml  Output --  Net 250 ml    FiO2 (%):  [100 %] 100 %   Examination: GENERAL:  oriented to time, person and place, lethargic/drowsy, arousable to loud voice and noxious stimuli, well-developed. No acute distress. HEAD: normocephalic, atraumatic EYE: PERRLA, EOM intact, no scleral icterus, no pallor. THROAT/ORAL CAVITY: Normal dentition. No oral thrush. No exudate. Mucous membranes are moist. No tonsillar enlargement. Marland Kitchen NECK: supple, no thyromegaly, no JVD, no lymphadenopathy. Trachea midline. CHEST/LUNG: symmetric in development and expansion. Good air entry. Scattered crackles bilaterally.  Rales and rhochi in the  right mid an dlower lung zones. HEART: Regular S1 and S2 without murmur, rub or gallop. ABDOMEN: soft, nontender, nondistended. Normoactive bowel sounds. No rebound. No guarding. No hepatosplenomegaly. EXTREMITIES: Edema: 1+. No cyanosis. No clubbing. 2+ DP pulses LYMPHATIC: no cervical/axillary/inguinal lymph nodes appreciated MUSCULOSKELETAL: No point tenderness. No bulk atrophy. Joints: normal inspection.  SKIN:  No rash. Pressure sore at R pretibial surface. NEUROLOGIC: Doll's eyes intact. Corneal reflex intact. Spontaneous respirations intact. Facial symmetry.  Babinski absent. No sensory deficit. Motor: 5/5 @ RUE, 5/5 @ LUE, 5/5 @ RLL,  5/5 @ LLL.  DTR: 2+ @ R biceps, 2+ @ L biceps, 2+ @ R patellar,  2+ @ L patellar. Gait was not assessed.   Resolved Hospital Problem list      Assessment & Plan:   ASSESSMENT/PLAN:  ASSESSMENT (included in the Hospital Problem List)  Principal Problem:   Acute on chronic respiratory failure with hypoxia and hypercapnia (HCC) Active Problems:   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HCC)   Elevated brain natriuretic peptide (BNP) level   Stage 4 chronic kidney disease (HCC)   Elevated troponin   Hypertensive heart disease   Abnormal CXR   GI bleed   Acute blood loss anemia   OSA (obstructive sleep apnea)   Type 2 diabetes mellitus with stage 4 chronic kidney disease, with long-term current use of insulin (HCC)   Left thyroid nodule   By systems: PULMONARY Acute on chronic hypoxemic and hypercapnic respiratory failure Abnormal CXR: HCAP vs atypical/unilateral pulmonary edema OSA Continue BiPAP Repeat ABG in 1 hour Check procalcitonin Continue empiric antibiotics for possible healthcare-associated pneumonia U/S B LE to rule out DVT   CARDIOVASCULAR Heart failure with preserved ejection fraction/hypertensive heart disease Elevated BNP Elevated troponin (but lower than last check during previous hospitalization) Trend troponin Repeat EKG in  am Trend BNP   RENAL CKD stage IV Monitor urine output Avoid nephrotoxic drugs Renal dosing of medications Furosemide 40 mg IV q 12 hr   GASTROINTESTINAL GI bleed, FOBT + Transfuse as needed Trend hemoglobin GI consult when stable GI PROPHYLAXIS: Protonix   HEMATOLOGIC Acute blood loss anemia Transfuse as needed DVT PROPHYLAXIS: SCDs only with GI bleed   INFECTIOUS Possible healthcare-associated pneumonia Continue cefepime/azithromycin/vancomycin Check procalcitonin   ENDOCRINE Type 2 diabetes mellitus Thyroid nodule, left lobe Sliding scale insulin   NEUROLOGIC Hypoxic encephalopathy Monitor neurologic status for airway compromise   PLAN/RECOMMENDATIONS  Admit to ICU under my service (Attending: Renee Pain, MD) with the diagnoses highlighted above in the active Hospital Problem List (ASSESSMENT). See above     My assessment, plan of care, findings, medications, side effects, etc. were discussed with: nurse.   Best practice:  Diet: NPO Pain/Anxiety/Delirium protocol (if indicated): N/A VAP protocol (if indicated): YES DVT prophylaxis: SCDs GI prophylaxis: Protonix Glucose control: sliding scale insulin Mobility/Activity: bedrest   Code Status: Full Code Family Communication:   no family at  bedside Disposition: admit to ICU   Labs   CBC: Recent Labs  Lab 03/16/21 1953 03/16/21 2035  WBC 21.2*  --   NEUTROABS 17.0*  --   HGB 7.6* 7.1*  HCT 26.9* 21.0*  MCV 100.7*  --   PLT 312  --     Basic Metabolic Panel: Recent Labs  Lab 03/16/21 1953 03/16/21 2035  NA 139 140  K 5.7* 5.5*  CL 108  --   CO2 25  --   GLUCOSE 181*  --   BUN 49*  --   CREATININE 2.64*  --   CALCIUM 8.6*  --    GFR: Estimated Creatinine Clearance: 24.5 mL/min (A) (by C-G formula based on SCr of 2.64 mg/dL (H)). Recent Labs  Lab 03/16/21 1953 03/16/21 1954  WBC 21.2*  --   LATICACIDVEN  --  1.6    Liver Function Tests: Recent Labs  Lab 03/16/21 1953   AST 27  ALT 20  ALKPHOS 75  BILITOT 0.8  PROT 7.0  ALBUMIN 2.9*   No results for input(s): LIPASE, AMYLASE in the last 168 hours. No results for input(s): AMMONIA in the last 168 hours.  ABG    Component Value Date/Time   PHART 7.344 (L) 03/16/2021 2035   PCO2ART 53.0 (H) 03/16/2021 2035   PO2ART 156 (H) 03/16/2021 2035   HCO3 28.9 (H) 03/16/2021 2035   TCO2 31 03/16/2021 2035   ACIDBASEDEF 1.0 01/30/2021 0447   O2SAT 99.0 03/16/2021 2035     Coagulation Profile: No results for input(s): INR, PROTIME in the last 168 hours.  Cardiac Enzymes: No results for input(s): CKTOTAL, CKMB, CKMBINDEX, TROPONINI in the last 168 hours.  HbA1C: Hemoglobin A1C  Date/Time Value Ref Range Status  01/12/2021 01:01 PM 5.8 (A) 4.0 - 5.6 % Final  11/19/2019 07:48 AM 6.0 (A) 4.0 - 5.6 % Final   Hgb A1c MFr Bld  Date/Time Value Ref Range Status  05/12/2020 02:49 AM 6.2 (H) 4.8 - 5.6 % Final    Comment:    (NOTE) Pre diabetes:          5.7%-6.4%  Diabetes:              >6.4%  Glycemic control for   <7.0% adults with diabetes   09/09/2013 11:19 AM 6.9 (H) 4.6 - 6.5 % Final    Comment:    Glycemic Control Guidelines for People with Diabetes:Non Diabetic:  <6%Goal of Therapy: <7%Additional Action Suggested:  >8%     CBG: Recent Labs  Lab 03/10/21 0629 03/10/21 1151  GLUCAP 126* 151*     Past Medical History   Past Medical History:  Diagnosis Date   Altered mental status    a. 05/2017 - adm with blurred vision, somnolence in setting of AKI and high blood sugar.   Anemia    CKD (chronic kidney disease), stage III (HCC)    Diabetes mellitus    Diastolic CHF, chronic ([place])    A.  03/2009 Echo: EF 60-65%, Gr II diast dysfxn   Elevated troponin    a. 2013 - troponin 1.4. b. 2019 - troponin 0.32; neg nuc 07/2017.   Glaucoma    Hyperlipidemia    HYPERCHOLESTEROLEMIA   Hypertension    MARKED LEFT VENTRICULAR HYPERTROPHY BY PREVIOUS ECHOCARDIOGRAM--HE HAS HYPERDYNAMIC LEFT  VENTRICULAR SYSTOLIC FUNCTION AND HAS IMPAIRED RELAXATION BY ECHO   Hypertrophic cardiomyopathy (HCC)    NSVT (nonsustained ventricular tachycardia) (HCC)    Premature atrial contractions    PVC's (  premature ventricular contractions)    SVT (supraventricular tachycardia) [facility])       Surgical History    Past Surgical History:  Procedure Laterality Date   BIOPSY  03/03/2021   Procedure: BIOPSY;  Surgeon: Jackquline Denmark, MD;  Location: [place];  Service: Endoscopy;;   BRONCHIAL BIOPSY  03/05/2021   Procedure: BRONCHIAL BIOPSIES;  Surgeon: Candee Furbish, MD;  Location: [facility] ENDOSCOPY;  Service: Pulmonary;;   BRONCHIAL BRUSHINGS  03/05/2021   Procedure: BRONCHIAL BRUSHINGS;  Surgeon: Candee Furbish, MD;  Location: [facility] ENDOSCOPY;  Service: Pulmonary;;   BRONCHIAL WASHINGS  03/05/2021   Procedure: BRONCHIAL WASHINGS;  Surgeon: Candee Furbish, MD;  Location: [facility] [place] ENDOSCOPY;  Service: Pulmonary;;   ESOPHAGOGASTRODUODENOSCOPY (EGD) WITH PROPOFOL N/A 03/03/2021   Procedure: ESOPHAGOGASTRODUODENOSCOPY (EGD) WITH PROPOFOL;  Surgeon: Jackquline Denmark, MD;  Location: [place];  Service: Endoscopy;  Laterality: N/A;   HEMOSTASIS CONTROL  03/05/2021   Procedure: HEMOSTASIS CONTROL;  Surgeon: Candee Furbish, MD;  Location: [facility] ENDOSCOPY;  Service: Pulmonary;;   NO PAST SURGERIES     VIDEO BRONCHOSCOPY N/A 03/05/2021   Procedure: VIDEO BRONCHOSCOPY WITH FLUORO;  Surgeon: Candee Furbish, MD;  Location: [facility] ENDOSCOPY;  Service: Pulmonary;  Laterality: N/A;      Social History   Social History   Socioeconomic History   Marital status: Legally Separated    Spouse name: Not on file   Number of children: Not on file   Years of education: Not on file   Highest education level: Not on file  Occupational History   Occupation: retired    Fish farm manager: [employer]    Comment: At [place] Use   Smoking status: Never   Smokeless tobacco: Never  Vaping Use   Vaping Use: Never used   Substance and Sexual Activity   Alcohol use: No   Drug use: No   Sexual activity: Never  Other Topics Concern   Not on file  Social History Narrative   Originally from [place].    Social Determinants of Health   Financial Resource Strain: Not on file  Food Insecurity: Not on file  Transportation Needs: Not on file  Physical Activity: Not on file  Stress: Not on file  Social Connections: Not on file      Family History    Family History  Problem Relation Age of Onset   Hypertension Father    Stroke Father    Hypertension Mother    Diabetes Brother    Hypertension Brother    Diabetes Brother    family history includes Diabetes in his brother and brother; Hypertension in his brother, father, and mother; Stroke in his father.    Allergies Allergies  Allergen Reactions   Aspirin Itching    Patient still takes a baby ASA everyday with no issues       Current Medications  Current Facility-Administered Medications:    ceFEPIme (MAXIPIME) 2 g in sodium chloride 0.9 % 100 mL IVPB, 2 g, Intravenous, Q24H, [place], Emma M, RPH, Last Rate: 200 mL/hr at 03/16/21 2217, 2 g at 03/16/21 2217   vancomycin (VANCOREADY) IVPB 1500 mg/300 mL, 1,500 mg, Intravenous, Once, Ralph Dowdy, [facility], Last Rate: 150 mL/hr at 03/16/21 2217, 1,500 mg at 03/16/21 2217  Current Outpatient Medications:    amLODipine (NORVASC) 10 MG tablet, Take 1 tablet (10 mg total) by mouth daily., Disp: 30 tablet, Rfl: 1   aspirin EC 81 MG tablet, Take 1 tablet (81 mg total) by  mouth daily. Swallow whole., Disp: 90 tablet, Rfl: 3   atorvastatin (LIPITOR) 20 MG tablet, Take 1 tablet (20 mg total) by mouth daily., Disp: 90 tablet, Rfl: 2   brimonidine (ALPHAGAN) 0.2 % ophthalmic solution, Place 1 drop into the right eye 2 (two) times daily., Disp: , Rfl:    carvedilol (COREG) 12.5 MG tablet, Take 1 tablet (12.5 mg total) by mouth 2 (two) times daily., Disp: 60 tablet, Rfl: 0   cholecalciferol (VITAMIN D3) 25  MCG (1000 UT) tablet, Take 1,000 Units by mouth daily., Disp: , Rfl:    Continuous Blood Gluc Sensor (DEXCOM G6 SENSOR) MISC, 1 Device by Does not apply route as directed., Disp: 9 each, Rfl: 3   Continuous Blood Gluc Transmit (DEXCOM G6 TRANSMITTER) MISC, 1 Device by Does not apply route as directed., Disp: 1 each, Rfl: 3   diclofenac Sodium (VOLTAREN) 1 % GEL, Apply 4 g topically 4 (four) times daily as needed for pain., Disp: , Rfl:    docusate sodium (COLACE) 100 MG capsule, Take 2 capsules (200 mg total) by mouth 2 (two) times daily., Disp: 60 capsule, Rfl: 0   dorzolamide (TRUSOPT) 2 % ophthalmic solution, Place 1 drop into both eyes 2 (two) times daily., Disp: , Rfl:    doxazosin (CARDURA) 2 MG tablet, Take 2 mg by mouth daily. Patient not sure if still taking, Disp: , Rfl:    furosemide (LASIX) 40 MG tablet, Take 1 tablet (40 mg total) by mouth 2 (two) times daily. (Patient taking differently: Take 40 mg by mouth daily.), Disp: 60 tablet, Rfl: 1   glucose blood (ONETOUCH VERIO) test strip, USE 1 STRIP TO CHECK GLUCOSE THREE TIMES DAILY AS DIRECTED, Disp: 100 each, Rfl: 0   hydrALAZINE (APRESOLINE) 100 MG tablet, Take 1 tablet (100 mg total) by mouth 3 (three) times daily., Disp: 270 tablet, Rfl: 3   Insulin Glargine 300 UNIT/ML SOPN, Inject 20 Units into the skin at bedtime., Disp: , Rfl:    isosorbide mononitrate (IMDUR) 60 MG 24 hr tablet, Take 60 mg by mouth daily., Disp: , Rfl:    latanoprost (XALATAN) 0.005 % ophthalmic solution, Place 1 drop into the left eye at bedtime., Disp: , Rfl:    nitroGLYCERIN (NITROSTAT) 0.4 MG SL tablet, DISSOLVE ONE TABLET UNDER THE TONGUE EVERY 5 MINUTES AS NEEDED FOR CHEST PAIN. (Patient taking differently: Place 0.4 mg under the tongue every 5 (five) minutes as needed for chest pain.), Disp: 25 tablet, Rfl: 7   NOVOLOG RELION 100 UNIT/ML injection, Inject 6 Units into the skin with breakfast, with lunch, and with evening meal., Disp: , Rfl:    pantoprazole  (PROTONIX) 40 MG tablet, Take 1 tablet (40 mg total) by mouth 2 (two) times daily before a meal., Disp: 60 tablet, Rfl: 2   polyethylene glycol (MIRALAX) 17 g packet, Take 17 g by mouth daily., Disp: 28 each, Rfl: 0   Home Medications  Prior to Admission medications   Medication Sig Start Date End Date Taking? Authorizing Provider  amLODipine (NORVASC) 10 MG tablet Take 1 tablet (10 mg total) by mouth daily. 05/13/20   Shawna Clamp, MD  aspirin EC 81 MG tablet Take 1 tablet (81 mg total) by mouth daily. Swallow whole. 03/02/20   Bhagat, Crista Luria, PA  atorvastatin (LIPITOR) 20 MG tablet Take 1 tablet (20 mg total) by mouth daily. 03/16/18   Dorothy Spark, MD  brimonidine (ALPHAGAN) 0.2 % ophthalmic solution Place 1 drop into the right eye 2 (two)  times daily. 10/06/19   [provider]  carvedilol (COREG) 12.5 MG tablet Take 1 tablet (12.5 mg total) by mouth 2 (two) times daily. 03/10/21 04/09/21  Thurnell Lose, MD  cholecalciferol (VITAMIN D3) 25 MCG (1000 UT) tablet Take 1,000 Units by mouth daily.    [provider]  Continuous Blood Gluc Sensor (DEXCOM G6 SENSOR) MISC 1 Device by Does not apply route as directed. 01/12/21   Shamleffer, Melanie Crazier, MD  Continuous Blood Gluc Transmit (DEXCOM G6 TRANSMITTER) MISC 1 Device by Does not apply route as directed. 01/12/21   Shamleffer, Melanie Crazier, MD  diclofenac Sodium (VOLTAREN) 1 % GEL Apply 4 g topically 4 (four) times daily as needed for pain. 02/07/20   [provider]  docusate sodium (COLACE) 100 MG capsule Take 2 capsules (200 mg total) by mouth 2 (two) times daily. 03/10/21   Thurnell Lose, MD  dorzolamide (TRUSOPT) 2 % ophthalmic solution Place 1 drop into both eyes 2 (two) times daily. 12/04/20   [provider]  doxazosin (CARDURA) 2 MG tablet Take 2 mg by mouth daily. Patient not sure if still taking    [provider]  furosemide (LASIX) 40 MG tablet Take 1 tablet (40 mg  total) by mouth 2 (two) times daily. Patient taking differently: Take 40 mg by mouth daily. 05/13/20   Shawna Clamp, MD  glucose blood (ONETOUCH VERIO) test strip USE 1 STRIP TO CHECK GLUCOSE THREE TIMES DAILY AS DIRECTED 07/24/20   Shamleffer, Melanie Crazier, MD  hydrALAZINE (APRESOLINE) 100 MG tablet Take 1 tablet (100 mg total) by mouth 3 (three) times daily. 03/16/20   Skeet Latch, MD  Insulin Glargine 300 UNIT/ML SOPN Inject 20 Units into the skin at bedtime.    [provider]  isosorbide mononitrate (IMDUR) 60 MG 24 hr tablet Take 60 mg by mouth daily. 04/27/20   [provider]  latanoprost (XALATAN) 0.005 % ophthalmic solution Place 1 drop into the left eye at bedtime. 10/06/19   [provider]  nitroGLYCERIN (NITROSTAT) 0.4 MG SL tablet DISSOLVE ONE TABLET UNDER THE TONGUE EVERY 5 MINUTES AS NEEDED FOR CHEST PAIN. Patient taking differently: Place 0.4 mg under the tongue every 5 (five) minutes as needed for chest pain. 03/27/20   Dorothy Spark, MD  NOVOLOG RELION 100 UNIT/ML injection Inject 6 Units into the skin with breakfast, with lunch, and with evening meal. 12/19/20   [provider]  pantoprazole (PROTONIX) 40 MG tablet Take 1 tablet (40 mg total) by mouth 2 (two) times daily before a meal. 03/10/21   Thurnell Lose, MD  polyethylene glycol (MIRALAX) 17 g packet Take 17 g by mouth daily. 03/10/21   Thurnell Lose, MD      Critical care time: 45 minutes.  The treatment and management of the patient's condition was required based on the threat of imminent deterioration. This time reflects time spent by the physician evaluating, providing care and managing the critically ill patient's care. The time was spent at the immediate bedside (or on the same floor/unit and dedicated to this patient's care). Time involved in separately billable procedures is NOT included int he critical care time indicated above. Family meeting and update time may  be included above if and only if the patient is unable/incompetent to participate in clinical interview and/or decision making, and the discussion was necessary to determining treatment decisions.   Renee Pain, MD Board Certified by the ABIM, [place]

## 2021-03-16 NOTE — Progress Notes (Signed)
Pharmacy Antibiotic Note  Robert Chaney is a 70 y.o. male admitted on 03/16/2021 with pneumonia.  Pharmacy has been consulted for cefepime and vancomycin dosing.  WBC 21.2 SCr 2.64 - baseline ~2.4-2.6  Plan: Cefepime 2gm every 24 hours Azithromycin dosing per MD Give vancomycin 1500 mg IV x1 Vancomycin 1250 mg IV every 48 hours.  Goal trough 15-20 mcg/mL.    > eAUC 478, SCr used 2.64, Cmax 34 Vancomycin levels as needed Monitor renal function, CBC, LOT and de-escalate as able  No data recorded.  Recent Labs  Lab 03/16/21 1953 03/16/21 1954  WBC 21.2*  --   LATICACIDVEN  --  1.6    Estimated Creatinine Clearance: 25.5 mL/min (A) (by C-G formula based on SCr of 2.53 mg/dL (H)).    Allergies  Allergen Reactions   Aspirin Itching    Patient still takes a baby ASA everyday with no issues     Antimicrobials this admission: Azithromycin 9/23 >> Cefepime 9/23 >> Vancomycin 9/23 >>   Dose adjustments this admission: none  Microbiology results: none  Thank you for allowing pharmacy to be a part of this patient's care.  Laurey Arrow, PharmD PGY1 Pharmacy Resident 03/16/2021  9:30 PM  Please check [email or web address] for unit-specific pharmacy phone numbers.

## 2021-03-16 NOTE — Progress Notes (Deleted)
Office Visit    Patient Name: Robert Chaney Date of Encounter: 03/16/2021  PCP:  [name], MD   [place]  Cardiologist:  Skeet Latch, MD  Advanced Practice Provider:  No care team member to display Electrophysiologist:  None      Chief Complaint    Robert Chaney is a 70 y.o. male with a hx of *** presents today for hospital follow-up   Past Medical History    Past Medical History:  Diagnosis Date   Altered mental status    a. 05/2017 - adm with blurred vision, somnolence in setting of AKI and high blood sugar.   Anemia    CKD (chronic kidney disease), stage III (HCC)    Diabetes mellitus    Diastolic CHF, chronic ([place])    A.  03/2009 Echo: EF 60-65%, Gr II diast dysfxn   Elevated troponin    a. 2013 - troponin 1.4. b. 2019 - troponin 0.32; neg nuc 07/2017.   Glaucoma    Hyperlipidemia    HYPERCHOLESTEROLEMIA   Hypertension    MARKED LEFT VENTRICULAR HYPERTROPHY BY PREVIOUS ECHOCARDIOGRAM--HE HAS HYPERDYNAMIC LEFT VENTRICULAR SYSTOLIC FUNCTION AND HAS IMPAIRED RELAXATION BY ECHO   Hypertrophic cardiomyopathy (HCC)    NSVT (nonsustained ventricular tachycardia) (HCC)    Premature atrial contractions    PVC's (premature ventricular contractions)    SVT (supraventricular tachycardia) ([place])    Past Surgical History:  Procedure Laterality Date   BIOPSY  03/03/2021   Procedure: BIOPSY;  Surgeon: Jackquline Denmark, MD;  Location: [facility];  Service: Endoscopy;;   BRONCHIAL BIOPSY  03/05/2021   Procedure: BRONCHIAL BIOPSIES;  Surgeon: Candee Furbish, MD;  Location[facility] END[facility]COPY;  Service: Pulmonary;;   BRONCHIAL BRUSHINGS  03/05/2021   Procedure: BRONCHIAL BRUSHINGS;  Surgeon: Candee Furbish, MD;  Location: [facility] ENDOSCOPY;  Service: Pulmonary;;   BRONCHIAL WASHINGS  03/05/2021   Procedure: BRONCHIAL WASHINGS;  Surgeon: Candee Furbish, MD;  Location: [facility] ENDOSCOPY;  Service: Pulmonary;;   ESOPHAGOGASTRODUODENOSCOPY (EGD) WITH PROPOFOL N/A  03/03/2021   Procedure: ESOPHAGOGASTRODUODENOSCOPY (EGD) WITH PROPOFOL;  Surgeon: Jackquline Denmark, MD;  Location: [facility] ENDOSCOPY;  Service: Endoscopy;  Laterality: N/A;   HEMOSTASIS CONTROL  03/05/2021   Procedure: HEMOSTASIS CONTROL;  Surgeon: Candee Furbish, MD;  Location: [facility] ENDOSCOPY;  Service: Pulmonary;;   NO PAST SURGERIES     VIDEO BRONCHOSCOPY N/A 03/05/2021   Procedure: VIDEO BRONCHOSCOPY WITH FLUORO;  Surgeon: Candee Furbish, MD;  Location: [facility] ENDOSCOPY;  Service: Pulmonary;  Laterality: N/A;    Allergies  Allergies  Allergen Reactions   Aspirin Itching    Patient still takes a baby ASA everyday with no issues     History of Present Illness    Robert Chaney is a 70 y.o. male with a hx of *** last seen while hospitalized.   EKGs/Labs/Other Studies Reviewed:   The following studies were reviewed today:  TTE 01/30/2021  1. Peak outflow gradient velocity 3.35 m/s, peak gradient 45 mmHg. Left  ventricular ejection fraction, by estimation, is 70 to 75%. The left  ventricle has hyperdynamic function. The left ventricle has no regional  wall motion abnormalities. There is  severe left ventricular hypertrophy. Left ventricular diastolic parameters  are consistent with Grade I diastolic dysfunction (impaired relaxation).   2. Right ventricular systolic function is normal. The right ventricular  size is normal.   3. The mitral valve is normal in structure. No evidence of mitral valve  regurgitation. No  evidence of mitral stenosis.   4. The aortic valve is normal in structure. Aortic valve regurgitation is  not visualized. No aortic stenosis is present.   5. The inferior vena cava is normal in size with greater than 50%  respiratory variability, suggesting right atrial pressure of 3 mmHg.     EKG:  EKG is ordered today.  The ekg ordered today demonstrates ***  Recent Labs: 03/02/2021: B Natriuretic Peptide 1,340.6 03/09/2021: ALT 29; BUN 51; Creatinine, Ser 2.53; Hemoglobin 8.6;  Magnesium 2.7; Platelets 192; Potassium 4.2; Sodium 136  Recent Lipid Panel    Component Value Date/Time   CHOL 127 05/13/2020 0256   CHOL 157 11/07/2016 0922   TRIG 103 05/13/2020 0256   HDL 43 05/13/2020 0256   HDL 59 11/07/2016 0922   CHOLHDL 3.0 05/13/2020 0256   VLDL 21 05/13/2020 0256   LDLCALC 63 05/13/2020 0256   LDLCALC 82 11/07/2016 0922    Risk Assessment/Calculations:  {Does this patient have ATRIAL FIBRILLATION?:[phone]}  Home Medications   No outpatient medications have been marked as taking for the 03/16/21 encounter (Appointment) with Loel Dubonnet, NP.     Review of Systems   ***   All other systems reviewed and are otherwise negative except as noted above.  Physical Exam    VS:  There were no vitals taken for this visit. , BMI There is no height or weight on file to calculate BMI.  Wt Readings from Last 3 Encounters:  03/10/21 162 lb 0.6 oz (73.5 kg)  01/31/21 171 lb 1.2 oz (77.6 kg)  01/12/21 171 lb (77.6 kg)     GEN: Well nourished, well developed, in no acute distress. HEENT: normal. Neck: Supple, no JVD, carotid bruits, or masses. Cardiac: ***RRR, no murmurs, rubs, or gallops. No clubbing, cyanosis, edema.  ***Radials/PT 2+ and equal bilaterally.  Respiratory:  ***Respirations regular and unlabored, clear to auscultation bilaterally. GI: Soft, nontender, nondistended. MS: No deformity or atrophy. Skin: Warm and dry, no rash. Neuro:  Strength and sensation are intact. Psych: Normal affect.  Assessment & Plan    HFpEF/hypertrophic obstructive cardiomyopathy/hypertension-  AKI -   Disposition: Follow up {follow up:15908} with Dr. Haynes Dage. or APP.  Signed, Loel Dubonnet, NP 03/16/2021, 1:30 PM [place]

## 2021-03-16 NOTE — ED Notes (Signed)
Resp called for pt

## 2021-03-16 NOTE — ED Notes (Signed)
Dr. Joya Gaskins made aware of pt troponin 112

## 2021-03-16 NOTE — ED Notes (Signed)
EDP and RT at bedside. 

## 2021-03-16 NOTE — ED Provider Notes (Addendum)
[facility] EMERGENCY DEPARTMENT Provider Note   CSN: [id number] Arrival date & time: 03/16/21  1942     History Chief Complaint  Patient presents with   Respiratory Distress     Robert Chaney is a 70 y.o. male.  Admitted 02/28/21 through 03/10/21 for pneumonia.  He has a history of hypertrophic cardiomyopathy, diastolic CHF, chronic kidney disease, diabetes, and obstructive sleep apnea on CPAP.  EMS was called to his residence for shortness of breath and chest pain.  Level 5 caveat secondary to altered mental status.   The history is provided by the EMS personnel. The history is limited by the condition of the patient.  Shortness of Breath Severity:  Severe Onset quality:  Sudden Duration:  1 day Timing:  Constant Progression:  Worsening Chronicity:  Recurrent Context comment:  Recent hospitalization for pneumonia Relieved by:  Nothing Worsened by:  Activity Ineffective treatments:  Oxygen (cpap) Associated symptoms: chest pain       Past Medical History:  Diagnosis Date   Altered mental status    a. 05/2017 - adm with blurred vision, somnolence in setting of AKI and high blood sugar.   Anemia    CKD (chronic kidney disease), stage III (HCC)    Diabetes mellitus    Diastolic CHF, chronic ([place])    A.  03/2009 Echo: EF 60-65%, Gr II diast dysfxn   Elevated troponin    a. 2013 - troponin 1.4. b. 2019 - troponin 0.32; neg nuc 07/2017.   Glaucoma    Hyperlipidemia    HYPERCHOLESTEROLEMIA   Hypertension    MARKED LEFT VENTRICULAR HYPERTROPHY BY PREVIOUS ECHOCARDIOGRAM--HE HAS HYPERDYNAMIC LEFT VENTRICULAR SYSTOLIC FUNCTION AND HAS IMPAIRED RELAXATION BY ECHO   Hypertrophic cardiomyopathy (HCC)    NSVT (nonsustained ventricular tachycardia) (HCC)    Premature atrial contractions    PVC's (premature ventricular contractions)    SVT (supraventricular tachycardia) ([place])     Patient Active Problem List   Diagnosis Date Noted   Hypoxic encephalopathy  ([place]) 03/16/2021   Abnormal CXR 03/16/2021   Elevated brain natriuretic peptide (BNP) level 03/16/2021   Left thyroid nodule 03/16/2021   GI bleed 03/16/2021   Acute blood loss anemia 03/16/2021   Acute on chronic respiratory failure with hypoxia and hypercapnia ([place]) 02/28/2021   Melena 02/28/2021   Symptomatic anemia 02/28/2021   Syncope 01/30/2021   Acute on chronic respiratory failure with hypoxemia ([place]) 01/30/2021   Multifocal pneumonia 01/30/2021   Acute on chronic respiratory failure with hypoxia ([place]) 05/14/2020   Hypertensive urgency 02/18/2020   CHF exacerbation ([place]) 01/11/2020   Acute respiratory failure ([place]) 01/11/2020   Type 2 diabetes mellitus with retinopathy, with long-term current use of insulin ([place]) 07/16/2019   Type 2 diabetes mellitus with stage 4 chronic kidney disease, with long-term current use of insulin ([place]) 07/16/2019   Type 2 diabetes mellitus with diabetic polyneuropathy, with long-term current use of insulin ([place]) 07/16/2019   OSA (obstructive sleep apnea) 03/30/2019   Hyperglycemia 07/31/2017   Near syncope 07/31/2017   Acute-on-chronic kidney injury ([place]) 07/31/2017   AKI (acute kidney injury) ([place]) 06/09/2017   Hypertensive heart disease 11/07/2016   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[place]) 07/26/2015   Dyslipidemia 05/19/2013   Diabetic hyperosmolar non-ketotic state ([place]) 07/19/2011   DM (diabetes mellitus) with complications ([place]) 32/67/1245   Stage 4 chronic kidney disease ([place]) 07/19/2011   Elevated troponin 07/19/2011   Prolonged QT interval 07/19/2011   Hyperkalemia 07/19/2011   Volume depletion 07/19/2011  Acute on chronic diastolic CHF (congestive heart failure) ([place]) 07/19/2011   Essential hypertension 07/19/2011   Elevated CPK 07/19/2011   Hypercholesterolemia 02/20/2011   Benign hypertensive heart disease without heart failure 02/20/2011    Past Surgical History:  Procedure Laterality Date   BIOPSY  03/03/2021    Procedure: BIOPSY;  Surgeon: Jackquline Denmark, MD;  Location: [facility];  Service: Endoscopy;;   BRONCHIAL BIOPSY  03/05/2021   Procedure: BRONCHIAL BIOPSIES;  Surgeon: Candee Furbish, MD;  Location: [facility] ENDOSCOPY;  Service: Pulmonary;;   BRONCHIAL BRUSHINGS  03/05/2021   Procedure: BRONCHIAL BRUSHINGS;  Surgeon: Candee Furbish, MD;  Location: [facility] ENDOSCOPY;  Service: Pulmonary;;   BRONCHIAL WASHINGS  03/05/2021   Procedure: BRONCHIAL WASHINGS;  Surgeon: Candee Furbish, MD;  Location: [facility] ENDOSCOPY;  Service: Pulmonary;;   ESOPHAGOGASTRODUODENOSCOPY (EGD) WITH PROPOFOL N/A 03/03/2021   Procedure: ESOPHAGOGASTRODUODENOSCOPY (EGD) WITH PROPOFOL;  Surgeon: Jackquline Denmark, MD;  Location: [facility] ENDOSCOPY;  Service: Endoscopy;  Laterality: N/A;   HEMOSTASIS CONTROL  03/05/2021   Procedure: HEMOSTASIS CONTROL;  Surgeon: Candee Furbish, MD;  Location: [facility] ENDOSCOPY;  Service: Pulmonary;;   NO PAST SURGERIES     VIDEO BRONCHOSCOPY N/A 03/05/2021   Procedure: VIDEO BRONCHOSCOPY WITH FLUORO;  Surgeon: Candee Furbish, MD;  Location: [facility] ENDOSCOPY;  Service: Pulmonary;  Laterality: N/A;       Family History  Problem Relation Age of Onset   Hypertension Father    Stroke Father    Hypertension Mother    Diabetes Brother    Hypertension Brother    Diabetes Brother     Social History   Tobacco Use   Smoking status: Never   Smokeless tobacco: Never  Vaping Use   Vaping Use: Never used  Substance Use Topics   Alcohol use: No   Drug use: No    Home Medications Prior to Admission medications   Medication Sig Start Date End Date Taking? Authorizing Provider  amLODipine (NORVASC) 10 MG tablet Take 1 tablet (10 mg total) by mouth daily. 05/13/20   Shawna Clamp, MD  aspirin EC 81 MG tablet Take 1 tablet (81 mg total) by mouth daily. Swallow whole. 03/02/20   Bhagat, Crista Luria, PA  atorvastatin (LIPITOR) 20 MG tablet Take 1 tablet (20 mg total) by mouth daily. 03/16/18   Dorothy Spark, MD   brimonidine (ALPHAGAN) 0.2 % ophthalmic solution Place 1 drop into the right eye 2 (two) times daily. 10/06/19   [provider]  carvedilol (COREG) 12.5 MG tablet Take 1 tablet (12.5 mg total) by mouth 2 (two) times daily. 03/10/21 04/09/21  Thurnell Lose, MD  cholecalciferol (VITAMIN D3) 25 MCG (1000 UT) tablet Take 1,000 Units by mouth daily.    [provider]  Continuous Blood Gluc Sensor (DEXCOM G6 SENSOR) MISC 1 Device by Does not apply route as directed. 01/12/21   Shamleffer, Melanie Crazier, MD  Continuous Blood Gluc Transmit (DEXCOM G6 TRANSMITTER) MISC 1 Device by Does not apply route as directed. 01/12/21   Shamleffer, Melanie Crazier, MD  diclofenac Sodium (VOLTAREN) 1 % GEL Apply 4 g topically 4 (four) times daily as needed for pain. 02/07/20   [provider]  docusate sodium (COLACE) 100 MG capsule Take 2 capsules (200 mg total) by mouth 2 (two) times daily. 03/10/21   Thurnell Lose, MD  dorzolamide (TRUSOPT) 2 % ophthalmic solution Place 1 drop into both eyes 2 (two) times daily. 12/04/20   [provider]  doxazosin (CARDURA) 2 MG  tablet Take 2 mg by mouth daily. Patient not sure if still taking    [provider]  furosemide (LASIX) 40 MG tablet Take 1 tablet (40 mg total) by mouth 2 (two) times daily. Patient taking differently: Take 40 mg by mouth daily. 05/13/20   Shawna Clamp, MD  glucose blood (ONETOUCH VERIO) test strip USE 1 STRIP TO CHECK GLUCOSE THREE TIMES DAILY AS DIRECTED 07/24/20   Shamleffer, Melanie Crazier, MD  hydrALAZINE (APRESOLINE) 100 MG tablet Take 1 tablet (100 mg total) by mouth 3 (three) times daily. 03/16/20   Skeet Latch, MD  Insulin Glargine 300 UNIT/ML SOPN Inject 20 Units into the skin at bedtime.    [provider]  isosorbide mononitrate (IMDUR) 60 MG 24 hr tablet Take 60 mg by mouth daily. 04/27/20   [provider]  latanoprost (XALATAN) 0.005 % ophthalmic solution Place 1 drop  into the left eye at bedtime. 10/06/19   [provider]  nitroGLYCERIN (NITROSTAT) 0.4 MG SL tablet DISSOLVE ONE TABLET UNDER THE TONGUE EVERY 5 MINUTES AS NEEDED FOR CHEST PAIN. Patient taking differently: Place 0.4 mg under the tongue every 5 (five) minutes as needed for chest pain. 03/27/20   Dorothy Spark, MD  NOVOLOG RELION 100 UNIT/ML injection Inject 6 Units into the skin with breakfast, with lunch, and with evening meal. 12/19/20   [provider]  pantoprazole (PROTONIX) 40 MG tablet Take 1 tablet (40 mg total) by mouth 2 (two) times daily before a meal. 03/10/21   Thurnell Lose, MD  polyethylene glycol (MIRALAX) 17 g packet Take 17 g by mouth daily. 03/10/21   Thurnell Lose, MD    Allergies    Aspirin  Review of Systems   Review of Systems  Unable to perform ROS: Acuity of condition  Respiratory:  Positive for shortness of breath.   Cardiovascular:  Positive for chest pain.   Physical Exam Updated Vital Signs BP 136/65   Pulse (!) 58   Temp (!) 97.5 F (36.4 C) (Axillary)   Resp 15   SpO2 99%   Physical Exam Constitutional:      General: He is in acute distress.     Appearance: He is ill-appearing.  HENT:     Head: Normocephalic and atraumatic.     Nose:     Comments: Nasal airway; bloody mucous at nare on right    Mouth/Throat:     Mouth: Mucous membranes are dry.  Eyes:     Conjunctiva/sclera: Conjunctivae normal.     Pupils: Pupils are equal, round, and reactive to light.  Cardiovascular:     Rate and Rhythm: Normal rate and regular rhythm.     Heart sounds: Normal heart sounds.  Pulmonary:     Effort: Respiratory distress present.     Breath sounds: Rhonchi present.     Comments: Diffuse, severe rhonchi in all lung fields Abdominal:     Palpations: Abdomen is soft.     Tenderness: There is no abdominal tenderness.  Genitourinary:    Comments: Incontinent of stool which appears to have gross blood mixed in with  stool Musculoskeletal:     Right lower leg: Edema present.     Left lower leg: Edema present.     Comments: Pitting edema to the knees bilaterally  Skin:    Findings: No rash.     Comments: Cool  Neurological:     Mental Status: He is lethargic.     GCS: GCS eye subscore is  2. GCS verbal subscore is 3. GCS motor subscore is 6.    ED Results / Procedures / Treatments   Labs (all labs ordered are listed, but only abnormal results are displayed) Labs Reviewed  CBC WITH DIFFERENTIAL/PLATELET - Abnormal; Notable for the following components:      Result Value   WBC 21.2 (*)    RBC 2.67 (*)    Hemoglobin 7.6 (*)    HCT 26.9 (*)    MCV 100.7 (*)    MCHC 28.3 (*)    RDW 16.4 (*)    Neutro Abs 17.0 (*)    Monocytes Absolute 1.3 (*)    Abs Immature Granulocytes 0.11 (*)    All other components within normal limits  BRAIN NATRIURETIC PEPTIDE - Abnormal; Notable for the following components:   B Natriuretic Peptide 1,475.0 (*)    All other components within normal limits  D-DIMER, QUANTITATIVE - Abnormal; Notable for the following components:   D-Dimer, Quant 5.80 (*)    All other components within normal limits  COMPREHENSIVE METABOLIC PANEL - Abnormal; Notable for the following components:   Potassium 5.7 (*)    Glucose, Bld 181 (*)    BUN 49 (*)    Creatinine, Ser 2.64 (*)    Calcium 8.6 (*)    Albumin 2.9 (*)    GFR, Estimated 25 (*)    All other components within normal limits  CBC - Abnormal; Notable for the following components:   WBC 10.6 (*)    RBC 2.18 (*)    Hemoglobin 6.1 (*)    HCT 21.7 (*)    MCHC 28.1 (*)    RDW 16.3 (*)    All other components within normal limits  CREATININE, SERUM - Abnormal; Notable for the following components:   Creatinine, Ser 2.70 (*)    GFR, Estimated 24 (*)    All other components within normal limits  I-STAT ARTERIAL BLOOD GAS, ED - Abnormal; Notable for the following components:   pH, Arterial 7.344 (*)    pCO2 arterial 53.0  (*)    pO2, Arterial 156 (*)    Bicarbonate 28.9 (*)    Acid-Base Excess 3.0 (*)    Potassium 5.5 (*)    HCT 21.0 (*)    Hemoglobin 7.1 (*)    All other components within normal limits  POC OCCULT BLOOD, ED - Abnormal; Notable for the following components:   Fecal Occult Bld POSITIVE (*)    All other components within normal limits  TROPONIN I (HIGH SENSITIVITY) - Abnormal; Notable for the following components:   Troponin I (High Sensitivity) 112 (*)    All other components within normal limits  TROPONIN I (HIGH SENSITIVITY) - Abnormal; Notable for the following components:   Troponin I (High Sensitivity) 98 (*)    All other components within normal limits  RESP PANEL BY RT-PCR (FLU A&B, COVID) ARPGX2  EXPECTORATED SPUTUM ASSESSMENT W GRAM STAIN, RFLX TO RESP C  LACTIC ACID, PLASMA  LACTIC ACID, PLASMA  BASIC METABOLIC PANEL  PROCALCITONIN  CBC  BLOOD GAS, ARTERIAL  MAGNESIUM  STREP PNEUMONIAE URINARY ANTIGEN  LEGIONELLA PNEUMOPHILA SEROGP 1 UR AG  HEMOGLOBIN A1C  TYPE AND SCREEN  PREPARE RBC (CROSSMATCH)  PREPARE RBC (CROSSMATCH)    EKG EKG Interpretation  Date/Time:  Friday March 16 2021 20:13:27 EDT Ventricular Rate:  66 PR Interval:  156 QRS Duration: 85 QT Interval:  397 QTC Calculation: 416 R Axis:   92 Text Interpretation: Sinus rhythm Right axis  deviation Consider left ventricular hypertrophy hyperacute T waves in V3, V4 have replaced inverted T waves from 02/28/21 Confirmed by Lorre Munroe ([id number]) on 03/16/2021 8:52:47 PM  Radiology DG Chest Port 1 View  Result Date: 03/16/2021 CLINICAL DATA:  Dyspnea. EXAM: PORTABLE CHEST 1 VIEW COMPARISON:  March 05, 2021 FINDINGS: Marked severity infiltrate is seen within the mid right lung with moderate severity infiltrate noted along the right lung base. This represents a new finding when compared to the prior study. There is no evidence of a pleural effusion or pneumothorax. The heart size and mediastinal contours  are within normal limits. Marked severity calcification of the thoracic aorta is seen. Multilevel degenerative changes are noted throughout the thoracic spine. IMPRESSION: Moderate to marked severity infiltrates within the mid right lung and right lung base. Electronically Signed   By: Virgina Norfolk M.D.   On: 03/16/2021 20:57    Procedures Procedures   Medications Ordered in ED Medications  ceFEPIme (MAXIPIME) 2 g in sodium chloride 0.9 % 100 mL IVPB (0 g Intravenous Stopped 03/16/21 2251)  vancomycin (VANCOREADY) IVPB 1500 mg/300 mL (1,500 mg Intravenous New Bag/Given 03/16/21 2217)  docusate sodium (COLACE) capsule 100 mg (has no administration in time range)  polyethylene glycol (MIRALAX / GLYCOLAX) packet 17 g (has no administration in time range)  albuterol (PROVENTIL) (2.5 MG/3ML) 0.083% nebulizer solution 2.5 mg (has no administration in time range)  pantoprazole (PROTONIX) injection 40 mg (has no administration in time range)  0.9 %  sodium chloride infusion (Manually program via Guardrails IV Fluids) (has no administration in time range)  insulin aspart (novoLOG) injection 0-9 Units (has no administration in time range)  furosemide (LASIX) injection 40 mg (has no administration in time range)  0.9 %  sodium chloride infusion (Manually program via Guardrails IV Fluids) (has no administration in time range)  azithromycin (ZITHROMAX) 500 mg in sodium chloride 0.9 % 250 mL IVPB (0 mg Intravenous Stopped 03/16/21 2213)  furosemide (LASIX) injection 80 mg (80 mg Intravenous Given 03/16/21 2115)    ED Course  I have reviewed the triage vital signs and the nursing notes.  Pertinent labs & imaging results that were available during my care of the patient were reviewed by me and considered in my medical decision making (see chart for details).  Clinical Course as of 03/16/21 2355  Fri Mar 16, 2021  2217 Pulmonary and critical care will see the patient. [AW]    Clinical Course User  Index [AW] Arnaldo Natal, MD   MDM Rules/Calculators/A&P                           Robert Chaney presents with acute respiratory failure.  He was placed on BiPAP immediately.  He was given antibiotics for possible healthcare associated pneumonia given his worsening chest x-ray findings.  Patient was also noted to have gross rectal bleeding.  He was given some Lasix for volume overload.  ICU was consulted due to the critical nature of his condition. Final Clinical Impression(s) / ED Diagnoses Final diagnoses:  Healthcare-associated pneumonia  Acute respiratory failure with hypoxia (HCC)  Gastrointestinal hemorrhage, unspecified gastrointestinal hemorrhage type  Anemia, unspecified type  Acute on chronic diastolic CHF (congestive heart failure) (HCC)  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HCC)  Acute renal failure superimposed on chronic kidney disease, unspecified CKD stage, unspecified acute renal failure type ([place])    Rx / DC Orders ED Discharge Orders     None  Arnaldo Natal, MD 03/16/21 2351    Arnaldo Natal, MD 03/16/21 [phone]

## 2021-03-16 NOTE — ED Notes (Signed)
BiPAP placed on pt by RT.

## 2021-03-17 ENCOUNTER — Inpatient Hospital Stay (HOSPITAL_COMMUNITY): Payer: PPO

## 2021-03-17 DIAGNOSIS — K2970 Gastritis, unspecified, without bleeding: Secondary | ICD-10-CM | POA: Diagnosis not present

## 2021-03-17 DIAGNOSIS — J9622 Acute and chronic respiratory failure with hypercapnia: Secondary | ICD-10-CM | POA: Diagnosis not present

## 2021-03-17 DIAGNOSIS — K921 Melena: Secondary | ICD-10-CM | POA: Diagnosis not present

## 2021-03-17 DIAGNOSIS — B9681 Helicobacter pylori [H. pylori] as the cause of diseases classified elsewhere: Secondary | ICD-10-CM | POA: Diagnosis not present

## 2021-03-17 DIAGNOSIS — K601 Chronic anal fissure: Secondary | ICD-10-CM

## 2021-03-17 DIAGNOSIS — J9621 Acute and chronic respiratory failure with hypoxia: Secondary | ICD-10-CM | POA: Diagnosis not present

## 2021-03-17 DIAGNOSIS — R609 Edema, unspecified: Secondary | ICD-10-CM

## 2021-03-17 LAB — POCT I-STAT 7, (LYTES, BLD GAS, ICA,H+H)
Acid-Base Excess: 0 mmol/L (ref 0.0–2.0)
Bicarbonate: 26.1 mmol/L (ref 20.0–28.0)
Calcium, Ion: 1.23 mmol/L (ref 1.15–1.40)
HCT: 23 % — ABNORMAL LOW (ref 39.0–52.0)
Hemoglobin: 7.8 g/dL — ABNORMAL LOW (ref 13.0–17.0)
O2 Saturation: 94 %
Patient temperature: 98.7
Potassium: 5 mmol/L (ref 3.5–5.1)
Sodium: 142 mmol/L (ref 135–145)
TCO2: 28 mmol/L (ref 22–32)
pCO2 arterial: 47.2 mmHg (ref 32.0–48.0)
pH, Arterial: 7.352 (ref 7.350–7.450)
pO2, Arterial: 74 mmHg — ABNORMAL LOW (ref 83.0–108.0)

## 2021-03-17 LAB — CBC
HCT: 21.7 % — ABNORMAL LOW (ref 39.0–52.0)
HCT: 24.8 % — ABNORMAL LOW (ref 39.0–52.0)
Hemoglobin: 6.1 g/dL — CL (ref 13.0–17.0)
Hemoglobin: 7.2 g/dL — ABNORMAL LOW (ref 13.0–17.0)
MCH: 28 pg (ref 26.0–34.0)
MCH: 28.5 pg (ref 26.0–34.0)
MCHC: 28.1 g/dL — ABNORMAL LOW (ref 30.0–36.0)
MCHC: 29 g/dL — ABNORMAL LOW (ref 30.0–36.0)
MCV: 99.5 fL (ref 80.0–100.0)
MCV: 98 fL (ref 80.0–100.0)
Platelets: 189 10*3/uL (ref 150–400)
Platelets: 202 10*3/uL (ref 150–400)
RBC: 2.18 MIL/uL — ABNORMAL LOW (ref 4.22–5.81)
RBC: 2.53 MIL/uL — ABNORMAL LOW (ref 4.22–5.81)
RDW: 16.3 % — ABNORMAL HIGH (ref 11.5–15.5)
RDW: 16.3 % — ABNORMAL HIGH (ref 11.5–15.5)
WBC: 10.6 10*3/uL — ABNORMAL HIGH (ref 4.0–10.5)
WBC: 15 10*3/uL — ABNORMAL HIGH (ref 4.0–10.5)
nRBC: 0 % (ref 0.0–0.2)
nRBC: 0 % (ref 0.0–0.2)

## 2021-03-17 LAB — BASIC METABOLIC PANEL
Anion gap: 7 (ref 5–15)
BUN: 51 mg/dL — ABNORMAL HIGH (ref 8–23)
CO2: 26 mmol/L (ref 22–32)
Calcium: 8.7 mg/dL — ABNORMAL LOW (ref 8.9–10.3)
Chloride: 106 mmol/L (ref 98–111)
Creatinine, Ser: 2.67 mg/dL — ABNORMAL HIGH (ref 0.61–1.24)
GFR, Estimated: 25 mL/min — ABNORMAL LOW (ref >60)
Glucose, Bld: 153 mg/dL — ABNORMAL HIGH (ref 70–99)
Potassium: 5 mmol/L (ref 3.5–5.1)
Sodium: 139 mmol/L (ref 135–145)

## 2021-03-17 LAB — GLUCOSE, CAPILLARY
Glucose-Capillary: 126 mg/dL — ABNORMAL HIGH (ref 70–99)
Glucose-Capillary: 138 mg/dL — ABNORMAL HIGH (ref 70–99)
Glucose-Capillary: 140 mg/dL — ABNORMAL HIGH (ref 70–99)
Glucose-Capillary: 140 mg/dL — ABNORMAL HIGH (ref 70–99)
Glucose-Capillary: 148 mg/dL — ABNORMAL HIGH (ref 70–99)
Glucose-Capillary: 202 mg/dL — ABNORMAL HIGH (ref 70–99)

## 2021-03-17 LAB — MRSA NEXT GEN BY PCR, NASAL: MRSA by PCR Next Gen: NOT DETECTED

## 2021-03-17 LAB — HEMOGLOBIN AND HEMATOCRIT, BLOOD
HCT: 27.7 % — ABNORMAL LOW (ref 39.0–52.0)
Hemoglobin: 8.8 g/dL — ABNORMAL LOW (ref 13.0–17.0)

## 2021-03-17 LAB — PROTIME-INR
INR: 1.2 (ref 0.8–1.2)
Prothrombin Time: 15 seconds (ref 11.4–15.2)

## 2021-03-17 LAB — MAGNESIUM: Magnesium: 2.3 mg/dL (ref 1.7–2.4)

## 2021-03-17 LAB — RAPID URINE DRUG SCREEN, HOSP PERFORMED
Amphetamines: NOT DETECTED
Barbiturates: NOT DETECTED
Benzodiazepines: NOT DETECTED
Cocaine: NOT DETECTED
Opiates: NOT DETECTED
Tetrahydrocannabinol: NOT DETECTED

## 2021-03-17 LAB — PROCALCITONIN: Procalcitonin: 9.09 ng/mL

## 2021-03-17 LAB — APTT: aPTT: 33 seconds (ref 24–36)

## 2021-03-17 LAB — STREP PNEUMONIAE URINARY ANTIGEN: Strep Pneumo Urinary Antigen: NEGATIVE

## 2021-03-17 MED ORDER — INSULIN ASPART 100 UNIT/ML IJ SOLN
0.0000 [IU] | Freq: Three times a day (TID) | INTRAMUSCULAR | Status: DC
  Administered 2021-03-17: 1 [IU] via SUBCUTANEOUS
  Administered 2021-03-18 (×2): 2 [IU] via SUBCUTANEOUS
  Administered 2021-03-18 – 2021-03-19 (×2): 1 [IU] via SUBCUTANEOUS
  Administered 2021-03-19 – 2021-03-21 (×8): 2 [IU] via SUBCUTANEOUS
  Administered 2021-03-21: 1 [IU] via SUBCUTANEOUS
  Administered 2021-03-21 – 2021-03-22 (×2): 3 [IU] via SUBCUTANEOUS
  Administered 2021-03-22: 2 [IU] via SUBCUTANEOUS

## 2021-03-17 MED ORDER — CARVEDILOL 12.5 MG PO TABS: 12.5000 mg | ORAL_TABLET | Freq: Two times a day (BID) | ORAL | Status: DC

## 2021-03-17 MED ORDER — FUROSEMIDE 10 MG/ML IJ SOLN
40.0000 mg | Freq: Once | INTRAMUSCULAR | Status: AC
  Administered 2021-03-17: 40 mg via INTRAVENOUS
  Filled 2021-03-17: qty 4

## 2021-03-17 MED ORDER — AMLODIPINE BESYLATE 5 MG PO TABS
5.0000 mg | ORAL_TABLET | Freq: Every day | ORAL | Status: DC
  Administered 2021-03-17: 5 mg via ORAL
  Filled 2021-03-17: qty 1

## 2021-03-17 MED ORDER — CLARITHROMYCIN 250 MG PO TABS
250.0000 mg | ORAL_TABLET | Freq: Two times a day (BID) | ORAL | Status: DC
  Administered 2021-03-17: 250 mg
  Filled 2021-03-17: qty 1

## 2021-03-17 MED ORDER — CARVEDILOL 12.5 MG PO TABS
12.5000 mg | ORAL_TABLET | Freq: Two times a day (BID) | ORAL | Status: DC
  Administered 2021-03-17 – 2021-03-20 (×7): 12.5 mg via ORAL
  Filled 2021-03-17 (×7): qty 1

## 2021-03-17 MED ORDER — CARVEDILOL 12.5 MG PO TABS
6.2500 mg | ORAL_TABLET | Freq: Two times a day (BID) | ORAL | Status: DC
  Administered 2021-03-17: 6.25 mg via ORAL
  Filled 2021-03-17: qty 1

## 2021-03-17 MED ORDER — CLARITHROMYCIN 125 MG/5ML PO SUSR: 250.0000 mg | Freq: Two times a day (BID) | ORAL | Status: DC

## 2021-03-17 MED ORDER — GUAIFENESIN 100 MG/5ML PO SOLN
5.0000 mL | Freq: Four times a day (QID) | ORAL | Status: AC | PRN
  Administered 2021-03-17 – 2021-03-18 (×3): 100 mg via ORAL
  Filled 2021-03-17 (×3): qty 5

## 2021-03-17 MED ORDER — CLARITHROMYCIN 250 MG PO TABS
250.0000 mg | ORAL_TABLET | Freq: Two times a day (BID) | ORAL | Status: DC
  Filled 2021-03-17: qty 1

## 2021-03-17 MED ORDER — METRONIDAZOLE 50 MG/ML ORAL SUSPENSION
500.0000 mg | Freq: Three times a day (TID) | ORAL | Status: DC
  Administered 2021-03-17: 500 mg
  Filled 2021-03-17: qty 10

## 2021-03-17 MED ORDER — POLYETHYLENE GLYCOL 3350 17 G PO PACK
17.0000 g | PACK | Freq: Every day | ORAL | Status: DC
  Administered 2021-03-17: 17 g via ORAL

## 2021-03-17 MED ORDER — DILTIAZEM GEL 2 %
Freq: Four times a day (QID) | CUTANEOUS | Status: DC
  Administered 2021-03-17 – 2021-03-19 (×5): 1 via TOPICAL
  Filled 2021-03-17: qty 30

## 2021-03-17 MED ORDER — LABETALOL HCL 5 MG/ML IV SOLN
10.0000 mg | INTRAVENOUS | Status: DC | PRN
  Administered 2021-03-17 – 2021-03-19 (×7): 10 mg via INTRAVENOUS
  Filled 2021-03-17 (×9): qty 4

## 2021-03-17 MED ORDER — FUROSEMIDE 10 MG/ML IJ SOLN: 40.0000 mg | Freq: Once | INTRAMUSCULAR | Status: DC

## 2021-03-17 MED ORDER — HYDRALAZINE HCL 20 MG/ML IJ SOLN: 10.0000 mg | INTRAMUSCULAR | Status: DC | PRN

## 2021-03-17 MED ORDER — CHLORHEXIDINE GLUCONATE CLOTH 2 % EX PADS
6.0000 | MEDICATED_PAD | Freq: Every day | CUTANEOUS | Status: DC
  Administered 2021-03-17 – 2021-03-19 (×4): 6 via TOPICAL

## 2021-03-17 MED ORDER — METRONIDAZOLE 50 MG/ML ORAL SUSPENSION
500.0000 mg | Freq: Three times a day (TID) | ORAL | Status: DC
  Filled 2021-03-17 (×2): qty 10

## 2021-03-17 NOTE — Progress Notes (Signed)
New Home Progress Note Patient Name: Robert Chaney DOB: [birth year] MRN: [id number]   Date of Service  03/17/2021  HPI/Events of Note  Nursing concerned about patient passing blood per rectum. BP =  178/81 and HR = 63. Patient getting 1 unit PRBC. Last Hgb = 7.8.   eICU Interventions  Plan: Place SCD's. PT/INR and PTT STAT. CBC already ordered after blood transfusion.      Intervention Category Major Interventions: Other:  Lysle Dingwall 03/17/2021, 6:16 AM

## 2021-03-17 NOTE — Progress Notes (Signed)
Telephone consent for blood administration obtained from patient's brother, [name] [place]. He states he is not aware of any reason why patient wouldn't want to receive blood products as he has received them on previous admissions to the hospital. Verified and witness by this RN and Humberto Leep, Agricultural consultant. Hard copy placed in shadow chart. RN will proceed with administering blood product as ordered.

## 2021-03-17 NOTE — H&P (Signed)
NAME:  Robert Chaney MRN:  [id number] DOB:  January 30, 1951 LOS: 1 ADMISSION DATE:  03/16/2021 DATE OF SERVICE:  03/16/2021  CHIEF COMPLAINT:  dyspnea, altered mental status   HISTORY & PHYSICAL  History of Present Illness  This 70 y.o. African American male nonsmoker presented to the [facility] Emergency Department via EMS with complaints of respiratory distress.  Per EMS, the patient was found to be hypoxic.  SpO2 73% despite non-rebreather mask. He was reportedly placed on CPAP and subsequently became unresponsive.  He was supported with bag-mask ventilation and transported to the ER.  He was given DuoNeb and SoluMedrol 125 mg IV single dose.  Of note, he was hypertensive (260/110) and tachycardic (100).  In the ER, the patient has been placed on BiPAP.  He was also noted to be incontinent of bloody stool.  PCCM asked to admit.  Of note, the patient was recently hospitalized for pneumonia and was discharged on 9/17 and was treated with cefepime/vancomycin which was switched to Unasyn.  He underwent bronch/BAL/TBBx/brushings 03/05/21. All unrevealing but no BAL cell count/diff obtained. He is also noted to have HFpER/hypertensive heart disease with diastolic dysfunction.  At the time of clinical interview, the patient is on BiPAP, FiO2 50%.  SpO2 100%.  He is arousable to loud voice and noxious stimuli but does not stay awake for clinical interview.  REVIEW OF SYSTEMS This patient is critically ill and cannot provide additional history nor review of systems due to mental status/unconsciousness.   Past Medical/Surgical/Social/Family History   Past Medical History:  Diagnosis Date   Altered mental status    a. 05/2017 - adm with blurred vision, somnolence in setting of AKI and high blood sugar.   Anemia    CKD (chronic kidney disease), stage III (HCC)    Diabetes mellitus    Diastolic CHF, chronic ([place])    A.  03/2009 Echo: EF 60-65%, Gr II diast dysfxn   Elevated troponin     a. 2013 - troponin 1.4. b. 2019 - troponin 0.32; neg nuc 07/2017.   Glaucoma    Hyperlipidemia    HYPERCHOLESTEROLEMIA   Hypertension    MARKED LEFT VENTRICULAR HYPERTROPHY BY PREVIOUS ECHOCARDIOGRAM--HE HAS HYPERDYNAMIC LEFT VENTRICULAR SYSTOLIC FUNCTION AND HAS IMPAIRED RELAXATION BY ECHO   Hypertrophic cardiomyopathy (HCC)    NSVT (nonsustained ventricular tachycardia) (HCC)    Premature atrial contractions    PVC's (premature ventricular contractions)    SVT (supraventricular tachycardia) ([place])     Past Surgical History:  Procedure Laterality Date   BIOPSY  03/03/2021   Procedure: BIOPSY;  Surgeon: Jackquline Denmark, MD;  Location: [facility];  Service: Endoscopy;;   BRONCHIAL BIOPSY  03/05/2021   Procedure: BRONCHIAL BIOPSIES;  Surgeon: Candee Furbish, MD;  Location: [facility] ENDOSCOPY;  Service: Pulmonary;;   BRONCHIAL BRUSHINGS  03/05/2021   Procedure: BRONCHIAL BRUSHINGS;  Surgeon: Candee Furbish, MD;  Location: [facility] ENDOSCOPY;  Service: Pulmonary;;   BRONCHIAL WASHINGS  03/05/2021   Procedure: BRONCHIAL WASHINGS;  Surgeon: Candee Furbish, MD;  Location: [facility] ENDOSCOPY;  Service: Pulmonary;;   ESOPHAGOGASTRODUODENOSCOPY (EGD) WITH PROPOFOL N/A 03/03/2021   Procedure: ESOPHAGOGASTRODUODENOSCOPY (EGD) WITH PROPOFOL;  Surgeon: Jackquline Denmark, MD;  Location: [facility] ENDOSCOPY;  Service: Endoscopy;  Laterality: N/A;   HEMOSTASIS CONTROL  03/05/2021   Procedure: HEMOSTASIS CONTROL;  Surgeon: Candee Furbish, MD;  Location: [facility] ENDOSCOPY;  Service: Pulmonary;;   NO PAST SURGERIES     VIDEO BRONCHOSCOPY N/A 03/05/2021   Procedure: VIDEO  BRONCHOSCOPY WITH FLUORO;  Surgeon: Candee Furbish, MD;  Location[facility] END[facility]COPY;  Service: Pulmonary;  Laterality: N/A;    Social History   Tobacco Use   Smoking status: Never   Smokeless tobacco: Never  Substance Use Topics   Alcohol use: No    Family History  Problem Relation Age of Onset   Hypertension Father    Stroke Father    Hypertension Mother     Diabetes Brother    Hypertension Brother    Diabetes Brother      Procedures:  9/10 EGD with mild gastritis  9/12 bronch/BAL/TBBx/brushings all unrevealing. No BAL cell count/diff.   Significant Diagnostic Tests:     Micro Data:   Results for orders placed or performed during the hospital encounter of 03/16/21  Resp Panel by RT-PCR (Flu A&B, Covid) Nasopharyngeal Swab     Status: None   Collection Time: 03/16/21  7:54 PM   Specimen: Nasopharyngeal Swab; Nasopharyngeal(NP) swabs in vial transport medium  Result Value Ref Range Status   SARS Coronavirus 2 by RT PCR NEGATIVE NEGATIVE Final    Comment: (NOTE) SARS-CoV-2 target nucleic acids are NOT DETECTED.  The SARS-CoV-2 RNA is generally detectable in upper respiratory specimens during the acute phase of infection. The lowest concentration of SARS-CoV-2 viral copies this assay can detect is 138 copies/mL. A negative result does not preclude SARS-Cov-2 infection and should not be used as the sole basis for treatment or other patient management decisions. A negative result may occur with  improper specimen collection/handling, submission of specimen other than nasopharyngeal swab, presence of viral mutation(s) within the areas targeted by this assay, and inadequate number of viral copies(<138 copies/mL). A negative result must be combined with clinical observations, patient history, and epidemiological information. The expected result is Negative.  Fact Sheet for Patients:  EntrepreneurPulse.com.au  Fact Sheet for Healthcare Providers:  IncredibleEmployment.be  This test is no t yet approved or cleared by the [place] FDA and  has been authorized for detection and/or diagnosis of SARS-CoV-2 by FDA under an Emergency Use Authorization (EUA). This EUA will remain  in effect (meaning this test can be used) for the duration of the COVID-19 declaration under Section 564(b)(1) of the  Act, 21 U.S.C.section 360bbb-3(b)(1), unless the authorization is terminated  or revoked sooner.       Influenza A by PCR NEGATIVE NEGATIVE Final   Influenza B by PCR NEGATIVE NEGATIVE Final    Comment: (NOTE) The Xpert Xpress SARS-CoV-2/FLU/RSV plus assay is intended as an aid in the diagnosis of influenza from Nasopharyngeal swab specimens and should not be used as a sole basis for treatment. Nasal washings and aspirates are unacceptable for Xpert Xpress SARS-CoV-2/FLU/RSV testing.  Fact Sheet for Patients: EntrepreneurPulse.com.au  Fact Sheet for Healthcare Providers: IncredibleEmployment.be  This test is not yet approved or cleared by the [place] FDA and has been authorized for detection and/or diagnosis of SARS-CoV-2 by FDA under an Emergency Use Authorization (EUA). This EUA will remain in effect (meaning this test can be used) for the duration of the COVID-19 declaration under Section 564(b)(1) of the Act, 21 U.S.C. section 360bbb-3(b)(1), unless the authorization is terminated or revoked.  Performed at [facility], [place] [address] [place], [place] [address]   MRSA Next Gen by PCR, Nasal     Status: None   Collection Time: 03/17/21 12:00 AM  Result Value Ref Range Status   MRSA by PCR Next Gen NOT DETECTED NOT DETECTED Final  Comment: (NOTE) The GeneXpert MRSA Assay (FDA approved for NASAL specimens only), is one component of a comprehensive MRSA colonization surveillance program. It is not intended to diagnose MRSA infection nor to guide or monitor treatment for MRSA infections. Test performance is not FDA approved in patients less than 36 years old. Performed at [facility], [place] [address] [place], [place] [address]       Antimicrobials:  Cefepime/azithromycin/vancomcyin (9/23>>)    Interim history/subjective:  Feeling less dyspneic this morning, comfortable with current BiPAP settings. Not  coughing anything up.    Objective   BP (!) 174/80   Pulse 63   Temp 97.7 F (36.5 C) (Axillary)   Resp 15   Wt 75.9 kg   SpO2 98%   BMI 25.44 kg/m     Filed Weights   03/17/21 0017 03/17/21 0500  Weight: 75.6 kg 75.9 kg    Intake/Output Summary (Last 24 hours) at 03/17/2021 6045 Last data filed at 03/17/2021 0615 Gross per 24 hour  Intake 735 ml  Output 2400 ml  Net -1665 ml    FiO2 (%):  [40 %-100 %] 40 %   Examination: General appearance: 70 y.o., male, NAD, drowsy but easily arousable Eyes: anicteric sclerae, PERRL, tracking  HENT: NCAT; dry MM on bipap Neck: Trachea midline; no lymphadenopathy Lungs: inspiratory squeaks and expiratory wheeze on left, with normal respiratory effort on BiPAP CV: RRR, no MRGs  Abdomen: Soft, non-tender; non-distended, BS present  Extremities: trace peripheral edema, warm Psych: Appropriate affect Neuro: drowsy but easily arousable, no focal deficit   Hb with appropriate response S Cr, electrolytes stable  Resolved Hospital Problem list      Assessment & Plan:   ABLA GI bleeding Had EGD 9/10 which showed minimal gastritis and submucosal lesion felt most c/w lipoma. Biopsy came back showing h pylori. - maintain 2 good PIVs - protonix IV BID - trend CBC - will likely need h pylori tx - GI consult  Acute on chronic hypoxemic respiratory failure: - reassess volume status with POCUS today, careful diuresis given history of HOCM - repeat CXR after more diuresis, if failing to see improved aeration will repeat CT Chest. Unsure if he's having recurrent aspiration/post-obstructive PNA (with unclear etiology of underlying obstructive lesion), volume overload or organizing pneumonia. There was no BAL cell count/diff on bronch 9/12. - NIV at night/naps and prn, can trial salter if more comfortable today  Acute toxic metabolic encephalopathy: Appears to have cleared on my evaluation this morning. - ctm  CKD4 - follow  electrolytes  HOCM Diastolic dysfunction - gentle diuresis as above - prn labetalol for now - reintroduce home antihypertensives if stable, bleeding subsides later today  OSA - NIV at night and with naps  DM2  - ssi    Best practice:  Diet: NPO Pain/Anxiety/Delirium protocol (if indicated): N/A VAP protocol (if indicated): YES DVT prophylaxis: SCDs GI prophylaxis: Protonix Glucose control: sliding scale insulin Mobility/Activity: bedrest   Code Status: Full Code Family Communication:   no family at bedside Disposition: admit to ICU  This patient is critically ill with multiple organ system failure; which, requires frequent high complexity decision making, assessment, support, evaluation, and titration of therapies. This was completed through the application of advanced monitoring technologies and extensive interpretation of multiple databases. During this encounter critical care time was devoted to patient care services described in this note for 40 minutes.

## 2021-03-17 NOTE — Consult Note (Signed)
Consultation  Referring Provider:     Leslye Peer, MD Primary Care Physician:  [name], MD Primary Gastroenterologist:        Lyndel Safe during recent hospitalization Reason for Consultation:     Hematochezia     Impression / Plan:   1) Rectal bleeding/Hematochezia from at least an anal fissure ? Other process 2) Recent H pylori gastritis - not yet treated 3) Decreased Hgb - GI bleeding, other cause? 4) Acute on chronic respiratory failure on BiPAP not a candidate to desate at this time   Monitor hemoglobin and hematocrit and treat anal fissure with diltiazem gel 2% 4 times daily  He says he has constipation at baseline will need a bowel regimen with MiraLAX daily also  We will follow him.  Regarding H. pylori I would wait to treat and this is the regimen I recommend  1) Omeprazole or other acceptable PPI 20 mg 2 times a day x 14 d 2) Pepto Bismol 2 tabs (262 mg each) 4 times a day x 14 d 3) Metronidazole 250 mg 4 times a day x 14 d 4) doxycycline 100 mg 2 times a day x 14 d or tetracycline 500 mg 4 times daily  I can start this at an appropriate time.  There is thought to be significant Biaxin resistance so we try not to use regimens with Biaxin unless it is an only option.  So would discontinue Biaxin and metronidazole.  I think the rectal bleeding and hematochezia is from the fissure but as I stated above its not entirely clear.  His hemoglobin did go down I do not think this is an upper GI bleed however given hemodynamics.  Eventually he will need a lower endoscopy preferably a complete colonoscopy, I think as opposed to sigmoidoscopy.  Clinical course will determine that.   Gatha Mayer, MD, [facility] 03/17/2021 12:13 PM      HPI:   Robert Chaney is a 70 y.o. male admitted with acute on chronic respiratory failure and noted to be incontinent of bloody stool.  Recent history is notable for an EGD with mild gastritis that was H. pylori  positive on September 10 the.  The H. pylori is not yet treated.  He had a hemoglobin of 7.9 when discharged on September 14 then 7.6 on admission 7.16.1 and now 7.2.  He tells me he has chronic constipation and straining and anorectal pain with defecation.  He had been seeing bright red blood per rectum as well off and on I think chronically.  He is tolerating BiPAP and improving from a respiratory status.  He denies any abdominal pain.  He is not using any NSAIDs and he was taking his medications as prescribed on discharge he tells me.  I do not think he has had a colonoscopy previously though not ask specifically there is none in the records or previous notes. Past Medical History:  Diagnosis Date   Altered mental status    a. 05/2017 - adm with blurred vision, somnolence in setting of AKI and high blood sugar.   Anemia    CKD (chronic kidney disease), stage III (HCC)    Diabetes mellitus    Diastolic CHF, chronic ([place])    A.  03/2009 Echo: EF 60-65%, Gr II diast dysfxn   Elevated troponin    a. 2013 - troponin 1.4. b. 2019 - troponin 0.32; neg nuc 07/2017.   Glaucoma    Hyperlipidemia    HYPERCHOLESTEROLEMIA  Hypertension    MARKED LEFT VENTRICULAR HYPERTROPHY BY PREVIOUS ECHOCARDIOGRAM--HE HAS HYPERDYNAMIC LEFT VENTRICULAR SYSTOLIC FUNCTION AND HAS IMPAIRED RELAXATION BY ECHO   Hypertrophic cardiomyopathy ([place])    NSVT (nonsustained ventricular tachycardia) (HCC)    Premature atrial contractions    PVC's (premature ventricular contractions)    SVT (supraventricular tachycardia) ([place])     Past Surgical History:  Procedure Laterality Date   BIOPSY  03/03/2021   Procedure: BIOPSY;  Surgeon: Jackquline Denmark, MD;  Location: [place];  Service: Endoscopy;;   BRONCHIAL BIOPSY  03/05/2021   Procedure: BRONCHIAL BIOPSIES;  Surgeon: Candee Furbish, MD;  Location: [facility] ENDOSCOPY;  Service: Pulmonary;;   BRONCHIAL BRUSHINGS  03/05/2021   Procedure: BRONCHIAL BRUSHINGS;  Surgeon: Candee Furbish, MD;  Location[facility]COPY;  Service: Pulmonary;;   BRONCHIAL WASHINGS  03/05/2021   Procedure: BRONCHIAL WASHINGS;  Surgeon: Candee Furbish, MD;  Location: [facility] ENDOSCOPY;  Service: Pulmonary;;   ESOPHAGOGASTRODUODENOSCOPY (EGD) WITH PROPOFOL N/A 03/03/2021   Procedure: ESOPHAGOGASTRODUODENOSCOPY (EGD) WITH PROPOFOL;  Surgeon: Jackquline Denmark, MD;  Location: [facility] ENDOSCOPY;  Service: Endoscopy;  Laterality: N/A;   HEMOSTASIS CONTROL  03/05/2021   Procedure: HEMOSTASIS CONTROL;  Surgeon: Candee Furbish, MD;  Location: [facility] ENDOSCOPY;  Service: Pulmonary;;   NO PAST SURGERIES     VIDEO BRONCHOSCOPY N/A 03/05/2021   Procedure: VIDEO BRONCHOSCOPY WITH FLUORO;  Surgeon: Candee Furbish, MD;  Location: [facility] ENDOSCOPY;  Service: Pulmonary;  Laterality: N/A;    Family History  Problem Relation Age of Onset   Hypertension Father    Stroke Father    Hypertension Mother    Diabetes Brother    Hypertension Brother    Diabetes Brother     Social History   Tobacco Use   Smoking status: Never   Smokeless tobacco: Never  Vaping Use   Vaping Use: Never used  Substance Use Topics   Alcohol use: No   Drug use: No    Prior to Admission medications   Medication Sig Start Date End Date Taking? Authorizing Provider  amLODipine (NORVASC) 10 MG tablet Take 1 tablet (10 mg total) by mouth daily. 05/13/20  Yes Shawna Clamp, MD  aspirin EC 81 MG tablet Take 1 tablet (81 mg total) by mouth daily. Swallow whole. 03/02/20  Yes Bhagat, Bhavinkumar, PA  atorvastatin (LIPITOR) 20 MG tablet Take 1 tablet (20 mg total) by mouth daily. 03/16/18  Yes Dorothy Spark, MD  carvedilol (COREG) 12.5 MG tablet Take 1 tablet (12.5 mg total) by mouth 2 (two) times daily. 03/10/21 04/09/21 Yes Thurnell Lose, MD  docusate sodium (COLACE) 100 MG capsule Take 2 capsules (200 mg total) by mouth 2 (two) times daily. 03/10/21  Yes Thurnell Lose, MD  furosemide (LASIX) 40 MG tablet Take 1 tablet (40 mg total) by mouth 2 (two)  times daily. Patient taking differently: Take 40 mg by mouth daily. 05/13/20  Yes Shawna Clamp, MD  hydrALAZINE (APRESOLINE) 100 MG tablet Take 1 tablet (100 mg total) by mouth 3 (three) times daily. 03/16/20  Yes Skeet Latch, MD  nitroGLYCERIN (NITROSTAT) 0.4 MG SL tablet DISSOLVE ONE TABLET UNDER THE TONGUE EVERY 5 MINUTES AS NEEDED FOR CHEST PAIN. Patient taking differently: Place 0.4 mg under the tongue every 5 (five) minutes as needed for chest pain. 03/27/20  Yes Dorothy Spark, MD  NOVOLOG RELION 100 UNIT/ML injection Inject 6 Units into the skin with breakfast, with lunch, and with evening meal. 12/19/20  Yes [provider]  pantoprazole (PROTONIX) 40 MG tablet Take 1 tablet (40 mg total) by mouth 2 (two) times daily before a meal. 03/10/21  Yes Thurnell Lose, MD  brimonidine (ALPHAGAN) 0.2 % ophthalmic solution Place 1 drop into the right eye 2 (two) times daily. 10/06/19   [provider]  cholecalciferol (VITAMIN D3) 25 MCG (1000 UT) tablet Take 1,000 Units by mouth daily.    [provider]  Continuous Blood Gluc Sensor (DEXCOM G6 SENSOR) MISC 1 Device by Does not apply route as directed. 01/12/21   Shamleffer, Melanie Crazier, MD  Continuous Blood Gluc Transmit (DEXCOM G6 TRANSMITTER) MISC 1 Device by Does not apply route as directed. 01/12/21   Shamleffer, Melanie Crazier, MD  diclofenac Sodium (VOLTAREN) 1 % GEL Apply 4 g topically 4 (four) times daily as needed for pain. 02/07/20   [provider]  dorzolamide (TRUSOPT) 2 % ophthalmic solution Place 1 drop into both eyes 2 (two) times daily. 12/04/20   [provider]  doxazosin (CARDURA) 2 MG tablet Take 2 mg by mouth daily. Patient not sure if still taking    [provider]  glucose blood (ONETOUCH VERIO) test strip USE 1 STRIP TO CHECK GLUCOSE THREE TIMES DAILY AS DIRECTED 07/24/20   Shamleffer, Melanie Crazier, MD  Insulin Glargine 300 UNIT/ML SOPN Inject 20 Units into  the skin at bedtime.    [provider]  isosorbide mononitrate (IMDUR) 60 MG 24 hr tablet Take 60 mg by mouth daily. 04/27/20   [provider]  latanoprost (XALATAN) 0.005 % ophthalmic solution Place 1 drop into the left eye at bedtime. 10/06/19   [provider]  polyethylene glycol (MIRALAX) 17 g packet Take 17 g by mouth daily. 03/10/21   Thurnell Lose, MD    Current Facility-Administered Medications  Medication Dose Route Frequency Provider Last Rate Last Admin   albuterol (PROVENTIL) (2.5 MG/3ML) 0.083% nebulizer solution 2.5 mg  2.5 mg Nebulization Q4H PRN Andres Labrum D, PA-C       ceFEPIme (MAXIPIME) 2 g in sodium chloride 0.9 % 100 mL IVPB  2 g Intravenous Q24H Ralph Dowdy, [facility]   Stopped at 03/16/21 2251   Chlorhexidine Gluconate Cloth 2 % PADS 6 each  6 each Topical Daily Renee Pain, MD   6 each at 03/17/21 0910   clarithromycin (BIAXIN) tablet 250 mg  250 mg Per Tube Q12H Maryjane Hurter, MD       docusate sodium (COLACE) capsule 100 mg  100 mg Oral BID PRN Andres Labrum D, PA-C       insulin aspart (novoLOG) injection 0-9 Units  0-9 Units Subcutaneous Q4H Renee Pain, MD   1 Units at 03/17/21 0740   labetalol (NORMODYNE) injection 10 mg  10 mg Intravenous Q2H PRN Anders Simmonds, MD   10 mg at 03/17/21 0753   metroNIDAZOLE (FLAGYL) 50 mg/ml oral suspension 500 mg  500 mg Per Tube TID Maryjane Hurter, MD       pantoprazole (PROTONIX) injection 40 mg  40 mg Intravenous Q12H Renee Pain, MD   40 mg at 03/17/21 0908   polyethylene glycol (MIRALAX / GLYCOLAX) packet 17 g  17 g Oral Daily PRN Mick Sell, PA-C        Allergies as of 03/16/2021 - Review Complete 03/16/2021  Allergen Reaction Noted   Aspirin Itching 02/20/2011     Review of Systems:     All other review of systems are negative except as per HPI.  Physical Exam:  Vital signs in last 24 hours: Temp:  [97 F (36.1 C)-97.7 F (36.5 C)] 97.7 F (36.5 C)  (09/24 0807) Pulse Rate:  [57-76] 62 (09/24 0900) Resp:  [13-28] 15 (09/24 0900) BP: (96-190)/(48-116) 157/80 (09/24 0900) SpO2:  [92 %-100 %] 98 % (09/24 0900) FiO2 (%):  [40 %-100 %] 40 % (09/24 0752) Weight:  [75.6 kg-75.9 kg] 75.9 kg (09/24 0500) Last BM Date: 03/16/21  General:  Critically ill BM w/ BiPAP mask covering face Lungs: Coarse anterior BS bilaterally. Heart:   Distant S1S2, no rubs, murmurs, gallops. Abdomen:  soft, non-tender, no hepatosplenomegaly, hernia, or mass and BS+.  Rectal: Tender posterior fissure, anal stenosis, no mass, BRB on gloved finger . Extremities:   no edema  Neuro:  A&O x 3.  Psych:  appropriate mood and  Affect.   Data Reviewed:   LAB RESULTS: Recent Labs    03/16/21 1953 03/16/21 2035 03/16/21 2154 03/17/21 0400 03/17/21 0405  WBC 21.2*  --  10.6* 15.0*  --   HGB 7.6*   < > 6.1* 7.2* 7.8*  HCT 26.9*   < > 21.7* 24.8* 23.0*  PLT 312  --  189 202  --    < > = values in this interval not displayed.   BMET Recent Labs    03/16/21 1953 03/16/21 2035 03/16/21 2154 03/17/21 0400 03/17/21 0405  NA 139 140  --  139 142  K 5.7* 5.5*  --  5.0 5.0  CL 108  --   --  106  --   CO2 25  --   --  26  --   GLUCOSE 181*  --   --  153*  --   BUN 49*  --   --  51*  --   CREATININE 2.64*  --  2.70* 2.67*  --   CALCIUM 8.6*  --   --  8.7*  --    LFT Recent Labs    03/16/21 1953  PROT 7.0  ALBUMIN 2.9*  AST 27  ALT 20  ALKPHOS 75  BILITOT 0.8     STUDIES: DG Chest Port 1 View  Result Date: 03/17/2021 CLINICAL DATA:  Hypoxic respiratory failure. EXAM: PORTABLE CHEST 1 VIEW COMPARISON:  03/16/2021 FINDINGS: Normal scratch set stable cardiomediastinal contours. No pleural effusion identified. There is diffuse pulmonary vascular congestion. Dense airspace consolidation within the right upper lobe is unchanged. Mild patchy airspace densities are noted within the right lower lobe, left midlung and left base, also unchanged. IMPRESSION:  1. No change in aeration to the lungs compared with prior exam. 2. Stable pulmonary vascular congestion. Electronically Signed   By: Kerby Moors M.D.   On: 03/17/2021 08:10   DG Chest Port 1 View  Result Date: 03/16/2021 CLINICAL DATA:  Dyspnea. EXAM: PORTABLE CHEST 1 VIEW COMPARISON:  March 05, 2021 FINDINGS: Marked severity infiltrate is seen within the mid right lung with moderate severity infiltrate noted along the right lung base. This represents a new finding when compared to the prior study. There is no evidence of a pleural effusion or pneumothorax. The heart size and mediastinal contours are within normal limits. Marked severity calcification of the thoracic aorta is seen. Multilevel degenerative changes are noted throughout the thoracic spine. IMPRESSION: Moderate to marked severity infiltrates within the mid right lung and right lung base. Electronically Signed   By: Virgina Norfolk M.D.   On: 03/16/2021 20:57        Thanks  LOS: 1 day   @Bryor Rami  Simonne Maffucci, MD, [facility] @  03/17/2021, 11:26 AM

## 2021-03-17 NOTE — Evaluation (Signed)
Clinical/Bedside Swallow Evaluation Patient Details  Name: Robert Chaney MRN: [id number] Date of Birth: 1950/12/19  Today's Date: 03/17/2021 Time: SLP Start Time (ACUTE ONLY): 0258 SLP Stop Time (ACUTE ONLY): 5277 SLP Time Calculation (min) (ACUTE ONLY): 15 min  Past Medical History:  Past Medical History:  Diagnosis Date   Altered mental status    a. 05/2017 - adm with blurred vision, somnolence in setting of AKI and high blood sugar.   Anemia    CKD (chronic kidney disease), stage III (HCC)    Diabetes mellitus    Diastolic CHF, chronic ([place])    A.  03/2009 Echo: EF 60-65%, Gr II diast dysfxn   Elevated troponin    a. 2013 - troponin 1.4. b. 2019 - troponin 0.32; neg nuc 07/2017.   Glaucoma    Hyperlipidemia    HYPERCHOLESTEROLEMIA   Hypertension    MARKED LEFT VENTRICULAR HYPERTROPHY BY PREVIOUS ECHOCARDIOGRAM--HE HAS HYPERDYNAMIC LEFT VENTRICULAR SYSTOLIC FUNCTION AND HAS IMPAIRED RELAXATION BY ECHO   Hypertrophic cardiomyopathy (HCC)    NSVT (nonsustained ventricular tachycardia) (HCC)    Premature atrial contractions    PVC's (premature ventricular contractions)    SVT (supraventricular tachycardia) ([place])    Past Surgical History:  Past Surgical History:  Procedure Laterality Date   BIOPSY  03/03/2021   Procedure: BIOPSY;  Surgeon: Jackquline Denmark, MD;  Location: [place];  Service: Endoscopy;;   BRONCHIAL BIOPSY  03/05/2021   Procedure: BRONCHIAL BIOPSIES;  Surgeon: Candee Furbish, MD;  Location[facility] ENDOSCOPY;  Service: Pulmonary;;   BRONCHIAL BRUSHINGS  03/05/2021   Procedure: BRONCHIAL BRUSHINGS;  Surgeon: Candee Furbish, MD;  Location: [facility] ENDOSCOPY;  Service: Pulmonary;;   BRONCHIAL WASHINGS  03/05/2021   Procedure: BRONCHIAL WASHINGS;  Surgeon: Candee Furbish, MD;  Location: [facility] ENDOSCOPY;  Service: Pulmonary;;   ESOPHAGOGASTRODUODENOSCOPY (EGD) WITH PROPOFOL N/A 03/03/2021   Procedure: ESOPHAGOGASTRODUODENOSCOPY (EGD) WITH PROPOFOL;  Surgeon: Jackquline Denmark, MD;   Location: [facility] ENDOSCOPY;  Service: Endoscopy;  Laterality: N/A;   HEMOSTASIS CONTROL  03/05/2021   Procedure: HEMOSTASIS CONTROL;  Surgeon: Candee Furbish, MD;  Location: [facility] ENDOSCOPY;  Service: Pulmonary;;   NO PAST SURGERIES     VIDEO BRONCHOSCOPY N/A 03/05/2021   Procedure: VIDEO BRONCHOSCOPY WITH FLUORO;  Surgeon: Candee Furbish, MD;  Location: [facility] ENDOSCOPY;  Service: Pulmonary;  Laterality: N/A;   HPI:  This 70 y.o. African American male nonsmoker presented to the [facility] Emergency Department via EMS with complaints of respiratory distress.  Per EMS, the patient was found to be hypoxic.  SpO2 73% despite non-rebreather mask. He was reportedly placed on CPAP and subsequently became unresponsive.  He was supported with bag-mask ventilation and transported to the ER.  In the ER, the patient has been placed on BiPAP.Of note, the patient was recently hospitalized for pneumonia and was discharged on 9/17 and was treated with cefepime/vancomycin which was switched to Unasyn.  He underwent bronch/BAL/TBBx/brushings 03/05/21. All unrevealing but no BAL cell count/diff obtained. MD concerned about potential aspiration as patient has had recurrent PNA's recently.    Assessment / Plan / Recommendation  Clinical Impression  Patient did not present with any overt s/s aspiration or penetration, did not report any dysphagia and RN reporting no observed difficulty with swallowing. As patient has been having recurrent PNA's  plan is to proceed with MBS to r/o silent aspiration as cause. Patient in agreement with this plan and appreciative for input as he reports he has been unable to  determine what is causing his PNA's. SLP Visit Diagnosis: Dysphagia, unspecified (R13.10)    Aspiration Risk  Mild aspiration risk    Diet Recommendation Regular;Thin liquid   Liquid Administration via: Cup;Straw Medication Administration: Whole meds with liquid Supervision: Patient able to self  feed Compensations: Slow rate;Small sips/bites Postural Changes: Seated upright at 90 degrees    Other  Recommendations Oral Care Recommendations: Oral care BID;Patient independent with oral care    Recommendations for follow up therapy are one component of a multi-disciplinary discharge planning process, led by the attending physician.  Recommendations may be updated based on patient status, additional functional criteria and insurance authorization.  Follow up Recommendations None      Frequency and Duration min 1 x/week  1 week       Prognosis Prognosis for Safe Diet Advancement: Good      Swallow Study   General Date of Onset: 03/16/21 HPI: This 70 y.o. African American male nonsmoker presented to the [facility] Emergency Department via EMS with complaints of respiratory distress.  Per EMS, the patient was found to be hypoxic.  SpO2 73% despite non-rebreather mask. He was reportedly placed on CPAP and subsequently became unresponsive.  He was supported with bag-mask ventilation and transported to the ER.  In the ER, the patient has been placed on BiPAP.Of note, the patient was recently hospitalized for pneumonia and was discharged on 9/17 and was treated with cefepime/vancomycin which was switched to Unasyn.  He underwent bronch/BAL/TBBx/brushings 03/05/21. All unrevealing but no BAL cell count/diff obtained. MD concerned about potential aspiration as patient has had recurrent PNA's recently. Type of Study: Bedside Swallow Evaluation Previous Swallow Assessment: None found Diet Prior to this Study: Regular;Thin liquids Temperature Spikes Noted: No Respiratory Status: Nasal cannula History of Recent Intubation: No Behavior/Cognition: Alert;Cooperative;Pleasant mood Oral Cavity Assessment: Within Functional Limits Oral Care Completed by SLP: No Oral Cavity - Dentition: Adequate natural dentition Vision: Functional for self-feeding Self-Feeding Abilities: Able  to feed self Patient Positioning: Upright in bed Baseline Vocal Quality: Normal Volitional Cough: Strong Volitional Swallow: Able to elicit    Oral/Motor/Sensory Function Overall Oral Motor/Sensory Function: Within functional limits   Ice Chips     Thin Liquid Thin Liquid: Within functional limits Presentation: Self Fed;Straw    Nectar Thick     Honey Thick     Puree     Solid            Sonia Baller, MA, CCC-SLP Speech Therapy [facility] Acute Rehab

## 2021-03-17 NOTE — Progress Notes (Signed)
[place] Progress Note Patient Name: Robert Chaney DOB: [birth year] MRN: [id number]   Date of Service  03/17/2021  HPI/Events of Note  Hypertension - BP = 182/82. Cardiac echo from 01/2021 demonstrates Hypertrophic Obstructive Cardiomyopathy.   eICU Interventions  Plan: Labetalol 10 mg IV Q 2 hours PRN SBP > 170 or DBP > 100.     Intervention Category Major Interventions: Hypertension - evaluation and management  Robert Chaney 03/17/2021, 4:40 AM

## 2021-03-17 NOTE — Progress Notes (Addendum)
[place] Progress Note Patient Name: Robert Chaney DOB: April 15, 1951 MRN: [id number]   Date of Service  03/17/2021  HPI/Events of Note  Needs CBG's changed from q 4 to AC/HS with insulin coverage, has non productive cough, needs cough medicine  eICU Interventions  Ordered cough meds and ssi changes.      Intervention Category Intermediate Interventions: Hyperglycemia - evaluation and treatment  Elmer Sow 03/17/2021, 7:44 PM  5:36 Flexi seal ordered as per RN request.

## 2021-03-18 DIAGNOSIS — K601 Chronic anal fissure: Secondary | ICD-10-CM | POA: Diagnosis not present

## 2021-03-18 DIAGNOSIS — J9622 Acute and chronic respiratory failure with hypercapnia: Secondary | ICD-10-CM | POA: Diagnosis not present

## 2021-03-18 DIAGNOSIS — K921 Melena: Secondary | ICD-10-CM | POA: Diagnosis not present

## 2021-03-18 DIAGNOSIS — B9681 Helicobacter pylori [H. pylori] as the cause of diseases classified elsewhere: Secondary | ICD-10-CM | POA: Diagnosis not present

## 2021-03-18 DIAGNOSIS — J9621 Acute and chronic respiratory failure with hypoxia: Secondary | ICD-10-CM | POA: Diagnosis not present

## 2021-03-18 DIAGNOSIS — K2970 Gastritis, unspecified, without bleeding: Secondary | ICD-10-CM | POA: Diagnosis not present

## 2021-03-18 LAB — TYPE AND SCREEN
ABO/RH(D): O POS
Antibody Screen: NEGATIVE
Unit division: 0

## 2021-03-18 LAB — BASIC METABOLIC PANEL
Anion gap: 8 (ref 5–15)
BUN: 58 mg/dL — ABNORMAL HIGH (ref 8–23)
CO2: 27 mmol/L (ref 22–32)
Calcium: 8.6 mg/dL — ABNORMAL LOW (ref 8.9–10.3)
Chloride: 103 mmol/L (ref 98–111)
Creatinine, Ser: 2.89 mg/dL — ABNORMAL HIGH (ref 0.61–1.24)
GFR, Estimated: 22 mL/min — ABNORMAL LOW (ref >60)
Glucose, Bld: 117 mg/dL — ABNORMAL HIGH (ref 70–99)
Potassium: 5.2 mmol/L — ABNORMAL HIGH (ref 3.5–5.1)
Sodium: 138 mmol/L (ref 135–145)

## 2021-03-18 LAB — CBC WITH DIFFERENTIAL/PLATELET
Abs Immature Granulocytes: 0.07 10*3/uL (ref 0.00–0.07)
Basophils Absolute: 0 10*3/uL (ref 0.0–0.1)
Basophils Relative: 0 %
Eosinophils Absolute: 0 10*3/uL (ref 0.0–0.5)
Eosinophils Relative: 0 %
HCT: 24.8 % — ABNORMAL LOW (ref 39.0–52.0)
Hemoglobin: 7.7 g/dL — ABNORMAL LOW (ref 13.0–17.0)
Immature Granulocytes: 1 %
Lymphocytes Relative: 9 %
Lymphs Abs: 1 10*3/uL (ref 0.7–4.0)
MCH: 29.1 pg (ref 26.0–34.0)
MCHC: 31 g/dL (ref 30.0–36.0)
MCV: 93.6 fL (ref 80.0–100.0)
Monocytes Absolute: 0.4 10*3/uL (ref 0.1–1.0)
Monocytes Relative: 4 %
Neutro Abs: 9.2 10*3/uL — ABNORMAL HIGH (ref 1.7–7.7)
Neutrophils Relative %: 86 %
Platelets: 177 10*3/uL (ref 150–400)
RBC: 2.65 MIL/uL — ABNORMAL LOW (ref 4.22–5.81)
RDW: 16.4 % — ABNORMAL HIGH (ref 11.5–15.5)
WBC: 10.6 10*3/uL — ABNORMAL HIGH (ref 4.0–10.5)
nRBC: 0 % (ref 0.0–0.2)

## 2021-03-18 LAB — GLUCOSE, CAPILLARY
Glucose-Capillary: 109 mg/dL — ABNORMAL HIGH (ref 70–99)
Glucose-Capillary: 169 mg/dL — ABNORMAL HIGH (ref 70–99)
Glucose-Capillary: 121 mg/dL — ABNORMAL HIGH (ref 70–99)
Glucose-Capillary: 167 mg/dL — ABNORMAL HIGH (ref 70–99)

## 2021-03-18 LAB — BPAM RBC
Blood Product Expiration Date: 202210172359
ISSUE DATE / TIME: 202209240335
Unit Type and Rh: 5100

## 2021-03-18 LAB — POTASSIUM: Potassium: 5.6 mmol/L — ABNORMAL HIGH (ref 3.5–5.1)

## 2021-03-18 MED ORDER — HYDRALAZINE HCL 50 MG PO TABS
50.0000 mg | ORAL_TABLET | Freq: Three times a day (TID) | ORAL | Status: DC
  Administered 2021-03-18 – 2021-03-19 (×4): 50 mg via ORAL
  Filled 2021-03-18 (×4): qty 1

## 2021-03-18 MED ORDER — PANTOPRAZOLE SODIUM 40 MG PO TBEC
40.0000 mg | DELAYED_RELEASE_TABLET | Freq: Two times a day (BID) | ORAL | Status: DC
Start: 2021-03-18 — End: 2021-03-22
  Administered 2021-03-18 – 2021-03-22 (×8): 40 mg via ORAL
  Filled 2021-03-18 (×8): qty 1

## 2021-03-18 MED ORDER — FUROSEMIDE 10 MG/ML IJ SOLN
40.0000 mg | Freq: Once | INTRAMUSCULAR | Status: AC
  Administered 2021-03-18: 40 mg via INTRAVENOUS
  Filled 2021-03-18: qty 4

## 2021-03-18 MED ORDER — AMLODIPINE BESYLATE 10 MG PO TABS
10.0000 mg | ORAL_TABLET | Freq: Every day | ORAL | Status: DC
  Administered 2021-03-18 – 2021-03-22 (×5): 10 mg via ORAL
  Filled 2021-03-18 (×5): qty 1

## 2021-03-18 NOTE — Progress Notes (Signed)
NAME:  Robert Chaney MRN:  [id number] DOB:  Mar 04, 1951 LOS: 2 ADMISSION DATE:  03/16/2021 DATE OF SERVICE:  03/16/2021  CHIEF COMPLAINT:  dyspnea, altered mental status   HISTORY & PHYSICAL  History of Present Illness  This 70 y.o. African American male nonsmoker presented to the [facility] Emergency Department via EMS with complaints of respiratory distress.  Per EMS, the patient was found to be hypoxic.  SpO2 73% despite non-rebreather mask. He was reportedly placed on CPAP and subsequently became unresponsive.  He was supported with bag-mask ventilation and transported to the ER.  He was given DuoNeb and SoluMedrol 125 mg IV single dose.  Of note, he was hypertensive (260/110) and tachycardic (100).  In the ER, the patient has been placed on BiPAP.  He was also noted to be incontinent of bloody stool.  PCCM asked to admit.  Of note, the patient was recently hospitalized for pneumonia and was discharged on 9/17 and was treated with cefepime/vancomycin which was switched to Unasyn.  He underwent bronch/BAL/TBBx/brushings 03/05/21. All unrevealing but no BAL cell count/diff obtained. He is also noted to have HFpER/hypertensive heart disease with diastolic dysfunction.  At the time of clinical interview, the patient is on BiPAP, FiO2 50%.  SpO2 100%.  He is arousable to loud voice and noxious stimuli but does not stay awake for clinical interview.  REVIEW OF SYSTEMS This patient is critically ill and cannot provide additional history nor review of systems due to mental status/unconsciousness.   Past Medical/Surgical/Social/Family History   Past Medical History:  Diagnosis Date   Altered mental status    a. 05/2017 - adm with blurred vision, somnolence in setting of AKI and high blood sugar.   Anemia    CKD (chronic kidney disease), stage III (HCC)    Diabetes mellitus    Diastolic CHF, chronic ([place])    A.  03/2009 Echo: EF 60-65%, Gr II diast dysfxn   Elevated troponin     a. 2013 - troponin 1.4. b. 2019 - troponin 0.32; neg nuc 07/2017.   Glaucoma    Hyperlipidemia    HYPERCHOLESTEROLEMIA   Hypertension    MARKED LEFT VENTRICULAR HYPERTROPHY BY PREVIOUS ECHOCARDIOGRAM--HE HAS HYPERDYNAMIC LEFT VENTRICULAR SYSTOLIC FUNCTION AND HAS IMPAIRED RELAXATION BY ECHO   Hypertrophic cardiomyopathy (HCC)    NSVT (nonsustained ventricular tachycardia) (HCC)    Premature atrial contractions    PVC's (premature ventricular contractions)    SVT (supraventricular tachycardia) ([place])     Past Surgical History:  Procedure Laterality Date   BIOPSY  03/03/2021   Procedure: BIOPSY;  Surgeon: Jackquline Denmark, MD;  Location: [place];  Service: Endoscopy;;   BRONCHIAL BIOPSY  03/05/2021   Procedure: BRONCHIAL BIOPSIES;  Surgeon: Candee Furbish, MD;  Location: [facility] ENDOSCOPY;  Service: Pulmonary;;   BRONCHIAL BRUSHINGS  03/05/2021   Procedure: BRONCHIAL BRUSHINGS;  Surgeon: Candee Furbish, MD;  Location[facility] END[facility]COPY;  Service: Pulmonary;;   BRONCHIAL WASHINGS  03/05/2021   Procedure: BRONCHIAL WASHINGS;  Surgeon: Candee Furbish, MD;  Location: [facility] ENDOSCOPY;  Service: Pulmonary;;   ESOPHAGOGASTRODUODENOSCOPY (EGD) WITH PROPOFOL N/A 03/03/2021   Procedure: ESOPHAGOGASTRODUODENOSCOPY (EGD) WITH PROPOFOL;  Surgeon: Jackquline Denmark, MD;  Location: [facility] ENDOSCOPY;  Service: Endoscopy;  Laterality: N/A;   HEMOSTASIS CONTROL  03/05/2021   Procedure: HEMOSTASIS CONTROL;  Surgeon: Candee Furbish, MD;  Location: [facility] ENDOSCOPY;  Service: Pulmonary;;   NO PAST SURGERIES     VIDEO BRONCHOSCOPY N/A 03/05/2021   Procedure: VIDEO  BRONCHOSCOPY WITH FLUORO;  Surgeon: Candee Furbish, MD;  Location: [facility] ENDOSCOPY;  Service: Pulmonary;  Laterality: N/A;    Social History   Tobacco Use   Smoking status: Never   Smokeless tobacco: Never  Substance Use Topics   Alcohol use: No    Family History  Problem Relation Age of Onset   Hypertension Father    Stroke Father    Hypertension Mother     Diabetes Brother    Hypertension Brother    Diabetes Brother      Procedures:  9/10 EGD with mild gastritis  9/12 bronch/BAL/TBBx/brushings all unrevealing. No BAL cell count/diff.   Significant Diagnostic Tests:     Micro Data:   Results for orders placed or performed during the hospital encounter of 03/16/21  Resp Panel by RT-PCR (Flu A&B, Covid) Nasopharyngeal Swab     Status: None   Collection Time: 03/16/21  7:54 PM   Specimen: Nasopharyngeal Swab; Nasopharyngeal(NP) swabs in vial transport medium  Result Value Ref Range Status   SARS Coronavirus 2 by RT PCR NEGATIVE NEGATIVE Final    Comment: (NOTE) SARS-CoV-2 target nucleic acids are NOT DETECTED.  The SARS-CoV-2 RNA is generally detectable in upper respiratory specimens during the acute phase of infection. The lowest concentration of SARS-CoV-2 viral copies this assay can detect is 138 copies/mL. A negative result does not preclude SARS-Cov-2 infection and should not be used as the sole basis for treatment or other patient management decisions. A negative result may occur with  improper specimen collection/handling, submission of specimen other than nasopharyngeal swab, presence of viral mutation(s) within the areas targeted by this assay, and inadequate number of viral copies(<138 copies/mL). A negative result must be combined with clinical observations, patient history, and epidemiological information. The expected result is Negative.  Fact Sheet for Patients:  EntrepreneurPulse.com.au  Fact Sheet for Healthcare Providers:  IncredibleEmployment.be  This test is no t yet approved or cleared by the [place] FDA and  has been authorized for detection and/or diagnosis of SARS-CoV-2 by FDA under an Emergency Use Authorization (EUA). This EUA will remain  in effect (meaning this test can be used) for the duration of the COVID-19 declaration under Section 564(b)(1) of the  Act, 21 U.S.C.section 360bbb-3(b)(1), unless the authorization is terminated  or revoked sooner.       Influenza A by PCR NEGATIVE NEGATIVE Final   Influenza B by PCR NEGATIVE NEGATIVE Final    Comment: (NOTE) The Xpert Xpress SARS-CoV-2/FLU/RSV plus assay is intended as an aid in the diagnosis of influenza from Nasopharyngeal swab specimens and should not be used as a sole basis for treatment. Nasal washings and aspirates are unacceptable for Xpert Xpress SARS-CoV-2/FLU/RSV testing.  Fact Sheet for Patients: EntrepreneurPulse.com.au  Fact Sheet for Healthcare Providers: IncredibleEmployment.be  This test is not yet approved or cleared by the [place] FDA and has been authorized for detection and/or diagnosis of SARS-CoV-2 by FDA under an Emergency Use Authorization (EUA). This EUA will remain in effect (meaning this test can be used) for the duration of the COVID-19 declaration under Section 564(b)(1) of the Act, 21 U.S.C. section 360bbb-3(b)(1), unless the authorization is terminated or revoked.  Performed at [facility], [place] [address] [place], [place] [address]   MRSA Next Gen by PCR, Nasal     Status: None   Collection Time: 03/17/21 12:00 AM  Result Value Ref Range Status   MRSA by PCR Next Gen NOT DETECTED NOT DETECTED Final  Comment: (NOTE) The GeneXpert MRSA Assay (FDA approved for NASAL specimens only), is one component of a comprehensive MRSA colonization surveillance program. It is not intended to diagnose MRSA infection nor to guide or monitor treatment for MRSA infections. Test performance is not FDA approved in patients less than 78 years old. Performed at [facility], [place] [address], [place], [place] [address]       Antimicrobials:  Cefepime 9/23-   Interim history/subjective:  Sitting up in chair. Only feels dyspneic when walking around. No productive cough.   Objective   BP (!) 181/82    Pulse 76   Temp 98.4 F (36.9 C) (Oral)   Resp (!) 26   Wt 74.6 kg   SpO2 96%   BMI 25.01 kg/m     Filed Weights   03/17/21 0017 03/17/21 0500 03/18/21 0500  Weight: 75.6 kg 75.9 kg 74.6 kg    Intake/Output Summary (Last 24 hours) at 03/18/2021 1130 Last data filed at 03/18/2021 0800 Gross per 24 hour  Intake 820 ml  Output 1850 ml  Net -1030 ml    FiO2 (%):  [40 %] 40 %   Examination: General appearance: 70 y.o., male, NAD, conversant  Eyes: anicteric sclerae, tracking HENT: NCAT; dry MM Neck: Trachea midline; no lymphadenopathy Lungs:inspiratory squeaks and expiratory wheeze on left, with normal respiratory effort CV: RRR, no MRGs  Abdomen: Soft, non-tender; non-distended, BS present  Extremities: trace peripheral edema, warm Psych: Appropriate affect Neuro: Alert and oriented to person and place, no focal deficit    BUN rising S Cr up slightly K 5.6 Hb stable  Resolved Hospital Problem list      Assessment & Plan:   ABLA GI bleeding Resolving. Had EGD 9/10 which showed minimal gastritis and submucosal lesion felt most c/w lipoma. Biopsy came back showing h pylori. Here has anal fissure that was felt to likely be contributory.  - protonix 40 bid po - cbc daily - start h pylori treatment by discharge date (see treatment recommendations in GI note 9/24) - rectal dilt per GI  Acute on chronic hypoxemic respiratory failure: Recurrent aspiration/postobstructive PNA (unclear etiology of underlying obstructive lesion with no endobronchial abnormality seen 9/12) vs volume overload vs organizing pneumonia. No BAL cell count/diff from bronch 9/12. - careful diuresis lasix IV 40 today, assess response - repeat CXR after more diuresis, if failing to see improved aeration will repeat CT Chest - continue cefepime, likely 7 day course - NIV at night/naps and prn, nasal cannula during the day - MBS tomorrow - bubble study - PT/mobilization  Acute toxic metabolic  encephalopathy: Appears to have cleared on my evaluation this morning. - ctm  CKD4 - follow electrolytes  HOCM Diastolic dysfunction - gentle diuresis as above - reintroducing home antihypertensives   OSA - NIV at night and with naps  DM2  - ssi    Best practice:  Diet: NPO Pain/Anxiety/Delirium protocol (if indicated): N/A VAP protocol (if indicated): YES DVT prophylaxis: SCDs GI prophylaxis: Protonix Glucose control: sliding scale insulin Mobility/Activity: bedrest   Code Status: Full Code Family Communication:   no family at bedside Disposition: transfer to floor if stable O2 requirement today

## 2021-03-18 NOTE — Progress Notes (Signed)
Pickup from [facility] for tomorrow: patient with PMHx of chronic hypoxic respite failure, HCOM, diastolic CHF, presented with acute on chronic hypoxic respite failure secondary to fluid overload and possible HCAP.  BiPAP overnight, O2 level stable on nasal cannula, received IV Lasix today, on cefepime.  Black stool, patient was recently scoped and on PPI. GI consulted.

## 2021-03-18 NOTE — Progress Notes (Signed)
   Patient Name: Robert Chaney Date of Encounter: 03/18/2021, 11:33 AM    Subjective  Breathing much better, off BiPAP Tells me of chronic constipation, straining Bleeding less today No more transfusion - had 1 U 9/24 early AM   Objective  BP (!) 181/82   Pulse 76   Temp 98.4 F (36.9 C) (Oral)   Resp (!) 26   Wt 74.6 kg   SpO2 96%   BMI 25.01 kg/m  NAD sitting in chair  CBC Latest Ref Rng & Units 03/18/2021 03/17/2021 03/17/2021  WBC 4.0 - 10.5 K/uL 10.6(H) - -  Hemoglobin 13.0 - 17.0 g/dL 7.7(L) 8.8(L) 7.8(L)  Hematocrit 39.0 - 52.0 % 24.8(L) 27.7(L) 23.0(L)  Platelets 150 - 400 K/uL 177 - -        Assessment and Plan  Anal fissure w/ rectal bleeding Chronic constipation Recent EGD w/ H pylori gastritis - not yet Tx    Continue diltiazem gel to fissure and at DC Would treat acute issues now and treat H pylori at dc as outlined in my consult note  Would benefit from LBGI f/u and outpt colonoscopy (do not think needs done this admit unless something changes) - could see an APP and have colonoscopy w/ Dr. Lyndel Safe - primary GI MD  Will f/u in AM  Gatha Mayer, MD, [facility] 03/18/2021 11:33 AM

## 2021-03-18 NOTE — Progress Notes (Signed)
[facility] Progress Note Patient Name: EZEQUIEL MACAULEY DOB: 1951-04-17 MRN: [id number]   Date of Service  03/18/2021  HPI/Events of Note  K at 5.2, stable elevated Creatinine. GI bleed. GI notes reviewed.  eICU Interventions  - follow K level after 2 hrs. If going up to treat.         Elmer Sow 03/18/2021, 6:20 AM

## 2021-03-19 ENCOUNTER — Inpatient Hospital Stay (HOSPITAL_COMMUNITY): Payer: PPO

## 2021-03-19 DIAGNOSIS — J9621 Acute and chronic respiratory failure with hypoxia: Secondary | ICD-10-CM

## 2021-03-19 DIAGNOSIS — K284 Chronic or unspecified gastrojejunal ulcer with hemorrhage: Secondary | ICD-10-CM | POA: Diagnosis not present

## 2021-03-19 DIAGNOSIS — D62 Acute posthemorrhagic anemia: Secondary | ICD-10-CM | POA: Diagnosis not present

## 2021-03-19 DIAGNOSIS — K5909 Other constipation: Secondary | ICD-10-CM | POA: Diagnosis not present

## 2021-03-19 DIAGNOSIS — R7989 Other specified abnormal findings of blood chemistry: Secondary | ICD-10-CM | POA: Diagnosis not present

## 2021-03-19 DIAGNOSIS — N184 Chronic kidney disease, stage 4 (severe): Secondary | ICD-10-CM | POA: Diagnosis not present

## 2021-03-19 DIAGNOSIS — K602 Anal fissure, unspecified: Secondary | ICD-10-CM

## 2021-03-19 DIAGNOSIS — K601 Chronic anal fissure: Secondary | ICD-10-CM | POA: Diagnosis not present

## 2021-03-19 LAB — CBC WITH DIFFERENTIAL/PLATELET
Abs Immature Granulocytes: 0.04 10*3/uL (ref 0.00–0.07)
Basophils Absolute: 0 10*3/uL (ref 0.0–0.1)
Basophils Relative: 0 %
Eosinophils Absolute: 0 10*3/uL (ref 0.0–0.5)
Eosinophils Relative: 0 %
HCT: 27.3 % — ABNORMAL LOW (ref 39.0–52.0)
Hemoglobin: 8.3 g/dL — ABNORMAL LOW (ref 13.0–17.0)
Immature Granulocytes: 1 %
Lymphocytes Relative: 8 %
Lymphs Abs: 0.7 10*3/uL (ref 0.7–4.0)
MCH: 28.9 pg (ref 26.0–34.0)
MCHC: 30.4 g/dL (ref 30.0–36.0)
MCV: 95.1 fL (ref 80.0–100.0)
Monocytes Absolute: 0.4 10*3/uL (ref 0.1–1.0)
Monocytes Relative: 4 %
Neutro Abs: 7.7 10*3/uL (ref 1.7–7.7)
Neutrophils Relative %: 87 %
Platelets: 194 10*3/uL (ref 150–400)
RBC: 2.87 MIL/uL — ABNORMAL LOW (ref 4.22–5.81)
RDW: 16.5 % — ABNORMAL HIGH (ref 11.5–15.5)
WBC: 8.8 10*3/uL (ref 4.0–10.5)
nRBC: 0 % (ref 0.0–0.2)

## 2021-03-19 LAB — COMPREHENSIVE METABOLIC PANEL
ALT: 12 U/L (ref 0–44)
AST: 9 U/L — ABNORMAL LOW (ref 15–41)
Albumin: 2.3 g/dL — ABNORMAL LOW (ref 3.5–5.0)
Alkaline Phosphatase: 62 U/L (ref 38–126)
Anion gap: 5 (ref 5–15)
BUN: 57 mg/dL — ABNORMAL HIGH (ref 8–23)
CO2: 30 mmol/L (ref 22–32)
Calcium: 8.6 mg/dL — ABNORMAL LOW (ref 8.9–10.3)
Chloride: 103 mmol/L (ref 98–111)
Creatinine, Ser: 2.81 mg/dL — ABNORMAL HIGH (ref 0.61–1.24)
GFR, Estimated: 23 mL/min — ABNORMAL LOW (ref >60)
Glucose, Bld: 208 mg/dL — ABNORMAL HIGH (ref 70–99)
Potassium: 4.7 mmol/L (ref 3.5–5.1)
Sodium: 138 mmol/L (ref 135–145)
Total Bilirubin: 0.8 mg/dL (ref 0.3–1.2)
Total Protein: 6.9 g/dL (ref 6.5–8.1)

## 2021-03-19 LAB — GLUCOSE, CAPILLARY
Glucose-Capillary: 123 mg/dL — ABNORMAL HIGH (ref 70–99)
Glucose-Capillary: 175 mg/dL — ABNORMAL HIGH (ref 70–99)
Glucose-Capillary: 180 mg/dL — ABNORMAL HIGH (ref 70–99)
Glucose-Capillary: 123 mg/dL — ABNORMAL HIGH (ref 70–99)

## 2021-03-19 LAB — MAGNESIUM: Magnesium: 2.2 mg/dL (ref 1.7–2.4)

## 2021-03-19 MED ORDER — HYDRALAZINE HCL 50 MG PO TABS
100.0000 mg | ORAL_TABLET | Freq: Three times a day (TID) | ORAL | Status: DC
  Administered 2021-03-19 – 2021-03-22 (×9): 100 mg via ORAL
  Filled 2021-03-19 (×10): qty 2

## 2021-03-19 MED ORDER — SODIUM CHLORIDE 0.9 % IV SOLN
2.0000 g | INTRAVENOUS | Status: DC
Start: 2021-03-19 — End: 2021-03-20
  Administered 2021-03-19: 2 g via INTRAVENOUS
  Filled 2021-03-19: qty 2

## 2021-03-19 MED ORDER — FUROSEMIDE 10 MG/ML IJ SOLN
60.0000 mg | Freq: Two times a day (BID) | INTRAMUSCULAR | Status: DC
  Administered 2021-03-19: 20 mg via INTRAVENOUS
  Administered 2021-03-20: 60 mg via INTRAVENOUS
  Filled 2021-03-19 (×3): qty 6

## 2021-03-19 MED ORDER — POLYETHYLENE GLYCOL 3350 17 G PO PACK
17.0000 g | PACK | Freq: Two times a day (BID) | ORAL | Status: DC
  Administered 2021-03-19 – 2021-03-22 (×6): 17 g via ORAL
  Filled 2021-03-19 (×6): qty 1

## 2021-03-19 MED ORDER — BISACODYL 5 MG PO TBEC: 10.0000 mg | DELAYED_RELEASE_TABLET | Freq: Once | ORAL | Status: DC

## 2021-03-19 MED ORDER — FUROSEMIDE 10 MG/ML IJ SOLN
40.0000 mg | Freq: Once | INTRAMUSCULAR | Status: AC
  Administered 2021-03-19: 40 mg via INTRAVENOUS
  Filled 2021-03-19: qty 4

## 2021-03-19 MED ORDER — ISOSORBIDE MONONITRATE ER 60 MG PO TB24
60.0000 mg | ORAL_TABLET | Freq: Every day | ORAL | Status: DC
  Administered 2021-03-19 – 2021-03-22 (×4): 60 mg via ORAL
  Filled 2021-03-19 (×5): qty 1

## 2021-03-19 NOTE — Progress Notes (Addendum)
PROGRESS NOTE    Robert Chaney  PYP:[id number] DOB: 1950-09-02 DOA: 03/16/2021 PCP: [name], MD    Brief Narrative:  [name] was admitted to the hospital with the working diagnosis of acute on chronic hypoxic respiratory failure due to volume overload, hypertensive emergency and right upper lobe pneumonia.  70 year old male past medical history for type 2 diabetes mellitus, chronic kidney disease stage III, diastolic heart failure, dyslipidemia, hypertension, anemia, and SVT who presented with respiratory distress.  Apparently patient was found hypoxic at home, oxygen saturation 73% despite nonrebreather.  Patient required bag mask ventilation on route to the emergency room.  In the emergency department he was placed on BiPAP. Recent hospitalization 02/28/2021-03/10/2021 for acute on chronic hypoxic respiratory failure, due to heart failure exacerbation and pneumonia.  Patient underwent EGD 9/10, mild gastritis, discharged on pantoprazole.  On further questioning patient felt acutely ill, with sudden dyspnea and air hunger. No swallowing problems. In the past had pulmonary edema, and takes furosemide for diuresis at home.   On his initial physical examination blood pressure 200's to 174/80, heart rate 63, temperature 97.7, respirate 15, oxygen saturation 98%, his lungs had rales and wheezing, heart S1-S2, present, rhythmic, abdomen soft nontender, no lower extremity edema.  Arterial blood gas 7.34/53/156/31.   Sodium 139, potassium 5.7, chloride 108, bicarb 25, glucose 181, BUN 49, creatinine 2.64, BNP 1475, high sensitive troponin 112-98, white count 21.2, hemoglobin 7.6, hematocrit 26.9, platelets 312. SARS COVID-19 negative.  Toxicology screen negative.  Chest radiograph with right upper lobe opacity, right lower lobe atelectasis.  EKG 66 bpm, rightward axis, normal intervals, sinus rhythm, no significant ST segment or T wave changes.  Positive LVH.  Patient was placed on non  invasive mechanical ventilation, placed in antibiotic therapy with cefepime and received diuresis.   He has found to have rectal bleeding, due to anal fissure.   Patient was liberated from noninvasive clinical ventilation.  Transferred to [facility] 9/26.  Assessment & Plan:   Principal Problem:   Acute on chronic respiratory failure with hypoxia and hypercapnia (HCC) Active Problems:   Stage 4 chronic kidney disease (HCC)   Elevated troponin   Hypertensive heart disease   OSA (obstructive sleep apnea)   Type 2 diabetes mellitus with stage 4 chronic kidney disease, with long-term current use of insulin (HCC)   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HCC)   Abnormal CXR   Elevated brain natriuretic peptide (BNP) level   Left thyroid nodule   GI bleed   Acute blood loss anemia   Hematochezia   Chronic posterior anal fissure   Helicobacter pylori gastritis   Chronic constipation   Acute hypoxemic respiratory failure due to acute cardiogenic pulmonary edema in the setting of recent pneumonia/ acute diastolic heart failure and uncontrolled HTN/ hypertensive emergency.  His oxygen saturation is 91 to 95% on 5 L/min per [place], was on 9 L this morning.  Urine output over last 24 hrs 2,300 ml. Systolic blood pressure has been 180 to 190 mmHg.  Chest radiograph from today with continue hilar vascular congestion, with right mid lung opacity, possible fluid in the fissure.   Plan to continue with aggressive diuresis with furosemide 60 mg IV q12 to keep further negative fluid balance. Continue supplemental 02 per [place] to keep oxygen saturation 92% or greater.  Procalcitonin is 9,09, patient had recent hospitalization for pneumonia and received antibiotic therapy, no clinical signs of empyema or complicated infection. If no further sings of pulmonary infection will dc antibiotic in the next  24 hrs.   Continue blood pressure control with carvedilol, hydralazine, amlodipine and will resume isosorbide.   2. CKD stage  4/ hyperkalemia renal function with serum cr at  2,81 with K at 4,7 and serum bicarbonate at 30.  Continue diuresis for hypervolemia.  Follow up renal function in am, avoid hypotension and nephrotoxic medications.   3. T2DM glucose has been elevated at 208, will continue insulin sliding scale for glucose cover and monitoring   Patient continue to be at high risk for worsening   4. Gastritis. Hgb and hct today at 8,3 and 27,3, no signs of active bleeding, continue pantoprazole. Annal fissure, constipation, add dulcolax for bowel movement.   Status is: Inpatient  Remains inpatient appropriate because:Inpatient level of care appropriate due to severity of illness  Dispo: The patient is from: Home              Anticipated d/c is to: Home              Patient currently is not medically stable to d/c.   Difficult to place patient No  DVT prophylaxis: Enoxaparin   Code Status:   full  Family Communication:  No family at the bedside      Subjective: Patient with improvement in dyspnea but not yet back to baseline, no nausea or vomiting, positive constipation.   Objective: Vitals:   03/19/21 1300 03/19/21 1330 03/19/21 1430 03/19/21 1500  BP: (!) 155/73 (!) 156/64 (!) 175/74 (!) 180/131  Pulse: 68 68 69 68  Resp: 18 17 18 17   Temp:      TempSrc:      SpO2: 98% 98% 96% 91%  Weight:        Intake/Output Summary (Last 24 hours) at 03/19/2021 1519 Last data filed at 03/19/2021 0900 Gross per 24 hour  Intake 340 ml  Output 1250 ml  Net -910 ml   Filed Weights   03/17/21 0500 03/18/21 0500 03/19/21 0500  Weight: 75.9 kg 74.6 kg 74.9 kg    Examination:   General: Not in pain or dyspnea, deconditioned  Neurology: Awake and alert, non focal  E ENT: mild pallor, no icterus, oral mucosa moist Cardiovascular: No JVD. S1-S2 present, rhythmic, no gallops, rubs, or murmurs. No lower extremity edema. Pulmonary: positive breath sounds bilaterally with no no wheezing, rhonchi, but  scattered rales. Gastrointestinal. Abdomen soft and non tender Skin. No rashes Musculoskeletal: no joint deformities     Data Reviewed: I have personally reviewed following labs and imaging studies  CBC: Recent Labs  Lab 03/16/21 1953 03/16/21 2035 03/16/21 2154 03/17/21 0400 03/17/21 0405 03/17/21 1212 03/18/21 0505 03/19/21 1101  WBC 21.2*  --  10.6* 15.0*  --   --  10.6* 8.8  NEUTROABS 17.0*  --   --   --   --   --  9.2* 7.7  HGB 7.6*   < > 6.1* 7.2* 7.8* 8.8* 7.7* 8.3*  HCT 26.9*   < > 21.7* 24.8* 23.0* 27.7* 24.8* 27.3*  MCV 100.7*  --  99.5 98.0  --   --  93.6 95.1  PLT 312  --  189 202  --   --  177 194   < > = values in this interval not displayed.   Basic Metabolic Panel: Recent Labs  Lab 03/16/21 1953 03/16/21 2035 03/16/21 2154 03/17/21 0400 03/17/21 0405 03/18/21 0505 03/18/21 0929 03/19/21 1101  NA 139 140  --  139 142 138  --  138  K 5.7*  5.5*  --  5.0 5.0 5.2* 5.6* 4.7  CL 108  --   --  106  --  103  --  103  CO2 25  --   --  26  --  27  --  30  GLUCOSE 181*  --   --  153*  --  117*  --  208*  BUN 49*  --   --  51*  --  58*  --  57*  CREATININE 2.64*  --  2.70* 2.67*  --  2.89*  --  2.81*  CALCIUM 8.6*  --   --  8.7*  --  8.6*  --  8.6*  MG  --   --   --  2.3  --   --   --  2.2   GFR: Estimated Creatinine Clearance: 23 mL/min (A) (by C-G formula based on SCr of 2.81 mg/dL (H)). Liver Function Tests: Recent Labs  Lab 03/16/21 1953 03/19/21 1101  AST 27 9*  ALT 20 12  ALKPHOS 75 62  BILITOT 0.8 0.8  PROT 7.0 6.9  ALBUMIN 2.9* 2.3*   No results for input(s): LIPASE, AMYLASE in the last 168 hours. No results for input(s): AMMONIA in the last 168 hours. Coagulation Profile: Recent Labs  Lab 03/17/21 1212  INR 1.2   Cardiac Enzymes: No results for input(s): CKTOTAL, CKMB, CKMBINDEX, TROPONINI in the last 168 hours. BNP (last 3 results) No results for input(s): PROBNP in the last 8760 hours. HbA1C: No results for input(s): HGBA1C in  the last 72 hours. CBG: Recent Labs  Lab 03/18/21 1128 03/18/21 1655 03/18/21 2143 03/19/21 0730 03/19/21 1116  GLUCAP 169* 121* 167* 123* 175*   Lipid Profile: No results for input(s): CHOL, HDL, LDLCALC, TRIG, CHOLHDL, LDLDIRECT in the last 72 hours. Thyroid Function Tests: No results for input(s): TSH, T4TOTAL, FREET4, T3FREE, THYROIDAB in the last 72 hours. Anemia Panel: No results for input(s): VITAMINB12, FOLATE, FERRITIN, TIBC, IRON, RETICCTPCT in the last 72 hours.    Radiology Studies: I have reviewed all of the imaging during this hospital visit personally     Scheduled Meds:  amLODipine  10 mg Oral Daily   carvedilol  12.5 mg Oral BID WC   Chlorhexidine Gluconate Cloth  6 each Topical Daily   diltiazem   Topical QID   furosemide  40 mg Intravenous Once   hydrALAZINE  100 mg Oral Q8H   insulin aspart  0-9 Units Subcutaneous TID AC & HS   pantoprazole  40 mg Oral BID AC   polyethylene glycol  17 g Oral BID   Continuous Infusions:  ceFEPime (MAXIPIME) IV Stopped (03/18/21 2240)     LOS: 3 days        Ayda Tancredi Gerome Apley, MD

## 2021-03-19 NOTE — Progress Notes (Signed)
  Echocardiogram 2D Echocardiogram has been performed.  Fidel Levy 03/19/2021, 3:05 PM

## 2021-03-19 NOTE — Progress Notes (Signed)
Modified Barium Swallow Progress Note  Patient Details  Name: Robert Chaney MRN: [id number] Date of Birth: 1951/06/06  Today's Date: 03/19/2021  Modified Barium Swallow completed.  Full report located under Chart Review in the Imaging Section.  Brief recommendations include the following:  Clinical Impression  Pt presents with pharyngeal dysphagia characterized by reduced anterior laryngeal movement and a pharyngeal delay which resulted in penetration (PAS 3, 5) of thin and nectar thick liquids. Prompted coughing was ineffective in expelling penetrate. Various postural modifications were attempted and a chin tuck posture with right head turn proved most effective eliminating laryngeal invasion. Mild pyriform sinus residue was noted with liquids and this was cleared with pt's independent use of secondary swallows. Pt's pharyngeal timing and clearance were adequate for solids. A regular texture diet with thin liquids is recommended at this time with observance of swallowing precautions to reduce aspiration risk. SLP will follow for treatment.   Swallow Evaluation Recommendations       SLP Diet Recommendations: Regular solids;Nectar thick liquid   Liquid Administration via: Cup;No straw   Medication Administration: Whole meds with liquid   Supervision: Patient able to self feed;Intermittent supervision to cue for compensatory strategies   Compensations: Slow rate;Small sips/bites;Chin tuck (Chin tuck with right head turn), secondary swallow   Postural Changes: Seated upright at 90 degrees   Oral Care Recommendations: Oral care BID      Danzig Macgregor I. Hardin Negus, [place], [place] Office number [phone] Pager [phone]   Horton Marshall 03/19/2021,3:45 PM

## 2021-03-19 NOTE — Progress Notes (Addendum)
NAME:  Robert Chaney MRN:  [id number] DOB:  October 14, 1950 LOS: 3 ADMISSION DATE:  03/16/2021 DATE OF SERVICE:  03/16/2021 CHIEF COMPLAINT:  dyspnea, altered mental status   History of Present Illness:  This 70 y.o. African American male nonsmoker presented to the [facility] Emergency Department via EMS with complaints of respiratory distress.  Per EMS, the patient was found to be hypoxic.  SpO2 73% despite non-rebreather mask. He was reportedly placed on CPAP and subsequently became unresponsive.  He was supported with bag-mask ventilation and transported to the ER.  He was given DuoNeb and SoluMedrol 125 mg IV single dose.  Of note, he was hypertensive (260/110) and tachycardic (100).  In the ER, the patient has been placed on BiPAP.  He was also noted to be incontinent of bloody stool.  PCCM asked to admit.  Of note, the patient was recently hospitalized for pneumonia and was discharged on 9/17 and was treated with cefepime/vancomycin which was switched to Unasyn.  He underwent bronch/BAL/TBBx/brushings 03/05/21. All unrevealing but no BAL cell count/diff obtained. He is also noted to have HFpER/hypertensive heart disease with diastolic dysfunction.  At the time of clinical interview, the patient is on BiPAP, FiO2 50%.  SpO2 100%.  He is arousable to loud voice and noxious stimuli but does not stay awake for clinical interview.  Pertinent Medical History:   Past Medical History:  Diagnosis Date   Altered mental status    a. 05/2017 - adm with blurred vision, somnolence in setting of AKI and high blood sugar.   Anemia    CKD (chronic kidney disease), stage III (HCC)    Diabetes mellitus    Diastolic CHF, chronic ([place])    A.  03/2009 Echo: EF 60-65%, Gr II diast dysfxn   Elevated troponin    a. 2013 - troponin 1.4. b. 2019 - troponin 0.32; neg nuc 07/2017.   Glaucoma    Hyperlipidemia    HYPERCHOLESTEROLEMIA   Hypertension    MARKED LEFT VENTRICULAR HYPERTROPHY BY PREVIOUS  ECHOCARDIOGRAM--HE HAS HYPERDYNAMIC LEFT VENTRICULAR SYSTOLIC FUNCTION AND HAS IMPAIRED RELAXATION BY ECHO   Hypertrophic cardiomyopathy ([place])    NSVT (nonsustained ventricular tachycardia) (HCC)    Premature atrial contractions    PVC's (premature ventricular contractions)    SVT (supraventricular tachycardia) ([place])    Significant Hospital Events:  9/23 - Admitted respiratory distress, hypoxia. Placed on CPAP and became unresponsive. Bagged, Solumedrol/DuoNeb given and placed on BiPAP. Notably hypertensive to SBP 200s. Bloody BM x 1. 9/26 - Improved respiratory/O2 status, remains on [place]. Continued crackles. S/p Lasix 27m IV with 2.3L UOP. Remains hypertensive, home meds resumed/doses increased to home regimen. Echo with bubble study pending.  Interim History/Subjective:  No significant events overnight Feeling better overall Respiratory status better, lessening O2 requirement Mild SOB/dyspnea with activity Reports no BM since admission, requesting stool softener  Objective:  BP (!) 198/81   Pulse 78   Temp 98.9 F (37.2 C) (Oral)   Resp 20   Wt 74.9 kg   SpO2 96%   BMI 25.11 kg/m     Filed Weights   03/17/21 0500 03/18/21 0500 03/19/21 0500  Weight: 75.9 kg 74.6 kg 74.9 kg    Intake/Output Summary (Last 24 hours) at 03/19/2021 0848 Last data filed at 03/19/2021 0622 Gross per 24 hour  Intake 820 ml  Output 2300 ml  Net -1480 ml     FiO2 (%):  [40 %] 40 %   Physical Examination: General: Acutely  ill-appearing elderly man, sitting up in chair in NAD. HEENT: [place]/AT, anicteric sclera, PERRL, moist mucous membranes. Neuro: Awake, oriented x 4. Responds to verbal stimuli. Following commands consistently. Moves all 4 extremities spontaneously.  CV: RRR, no m/g/r. PULM: Breathing even and unlabored on supplemental O2 via [place]. Lung fields with bibasilar crackles, occasional expiratory wheeze. GI: Soft, nontender, nondistended. Normoactive bowel sounds. Extremities: Trace BLE  edema noted. Skin: Warm/dry, no rashes.  Resolved Hospital Problem List:  Acute toxic metabolic encephalopathy  Assessment & Plan:   ABLA GI bleeding Resolving. Had EGD 9/10 which showed minimal gastritis and submucosal lesion felt most c/w lipoma. Biopsy came back showing H. Pylori with plan for treatment at discharge. Patient also has an anal fissure that was felt to likely be contributory.  - Continue PPI BID - Rectal Diltiazem per GI - Monitor for signs/symptoms of active bleeding - Trend CBC - Transfuse for Hgb < 7.0 - H. Pylori treatment at discharge per GI (see note 03/17/2021)  Acute on chronic hypoxemic respiratory failure: Recurrent aspiration/postobstructive PNA Unclear etiology of underlying obstructive lesion with no endobronchial abnormality seen 9/12) vs. volume overload vs. organizing pneumonia. No BAL cell count/diff from bronch 9/12 - Repeat CXR grossly unchanged - Additional Lasix 18m IV today - Monitor I&Os - Continue cefepime (anticipate 7-day course) - NIV QHS + with naps and PRN, [place] during the day - F/u Echo with bubble  Hypertension Hypertensive to SBP 200s on admission. Remains with SBPs 180s-190s. - Home medications resumed - Increase medication doses back to home doses - Resume Imdur - Consider [place]al Artery Doppler to r/o RAS, given persistent hypertension not responsive to volume removal  CKD Stage 4 - Trend BMP - Replete electrolytes as indicated - Monitor I&Os - Avoid nephrotoxic agents as able - Ensure adequate renal perfusion  HCM Diastolic dysfunction - Diuresis as above - Cautious resumption of home antihypertensives as above  OSA - NIV QHS, with naps and PRN  DM2  - SSI  Best Practice: (right click and "Reselect all SmartList Selections" daily)   Diet/type: Regular consistency (see orders) DVT prophylaxis: other - held in the setting of ABLA, acute GIB; consider resumption GI prophylaxis: PPI Central venous access:   N/A Foley:  N/A Code Status:  full code Last date of multidisciplinary goals of care discussion [Unknown]  Critical care time: N/A   SRhae [facility] 03/19/21 8:52 AM  Please see [email or web address] for pager details.  From 7A-7P if no response, please call [phone] After hours, please call ELink [phone]

## 2021-03-19 NOTE — Progress Notes (Signed)
Progress Note   Subjective  Chief Complaint: Rectal bleeding with anal fissure, chronic constipation and respiratory failure  This morning the patient's only complaint is of his breathing, he tells me he continues to be harder to breathe than normal and he has occasional chest discomfort when breathing.  Does tell me he has not been able to have a bowel movement since admission.  He thinks he is getting some stool softeners but is not quite sure.  Denies abdominal pain or further rectal bleeding.    Objective   Vital signs in last 24 hours: Temp:  [98.1 F (36.7 C)-98.9 F (37.2 C)] 98.9 F (37.2 C) (09/26 0730) Pulse Rate:  [65-78] 78 (09/26 0630) Resp:  [15-26] 20 (09/26 0630) BP: (152-208)/(62-136) 198/81 (09/26 0630) SpO2:  [86 %-100 %] 96 % (09/26 0630) FiO2 (%):  [40 %] 40 % (09/25 2320) Weight:  [74.9 kg] 74.9 kg (09/26 0500) Last BM Date: 03/17/21 General:    AA male in NAD Heart:  Regular rate and rhythm; no murmurs Lungs: Respirations even and unlabored, lungs CTA bilaterally + on O2 via nasal cannula Abdomen:  Soft, nontender and nondistended. Normal bowel sounds. Psych:  Cooperative. Normal mood and affect.  Intake/Output from previous day: 09/25 0701 - 09/26 0700 In: 1060 [P.O.:960; IV Piggyback:100] Out: 2300 [Urine:2300]   Lab Results: Recent Labs    03/16/21 2154 03/17/21 0400 03/17/21 0405 03/17/21 1212 03/18/21 0505  WBC 10.6* 15.0*  --   --  10.6*  HGB 6.1* 7.2* 7.8* 8.8* 7.7*  HCT 21.7* 24.8* 23.0* 27.7* 24.8*  PLT 189 202  --   --  177   BMET Recent Labs    03/16/21 1953 03/16/21 2035 03/16/21 2154 03/17/21 0400 03/17/21 0405 03/18/21 0505 03/18/21 0929  NA 139   < >  --  139 142 138  --   K 5.7*   < >  --  5.0 5.0 5.2* 5.6*  CL 108  --   --  106  --  103  --   CO2 25  --   --  26  --  27  --   GLUCOSE 181*  --   --  153*  --  117*  --   BUN 49*  --   --  51*  --  58*  --   CREATININE 2.64*  --  2.70* 2.67*  --  2.89*  --    CALCIUM 8.6*  --   --  8.7*  --  8.6*  --    < > = values in this interval not displayed.   LFT Recent Labs    03/16/21 1953  PROT 7.0  ALBUMIN 2.9*  AST 27  ALT 20  ALKPHOS 75  BILITOT 0.8   PT/INR Recent Labs    03/17/21 1212  LABPROT 15.0  INR 1.2    Studies/Results: DG CHEST PORT 1 VIEW  Result Date: 03/19/2021 CLINICAL DATA:  Dyspnea. EXAM: PORTABLE CHEST 1 VIEW COMPARISON:  03/17/2021 FINDINGS: AP view of the chest again demonstrates airspace disease and consolidation in the mid right lung. There appears to be a small amount of pleural fluid particularly on the right side. Hazy densities in the left lung may represent a small amount of left lung airspace disease. Heart size is enlarged but stable. Atherosclerotic calcifications at the aortic arch. Trachea is midline. Negative for a pneumothorax. IMPRESSION: 1. No significant change in the airspace disease and consolidation in the right lung. Suspect a  small amount of pleural fluid. 2. Probable mild airspace disease in left lung. Electronically Signed   By: Markus Daft M.D.   On: 03/19/2021 08:54   VAS [place] LOWER EXTREMITY VENOUS (DVT)  Result Date: 03/17/2021  Lower Venous DVT Study Patient Name:  ASIM GERSTEN  Date of Exam:   03/17/2021 Medical Rec #: [id number]      Accession #:    [id number] Date of Birth: 04-Mar-1951       Patient Gender: M Patient Age:   70 years Exam Location:  [facility] Procedure:      VAS [place] LOWER EXTREMITY VENOUS (DVT) Referring Phys: Renee Pain --------------------------------------------------------------------------------  Indications: Edema.  Limitations: Acoustic shadowing due to overlying calcification. Comparison Study: 01-14-2020 Bilateral lower extremity venous study was negative                   for DVT.                    01-08-2021 ABI w/ TBI showed bilateral absent DPA, moderate                   right arterial disease, mild left arterial disease. Performing Technologist: Darlin Coco RDMS, RVT  Examination Guidelines: A complete evaluation includes B-mode imaging, spectral Doppler, color Doppler, and power Doppler as needed of all accessible portions of each vessel. Bilateral testing is considered an integral part of a complete examination. Limited examinations for reoccurring indications may be performed as noted. The reflux portion of the exam is performed with the patient in reverse Trendelenburg.  +---------+---------------+---------+-----------+----------+--------------+ RIGHT    CompressibilityPhasicitySpontaneityPropertiesThrombus Aging +---------+---------------+---------+-----------+----------+--------------+ CFV      Full           Yes      Yes                                 +---------+---------------+---------+-----------+----------+--------------+ SFJ      Full                                                        +---------+---------------+---------+-----------+----------+--------------+ FV Prox  Full                                                        +---------+---------------+---------+-----------+----------+--------------+ FV Mid   Full                                                        +---------+---------------+---------+-----------+----------+--------------+ FV DistalFull                                                        +---------+---------------+---------+-----------+----------+--------------+ PFV      Full                                                        +---------+---------------+---------+-----------+----------+--------------+  POP      Full           Yes      Yes                                 +---------+---------------+---------+-----------+----------+--------------+ PTV      Full                                                        +---------+---------------+---------+-----------+----------+--------------+ PERO     Full                                                         +---------+---------------+---------+-----------+----------+--------------+ Gastroc  Full                                                        +---------+---------------+---------+-----------+----------+--------------+   +---------+---------------+---------+-----------+----------+--------------+ LEFT     CompressibilityPhasicitySpontaneityPropertiesThrombus Aging +---------+---------------+---------+-----------+----------+--------------+ CFV      Full           Yes      Yes                                 +---------+---------------+---------+-----------+----------+--------------+ SFJ      Full                                                        +---------+---------------+---------+-----------+----------+--------------+ FV Prox  Full                                                        +---------+---------------+---------+-----------+----------+--------------+ FV Mid   Full                                                        +---------+---------------+---------+-----------+----------+--------------+ FV DistalFull                                                        +---------+---------------+---------+-----------+----------+--------------+ PFV      Full                                                        +---------+---------------+---------+-----------+----------+--------------+  POP      Full           Yes      Yes                                 +---------+---------------+---------+-----------+----------+--------------+ PTV      Full                                                        +---------+---------------+---------+-----------+----------+--------------+ PERO     Full                                                        +---------+---------------+---------+-----------+----------+--------------+ Gastroc  Full                                                         +---------+---------------+---------+-----------+----------+--------------+     Summary: RIGHT: - There is no evidence of deep vein thrombosis in the lower extremity.  - No cystic structure found in the popliteal fossa.  LEFT: - There is no evidence of deep vein thrombosis in the lower extremity.  - No cystic structure found in the popliteal fossa.  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 *See table(s) above for measurements and observations. Electronically signed by Servando Snare MD on 03/17/2021 at 5:23:46 PM.    Final        Assessment / Plan:   Assessment: 1.  Anal fissure with rectal bleeding: No further bleeding since admission, but has not had a bowel movement 2.  Constipation 3.  H. pylori gastritis: Recent EGD with finding of H. pylori gastritis not yet started on treatment 4.  Respiratory failure  Plan: 1.  Patient has been on once daily MiraLAX since Saturday morning, increased to twice daily today.  Has also been getting Colace and would continue this. 2.  Continue to monitor hemoglobin, CBC was not ordered for this morning, put in an order.  Ordered CMP. 3.  As outlined by Dr. Carlean Purl in consult note he needs treatment for H. pylori upon discharge 4.  Patient will also need outpatient colonoscopy with Dr. Lyndel Safe 5.  At time of discharge patient will need appointment with app in the clinic to follow-up 3 to 4 weeks  Thank you for your kind consultation, pending CBC results you may sign off today.   LOS: 3 days   Levin Erp  03/19/2021, 9:09 AM

## 2021-03-19 NOTE — Progress Notes (Signed)
PT Cancellation Note  Patient Details Name: Robert Chaney MRN: [id number] DOB: May 30, 1951   Cancelled Treatment:    Reason Eval/Treat Not Completed: Patient declined, no reason specified (pt reports fatigue at present and denied mobility. Attempted to see pt 3x today pt eating, extreme HTN, now refusal. Will attempt next date.)   Jimya Ciani B Ria Redcay 03/19/2021, 12:45 PM Bayard Males, PT Acute Rehabilitation Services Pager: [phone] Office: [phone]

## 2021-03-20 ENCOUNTER — Inpatient Hospital Stay (HOSPITAL_COMMUNITY): Payer: PPO

## 2021-03-20 DIAGNOSIS — D62 Acute posthemorrhagic anemia: Secondary | ICD-10-CM | POA: Diagnosis not present

## 2021-03-20 DIAGNOSIS — J9621 Acute and chronic respiratory failure with hypoxia: Secondary | ICD-10-CM | POA: Diagnosis not present

## 2021-03-20 DIAGNOSIS — R9389 Abnormal findings on diagnostic imaging of other specified body structures: Secondary | ICD-10-CM | POA: Diagnosis not present

## 2021-03-20 DIAGNOSIS — K601 Chronic anal fissure: Secondary | ICD-10-CM | POA: Diagnosis not present

## 2021-03-20 LAB — BASIC METABOLIC PANEL
Anion gap: 6 (ref 5–15)
BUN: 62 mg/dL — ABNORMAL HIGH (ref 8–23)
CO2: 28 mmol/L (ref 22–32)
Calcium: 8.4 mg/dL — ABNORMAL LOW (ref 8.9–10.3)
Chloride: 101 mmol/L (ref 98–111)
Creatinine, Ser: 2.83 mg/dL — ABNORMAL HIGH (ref 0.61–1.24)
GFR, Estimated: 23 mL/min — ABNORMAL LOW (ref >60)
Glucose, Bld: 170 mg/dL — ABNORMAL HIGH (ref 70–99)
Potassium: 4.2 mmol/L (ref 3.5–5.1)
Sodium: 135 mmol/L (ref 135–145)

## 2021-03-20 LAB — CBC
HCT: 25.8 % — ABNORMAL LOW (ref 39.0–52.0)
Hemoglobin: 8 g/dL — ABNORMAL LOW (ref 13.0–17.0)
MCH: 29.2 pg (ref 26.0–34.0)
MCHC: 31 g/dL (ref 30.0–36.0)
MCV: 94.2 fL (ref 80.0–100.0)
Platelets: 183 10*3/uL (ref 150–400)
RBC: 2.74 MIL/uL — ABNORMAL LOW (ref 4.22–5.81)
RDW: 16.5 % — ABNORMAL HIGH (ref 11.5–15.5)
WBC: 6.6 10*3/uL (ref 4.0–10.5)
nRBC: 0 % (ref 0.0–0.2)

## 2021-03-20 LAB — GLUCOSE, CAPILLARY
Glucose-Capillary: 157 mg/dL — ABNORMAL HIGH (ref 70–99)
Glucose-Capillary: 179 mg/dL — ABNORMAL HIGH (ref 70–99)
Glucose-Capillary: 155 mg/dL — ABNORMAL HIGH (ref 70–99)
Glucose-Capillary: 152 mg/dL — ABNORMAL HIGH (ref 70–99)

## 2021-03-20 LAB — LEGIONELLA PNEUMOPHILA SEROGP 1 UR AG: L. pneumophila Serogp 1 Ur Ag: NEGATIVE

## 2021-03-20 MED ORDER — FUROSEMIDE 40 MG PO TABS
60.0000 mg | ORAL_TABLET | Freq: Two times a day (BID) | ORAL | Status: DC
  Administered 2021-03-20: 60 mg via ORAL
  Filled 2021-03-20: qty 1

## 2021-03-20 NOTE — Progress Notes (Signed)
Mobility Specialist Progress Note:   03/20/21 1405  Mobility  Activity Ambulated in hall  Range of Motion/Exercises Active;All extremities  Level of Assistance Contact guard assist, steadying assist  Assistive Device None  Minutes Ambulated 5 minutes  Distance Ambulated (ft) 520 ft  Mobility Ambulated with assistance in hallway  Mobility Response Tolerated well  Mobility performed by Mobility specialist  Bed Position Chair  Transport method Ambulatory  $Mobility charge 1 Mobility   Pt was received in chair and required encouragement to agree to mobility. Ambulated in hallway 68' with no AD. Required contact G due to slight LOB during ambulation. Pt otherwise asx. Left in chair with call bell in reach.   Nelta Numbers Mobility Specialist  Phone [phone]

## 2021-03-20 NOTE — Progress Notes (Signed)
Speech Language Pathology Treatment: Dysphagia  Patient Details Name: Robert Chaney MRN: [id number] DOB: January 11, 1951 Today's Date: 03/20/2021 Time: 3570-1779 SLP Time Calculation (min) (ACUTE ONLY): 8 min  Assessment / Plan / Recommendation Clinical Impression  Followed up for diet tolerance, education following recent MBSS, and pt adherence to postural manuevers s/p MBSS to maximize swallow safety. Pt independently recalled head turn to the right with chin tuck and was able to implement appropriately. He states he has noted elimination of coughing with use of strategy, though will take time to get used to the postural maneuver with PO consumption. Assessed with thin liquids this date, no overt s/sx of aspiration exhibited with thins with head turn to the right and chin tuck. Pt declined solid snack, noting he was full from lunch. SLP will follow x1 during meal to ensure diet tolerance.    HPI HPI: This 70 y.o. African American male nonsmoker presented to the [facility] Emergency Department via EMS with complaints of respiratory distress.  Per EMS, the patient was found to be hypoxic.  SpO2 73% despite non-rebreather mask. He was reportedly placed on CPAP and subsequently became unresponsive.  He was supported with bag-mask ventilation and transported to the ER.  In the ER, the patient has been placed on BiPAP.Of note, the patient was recently hospitalized for pneumonia and was discharged on 9/17 and was treated with cefepime/vancomycin which was switched to Unasyn.  He underwent bronch/BAL/TBBx/brushings 03/05/21. All unrevealing but no BAL cell count/diff obtained. MD concerned about potential aspiration as patient has had recurrent PNA's recently.      SLP Plan  Continue with current plan of care      Recommendations for follow up therapy are one component of a multi-disciplinary discharge planning process, led by the attending physician.  Recommendations may be updated based on  patient status, additional functional criteria and insurance authorization.    Recommendations  Diet recommendations: Regular;Thin liquid Liquids provided via: Cup;Straw Medication Administration: Whole meds with puree (vs whole with thins right head turn chin tuck) Supervision: Patient able to self feed Compensations: Slow rate;Small sips/bites;Chin tuck (head turn right) Postural Changes and/or Swallow Maneuvers: Seated upright 90 degrees;Upright 30-60 min after meal;Chin tuck;Head turn right during swallow                Oral Care Recommendations: Oral care BID;Patient independent with oral care Follow up Recommendations: None SLP Visit Diagnosis: Dysphagia, pharyngeal phase (R13.13) Plan: Continue with current plan of care       GO                Robert Rasmussen MA, CCC-SLP Acute Rehabilitation Services    03/20/2021, 3:42 PM

## 2021-03-20 NOTE — Care Management Important Message (Signed)
Important Message  Patient Details  Name: Robert Chaney MRN: [id number] Date of Birth: 16-Aug-1950   Medicare Important Message Given:  Yes     [name] 03/20/2021, 12:07 PM
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
## 2021-03-20 NOTE — Care Management Important Message (Deleted)
Important Message  Patient Details  Name: FARLEY CROOKER MRN: [id number] Date of Birth: 04-07-1951   Medicare Important Message Given:  Yes     [name] 03/20/2021, 12:08 PM

## 2021-03-20 NOTE — Progress Notes (Addendum)
PROGRESS NOTE    Robert Chaney  UUV:[id number] DOB: 08-30-1950 DOA: 03/16/2021 PCP: [name], MD    Brief Narrative:  Robert Chaney was admitted to the hospital with the working diagnosis of acute on chronic hypoxic respiratory failure due to volume overload, hypertensive emergency.   70 year old male past medical history for type 2 diabetes mellitus, chronic kidney disease stage III, diastolic heart failure, dyslipidemia, hypertension, anemia, chronic hypoxemic respiratory failure and SVT who presented with respiratory distress.  Apparently Robert Chaney was found hypoxic at home, oxygen saturation 73% despite nonrebreather.  Robert Chaney required bag mask ventilation on route to the emergency room.  In the emergency department he was placed on BiPAP. Recent hospitalization 02/28/2021-03/10/2021 for acute on chronic hypoxic respiratory failure, due to heart failure exacerbation and pneumonia.  Robert Chaney underwent EGD 9/10, mild gastritis, discharged on pantoprazole.   On further questioning Robert Chaney felt acutely ill, with sudden dyspnea and air hunger. No swallowing problems. In the past had pulmonary edema, and takes furosemide for diuresis at home.    On his initial physical examination blood pressure 200's to 174/80, heart rate 63, temperature 97.7, respirate 15, oxygen saturation 98%, his lungs had rales and wheezing, heart S1-S2, present, rhythmic, abdomen soft nontender, no lower extremity edema.   Arterial blood gas 7.34/53/156/31.   Sodium 139, potassium 5.7, chloride 108, bicarb 25, glucose 181, BUN 49, creatinine 2.64, BNP 1475, high sensitive troponin 112-98, white count 21.2, hemoglobin 7.6, hematocrit 26.9, platelets 312. SARS COVID-19 negative.   Toxicology screen negative.   Chest radiograph with right upper lobe opacity, right lower lobe atelectasis.   EKG 66 bpm, rightward axis, normal intervals, sinus rhythm, no significant ST segment or T wave changes.  Positive LVH.   Robert Chaney was  placed on non invasive mechanical ventilation, placed in antibiotic therapy with cefepime and received diuresis.    He has found to have rectal bleeding, due to anal fissure.    Robert Chaney was liberated from noninvasive clinical ventilation.   Transferred to [facility] 9/26.  Continue diuresis and blood pressure control.   Assessment & Plan:   Principal Problem:   Acute on chronic respiratory failure with hypoxia and hypercapnia (HCC) Active Problems:   Stage 4 chronic kidney disease (HCC)   Elevated troponin   Hypertensive heart disease   OSA (obstructive sleep apnea)   Type 2 diabetes mellitus with stage 4 chronic kidney disease, with long-term current use of insulin (HCC)   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HCC)   Abnormal CXR   Elevated brain natriuretic peptide (BNP) level   Left thyroid nodule   GI bleed   Acute blood loss anemia   Hematochezia   Chronic posterior anal fissure   Helicobacter pylori gastritis   Chronic constipation     Acute hypoxemic respiratory failure due to acute cardiogenic pulmonary edema in the setting of recent pneumonia/ acute diastolic heart failure and uncontrolled HTN/ hypertensive emergency.  Robert Chaney on home 02, today his oxymetry is 100% on 3.5 L/min per [place].  Dyspnea continue to improve.  Documented urine output over last 24 hrs 950 ml and since admission 6,245 ml of negative fluid balance.  Follow up chest film today, personally reviewed continue to improve  infiltrates with diuresis, including right mid lung opacity, that possible is fluid around the fissure.   Procalcitonin is 9,09, Robert Chaney had recent hospitalization for pneumonia and received antibiotic therapy, no clinical signs of empyema or complicated infection. Will discontinue antibiotic therapy, pneumonia ruled out.    Plan to continue diuresis with  furosemide today and change to oral in am.  Blood pressure control with carvedilol, hydralazine, amlodipine and will resume isosorbide.    2. CKD  stage 4/ hyperkalemia  Renal function has remained stable with serum cr at 2,83, with K at 4,2 and serum bicarbonate at 28.  Continue blood pressure control and will transition to oral diuretic therapy in am,.    3. T2DM Fasting glucose today is 170 mg/dl.  Continue insulin sliding scale for glucose cover and monitoring    4. Gastritis. ON pantoprazole. No clinical signs of bleeding.  Annal fissure, constipation, add dulcolax for bowel movement.     Status is: Inpatient  Remains inpatient appropriate because:Inpatient level of care appropriate due to severity of illness  Dispo: The Robert Chaney is from: Home              Anticipated d/c is to: Home              Robert Chaney currently is not medically stable to d/c.   Difficult to place Robert Chaney No   DVT prophylaxis: Scd   Code Status:    full  Family Communication:  I spoke with Robert Chaney's siblings  at the bedside, we talked in detail about Robert Chaney's condition, plan of care and prognosis and all questions were addressed.     Consultants:  GI   Subjective: Robert Chaney continue with improvement in dyspnea, no chest pain, no nausea or vomiting,.   Objective: Vitals:   03/20/21 0512 03/20/21 0625 03/20/21 0726 03/20/21 1204  BP: (!) 188/141  (!) 157/71 (!) 113/58  Pulse: 71  69 66  Resp: 20 17    Temp: 98 F (36.7 C)  99 F (37.2 C) 98.8 F (37.1 C)  TempSrc:   Oral Oral  SpO2: 100%  98% 100%  Weight: 71.2 kg       Intake/Output Summary (Last 24 hours) at 03/20/2021 1635 Last data filed at 03/20/2021 0844 Gross per 24 hour  Intake 480 ml  Output 1650 ml  Net -1170 ml   Filed Weights   03/18/21 0500 03/19/21 0500 03/20/21 0512  Weight: 74.6 kg 74.9 kg 71.2 kg    Examination:   General: Not in pain or dyspnea, Neurology: Awake and alert, non focal  E ENT: no pallor, no icterus, oral mucosa moist Cardiovascular: No JVD. S1-S2 present, rhythmic, no gallops, rubs, or murmurs. No lower extremity edema. Pulmonary: positive  breath sounds bilaterally, with wheezing, or rhonchi, positive bilateral rales at bases more right than left.  Gastrointestinal. Abdomen soft and non tender Skin. No rashes Musculoskeletal: no joint deformities     Data Reviewed: I have personally reviewed following labs and imaging studies  CBC: Recent Labs  Lab 03/16/21 1953 03/16/21 2035 03/16/21 2154 03/17/21 0400 03/17/21 0405 03/17/21 1212 03/18/21 0505 03/19/21 1101 03/20/21 0521  WBC 21.2*  --  10.6* 15.0*  --   --  10.6* 8.8 6.6  NEUTROABS 17.0*  --   --   --   --   --  9.2* 7.7  --   HGB 7.6*   < > 6.1* 7.2* 7.8* 8.8* 7.7* 8.3* 8.0*  HCT 26.9*   < > 21.7* 24.8* 23.0* 27.7* 24.8* 27.3* 25.8*  MCV 100.7*  --  99.5 98.0  --   --  93.6 95.1 94.2  PLT 312  --  189 202  --   --  177 194 183   < > = values in this interval not displayed.   Basic Metabolic Panel:  Recent Labs  Lab 03/16/21 1953 03/16/21 2035 03/16/21 2154 03/17/21 0400 03/17/21 0405 03/18/21 0505 03/18/21 0929 03/19/21 1101 03/20/21 0521  NA 139   < >  --  139 142 138  --  138 135  K 5.7*   < >  --  5.0 5.0 5.2* 5.6* 4.7 4.2  CL 108  --   --  106  --  103  --  103 101  CO2 25  --   --  26  --  27  --  30 28  GLUCOSE 181*  --   --  153*  --  117*  --  208* 170*  BUN 49*  --   --  51*  --  58*  --  57* 62*  CREATININE 2.64*  --  2.70* 2.67*  --  2.89*  --  2.81* 2.83*  CALCIUM 8.6*  --   --  8.7*  --  8.6*  --  8.6* 8.4*  MG  --   --   --  2.3  --   --   --  2.2  --    < > = values in this interval not displayed.   GFR: Estimated Creatinine Clearance: 22.8 mL/min (A) (by C-G formula based on SCr of 2.83 mg/dL (H)). Liver Function Tests: Recent Labs  Lab 03/16/21 1953 03/19/21 1101  AST 27 9*  ALT 20 12  ALKPHOS 75 62  BILITOT 0.8 0.8  PROT 7.0 6.9  ALBUMIN 2.9* 2.3*   No results for input(s): LIPASE, AMYLASE in the last 168 hours. No results for input(s): AMMONIA in the last 168 hours. Coagulation Profile: Recent Labs  Lab  03/17/21 1212  INR 1.2   Cardiac Enzymes: No results for input(s): CKTOTAL, CKMB, CKMBINDEX, TROPONINI in the last 168 hours. BNP (last 3 results) No results for input(s): PROBNP in the last 8760 hours. HbA1C: No results for input(s): HGBA1C in the last 72 hours. CBG: Recent Labs  Lab 03/19/21 1647 03/19/21 2106 03/20/21 0605 03/20/21 1104 03/20/21 1603  GLUCAP 180* 123* 157* 179* 155*   Lipid Profile: No results for input(s): CHOL, HDL, LDLCALC, TRIG, CHOLHDL, LDLDIRECT in the last 72 hours. Thyroid Function Tests: No results for input(s): TSH, T4TOTAL, FREET4, T3FREE, THYROIDAB in the last 72 hours. Anemia Panel: No results for input(s): VITAMINB12, FOLATE, FERRITIN, TIBC, IRON, RETICCTPCT in the last 72 hours.    Radiology Studies: I have reviewed all of the imaging during this hospital visit personally     Scheduled Meds:  amLODipine  10 mg Oral Daily   bisacodyl  10 mg Oral Once   carvedilol  12.5 mg Oral BID WC   Chlorhexidine Gluconate Cloth  6 each Topical Daily   diltiazem   Topical QID   furosemide  60 mg Intravenous Q12H   hydrALAZINE  100 mg Oral Q8H   insulin aspart  0-9 Units Subcutaneous TID AC & HS   isosorbide mononitrate  60 mg Oral Daily   pantoprazole  40 mg Oral BID AC   polyethylene glycol  17 g Oral BID   Continuous Infusions:  ceFEPime (MAXIPIME) IV 2 g (03/19/21 2210)     LOS: 4 days        Matilde Pottenger Gerome Apley, MD

## 2021-03-21 DIAGNOSIS — I110 Hypertensive heart disease with heart failure: Secondary | ICD-10-CM | POA: Diagnosis not present

## 2021-03-21 DIAGNOSIS — I421 Obstructive hypertrophic cardiomyopathy: Secondary | ICD-10-CM | POA: Diagnosis not present

## 2021-03-21 DIAGNOSIS — D638 Anemia in other chronic diseases classified elsewhere: Secondary | ICD-10-CM | POA: Diagnosis not present

## 2021-03-21 DIAGNOSIS — Z9989 Dependence on other enabling machines and devices: Secondary | ICD-10-CM

## 2021-03-21 DIAGNOSIS — J9621 Acute and chronic respiratory failure with hypoxia: Secondary | ICD-10-CM | POA: Diagnosis not present

## 2021-03-21 DIAGNOSIS — I5033 Acute on chronic diastolic (congestive) heart failure: Secondary | ICD-10-CM

## 2021-03-21 LAB — GLUCOSE, CAPILLARY
Glucose-Capillary: 185 mg/dL — ABNORMAL HIGH (ref 70–99)
Glucose-Capillary: 163 mg/dL — ABNORMAL HIGH (ref 70–99)
Glucose-Capillary: 147 mg/dL — ABNORMAL HIGH (ref 70–99)
Glucose-Capillary: 250 mg/dL — ABNORMAL HIGH (ref 70–99)

## 2021-03-21 LAB — BASIC METABOLIC PANEL
Anion gap: 10 (ref 5–15)
BUN: 70 mg/dL — ABNORMAL HIGH (ref 8–23)
CO2: 25 mmol/L (ref 22–32)
Calcium: 8.5 mg/dL — ABNORMAL LOW (ref 8.9–10.3)
Chloride: 100 mmol/L (ref 98–111)
Creatinine, Ser: 3.12 mg/dL — ABNORMAL HIGH (ref 0.61–1.24)
GFR, Estimated: 20 mL/min — ABNORMAL LOW (ref >60)
Glucose, Bld: 96 mg/dL (ref 70–99)
Potassium: 4.1 mmol/L (ref 3.5–5.1)
Sodium: 135 mmol/L (ref 135–145)

## 2021-03-21 MED ORDER — CARVEDILOL 6.25 MG PO TABS
18.7500 mg | ORAL_TABLET | Freq: Two times a day (BID) | ORAL | Status: DC
  Administered 2021-03-21 – 2021-03-22 (×3): 18.75 mg via ORAL
  Filled 2021-03-21 (×3): qty 1

## 2021-03-21 NOTE — Plan of Care (Signed)

## 2021-03-21 NOTE — Progress Notes (Signed)
Follow up arranged with cardiology.

## 2021-03-21 NOTE — Progress Notes (Signed)
PROGRESS NOTE  Robert Chaney IOX:[id number] DOB: February 28, 1951   PCP: [name], MD  Patient is from: Home.  Lives alone.  Ambulates independently at baseline.  DOA: 03/16/2021 LOS: 5  Chief complaints:  Chief Complaint  Patient presents with   Respiratory Distress     Brief Narrative / Interim history: 70 year old M with PMH of diastolic CHF, HOCM, OSA, CKD-4, DM-2, chronic hypoxic RF on 4 L, HTN, HLD, anemia and recent hospitalization from 9/7-02/2016 for acute on chronic hypoxic RF due to CHF exacerbation and pneumonia, and anemia.  He returns with dyspnea and hypoxemia to 73% on NRB, and admitted with working diagnosis of acute on chronic hypoxic respiratory failure due to diastolic CHF exacerbation, pneumonia and hypertensive emergency.  He was started on BiPAP, IV Lasix and IV antibiotics.  He was liberated off BiPAP and transferred to [facility] service.  Active pneumonia felt to be less likely, and antibiotics discontinued.   Patient had worsening renal function.  Diuretics held.  Subjective: Seen and examined earlier this morning.  No major events overnight of this morning.  No complaints.  He denies chest pain, dyspnea, orthopnea or PND.  He denies GI or UTI symptoms.  Sitting on bedside chair after he returned from ambulation with mobility specialist.  Objective: Vitals:   03/20/21 2015 03/20/21 2259 03/21/21 0423 03/21/21 0730  BP: (!) 119/58  137/75 (!) 153/97  Pulse: 65  75 67  Resp: 20 15 18    Temp: 98.1 F (36.7 C)  98.5 F (36.9 C) 98.8 F (37.1 C)  TempSrc: Oral  Oral Oral  SpO2: 98% 98% 97% 99%  Weight:   70.5 kg     Intake/Output Summary (Last 24 hours) at 03/21/2021 1508 Last data filed at 03/21/2021 0911 Gross per 24 hour  Intake 1080 ml  Output 1150 ml  Net -70 ml   Filed Weights   03/19/21 0500 03/20/21 0512 03/21/21 0423  Weight: 74.9 kg 71.2 kg 70.5 kg    Examination:  GENERAL: Sitting on bedside chair.  No distress but feels cold.  HEENT: MMM.   Vision and hearing grossly intact.  NECK: Supple.  No apparent JVD.  RESP:  No IWOB.  Fair aeration bilaterally. CVS:  RRR. Heart sounds normal.  ABD/GI/GU: BS+. Abd soft, NTND.  MSK/EXT:  Moves extremities. No apparent deformity. No edema.  SKIN: no apparent skin lesion or wound NEURO: Awake, alert and oriented appropriately.  No apparent focal neuro deficit. PSYCH: Calm. Normal affect.    Procedures:  None  Microbiology summarized: QASTM-19 and influenza PCR nonreactive. MRSA PCR screen reactive.  Assessment & Plan: Acute on chronic hypoxic RF due to acute cardiogenic pulmonary edema in the setting of acute on chronic diastolic heart failure and hypertensive emergency-reportedly hypoxic to 73% with NRB.  CXR with RML and RLL opacity. TTE bubble study with LVEF of 65 to 70% and concern for intrapulmonary shunting.  BNP elevated to 1500 (slightly higher than baseline).  Improved with IV Lasix.  He had 2.0 L UOP/24 hours.  Net -6.6 L.  Rapid improvement in CXR and markedly hypertensive on presentation favor CHF exacerbation versus pneumonia.  He recently completed antibiotic course for pneumonia.  He is currently back on home 4 L.  Unfortunately, his creatinine continues to rise which precludes further diuresis. -Hold diuretics today -Pulm-recommended card consult about pulmonary shunting. "Nothing to do from cardiology stand point" -Recheck renal function in the morning -Sodium and fluid restriction  Hypertensive emergency: BP improved. -Hold diuretics due  to renal function -Increase Coreg to 18.75 mg twice daily -Continue hydralazine, amlodipine and Imdur -As needed labetalol  CKD-4/azotemia?  Creatinine continue to rise with diuretics but less than his values when he was discharged from the hospital on 9/16.  Followed by Dr. Carolin Sicks with [facility]. Recent Labs    03/06/21 0010 03/07/21 0013 03/08/21 0149 03/09/21 0035 03/16/21 1953 03/16/21 2154 03/17/21 0400 03/18/21 0505  03/19/21 1101 03/20/21 0521 03/21/21 0301  BUN 50* 65* 68* 51* 49*  --  51* 58* 57* 62* 70*  CREATININE 3.00* 3.78* 3.52* 2.53* 2.64* 2.70* 2.67* 2.89* 2.81* 2.83* 3.12*  -Hold diuretics today -Recheck renal function in the morning -Nephrology consult if worse.  Hyperkalemia: Resolved.   IDDM-2 with hyperglycemia: A1c 5.8% in 7/22. Recent Labs  Lab 03/20/21 1104 03/20/21 1603 03/20/21 2108 03/21/21 0616 03/21/21 1122  GLUCAP 179* 155* 152* 185* 163*  -Continue current insulin regimen  Anemia of chronic disease: H&H stable.  Recent EGD with gastritis. Recent Labs    03/09/21 0035 03/16/21 1953 03/16/21 2035 03/16/21 2154 03/17/21 0400 03/17/21 0405 03/17/21 1212 03/18/21 0505 03/19/21 1101 03/20/21 0521  HGB 8.6* 7.6* 7.1* 6.1* 7.2* 7.8* 8.8* 7.7* 8.3* 8.0*  -Continue monitoring  Gastritis: EGD 09/10 with gastritis.  Pathology with marked nonspecific reactive gastropathy  and chronic gastritis.  H. pylori negative. -Continue PPI.  OSA:  -Nightly CPAP    Body mass index is 23.63 kg/m.         DVT prophylaxis:  Place and maintain sequential compression device Start: 03/17/21 0616  Code Status: Full code Family Communication: Patient and/or RN. Available if any question.  Level of care: Telemetry Cardiac Status is: Inpatient  Remains inpatient appropriate because:Inpatient level of care appropriate due to severity of illness  Dispo: The patient is from: Home              Anticipated d/c is to: Home              Patient currently is not medically stable to d/c.   Difficult to place patient No       Consultants:  PCCM-signed off Cardiology   Sch Meds:  Scheduled Meds:  amLODipine  10 mg Oral Daily   bisacodyl  10 mg Oral Once   carvedilol  18.75 mg Oral BID WC   diltiazem   Topical QID   hydrALAZINE  100 mg Oral Q8H   insulin aspart  0-9 Units Subcutaneous TID AC & HS   isosorbide mononitrate  60 mg Oral Daily   pantoprazole  40 mg  Oral BID AC   polyethylene glycol  17 g Oral BID   Continuous Infusions: PRN Meds:.albuterol, docusate sodium, labetalol, polyethylene glycol  Antimicrobials: Anti-infectives (From admission, onward)    Start     Dose/Rate Route Frequency Ordered Stop   03/19/21 2145  ceFEPIme (MAXIPIME) 2 g in sodium chloride 0.9 % 100 mL IVPB  Status:  Discontinued        2 g 200 mL/hr over 30 Minutes Intravenous Every 24 hours 03/19/21 1621 03/20/21 1649   03/17/21 2200  clarithromycin (BIAXIN) tablet 250 mg  Status:  Discontinued        250 mg Oral Every 12 hours 03/17/21 1142 03/17/21 1214   03/17/21 1600  metroNIDAZOLE (FLAGYL) 50 mg/ml oral suspension 500 mg  Status:  Discontinued        500 mg Oral 3 times daily 03/17/21 1142 03/17/21 1214   03/17/21 1200  clarithromycin (BIAXIN) 125 MG/5ML suspension  250 mg  Status:  Discontinued        250 mg Per Tube Every 12 hours 03/17/21 1101 03/17/21 1103   03/17/21 1130  metroNIDAZOLE (FLAGYL) 50 mg/ml oral suspension 500 mg  Status:  Discontinued        500 mg Per Tube 3 times daily 03/17/21 1101 03/17/21 1142   03/17/21 1130  clarithromycin (BIAXIN) tablet 250 mg  Status:  Discontinued        250 mg Per Tube Every 12 hours 03/17/21 1103 03/17/21 1142   03/16/21 2145  ceFEPIme (MAXIPIME) 2 g in sodium chloride 0.9 % 100 mL IVPB  Status:  Discontinued        2 g 200 mL/hr over 30 Minutes Intravenous Every 24 hours 03/16/21 2133 03/19/21 1621   03/16/21 2145  vancomycin (VANCOREADY) IVPB 1500 mg/300 mL        1,500 mg 150 mL/hr over 120 Minutes Intravenous  Once 03/16/21 2133 03/17/21 0041   03/16/21 2100  azithromycin (ZITHROMAX) 500 mg in sodium chloride 0.9 % 250 mL IVPB        500 mg 250 mL/hr over 60 Minutes Intravenous  Once 03/16/21 2051 03/16/21 2213        I have personally reviewed the following labs and images: CBC: Recent Labs  Lab 03/16/21 1953 03/16/21 2035 03/16/21 2154 03/17/21 0400 03/17/21 0405 03/17/21 1212  03/18/21 0505 03/19/21 1101 03/20/21 0521  WBC 21.2*  --  10.6* 15.0*  --   --  10.6* 8.8 6.6  NEUTROABS 17.0*  --   --   --   --   --  9.2* 7.7  --   HGB 7.6*   < > 6.1* 7.2* 7.8* 8.8* 7.7* 8.3* 8.0*  HCT 26.9*   < > 21.7* 24.8* 23.0* 27.7* 24.8* 27.3* 25.8*  MCV 100.7*  --  99.5 98.0  --   --  93.6 95.1 94.2  PLT 312  --  189 202  --   --  177 194 183   < > = values in this interval not displayed.   BMP &GFR Recent Labs  Lab 03/17/21 0400 03/17/21 0405 03/18/21 0505 03/18/21 0929 03/19/21 1101 03/20/21 0521 03/21/21 0301  NA 139 142 138  --  138 135 135  K 5.0 5.0 5.2* 5.6* 4.7 4.2 4.1  CL 106  --  103  --  103 101 100  CO2 26  --  27  --  30 28 25   GLUCOSE 153*  --  117*  --  208* 170* 96  BUN 51*  --  58*  --  57* 62* 70*  CREATININE 2.67*  --  2.89*  --  2.81* 2.83* 3.12*  CALCIUM 8.7*  --  8.6*  --  8.6* 8.4* 8.5*  MG 2.3  --   --   --  2.2  --   --    Estimated Creatinine Clearance: 20.7 mL/min (A) (by C-G formula based on SCr of 3.12 mg/dL (H)). Liver & Pancreas: Recent Labs  Lab 03/16/21 1953 03/19/21 1101  AST 27 9*  ALT 20 12  ALKPHOS 75 62  BILITOT 0.8 0.8  PROT 7.0 6.9  ALBUMIN 2.9* 2.3*   No results for input(s): LIPASE, AMYLASE in the last 168 hours. No results for input(s): AMMONIA in the last 168 hours. Diabetic: No results for input(s): HGBA1C in the last 72 hours. Recent Labs  Lab 03/20/21 1104 03/20/21 1603 03/20/21 2108 03/21/21 0616 03/21/21 1122  GLUCAP 179*  155* 152* 185* 163*   Cardiac Enzymes: No results for input(s): CKTOTAL, CKMB, CKMBINDEX, TROPONINI in the last 168 hours. No results for input(s): PROBNP in the last 8760 hours. Coagulation Profile: Recent Labs  Lab 03/17/21 1212  INR 1.2   Thyroid Function Tests: No results for input(s): TSH, T4TOTAL, FREET4, T3FREE, THYROIDAB in the last 72 hours. Lipid Profile: No results for input(s): CHOL, HDL, LDLCALC, TRIG, CHOLHDL, LDLDIRECT in the last 72 hours. Anemia  Panel: No results for input(s): VITAMINB12, FOLATE, FERRITIN, TIBC, IRON, RETICCTPCT in the last 72 hours. Urine analysis:    Component Value Date/Time   COLORURINE YELLOW 03/01/2021 0029   APPEARANCEUR CLEAR 03/01/2021 0029   LABSPEC 1.011 03/01/2021 0029   PHURINE 5.0 03/01/2021 0029   GLUCOSEU NEGATIVE 03/01/2021 0029   HGBUR NEGATIVE 03/01/2021 0029   BILIRUBINUR NEGATIVE 03/01/2021 0029   KETONESUR NEGATIVE 03/01/2021 0029   PROTEINUR 30 (A) 03/01/2021 0029   UROBILINOGEN 1.0 07/19/2011 1335   NITRITE NEGATIVE 03/01/2021 0029   LEUKOCYTESUR MODERATE (A) 03/01/2021 0029   Sepsis Labs: Invalid input(s): PROCALCITONIN, [place]  Microbiology: Recent Results (from the past 240 hour(s))  Resp Panel by RT-PCR (Flu A&B, Covid) Nasopharyngeal Swab     Status: None   Collection Time: 03/16/21  7:54 PM   Specimen: Nasopharyngeal Swab; Nasopharyngeal(NP) swabs in vial transport medium  Result Value Ref Range Status   SARS Coronavirus 2 by RT PCR NEGATIVE NEGATIVE Final    Comment: (NOTE) SARS-CoV-2 target nucleic acids are NOT DETECTED.  The SARS-CoV-2 RNA is generally detectable in upper respiratory specimens during the acute phase of infection. The lowest concentration of SARS-CoV-2 viral copies this assay can detect is 138 copies/mL. A negative result does not preclude SARS-Cov-2 infection and should not be used as the sole basis for treatment or other patient management decisions. A negative result may occur with  improper specimen collection/handling, submission of specimen other than nasopharyngeal swab, presence of viral mutation(s) within the areas targeted by this assay, and inadequate number of viral copies(<138 copies/mL). A negative result must be combined with clinical observations, patient history, and epidemiological information. The expected result is Negative.  Fact Sheet for Patients:  EntrepreneurPulse.com.au  Fact Sheet for Healthcare  Providers:  IncredibleEmployment.be  This test is no t yet approved or cleared by the [place] FDA and  has been authorized for detection and/or diagnosis of SARS-CoV-2 by FDA under an Emergency Use Authorization (EUA). This EUA will remain  in effect (meaning this test can be used) for the duration of the COVID-19 declaration under Section 564(b)(1) of the Act, 21 U.S.C.section 360bbb-3(b)(1), unless the authorization is terminated  or revoked sooner.       Influenza A by PCR NEGATIVE NEGATIVE Final   Influenza B by PCR NEGATIVE NEGATIVE Final    Comment: (NOTE) The Xpert Xpress SARS-CoV-2/FLU/RSV plus assay is intended as an aid in the diagnosis of influenza from Nasopharyngeal swab specimens and should not be used as a sole basis for treatment. Nasal washings and aspirates are unacceptable for Xpert Xpress SARS-CoV-2/FLU/RSV testing.  Fact Sheet for Patients: EntrepreneurPulse.com.au  Fact Sheet for Healthcare Providers: IncredibleEmployment.be  This test is not yet approved or cleared by the [place] FDA and has been authorized for detection and/or diagnosis of SARS-CoV-2 by FDA under an Emergency Use Authorization (EUA). This EUA will remain in effect (meaning this test can be used) for the duration of the COVID-19 declaration under Section 564(b)(1) of the Act, 21 U.S.C. section 360bbb-3(b)(1), unless  the authorization is terminated or revoked.  Performed at [facility], [place] [address], [place], [place] [address]   MRSA Next Gen by PCR, Nasal     Status: None   Collection Time: 03/17/21 12:00 AM  Result Value Ref Range Status   MRSA by PCR Next Gen NOT DETECTED NOT DETECTED Final    Comment: (NOTE) The GeneXpert MRSA Assay (FDA approved for NASAL specimens only), is one component of a comprehensive MRSA colonization surveillance program. It is not intended to diagnose MRSA infection nor to  guide or monitor treatment for MRSA infections. Test performance is not FDA approved in patients less than 43 years old. Performed at [facility], [place] [address], [place],  [address]     Radiology Studies: DG Chest 1 View  Result Date: 03/20/2021 CLINICAL DATA:  Volume overload EXAM: CHEST  1 VIEW COMPARISON:  03/19/2021, CT 03/07/2021 FINDINGS: Cardiomegaly with vascular congestion. Mild perihilar ground-glass opacity overall diminished compared to the radiograph from 09/26. Trace pleural effusions. More focal consolidation in the right mid lung and medial right base are also slightly improved. Mild consolidation medial left base. Aortic atherosclerosis. No pneumothorax IMPRESSION: 1. Cardiomegaly with overall decreased bilateral ground-glass opacity/possible edema compared to prior. 2. There is residual consolidation in the right mid and lower lung and the medial left base which may reflect pneumonia Electronically Signed   By: Donavan Foil M.D.   On: 03/20/2021 19:42      Jeannemarie Sawaya T. Aldrich  If 7PM-7AM, please contact night-coverage [email or web address] 03/21/2021, 3:08 PM

## 2021-03-21 NOTE — Progress Notes (Signed)
Heart Failure Navigator Progress Note  Assessed for Heart & Vascular TOC clinic readiness.  Patient does not meet criteria due to SCr increasing to >3 today. Pt has frequent readmission for resp failure with hypoxia.   Navigator available for reassessment of patient.   Pricilla Holm, MSN, RN Heart Failure Nurse Navigator [phone]

## 2021-03-21 NOTE — Progress Notes (Signed)
Mobility Specialist Progress Note:   03/21/21 1020  Mobility  Activity Ambulated in hall  Range of Motion/Exercises Active;All extremities  Level of Assistance Standby assist, set-up cues, supervision of patient - no hands on  Assistive Device Front wheel walker  Minutes Ambulated 8 minutes  Distance Ambulated (ft) 520 ft  Mobility Ambulated with assistance in hallway  Mobility Response Tolerated well  Mobility performed by Mobility specialist  Bed Position Chair  Transport method Ambulatory  $Mobility charge 1 Mobility   SATURATION QUALIFICATIONS: (This note is used to comply with regulatory documentation for home oxygen)  Patient Saturations on [place] at Rest = N/A%  Patient Saturations on [place] while Ambulating = N/A%  Patient Saturations on 4 Liters of oxygen while Ambulating = 93%  Please briefly explain why patient needs home oxygen:  Pt tolerated ambulating on 4LO2 saturating >90%.  Pt was received sitting EOB and with encouragement, agreed to mobility. Ambulated in hall 520' with RW and supervision. Pt ambulated on 3LO2 saturating at 89-90%, but pt saturated better on 4LO2 at >90%. Denies any SOB. Pt left in chair with all needs met and MD present.   Nelta Numbers Mobility Specialist  Phone [phone]

## 2021-03-22 ENCOUNTER — Other Ambulatory Visit (HOSPITAL_COMMUNITY): Payer: Self-pay

## 2021-03-22 LAB — RENAL FUNCTION PANEL
Albumin: 2.2 g/dL — ABNORMAL LOW (ref 3.5–5.0)
Anion gap: 8 (ref 5–15)
BUN: 70 mg/dL — ABNORMAL HIGH (ref 8–23)
CO2: 25 mmol/L (ref 22–32)
Calcium: 8.4 mg/dL — ABNORMAL LOW (ref 8.9–10.3)
Chloride: 101 mmol/L (ref 98–111)
Creatinine, Ser: 3.38 mg/dL — ABNORMAL HIGH (ref 0.61–1.24)
GFR, Estimated: 19 mL/min — ABNORMAL LOW (ref >60)
Glucose, Bld: 129 mg/dL — ABNORMAL HIGH (ref 70–99)
Phosphorus: 4.4 mg/dL (ref 2.5–4.6)
Potassium: 4.4 mmol/L (ref 3.5–5.1)
Sodium: 134 mmol/L — ABNORMAL LOW (ref 135–145)

## 2021-03-22 LAB — GLUCOSE, CAPILLARY
Glucose-Capillary: 148 mg/dL — ABNORMAL HIGH (ref 70–99)
Glucose-Capillary: 206 mg/dL — ABNORMAL HIGH (ref 70–99)

## 2021-03-22 LAB — CBC
HCT: 25.7 % — ABNORMAL LOW (ref 39.0–52.0)
Hemoglobin: 7.8 g/dL — ABNORMAL LOW (ref 13.0–17.0)
MCH: 28.8 pg (ref 26.0–34.0)
MCHC: 30.4 g/dL (ref 30.0–36.0)
MCV: 94.8 fL (ref 80.0–100.0)
Platelets: 197 10*3/uL (ref 150–400)
RBC: 2.71 MIL/uL — ABNORMAL LOW (ref 4.22–5.81)
RDW: 16.4 % — ABNORMAL HIGH (ref 11.5–15.5)
WBC: 5.4 10*3/uL (ref 4.0–10.5)
nRBC: 0 % (ref 0.0–0.2)

## 2021-03-22 LAB — MAGNESIUM: Magnesium: 2.6 mg/dL — ABNORMAL HIGH (ref 1.7–2.4)

## 2021-03-22 LAB — BRAIN NATRIURETIC PEPTIDE: B Natriuretic Peptide: 909.5 pg/mL — ABNORMAL HIGH (ref 0.0–100.0)

## 2021-03-22 MED ORDER — SENNOSIDES-DOCUSATE SODIUM 8.6-50 MG PO TABS: 1.0000 | ORAL_TABLET | Freq: Two times a day (BID) | ORAL | 0 refills | Status: DC | PRN

## 2021-03-22 MED ORDER — INSULIN PEN NEEDLE 32G X 4 MM MISC
0 refills | Status: AC
  Filled 2021-03-22: qty 100, 30d supply, fill #0

## 2021-03-22 MED ORDER — FUROSEMIDE 40 MG PO TABS
40.0000 mg | ORAL_TABLET | Freq: Two times a day (BID) | ORAL | 1 refills | Status: DC
Start: 2021-03-24 — End: 2021-05-16

## 2021-03-22 MED ORDER — LANTUS SOLOSTAR 100 UNIT/ML ~~LOC~~ SOPN
10.0000 [IU] | PEN_INJECTOR | Freq: Every day | SUBCUTANEOUS | 11 refills | Status: AC
  Filled 2021-03-22: qty 3, 28d supply, fill #0

## 2021-03-22 NOTE — Discharge Summary (Signed)
Physician Discharge Summary  PASQUALINO WITHERSPOON YSA:[id number] DOB: November 28, 1950 DOA: 03/16/2021  PCP: [name], MD  Admit date: 03/16/2021 Discharge date: 03/22/2021 Admitted From: Home Disposition: Home Recommendations for Outpatient Follow-up:  Cardiology, nephrology and pulmonology to arrange outpatient follow-up. PCP follow-up as below Please obtain CBC/BMP/Mag at follow up Please follow up on the following pending results: None Home Health: [facility] RN Equipment/Devices: Patient has home oxygen Discharge Condition: Stable CODE STATUS: Full code  Follow-up Information     [name], MD Follow up on 03/26/2021.   Specialty: Internal Medicine Why: _0 :45am Contact information: [place] [address] [phone]         Skeet Latch, MD .   Specialty: Cardiology Contact information: [address] [place] [address] [phone]         Rosita Fire, MD. Schedule an appointment as soon as possible for a visit in 2 week(s).   Specialties: Nephrology, Internal Medicine Contact information: [place] [address] [phone]         [facility] Follow up.   Specialty: Home Health Services Why: [facility], they will call you to set up apt times will start on Monday Contact information: [address] [place] [address] [phone]         [facility] Follow up.   Why: please call [facility] to get to you doctors apt, they are scheduled to take you to your  pulmonary apt on Oct 5 if you need to cancel please call them to cancel . 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  He returns with dyspnea and hypoxemia to  73% on NRB, and admitted with working diagnosis of acute on chronic hypoxic respiratory failure due to diastolic CHF exacerbation, pneumonia and hypertensive emergency.  He was started on BiPAP, IV Lasix and IV antibiotics.  He was liberated off BiPAP and transferred to [facility] service.  Active pneumonia felt to be less likely, and antibiotics discontinued.    Patient had worsening renal function.  Diuretics held.  Nephrology consulted and recommended holding diuretics for 24 to 48 hours.  Nephrology to arrange outpatient follow-up.  Of note, TTE showed possible intrapulmonary shunt.  Discussed with pulmonology and cardiology and the significance of this was not clear since patient's respiratory status has improved.  Pulmonology and cardiology to arrange outpatient follow-up.  Home health nurse ordered to ensure  compliance and evaluate patient.  See individual problem list below for more on hospital course.  Discharge Diagnoses:  Acute on chronic hypoxic RF due to acute cardiogenic pulmonary edema in the setting of acute on chronic diastolic heart failure and hypertensive emergency-reportedly hypoxic to 73% with NRB.  CXR with RML and RLL opacity. TTE bubble study with LVEF of 65 to 70% and concern for intrapulmonary shunting.  BNP elevated to 1500 (slightly higher than baseline).  Improved with IV Lasix.  Heart net -7 L.   -Patient to resume home Lasix in 2 days. -Cardiology, pulmonology and nephrology to arrange outpatient follow-up -Outpatient follow-up with PCP as above -Patient was counseled on sodium and fluid restriction   Hypertensive emergency: BP improved. -Continue home Coreg, hydralazine, amlodipine and  Imdur   CKD-4/azotemia?  Creatinine continue to rise with diuretics but less than his values when he was discharged from the hospital on 9/16.  Followed by Dr. Carolin Sicks with [facility]. Recent Labs    03/07/21 0013 03/08/21 0149 03/09/21 0035 03/16/21 1953 03/16/21 2154 03/17/21 0400  03/18/21 0505 03/19/21 1101 03/20/21 0521 03/21/21 0301 03/22/21 0346  BUN 65* 68* 51* 49*  --  51* 58* 57* 62* 70* 70*  CREATININE 3.78* 3.52* 2.53* 2.64* 2.70* 2.67* 2.89* 2.81* 2.83* 3.12* 3.38*  -Patient to resume home Lasix in 2 days. -Recheck renal function at follow-up -Nephrology to arrange outpatient follow-up in 2 weeks   Hyperkalemia: Resolved.   IDDM-2 with hyperglycemia: A1c 5.8% in 7/22. Recent Labs  Lab 03/21/21 1122 03/21/21 1606 03/21/21 2107 03/22/21 0609 03/22/21 1149  GLUCAP 163* 147* 250* 148* 206*  -Decrease to home basal insulin. -Discontinued SSI   Anemia of chronic disease: H&H stable.  Recent EGD with gastritis.  Patient denies melena or hematochezia. Recent Labs    03/16/21 1953 03/16/21 2035 03/16/21 2154 03/17/21 0400 03/17/21 0405 03/17/21 1212 03/18/21 0505 03/19/21 1101 03/20/21 0521 03/22/21 0346  HGB 7.6* 7.1* 6.1* 7.2* 7.8* 8.8* 7.7* 8.3* 8.0* 7.8*  -Recheck CBC at follow-up   Gastritis: EGD 09/10 with gastritis.  Pathology with marked nonspecific reactive gastropathy  and chronic gastritis.  H. pylori negative. -Continue Protonix   OSA:  -Nightly CPAP  Body mass index is 24.43 kg/m.           Discharge Exam: Vitals:   03/21/21 2040 03/22/21 0011 03/22/21 0505 03/22/21 0746  BP: (!) 127/57  (!) 130/50 (!) 158/73  Pulse: 63  67 69  Temp: 97.9 F (36.6 C)  98.4 F (36.9 C) 98.4 F (36.9 C)  Resp: 20  20   Weight:  72.9 kg Comment: scale b    SpO2: 100%  97% 100%  TempSrc: Oral  Oral Oral  BMI (Calculated):  24.44       GENERAL: No apparent distress.  Nontoxic. HEENT: MMM.  Vision and hearing grossly intact.  NECK: Supple.  No apparent JVD.  RESP: 100% on 4 L.  No IWOB.  Fair aeration bilaterally. CVS:  RRR. Heart sounds normal.  ABD/GI/GU: Bowel sounds present. Soft. Non tender.  MSK/EXT:  Moves extremities. No apparent deformity. No edema.  SKIN: no apparent skin lesion or wound NEURO: Awake and alert.   Oriented appropriately.  No apparent focal neuro deficit. PSYCH: Calm. Normal affect.   Discharge Instructions  Discharge Instructions     (HEART FAILURE PATIENTS) Call MD:  Anytime you have any of the following symptoms: 1) 3 pound weight gain in 24 hours or 5 pounds in 1 week 2) shortness of breath, with or without a dry hacking cough 3) swelling in the hands, feet or stomach 4) if you have to sleep on extra pillows at night in order to breathe.   Complete by: As directed    Call MD for:  difficulty breathing, headache or visual disturbances   Complete by: As directed    Call MD for:  persistant dizziness or light-headedness   Complete by: As directed    Diet - low sodium heart healthy   Complete by: As directed    Diet Carb Modified   Complete by: As directed    Discharge instructions   Complete by: As directed    It has been a pleasure taking care of you!  You were hospitalized due to difficulty breathing from  heart failure exacerbation.  Your symptoms improved with treatment.  You may restart your fluid medication in 2 days.  Please review your new medication list and the directions on your medications before you take them.  Someone will give you a call from the the cardiologist, pulmonologist and nephrologist office in the next 1 to 2 weeks to arrange outpatient follow-up as well.  In addition to taking your medications as prescribed, we also recommend you avoid alcohol or over-the-counter pain medication other than plain Tylenol, limit the amount of water/fluid you drink to less than 6 cups (1500 cc) a day,  limit your sodium (salt) intake to less than 2 g (2000 mg) a day and weigh yourself daily at the same time and keeping your weight log.     Take care,   Increase activity slowly   Complete by: As directed       Allergies as of 03/22/2021       Reactions   Aspirin Itching   Patient still takes a baby ASA everyday with no issues  03/17/21 not allergic         Medication List     STOP taking these medications    Insulin Glargine 300 UNIT/ML Sopn Replaced by: Lantus SoloStar 100 UNIT/ML Solostar Pen   NovoLOG ReliOn 100 UNIT/ML injection Generic drug: insulin aspart       TAKE these medications    amLODipine 10 MG tablet Commonly known as: NORVASC Take 1 tablet (10 mg total) by mouth daily.   aspirin EC 81 MG tablet Take 1 tablet (81 mg total) by mouth daily. Swallow whole.   atorvastatin 20 MG tablet Commonly known as: LIPITOR Take 1 tablet (20 mg total) by mouth daily.   brimonidine 0.2 % ophthalmic solution Commonly known as: ALPHAGAN Place 1 drop into the right eye 2 (two) times daily.   carvedilol 12.5 MG tablet Commonly known as: COREG Take 1 tablet (12.5 mg total) by mouth 2 (two) times daily.   cholecalciferol 25 MCG (1000 UNIT) tablet Commonly known as: VITAMIN D3 Take 1,000 Units by mouth daily.   Dexcom G6 Sensor Misc 1 Device by Does not apply route as directed.   Dexcom G6 Transmitter Misc 1 Device by Does not apply route as directed.   diclofenac Sodium 1 % Gel Commonly known as: VOLTAREN Apply 4 g topically 4 (four) times daily as needed for pain.   docusate sodium 100 MG capsule Commonly known as: COLACE Take 2 capsules (200 mg total) by mouth 2 (two) times daily.   dorzolamide 2 % ophthalmic solution Commonly known as: TRUSOPT Place 1 drop into both eyes 2 (two) times daily.   doxazosin 2 MG tablet Commonly known as: CARDURA Take 2 mg by mouth daily. Patient not sure if still taking   furosemide 40 MG tablet Commonly known as: LASIX Take 1 tablet (40 mg total) by mouth 2 (two) times daily. Start taking on: March 24, 2021 What changed: These instructions start on March 24, 2021. If you are unsure what to do until then, ask your doctor or other care provider.   hydrALAZINE 100 MG tablet Commonly known as: APRESOLINE Take 1 tablet (100 mg total) by mouth 3 (three) times daily.    isosorbide mononitrate 60 MG 24 hr tablet Commonly known as: IMDUR Take 60 mg by mouth daily.   Lantus SoloStar 100 UNIT/ML Solostar Pen Generic drug: insulin glargine Inject 10 Units into the skin daily. Replaces: Insulin Glargine 300 UNIT/ML Sopn  latanoprost 0.005 % ophthalmic solution Commonly known as: XALATAN Place 1 drop into the left eye at bedtime.   nitroGLYCERIN 0.4 MG SL tablet Commonly known as: NITROSTAT DISSOLVE ONE TABLET UNDER THE TONGUE EVERY 5 MINUTES AS NEEDED FOR CHEST PAIN. What changed: See the new instructions.   OneTouch Verio test strip Generic drug: glucose blood USE 1 STRIP TO CHECK GLUCOSE THREE TIMES DAILY AS DIRECTED   pantoprazole 40 MG tablet Commonly known as: Protonix Take 1 tablet (40 mg total) by mouth 2 (two) times daily before a meal.   Pentips 32G X 4 MM Misc Generic drug: Insulin Pen Needle Use as directed with insulin   polyethylene glycol 17 g packet Commonly known as: MiraLax Take 17 g by mouth daily.   senna-docusate 8.6-50 MG tablet Commonly known as: Senokot-S Take 1 tablet by mouth 2 (two) times daily between meals as needed for mild constipation.        Consultations: Pulmonology Cardiology Nephrology  Procedures/Studies:   DG Chest 1 View  Result Date: 03/20/2021 CLINICAL DATA:  Volume overload EXAM: CHEST  1 VIEW COMPARISON:  03/19/2021, CT 03/07/2021 FINDINGS: Cardiomegaly with vascular congestion. Mild perihilar ground-glass opacity overall diminished compared to the radiograph from 09/26. Trace pleural effusions. More focal consolidation in the right mid lung and medial right base are also slightly improved. Mild consolidation medial left base. Aortic atherosclerosis. No pneumothorax IMPRESSION: 1. Cardiomegaly with overall decreased bilateral ground-glass opacity/possible edema compared to prior. 2. There is residual consolidation in the right mid and lower lung and the medial left base which may reflect  pneumonia Electronically Signed   By: Donavan Foil M.D.   On: 03/20/2021 19:42   DG Chest 2 View  Result Date: 02/28/2021 CLINICAL DATA:  Suspected sepsis. EXAM: CHEST - 2 VIEW COMPARISON:  Chest x-ray 02/12/2021. CT chest 01/30/2021. FINDINGS: Heart is enlarged, unchanged. The aorta is tortuous and contains atherosclerotic calcifications. Right mid lung airspace disease is again noted similar to the prior examination. There are stable small bilateral pleural effusions. There are increased central interstitial markings bilaterally. There is no evidence for pneumothorax. No acute fractures are seen. Degenerative changes affect the spine. IMPRESSION: 1. Stable right mid lung airspace disease worrisome for pneumonia. 2. Stable small bilateral pleural effusions. 3. Cardiomegaly with central pulmonary vascular congestion. Electronically Signed   By: Ronney Asters M.D.   On: 02/28/2021 21:16   CT CHEST WO CONTRAST  Result Date: 03/07/2021 CLINICAL DATA:  Pneumonia follow-up. EXAM: CT CHEST WITHOUT CONTRAST TECHNIQUE: Multidetector CT imaging of the chest was performed following the standard protocol without IV contrast. COMPARISON:  CT chest dated January 30, 2021. FINDINGS: Cardiovascular: Stable cardiomegaly. No pericardial effusion. No thoracic aortic aneurysm. Coronary, aortic arch, and branch vessel atherosclerotic vascular disease. Mediastinum/Nodes: No enlarged mediastinal or axillary lymph nodes. Unchanged 1.6 cm hypodense nodule in the left thyroid lobe. The trachea and esophagus demonstrate no significant findings. Lungs/Pleura: Unchanged small right greater than left pleural effusions. Resolved interlobular septal thickening and layering ground-glass densities in both lungs. Significantly improved consolidation in the right upper lobe with some persistent airspace disease posteriorly. Mildly worsened atelectasis in the right greater than left lower lobes. Unchanged no pneumothorax. Upper Abdomen: No acute  abnormality. Unchanged symmetric bilateral adrenal gland thickening. Musculoskeletal: No acute or significant osseous findings. IMPRESSION: 1. Significantly improved consolidation in the right upper lobe with some persistent airspace disease posteriorly, consistent with resolving pneumonia. Continued follow-up to resolution is recommended. 2. Resolved pulmonary edema. Unchanged small right  greater than left pleural effusions. 3. Mildly worsened atelectasis in the right greater than left lower lobes. 4. Unchanged 1.6 cm hypodense nodule in the left thyroid lobe. Recommend thyroid US (ref: J Am Coll Radiol. 2015 Feb;12(2): 143-50). 5. Aortic Atherosclerosis (ICD10-I70.0). Electronically Signed   By: Titus Dubin M.D.   On: 03/07/2021 15:01   DG CHEST PORT 1 VIEW  Result Date: 03/19/2021 CLINICAL DATA:  Dyspnea. EXAM: PORTABLE CHEST 1 VIEW COMPARISON:  03/17/2021 FINDINGS: AP view of the chest again demonstrates airspace disease and consolidation in the mid right lung. There appears to be a small amount of pleural fluid particularly on the right side. Hazy densities in the left lung may represent a small amount of left lung airspace disease. Heart size is enlarged but stable. Atherosclerotic calcifications at the aortic arch. Trachea is midline. Negative for a pneumothorax. IMPRESSION: 1. No significant change in the airspace disease and consolidation in the right lung. Suspect a small amount of pleural fluid. 2. Probable mild airspace disease in left lung. Electronically Signed   By: Markus Daft M.D.   On: 03/19/2021 08:54   DG Chest Port 1 View  Result Date: 03/17/2021 CLINICAL DATA:  Hypoxic respiratory failure. EXAM: PORTABLE CHEST 1 VIEW COMPARISON:  03/16/2021 FINDINGS: Normal scratch set stable cardiomediastinal contours. No pleural effusion identified. There is diffuse pulmonary vascular congestion. Dense airspace consolidation within the right upper lobe is unchanged. Mild patchy airspace densities  are noted within the right lower lobe, left midlung and left base, also unchanged. IMPRESSION: 1. No change in aeration to the lungs compared with prior exam. 2. Stable pulmonary vascular congestion. Electronically Signed   By: Kerby Moors M.D.   On: 03/17/2021 08:10   DG Chest Port 1 View  Result Date: 03/16/2021 CLINICAL DATA:  Dyspnea. EXAM: PORTABLE CHEST 1 VIEW COMPARISON:  March 05, 2021 FINDINGS: Marked severity infiltrate is seen within the mid right lung with moderate severity infiltrate noted along the right lung base. This represents a new finding when compared to the prior study. There is no evidence of a pleural effusion or pneumothorax. The heart size and mediastinal contours are within normal limits. Marked severity calcification of the thoracic aorta is seen. Multilevel degenerative changes are noted throughout the thoracic spine. IMPRESSION: Moderate to marked severity infiltrates within the mid right lung and right lung base. Electronically Signed   By: Virgina Norfolk M.D.   On: 03/16/2021 20:57   DG CHEST PORT 1 VIEW  Result Date: 03/05/2021 CLINICAL DATA:  Post bronchoscopy EXAM: PORTABLE CHEST 1 VIEW COMPARISON:  02/28/2021 FINDINGS: The lungs are symmetrically well expanded. Airspace infiltrate within the right mid lung zone appears improved since prior examination, particularly when compared to remote prior examination of 01/30/2021. No pneumothorax or pleural effusion. Mild cardiomegaly is stable. Central pulmonary arteries are enlarged in keeping with changes of pulmonary arterial hypertension. No acute bone abnormality. IMPRESSION: No pneumothorax following bronchoscopy. Improving right mid lung zone pulmonary infiltrate. Stable cardiomegaly and enlargement of the central pulmonary arteries. Electronically Signed   By: Fidela Salisbury M.D.   On: 03/05/2021 16:34   DG Abd Portable 1V  Result Date: 03/07/2021 CLINICAL DATA:  Constipation, abdominal discomfort EXAM:  PORTABLE ABDOMEN - 1 VIEW COMPARISON:  None. FINDINGS: There is a nonobstructive bowel gas pattern. There is a large stool burden throughout the colon. There is no gross organomegaly or abnormal soft tissue calcification. The heart is enlarged. The lung bases are clear. There is degenerative change in the  lower lumbar spine. IMPRESSION: Large colonic stool burden without evidence of mechanical obstruction. Electronically Signed   By: Valetta Mole M.D.   On: 03/07/2021 08:33   DG Swallowing Func-Speech Pathology  Result Date: 03/19/2021 Table formatting from the original result was not included. Objective Swallowing Evaluation: Type of Study: MBS-Modified Barium Swallow Study  Patient Details Name: DVONTE GATLIFF MRN: [id number] Date of Birth: [birth year] Today's Date: 03/19/2021 Time: SLP Start Time (ACUTE ONLY): 1350 -SLP Stop Time (ACUTE ONLY): 1407 SLP Time Calculation (min) (ACUTE ONLY): 17 min Past Medical History: Past Medical History: Diagnosis Date  Altered mental status   a. 05/2017 - adm with blurred vision, somnolence in setting of AKI and high blood sugar.  Anemia   CKD (chronic kidney disease), stage III (HCC)   Diabetes mellitus   Diastolic CHF, chronic ([place])   A.  03/2009 Echo: EF 60-65%, Gr II diast dysfxn  Elevated troponin   a. 2013 - troponin 1.4. b. 2019 - troponin 0.32; neg nuc 07/2017.  Glaucoma   Hyperlipidemia   HYPERCHOLESTEROLEMIA  Hypertension   MARKED LEFT VENTRICULAR HYPERTROPHY BY PREVIOUS ECHOCARDIOGRAM--HE HAS HYPERDYNAMIC LEFT VENTRICULAR SYSTOLIC FUNCTION AND HAS IMPAIRED RELAXATION BY ECHO  Hypertrophic cardiomyopathy (HCC)   NSVT (nonsustained ventricular tachycardia) (HCC)   Premature atrial contractions   PVC's (premature ventricular contractions)   SVT (supraventricular tachycardia) ([place])  Past Surgical History: Past Surgical History: Procedure Laterality Date  BIOPSY  03/03/2021  Procedure: BIOPSY;  Surgeon: Jackquline Denmark, MD;  Location: [place];  Service: Endoscopy;;   BRONCHIAL BIOPSY  03/05/2021  Procedure: BRONCHIAL BIOPSIES;  Surgeon: Candee Furbish, MD;  Location[facility] ENDOSCOPY;  Service: Pulmonary;;  BRONCHIAL BRUSHINGS  03/05/2021  Procedure: BRONCHIAL BRUSHINGS;  Surgeon: Candee Furbish, MD;  Location: [facility] ENDOSCOPY;  Service: Pulmonary;;  BRONCHIAL WASHINGS  03/05/2021  Procedure: BRONCHIAL WASHINGS;  Surgeon: Candee Furbish, MD;  Location: [facility] ENDOSCOPY;  Service: Pulmonary;;  ESOPHAGOGASTRODUODENOSCOPY (EGD) WITH PROPOFOL N/A 03/03/2021  Procedure: ESOPHAGOGASTRODUODENOSCOPY (EGD) WITH PROPOFOL;  Surgeon: Jackquline Denmark, MD;  Location: [facility] ENDOSCOPY;  Service: Endoscopy;  Laterality: N/A;  HEMOSTASIS CONTROL  03/05/2021  Procedure: HEMOSTASIS CONTROL;  Surgeon: Candee Furbish, MD;  Location: [facility] ENDOSCOPY;  Service: Pulmonary;;  NO PAST SURGERIES    VIDEO BRONCHOSCOPY N/A 03/05/2021  Procedure: VIDEO BRONCHOSCOPY WITH FLUORO;  Surgeon: Candee Furbish, MD;  Location: [facility] ENDOSCOPY;  Service: Pulmonary;  Laterality: N/A; HPI: This 70 y.o. African American male nonsmoker presented to the [facility] Emergency Department via EMS with complaints of respiratory distress.  Per EMS, the patient was found to be hypoxic.  SpO2 73% despite non-rebreather mask. He was reportedly placed on CPAP and subsequently became unresponsive.  He was supported with bag-mask ventilation and transported to the ER.  In the ER, the patient has been placed on BiPAP.Of note, the patient was recently hospitalized for pneumonia and was discharged on 9/17 and was treated with cefepime/vancomycin which was switched to Unasyn.  He underwent bronch/BAL/TBBx/brushings 03/05/21. All unrevealing but no BAL cell count/diff obtained. MD concerned about potential aspiration as patient has had recurrent PNA's recently.  Subjective: pleasant, sitting EOB eating dinner Assessment / Plan / Recommendation CHL IP CLINICAL IMPRESSIONS 03/19/2021 Clinical Impression Pt presents with pharyngeal dysphagia  characterized by reduced anterior laryngeal movement and a pharyngeal delay which resulted in penetration (PAS 3, 5) of thin and nectar thick liquids. Prompted coughing was ineffective in expelling penetrate. Various postural modifications were attempted and a chin tuck posture with  right head turn proved most effective eliminating laryngeal invasion. Mild pyriform sinus residue was noted with liquids and this was cleared with pt's independent use of secondary swallows. Pt's pharyngeal timing and clearance were adequate for solids. A regular texture diet with thin liquids is recommended at this time with observance of swallowing precautions to reduce aspiration risk. SLP will follow for treatment. SLP Visit Diagnosis Dysphagia, pharyngeal phase (R13.13) Attention and concentration deficit following -- Frontal lobe and executive function deficit following -- Impact on safety and function Mild aspiration risk   CHL IP TREATMENT RECOMMENDATION 03/19/2021 Treatment Recommendations Therapy as outlined in treatment plan below   Prognosis 03/19/2021 Prognosis for Safe Diet Advancement Good Barriers to Reach Goals -- Barriers/Prognosis Comment -- CHL IP DIET RECOMMENDATION 03/19/2021 SLP Diet Recommendations Regular solids;Nectar thick liquid Liquid Administration via Cup;No straw Medication Administration Whole meds with liquid Compensations Slow rate;Small sips/bites;Chin tuck with right head turn, secondary swallows Postural Changes Seated upright at 90 degrees   CHL IP OTHER RECOMMENDATIONS 03/19/2021 Recommended Consults -- Oral Care Recommendations Oral care BID Other Recommendations --   CHL IP FOLLOW UP RECOMMENDATIONS 03/17/2021 Follow up Recommendations None   CHL IP FREQUENCY AND DURATION 03/19/2021 Speech Therapy Frequency (ACUTE ONLY) min 2x/week Treatment Duration 2 weeks      CHL IP ORAL PHASE 03/19/2021 Oral Phase WFL Oral - Pudding Teaspoon -- Oral - Pudding Cup -- Oral - Honey Teaspoon -- Oral - Honey Cup -- Oral  - Nectar Teaspoon -- Oral - Nectar Cup -- Oral - Nectar Straw -- Oral - Thin Teaspoon -- Oral - Thin Cup -- Oral - Thin Straw -- Oral - Puree -- Oral - Mech Soft -- Oral - Regular -- Oral - Multi-Consistency -- Oral - Pill -- Oral Phase - Comment --  CHL IP PHARYNGEAL PHASE 03/19/2021 Pharyngeal Phase Impaired Pharyngeal- Pudding Teaspoon -- Pharyngeal -- Pharyngeal- Pudding Cup -- Pharyngeal -- Pharyngeal- Honey Teaspoon -- Pharyngeal -- Pharyngeal- Honey Cup -- Pharyngeal -- Pharyngeal- Nectar Teaspoon -- Pharyngeal -- Pharyngeal- Nectar Cup Delayed swallow initiation-vallecula;Delayed swallow initiation-pyriform sinuses;Penetration/Aspiration during swallow;Reduced anterior laryngeal mobility;Pharyngeal residue - pyriform Pharyngeal Material enters airway, CONTACTS cords and not ejected out Pharyngeal- Nectar Straw -- Pharyngeal -- Pharyngeal- Thin Teaspoon -- Pharyngeal -- Pharyngeal- Thin Cup Delayed swallow initiation-vallecula;Delayed swallow initiation-pyriform sinuses;Penetration/Aspiration during swallow;Reduced anterior laryngeal mobility;Pharyngeal residue - pyriform Pharyngeal Material enters airway, remains ABOVE vocal cords and not ejected out;Material enters airway, CONTACTS cords and not ejected out Pharyngeal- Thin Straw -- Pharyngeal -- Pharyngeal- Puree Reduced anterior laryngeal mobility Pharyngeal -- Pharyngeal- Mechanical Soft -- Pharyngeal -- Pharyngeal- Regular Reduced anterior laryngeal mobility Pharyngeal -- Pharyngeal- Multi-consistency -- Pharyngeal -- Pharyngeal- Pill Reduced anterior laryngeal mobility Pharyngeal -- Pharyngeal Comment --  CHL IP CERVICAL ESOPHAGEAL PHASE 03/19/2021 Cervical Esophageal Phase WFL Pudding Teaspoon -- Pudding Cup -- Honey Teaspoon -- Honey Cup -- Nectar Teaspoon -- Nectar Cup -- Nectar Straw -- Thin Teaspoon -- Thin Cup -- Thin Straw -- Puree -- Mechanical Soft -- Regular -- Multi-consistency -- Pill -- Cervical Esophageal Comment -- Shanika I. Hardin Negus, [place],  [place] Office number [phone] Pager [phone] Horton Marshall 03/19/2021, 3:49 PM              ECHOCARDIOGRAM LIMITED BUBBLE STUDY  Result Date: 03/19/2021    ECHOCARDIOGRAM LIMITED REPORT   Patient Name:   AVAN GULLETT Date of Exam: 03/19/2021 Medical Rec #:  [id number]     Height:       68.0 in  Accession #:    [id number]    Weight:       165.1 lb Date of Birth:  1950/08/10      BSA:          1.884 m Patient Age:    70 years      BP:           198/81 mmHg Patient Gender: M             HR:           69 bpm. Exam Location:  Inpatient Procedure: Limited Echo Indications:    Hypoxia [300808]  History:        Patient has prior history of Echocardiogram examinations, most                 recent 01/30/2021. CHF and Hypertrophic Cardiomyopathy; Risk                 Factors:Diabetes, Dyslipidemia and Hypertension.  Sonographer:    Bernadene Person RDCS Referring Phys: [id number]  1. Limited echo for shunting in the setting of hypoxia  2. Agitated saline contrast bubble study was positive with shunting observed after >6 cardiac cycles suggestive of intrapulmonary shunting. A moderate number of microbubbles were noted, consistent with a more significant shunt.  3. Left ventricular ejection fraction, by estimation, is 65 to 70%. The left ventricle has normal function. Comparison(s): Changes from prior study are noted. 01/30/2021: LVEF 70-75%. FINDINGS  Left Ventricle: Left ventricular ejection fraction, by estimation, is 65 to 70%. The left ventricle has normal function. IAS/Shunts: No atrial level shunt detected by color flow Doppler. Agitated saline contrast was given intravenously to evaluate for intracardiac shunting. Agitated saline contrast bubble study was positive with shunting observed after >6 cardiac cycles suggestive of intrapulmonary shunting. Lyman Bishop MD Electronically signed by Lyman Bishop MD Signature Date/Time: 03/19/2021/3:31:33 PM    Final     VAS [place] LOWER EXTREMITY VENOUS (DVT)  Result Date: 03/17/2021  Lower Venous DVT Study Patient Name:  CHARLIE SEDA  Date of Exam:   03/17/2021 Medical Rec #: [id number]      Accession #:    [id number] Date of Birth: Jun 14, 1951       Patient Gender: M Patient Age:   19 years Exam Location:  [facility] Procedure:      VAS [place] LOWER EXTREMITY VENOUS (DVT) Referring Phys: Renee Pain --------------------------------------------------------------------------------  Indications: Edema.  Limitations: Acoustic shadowing due to overlying calcification. Comparison Study: 01-14-2020 Bilateral lower extremity venous study was negative                   for DVT.                    01-08-2021 ABI w/ TBI showed bilateral absent DPA, moderate                   right arterial disease, mild left arterial disease. Performing Technologist: Darlin Coco RDMS, RVT  Examination Guidelines: A complete evaluation includes B-mode imaging, spectral Doppler, color Doppler, and power Doppler as needed of all accessible portions of each vessel. Bilateral testing is considered an integral part of a complete examination. Limited examinations for reoccurring indications may be performed as noted. The reflux portion of the exam is performed with the patient in reverse Trendelenburg.  +---------+---------------+---------+-----------+----------+--------------+ RIGHT    CompressibilityPhasicitySpontaneityPropertiesThrombus Aging +---------+---------------+---------+-----------+----------+--------------+ CFV      Full  Yes      Yes                                 +---------+---------------+---------+-----------+----------+--------------+ SFJ      Full                                                        +---------+---------------+---------+-----------+----------+--------------+ FV Prox  Full                                                         +---------+---------------+---------+-----------+----------+--------------+ FV Mid   Full                                                        +---------+---------------+---------+-----------+----------+--------------+ FV DistalFull                                                        +---------+---------------+---------+-----------+----------+--------------+ PFV      Full                                                        +---------+---------------+---------+-----------+----------+--------------+ POP      Full           Yes      Yes                                 +---------+---------------+---------+-----------+----------+--------------+ PTV      Full                                                        +---------+---------------+---------+-----------+----------+--------------+ PERO     Full                                                        +---------+---------------+---------+-----------+----------+--------------+ Gastroc  Full                                                        +---------+---------------+---------+-----------+----------+--------------+   +---------+---------------+---------+-----------+----------+--------------+ LEFT     CompressibilityPhasicitySpontaneityPropertiesThrombus Aging +---------+---------------+---------+-----------+----------+--------------+ CFV  Full           Yes      Yes                                 +---------+---------------+---------+-----------+----------+--------------+ SFJ      Full                                                        +---------+---------------+---------+-----------+----------+--------------+ FV Prox  Full                                                        +---------+---------------+---------+-----------+----------+--------------+ FV Mid   Full                                                         +---------+---------------+---------+-----------+----------+--------------+ FV DistalFull                                                        +---------+---------------+---------+-----------+----------+--------------+ PFV      Full                                                        +---------+---------------+---------+-----------+----------+--------------+ POP      Full           Yes      Yes                                 +---------+---------------+---------+-----------+----------+--------------+ PTV      Full                                                        +---------+---------------+---------+-----------+----------+--------------+ PERO     Full                                                        +---------+---------------+---------+-----------+----------+--------------+ Gastroc  Full                                                        +---------+---------------+---------+-----------+----------+--------------+  Summary: RIGHT: - There is no evidence of deep vein thrombosis in the lower extremity.  - No cystic structure found in the popliteal fossa.  LEFT: - There is no evidence of deep vein thrombosis in the lower extremity.  - No cystic structure found in the popliteal fossa.  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 *See table(s) above for measurements and observations. Electronically signed by Servando Snare MD on 03/17/2021 at 5:23:46 PM.    Final    DG C-ARM BRONCHOSCOPY  Result Date: 03/05/2021 C-ARM BRONCHOSCOPY: Fluoroscopy was utilized by the requesting physician.  No radiographic interpretation.       The results of significant diagnostics from this hospitalization (including imaging, microbiology, ancillary and laboratory) are listed below for reference.     Microbiology: Recent Results (from the past 240 hour(s))  Resp Panel by RT-PCR (Flu A&B, Covid) Nasopharyngeal Swab     Status: None   Collection  Time: 03/16/21  7:54 PM   Specimen: Nasopharyngeal Swab; Nasopharyngeal(NP) swabs in vial transport medium  Result Value Ref Range Status   SARS Coronavirus 2 by RT PCR NEGATIVE NEGATIVE Final    Comment: (NOTE) SARS-CoV-2 target nucleic acids are NOT DETECTED.  The SARS-CoV-2 RNA is generally detectable in upper respiratory specimens during the acute phase of infection. The lowest concentration of SARS-CoV-2 viral copies this assay can detect is 138 copies/mL. A negative result does not preclude SARS-Cov-2 infection and should not be used as the sole basis for treatment or other patient management decisions. A negative result may occur with  improper specimen collection/handling, submission of specimen other than nasopharyngeal swab, presence of viral mutation(s) within the areas targeted by this assay, and inadequate number of viral copies(<138 copies/mL). A negative result must be combined with clinical observations, patient history, and epidemiological information. The expected result is Negative.  Fact Sheet for Patients:  EntrepreneurPulse.com.au  Fact Sheet for Healthcare Providers:  IncredibleEmployment.be  This test is no t yet approved or cleared by the [place] FDA and  has been authorized for detection and/or diagnosis of SARS-CoV-2 by FDA under an Emergency Use Authorization (EUA). This EUA will remain  in effect (meaning this test can be used) for the duration of the COVID-19 declaration under Section 564(b)(1) of the Act, 21 U.S.C.section 360bbb-3(b)(1), unless the authorization is terminated  or revoked sooner.       Influenza A by PCR NEGATIVE NEGATIVE Final   Influenza B by PCR NEGATIVE NEGATIVE Final    Comment: (NOTE) The Xpert Xpress SARS-CoV-2/FLU/RSV plus assay is intended as an aid in the diagnosis of influenza from Nasopharyngeal swab specimens and should not be used as a sole basis for treatment. Nasal washings  and aspirates are unacceptable for Xpert Xpress SARS-CoV-2/FLU/RSV testing.  Fact Sheet for Patients: EntrepreneurPulse.com.au  Fact Sheet for Healthcare Providers: IncredibleEmployment.be  This test is not yet approved or cleared by the [place] FDA and has been authorized for detection and/or diagnosis of SARS-CoV-2 by FDA under an Emergency Use Authorization (EUA). This EUA will remain in effect (meaning this test can be used) for the duration of the COVID-19 declaration under Section 564(b)(1) of the Act, 21 U.S.C. section 360bbb-3(b)(1), unless the authorization is terminated or revoked.  Performed at [facility], [place] [address] [place],  [address]   MRSA Next Gen by PCR, Nasal     Status: None   Collection Time: 03/17/21 12:00 AM  Result Value Ref Range Status   MRSA by PCR Next Gen  NOT DETECTED NOT DETECTED Final    Comment: (NOTE) The GeneXpert MRSA Assay (FDA approved for NASAL specimens only), is one component of a comprehensive MRSA colonization surveillance program. It is not intended to diagnose MRSA infection nor to guide or monitor treatment for MRSA infections. Test performance is not FDA approved in patients less than 44 years old. Performed at [facility], [place] [address] [place], [place] [address]      Labs:  CBC: Recent Labs  Lab 03/16/21 1953 03/16/21 2035 03/17/21 0400 03/17/21 0405 03/17/21 1212 03/18/21 0505 03/19/21 1101 03/20/21 0521 03/22/21 0346  WBC 21.2*   < > 15.0*  --   --  10.6* 8.8 6.6 5.4  NEUTROABS 17.0*  --   --   --   --  9.2* 7.7  --   --   HGB 7.6*   < > 7.2*   < > 8.8* 7.7* 8.3* 8.0* 7.8*  HCT 26.9*   < > 24.8*   < > 27.7* 24.8* 27.3* 25.8* 25.7*  MCV 100.7*   < > 98.0  --   --  93.6 95.1 94.2 94.8  PLT 312   < > 202  --   --  177 194 183 197   < > = values in this interval not displayed.   BMP &GFR Recent Labs  Lab 03/17/21 0400 03/17/21 0405  03/18/21 0505 03/18/21 0929 03/19/21 1101 03/20/21 0521 03/21/21 0301 03/22/21 0346  NA 139   < > 138  --  138 135 135 134*  K 5.0   < > 5.2* 5.6* 4.7 4.2 4.1 4.4  CL 106  --  103  --  103 101 100 101  CO2 26  --  27  --  _0 GLUCOSE 153*  --  117*  --  208* 170* 96 129*  BUN 51*  --  58*  --  57* 62* 70* 70*  CREATININE 2.67*  --  2.89*  --  2.81* 2.83* 3.12* 3.38*  CALCIUM 8.7*  --  8.6*  --  8.6* 8.4* 8.5* 8.4*  MG 2.3  --   --   --  2.2  --   --  2.6*  PHOS  --   --   --   --   --   --   --  4.4   < > = values in this interval not displayed.   Estimated Creatinine Clearance: 19.1 mL/min (A) (by C-G formula based on SCr of 3.38 mg/dL (H)). Liver & Pancreas: Recent Labs  Lab 03/16/21 1953 03/19/21 1101 03/22/21 0346  AST 27 9*  --   ALT 20 12  --   ALKPHOS 75 62  --   BILITOT 0.8 0.8  --   PROT 7.0 6.9  --   ALBUMIN 2.9* 2.3* 2.2*   No results for input(s): LIPASE, AMYLASE in the last 168 hours. No results for input(s): AMMONIA in the last 168 hours. Diabetic: No results for input(s): HGBA1C in the last 72 hours. Recent Labs  Lab 03/21/21 1122 03/21/21 1606 03/21/21 2107 03/22/21 0609 03/22/21 1149  GLUCAP 163* 147* 250* 148* 206*   Cardiac Enzymes: No results for input(s): CKTOTAL, CKMB, CKMBINDEX, TROPONINI in the last 168 hours. No results for input(s): PROBNP in the last 8760 hours. Coagulation Profile: Recent Labs  Lab 03/17/21 1212  INR 1.2   Thyroid Function Tests: No results for input(s): TSH, T4TOTAL, FREET4, T3FREE, THYROIDAB in the last 72 hours.  Lipid Profile: No results for input(s): CHOL, HDL, LDLCALC, TRIG, CHOLHDL, LDLDIRECT in the last 72 hours. Anemia Panel: No results for input(s): VITAMINB12, FOLATE, FERRITIN, TIBC, IRON, RETICCTPCT in the last 72 hours. Urine analysis:    Component Value Date/Time   COLORURINE YELLOW 03/01/2021 0029   APPEARANCEUR CLEAR 03/01/2021 0029   LABSPEC 1.011 03/01/2021 0029   PHURINE 5.0  03/01/2021 0029   GLUCOSEU NEGATIVE 03/01/2021 0029   HGBUR NEGATIVE 03/01/2021 0029   BILIRUBINUR NEGATIVE 03/01/2021 0029   KETONESUR NEGATIVE 03/01/2021 0029   PROTEINUR 30 (A) 03/01/2021 0029   UROBILINOGEN 1.0 07/19/2011 1335   NITRITE NEGATIVE 03/01/2021 0029   LEUKOCYTESUR MODERATE (A) 03/01/2021 0029   Sepsis Labs: Invalid input(s): PROCALCITONIN, LACTICIDVEN   Time coordinating discharge: 55 minutes  SIGNED:  Mercy Riding, MD  [facility] 03/22/2021, 6:56 PM

## 2021-03-22 NOTE — Plan of Care (Signed)
  Problem: Education: Goal: Knowledge of General Education information will improve Description: Including pain rating scale, medication(s)/side effects and non-pharmacologic comfort measures Outcome: Adequate for Discharge   Problem: Health Behavior/Discharge Planning: Goal: Ability to manage health-related needs will improve Outcome: Adequate for Discharge   Problem: Clinical Measurements: Goal: Ability to maintain clinical measurements within normal limits will improve Outcome: Adequate for Discharge Goal: Will remain free from infection Outcome: Adequate for Discharge Goal: Diagnostic test results will improve Outcome: Adequate for Discharge Goal: Respiratory complications will improve Outcome: Adequate for Discharge Goal: Cardiovascular complication will be avoided Outcome: Adequate for Discharge   Problem: Activity: Goal: Risk for activity intolerance will decrease Outcome: Adequate for Discharge   Problem: Nutrition: Goal: Adequate nutrition will be maintained Outcome: Adequate for Discharge   Problem: Coping: Goal: Level of anxiety will decrease Outcome: Adequate for Discharge   Problem: Elimination: Goal: Will not experience complications related to bowel motility Outcome: Adequate for Discharge Goal: Will not experience complications related to urinary retention Outcome: Adequate for Discharge   Problem: Pain Managment: Goal: General experience of comfort will improve Outcome: Adequate for Discharge   Problem: Safety: Goal: Ability to remain free from injury will improve Outcome: Adequate for Discharge   Problem: Skin Integrity: Goal: Risk for impaired skin integrity will decrease Outcome: Adequate for Discharge   Problem: Education: Goal: Ability to demonstrate management of disease process will improve Outcome: Adequate for Discharge Goal: Ability to verbalize understanding of medication therapies will improve Outcome: Adequate for Discharge Goal:  Individualized Educational Video(s) Outcome: Adequate for Discharge   Problem: Activity: Goal: Capacity to carry out activities will improve Outcome: Adequate for Discharge   Problem: Cardiac: Goal: Ability to achieve and maintain adequate cardiopulmonary perfusion will improve Outcome: Adequate for Discharge   Problem: Acute Rehab PT Goals(only PT should resolve) Goal: Pt Will Go Supine/Side To Sit Outcome: Adequate for Discharge Goal: Patient Will Transfer Sit To/From Stand Outcome: Adequate for Discharge Goal: Pt Will Ambulate Outcome: Adequate for Discharge Goal: Pt/caregiver will Perform Home Exercise Program Outcome: Adequate for Discharge

## 2021-03-22 NOTE — Consult Note (Signed)
Reason for Consult: Renal failure Referring Physician: Dr. Cyndia Skeeters  Chief Complaint: SHortness of breath  Assessment/Plan: AKI on CKD3b BL Cr appears to be in the 2.5-3 range on review of office notes at [place]. On physical exam, review of charting and clinical history he appears to be euvolemic. The fluctuation is likely  hemodynamically mediated and clinically he feels much better with resolution of dyspnea back to baseline. He continues to be net neg /24hr period for the past 2 days. - Would continue to hold the diuretics for now as he appears to be euvolemic and is still neg 7.1 L during this hospitalization.  He is still net neg over a 24 hr period over the past 2 days. - Likely will need to restart the diuretics (on Lasix 40mg  BID at home) in the next 24-48hrs but would like to see stabilization of the renal function.  - Avoid nephrotoxic agents as you are doing (contrast, NSAIDs, Fleet, etc) - Avoid hypotensive episodes or relative hypotension. Hypertensive emergency - BP better controlled DM with an A1c of 5.8 Gastritis and bleeding thought to be secondary to an anal fissure. GI also recommends not starting HPylori tx at this time. OSA   HPI: SQUARE JOWETT is an 70 y.o. male DM HFpEF HTN HLD HOCM chronic hypoxia with 4L, anemia CKD3 w/ recent hospitalization for pneumonia + CHF exacerbation with d/c on 9/17 treated with Cefepime and Vancomycin before later presenting with shortness of breath and found by EMS to be hypoxic with SpO2 73% despite non rebreathable mask treated initially with CPAP. In the ED he was hypertensive and tachycardic w/ a right upper lob opacity on CXR. Patient treated with cefepime and diuresed w/ a good response.   Patient was COVID19 neg with a negative toxicology screen. Rectal bleeding was thought to be secondary to an anal fissure per GI. He was treated with Lasix in escalating doses with last dose of 60mg  BID on 9/27.  Abx were d/c on 9/26 and last dose of Vancomycin  was on 9/23 (single dose). Creatining had stabilized in the 2.8-2.9 ranage but has been rising since 9/27. He was still in negative fluid balance past 2 days with only -431mL  over the past 24hrs. During this hospitalization he is neg 7.1 L.   ROS Pertinent items are noted in HPI.  Chemistry and CBC: Creatinine, Ser  Date/Time Value Ref Range Status  03/22/2021 03:46 AM 3.38 (H) 0.61 - 1.24 mg/dL Final  03/21/2021 03:01 AM 3.12 (H) 0.61 - 1.24 mg/dL Final  03/20/2021 05:21 AM 2.83 (H) 0.61 - 1.24 mg/dL Final  03/19/2021 11:01 AM 2.81 (H) 0.61 - 1.24 mg/dL Final  03/18/2021 05:05 AM 2.89 (H) 0.61 - 1.24 mg/dL Final  03/17/2021 04:00 AM 2.67 (H) 0.61 - 1.24 mg/dL Final  03/16/2021 09:54 PM 2.70 (H) 0.61 - 1.24 mg/dL Final  03/16/2021 07:53 PM 2.64 (H) 0.61 - 1.24 mg/dL Final  03/09/2021 12:35 AM 2.53 (H) 0.61 - 1.24 mg/dL Final  03/08/2021 01:49 AM 3.52 (H) 0.61 - 1.24 mg/dL Final  03/07/2021 12:13 AM 3.78 (H) 0.61 - 1.24 mg/dL Final  03/06/2021 12:10 AM 3.00 (H) 0.61 - 1.24 mg/dL Final  03/05/2021 12:43 AM 2.96 (H) 0.61 - 1.24 mg/dL Final  03/04/2021 06:00 AM 2.56 (H) 0.61 - 1.24 mg/dL Final  03/03/2021 12:36 AM 2.70 (H) 0.61 - 1.24 mg/dL Final  03/02/2021 05:40 AM 2.83 (H) 0.61 - 1.24 mg/dL Final  02/28/2021 08:42 PM 2.93 (H) 0.61 - 1.24 mg/dL Final  Comment:    DELTA CHECK NOTED  01/31/2021 04:01 AM 2.67 (H) 0.61 - 1.24 mg/dL Final  01/29/2021 04:54 PM 2.30 (H) 0.61 - 1.24 mg/dL Final  05/13/2020 02:56 AM 2.83 (H) 0.61 - 1.24 mg/dL Final  05/12/2020 02:49 AM 2.54 (H) 0.61 - 1.24 mg/dL Final  05/13/2020 03:00 AM 2.86 (H) 0.61 - 1.24 mg/dL Final  05/09/2020 11:20 AM 2.54 (H) 0.76 - 1.27 mg/dL Final  02/21/2020 02:35 AM 2.46 (H) 0.61 - 1.24 mg/dL Final  02/20/2020 03:31 AM 2.28 (H) 0.61 - 1.24 mg/dL Final  02/19/2020 02:21 AM 2.16 (H) 0.61 - 1.24 mg/dL Final  02/18/2020 06:41 AM 2.23 (H) 0.61 - 1.24 mg/dL Final  02/11/2020 10:09 AM 2.62 (H) 0.76 - 1.27 mg/dL Final  01/16/2020  04:29 AM 3.00 (H) 0.61 - 1.24 mg/dL Final  01/15/2020 04:10 AM 3.29 (H) 0.61 - 1.24 mg/dL Final  01/14/2020 04:30 AM 2.54 (H) 0.61 - 1.24 mg/dL Final  01/13/2020 03:52 AM 2.43 (H) 0.61 - 1.24 mg/dL Final  01/12/2020 07:54 PM 2.65 (H) 0.61 - 1.24 mg/dL Final  01/11/2020 05:45 AM 2.56 (H) 0.61 - 1.24 mg/dL Final  10/29/2019 02:02 PM 2.02 (H) 0.76 - 1.27 mg/dL Final  09/14/2019 11:32 AM 2.69 (H) 0.76 - 1.27 mg/dL Final  09/01/2019 08:56 AM 2.37 (H) 0.76 - 1.27 mg/dL Final  02/16/2019 11:47 AM 2.04 (H) 0.76 - 1.27 mg/dL Final  06/30/2018 09:47 AM 1.81 (H) 0.40 - 1.50 mg/dL Final  08/01/2017 07:16 AM 1.80 (H) 0.61 - 1.24 mg/dL Final  07/31/2017 12:02 PM 2.46 (H) 0.61 - 1.24 mg/dL Final  07/01/2017 03:44 PM 1.55 (H) 0.76 - 1.27 mg/dL Final  06/10/2017 05:51 AM 1.66 (H) 0.61 - 1.24 mg/dL Final  06/09/2017 03:48 PM 2.09 (H) 0.61 - 1.24 mg/dL Final  06/09/2017 04:52 AM 2.22 (H) 0.61 - 1.24 mg/dL Final  06/08/2017 07:16 AM 3.07 (H) 0.61 - 1.24 mg/dL Final  11/07/2016 09:22 AM 1.45 (H) 0.76 - 1.27 mg/dL Final  05/04/2014 08:52 AM 1.5 0.4 - 1.5 mg/dL Final  09/09/2013 11:19 AM 1.4 0.4 - 1.5 mg/dL Final  07/24/2011 06:20 AM 1.58 (H) 0.50 - 1.35 mg/dL Final  07/23/2011 05:31 AM 1.60 (H) 0.50 - 1.35 mg/dL Final  07/22/2011 06:45 AM 1.78 (H) 0.50 - 1.35 mg/dL Final    Comment:    DELTA CHECK NOTED PER MICHAEL,RN AT 0816 07/22/11 BY ZPERRY.   Recent Labs  Lab 03/16/21 1953 03/16/21 2035 03/16/21 2154 03/17/21 0400 03/17/21 0405 03/18/21 0505 03/18/21 0929 03/19/21 1101 03/20/21 0521 03/21/21 0301 03/22/21 0346  NA 139   < >  --  139 142 138  --  138 135 135 134*  K 5.7*   < >  --  5.0 5.0 5.2* 5.6* 4.7 4.2 4.1 4.4  CL 108  --   --  106  --  103  --  103 101 100 101  CO2 25  --   --  26  --  27  --  30 28 25 25   GLUCOSE 181*  --   --  153*  --  117*  --  208* 170* 96 129*  BUN 49*  --   --  51*  --  58*  --  57* 62* 70* 70*  CREATININE 2.64*  --  2.70* 2.67*  --  2.89*  --  2.81* 2.83*  3.12* 3.38*  CALCIUM 8.6*  --   --  8.7*  --  8.6*  --  8.6* 8.4* 8.5* 8.4*  PHOS  --   --   --   --   --   --   --   --   --   --  4.4   < > = values in this interval not displayed.   Recent Labs  Lab 03/16/21 1953 03/16/21 2035 03/18/21 0505 03/19/21 1101 03/20/21 0521 03/22/21 0346  WBC 21.2*   < > 10.6* 8.8 6.6 5.4  NEUTROABS 17.0*  --  9.2* 7.7  --   --   HGB 7.6*   < > 7.7* 8.3* 8.0* 7.8*  HCT 26.9*   < > 24.8* 27.3* 25.8* 25.7*  MCV 100.7*   < > 93.6 95.1 94.2 94.8  PLT 312   < > 177 194 183 197   < > = values in this interval not displayed.   Liver Function Tests: Recent Labs  Lab 03/16/21 1953 03/19/21 1101 03/22/21 0346  AST 27 9*  --   ALT 20 12  --   ALKPHOS 75 62  --   BILITOT 0.8 0.8  --   PROT 7.0 6.9  --   ALBUMIN 2.9* 2.3* 2.2*   No results for input(s): LIPASE, AMYLASE in the last 168 hours. No results for input(s): AMMONIA in the last 168 hours. Cardiac Enzymes: No results for input(s): CKTOTAL, CKMB, CKMBINDEX, TROPONINI in the last 168 hours. [facility] Studies: No results for input(s): IRON, TIBC, TRANSFERRIN, FERRITIN in the last 72 hours. PT/INR: @LABRCNTIP (inr:5)  Xrays/Other Studies: ) Results for orders placed or performed during the hospital encounter of 03/16/21 (from the past 48 hour(s))  Glucose, capillary     Status: Abnormal   Collection Time: 03/20/21 11:04 AM  Result Value Ref Range   Glucose-Capillary 179 (H) 70 - 99 mg/dL    Comment: Glucose reference range applies only to samples taken after fasting for at least 8 hours.  Glucose, capillary     Status: Abnormal   Collection Time: 03/20/21  4:03 PM  Result Value Ref Range   Glucose-Capillary 155 (H) 70 - 99 mg/dL    Comment: Glucose reference range applies only to samples taken after fasting for at least 8 hours.  Glucose, capillary     Status: Abnormal   Collection Time: 03/20/21  9:08 PM  Result Value Ref Range   Glucose-Capillary 152 (H) 70 - 99 mg/dL    Comment: Glucose  reference range applies only to samples taken after fasting for at least 8 hours.   Comment 1 Notify RN    Comment 2 Document in Chart   Basic metabolic panel     Status: Abnormal   Collection Time: 03/21/21  3:01 AM  Result Value Ref Range   Sodium 135 135 - 145 mmol/L   Potassium 4.1 3.5 - 5.1 mmol/L   Chloride 100 98 - 111 mmol/L   CO2 25 22 - 32 mmol/L   Glucose, Bld 96 70 - 99 mg/dL    Comment: Glucose reference range applies only to samples taken after fasting for at least 8 hours.   BUN 70 (H) 8 - 23 mg/dL   Creatinine, Ser 3.12 (H) 0.61 - 1.24 mg/dL   Calcium 8.5 (L) 8.9 - 10.3 mg/dL   GFR, Estimated 20 (L) >60 mL/min    Comment: (NOTE) Calculated using the CKD-EPI Creatinine Equation (2021)    Anion gap 10 5 - 15    Comment: Performed at [facility] [address] [place], [place] [address]  Glucose, capillary  Status: Abnormal   Collection Time: 03/21/21  6:16 AM  Result Value Ref Range   Glucose-Capillary 185 (H) 70 - 99 mg/dL    Comment: Glucose reference range applies only to samples taken after fasting for at least 8 hours.   Comment 1 Notify RN    Comment 2 Document in Chart   Glucose, capillary     Status: Abnormal   Collection Time: 03/21/21 11:22 AM  Result Value Ref Range   Glucose-Capillary 163 (H) 70 - 99 mg/dL    Comment: Glucose reference range applies only to samples taken after fasting for at least 8 hours.  Glucose, capillary     Status: Abnormal   Collection Time: 03/21/21  4:06 PM  Result Value Ref Range   Glucose-Capillary 147 (H) 70 - 99 mg/dL    Comment: Glucose reference range applies only to samples taken after fasting for at least 8 hours.  Glucose, capillary     Status: Abnormal   Collection Time: 03/21/21  9:07 PM  Result Value Ref Range   Glucose-Capillary 250 (H) 70 - 99 mg/dL    Comment: Glucose reference range applies only to samples taken after fasting for at least 8 hours.   Comment 1 Notify RN    Comment 2 Document in  Chart   Renal function panel     Status: Abnormal   Collection Time: 03/22/21  3:46 AM  Result Value Ref Range   Sodium 134 (L) 135 - 145 mmol/L   Potassium 4.4 3.5 - 5.1 mmol/L   Chloride 101 98 - 111 mmol/L   CO2 25 22 - 32 mmol/L   Glucose, Bld 129 (H) 70 - 99 mg/dL    Comment: Glucose reference range applies only to samples taken after fasting for at least 8 hours.   BUN 70 (H) 8 - 23 mg/dL   Creatinine, Ser 3.38 (H) 0.61 - 1.24 mg/dL   Calcium 8.4 (L) 8.9 - 10.3 mg/dL   Phosphorus 4.4 2.5 - 4.6 mg/dL   Albumin 2.2 (L) 3.5 - 5.0 g/dL   GFR, Estimated 19 (L) >60 mL/min    Comment: (NOTE) Calculated using the CKD-EPI Creatinine Equation (2021)    Anion gap 8 5 - 15    Comment: Performed at [facility] [address], [place], [place] [address]  Magnesium     Status: Abnormal   Collection Time: 03/22/21  3:46 AM  Result Value Ref Range   Magnesium 2.6 (H) 1.7 - 2.4 mg/dL    Comment: Performed at [facility] [address], [place], [place] [address]  CBC     Status: Abnormal   Collection Time: 03/22/21  3:46 AM  Result Value Ref Range   WBC 5.4 4.0 - 10.5 K/uL   RBC 2.71 (L) 4.22 - 5.81 MIL/uL   Hemoglobin 7.8 (L) 13.0 - 17.0 g/dL   HCT 25.7 (L) 39.0 - 52.0 %   MCV 94.8 80.0 - 100.0 fL   MCH 28.8 26.0 - 34.0 pg   MCHC 30.4 30.0 - 36.0 g/dL   RDW 16.4 (H) 11.5 - 15.5 %   Platelets 197 150 - 400 K/uL   nRBC 0.0 0.0 - 0.2 %    Comment: Performed at [facility], [place] [address] [place], [place] [address]  Brain natriuretic peptide     Status: Abnormal   Collection Time: 03/22/21  3:46 AM  Result Value Ref Range   B Natriuretic Peptide 909.5 (H) 0.0 -  100.0 pg/mL    Comment: Performed at [facility], [place] [address] [place], [place] [address]  Glucose, capillary     Status: Abnormal   Collection Time: 03/22/21  6:09 AM  Result Value Ref Range   Glucose-Capillary 148 (H) 70 - 99 mg/dL    Comment: Glucose reference range applies only to samples  taken after fasting for at least 8 hours.   Comment 1 Notify RN    Comment 2 Document in Chart    DG Chest 1 View  Result Date: 03/20/2021 CLINICAL DATA:  Volume overload EXAM: CHEST  1 VIEW COMPARISON:  03/19/2021, CT 03/07/2021 FINDINGS: Cardiomegaly with vascular congestion. Mild perihilar ground-glass opacity overall diminished compared to the radiograph from 09/26. Trace pleural effusions. More focal consolidation in the right mid lung and medial right base are also slightly improved. Mild consolidation medial left base. Aortic atherosclerosis. No pneumothorax IMPRESSION: 1. Cardiomegaly with overall decreased bilateral ground-glass opacity/possible edema compared to prior. 2. There is residual consolidation in the right mid and lower lung and the medial left base which may reflect pneumonia Electronically Signed   By: Donavan Foil M.D.   On: 03/20/2021 19:42    PMH:   Past Medical History:  Diagnosis Date   Altered mental status    a. 05/2017 - adm with blurred vision, somnolence in setting of AKI and high blood sugar.   Anemia    CKD (chronic kidney disease), stage III (HCC)    Diabetes mellitus    Diastolic CHF, chronic ([facility])    A.  03/2009 Echo: EF 60-65%, Gr II diast dysfxn   Elevated troponin    a. 2013 - troponin 1.4. b. 2019 - troponin 0.32; neg nuc 07/2017.   Glaucoma    Hyperlipidemia    HYPERCHOLESTEROLEMIA   Hypertension    MARKED LEFT VENTRICULAR HYPERTROPHY BY PREVIOUS ECHOCARDIOGRAM--HE HAS HYPERDYNAMIC LEFT VENTRICULAR SYSTOLIC FUNCTION AND HAS IMPAIRED RELAXATION BY ECHO   Hypertrophic cardiomyopathy (HCC)    NSVT (nonsustained ventricular tachycardia) (HCC)    Premature atrial contractions    PVC's (premature ventricular contractions)    SVT (supraventricular tachycardia) (HCC)     PSH:   Past Surgical History:  Procedure Laterality Date   BIOPSY  03/03/2021   Procedure: BIOPSY;  Surgeon: Jackquline Denmark, MD;  Location: [facility];  Service: Endoscopy;;    BRONCHIAL BIOPSY  03/05/2021   Procedure: BRONCHIAL BIOPSIES;  Surgeon: Candee Furbish, MD;  Location: [facility] ENDOSCOPY;  Service: Pulmonary;;   BRONCHIAL BRUSHINGS  03/05/2021   Procedure: BRONCHIAL BRUSHINGS;  Surgeon: Candee Furbish, MD;  Location: [facility] ENDOSCOPY;  Service: Pulmonary;;   BRONCHIAL WASHINGS  03/05/2021   Procedure: BRONCHIAL WASHINGS;  Surgeon: Candee Furbish, MD;  Location: [facility] ENDOSCOPY;  Service: Pulmonary;;   ESOPHAGOGASTRODUODENOSCOPY (EGD) WITH PROPOFOL N/A 03/03/2021   Procedure: ESOPHAGOGASTRODUODENOSCOPY (EGD) WITH PROPOFOL;  Surgeon: Jackquline Denmark, MD;  Location: [facility] ENDOSCOPY;  Service: Endoscopy;  Laterality: N/A;   HEMOSTASIS CONTROL  03/05/2021   Procedure: HEMOSTASIS CONTROL;  Surgeon: Candee Furbish, MD;  Location: [facility] ENDOSCOPY;  Service: Pulmonary;;   NO PAST SURGERIES     VIDEO BRONCHOSCOPY N/A 03/05/2021   Procedure: VIDEO BRONCHOSCOPY WITH FLUORO;  Surgeon: Candee Furbish, MD;  Location: [facility] ENDOSCOPY;  Service: Pulmonary;  Laterality: N/A;    Allergies:  Allergies  Allergen Reactions   Aspirin Itching    Patient still takes a baby ASA everyday with no issues  03/17/21 not allergic    Medications:  Prior to Admission medications   Medication Sig Start Date End Date Taking? Authorizing Provider  amLODipine (NORVASC) 10 MG tablet Take 1 tablet (10 mg total) by mouth daily. 05/13/20  Yes Shawna Clamp, MD  aspirin EC 81 MG tablet Take 1 tablet (81 mg total) by mouth daily. Swallow whole. 03/02/20  Yes Bhagat, Bhavinkumar, PA  atorvastatin (LIPITOR) 20 MG tablet Take 1 tablet (20 mg total) by mouth daily. 03/16/18  Yes Dorothy Spark, MD  brimonidine (ALPHAGAN) 0.2 % ophthalmic solution Place 1 drop into the right eye 2 (two) times daily. 10/06/19  Yes [provider]  carvedilol (COREG) 12.5 MG tablet Take 1 tablet (12.5 mg total) by mouth 2 (two) times daily. 03/10/21 04/09/21 Yes Thurnell Lose, MD  cholecalciferol (VITAMIN D3) 25 MCG (1000 UT)  tablet Take 1,000 Units by mouth daily.   Yes [provider]  diclofenac Sodium (VOLTAREN) 1 % GEL Apply 4 g topically 4 (four) times daily as needed for pain. 02/07/20  Yes [provider]  docusate sodium (COLACE) 100 MG capsule Take 2 capsules (200 mg total) by mouth 2 (two) times daily. 03/10/21  Yes Thurnell Lose, MD  dorzolamide (TRUSOPT) 2 % ophthalmic solution Place 1 drop into both eyes 2 (two) times daily. 12/04/20  Yes [provider]  doxazosin (CARDURA) 2 MG tablet Take 2 mg by mouth daily. Patient not sure if still taking   Yes [provider]  furosemide (LASIX) 40 MG tablet Take 1 tablet (40 mg total) by mouth 2 (two) times daily. 05/13/20  Yes Shawna Clamp, MD  hydrALAZINE (APRESOLINE) 100 MG tablet Take 1 tablet (100 mg total) by mouth 3 (three) times daily. 03/16/20  Yes Skeet Latch, MD  Insulin Glargine 300 UNIT/ML SOPN Inject 20 Units into the skin at bedtime.   Yes [provider]  isosorbide mononitrate (IMDUR) 60 MG 24 hr tablet Take 60 mg by mouth daily. 04/27/20  Yes [provider]  latanoprost (XALATAN) 0.005 % ophthalmic solution Place 1 drop into the left eye at bedtime. 10/06/19  Yes [provider]  nitroGLYCERIN (NITROSTAT) 0.4 MG SL tablet DISSOLVE ONE TABLET UNDER THE TONGUE EVERY 5 MINUTES AS NEEDED FOR CHEST PAIN. Patient taking differently: Place 0.4 mg under the tongue every 5 (five) minutes as needed for chest pain. 03/27/20  Yes Dorothy Spark, MD  NOVOLOG RELION 100 UNIT/ML injection Inject 3-6 Units into the skin 3 (three) times daily with meals. Sugar less than 200 = 3 units Sugar more than 200 = 6 units 12/19/20  Yes [provider]  pantoprazole (PROTONIX) 40 MG tablet Take 1 tablet (40 mg total) by mouth 2 (two) times daily before a meal. 03/10/21  Yes Thurnell Lose, MD  polyethylene glycol (MIRALAX) 17 g packet Take 17 g by mouth daily. 03/10/21  Yes Thurnell Lose, MD   Continuous Blood Gluc Sensor (DEXCOM G6 SENSOR) MISC 1 Device by Does not apply route as directed. 01/12/21   Shamleffer, Melanie Crazier, MD  Continuous Blood Gluc Transmit (DEXCOM G6 TRANSMITTER) MISC 1 Device by Does not apply route as directed. 01/12/21   Shamleffer, Melanie Crazier, MD  glucose blood (ONETOUCH VERIO) test strip USE 1 STRIP TO CHECK GLUCOSE THREE TIMES DAILY AS DIRECTED 07/24/20   Shamleffer, Melanie Crazier, MD    Discontinued Meds:   Medications Discontinued During This Encounter  Medication Reason   ipratropium-albuterol (DUONEB) 0.5-2.5 (3) MG/3ML nebulizer solution 3 mL    heparin injection  5,000 Units    pantoprazole (PROTONIX) injection 40 mg    furosemide (LASIX) injection 40 mg    hydrALAZINE (APRESOLINE) injection 10 mg    clarithromycin (BIAXIN) 125 MG/5ML suspension 250 mg Change in therapy   metroNIDAZOLE (FLAGYL) 50 mg/ml oral suspension 500 mg    clarithromycin (BIAXIN) tablet 250 mg    metroNIDAZOLE (FLAGYL) 50 mg/ml oral suspension 500 mg    clarithromycin (BIAXIN) tablet 250 mg    carvedilol (COREG) tablet 6.25 mg    carvedilol (COREG) tablet 12.5 mg    insulin aspart (novoLOG) injection 0-9 Units    amLODipine (NORVASC) tablet 5 mg    pantoprazole (PROTONIX) injection 40 mg    hydrALAZINE (APRESOLINE) tablet 50 mg    polyethylene glycol (MIRALAX / GLYCOLAX) packet 17 g    ceFEPIme (MAXIPIME) 2 g in sodium chloride 0.9 % 100 mL IVPB    furosemide (LASIX) injection 60 mg    ceFEPIme (MAXIPIME) 2 g in sodium chloride 0.9 % 100 mL IVPB    furosemide (LASIX) tablet 60 mg    carvedilol (COREG) tablet 12.5 mg    Chlorhexidine Gluconate Cloth 2 % PADS 6 each     Social History:  reports that he has never smoked. He has never used smokeless tobacco. He reports that he does not drink alcohol and does not use drugs.  Family History:   Family History  Problem Relation Age of Onset   Hypertension Father    Stroke Father    Hypertension Mother     Diabetes Brother    Hypertension Brother    Diabetes Brother     Blood pressure (!) 158/73, pulse 69, temperature 98.4 F (36.9 C), temperature source Oral, resp. rate 20, weight 72.9 kg, SpO2 100 %. General appearance: alert, cooperative, and appears stated age Head: Normocephalic, without obvious abnormality, atraumatic Neck: no adenopathy, no carotid bruit, no JVD, supple, symmetrical, trachea midline, and thyroid not enlarged, symmetric, no tenderness/mass/nodules Back: symmetric, no curvature. ROM normal. No CVA tenderness. Resp: clear to auscultation bilaterally Cardio: regular rate and rhythm GI: soft, non-tender; bowel sounds normal; no masses,  no organomegaly Extremities: edema none Pulses:  L brachial 2+ R brachial 2+  L radial 2+ R radial 2+  L inguinal 2+ R inguinal 2+  L popliteal 2+ R popliteal 2+  L posterior tibial 2+ R posterior tibial 2+  L dorsalis pedis 2+ R dorsalis pedis 2+   Skin: Skin color, texture, turgor normal. No rashes or lesions       Kajol Crispen, Hunt Oris, MD 03/22/2021, 8:08 AM

## 2021-03-22 NOTE — Progress Notes (Signed)
SLP Cancellation Note  Patient Details Name: Robert Chaney MRN: [id number] DOB: [birth year]   Cancelled treatment:       Reason Eval/Treat Not Completed: Other (comment) pt getting ready to DC, RN reports good tolerance of diet and use of strategies. No further ST needs identified.    Clar[place], CCC-SLP Acute Rehabilitation Services   03/22/2021, 3:39 PM

## 2021-03-22 NOTE — Progress Notes (Signed)
Physical Therapy Treatment Patient Details Name: Robert Chaney MRN: [id number] DOB: 02/22/1951 Today's Date: 03/22/2021   History of Present Illness 70 y.o. male presented to ED via EMS 03/16/21 with complaints of respiratory distress.  Per EMS, the patient was found to be hypoxic, hypertensive (260/110) and tachycardic (100)..  SpO2 73% despite non-rebreather mask. He was reportedly placed on CPAP and subsequently became unresponsive.  In the ER, the patient has been placed on BiPAP.  He was also noted to be incontinent of bloody stool. 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 PMHx  type 2 diabetes mellitus, chronic kidney disease stage III, diastolic heart failure, dyslipidemia, hypertension, anemia, and SVT    PT Comments    Pt sitting EoB on entry, somewhat frustrated about whether or not he will discharge today. Ultimately, agreeable to work with therapy. Pt making good progress towards his goals, ambulating with improved velocity and endurance. Pt is currently mod I for transfers and supervision for long distance ambulation. Educated pt on need for several bouts of ambulation in his apartment daily, as he admits to rather sedentary lifestyle. Pt agreeable to increase his activity and work on better control of his CHF. Also, educated pt on need to ambulation with Mobility Team when asked. D/c plans remain appropriate.    Recommendations for follow up therapy are one component of a multi-disciplinary discharge planning process, led by the attending physician.  Recommendations may be updated based on patient status, additional functional criteria and insurance authorization.  Follow Up Recommendations  No PT follow up;Supervision - Intermittent     Equipment Recommendations  None recommended by PT       Precautions / Restrictions Precautions Precautions: Fall Precaution  Comments: Wears 4L O2 baseline Restrictions Weight Bearing Restrictions: No     Mobility  Bed Mobility               General bed mobility comments: sitting EoB on entry    Transfers Overall transfer level: Needs assistance Equipment used: Rolling walker (2 wheeled);None Transfers: Sit to/from Stand Sit to Stand: Supervision         General transfer comment: supervision for safety, good power up and self steady before reaching for RW  Ambulation/Gait Ambulation/Gait assistance: Modified independent (Device/Increase time) Gait Distance (Feet): 450 Feet Assistive device: Rolling walker (2 wheeled) Gait Pattern/deviations: WFL(Within Functional Limits);Step-through pattern;Shuffle Gait velocity: Decreased Gait velocity interpretation: 1.31 - 2.62 ft/sec, indicative of limited community ambulator General Gait Details: improved endurance with gait today, able to keep constant velocity and strong, steady gait       Balance Overall balance assessment: Needs assistance Sitting-balance support: No upper extremity supported;Feet supported Sitting balance-Leahy Scale: Good     Standing balance support: No upper extremity supported;During functional activity Standing balance-Leahy Scale: Fair Standing balance comment: can static stand without DME                            Cognition Arousal/Alertness: Awake/alert Behavior During Therapy: Flat affect Overall Cognitive Status: Within Functional Limits for tasks assessed                                        Exercises Other Exercises Other Exercises: discussed need for regular bouts of ambulation  throughout the day to maintain strength and endurance    General Comments General comments (skin integrity, edema, etc.): SaO2 >90%O2 with ambulation on 4L O2 via [place]      Pertinent Vitals/Pain Pain Assessment: No/denies pain     PT Goals (current goals can now be found in the care plan  section) Acute Rehab PT Goals Patient Stated Goal: return home PT Goal Formulation: With patient Time For Goal Achievement: 04/03/21 Potential to Achieve Goals: Good Progress towards PT goals: Progressing toward goals    Frequency    Min 3X/week      PT Plan Current plan remains appropriate       AM-PAC PT "6 Clicks" Mobility   Outcome Measure  Help needed turning from your back to your side while in a flat bed without using bedrails?: None Help needed moving from lying on your back to sitting on the side of a flat bed without using bedrails?: None Help needed moving to and from a bed to a chair (including a wheelchair)?: None Help needed standing up from a chair using your arms (e.g., wheelchair or bedside chair)?: None Help needed to walk in hospital room?: None Help needed climbing 3-5 steps with a railing? : A Little 6 Click Score: 23    End of Session Equipment Utilized During Treatment: Gait belt;Oxygen Activity Tolerance: Patient tolerated treatment well Patient left: in chair;with call bell/phone within reach;with chair alarm set Nurse Communication: Mobility status PT Visit Diagnosis: Other abnormalities of gait and mobility (R26.89);Muscle weakness (generalized) (M62.81)     Time: 4540-9811 PT Time Calculation (min) (ACUTE ONLY): 22 min  Charges:  $Therapeutic Exercise: 8-22 mins                     [place] Addis B. Migdalia Dk PT, DPT Acute Rehabilitation Services Pager [phone] Office [phone]    [place] 03/22/2021, 11:15 AM

## 2021-03-22 NOTE — TOC Initial Note (Addendum)
Transition of Care [facility]) - Initial/Assessment Note    Patient Details  Name: Robert Chaney MRN: [id number] Date of Birth: 1951-05-01  [facility] of [facility]) CM/SW Contact:    Zenon Mayo, RN Phone Number: 03/22/2021, 11:27 AM  Clinical Narrative:                 NCM spoke with patient at bedside, he states he was setting up his medical alert when he passed out., he was able to call 911.  NCM offered choice for [facility] for him , he states he does not have a preference.  NCM made referral to [facility] with [facility], she is able to take referral for CHF disease management.  He will also need transportation set up.  Lilia Pro with HF will set up [facility] transport to [place] apts for him. He states he will have transport to go home today, they will be here after 2 pm.   Expected Discharge Plan: [place] Barriers to Discharge: No Barriers Identified   Patient Goals and CMS Choice Patient states their goals for this hospitalization and ongoing recovery are:: return home CMS Medicare.gov Compare Post Acute Care list provided to:: Patient Choice offered to / list presented to : Patient  Expected Discharge Plan and Services Expected Discharge Plan: [place]   Discharge Planning Services: CM Consult   Living arrangements for the past 2 months: Single Family Home Expected Discharge Date: 03/22/21                 DME Agency: NA       HH Arranged: RN, Disease Management [place] Agency: [place] Date [facility] Agency Contacted: 03/22/21 Time HH Agency Contacted: 36 Representative spoke with at Center: Sunday Spillers  Prior Living Arrangements/Services Living arrangements for the past 2 months: [place] with:: Self Patient language and need for interpreter reviewed:: Yes Do you feel safe going back to the place where you live?: Yes      Need for Family Participation in Patient Care: Yes (Comment) Care giver support system in  place?: No (comment)   Criminal Activity/Legal Involvement Pertinent to Current Situation/Hospitalization: No - Comment as needed  Activities of Daily Living      Permission Sought/Granted                  Emotional Assessment Appearance:: Appears stated age Attitude/Demeanor/Rapport: Engaged Affect (typically observed): Appropriate Orientation: : Oriented to Self, Oriented to Place, Oriented to  Time, Oriented to Situation Alcohol / Substance Use: Not Applicable Psych Involvement: No (comment)  Admission diagnosis:  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[place]) [I42.1] Healthcare-associated pneumonia [J18.9] Acute respiratory failure with hypoxia (HCC) [J96.01] Acute on chronic diastolic CHF (congestive heart failure) (HCC) [I50.33] Gastrointestinal hemorrhage, unspecified gastrointestinal hemorrhage type [K92.2] Anemia, unspecified type [D64.9] Acute renal failure superimposed on chronic kidney disease, unspecified CKD stage, unspecified acute renal failure type ([place]) [N17.9, N18.9] Patient Active Problem List   Diagnosis Date Noted   Chronic constipation    Hematochezia    Chronic posterior anal fissure    Helicobacter pylori gastritis    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[place]) 03/16/2021   Abnormal CXR 03/16/2021   Elevated brain natriuretic peptide (BNP) level 03/16/2021   Left thyroid nodule 03/16/2021   GI bleed 03/16/2021   Acute blood loss anemia 03/16/2021   Acute on chronic respiratory failure with hypoxia and hypercapnia ([place]) 02/28/2021   Melena 02/28/2021   Symptomatic anemia 02/28/2021  Syncope 01/30/2021   Acute on chronic respiratory failure with hypoxemia (HCC) 01/30/2021   Multifocal pneumonia 01/30/2021   Acute on chronic respiratory failure with hypoxia ([place]) 05/15/2020   Hypertensive urgency 02/18/2020   CHF exacerbation ([place]) 01/11/2020   Acute respiratory failure ([place]) 01/11/2020   Type 2 diabetes mellitus with retinopathy, with long-term  current use of insulin ([place]) 07/16/2019   Type 2 diabetes mellitus with stage 4 chronic kidney disease, with long-term current use of insulin ([place]) 07/16/2019   Type 2 diabetes mellitus with diabetic polyneuropathy, with long-term current use of insulin ([place]) 07/16/2019   OSA (obstructive sleep apnea) 03/30/2019   Hyperglycemia 07/31/2017   Near syncope 07/31/2017   Acute-on-chronic kidney injury ([place]) 07/31/2017   AKI (acute kidney injury) ([place]) 06/09/2017   Hypertensive heart disease 11/07/2016   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[place]) 07/26/2015   Dyslipidemia 05/19/2013   Diabetic hyperosmolar non-ketotic state ([place]) 07/19/2011   DM (diabetes mellitus) with complications ([place]) 97/28/2060   Stage 4 chronic kidney disease ([place]) 07/19/2011   Elevated troponin 07/19/2011   Prolonged QT interval 07/19/2011   Hyperkalemia 07/19/2011   Volume depletion 07/19/2011   Acute on chronic diastolic CHF (congestive heart failure) ([place]) 07/19/2011   Essential hypertension 07/19/2011   Elevated CPK 07/19/2011   Hypercholesterolemia 02/20/2011   Benign hypertensive heart disease without heart failure 02/20/2011   PCP:  [name], MD Pharmacy:   [place], [address] [place] [address] Phone: [phone] Fax: [phone]  [facility] [place], [address] [place] [address] Phone: [phone] Fax: [phone]  [facility] (New Address) - [place], [place] AT Previously: [address], [address] [place] [address] Phone: [phone] Fax: [phone]  [facility] [address] [place] [address] Phone: [phone] Fax: [phone]     Social Determinants of Health (SDOH) Interventions     Readmission Risk Interventions Readmission Risk Prevention Plan 03/22/2021 03/05/2021 05/12/2020  Transportation Screening Complete Complete Complete  PCP or Specialist Appt within 3-5 Days - Complete Complete  HRI or [place] - Complete Complete  Social Work Consult for [place] Planning/Counseling - Complete Complete  Palliative Care Screening - Not Applicable Not Applicable  Medication Review Press photographer) Complete Complete Complete  PCP or Specialist appointment within 3-5 days of discharge Complete - -  [place] or Home Care Consult Complete - -  SW Recovery Care/Counseling Consult Complete - -  Palliative Care Screening Not Applicable - -  [place] Not Applicable - -  Some recent data might be hidden

## 2021-03-24 ENCOUNTER — Encounter (HOSPITAL_COMMUNITY): Payer: Self-pay

## 2021-03-24 ENCOUNTER — Emergency Department (HOSPITAL_COMMUNITY)
Admission: EM | Admit: 2021-03-24 | Discharge: 2021-03-25 | Disposition: A | Discharge status: Home / Self Care | Payer: PPO | Attending: Emergency Medicine | Admitting: Emergency Medicine

## 2021-03-24 ENCOUNTER — Other Ambulatory Visit: Payer: Self-pay

## 2021-03-24 DIAGNOSIS — Z9981 Dependence on supplemental oxygen: Secondary | ICD-10-CM | POA: Insufficient documentation

## 2021-03-24 DIAGNOSIS — N184 Chronic kidney disease, stage 4 (severe): Secondary | ICD-10-CM | POA: Insufficient documentation

## 2021-03-24 DIAGNOSIS — E1122 Type 2 diabetes mellitus with diabetic chronic kidney disease: Secondary | ICD-10-CM | POA: Diagnosis not present

## 2021-03-24 DIAGNOSIS — I5032 Chronic diastolic (congestive) heart failure: Secondary | ICD-10-CM | POA: Diagnosis not present

## 2021-03-24 DIAGNOSIS — I10 Essential (primary) hypertension: Secondary | ICD-10-CM | POA: Diagnosis not present

## 2021-03-24 DIAGNOSIS — Z7982 Long term (current) use of aspirin: Secondary | ICD-10-CM | POA: Insufficient documentation

## 2021-03-24 DIAGNOSIS — Z794 Long term (current) use of insulin: Secondary | ICD-10-CM | POA: Insufficient documentation

## 2021-03-24 DIAGNOSIS — Z79899 Other long term (current) drug therapy: Secondary | ICD-10-CM | POA: Insufficient documentation

## 2021-03-24 DIAGNOSIS — I130 Hypertensive heart and chronic kidney disease with heart failure and stage 1 through stage 4 chronic kidney disease, or unspecified chronic kidney disease: Secondary | ICD-10-CM | POA: Diagnosis not present

## 2021-03-24 DIAGNOSIS — T85698A Other mechanical complication of other specified internal prosthetic devices, implants and grafts, initial encounter: Secondary | ICD-10-CM | POA: Diagnosis not present

## 2021-03-24 DIAGNOSIS — R0902 Hypoxemia: Secondary | ICD-10-CM | POA: Diagnosis not present

## 2021-03-24 LAB — CBG MONITORING, ED
Glucose-Capillary: 132 mg/dL — ABNORMAL HIGH (ref 70–99)
Glucose-Capillary: 215 mg/dL — ABNORMAL HIGH (ref 70–99)

## 2021-03-24 MED ORDER — LATANOPROST 0.005 % OP SOLN
1.0000 [drp] | Freq: Every day | OPHTHALMIC | Status: DC
  Administered 2021-03-24: 1 [drp] via OPHTHALMIC
  Filled 2021-03-24: qty 2.5

## 2021-03-24 MED ORDER — SENNOSIDES-DOCUSATE SODIUM 8.6-50 MG PO TABS: 1.0000 | ORAL_TABLET | Freq: Two times a day (BID) | ORAL | Status: DC | PRN

## 2021-03-24 MED ORDER — INSULIN GLARGINE-YFGN 100 UNIT/ML ~~LOC~~ SOLN
10.0000 [IU] | Freq: Every day | SUBCUTANEOUS | Status: DC
  Administered 2021-03-24 – 2021-03-25 (×2): 10 [IU] via SUBCUTANEOUS
  Filled 2021-03-24 (×2): qty 0.1

## 2021-03-24 MED ORDER — BRIMONIDINE TARTRATE 0.2 % OP SOLN
1.0000 [drp] | Freq: Two times a day (BID) | OPHTHALMIC | Status: DC
  Administered 2021-03-24 – 2021-03-25 (×3): 1 [drp] via OPHTHALMIC
  Filled 2021-03-24: qty 5

## 2021-03-24 MED ORDER — NITROGLYCERIN 0.4 MG SL SUBL: 0.4000 mg | SUBLINGUAL_TABLET | SUBLINGUAL | Status: DC | PRN

## 2021-03-24 MED ORDER — ISOSORBIDE MONONITRATE ER 30 MG PO TB24
60.0000 mg | ORAL_TABLET | Freq: Every day | ORAL | Status: DC
  Administered 2021-03-24 – 2021-03-25 (×2): 60 mg via ORAL
  Filled 2021-03-24 (×2): qty 2

## 2021-03-24 MED ORDER — ATORVASTATIN CALCIUM 10 MG PO TABS
20.0000 mg | ORAL_TABLET | Freq: Every day | ORAL | Status: DC
  Administered 2021-03-24 – 2021-03-25 (×2): 20 mg via ORAL
  Filled 2021-03-24 (×2): qty 2

## 2021-03-24 MED ORDER — AMLODIPINE BESYLATE 5 MG PO TABS
10.0000 mg | ORAL_TABLET | Freq: Every day | ORAL | Status: DC
  Administered 2021-03-24 – 2021-03-25 (×2): 10 mg via ORAL
  Filled 2021-03-24 (×2): qty 2

## 2021-03-24 MED ORDER — DORZOLAMIDE HCL 2 % OP SOLN
1.0000 [drp] | Freq: Two times a day (BID) | OPHTHALMIC | Status: DC
  Administered 2021-03-24 – 2021-03-25 (×3): 1 [drp] via OPHTHALMIC
  Filled 2021-03-24: qty 10

## 2021-03-24 MED ORDER — POLYETHYLENE GLYCOL 3350 17 G PO PACK
17.0000 g | PACK | Freq: Every day | ORAL | Status: DC
  Administered 2021-03-24 – 2021-03-25 (×2): 17 g via ORAL
  Filled 2021-03-24 (×2): qty 1

## 2021-03-24 MED ORDER — VITAMIN D 25 MCG (1000 UNIT) PO TABS
1000.0000 [IU] | ORAL_TABLET | Freq: Every day | ORAL | Status: DC
  Administered 2021-03-24 – 2021-03-25 (×2): 1000 [IU] via ORAL
  Filled 2021-03-24 (×2): qty 1

## 2021-03-24 MED ORDER — DOCUSATE SODIUM 100 MG PO CAPS
200.0000 mg | ORAL_CAPSULE | Freq: Two times a day (BID) | ORAL | Status: DC
  Administered 2021-03-24 – 2021-03-25 (×3): 200 mg via ORAL
  Filled 2021-03-24 (×3): qty 2

## 2021-03-24 MED ORDER — PANTOPRAZOLE SODIUM 40 MG PO TBEC
40.0000 mg | DELAYED_RELEASE_TABLET | Freq: Two times a day (BID) | ORAL | Status: DC
  Administered 2021-03-24 – 2021-03-25 (×3): 40 mg via ORAL
  Filled 2021-03-24 (×3): qty 1

## 2021-03-24 MED ORDER — DOXAZOSIN MESYLATE 2 MG PO TABS
2.0000 mg | ORAL_TABLET | Freq: Every day | ORAL | Status: DC
  Administered 2021-03-25: 2 mg via ORAL
  Filled 2021-03-24 (×2): qty 1

## 2021-03-24 MED ORDER — CARVEDILOL 12.5 MG PO TABS
12.5000 mg | ORAL_TABLET | Freq: Two times a day (BID) | ORAL | Status: DC
  Administered 2021-03-24 – 2021-03-25 (×3): 12.5 mg via ORAL
  Filled 2021-03-24 (×3): qty 1

## 2021-03-24 MED ORDER — HYDRALAZINE HCL 50 MG PO TABS
100.0000 mg | ORAL_TABLET | Freq: Three times a day (TID) | ORAL | Status: DC
  Administered 2021-03-24 – 2021-03-25 (×4): 100 mg via ORAL
  Filled 2021-03-24 (×5): qty 2

## 2021-03-24 MED ORDER — FUROSEMIDE 20 MG PO TABS
40.0000 mg | ORAL_TABLET | Freq: Two times a day (BID) | ORAL | Status: DC
  Administered 2021-03-24 – 2021-03-25 (×3): 40 mg via ORAL
  Filled 2021-03-24 (×3): qty 2

## 2021-03-24 MED ORDER — ASPIRIN EC 81 MG PO TBEC
81.0000 mg | DELAYED_RELEASE_TABLET | Freq: Every day | ORAL | Status: DC
  Administered 2021-03-24 – 2021-03-25 (×2): 81 mg via ORAL
  Filled 2021-03-24 (×2): qty 1

## 2021-03-24 MED ORDER — DICLOFENAC SODIUM 1 % EX GEL
4.0000 g | Freq: Four times a day (QID) | CUTANEOUS | Status: DC | PRN
  Filled 2021-03-24: qty 100

## 2021-03-24 NOTE — ED Triage Notes (Addendum)
Pt from home BIB EMS for oxygen needs. Pt ran out of oxygen at home. Pt arrives hypertensive. BP 220/92 with EMS. Pt states he didn't take his BP medicine last night and doesn't know the name of medicine. Pt denies being out of medicine and states he just didn't take it when the power went out. Pt wears 4L  at baseline for a "lung problem." A/Ox4

## 2021-03-24 NOTE — Discharge Instructions (Addendum)
Continue your current medications.  Follow-up with your doctor as planned  [place]   [place], [place]   Hours: Monday-Friday 9a to [employer] Table  [employer]. [place], [place]   Hours: Tuesday-Friday 10 am to 1pm   [facility] at [place], [place]   Hours: Tuesday 2-330 pm

## 2021-03-24 NOTE — ED Notes (Signed)
Patient's family member attempting to get in contact with patient's brother to bring medications to him in the hospital.

## 2021-03-24 NOTE — TOC Initial Note (Signed)
Transition of Care [facility]) - Initial/Assessment Note    Patient Details  Name: Robert Chaney MRN: [id number] Date of Birth: January 03, 1951  [facility]) CM/SW Contact:    Verdell Carmine, RN Phone Number: 03/24/2021, 9:19 AM  Clinical Narrative:                 Patient  without power . Called [employer] for oxygen, they will set up to deliver a backup tank to the home as well as deliver a tank to get home. If patient going to be  extended period of time without power, [employer] is suggesting that patients go to a place that has power.         Patient Goals and CMS Choice        Expected Discharge Plan and Services      Tank will be sent and set up at home for back up                                          Prior Living Arrangements/Services                       Activities of Daily Living      Permission Sought/Granted                  Emotional Assessment              Admission diagnosis:  Needs oxygen Patient Active Problem List   Diagnosis Date Noted   Chronic constipation    Hematochezia    Chronic posterior anal fissure    Helicobacter pylori gastritis    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[place]) 03/16/2021   Abnormal CXR 03/16/2021   Elevated brain natriuretic peptide (BNP) level 03/16/2021   Left thyroid nodule 03/16/2021   GI bleed 03/16/2021   Acute blood loss anemia 03/16/2021   Acute on chronic respiratory failure with hypoxia and hypercapnia ([place]) 02/28/2021   Melena 02/28/2021   Symptomatic anemia 02/28/2021   Syncope 01/30/2021   Acute on chronic respiratory failure with hypoxemia ([place]) 01/30/2021   Multifocal pneumonia 01/30/2021   Acute on chronic respiratory failure with hypoxia ([place]) 04/29/2020   Hypertensive urgency 02/18/2020   CHF exacerbation ([place]) 01/11/2020   Acute respiratory failure ([place]) 01/11/2020   Type 2 diabetes mellitus with retinopathy, with long-term current use of insulin ([place]) 07/16/2019   Type 2  diabetes mellitus with stage 4 chronic kidney disease, with long-term current use of insulin ([place]) 07/16/2019   Type 2 diabetes mellitus with diabetic polyneuropathy, with long-term current use of insulin ([place]) 07/16/2019   OSA (obstructive sleep apnea) 03/30/2019   Hyperglycemia 07/31/2017   Near syncope 07/31/2017   Acute-on-chronic kidney injury ([place]) 07/31/2017   AKI (acute kidney injury) ([place]) 06/09/2017   Hypertensive heart disease 11/07/2016   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[place]) 07/26/2015   Dyslipidemia 05/19/2013   Diabetic hyperosmolar non-ketotic state ([place]) 07/19/2011   DM (diabetes mellitus) with complications ([place]) 50/02/3817   Stage 4 chronic kidney disease ([place]) 07/19/2011   Elevated troponin 07/19/2011   Prolonged QT interval 07/19/2011   Hyperkalemia 07/19/2011   Volume depletion 07/19/2011   Acute on chronic diastolic CHF (congestive heart failure) ([place]) 07/19/2011   Essential hypertension 07/19/2011   Elevated CPK 07/19/2011   Hypercholesterolemia 02/20/2011   Benign hypertensive heart disease without heart failure 02/20/2011   PCP:  [name], MD Pharmacy:   [place], [address] [place] [address] Phone: [phone] Fax: [phone]  [facility] [place], [place] [address] Phone: [phone] Fax: [phone], [place] (New Address) - [place], [place] AT Previously: [address], [place] [address] [place] [address] Phone: [phone] Fax: [phone]  [facility] [address] [place] [address] Phone: [phone] Fax: [phone]     Social Determinants of Health (SDOH) Interventions    Readmission Risk Interventions Readmission Risk  Prevention Plan 03/22/2021 03/05/2021 05/12/2020  Transportation Screening Complete Complete Complete  PCP or Specialist Appt within 3-5 Days - Complete Complete  HRI or [place] - Complete Complete  Social Work Consult for [place] Planning/Counseling - Complete Complete  Palliative Care Screening - Not Applicable Not Applicable  Medication Review Press photographer) Complete Complete Complete  PCP or Specialist appointment within 3-5 days of discharge Complete - -  [place] or Home Care Consult Complete - -  SW Recovery Care/Counseling Consult Complete - -  Palliative Care Screening Not Applicable - -  [place] Not Applicable - -  Some recent data might be hidden
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
## 2021-03-25 LAB — CBG MONITORING, ED: Glucose-Capillary: 201 mg/dL — ABNORMAL HIGH (ref 70–99)

## 2021-03-25 NOTE — ED Notes (Signed)
Patient is resting comfortably. 

## 2021-03-25 NOTE — ED Notes (Signed)
Pt has a bowel movement. BM was maroon is color. MD Pfieffer notified.

## 2021-03-25 NOTE — ED Notes (Signed)
Pt ambulatory to bathroom

## 2021-03-25 NOTE — Care Management (Addendum)
Spoke to RN about why the patient was still here. He was supposed to board on [facility] ,but they could not provide the need. He is currently in a hallway bed and using oxygen tanks. The tank that was sent yesterday from [employer] has been used. They are unsure if they can get him in a place in the ED where he can stop using tanks. There is a question of food insecurity, called CSW to talk to patinet and see if we can address concerns nad get patient back home, as long as power is on.   1400 patient has been cleared again for DC after a melena stool, Attempted to call contact patient brother, no answer voicemail full.  Looked at [employer] for address of patient, no outage listed. Marland Kitchen  Called [employer] for a tank of oxygen to get patient home. Wcannot get a hold of family , will assist in transport as long as patient has a key to apartment
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
## 2021-03-25 NOTE — ED Notes (Signed)
Placed Breakfast order 

## 2021-03-26 DIAGNOSIS — E1122 Type 2 diabetes mellitus with diabetic chronic kidney disease: Secondary | ICD-10-CM | POA: Diagnosis not present

## 2021-03-26 DIAGNOSIS — N183 Chronic kidney disease, stage 3 unspecified: Secondary | ICD-10-CM | POA: Diagnosis not present

## 2021-03-26 DIAGNOSIS — I509 Heart failure, unspecified: Secondary | ICD-10-CM | POA: Diagnosis not present

## 2021-03-26 DIAGNOSIS — I10 Essential (primary) hypertension: Secondary | ICD-10-CM | POA: Diagnosis not present

## 2021-03-26 DIAGNOSIS — J9611 Chronic respiratory failure with hypoxia: Secondary | ICD-10-CM | POA: Diagnosis not present

## 2021-03-26 LAB — CULTURE, FUNGUS WITHOUT SMEAR

## 2021-03-27 DIAGNOSIS — Z794 Long term (current) use of insulin: Secondary | ICD-10-CM | POA: Diagnosis not present

## 2021-03-27 DIAGNOSIS — E041 Nontoxic single thyroid nodule: Secondary | ICD-10-CM | POA: Diagnosis not present

## 2021-03-27 DIAGNOSIS — I472 Ventricular tachycardia, unspecified: Secondary | ICD-10-CM | POA: Diagnosis not present

## 2021-03-27 DIAGNOSIS — K921 Melena: Secondary | ICD-10-CM | POA: Diagnosis not present

## 2021-03-27 DIAGNOSIS — J9621 Acute and chronic respiratory failure with hypoxia: Secondary | ICD-10-CM | POA: Diagnosis not present

## 2021-03-27 DIAGNOSIS — D631 Anemia in chronic kidney disease: Secondary | ICD-10-CM | POA: Diagnosis not present

## 2021-03-27 DIAGNOSIS — Z9981 Dependence on supplemental oxygen: Secondary | ICD-10-CM | POA: Diagnosis not present

## 2021-03-27 DIAGNOSIS — E1165 Type 2 diabetes mellitus with hyperglycemia: Secondary | ICD-10-CM | POA: Diagnosis not present

## 2021-03-27 DIAGNOSIS — N184 Chronic kidney disease, stage 4 (severe): Secondary | ICD-10-CM | POA: Diagnosis not present

## 2021-03-27 DIAGNOSIS — I130 Hypertensive heart and chronic kidney disease with heart failure and stage 1 through stage 4 chronic kidney disease, or unspecified chronic kidney disease: Secondary | ICD-10-CM | POA: Diagnosis not present

## 2021-03-27 DIAGNOSIS — R55 Syncope and collapse: Secondary | ICD-10-CM | POA: Diagnosis not present

## 2021-03-27 DIAGNOSIS — K5909 Other constipation: Secondary | ICD-10-CM | POA: Diagnosis not present

## 2021-03-27 DIAGNOSIS — K601 Chronic anal fissure: Secondary | ICD-10-CM | POA: Diagnosis not present

## 2021-03-27 DIAGNOSIS — I5032 Chronic diastolic (congestive) heart failure: Secondary | ICD-10-CM | POA: Diagnosis not present

## 2021-03-27 DIAGNOSIS — E7800 Pure hypercholesterolemia, unspecified: Secondary | ICD-10-CM | POA: Diagnosis not present

## 2021-03-27 DIAGNOSIS — G4733 Obstructive sleep apnea (adult) (pediatric): Secondary | ICD-10-CM | POA: Diagnosis not present

## 2021-03-27 DIAGNOSIS — D62 Acute posthemorrhagic anemia: Secondary | ICD-10-CM | POA: Diagnosis not present

## 2021-03-27 DIAGNOSIS — E1122 Type 2 diabetes mellitus with diabetic chronic kidney disease: Secondary | ICD-10-CM | POA: Diagnosis not present

## 2021-03-27 DIAGNOSIS — I421 Obstructive hypertrophic cardiomyopathy: Secondary | ICD-10-CM | POA: Diagnosis not present

## 2021-03-27 DIAGNOSIS — K922 Gastrointestinal hemorrhage, unspecified: Secondary | ICD-10-CM | POA: Diagnosis not present

## 2021-03-27 DIAGNOSIS — E1142 Type 2 diabetes mellitus with diabetic polyneuropathy: Secondary | ICD-10-CM | POA: Diagnosis not present

## 2021-03-27 DIAGNOSIS — K2970 Gastritis, unspecified, without bleeding: Secondary | ICD-10-CM | POA: Diagnosis not present

## 2021-03-27 DIAGNOSIS — J810 Acute pulmonary edema: Secondary | ICD-10-CM | POA: Diagnosis not present

## 2021-03-27 DIAGNOSIS — E11319 Type 2 diabetes mellitus with unspecified diabetic retinopathy without macular edema: Secondary | ICD-10-CM | POA: Diagnosis not present

## 2021-03-27 NOTE — Progress Notes (Deleted)
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 Hypertrophic Cardiomyopathy; Risk                 Factors:Diabetes, Dyslipidemia and Hypertension.  Sonographer:    Bernadene Person RDCS Referring Phys: [id number] [place]  1. Limited echo for shunting in the setting of hypoxia  2. Agitated saline contrast bubble study was positive with shunting observed after >6 cardiac cycles suggestive of intrapulmonary shunting. A moderate number of microbubbles were noted, consistent with a more significant shunt.  3. Left ventricular ejection fraction, by estimation, is 65 to 70%. The left ventricle has normal function. Comparison(s): Changes from prior study are noted. 01/30/2021: LVEF 70-75%. FINDINGS  Left Ventricle: Left ventricular ejection fraction, by estimation, is 65 to 70%. The left ventricle has normal function. IAS/Shunts: No atrial level shunt detected by color flow Doppler. Agitated saline contrast was given intravenously to evaluate for intracardiac shunting. Agitated saline contrast bubble study was positive with shunting observed after >6 cardiac cycles suggestive of intrapulmonary shunting. Lyman Bishop MD Electronically signed by Lyman Bishop MD Signature Date/Time:  03/19/2021/3:31:33 PM    Final    VAS [place] LOWER EXTREMITY VENOUS (DVT)  Result Date: 03/17/2021  Lower Venous DVT Study Patient Name:  Robert Chaney  Date of Exam:   03/17/2021 Medical Rec #: [id number]      Accession #:    [id number] Date of Birth: 07/14/1950       Patient Gender: M Patient Age:   64 years Exam Location:  [facility] Procedure:      VAS [place] LOWER EXTREMITY VENOUS (DVT) Referring Phys: Renee Pain --------------------------------------------------------------------------------  Indications: Edema.  Limitations: Acoustic shadowing due to overlying calcification. Comparison Study: 01-14-2020 Bilateral lower extremity venous study was negative                   for DVT.                    01-08-2021 ABI w/ TBI showed bilateral absent DPA, moderate                   right arterial disease, mild left arterial disease. Performing Technologist: Darlin Coco RDMS, RVT  Examination Guidelines: A complete evaluation includes B-mode imaging, spectral Doppler, color Doppler, and power Doppler as needed of all accessible portions of each vessel. Bilateral testing is considered an integral part of a complete examination. Limited examinations for reoccurring indications may be performed as noted. The reflux portion of the exam is performed with the patient in reverse Trendelenburg.  +---------+---------------+---------+-----------+----------+--------------+ RIGHT    CompressibilityPhasicitySpontaneityPropertiesThrombus Aging +---------+---------------+---------+-----------+----------+--------------+ CFV      Full           Yes      Yes                                 +---------+---------------+---------+-----------+----------+--------------+ SFJ      Full                                                        +---------+---------------+---------+-----------+----------+--------------+ FV Prox  Full                                                         +---------+---------------+---------+-----------+----------+--------------+ FV Mid   Full                                                        +---------+---------------+---------+-----------+----------+--------------+ FV DistalFull                                                        +---------+---------------+---------+-----------+----------+--------------+ PFV      Full                                                        +---------+---------------+---------+-----------+----------+--------------+  POP      Full           Yes      Yes                                 +---------+---------------+---------+-----------+----------+--------------+ PTV      Full                                                        +---------+---------------+---------+-----------+----------+--------------+ PERO     Full                                                        +---------+---------------+---------+-----------+----------+--------------+ Gastroc  Full                                                        +---------+---------------+---------+-----------+----------+--------------+   +---------+---------------+---------+-----------+----------+--------------+ LEFT     CompressibilityPhasicitySpontaneityPropertiesThrombus Aging +---------+---------------+---------+-----------+----------+--------------+ CFV      Full           Yes      Yes                                 +---------+---------------+---------+-----------+----------+--------------+ SFJ      Full                                                        +---------+---------------+---------+-----------+----------+--------------+ FV Prox  Full                                                        +---------+---------------+---------+-----------+----------+--------------+ FV Mid   Full                                                         +---------+---------------+---------+-----------+----------+--------------+ FV DistalFull                                                        +---------+---------------+---------+-----------+----------+--------------+ PFV      Full                                                        +---------+---------------+---------+-----------+----------+--------------+  POP      Full           Yes      Yes                                 +---------+---------------+---------+-----------+----------+--------------+ PTV      Full                                                        +---------+---------------+---------+-----------+----------+--------------+ PERO     Full                                                        +---------+---------------+---------+-----------+----------+--------------+ Gastroc  Full                                                        +---------+---------------+---------+-----------+----------+--------------+     Summary: RIGHT: - There is no evidence of deep vein thrombosis in the lower extremity.  - No cystic structure found in the popliteal fossa.  LEFT: - There is no evidence of deep vein thrombosis in the lower extremity.  - No cystic structure found in the popliteal fossa.  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 *See table(s) above for measurements and observations. Electronically signed by Servando Snare MD on 03/17/2021 at 5:23:46 PM.    Final    DG C-ARM BRONCHOSCOPY  Result Date: 03/05/2021 C-ARM BRONCHOSCOPY: Fluoroscopy was utilized by the requesting physician.  No radiographic interpretation.     Assessment & Plan:   No problem-specific Assessment & Plan notes found for this encounter.     Martyn Ehrich, NP 03/27/2021

## 2021-03-28 ENCOUNTER — Inpatient Hospital Stay: Payer: PPO | Admitting: Primary Care

## 2021-04-01 ENCOUNTER — Emergency Department (HOSPITAL_COMMUNITY): Payer: PPO

## 2021-04-01 ENCOUNTER — Other Ambulatory Visit: Payer: Self-pay

## 2021-04-01 ENCOUNTER — Encounter (HOSPITAL_COMMUNITY): Payer: Self-pay | Admitting: *Deleted

## 2021-04-01 ENCOUNTER — Inpatient Hospital Stay (HOSPITAL_COMMUNITY)
Admission: EM | Admit: 2021-04-01 | Discharge: 2021-04-14 | DRG: 291 | Disposition: A | Discharge status: Home Health | Payer: PPO | Attending: Internal Medicine | Admitting: Internal Medicine

## 2021-04-01 DIAGNOSIS — B9681 Helicobacter pylori [H. pylori] as the cause of diseases classified elsewhere: Secondary | ICD-10-CM | POA: Diagnosis not present

## 2021-04-01 DIAGNOSIS — K602 Anal fissure, unspecified: Secondary | ICD-10-CM | POA: Diagnosis present

## 2021-04-01 DIAGNOSIS — K5909 Other constipation: Secondary | ICD-10-CM | POA: Diagnosis not present

## 2021-04-01 DIAGNOSIS — N179 Acute kidney failure, unspecified: Secondary | ICD-10-CM | POA: Diagnosis not present

## 2021-04-01 DIAGNOSIS — I509 Heart failure, unspecified: Secondary | ICD-10-CM | POA: Diagnosis not present

## 2021-04-01 DIAGNOSIS — J80 Acute respiratory distress syndrome: Secondary | ICD-10-CM | POA: Diagnosis not present

## 2021-04-01 DIAGNOSIS — I110 Hypertensive heart disease with heart failure: Secondary | ICD-10-CM | POA: Diagnosis not present

## 2021-04-01 DIAGNOSIS — D120 Benign neoplasm of cecum: Secondary | ICD-10-CM | POA: Diagnosis not present

## 2021-04-01 DIAGNOSIS — I422 Other hypertrophic cardiomyopathy: Secondary | ICD-10-CM | POA: Diagnosis present

## 2021-04-01 DIAGNOSIS — N184 Chronic kidney disease, stage 4 (severe): Secondary | ICD-10-CM | POA: Diagnosis present

## 2021-04-01 DIAGNOSIS — D122 Benign neoplasm of ascending colon: Secondary | ICD-10-CM | POA: Diagnosis present

## 2021-04-01 DIAGNOSIS — I169 Hypertensive crisis, unspecified: Secondary | ICD-10-CM | POA: Diagnosis not present

## 2021-04-01 DIAGNOSIS — E440 Moderate protein-calorie malnutrition: Secondary | ICD-10-CM | POA: Diagnosis not present

## 2021-04-01 DIAGNOSIS — K5730 Diverticulosis of large intestine without perforation or abscess without bleeding: Secondary | ICD-10-CM | POA: Diagnosis not present

## 2021-04-01 DIAGNOSIS — J90 Pleural effusion, not elsewhere classified: Secondary | ICD-10-CM | POA: Diagnosis not present

## 2021-04-01 DIAGNOSIS — J189 Pneumonia, unspecified organism: Secondary | ICD-10-CM | POA: Diagnosis not present

## 2021-04-01 DIAGNOSIS — E1165 Type 2 diabetes mellitus with hyperglycemia: Secondary | ICD-10-CM | POA: Diagnosis present

## 2021-04-01 DIAGNOSIS — I5033 Acute on chronic diastolic (congestive) heart failure: Secondary | ICD-10-CM | POA: Diagnosis present

## 2021-04-01 DIAGNOSIS — Z7189 Other specified counseling: Secondary | ICD-10-CM | POA: Diagnosis not present

## 2021-04-01 DIAGNOSIS — I214 Non-ST elevation (NSTEMI) myocardial infarction: Secondary | ICD-10-CM

## 2021-04-01 DIAGNOSIS — R0602 Shortness of breath: Secondary | ICD-10-CM | POA: Diagnosis not present

## 2021-04-01 DIAGNOSIS — E118 Type 2 diabetes mellitus with unspecified complications: Secondary | ICD-10-CM | POA: Diagnosis not present

## 2021-04-01 DIAGNOSIS — K625 Hemorrhage of anus and rectum: Secondary | ICD-10-CM

## 2021-04-01 DIAGNOSIS — E1122 Type 2 diabetes mellitus with diabetic chronic kidney disease: Secondary | ICD-10-CM | POA: Diagnosis not present

## 2021-04-01 DIAGNOSIS — D631 Anemia in chronic kidney disease: Secondary | ICD-10-CM | POA: Diagnosis not present

## 2021-04-01 DIAGNOSIS — R739 Hyperglycemia, unspecified: Secondary | ICD-10-CM | POA: Diagnosis not present

## 2021-04-01 DIAGNOSIS — R0902 Hypoxemia: Secondary | ICD-10-CM | POA: Diagnosis not present

## 2021-04-01 DIAGNOSIS — C19 Malignant neoplasm of rectosigmoid junction: Secondary | ICD-10-CM | POA: Diagnosis not present

## 2021-04-01 DIAGNOSIS — Z66 Do not resuscitate: Secondary | ICD-10-CM | POA: Diagnosis not present

## 2021-04-01 DIAGNOSIS — I248 Other forms of acute ischemic heart disease: Secondary | ICD-10-CM | POA: Diagnosis present

## 2021-04-01 DIAGNOSIS — J9621 Acute and chronic respiratory failure with hypoxia: Secondary | ICD-10-CM | POA: Diagnosis present

## 2021-04-01 DIAGNOSIS — J9811 Atelectasis: Secondary | ICD-10-CM | POA: Diagnosis not present

## 2021-04-01 DIAGNOSIS — I161 Hypertensive emergency: Secondary | ICD-10-CM | POA: Diagnosis not present

## 2021-04-01 DIAGNOSIS — D124 Benign neoplasm of descending colon: Secondary | ICD-10-CM | POA: Diagnosis not present

## 2021-04-01 DIAGNOSIS — J440 Chronic obstructive pulmonary disease with acute lower respiratory infection: Secondary | ICD-10-CM | POA: Diagnosis not present

## 2021-04-01 DIAGNOSIS — I2510 Atherosclerotic heart disease of native coronary artery without angina pectoris: Secondary | ICD-10-CM | POA: Diagnosis not present

## 2021-04-01 DIAGNOSIS — D62 Acute posthemorrhagic anemia: Secondary | ICD-10-CM | POA: Diagnosis present

## 2021-04-01 DIAGNOSIS — C20 Malignant neoplasm of rectum: Secondary | ICD-10-CM

## 2021-04-01 DIAGNOSIS — I130 Hypertensive heart and chronic kidney disease with heart failure and stage 1 through stage 4 chronic kidney disease, or unspecified chronic kidney disease: Principal | ICD-10-CM | POA: Diagnosis present

## 2021-04-01 DIAGNOSIS — E1151 Type 2 diabetes mellitus with diabetic peripheral angiopathy without gangrene: Secondary | ICD-10-CM | POA: Diagnosis not present

## 2021-04-01 DIAGNOSIS — I517 Cardiomegaly: Secondary | ICD-10-CM | POA: Diagnosis not present

## 2021-04-01 DIAGNOSIS — R0689 Other abnormalities of breathing: Secondary | ICD-10-CM | POA: Diagnosis not present

## 2021-04-01 DIAGNOSIS — Z20822 Contact with and (suspected) exposure to covid-19: Secondary | ICD-10-CM | POA: Diagnosis present

## 2021-04-01 DIAGNOSIS — Z515 Encounter for palliative care: Secondary | ICD-10-CM | POA: Diagnosis not present

## 2021-04-01 DIAGNOSIS — N281 Cyst of kidney, acquired: Secondary | ICD-10-CM | POA: Diagnosis not present

## 2021-04-01 DIAGNOSIS — K921 Melena: Secondary | ICD-10-CM | POA: Diagnosis present

## 2021-04-01 DIAGNOSIS — Y95 Nosocomial condition: Secondary | ICD-10-CM | POA: Diagnosis present

## 2021-04-01 DIAGNOSIS — E785 Hyperlipidemia, unspecified: Secondary | ICD-10-CM | POA: Diagnosis present

## 2021-04-01 DIAGNOSIS — Z6825 Body mass index (BMI) 25.0-25.9, adult: Secondary | ICD-10-CM

## 2021-04-01 DIAGNOSIS — E119 Type 2 diabetes mellitus without complications: Secondary | ICD-10-CM | POA: Diagnosis not present

## 2021-04-01 DIAGNOSIS — Z79899 Other long term (current) drug therapy: Secondary | ICD-10-CM

## 2021-04-01 DIAGNOSIS — G4733 Obstructive sleep apnea (adult) (pediatric): Secondary | ICD-10-CM | POA: Diagnosis present

## 2021-04-01 DIAGNOSIS — I9589 Other hypotension: Secondary | ICD-10-CM | POA: Diagnosis present

## 2021-04-01 DIAGNOSIS — K6389 Other specified diseases of intestine: Secondary | ICD-10-CM | POA: Diagnosis not present

## 2021-04-01 DIAGNOSIS — D509 Iron deficiency anemia, unspecified: Secondary | ICD-10-CM | POA: Diagnosis not present

## 2021-04-01 DIAGNOSIS — Z794 Long term (current) use of insulin: Secondary | ICD-10-CM

## 2021-04-01 DIAGNOSIS — K635 Polyp of colon: Secondary | ICD-10-CM | POA: Diagnosis not present

## 2021-04-01 DIAGNOSIS — J9601 Acute respiratory failure with hypoxia: Secondary | ICD-10-CM | POA: Diagnosis not present

## 2021-04-01 DIAGNOSIS — Z833 Family history of diabetes mellitus: Secondary | ICD-10-CM

## 2021-04-01 DIAGNOSIS — D490 Neoplasm of unspecified behavior of digestive system: Secondary | ICD-10-CM | POA: Diagnosis not present

## 2021-04-01 DIAGNOSIS — I421 Obstructive hypertrophic cardiomyopathy: Secondary | ICD-10-CM | POA: Diagnosis present

## 2021-04-01 DIAGNOSIS — K6289 Other specified diseases of anus and rectum: Secondary | ICD-10-CM | POA: Diagnosis not present

## 2021-04-01 DIAGNOSIS — R531 Weakness: Secondary | ICD-10-CM | POA: Diagnosis not present

## 2021-04-01 DIAGNOSIS — E875 Hyperkalemia: Secondary | ICD-10-CM | POA: Diagnosis not present

## 2021-04-01 DIAGNOSIS — D649 Anemia, unspecified: Secondary | ICD-10-CM | POA: Diagnosis not present

## 2021-04-01 DIAGNOSIS — Z8249 Family history of ischemic heart disease and other diseases of the circulatory system: Secondary | ICD-10-CM

## 2021-04-01 DIAGNOSIS — D500 Iron deficiency anemia secondary to blood loss (chronic): Secondary | ICD-10-CM | POA: Diagnosis not present

## 2021-04-01 DIAGNOSIS — Z7982 Long term (current) use of aspirin: Secondary | ICD-10-CM

## 2021-04-01 DIAGNOSIS — E7800 Pure hypercholesterolemia, unspecified: Secondary | ICD-10-CM | POA: Diagnosis present

## 2021-04-01 DIAGNOSIS — I700 Atherosclerosis of aorta: Secondary | ICD-10-CM | POA: Diagnosis not present

## 2021-04-01 LAB — BASIC METABOLIC PANEL
Anion gap: 6 (ref 5–15)
Anion gap: 5 (ref 5–15)
BUN: 64 mg/dL — ABNORMAL HIGH (ref 8–23)
BUN: 60 mg/dL — ABNORMAL HIGH (ref 8–23)
CO2: 26 mmol/L (ref 22–32)
CO2: 25 mmol/L (ref 22–32)
Calcium: 9.1 mg/dL (ref 8.9–10.3)
Calcium: 9 mg/dL (ref 8.9–10.3)
Chloride: 110 mmol/L (ref 98–111)
Chloride: 114 mmol/L — ABNORMAL HIGH (ref 98–111)
Creatinine, Ser: 2.83 mg/dL — ABNORMAL HIGH (ref 0.61–1.24)
Creatinine, Ser: 2.55 mg/dL — ABNORMAL HIGH (ref 0.61–1.24)
GFR, Estimated: 23 mL/min — ABNORMAL LOW (ref >60)
GFR, Estimated: 26 mL/min — ABNORMAL LOW (ref >60)
Glucose, Bld: 252 mg/dL — ABNORMAL HIGH (ref 70–99)
Glucose, Bld: 164 mg/dL — ABNORMAL HIGH (ref 70–99)
Potassium: 6 mmol/L — ABNORMAL HIGH (ref 3.5–5.1)
Potassium: 5.7 mmol/L — ABNORMAL HIGH (ref 3.5–5.1)
Sodium: 142 mmol/L (ref 135–145)
Sodium: 144 mmol/L (ref 135–145)

## 2021-04-01 LAB — PROCALCITONIN: Procalcitonin: 0.26 ng/mL

## 2021-04-01 LAB — I-STAT VENOUS BLOOD GAS, ED
Acid-Base Excess: 0 mmol/L (ref 0.0–2.0)
Bicarbonate: 26.4 mmol/L (ref 20.0–28.0)
Calcium, Ion: 1.28 mmol/L (ref 1.15–1.40)
HCT: 24 % — ABNORMAL LOW (ref 39.0–52.0)
Hemoglobin: 8.2 g/dL — ABNORMAL LOW (ref 13.0–17.0)
O2 Saturation: 54 %
Potassium: 6 mmol/L — ABNORMAL HIGH (ref 3.5–5.1)
Sodium: 145 mmol/L (ref 135–145)
TCO2: 28 mmol/L (ref 22–32)
pCO2, Ven: 52.7 mmHg (ref 44.0–60.0)
pH, Ven: 7.308 (ref 7.250–7.430)
pO2, Ven: 32 mmHg (ref 32.0–45.0)

## 2021-04-01 LAB — BRAIN NATRIURETIC PEPTIDE: B Natriuretic Peptide: 1586.6 pg/mL — ABNORMAL HIGH (ref 0.0–100.0)

## 2021-04-01 LAB — CBC WITH DIFFERENTIAL/PLATELET
Abs Immature Granulocytes: 0.03 10*3/uL (ref 0.00–0.07)
Basophils Absolute: 0 10*3/uL (ref 0.0–0.1)
Basophils Relative: 0 %
Eosinophils Absolute: 0.1 10*3/uL (ref 0.0–0.5)
Eosinophils Relative: 1 %
HCT: 26.5 % — ABNORMAL LOW (ref 39.0–52.0)
Hemoglobin: 7.7 g/dL — ABNORMAL LOW (ref 13.0–17.0)
Immature Granulocytes: 0 %
Lymphocytes Relative: 13 %
Lymphs Abs: 1.2 10*3/uL (ref 0.7–4.0)
MCH: 28.7 pg (ref 26.0–34.0)
MCHC: 29.1 g/dL — ABNORMAL LOW (ref 30.0–36.0)
MCV: 98.9 fL (ref 80.0–100.0)
Monocytes Absolute: 0.6 10*3/uL (ref 0.1–1.0)
Monocytes Relative: 6 %
Neutro Abs: 7.3 10*3/uL (ref 1.7–7.7)
Neutrophils Relative %: 80 %
Platelets: 317 10*3/uL (ref 150–400)
RBC: 2.68 MIL/uL — ABNORMAL LOW (ref 4.22–5.81)
RDW: 15.9 % — ABNORMAL HIGH (ref 11.5–15.5)
WBC: 9.3 10*3/uL (ref 4.0–10.5)
nRBC: 0 % (ref 0.0–0.2)

## 2021-04-01 LAB — CBG MONITORING, ED
Glucose-Capillary: 156 mg/dL — ABNORMAL HIGH (ref 70–99)
Glucose-Capillary: 118 mg/dL — ABNORMAL HIGH (ref 70–99)
Glucose-Capillary: 98 mg/dL (ref 70–99)

## 2021-04-01 LAB — STREP PNEUMONIAE URINARY ANTIGEN: Strep Pneumo Urinary Antigen: NEGATIVE

## 2021-04-01 LAB — RESP PANEL BY RT-PCR (FLU A&B, COVID) ARPGX2
Influenza A by PCR: NEGATIVE
Influenza B by PCR: NEGATIVE
SARS Coronavirus 2 by RT PCR: NEGATIVE

## 2021-04-01 LAB — MRSA NEXT GEN BY PCR, NASAL: MRSA by PCR Next Gen: NOT DETECTED

## 2021-04-01 LAB — TROPONIN I (HIGH SENSITIVITY)
Troponin I (High Sensitivity): 104 ng/L (ref <18)
Troponin I (High Sensitivity): 99 ng/L — ABNORMAL HIGH (ref <18)

## 2021-04-01 MED ORDER — LATANOPROST 0.005 % OP SOLN
1.0000 [drp] | Freq: Every day | OPHTHALMIC | Status: DC
  Administered 2021-04-01 – 2021-04-13 (×13): 1 [drp] via OPHTHALMIC
  Filled 2021-04-01 (×3): qty 2.5

## 2021-04-01 MED ORDER — DORZOLAMIDE HCL 2 % OP SOLN
1.0000 [drp] | Freq: Two times a day (BID) | OPHTHALMIC | Status: DC
  Administered 2021-04-01 – 2021-04-14 (×26): 1 [drp] via OPHTHALMIC
  Filled 2021-04-01 (×3): qty 10

## 2021-04-01 MED ORDER — MORPHINE SULFATE (PF) 4 MG/ML IV SOLN: 4.0000 mg | Freq: Once | INTRAVENOUS | Status: DC

## 2021-04-01 MED ORDER — SENNOSIDES-DOCUSATE SODIUM 8.6-50 MG PO TABS: 1.0000 | ORAL_TABLET | Freq: Two times a day (BID) | ORAL | Status: DC | PRN

## 2021-04-01 MED ORDER — ENOXAPARIN SODIUM 30 MG/0.3ML IJ SOSY
30.0000 mg | PREFILLED_SYRINGE | INTRAMUSCULAR | Status: DC
  Administered 2021-04-01: 30 mg via SUBCUTANEOUS
  Filled 2021-04-01: qty 0.3

## 2021-04-01 MED ORDER — HYDRALAZINE HCL 20 MG/ML IJ SOLN
5.0000 mg | INTRAMUSCULAR | Status: DC | PRN
  Administered 2021-04-02 (×2): 5 mg via INTRAVENOUS
  Filled 2021-04-01 (×2): qty 1

## 2021-04-01 MED ORDER — SODIUM CHLORIDE 0.9% FLUSH
3.0000 mL | Freq: Two times a day (BID) | INTRAVENOUS | Status: DC
  Administered 2021-04-01 – 2021-04-14 (×24): 3 mL via INTRAVENOUS

## 2021-04-01 MED ORDER — NITROGLYCERIN IN D5W 200-5 MCG/ML-% IV SOLN
0.0000 ug/min | INTRAVENOUS | Status: DC
Start: 2021-04-01 — End: 2021-04-02
  Administered 2021-04-01: 5 ug/min via INTRAVENOUS
  Administered 2021-04-02: 110 ug/min via INTRAVENOUS
  Filled 2021-04-01 (×2): qty 250

## 2021-04-01 MED ORDER — AMLODIPINE BESYLATE 10 MG PO TABS
10.0000 mg | ORAL_TABLET | Freq: Every day | ORAL | Status: DC
  Administered 2021-04-02 – 2021-04-14 (×13): 10 mg via ORAL
  Filled 2021-04-01 (×7): qty 1
  Filled 2021-04-01: qty 2
  Filled 2021-04-01 (×5): qty 1

## 2021-04-01 MED ORDER — IPRATROPIUM BROMIDE 0.02 % IN SOLN: 0.5000 mg | Freq: Once | RESPIRATORY_TRACT | Status: DC

## 2021-04-01 MED ORDER — ACETAMINOPHEN 650 MG RE SUPP: 650.0000 mg | Freq: Four times a day (QID) | RECTAL | Status: DC | PRN

## 2021-04-01 MED ORDER — INSULIN ASPART 100 UNIT/ML IJ SOLN
0.0000 [IU] | Freq: Three times a day (TID) | INTRAMUSCULAR | Status: DC
  Administered 2021-04-02: 2 [IU] via SUBCUTANEOUS
  Administered 2021-04-02: 1 [IU] via SUBCUTANEOUS
  Administered 2021-04-03: 2 [IU] via SUBCUTANEOUS
  Administered 2021-04-03 – 2021-04-04 (×2): 1 [IU] via SUBCUTANEOUS
  Administered 2021-04-04 – 2021-04-05 (×2): 2 [IU] via SUBCUTANEOUS
  Administered 2021-04-05: 1 [IU] via SUBCUTANEOUS
  Administered 2021-04-06: 5 [IU] via SUBCUTANEOUS
  Administered 2021-04-07: 3 [IU] via SUBCUTANEOUS
  Administered 2021-04-07 – 2021-04-09 (×3): 2 [IU] via SUBCUTANEOUS
  Administered 2021-04-10: 1 [IU] via SUBCUTANEOUS
  Administered 2021-04-10 – 2021-04-11 (×3): 2 [IU] via SUBCUTANEOUS
  Administered 2021-04-12 (×2): 1 [IU] via SUBCUTANEOUS
  Administered 2021-04-13: 2 [IU] via SUBCUTANEOUS
  Administered 2021-04-14: 3 [IU] via SUBCUTANEOUS

## 2021-04-01 MED ORDER — VANCOMYCIN HCL 1500 MG/300ML IV SOLN
1500.0000 mg | Freq: Once | INTRAVENOUS | Status: AC
  Administered 2021-04-01: 1500 mg via INTRAVENOUS
  Filled 2021-04-01: qty 300

## 2021-04-01 MED ORDER — OXYCODONE HCL 5 MG PO TABS
5.0000 mg | ORAL_TABLET | ORAL | Status: DC | PRN
  Administered 2021-04-01 – 2021-04-12 (×7): 5 mg via ORAL
  Filled 2021-04-01 (×7): qty 1

## 2021-04-01 MED ORDER — FLEET ENEMA 7-19 GM/118ML RE ENEM
1.0000 | ENEMA | Freq: Once | RECTAL | Status: DC | PRN
  Filled 2021-04-01: qty 1

## 2021-04-01 MED ORDER — INSULIN GLARGINE 100 UNIT/ML SOLOSTAR PEN: 10.0000 [IU] | PEN_INJECTOR | Freq: Every day | SUBCUTANEOUS | Status: DC

## 2021-04-01 MED ORDER — HYDRALAZINE HCL 50 MG PO TABS
100.0000 mg | ORAL_TABLET | Freq: Three times a day (TID) | ORAL | Status: DC
  Administered 2021-04-02 – 2021-04-14 (×38): 100 mg via ORAL
  Filled 2021-04-01 (×38): qty 2

## 2021-04-01 MED ORDER — CARVEDILOL 12.5 MG PO TABS: 12.5000 mg | ORAL_TABLET | Freq: Two times a day (BID) | ORAL | Status: DC

## 2021-04-01 MED ORDER — VANCOMYCIN HCL 750 MG/150ML IV SOLN: 750.0000 mg | INTRAVENOUS | Status: DC

## 2021-04-01 MED ORDER — VANCOMYCIN HCL 1500 MG/300ML IV SOLN
1500.0000 mg | Freq: Once | INTRAVENOUS | Status: DC
  Filled 2021-04-01: qty 300

## 2021-04-01 MED ORDER — FUROSEMIDE 10 MG/ML IJ SOLN
80.0000 mg | Freq: Two times a day (BID) | INTRAMUSCULAR | Status: DC
  Administered 2021-04-01 – 2021-04-03 (×5): 80 mg via INTRAVENOUS
  Filled 2021-04-01 (×5): qty 8

## 2021-04-01 MED ORDER — SODIUM CHLORIDE 0.9 % IV SOLN
2.0000 g | INTRAVENOUS | Status: DC
  Administered 2021-04-02 – 2021-04-06 (×5): 2 g via INTRAVENOUS
  Filled 2021-04-01 (×5): qty 2

## 2021-04-01 MED ORDER — POLYETHYLENE GLYCOL 3350 17 G PO PACK
17.0000 g | PACK | Freq: Every day | ORAL | Status: DC | PRN
  Administered 2021-04-02: 17 g via ORAL
  Filled 2021-04-01: qty 1

## 2021-04-01 MED ORDER — ALBUTEROL SULFATE (2.5 MG/3ML) 0.083% IN NEBU: 10.0000 mg/h | INHALATION_SOLUTION | Freq: Once | RESPIRATORY_TRACT | Status: DC

## 2021-04-01 MED ORDER — FUROSEMIDE 10 MG/ML IJ SOLN: 40.0000 mg | Freq: Two times a day (BID) | INTRAMUSCULAR | Status: DC

## 2021-04-01 MED ORDER — INSULIN GLARGINE-YFGN 100 UNIT/ML ~~LOC~~ SOLN
10.0000 [IU] | Freq: Every day | SUBCUTANEOUS | Status: DC
  Administered 2021-04-02 – 2021-04-03 (×2): 10 [IU] via SUBCUTANEOUS
  Filled 2021-04-01 (×3): qty 0.1

## 2021-04-01 MED ORDER — SODIUM CHLORIDE 0.9 % IV SOLN
2.0000 g | Freq: Once | INTRAVENOUS | Status: AC
  Administered 2021-04-01: 2 g via INTRAVENOUS
  Filled 2021-04-01: qty 2

## 2021-04-01 MED ORDER — CARVEDILOL 25 MG PO TABS
25.0000 mg | ORAL_TABLET | Freq: Two times a day (BID) | ORAL | Status: DC
Start: 2021-04-01 — End: 2021-04-14
  Administered 2021-04-02 – 2021-04-14 (×23): 25 mg via ORAL
  Filled 2021-04-01 (×2): qty 1
  Filled 2021-04-01: qty 2
  Filled 2021-04-01 (×22): qty 1

## 2021-04-01 MED ORDER — ASPIRIN EC 81 MG PO TBEC
81.0000 mg | DELAYED_RELEASE_TABLET | Freq: Every day | ORAL | Status: DC
  Administered 2021-04-02: 81 mg via ORAL
  Filled 2021-04-01: qty 1

## 2021-04-01 MED ORDER — BISACODYL 5 MG PO TBEC: 5.0000 mg | DELAYED_RELEASE_TABLET | Freq: Every day | ORAL | Status: DC | PRN

## 2021-04-01 MED ORDER — PANTOPRAZOLE SODIUM 40 MG PO TBEC: 40.0000 mg | DELAYED_RELEASE_TABLET | Freq: Two times a day (BID) | ORAL | Status: DC

## 2021-04-01 MED ORDER — ACETAMINOPHEN 325 MG PO TABS
650.0000 mg | ORAL_TABLET | Freq: Four times a day (QID) | ORAL | Status: DC | PRN
  Administered 2021-04-11 – 2021-04-14 (×3): 650 mg via ORAL
  Filled 2021-04-01 (×3): qty 2

## 2021-04-01 MED ORDER — DOXAZOSIN MESYLATE 2 MG PO TABS: 2.0000 mg | ORAL_TABLET | Freq: Every day | ORAL | Status: DC

## 2021-04-01 MED ORDER — ATORVASTATIN CALCIUM 20 MG PO TABS
20.0000 mg | ORAL_TABLET | Freq: Every day | ORAL | Status: DC
  Administered 2021-04-02 – 2021-04-14 (×13): 20 mg via ORAL
  Filled 2021-04-01: qty 1
  Filled 2021-04-01 (×6): qty 2
  Filled 2021-04-01: qty 1
  Filled 2021-04-01 (×2): qty 2
  Filled 2021-04-01: qty 1
  Filled 2021-04-01 (×2): qty 2
  Filled 2021-04-01: qty 1

## 2021-04-01 MED ORDER — BRIMONIDINE TARTRATE 0.2 % OP SOLN
1.0000 [drp] | Freq: Two times a day (BID) | OPHTHALMIC | Status: DC
  Administered 2021-04-01 – 2021-04-14 (×26): 1 [drp] via OPHTHALMIC
  Filled 2021-04-01 (×2): qty 5

## 2021-04-01 MED ORDER — DOCUSATE SODIUM 100 MG PO CAPS
200.0000 mg | ORAL_CAPSULE | Freq: Two times a day (BID) | ORAL | Status: DC
  Administered 2021-04-02 – 2021-04-13 (×18): 200 mg via ORAL
  Filled 2021-04-01 (×21): qty 2

## 2021-04-01 NOTE — ED Provider Notes (Signed)
   MDM    1M hx of CHF, here with hypoxia, RUL PNA, concern for multilobar PNA, possible fluid overload from CHF. Getting broad ABX and will be admitted once workup results.    On my assumption of care, the patient was hypertensive on BiPAP.  Physical exam significant for bilateral rails concerning for pulmonary edema.  Patient presents in respiratory distress with acute hypoxic respiratory failure in the setting of CHF exacerbation.  BNP resulted elevated to 1587.  Troponins pending.  Patient states that his breathing is improved with assistance from from BiPAP.  Given the patient's marked hypertension, hypoxic respiratory failure in the setting of CHF, rales on exam concerning for pulmonary edema, will start the patient on a nitro drip.  Hospitalist medicine consulted for admission.  Patient subsequently admitted on a nitro gtt and BiPAP.  Marland KitchenCritical Care Performed by: Regan Lemming, MD Authorized by: Regan Lemming, MD   Critical care provider statement:    Critical care time (minutes):  35   Critical care was necessary to treat or prevent imminent or life-threatening deterioration of the following conditions:  Cardiac failure and circulatory failure   Critical care was time spent personally by me on the following activities:  Discussions with consultants, evaluation of patient's response to treatment, examination of patient, ordering and performing treatments and interventions, ordering and review of laboratory studies, ordering and review of radiographic studies, pulse oximetry, re-evaluation of patient's condition, obtaining history from patient or surrogate and review of old charts       Regan Lemming, MD 04/01/21 [phone]

## 2021-04-01 NOTE — Assessment & Plan Note (Signed)
-  A1c was 5.8 in July -Continue glargine -Cover with sensitive-scale SSI

## 2021-04-01 NOTE — Consult Note (Addendum)
Cardiology Consultation:   Patient ID: Robert Chaney MRN: [id number]; DOB: August 04, 1950  Admit date: 04/01/2021 Date of Consult: 04/01/2021  PCP:  [name], MD   [facility] Providers Cardiologist:  Skeet Latch, MD        Patient Profile:   Robert Chaney is a 70 y.o. male with a hx of diastolic CHF, HOCM, OSA on CPAP, CKD-4, DM-2, chronic hypoxic RF on 4 L, HTN, HLD, anemia,  persistent RML infiltrate on chest imaging, and recent hospitalization x 2 for CHF, who is being seen 04/01/2021 for the evaluation of CHF at the request of Dr Lorin Mercy.  History of Present Illness:   Robert Chaney was admitted 09/07-09/17/2022 for SOB, tarry stools, fatigue. Dx acute on chronic resp failure, RML inf/HFpEF, anemia.   Admitted  09/23-09/29/2022 for Acute on chronic hypoxic RF due to acute cardiogenic pulmonary edema in the setting of acute on chronic diastolic heart failure and hypertensive emergency.Initially req BiPAP, IV ABX, IV Lasix. Seen by Nephrology, after diuresis, diuretics held, d/c wt 160 lbs, d/c Cr 3.38, resume Lasix 40 mg bid in 2 days. TTE w/ possible intrapulmonary shunt, f/u as outpt.  ER stay 10/01-02 due to running out of Oxygen because the power was out. Once the power came back on, he went home.   He was admitted early this am after he developed acute SOB and CP. O2 sats initially 69%, placed on BiPAP, nitro gtt started since pt BP 222/89 on admit. Started on Lasix 40 mg IV bid. K+ 6.0, BUN/Cr 64/2.83, H&H 7.7/26.5, trop 104, covid neg, BNP 1586.   Robert Chaney feels that the reason for his repeated admissions and problems with fluid and blood pressure are from his constipation.  He states he has not had a bowel movement in 11 days.  He states that when he does have a bowel movement it is a mixture of dark blood and bright red blood.  He denies hard stools.  He says his breathing got bad fairly suddenly during the night.  He says he is compliant with his medications.  He does not  think he is using too much salt or drinking too much liquids.  He is currently on BiPAP, and breathing better on that than he was earlier.   Past Medical History:  Diagnosis Date   Altered mental status    a. 05/2017 - adm with blurred vision, somnolence in setting of AKI and high blood sugar.   Anemia    CKD (chronic kidney disease), stage III (HCC)    Diabetes mellitus    Diastolic CHF, chronic ([place])    A.  03/2009 Echo: EF 60-65%, Gr II diast dysfxn   Elevated troponin    a. 2013 - troponin 1.4. b. 2019 - troponin 0.32; neg nuc 07/2017.   Glaucoma    Hyperlipidemia    HYPERCHOLESTEROLEMIA   Hypertension    MARKED LEFT VENTRICULAR HYPERTROPHY BY PREVIOUS ECHOCARDIOGRAM--HE HAS HYPERDYNAMIC LEFT VENTRICULAR SYSTOLIC FUNCTION AND HAS IMPAIRED RELAXATION BY ECHO   Hypertrophic cardiomyopathy (HCC)    NSVT (nonsustained ventricular tachycardia)    Premature atrial contractions    PVC's (premature ventricular contractions)    SVT (supraventricular tachycardia) ([place])     Past Surgical History:  Procedure Laterality Date   BIOPSY  03/03/2021   Procedure: BIOPSY;  Surgeon: Jackquline Denmark, MD;  Location: [place];  Service: Endoscopy;;   BRONCHIAL BIOPSY  03/05/2021   Procedure: BRONCHIAL BIOPSIES;  Surgeon: Candee Furbish, MD;  Location: [place];  Service: Pulmonary;;   BRONCHIAL BRUSHINGS  03/05/2021   Procedure: BRONCHIAL BRUSHINGS;  Surgeon: Candee Furbish, MD;  Location[facility] ENDOSCOPY;  Service: Pulmonary;;   BRONCHIAL WASHINGS  03/05/2021   Procedure: BRONCHIAL WASHINGS;  Surgeon: Candee Furbish, MD;  Location: [facility];  Service: Pulmonary;;   ESOPHAGOGASTRODUODENOSCOPY (EGD) WITH PROPOFOL N/A 03/03/2021   Procedure: ESOPHAGOGASTRODUODENOSCOPY (EGD) WITH PROPOFOL;  Surgeon: Jackquline Denmark, MD;  Location: [facility] ENDOSCOPY;  Service: Endoscopy;  Laterality: N/A;   HEMOSTASIS CONTROL  03/05/2021   Procedure: HEMOSTASIS CONTROL;  Surgeon: Candee Furbish, MD;  Location: [facility]  ENDOSCOPY;  Service: Pulmonary;;   NO PAST SURGERIES     VIDEO BRONCHOSCOPY N/A 03/05/2021   Procedure: VIDEO BRONCHOSCOPY WITH FLUORO;  Surgeon: Candee Furbish, MD;  Location: [facility] ENDOSCOPY;  Service: Pulmonary;  Laterality: N/A;     Home Medications:  Prior to Admission medications   Medication Sig Start Date End Date Taking? Authorizing Provider  amLODipine (NORVASC) 10 MG tablet Take 1 tablet (10 mg total) by mouth daily. 05/13/20  Yes Shawna Clamp, MD  aspirin EC 81 MG tablet Take 1 tablet (81 mg total) by mouth daily. Swallow whole. 03/02/20  Yes Bhagat, Bhavinkumar, PA  atorvastatin (LIPITOR) 20 MG tablet Take 1 tablet (20 mg total) by mouth daily. 03/16/18  Yes Dorothy Spark, MD  brimonidine (ALPHAGAN) 0.2 % ophthalmic solution Place 1 drop into the right eye 2 (two) times daily. 10/06/19  Yes [provider]  carvedilol (COREG) 12.5 MG tablet Take 1 tablet (12.5 mg total) by mouth 2 (two) times daily. 03/10/21 04/09/21 Yes Thurnell Lose, MD  cholecalciferol (VITAMIN D3) 25 MCG (1000 UT) tablet Take 1,000 Units by mouth daily.   Yes [provider]  docusate sodium (COLACE) 100 MG capsule Take 2 capsules (200 mg total) by mouth 2 (two) times daily. 03/10/21  Yes Thurnell Lose, MD  dorzolamide (TRUSOPT) 2 % ophthalmic solution Place 1 drop into both eyes 2 (two) times daily. 12/04/20  Yes [provider]  furosemide (LASIX) 40 MG tablet Take 1 tablet (40 mg total) by mouth 2 (two) times daily. 03/24/21  Yes Mercy Riding, MD  hydrALAZINE (APRESOLINE) 100 MG tablet Take 1 tablet (100 mg total) by mouth 3 (three) times daily. 03/16/20  Yes Skeet Latch, MD  insulin glargine (LANTUS SOLOSTAR) 100 UNIT/ML Solostar Pen Inject 10 Units into the skin daily. 03/22/21  Yes Mercy Riding, MD  isosorbide mononitrate (IMDUR) 60 MG 24 hr tablet Take 60 mg by mouth daily. 04/27/20  Yes [provider]  latanoprost (XALATAN) 0.005 % ophthalmic solution Place 1  drop into the left eye at bedtime. 10/06/19  Yes [provider]  nitroGLYCERIN (NITROSTAT) 0.4 MG SL tablet DISSOLVE ONE TABLET UNDER THE TONGUE EVERY 5 MINUTES AS NEEDED FOR CHEST PAIN. Patient taking differently: Place 0.4 mg under the tongue every 5 (five) minutes as needed for chest pain. 03/27/20  Yes Dorothy Spark, MD  Continuous Blood Gluc Sensor (DEXCOM G6 SENSOR) MISC 1 Device by Does not apply route as directed. 01/12/21   Shamleffer, Melanie Crazier, MD  Continuous Blood Gluc Transmit (DEXCOM G6 TRANSMITTER) MISC 1 Device by Does not apply route as directed. 01/12/21   Shamleffer, Melanie Crazier, MD  glucose blood (ONETOUCH VERIO) test strip USE 1 STRIP TO CHECK GLUCOSE THREE TIMES DAILY AS DIRECTED 07/24/20   Shamleffer, Melanie Crazier, MD  Insulin Pen Needle 32G X 4 MM MISC Use as directed with insulin 03/22/21  Mercy Riding, MD  pantoprazole (PROTONIX) 40 MG tablet Take 1 tablet (40 mg total) by mouth 2 (two) times daily before a meal. Patient not taking: Reported on 04/01/2021 03/10/21   Thurnell Lose, MD  polyethylene glycol (MIRALAX) 17 g packet Take 17 g by mouth daily. Patient not taking: Reported on 04/01/2021 03/10/21   Thurnell Lose, MD  senna-docusate (SENOKOT-S) 8.6-50 MG tablet Take 1 tablet by mouth 2 (two) times daily between meals as needed for mild constipation. Patient not taking: Reported on 04/01/2021 03/22/21   Mercy Riding, MD    Inpatient Medications: Scheduled Meds:  amLODipine  10 mg Oral Daily   aspirin EC  81 mg Oral Daily   atorvastatin  20 mg Oral Daily   brimonidine  1 drop Right Eye BID   carvedilol  12.5 mg Oral BID   docusate sodium  200 mg Oral BID   dorzolamide  1 drop Both Eyes BID   enoxaparin (LOVENOX) injection  30 mg Subcutaneous Q24H   furosemide  40 mg Intravenous BID   hydrALAZINE  100 mg Oral TID   insulin aspart  0-9 Units Subcutaneous TID WC   insulin glargine-yfgn  10 Units Subcutaneous Daily   latanoprost  1 drop  Left Eye QHS   sodium chloride flush  3 mL Intravenous Q12H   Continuous Infusions:  [START ON 04/02/2021] ceFEPime (MAXIPIME) IV     nitroGLYCERIN 25 mcg/min (04/01/21 0938)   vancomycin     [START ON 04/03/2021] vancomycin     PRN Meds: acetaminophen **OR** acetaminophen, bisacodyl, hydrALAZINE, oxyCODONE, polyethylene glycol, sodium phosphate  Allergies:   No Active Allergies  Social History:   Social History   Socioeconomic History   Marital status: Legally Separated    Spouse name: Not on file   Number of children: Not on file   Years of education: Not on file   Highest education level: Not on file  Occupational History   Occupation: retired    Fish farm manager: [employer]    Comment: At [place] Use   Smoking status: Never   Smokeless tobacco: Never  Vaping Use   Vaping Use: Never used  Substance and Sexual Activity   Alcohol use: No   Drug use: No   Sexual activity: Never  Other Topics Concern   Not on file  Social History Narrative   Originally from [place].    Social Determinants of Health   Financial Resource Strain: Not on file  Food Insecurity: Not on file  Transportation Needs: Not on file  Physical Activity: Not on file  Stress: Not on file  Social Connections: Not on file  Intimate Partner Violence: Not on file    Family History:   Family History  Problem Relation Age of Onset   Hypertension Father    Stroke Father    Hypertension Mother    Diabetes Brother    Hypertension Brother    Diabetes Brother      ROS:  Please see the history of present illness.  All other ROS reviewed and negative.     Physical Exam/Data:   Vitals:   04/01/21 1000 04/01/21 1045 04/01/21 1145 04/01/21 1201  BP: (!) 119/94 (!) 137/99 (!) 116/95   Pulse: 72 75 69   Resp: 20 (!) 22 19   Temp:      SpO2: 96% 98% 95% 93%  Weight:      Height:        Intake/Output Summary (  Last 24 hours) at 04/01/2021 1208 Last data filed at 04/01/2021  2751 Gross per 24 hour  Intake 8.52 ml  Output --  Net 8.52 ml   Last 3 Weights 04/01/2021 03/24/2021 03/22/2021  Weight (lbs) 162 lb 14.7 oz 163 lb 160 lb 11.2 oz  Weight (kg) 73.9 kg 73.936 kg 72.893 kg     Body mass index is 24.77 kg/m.  General:  Well nourished, well developed, in acute distress on BiPAP HEENT: normal Neck: JVD to jaw Vascular: No carotid bruits; Distal pulses 2+ bilaterally Cardiac:  normal S1, S2; RRR; no murmur heard, but heart sounds tend to be obscured by lung sounds Lungs: Dense Rales to auscultation bilaterally, no wheezing, rhonchi  Abd: Firm, distended, tender, no hepatomegaly  Ext: 1+ edema Musculoskeletal:  No deformities, BUE and BLE strength normal and equal Skin: warm and dry  Neuro:  CNs 2-12 intact, no focal abnormalities noted Psych:  Normal affect   EKG:  The EKG was personally reviewed and demonstrates: Sinus rhythm, heart rate 67, mild ST scooping in inferior leads, LVH, no significant change from 9/23 Telemetry:  Telemetry was personally reviewed and demonstrates: Sinus rhythm  Relevant CV Studies:  ECHO, LIMITED: for hypoxia. 03/19/2021  1. Limited echo for shunting in the setting of hypoxia   2. Agitated saline contrast bubble study was positive with shunting  observed after >6 cardiac cycles suggestive of intrapulmonary shunting. A  moderate number of microbubbles were noted, consistent with a more  significant shunt.   3. Left ventricular ejection fraction, by estimation, is 65 to 70%. The  left ventricle has normal function.   Comparison(s): Changes from prior study are noted. 01/30/2021: LVEF 70-75%.   ECHO: 01/30/2021  1. Peak outflow gradient velocity 3.35 m/s, peak gradient 45 mmHg. Left  ventricular ejection fraction, by estimation, is 70 to 75%. The left  ventricle has hyperdynamic function. The left ventricle has no regional  wall motion abnormalities. There is severe left ventricular hypertrophy. Left ventricular diastolic  parameters are consistent with Grade I diastolic dysfunction (impaired relaxation).   2. Right ventricular systolic function is normal. The right ventricular  size is normal.   3. The mitral valve is normal in structure. No evidence of mitral valve  regurgitation. No evidence of mitral stenosis.   4. The aortic valve is normal in structure. Aortic valve regurgitation is  not visualized. No aortic stenosis is present.   5. The inferior vena cava is normal in size with greater than 50%  respiratory variability, suggesting right atrial pressure of 3 mmHg.   Conclusion(s)/Recommendation(s): Findings consistent with hypertrophic  obstructive cardiomyopathy.   Laboratory Data:  High Sensitivity Troponin:   Recent Labs  Lab 03/16/21 1953 03/16/21 2154 04/01/21 0615 04/01/21 0824  TROPONINIHS 112* 98* 104* 99*     Chemistry Recent Labs  Lab 04/01/21 0615 04/01/21 0630  NA 142 145  K 6.0* 6.0*  CL 110  --   CO2 26  --   GLUCOSE 252*  --   BUN 64*  --   CREATININE 2.83*  --   CALCIUM 9.1  --   GFRNONAA 23*  --   ANIONGAP 6  --     Lab Results  Component Value Date   ALT 12 03/19/2021   AST 9 (L) 03/19/2021   ALKPHOS 62 03/19/2021   BILITOT 0.8 03/19/2021   Magnesium  Date Value Ref Range Status  03/22/2021 2.6 (H) 1.7 - 2.4 mg/dL Final    Comment:  Performed at [facility], [place] [address] [place], [place] [address]  03/19/2021 2.2 1.7 - 2.4 mg/dL Final    Comment:    Performed at [facility] [address], [place], [place] [address]  03/17/2021 2.3 1.7 - 2.4 mg/dL Final    Comment:    Performed at [facility] [address], [place], [place] [address]    Lipids  Lab Results  Component Value Date   CHOL 127 05/13/2020   HDL 43 05/13/2020   LDLCALC 63 05/13/2020   TRIG 103 05/13/2020   CHOLHDL 3.0 05/13/2020     Hematology Recent Labs  Lab 04/01/21 0615 04/01/21 0630  WBC 9.3  --   RBC 2.68*  --   HGB 7.7* 8.2*  HCT 26.5*  24.0*  MCV 98.9  --   MCH 28.7  --   MCHC 29.1*  --   RDW 15.9*  --   PLT 317  --    Thyroid No results for input(s): TSH, FREET4 in the last 168 hours.  BNP Recent Labs  Lab 04/01/21 0615  BNP 1,586.6*    DDimer No results for input(s): DDIMER in the last 168 hours. Lab Results  Component Value Date   HGBA1C 5.8 (A) 01/12/2021     Radiology/Studies:  [facility] Chest Port 1 View  Result Date: 04/01/2021 CLINICAL DATA:  70 year old male with history of shortness of breath. EXAM: PORTABLE CHEST 1 VIEW COMPARISON:  Chest x-ray 03/20/2021. FINDINGS: Persistent consolidation in the inferior aspect of the right upper lobe. Patchy areas of interstitial prominence and several ill-defined opacities are also now noted elsewhere throughout the lungs bilaterally. Small right pleural effusion. No left pleural effusion. No pneumothorax. No cephalization of the pulmonary vasculature. Heart size is mildly enlarged. The patient is rotated to the right on today's exam, resulting in distortion of the mediastinal contours and reduced diagnostic sensitivity and specificity for mediastinal pathology. Atherosclerotic calcifications in the thoracic aorta. IMPRESSION: 1. Slight worsening consolidation in the right upper lobe, compatible with pneumonia. There is also evidence to suggest developing multilobar bilateral bronchopneumonia, as above. 2. Cardiomegaly. 3. Aortic atherosclerosis. Electronically Signed   By: Vinnie Langton M.D.   On: 04/01/2021 06:25     Assessment and Plan:   Acute on chronic diastolic CHF: -He has been started on Lasix 40 mg IV twice daily, but he has not yet had any - With creatinine 2.83, discuss increasing the dose to 80 mg IV twice daily with MD. - The reasons for his increasingly frequent hospitalizations is unclear - may need R heart cath once breathing better  2. HOCM - need to slow HR down, increase BB  3. Hyperkalemia - Lasix will help - does not have Lokelma or  Kayexalate ordered  4. Constipation w/ reported bloody stools - per pt, no BM 11 days - has been started on Rx - per IM   Risk Assessment/Risk Scores:       New York Heart Association (NYHA) Functional Class NYHA Class IV     For questions or updates, please contact [facility] Please consult [email or web address] for contact info under    Signed, Rosaria Ferries, PA-C  04/01/2021 12:08 PM  Personally seen and examined. Agree with above.  70 year old with known hypertrophic obstructive cardiomyopathy, diastolic heart failure with chronic hypoxia on 4 L of home O2 with persistent right middle lobe infiltrate on chest imaging and recent hospitalizations, OSA on CPAP here with acute respiratory failure.  Currently  in the emergency department somewhat tired appearing with BiPAP in place.  He is still arousable but tired.  His lungs on auscultation bilaterally are severely rhonchorous throughout all lung fields.  Heart is regular rate and rhythm with perhaps a soft systolic murmur, challenging to hear through his lung sounds.  No significant lower extremity edema.  Has known chronic kidney disease stage IV currently with a creatinine of 2.8.  Potassium also elevated, 5.7.  His blood pressure also was severely elevated 222/89 on admit, compatible with hypertensive emergency.  Mildly elevated troponin with demand ischemia.  Constipation, 11 days without bowel movement mixture of bright red blood and dark blood noted.  Fairly acute onset of worsening chronic respiratory failure.  -I think it makes sense for [place] to trial Lasix 80 mg IV twice daily, to promote aggressive initial diuresis.  Watch his creatinine closely.  I would expect some increase.  His hemoglobin also 7.7.  Potassium 5.7, should be helped somewhat with his IV Lasix.  Challenging situation.  We have to also be careful to not over diurese given his hypertrophic obstructive cardiomyopathy.  Quite tenuous.  We will go up on the  carvedilol from 12.5-25 twice a day to hopefully help improve his HOCM hemodynamics.  We will continue to follow along.  CRITICAL CARE Performed by: Candee Furbish   Total critical care time: 40 minutes  Critical care time was exclusive of separately billable procedures and treating other patients.  Critical care was necessary to treat or prevent imminent or life-threatening deterioration.  Critical care was time spent personally by me on the following activities: development of treatment plan with patient and/or surrogate as well as nursing, discussions with consultants, evaluation of patient's response to treatment, examination of patient, obtaining history from patient or surrogate, ordering and performing treatments and interventions, ordering and review of laboratory studies, ordering and review of radiographic studies, pulse oximetry and re-evaluation of patient's condition.   Candee Furbish, MD

## 2021-04-01 NOTE — Assessment & Plan Note (Signed)
-  K+ 6.0 on presentation, known CKD but improved from prior -No peaked T waves noted on EKG -He has been given Lasix -Will recheck STAT BMP now

## 2021-04-01 NOTE — Progress Notes (Signed)
Pharmacy Antibiotic Note  Robert Chaney is a 70 y.o. male admitted on 04/01/2021 with SOB/pneumonia.  Pharmacy has been consulted for Vancomycin  dosing.  Plan: Vancomycin 1500 mg IV now, then 750 mg IV q48h  Height: 5\' 8"  (172.7 cm) Weight: 73.9 kg (162 lb 14.7 oz) IBW/kg (Calculated) : 68.4  Temp (24hrs), Avg:98.1 F (36.7 C), Min:98.1 F (36.7 C), Max:98.1 F (36.7 C)  Recent Labs  Lab 04/01/21 0615  WBC 9.3    Estimated Creatinine Clearance: 19.1 mL/min (A) (by C-G formula based on SCr of 3.38 mg/dL (H)).    Allergies  Allergen Reactions   Aspirin Itching    Patient still takes a baby ASA everyday with no issues  03/17/21 not allergic    Robert Chaney 04/01/2021 7:06 AM

## 2021-04-01 NOTE — ED Notes (Signed)
Mickey Farber cousin [phone] requesting an update

## 2021-04-01 NOTE — ED Notes (Signed)
Mickey Farber cousin [phone] requesting an update

## 2021-04-01 NOTE — ED Notes (Signed)
RT contacted to help trial taking the Pt off of Bipap

## 2021-04-01 NOTE — Assessment & Plan Note (Signed)
-  Patient with cough, mild rhinorrhea but no other infectious symptoms -CXR read as worsening RLL infiltrate and multifocal PNA -Appears to be more c/w CHF and hypertensive crisis, but there may also be a component of PNA -He was given Cefepime and Vanc in the ER, will continue for now -Check procalcitonin level as well as Strep pneumo and Legionella

## 2021-04-01 NOTE — Assessment & Plan Note (Signed)
-  Patient presenting with chest pain and SOB concerning for hypertensive crisis -He did have evidence of end organ failure, with acute respiratory failure, placed on BIPAP -He was started on NTG drip in the ER with subsequent significant decrease in BP without apparent difficulty -Will admit to progressive care unit -Will resume home medications - hydralazine, Coreg, Norvasc -Continue ASA -Will add prn hydralazine

## 2021-04-01 NOTE — H&P (Addendum)
History and Physical    Robert Chaney Robert Chaney DOB: [birth year] DOA: 04/01/2021  PCP: [name], MD Consultants:  [facility] - endocrinology; [facility] - cardiology; Carolin Sicks - nephrology; pulmonlogy Patient coming from:  Home - lives alone; NOK: Gerre Scull [place], [phone]  Chief Complaint: SOB  HPI: Robert Chaney is a 70 y.o. male with medical history significant of stage 4 CKD; DM; chronic diastolic CHF; chronic hypoxic respiratory failure on 4L [place] O2; HTN; and HLD presenting with SOB.  He was last hospitalized from 9/23-29 with acute on chronic respiratory failure associated with CHF and hypertensive crisis, treated with BIPAP and Lasix.   He also had gastritis on 9/10 EGD and negative bronch on 9/12 during prior hospitalization.  He reports that he was doing well at the time of last d/c on 9/29.  He has been compliant with his medications.  Last night, he developed acute onset of CP and SOB.  He was placed on BIPAP in the ER and feels better now.  He has long-standing, poorly-controlled HTN despite medications.    ED Course: Hypertensive, started on NTG drip.  Appears to be CHF picture.  Breathing is improved on BIPAP.  Very similar to last hospitalization.  Review of Systems: As per HPI; otherwise review of systems reviewed and negative.   Ambulatory Status:  Ambulates with a walker  COVID Vaccine Status:   Complete plus booster  Past Medical History:  Diagnosis Date   Altered mental status    a. 05/2017 - adm with blurred vision, somnolence in setting of AKI and high blood sugar.   Anemia    CKD (chronic kidney disease), stage III (HCC)    Diabetes mellitus    Diastolic CHF, chronic ([place])    A.  03/2009 Echo: EF 60-65%, Gr II diast dysfxn   Elevated troponin    a. 2013 - troponin 1.4. b. 2019 - troponin 0.32; neg nuc 07/2017.   Glaucoma    Hyperlipidemia    HYPERCHOLESTEROLEMIA   Hypertension    MARKED LEFT VENTRICULAR HYPERTROPHY BY PREVIOUS  ECHOCARDIOGRAM--HE HAS HYPERDYNAMIC LEFT VENTRICULAR SYSTOLIC FUNCTION AND HAS IMPAIRED RELAXATION BY ECHO   Hypertrophic cardiomyopathy (HCC)    NSVT (nonsustained ventricular tachycardia)    Premature atrial contractions    PVC's (premature ventricular contractions)    SVT (supraventricular tachycardia) ([place])     Past Surgical History:  Procedure Laterality Date   BIOPSY  03/03/2021   Procedure: BIOPSY;  Surgeon: Jackquline Denmark, MD;  Location: [facility];  Service: Endoscopy;;   BRONCHIAL BIOPSY  03/05/2021   Procedure: BRONCHIAL BIOPSIES;  Surgeon: Candee Furbish, MD;  Location[facility] ENDOSCOPY;  Service: Pulmonary;;   BRONCHIAL BRUSHINGS  03/05/2021   Procedure: BRONCHIAL BRUSHINGS;  Surgeon: Candee Furbish, MD;  Location[facility] ENDOSCOPY;  Service: Pulmonary;;   BRONCHIAL WASHINGS  03/05/2021   Procedure: BRONCHIAL WASHINGS;  Surgeon: Candee Furbish, MD;  Location: [facility] ENDOSCOPY;  Service: Pulmonary;;   ESOPHAGOGASTRODUODENOSCOPY (EGD) WITH PROPOFOL N/A 03/03/2021   Procedure: ESOPHAGOGASTRODUODENOSCOPY (EGD) WITH PROPOFOL;  Surgeon: Jackquline Denmark, MD;  Location: [facility] ENDOSCOPY;  Service: Endoscopy;  Laterality: N/A;   HEMOSTASIS CONTROL  03/05/2021   Procedure: HEMOSTASIS CONTROL;  Surgeon: Candee Furbish, MD;  Location: [facility] ENDOSCOPY;  Service: Pulmonary;;   NO PAST SURGERIES     VIDEO BRONCHOSCOPY N/A 03/05/2021   Procedure: VIDEO BRONCHOSCOPY WITH FLUORO;  Surgeon: Candee Furbish, MD;  Location: [facility] ENDOSCOPY;  Service: Pulmonary;  Laterality: N/A;    Social History  Socioeconomic History   Marital status: Legally Separated    Spouse name: Not on file   Number of children: Not on file   Years of education: Not on file   Highest education level: Not on file  Occupational History   Occupation: retired    Fish farm manager: [employer]    Comment: At [employer] Use   Smoking status: Never   Smokeless tobacco: Never  Vaping Use   Vaping Use: Never used  Substance and  Sexual Activity   Alcohol use: No   Drug use: No   Sexual activity: Never  Other Topics Concern   Not on file  Social History Narrative   Originally from [place].    Social Determinants of Health   Financial Resource Strain: Not on file  Food Insecurity: Not on file  Transportation Needs: Not on file  Physical Activity: Not on file  Stress: Not on file  Social Connections: Not on file  Intimate Partner Violence: Not on file    No Active Allergies   Family History  Problem Relation Age of Onset   Hypertension Father    Stroke Father    Hypertension Mother    Diabetes Brother    Hypertension Brother    Diabetes Brother     Prior to Admission medications   Medication Sig Start Date End Date Taking? Authorizing Provider  amLODipine (NORVASC) 10 MG tablet Take 1 tablet (10 mg total) by mouth daily. 05/13/20   Shawna Clamp, MD  aspirin EC 81 MG tablet Take 1 tablet (81 mg total) by mouth daily. Swallow whole. 03/02/20   Bhagat, Crista Luria, PA  atorvastatin (LIPITOR) 20 MG tablet Take 1 tablet (20 mg total) by mouth daily. 03/16/18   Dorothy Spark, MD  brimonidine (ALPHAGAN) 0.2 % ophthalmic solution Place 1 drop into the right eye 2 (two) times daily. 10/06/19   [provider]  carvedilol (COREG) 12.5 MG tablet Take 1 tablet (12.5 mg total) by mouth 2 (two) times daily. 03/10/21 04/09/21  Thurnell Lose, MD  cholecalciferol (VITAMIN D3) 25 MCG (1000 UT) tablet Take 1,000 Units by mouth daily.    [provider]  Continuous Blood Gluc Sensor (DEXCOM G6 SENSOR) MISC 1 Device by Does not apply route as directed. 01/12/21   Shamleffer, Melanie Crazier, MD  Continuous Blood Gluc Transmit (DEXCOM G6 TRANSMITTER) MISC 1 Device by Does not apply route as directed. 01/12/21   Shamleffer, Melanie Crazier, MD  diclofenac Sodium (VOLTAREN) 1 % GEL Apply 4 g topically 4 (four) times daily as needed for pain. 02/07/20   [provider]  docusate sodium  (COLACE) 100 MG capsule Take 2 capsules (200 mg total) by mouth 2 (two) times daily. 03/10/21   Thurnell Lose, MD  dorzolamide (TRUSOPT) 2 % ophthalmic solution Place 1 drop into both eyes 2 (two) times daily. 12/04/20   [provider]  doxazosin (CARDURA) 2 MG tablet Take 2 mg by mouth daily. Patient not sure if still taking    [provider]  furosemide (LASIX) 40 MG tablet Take 1 tablet (40 mg total) by mouth 2 (two) times daily. 03/24/21   Mercy Riding, MD  glucose blood (ONETOUCH VERIO) test strip USE 1 STRIP TO CHECK GLUCOSE THREE TIMES DAILY AS DIRECTED 07/24/20   Shamleffer, Melanie Crazier, MD  hydrALAZINE (APRESOLINE) 100 MG tablet Take 1 tablet (100 mg total) by mouth 3 (three) times daily. 03/16/20   Skeet Latch, MD  insulin glargine (LANTUS  SOLOSTAR) 100 UNIT/ML Solostar Pen Inject 10 Units into the skin daily. 03/22/21   Mercy Riding, MD  Insulin Pen Needle 32G X 4 MM MISC Use as directed with insulin 03/22/21   Mercy Riding, MD  isosorbide mononitrate (IMDUR) 60 MG 24 hr tablet Take 60 mg by mouth daily. 04/27/20   [provider]  latanoprost (XALATAN) 0.005 % ophthalmic solution Place 1 drop into the left eye at bedtime. 10/06/19   [provider]  nitroGLYCERIN (NITROSTAT) 0.4 MG SL tablet DISSOLVE ONE TABLET UNDER THE TONGUE EVERY 5 MINUTES AS NEEDED FOR CHEST PAIN. Patient taking differently: Place 0.4 mg under the tongue every 5 (five) minutes as needed for chest pain. 03/27/20   Dorothy Spark, MD  pantoprazole (PROTONIX) 40 MG tablet Take 1 tablet (40 mg total) by mouth 2 (two) times daily before a meal. 03/10/21   Thurnell Lose, MD  polyethylene glycol (MIRALAX) 17 g packet Take 17 g by mouth daily. 03/10/21   Thurnell Lose, MD  senna-docusate (SENOKOT-S) 8.6-50 MG tablet Take 1 tablet by mouth 2 (two) times daily between meals as needed for mild constipation. 03/22/21   Mercy Riding, MD    Physical Exam: Vitals:   04/01/21  0955 04/01/21 1000 04/01/21 1045 04/01/21 1145  BP: (!) 104/93 (!) 119/94 (!) 137/99 (!) 116/95  Pulse: 65 72 75 69  Resp: 16 20 (!) 22 19  Temp:      SpO2: 94% 96% 98% 95%  Weight:      Height:         General:  Appears calm and comfortable and is in NAD, on BIPAP but able to talk normally with BIPAP in place Eyes:  EOMI, normal lids, iris ENT:  grossly normal hearing, lips & tongue, mmm; BIPAP in place Neck:  no LAD, masses or thyromegaly Cardiovascular:  RRR, no m/r/g. 2+ LE edema.  Respiratory:   Bibasilar crackles with scattered rhonchi.  Normal  to mildly increased respiratory effort on BIPAP. Abdomen:  soft, NT, ND Skin:  no rash or induration seen on limited exam Musculoskeletal:  grossly normal tone BUE/BLE, good ROM, no bony abnormality Lower extremity:  No LE edema.  Limited foot exam with no ulcerations.  2+ distal pulses.  Extraordinarily long toenails, very thick. Psychiatric:  grossly normal mood and affect, speech fluent and appropriate, AOx3 Neurologic:  CN 2-12 grossly intact, moves all extremities in coordinated fashion    Radiological Exams on Admission: Independently reviewed - see discussion in A/P where applicable  DG Chest Port 1 View  Result Date: 04/01/2021 CLINICAL DATA:  70 year old male with history of shortness of breath. EXAM: PORTABLE CHEST 1 VIEW COMPARISON:  Chest x-ray 03/20/2021. FINDINGS: Persistent consolidation in the inferior aspect of the right upper lobe. Patchy areas of interstitial prominence and several ill-defined opacities are also now noted elsewhere throughout the lungs bilaterally. Small right pleural effusion. No left pleural effusion. No pneumothorax. No cephalization of the pulmonary vasculature. Heart size is mildly enlarged. The patient is rotated to the right on today's exam, resulting in distortion of the mediastinal contours and reduced diagnostic sensitivity and specificity for mediastinal pathology. Atherosclerotic  calcifications in the thoracic aorta. IMPRESSION: 1. Slight worsening consolidation in the right upper lobe, compatible with pneumonia. There is also evidence to suggest developing multilobar bilateral bronchopneumonia, as above. 2. Cardiomegaly. 3. Aortic atherosclerosis. Electronically Signed   By: Vinnie Langton M.D.   On: 04/01/2021 06:25    EKG: Independently  reviewed.  NSR with rate 67; nonspecific ST changes with no evidence of acute ischemia   Labs on Admission: I have personally reviewed the available labs and imaging studies at the time of the admission.  Pertinent labs:   VBG: 7.308/52.7/26.4 K+ 6.0 Glucose 252 BUN 64/Creatinine 2.83/GFR 23; improved from 70/2.28/19 on 9/29 BNP 1586.6; 909.5 on 9/29 HS troponin 104, 99 WBC 9.3 Hgb 7.7 COVID/flu negative   Assessment/Plan Principal Problem:   Acute on chronic diastolic CHF (congestive heart failure) (HCC) Active Problems:   Hypertensive crisis   Multifocal pneumonia   Hyperkalemia   Stage 4 chronic kidney disease (HCC)   DM (diabetes mellitus) with complications (HCC)   Dyslipidemia   * Acute on chronic diastolic CHF (congestive heart failure) ([place]) -Patient with known h/o chronic diastolic CHF presenting with worsening SOB and hypoxia -He was previously admitted with very similar presentation and thought to have hypertensive crisis/CHF exacerbation -This appears to be the case once again, with possible multifocal PNA also contributing -He was started on BIPAP with improvement -VBG is actually quite reassuring -CXR consistent with pulmonary edema +/- multifocal PNA -Elevated BNP compared with baseline -With elevated BNP and abnl CXR, acute decompensated CHF seems probable as diagnosis -Will admit, as per the Emergency HF Mortality Risk Grade.  The patient has: severe pulmonary edema requiring increased O2 therapy -Recent echocardiogram with preserved EF and grade 1 diastolic dysfunction; bubble study during last  admission showed significant intrapulmonary shunt -He was started on NTG drip with marked improvement in BP -CHF order set utilized -Will start Lasix 40 mg IV BID -Continue BIPAP prn for now -> [place] O2 when able to transition -Improved kidney function from last hospitalization, will follow -Mildly elevated HS troponin is likely related to demand ischemia; doubt ACS based on symptoms -Cardiology consult requested  Hypertensive crisis -Patient presenting with chest pain and SOB concerning for hypertensive crisis -He did have evidence of end organ failure, with acute respiratory failure, placed on BIPAP -He was started on NTG drip in the ER with subsequent significant decrease in BP without apparent difficulty -Will admit to progressive care unit -Will resume home medications - hydralazine, Coreg, Norvasc -Continue ASA -Will add prn hydralazine   Multifocal pneumonia -Patient with cough, mild rhinorrhea but no other infectious symptoms -CXR read as worsening RLL infiltrate and multifocal PNA -Appears to be more c/w CHF and hypertensive crisis, but there may also be a component of PNA -He was given Cefepime and Vanc in the ER, will continue for now -Check procalcitonin level as well as Strep pneumo and Legionella   Hyperkalemia -K+ 6.0 on presentation, known CKD but improved from prior -No peaked T waves noted on EKG -He has been given Lasix -Will recheck STAT BMP now  Stage 4 chronic kidney disease ([place]) -Better than during prior hospitalization -Will trend with diuresis  Dyslipidemia -Continue Lipitor  DM (diabetes mellitus) with complications ([place]) -N3Z was 5.8 in July -Continue glargine -Cover with sensitive-scale SSI         Note: This patient has been tested and is negative for the novel coronavirus COVID-19. The patient has been fully vaccinated against COVID-19.   Level of care: Progressive DVT prophylaxis: Lovenox  Code Status:  DNR - confirmed with  patient Family Communication: None present; I spoke with his cousin this afternoon Disposition Plan:  The patient is from: home  Anticipated d/c is to: home, possibly with [facility] services  Anticipated d/c date will depend on clinical response to treatment,  likely 2-4 days  Patient is currently: acutely ill Consults called: Cardiology; TOC team; PT/OT/RT; nutrition Admission status: Admit - It is my clinical opinion that admission to INPATIENT is reasonable and necessary because this patient will require at least 2 midnights in the hospital to treat this condition based on the medical complexity of the problems presented.  Given the aforementioned information, the predictability of an adverse outcome is felt to be significant.   Karmen Bongo MD [facility]   How to contact the [facility] Attending or Consulting provider [place] or covering provider during after hours [place], for this patient?  Check the care team in [facility] and look for a) attending/consulting [facility] provider listed and b) the [facility] team listed Log into [email or web address] and use [place]'s universal password to access. If you do not have the password, please contact the hospital operator. Locate the [facility] provider you are looking for under [facility] and page to a number that you can be directly reached. If you still have difficulty reaching the provider, please page the [facility] (Director on Call) for the Hospitalists listed on amion for assistance.   04/01/2021, 11:53 AM

## 2021-04-01 NOTE — ED Triage Notes (Signed)
The pt arrived  from [facility] from home difficulty breathing since 0400am  he was no better so [facility] was called .  Low sats on arrival 69 alert skin warm and dry

## 2021-04-01 NOTE — Assessment & Plan Note (Signed)
-  Patient with known h/o chronic diastolic CHF presenting with worsening SOB and hypoxia -He was previously admitted with very similar presentation and thought to have hypertensive crisis/CHF exacerbation -This appears to be the case once again, with possible multifocal PNA also contributing -He was started on BIPAP with improvement -VBG is actually quite reassuring -CXR consistent with pulmonary edema +/- multifocal PNA -Elevated BNP compared with baseline -With elevated BNP and abnl CXR, acute decompensated CHF seems probable as diagnosis -Will admit, as per the Emergency HF Mortality Risk Grade.  The patient has: severe pulmonary edema requiring increased O2 therapy -Recent echocardiogram with preserved EF and grade 1 diastolic dysfunction; bubble study during last admission showed significant intrapulmonary shunt -He was started on NTG drip with marked improvement in BP -CHF order set utilized -Will start Lasix 40 mg IV BID -Continue BIPAP prn for now -> [place] O2 when able to transition -Improved kidney function from last hospitalization, will follow -Mildly elevated HS troponin is likely related to demand ischemia; doubt ACS based on symptoms -Cardiology consult requested

## 2021-04-01 NOTE — Progress Notes (Signed)
RT note: RT trial patient off BIPAP. Patient placed on HFNC @ 12L. Vital signs stable at this time. Beside RN aware.

## 2021-04-01 NOTE — ED Notes (Signed)
Pt being placed on 12 L [place] with RT to try to come off bi pap

## 2021-04-01 NOTE — ED Notes (Signed)
Dinner Ordered 

## 2021-04-01 NOTE — Assessment & Plan Note (Signed)
-  Better than during prior hospitalization -Will trend with diuresis

## 2021-04-01 NOTE — Assessment & Plan Note (Signed)
-  Continue Lipitor °

## 2021-04-01 NOTE — ED Notes (Signed)
[facility] on call for pts chest pain

## 2021-04-01 NOTE — ED Provider Notes (Signed)
[facility] EMERGENCY DEPARTMENT Provider Note   CSN: [id number] Arrival date & time: 04/01/21  0093     History Chief Complaint  Patient presents with   Shortness of Breath    Robert Chaney is a 70 y.o. male.  Patient with history of congestive heart failure, recent hospitalization for congestive heart failure exacerbation with pneumonia presents to the ER with complaints of severe shortness of breath.  EMS report that the patient was hypoxic, her oxygen saturations of 69%.  He was in respiratory distress.  They tried a nebulizer treatment but he seemed to worsen, was therefore placed on CPAP.  Level 5 caveat due to acuity.      Past Medical History:  Diagnosis Date   Altered mental status    a. 05/2017 - adm with blurred vision, somnolence in setting of AKI and high blood sugar.   Anemia    CKD (chronic kidney disease), stage III (HCC)    Diabetes mellitus    Diastolic CHF, chronic ([place])    A.  03/2009 Echo: EF 60-65%, Gr II diast dysfxn   Elevated troponin    a. 2013 - troponin 1.4. b. 2019 - troponin 0.32; neg nuc 07/2017.   Glaucoma    Hyperlipidemia    HYPERCHOLESTEROLEMIA   Hypertension    MARKED LEFT VENTRICULAR HYPERTROPHY BY PREVIOUS ECHOCARDIOGRAM--HE HAS HYPERDYNAMIC LEFT VENTRICULAR SYSTOLIC FUNCTION AND HAS IMPAIRED RELAXATION BY ECHO   Hypertrophic cardiomyopathy (HCC)    NSVT (nonsustained ventricular tachycardia)    Premature atrial contractions    PVC's (premature ventricular contractions)    SVT (supraventricular tachycardia) ([place])     Patient Active Problem List   Diagnosis Date Noted   Chronic constipation    Hematochezia    Chronic posterior anal fissure    Helicobacter pylori gastritis    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[place]) 03/16/2021   Abnormal CXR 03/16/2021   Elevated brain natriuretic peptide (BNP) level 03/16/2021   Left thyroid nodule 03/16/2021   GI bleed 03/16/2021   Acute blood loss anemia 03/16/2021   Acute on chronic  respiratory failure with hypoxia and hypercapnia ([place]) 02/28/2021   Melena 02/28/2021   Symptomatic anemia 02/28/2021   Syncope 01/30/2021   Acute on chronic respiratory failure with hypoxemia ([place]) 01/30/2021   Multifocal pneumonia 01/30/2021   Acute on chronic respiratory failure with hypoxia ([place]) 05/20/2020   Hypertensive urgency 02/18/2020   CHF exacerbation ([place]) 01/11/2020   Acute respiratory failure ([place]) 01/11/2020   Type 2 diabetes mellitus with retinopathy, with long-term current use of insulin ([place]) 07/16/2019   Type 2 diabetes mellitus with stage 4 chronic kidney disease, with long-term current use of insulin ([place]) 07/16/2019   Type 2 diabetes mellitus with diabetic polyneuropathy, with long-term current use of insulin ([place]) 07/16/2019   OSA (obstructive sleep apnea) 03/30/2019   Hyperglycemia 07/31/2017   Near syncope 07/31/2017   Acute-on-chronic kidney injury ([place]) 07/31/2017   AKI (acute kidney injury) ([place]) 06/09/2017   Hypertensive heart disease 11/07/2016   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[place]) 07/26/2015   Dyslipidemia 05/19/2013   Diabetic hyperosmolar non-ketotic state ([place]) 07/19/2011   DM (diabetes mellitus) with complications ([place]) 81/82/9937   Stage 4 chronic kidney disease ([place]) 07/19/2011   Elevated troponin 07/19/2011   Prolonged QT interval 07/19/2011   Hyperkalemia 07/19/2011   Volume depletion 07/19/2011   Acute on chronic diastolic CHF (congestive heart failure) ([place]) 07/19/2011   Essential hypertension 07/19/2011   Elevated CPK 07/19/2011   Hypercholesterolemia 02/20/2011   Benign hypertensive  heart disease without heart failure 02/20/2011    Past Surgical History:  Procedure Laterality Date   BIOPSY  03/03/2021   Procedure: BIOPSY;  Surgeon: Jackquline Denmark, MD;  Location: [place];  Service: Endoscopy;;   BRONCHIAL BIOPSY  03/05/2021   Procedure: BRONCHIAL BIOPSIES;  Surgeon: Candee Furbish, MD;  Location: [facility] ENDOSCOPY;   Service: Pulmonary;;   BRONCHIAL BRUSHINGS  03/05/2021   Procedure: BRONCHIAL BRUSHINGS;  Surgeon: Candee Furbish, MD;  Location: [facility] ENDOSCOPY;  Service: Pulmonary;;   BRONCHIAL WASHINGS  03/05/2021   Procedure: BRONCHIAL WASHINGS;  Surgeon: Candee Furbish, MD;  Location: [facility] ENDOSCOPY;  Service: Pulmonary;;   ESOPHAGOGASTRODUODENOSCOPY (EGD) WITH PROPOFOL N/A 03/03/2021   Procedure: ESOPHAGOGASTRODUODENOSCOPY (EGD) WITH PROPOFOL;  Surgeon: Jackquline Denmark, MD;  Location: [place];  Service: Endoscopy;  Laterality: N/A;   HEMOSTASIS CONTROL  03/05/2021   Procedure: HEMOSTASIS CONTROL;  Surgeon: Candee Furbish, MD;  Location: [facility] ENDOSCOPY;  Service: Pulmonary;;   NO PAST SURGERIES     VIDEO BRONCHOSCOPY N/A 03/05/2021   Procedure: VIDEO BRONCHOSCOPY WITH FLUORO;  Surgeon: Candee Furbish, MD;  Location: [facility] ENDOSCOPY;  Service: Pulmonary;  Laterality: N/A;       Family History  Problem Relation Age of Onset   Hypertension Father    Stroke Father    Hypertension Mother    Diabetes Brother    Hypertension Brother    Diabetes Brother     Social History   Tobacco Use   Smoking status: Never   Smokeless tobacco: Never  Vaping Use   Vaping Use: Never used  Substance Use Topics   Alcohol use: No   Drug use: No    Home Medications Prior to Admission medications   Medication Sig Start Date End Date Taking? Authorizing Provider  amLODipine (NORVASC) 10 MG tablet Take 1 tablet (10 mg total) by mouth daily. 05/13/20   Shawna Clamp, MD  aspirin EC 81 MG tablet Take 1 tablet (81 mg total) by mouth daily. Swallow whole. 03/02/20   Bhagat, Crista Luria, PA  atorvastatin (LIPITOR) 20 MG tablet Take 1 tablet (20 mg total) by mouth daily. 03/16/18   Dorothy Spark, MD  brimonidine (ALPHAGAN) 0.2 % ophthalmic solution Place 1 drop into the right eye 2 (two) times daily. 10/06/19   [provider]  carvedilol (COREG) 12.5 MG tablet Take 1 tablet (12.5 mg total) by mouth 2 (two) times  daily. 03/10/21 04/09/21  Thurnell Lose, MD  cholecalciferol (VITAMIN D3) 25 MCG (1000 UT) tablet Take 1,000 Units by mouth daily.    [provider]  Continuous Blood Gluc Sensor (DEXCOM G6 SENSOR) MISC 1 Device by Does not apply route as directed. 01/12/21   Shamleffer, Melanie Crazier, MD  Continuous Blood Gluc Transmit (DEXCOM G6 TRANSMITTER) MISC 1 Device by Does not apply route as directed. 01/12/21   Shamleffer, Melanie Crazier, MD  diclofenac Sodium (VOLTAREN) 1 % GEL Apply 4 g topically 4 (four) times daily as needed for pain. 02/07/20   [provider]  docusate sodium (COLACE) 100 MG capsule Take 2 capsules (200 mg total) by mouth 2 (two) times daily. 03/10/21   Thurnell Lose, MD  dorzolamide (TRUSOPT) 2 % ophthalmic solution Place 1 drop into both eyes 2 (two) times daily. 12/04/20   [provider]  doxazosin (CARDURA) 2 MG tablet Take 2 mg by mouth daily. Patient not sure if still taking    [provider]  furosemide (LASIX) 40 MG tablet Take 1 tablet (  40 mg total) by mouth 2 (two) times daily. 03/24/21   Mercy Riding, MD  glucose blood (ONETOUCH VERIO) test strip USE 1 STRIP TO CHECK GLUCOSE THREE TIMES DAILY AS DIRECTED 07/24/20   Shamleffer, Melanie Crazier, MD  hydrALAZINE (APRESOLINE) 100 MG tablet Take 1 tablet (100 mg total) by mouth 3 (three) times daily. 03/16/20   Skeet Latch, MD  insulin glargine (LANTUS SOLOSTAR) 100 UNIT/ML Solostar Pen Inject 10 Units into the skin daily. 03/22/21   Mercy Riding, MD  Insulin Pen Needle 32G X 4 MM MISC Use as directed with insulin 03/22/21   Mercy Riding, MD  isosorbide mononitrate (IMDUR) 60 MG 24 hr tablet Take 60 mg by mouth daily. 04/27/20   [provider]  latanoprost (XALATAN) 0.005 % ophthalmic solution Place 1 drop into the left eye at bedtime. 10/06/19   [provider]  nitroGLYCERIN (NITROSTAT) 0.4 MG SL tablet DISSOLVE ONE TABLET UNDER THE TONGUE EVERY 5 MINUTES AS  NEEDED FOR CHEST PAIN. Patient taking differently: Place 0.4 mg under the tongue every 5 (five) minutes as needed for chest pain. 03/27/20   Dorothy Spark, MD  pantoprazole (PROTONIX) 40 MG tablet Take 1 tablet (40 mg total) by mouth 2 (two) times daily before a meal. 03/10/21   Thurnell Lose, MD  polyethylene glycol (MIRALAX) 17 g packet Take 17 g by mouth daily. 03/10/21   Thurnell Lose, MD  senna-docusate (SENOKOT-S) 8.6-50 MG tablet Take 1 tablet by mouth 2 (two) times daily between meals as needed for mild constipation. 03/22/21   Mercy Riding, MD    Allergies    Aspirin  Review of Systems   Review of Systems  Unable to perform ROS: Acuity of condition   Physical Exam Updated Vital Signs BP 110/89   Pulse 71   Temp 98.1 F (36.7 C)   Resp (!) 22   Ht 5\' 8"  (1.727 m)   Wt 73.9 kg   SpO2 100%   BMI 24.77 kg/m   Physical Exam Vitals and nursing note reviewed.  Constitutional:      General: He is not in acute distress.    Appearance: Normal appearance. He is well-developed.  HENT:     Head: Normocephalic and atraumatic.     Right Ear: Hearing normal.     Left Ear: Hearing normal.     Nose: Nose normal.  Eyes:     Conjunctiva/sclera: Conjunctivae normal.     Pupils: Pupils are equal, round, and reactive to light.  Cardiovascular:     Rate and Rhythm: Regular rhythm.     Heart sounds: S1 normal and S2 normal. No murmur heard.   No friction rub. No gallop.  Pulmonary:     Effort: Pulmonary effort is normal. No respiratory distress.     Breath sounds: Decreased breath sounds, wheezing and rhonchi present.  Chest:     Chest wall: No tenderness.  Abdominal:     General: Bowel sounds are normal.     Palpations: Abdomen is soft.     Tenderness: There is no abdominal tenderness. There is no guarding or rebound. Negative signs include Murphy's sign and McBurney's sign.     Hernia: No hernia is present.  Musculoskeletal:        General: Normal range of motion.      Cervical back: Normal range of motion and neck supple.  Skin:    General: Skin is warm and dry.     Findings: No  rash.  Neurological:     Mental Status: He is alert and oriented to person, place, and time.     GCS: GCS eye subscore is 4. GCS verbal subscore is 5. GCS motor subscore is 6.     Cranial Nerves: No cranial nerve deficit.     Sensory: No sensory deficit.     Coordination: Coordination normal.  Psychiatric:        Speech: Speech normal.        Behavior: Behavior normal.        Thought Content: Thought content normal.    ED Results / Procedures / Treatments   Labs (all labs ordered are listed, but only abnormal results are displayed) Labs Reviewed  CBC WITH DIFFERENTIAL/PLATELET - Abnormal; Notable for the following components:      Result Value   RBC 2.68 (*)    Hemoglobin 7.7 (*)    HCT 26.5 (*)    MCHC 29.1 (*)    RDW 15.9 (*)    All other components within normal limits  I-STAT VENOUS BLOOD GAS, ED - Abnormal; Notable for the following components:   Potassium 6.0 (*)    HCT 24.0 (*)    Hemoglobin 8.2 (*)    All other components within normal limits  RESP PANEL BY RT-PCR (FLU A&B, COVID) ARPGX2  CULTURE, BLOOD (ROUTINE X 2)  CULTURE, BLOOD (ROUTINE X 2)  BASIC METABOLIC PANEL  BRAIN NATRIURETIC PEPTIDE  LACTIC ACID, PLASMA  TROPONIN I (HIGH SENSITIVITY)    EKG None  Radiology DG Chest Port 1 View  Result Date: 04/01/2021 CLINICAL DATA:  70 year old male with history of shortness of breath. EXAM: PORTABLE CHEST 1 VIEW COMPARISON:  Chest x-ray 03/20/2021. FINDINGS: Persistent consolidation in the inferior aspect of the right upper lobe. Patchy areas of interstitial prominence and several ill-defined opacities are also now noted elsewhere throughout the lungs bilaterally. Small right pleural effusion. No left pleural effusion. No pneumothorax. No cephalization of the pulmonary vasculature. Heart size is mildly enlarged. The patient is rotated to the right  on today's exam, resulting in distortion of the mediastinal contours and reduced diagnostic sensitivity and specificity for mediastinal pathology. Atherosclerotic calcifications in the thoracic aorta. IMPRESSION: 1. Slight worsening consolidation in the right upper lobe, compatible with pneumonia. There is also evidence to suggest developing multilobar bilateral bronchopneumonia, as above. 2. Cardiomegaly. 3. Aortic atherosclerosis. Electronically Signed   By: Vinnie Langton M.D.   On: 04/01/2021 06:25    Procedures Procedures   Medications Ordered in ED Medications  ceFEPIme (MAXIPIME) 2 g in sodium chloride 0.9 % 100 mL IVPB (has no administration in time range)  vancomycin (VANCOREADY) IVPB 1500 mg/300 mL (has no administration in time range)    ED Course  I have reviewed the triage vital signs and the nursing notes.  Pertinent labs & imaging results that were available during my care of the patient were reviewed by me and considered in my medical decision making (see chart for details).    MDM Rules/Calculators/A&P                           Patient presents to the emergency department for evaluation of shortness of breath.  Patient was recently hospitalized for acute respiratory failure secondary to congestive heart failure and pneumonia.  Patient requiring BiPAP to maintain oxygen saturations at arrival today.  X-ray shows worsening right upper lobe consolidation.  Additionally there is concern for Multi lobar bilateral bronchopneumonia.  Cannot rule out pulmonary edema.  Patient initiated on broad-spectrum antibiotics, work-up initiated.  Will sign out to oncoming ER physician to follow-up on results and likely admit patient.    Final Clinical Impression(s) / ED Diagnoses Final diagnoses:  Acute on chronic respiratory failure with hypoxia [facility])    Rx / DC Orders ED Discharge Orders     None        Jawuan Robb, Gwenyth Allegra, MD 04/01/21 [phone]

## 2021-04-02 DIAGNOSIS — I169 Hypertensive crisis, unspecified: Secondary | ICD-10-CM | POA: Diagnosis not present

## 2021-04-02 DIAGNOSIS — I5033 Acute on chronic diastolic (congestive) heart failure: Secondary | ICD-10-CM | POA: Diagnosis not present

## 2021-04-02 LAB — CBC
HCT: 24.1 % — ABNORMAL LOW (ref 39.0–52.0)
Hemoglobin: 6.4 g/dL — CL (ref 13.0–17.0)
MCH: 27.6 pg (ref 26.0–34.0)
MCHC: 26.6 g/dL — ABNORMAL LOW (ref 30.0–36.0)
MCV: 103.9 fL — ABNORMAL HIGH (ref 80.0–100.0)
Platelets: 260 10*3/uL (ref 150–400)
RBC: 2.32 MIL/uL — ABNORMAL LOW (ref 4.22–5.81)
RDW: 15.8 % — ABNORMAL HIGH (ref 11.5–15.5)
WBC: 8 10*3/uL (ref 4.0–10.5)
nRBC: 0 % (ref 0.0–0.2)

## 2021-04-02 LAB — CBG MONITORING, ED
Glucose-Capillary: 147 mg/dL — ABNORMAL HIGH (ref 70–99)
Glucose-Capillary: 190 mg/dL — ABNORMAL HIGH (ref 70–99)

## 2021-04-02 LAB — CBC WITH DIFFERENTIAL/PLATELET
Abs Immature Granulocytes: 0.03 10*3/uL (ref 0.00–0.07)
Basophils Absolute: 0.1 10*3/uL (ref 0.0–0.1)
Basophils Relative: 1 %
Eosinophils Absolute: 0.1 10*3/uL (ref 0.0–0.5)
Eosinophils Relative: 1 %
HCT: 26.2 % — ABNORMAL LOW (ref 39.0–52.0)
Hemoglobin: 7.7 g/dL — ABNORMAL LOW (ref 13.0–17.0)
Immature Granulocytes: 1 %
Lymphocytes Relative: 12 %
Lymphs Abs: 0.8 10*3/uL (ref 0.7–4.0)
MCH: 28.4 pg (ref 26.0–34.0)
MCHC: 29.4 g/dL — ABNORMAL LOW (ref 30.0–36.0)
MCV: 96.7 fL (ref 80.0–100.0)
Monocytes Absolute: 0.5 10*3/uL (ref 0.1–1.0)
Monocytes Relative: 7 %
Neutro Abs: 4.8 10*3/uL (ref 1.7–7.7)
Neutrophils Relative %: 78 %
Platelets: 261 10*3/uL (ref 150–400)
RBC: 2.71 MIL/uL — ABNORMAL LOW (ref 4.22–5.81)
RDW: 16.4 % — ABNORMAL HIGH (ref 11.5–15.5)
WBC: 6.1 10*3/uL (ref 4.0–10.5)
nRBC: 0 % (ref 0.0–0.2)

## 2021-04-02 LAB — I-STAT ARTERIAL BLOOD GAS, ED
Acid-Base Excess: 0 mmol/L (ref 0.0–2.0)
Bicarbonate: 26.8 mmol/L (ref 20.0–28.0)
Calcium, Ion: 1.33 mmol/L (ref 1.15–1.40)
HCT: 23 % — ABNORMAL LOW (ref 39.0–52.0)
Hemoglobin: 7.8 g/dL — ABNORMAL LOW (ref 13.0–17.0)
O2 Saturation: 92 %
Patient temperature: 98.6
Potassium: 4.9 mmol/L (ref 3.5–5.1)
Sodium: 146 mmol/L — ABNORMAL HIGH (ref 135–145)
TCO2: 29 mmol/L (ref 22–32)
pCO2 arterial: 57.3 mmHg — ABNORMAL HIGH (ref 32.0–48.0)
pH, Arterial: 7.277 — ABNORMAL LOW (ref 7.350–7.450)
pO2, Arterial: 73 mmHg — ABNORMAL LOW (ref 83.0–108.0)

## 2021-04-02 LAB — BASIC METABOLIC PANEL
Anion gap: 7 (ref 5–15)
BUN: 56 mg/dL — ABNORMAL HIGH (ref 8–23)
CO2: 22 mmol/L (ref 22–32)
Calcium: 8.5 mg/dL — ABNORMAL LOW (ref 8.9–10.3)
Chloride: 113 mmol/L — ABNORMAL HIGH (ref 98–111)
Creatinine, Ser: 2.38 mg/dL — ABNORMAL HIGH (ref 0.61–1.24)
GFR, Estimated: 28 mL/min — ABNORMAL LOW (ref >60)
Glucose, Bld: 187 mg/dL — ABNORMAL HIGH (ref 70–99)
Potassium: 5.1 mmol/L (ref 3.5–5.1)
Sodium: 142 mmol/L (ref 135–145)

## 2021-04-02 LAB — GLUCOSE, CAPILLARY
Glucose-Capillary: 67 mg/dL — ABNORMAL LOW (ref 70–99)
Glucose-Capillary: 83 mg/dL (ref 70–99)
Glucose-Capillary: 118 mg/dL — ABNORMAL HIGH (ref 70–99)

## 2021-04-02 LAB — TROPONIN I (HIGH SENSITIVITY)
Troponin I (High Sensitivity): 113 ng/L (ref <18)
Troponin I (High Sensitivity): 105 ng/L (ref <18)
Troponin I (High Sensitivity): 108 ng/L (ref <18)

## 2021-04-02 LAB — POTASSIUM: Potassium: 5.1 mmol/L (ref 3.5–5.1)

## 2021-04-02 LAB — PREPARE RBC (CROSSMATCH)

## 2021-04-02 LAB — POC OCCULT BLOOD, ED: Fecal Occult Bld: POSITIVE — AB

## 2021-04-02 MED ORDER — HEPARIN BOLUS VIA INFUSION
2000.0000 [IU] | Freq: Once | INTRAVENOUS | Status: AC
  Administered 2021-04-02: 2000 [IU] via INTRAVENOUS
  Filled 2021-04-02: qty 2000

## 2021-04-02 MED ORDER — ISOSORBIDE MONONITRATE ER 60 MG PO TB24
60.0000 mg | ORAL_TABLET | Freq: Every day | ORAL | Status: DC
  Administered 2021-04-02 – 2021-04-14 (×13): 60 mg via ORAL
  Filled 2021-04-02 (×13): qty 1

## 2021-04-02 MED ORDER — MORPHINE SULFATE (PF) 2 MG/ML IV SOLN
2.0000 mg | Freq: Once | INTRAVENOUS | Status: AC
  Administered 2021-04-02: 2 mg via INTRAVENOUS
  Filled 2021-04-02: qty 1

## 2021-04-02 MED ORDER — SODIUM CHLORIDE 0.9% IV SOLUTION: Freq: Once | INTRAVENOUS | Status: AC

## 2021-04-02 MED ORDER — ACETAMINOPHEN 325 MG PO TABS
650.0000 mg | ORAL_TABLET | Freq: Once | ORAL | Status: AC
  Administered 2021-04-02: 650 mg via ORAL
  Filled 2021-04-02: qty 2

## 2021-04-02 MED ORDER — HEPARIN (PORCINE) 25000 UT/250ML-% IV SOLN
900.0000 [IU]/h | INTRAVENOUS | Status: DC
  Administered 2021-04-02: 900 [IU]/h via INTRAVENOUS
  Filled 2021-04-02: qty 250

## 2021-04-02 MED ORDER — PANTOPRAZOLE SODIUM 40 MG PO TBEC
40.0000 mg | DELAYED_RELEASE_TABLET | Freq: Two times a day (BID) | ORAL | Status: DC
  Administered 2021-04-02 – 2021-04-14 (×24): 40 mg via ORAL
  Filled 2021-04-02 (×24): qty 1

## 2021-04-02 MED ORDER — DIPHENHYDRAMINE HCL 25 MG PO CAPS
25.0000 mg | ORAL_CAPSULE | Freq: Once | ORAL | Status: AC
  Administered 2021-04-02: 25 mg via ORAL
  Filled 2021-04-02: qty 1

## 2021-04-02 NOTE — ED Notes (Signed)
Admitting MD made aware of the positive Hemoccult

## 2021-04-02 NOTE — Progress Notes (Signed)
ANTICOAGULATION CONSULT NOTE - Initial Consult  Pharmacy Consult for Heparin Indication: chest pain/ACS  No Active Allergies  Patient Measurements: Height: 5\' 8"  (172.7 cm) Weight: 73.9 kg (162 lb 14.7 oz) IBW/kg (Calculated) : 68.4  Vital Signs: BP: 97/70 (10/10 0045) Pulse Rate: 73 (10/10 0045)  Labs: Recent Labs    04/01/21 0615 04/01/21 0630 04/01/21 0824 04/01/21 1206 04/01/21 1756 04/02/21 0036  HGB 7.7* 8.2*  --   --   --  7.8*  HCT 26.5* 24.0*  --   --   --  23.0*  PLT 317  --   --   --   --   --   CREATININE 2.83*  --   --  2.55*  --   --   TROPONINIHS 104*  --  99*  --  108*  --     Estimated Creatinine Clearance: 25.3 mL/min (A) (by C-G formula based on SCr of 2.55 mg/dL (H)).   Medical History: Past Medical History:  Diagnosis Date   Altered mental status    a. 05/2017 - adm with blurred vision, somnolence in setting of AKI and high blood sugar.   Anemia    CKD (chronic kidney disease), stage III (HCC)    Diabetes mellitus    Diastolic CHF, chronic ([place])    A.  03/2009 Echo: EF 60-65%, Gr II diast dysfxn   Elevated troponin    a. 2013 - troponin 1.4. b. 2019 - troponin 0.32; neg nuc 07/2017.   Glaucoma    Hyperlipidemia    HYPERCHOLESTEROLEMIA   Hypertension    MARKED LEFT VENTRICULAR HYPERTROPHY BY PREVIOUS ECHOCARDIOGRAM--HE HAS HYPERDYNAMIC LEFT VENTRICULAR SYSTOLIC FUNCTION AND HAS IMPAIRED RELAXATION BY ECHO   Hypertrophic cardiomyopathy (HCC)    NSVT (nonsustained ventricular tachycardia)    Premature atrial contractions    PVC's (premature ventricular contractions)    SVT (supraventricular tachycardia) (HCC)     Medications:  No current facility-administered medications on file prior to encounter.   Current Outpatient Medications on File Prior to Encounter  Medication Sig Dispense Refill   amLODipine (NORVASC) 10 MG tablet Take 1 tablet (10 mg total) by mouth daily. 30 tablet 1   aspirin EC 81 MG tablet Take 1 tablet (81 mg total) by  mouth daily. Swallow whole. 90 tablet 3   atorvastatin (LIPITOR) 20 MG tablet Take 1 tablet (20 mg total) by mouth daily. 90 tablet 2   brimonidine (ALPHAGAN) 0.2 % ophthalmic solution Place 1 drop into the right eye 2 (two) times daily.     carvedilol (COREG) 12.5 MG tablet Take 1 tablet (12.5 mg total) by mouth 2 (two) times daily. 60 tablet 0   cholecalciferol (VITAMIN D3) 25 MCG (1000 UT) tablet Take 1,000 Units by mouth daily.     docusate sodium (COLACE) 100 MG capsule Take 2 capsules (200 mg total) by mouth 2 (two) times daily. 60 capsule 0   dorzolamide (TRUSOPT) 2 % ophthalmic solution Place 1 drop into both eyes 2 (two) times daily.     furosemide (LASIX) 40 MG tablet Take 1 tablet (40 mg total) by mouth 2 (two) times daily. 60 tablet 1   hydrALAZINE (APRESOLINE) 100 MG tablet Take 1 tablet (100 mg total) by mouth 3 (three) times daily. 270 tablet 3   insulin glargine (LANTUS SOLOSTAR) 100 UNIT/ML Solostar Pen Inject 10 Units into the skin daily. 15 mL 11   isosorbide mononitrate (IMDUR) 60 MG 24 hr tablet Take 60 mg by mouth daily.  latanoprost (XALATAN) 0.005 % ophthalmic solution Place 1 drop into the left eye at bedtime.     nitroGLYCERIN (NITROSTAT) 0.4 MG SL tablet DISSOLVE ONE TABLET UNDER THE TONGUE EVERY 5 MINUTES AS NEEDED FOR CHEST PAIN. (Patient taking differently: Place 0.4 mg under the tongue every 5 (five) minutes as needed for chest pain.) 25 tablet 7   Continuous Blood Gluc Sensor (DEXCOM G6 SENSOR) MISC 1 Device by Does not apply route as directed. 9 each 3   Continuous Blood Gluc Transmit (DEXCOM G6 TRANSMITTER) MISC 1 Device by Does not apply route as directed. 1 each 3   glucose blood (ONETOUCH VERIO) test strip USE 1 STRIP TO CHECK GLUCOSE THREE TIMES DAILY AS DIRECTED 100 each 0   Insulin Pen Needle 32G X 4 MM MISC Use as directed with insulin 100 each 0   pantoprazole (PROTONIX) 40 MG tablet Take 1 tablet (40 mg total) by mouth 2 (two) times daily before a meal.  (Patient not taking: Reported on 04/01/2021) 60 tablet 2   polyethylene glycol (MIRALAX) 17 g packet Take 17 g by mouth daily. (Patient not taking: Reported on 04/01/2021) 28 each 0   senna-docusate (SENOKOT-S) 8.6-50 MG tablet Take 1 tablet by mouth 2 (two) times daily between meals as needed for mild constipation. (Patient not taking: Reported on 04/01/2021) 180 tablet 0     Assessment: 70 y.o. male with chest pain, possible ACS, for heparin.  Lovenox 30 mg SQ given at 10 pm Goal of Therapy:  Heparin level 0.3-0.7 units/ml Monitor platelets by anticoagulation protocol: Yes   Plan:  Heparin 2000 units IV bolus, then start heparin 900 units/hr Check heparin level in 8 hours.     Reesha Debes, Bronson Curb 04/02/2021,1:12 AM

## 2021-04-02 NOTE — Progress Notes (Signed)
Dr. Tonie Griffith had paged me regarding worsening CP in Robert Chaney.  Admitted for hypertensive urgency and with known HOCM.  During my evaluation his chest pain had resolved however on nitroglycerin 55 mcg/min and mildly hypotensive.  Patient alert and oriented but very tired.  I do not favor this to be primary ACS but likely supply demand mismatch in the setting of up titration of nitroglycerin.  His troponins have been flat and he likely has microvascular ischemia in the setting of severe LVH. VS: HR 71, BP 94/71 (78), 95% on 8 L [place].  Recommended down titration of nitroglycerin to allow for moderately higher maps given that patient is currently chest pain-free.  I do not think that he needs to be anticoagulated at this point.  His ECG was similar to prior.

## 2021-04-02 NOTE — Progress Notes (Addendum)
PROGRESS NOTE    Robert Chaney  BMW:[id number] DOB: February 20, 1951 DOA: 04/01/2021 PCP: Robert Ebbs, MD   Chief Complaint  Patient presents with   Shortness of Breath    Brief Narrative:   70 year old gentleman prior history of stage IV CKD, diabetes, chronic diastolic heart failure, chronic hypoxic respiratory failure on 4 L of nasal cannula oxygen at home, hypertension and hyperlipidemia, recently diagnosed chronic gastritis from an EGD on 03/03/2021 presents to ED with sudden onset of chest pain and shortness of breath requiring BiPAP and currently is on 8 L of nasal cannula oxygen.  Patient was started on nitroglycerin drip and was weaned off today. Cardiology consulted, suggested better blood pressure control and wean nitroglycerin. Assessment & Plan:   Principal Problem:   Acute on chronic diastolic CHF (congestive heart failure) (HCC) Active Problems:   DM (diabetes mellitus) with complications (HCC)   Stage 4 chronic kidney disease (HCC)   Hyperkalemia   Dyslipidemia   Hypertensive crisis   Multifocal pneumonia   Acute on chronic respiratory failure with hypoxia probably secondary to a combination of acute on chronic diastolic heart failure and hypertensive urgency and healthcare associated pneumonia Patient's chest pain has resolved. His shortness of breath has improved. He initially required BiPAP with improvement in his breathing and currently is on 8 L of nasal cannula oxygen to keep sats greater than 90%. Chest x-ray shows Slight worsening consolidation in the right upper lobe, compatible with pneumonia. There is also evidence to suggest developing multilobar bilateral bronchopneumonia. He was started on IV vancomycin and cefepime.  MRSA PCR is negative IV vancomycin discontinued. Will order sputum cultures.  Urine for strep pneumonia urinary antigen is negative. Respiratory panel by RT-PCR is negative for flu and COVID-19 Patient remains afebrile and WBC count within  normal limits. Procalcitonin level 0.26.    Mild acute on chronic diastolic heart failure Patient is currently on IV Lasix 80 mg twice daily Continue with strict intake and output and daily weight fluid restriction and monitor renal parameters while on IV Lasix Creatinine stable at 2.3     Anemia of chronic disease/?  Anemia of blood loss Baseline hemoglobin around 8, hemoglobin dropped from 8.2-6.4 this morning.  S/p 1 unit of PRBC transfusion and hemoglobin has improved to 7.7. Stool for occult blood is positive. Patient has been evaluated by GI with an EGD on last admission and was found to have chronic gastritis.  Patient will need colonoscopy for further evaluation we will request GI to see him in the morning versus outpatient follow-up. Patient is currently on aspirin 81 mg daily, which we will hold for guaiac positive stools. Iron Levels low at 19 will probably need iron replacement prior to discharge.  Stage IV CKD Creatinine at 2.3 today appears to be at baseline.   Hyperkalemia Resolved. Potassium at 5.1 improved from 16.  Elevated troponins felt to be from demand ischemia in the setting of hypertensive crisis and worsening anemia. IV heparin is discontinued.    Hypertensive urgency Patient was initially started on nitroglycerin gtt and has been weaned off. Blood pressure parameters have improved Patient currently on hydralazine 100 mg 3 times daily, IV Lasix 80 mg twice daily, Coreg 25 mg twice daily, amlodipine 10 mg daily.   Hyperlipidemia Continue with Lipitor 20 mg daily.    Chronic constipation Patient is on Fleet Enema, Colace and senna now.   Diabetes mellitus with hyperglycemia Non insulin dependent.  CBG (last 3)  Recent Labs  04/01/21 2225 04/02/21 0810 04/02/21 1328  GLUCAP 98 147* 190*   Pt was started on Semglee in-house 10 units daily and sliding scale insulin. Get hemoglobin A1c in the morning.  Body mass index is 42.38  kg/m. Morbid obesity.  Recommend outpatient follow up with PCP.        DVT prophylaxis: SCDs Code Status: DNR Family Communication: None at bedside disposition:   Status is: Inpatient  Remains inpatient appropriate because:Ongoing diagnostic testing needed not appropriate for outpatient work up, Unsafe d/c plan, and IV treatments appropriate due to intensity of illness or inability to take PO  Dispo: The patient is from: Home              Anticipated d/c is to: Home              Patient currently is not medically stable to d/c.   Difficult to place patient No       Consultants:  Cardiology  Procedures: None Antimicrobials: Cefepime  Subjective: Patient reports not feeling too great but much better than yesterday Patient denies any chest pain, nausea vomiting or abdominal pain at this time  Objective: Vitals:   04/02/21 1300 04/02/21 1330 04/02/21 1420 04/02/21 1430  BP: (!) 171/79 (!) 184/75 139/64 (!) 136/57  Pulse: 80 78 (!) 58 (!) 57  Resp: (!) 26 18 (!) 24   Temp:      TempSrc:      SpO2: (!) 62% 100% 100% 100%  Weight:      Height:        Intake/Output Summary (Last 24 hours) at 04/02/2021 1542 Last data filed at 04/02/2021 0810 Gross per 24 hour  Intake 1008.98 ml  Output 1200 ml  Net -191.02 ml   Filed Weights   04/01/21 0626  Weight: 73.9 kg    Examination:  General exam: Ill-appearing elderly gentleman on 8 L of nasal cannula oxygen not in any kind of distress Respiratory system: Diminished air entry throughout the lungs, tachypnea on talking no wheezing heard Cardiovascular system: S1 & S2 heard, RRR.  JVD cannot be appreciated, pedal edema Gastrointestinal system: Abdomen is nondistended, soft and nontender. . Normal bowel sounds heard. Central nervous system: Alert and oriented. No focal neurological deficits. Extremities: Symmetric 5 x 5 power. Skin: No rashes, lesions or ulcers Psychiatry:  Mood & affect appropriate.     Data  Reviewed: I have personally reviewed following labs and imaging studies  CBC: Recent Labs  Lab 04/01/21 0615 04/01/21 0630 04/02/21 0036 04/02/21 0229 04/02/21 1428  WBC 9.3  --   --  8.0 6.1  NEUTROABS 7.3  --   --   --  4.8  HGB 7.7* 8.2* 7.8* 6.4* 7.7*  HCT 26.5* 24.0* 23.0* 24.1* 26.2*  MCV 98.9  --   --  103.9* 96.7  PLT 317  --   --  260 696    Basic Metabolic Panel: Recent Labs  Lab 04/01/21 0615 04/01/21 0630 04/01/21 1206 04/02/21 0008 04/02/21 0036 04/02/21 0229  NA 142 145 144  --  146* 142  K 6.0* 6.0* 5.7* 5.1 4.9 5.1  CL 110  --  114*  --   --  113*  CO2 26  --  25  --   --  22  GLUCOSE 252*  --  164*  --   --  187*  BUN 64*  --  60*  --   --  56*  CREATININE 2.83*  --  2.55*  --   --  2.38*  CALCIUM 9.1  --  9.0  --   --  8.5*    GFR: Estimated Creatinine Clearance: 27.1 mL/min (A) (by C-G formula based on SCr of 2.38 mg/dL (H)).  Liver Function Tests: No results for input(s): AST, ALT, ALKPHOS, BILITOT, PROT, ALBUMIN in the last 168 hours.  CBG: Recent Labs  Lab 04/01/21 1152 04/01/21 1744 04/01/21 2225 04/02/21 0810 04/02/21 1328  GLUCAP 156* 118* 98 147* 190*     Recent Results (from the past 240 hour(s))  Resp Panel by RT-PCR (Flu A&B, Covid) Nasopharyngeal Swab     Status: None   Collection Time: 04/01/21  6:13 AM   Specimen: Nasopharyngeal Swab; Nasopharyngeal(NP) swabs in vial transport medium  Result Value Ref Range Status   SARS Coronavirus 2 by RT PCR NEGATIVE NEGATIVE Final    Comment: (NOTE) SARS-CoV-2 target nucleic acids are NOT DETECTED.  The SARS-CoV-2 RNA is generally detectable in upper respiratory specimens during the acute phase of infection. The lowest concentration of SARS-CoV-2 viral copies this assay can detect is 138 copies/mL. A negative result does not preclude SARS-Cov-2 infection and should not be used as the sole basis for treatment or other patient management decisions. A negative result may occur with   improper specimen collection/handling, submission of specimen other than nasopharyngeal swab, presence of viral mutation(s) within the areas targeted by this assay, and inadequate number of viral copies(<138 copies/mL). A negative result must be combined with clinical observations, patient history, and epidemiological information. The expected result is Negative.  Fact Sheet for Patients:  EntrepreneurPulse.com.au  Fact Sheet for Healthcare Providers:  IncredibleEmployment.be  This test is no t yet approved or cleared by the [place] FDA and  has been authorized for detection and/or diagnosis of SARS-CoV-2 by FDA under an Emergency Use Authorization (EUA). This EUA will remain  in effect (meaning this test can be used) for the duration of the COVID-19 declaration under Section 564(b)(1) of the Act, 21 U.S.C.section 360bbb-3(b)(1), unless the authorization is terminated  or revoked sooner.       Influenza A by PCR NEGATIVE NEGATIVE Final   Influenza B by PCR NEGATIVE NEGATIVE Final    Comment: (NOTE) The Xpert Xpress SARS-CoV-2/FLU/RSV plus assay is intended as an aid in the diagnosis of influenza from Nasopharyngeal swab specimens and should not be used as a sole basis for treatment. Nasal washings and aspirates are unacceptable for Xpert Xpress SARS-CoV-2/FLU/RSV testing.  Fact Sheet for Patients: EntrepreneurPulse.com.au  Fact Sheet for Healthcare Providers: IncredibleEmployment.be  This test is not yet approved or cleared by the [place] FDA and has been authorized for detection and/or diagnosis of SARS-CoV-2 by FDA under an Emergency Use Authorization (EUA). This EUA will remain in effect (meaning this test can be used) for the duration of the COVID-19 declaration under Section 564(b)(1) of the Act, 21 U.S.C. section 360bbb-3(b)(1), unless the authorization is terminated  or revoked.  Performed at [facility], [place] [address] [place], [place] [address]   Culture, blood (Routine X 2) w Reflex to ID Panel     Status: None (Preliminary result)   Collection Time: 04/01/21 12:05 PM   Specimen: BLOOD RIGHT HAND  Result Value Ref Range Status   Specimen Description BLOOD RIGHT HAND  Final   Special Requests   Final    BOTTLES DRAWN AEROBIC AND ANAEROBIC Blood Culture results may not be optimal due to an inadequate volume of blood received in culture bottles   Culture  Final    NO GROWTH < 24 HOURS Performed at [facility], [place] [address], [place], [place] [address]    Report Status PENDING  Incomplete  Culture, blood (Routine X 2) w Reflex to ID Panel     Status: None (Preliminary result)   Collection Time: 04/01/21 12:06 PM   Specimen: BLOOD RIGHT HAND  Result Value Ref Range Status   Specimen Description BLOOD RIGHT HAND  Final   Special Requests   Final    BOTTLES DRAWN AEROBIC ONLY Blood Culture results may not be optimal due to an inadequate volume of blood received in culture bottles   Culture   Final    NO GROWTH < 24 HOURS Performed at [facility], [place] [address] [place], [place] [address]    Report Status PENDING  Incomplete  MRSA Next Gen by PCR, Nasal     Status: None   Collection Time: 04/01/21 12:49 PM   Specimen: Nasal Mucosa; Nasal Swab  Result Value Ref Range Status   MRSA by PCR Next Gen NOT DETECTED NOT DETECTED Final    Comment: (NOTE) The GeneXpert MRSA Assay (FDA approved for NASAL specimens only), is one component of a comprehensive MRSA colonization surveillance program. It is not intended to diagnose MRSA infection nor to guide or monitor treatment for MRSA infections. Test performance is not FDA approved in patients less than 72 years old. Performed at [facility], [place] [address], [place], [place] [address]          Radiology Studies: DG Chest [place] 1 View  Result Date:  04/01/2021 CLINICAL DATA:  70 year old male with history of shortness of breath. EXAM: PORTABLE CHEST 1 VIEW COMPARISON:  Chest x-ray 03/20/2021. FINDINGS: Persistent consolidation in the inferior aspect of the right upper lobe. Patchy areas of interstitial prominence and several ill-defined opacities are also now noted elsewhere throughout the lungs bilaterally. Small right pleural effusion. No left pleural effusion. No pneumothorax. No cephalization of the pulmonary vasculature. Heart size is mildly enlarged. The patient is rotated to the right on today's exam, resulting in distortion of the mediastinal contours and reduced diagnostic sensitivity and specificity for mediastinal pathology. Atherosclerotic calcifications in the thoracic aorta. IMPRESSION: 1. Slight worsening consolidation in the right upper lobe, compatible with pneumonia. There is also evidence to suggest developing multilobar bilateral bronchopneumonia, as above. 2. Cardiomegaly. 3. Aortic atherosclerosis. Electronically Signed   By: Vinnie Langton M.D.   On: 04/01/2021 06:25        Scheduled Meds:  amLODipine  10 mg Oral Daily   aspirin EC  81 mg Oral Daily   atorvastatin  20 mg Oral Daily   brimonidine  1 drop Right Eye BID   carvedilol  25 mg Oral BID   docusate sodium  200 mg Oral BID   dorzolamide  1 drop Both Eyes BID   furosemide  80 mg Intravenous BID   hydrALAZINE  100 mg Oral TID   insulin aspart  0-9 Units Subcutaneous TID WC   insulin glargine-yfgn  10 Units Subcutaneous Daily   latanoprost  1 drop Left Eye QHS    morphine injection  4 mg Intravenous Once   sodium chloride flush  3 mL Intravenous Q12H   Continuous Infusions:  ceFEPime (MAXIPIME) IV Stopped (04/02/21 0849)   nitroGLYCERIN Stopped (04/02/21 0953)     LOS: 1 day        Hosie Poisson, MD [facility]   To contact the attending  provider between 7A-7P or the covering provider during after hours 7P-7A, please log into the web site  [email or web address] and access using universal [place] password for that web site. If you do not have the password, please call the hospital operator.  04/02/2021, 3:42 PM

## 2021-04-02 NOTE — Progress Notes (Addendum)
Notified by RN that pt complaining of 10/10 at this time. Is on NTG infusion since this afternoon.  Has mild SOB. Has had to have increase in oxygen by N/C from 8 L/min to 10 L/min.  Pain does not radiate.  EKG done and no significant change from previous EKG earlier in day.  Denies nausea.  Pt examined at bedside.   VS- HR-75-80  RR-20-22 BP-94-120/70-85  O2 sat-91-95% on n/c CV-RRR, no murmur, rub or gallop. +1 edema legs. Resp-Equal breath sounds with bilateral rales. No wheezing Abdomen-Soft, NTND normal bowel sounds.   Reviewed EKG which shows NSR with nonspecific ST changes that are unchanged from previous EKGs, LVH. Qtc-459.   Started on heparin infusion for ACS, pharmacy consulted to dose.  Check troponin Q 2 hr x 2.  Given Morphine 2 mg IV now.  Check ABG Discussed with Cardiology, Dr. Renella Cunas who will see patient and provide further recommendation. May need to wean the NTG due to decreased afterload which can cause chest pain in HOCM patient.

## 2021-04-02 NOTE — Progress Notes (Addendum)
Progress Note  Patient Name: Robert Chaney Date of Encounter: 04/02/2021  [facility] Cardiologist: Skeet Latch, MD   Subjective   Pt resting comfortably. Denies chest pain. On nasal cannula this am.   Inpatient Medications    Scheduled Meds:  amLODipine  10 mg Oral Daily   aspirin EC  81 mg Oral Daily   atorvastatin  20 mg Oral Daily   brimonidine  1 drop Right Eye BID   carvedilol  25 mg Oral BID   docusate sodium  200 mg Oral BID   dorzolamide  1 drop Both Eyes BID   furosemide  80 mg Intravenous BID   hydrALAZINE  100 mg Oral TID   insulin aspart  0-9 Units Subcutaneous TID WC   insulin glargine-yfgn  10 Units Subcutaneous Daily   latanoprost  1 drop Left Eye QHS    morphine injection  4 mg Intravenous Once   sodium chloride flush  3 mL Intravenous Q12H   Continuous Infusions:  ceFEPime (MAXIPIME) IV     heparin 900 Units/hr (04/02/21 0137)   nitroGLYCERIN 130 mcg/min (04/02/21 7026)   [START ON 04/04/2021] vancomycin     PRN Meds: acetaminophen **OR** acetaminophen, bisacodyl, hydrALAZINE, oxyCODONE, polyethylene glycol, sodium phosphate   Vital Signs    Vitals:   04/02/21 0610 04/02/21 0620 04/02/21 0630 04/02/21 0750  BP: (!) 157/86 (!) 178/70 (!) 182/88 (!) 169/117  Pulse: 68 73 70 68  Resp: 18 18 18    Temp:      TempSrc:      SpO2: 91% 96% 97% 98%  Weight:      Height:        Intake/Output Summary (Last 24 hours) at 04/02/2021 0804 Last data filed at 04/02/2021 0519 Gross per 24 hour  Intake 702.5 ml  Output 1200 ml  Net -497.5 ml   Last 3 Weights 04/01/2021 03/24/2021 03/22/2021  Weight (lbs) 162 lb 14.7 oz 163 lb 160 lb 11.2 oz  Weight (kg) 73.9 kg 73.936 kg 72.893 kg      Telemetry    Sinus - Personally Reviewed  ECG    Sinus, LVH, chronic inferior T wave inversion - Personally Reviewed  Physical Exam   GEN: No acute distress.   Neck: No JVD Cardiac: RRR, soft systolic murmur with distant heart sounds.   Respiratory:  Clear to auscultation bilaterally. GI: Soft, nontender, non-distended  MS: No edema; No deformity. Neuro:  Nonfocal  Psych: Normal affect   Labs    High Sensitivity Troponin:   Recent Labs  Lab 04/01/21 0615 04/01/21 0824 04/01/21 1756 04/02/21 0008 04/02/21 0229  TROPONINIHS 104* 99* 108* 113* 105*     Chemistry Recent Labs  Lab 04/01/21 0615 04/01/21 0630 04/01/21 1206 04/02/21 0008 04/02/21 0036 04/02/21 0229  NA 142   < > 144  --  146* 142  K 6.0*   < > 5.7* 5.1 4.9 5.1  CL 110  --  114*  --   --  113*  CO2 26  --  25  --   --  22  GLUCOSE 252*  --  164*  --   --  187*  BUN 64*  --  60*  --   --  56*  CREATININE 2.83*  --  2.55*  --   --  2.38*  CALCIUM 9.1  --  9.0  --   --  8.5*  GFRNONAA 23*  --  26*  --   --  28*  ANIONGAP  6  --  5  --   --  7   < > = values in this interval not displayed.    Lipids No results for input(s): CHOL, TRIG, HDL, LABVLDL, LDLCALC, CHOLHDL in the last 168 hours.  Hematology Recent Labs  Lab 04/01/21 0615 04/01/21 0630 04/02/21 0036 04/02/21 0229  WBC 9.3  --   --  8.0  RBC 2.68*  --   --  2.32*  HGB 7.7* 8.2* 7.8* 6.4*  HCT 26.5* 24.0* 23.0* 24.1*  MCV 98.9  --   --  103.9*  MCH 28.7  --   --  27.6  MCHC 29.1*  --   --  26.6*  RDW 15.9*  --   --  15.8*  PLT 317  --   --  260   Thyroid No results for input(s): TSH, FREET4 in the last 168 hours.  BNP Recent Labs  Lab 04/01/21 0615  BNP 1,586.6*    DDimer No results for input(s): DDIMER in the last 168 hours.   Radiology    DG Chest Port 1 View  Result Date: 04/01/2021 CLINICAL DATA:  70 year old male with history of shortness of breath. EXAM: PORTABLE CHEST 1 VIEW COMPARISON:  Chest x-ray 03/20/2021. FINDINGS: Persistent consolidation in the inferior aspect of the right upper lobe. Patchy areas of interstitial prominence and several ill-defined opacities are also now noted elsewhere throughout the lungs bilaterally. Small right pleural effusion. No left pleural  effusion. No pneumothorax. No cephalization of the pulmonary vasculature. Heart size is mildly enlarged. The patient is rotated to the right on today's exam, resulting in distortion of the mediastinal contours and reduced diagnostic sensitivity and specificity for mediastinal pathology. Atherosclerotic calcifications in the thoracic aorta. IMPRESSION: 1. Slight worsening consolidation in the right upper lobe, compatible with pneumonia. There is also evidence to suggest developing multilobar bilateral bronchopneumonia, as above. 2. Cardiomegaly. 3. Aortic atherosclerosis. Electronically Signed   By: Vinnie Langton M.D.   On: 04/01/2021 06:25    Cardiac Studies     Patient Profile     70 y.o. male with chronic diastolic CHF, HOCM, chronic hypoxic resp failure on 4 L O2 at home, sleep apnea, stage 4 CKD, DM, HTN, HLD admitted with CHF, hypertensive urgency.   Assessment & Plan    Acute on chronic diastolic CHF: I do not think he is profoundly volume overloaded. Likely some contribution to his acute change in respiratory status in setting of hypertensive urgency. Net 500 L out with IV Lasix. Creatinine at baseline. I do not think he needs IV Lasix today.  HOCM: Coreg titrated to 25 mg po BID yesterday. No new recommendations today.  Elevated troponin: Felt to be demand ischemia in setting of hypertensive crisis with known severe LVH and worsening anemia. IV heparin is not indicated.  Persistent lung infiltrate in setting of chronic respiratory failure Hypertensive urgency: BP better controlled. Would wean NTG down today.  Anemia: Heme positive stools. Known anal fissure with bleeding. Chronic constipation. Workup per primary team. GI did see him last admission.  Stage 4 CKD: Creatinine at baseline. Pt is well known to Nephrology.     For questions or updates, please contact [place] Please consult [email or web address] for contact info under        Signed, Lauree Chandler, MD   04/02/2021, 8:04 AM

## 2021-04-02 NOTE — ED Notes (Signed)
Breakfast order placed ?

## 2021-04-03 DIAGNOSIS — I5033 Acute on chronic diastolic (congestive) heart failure: Secondary | ICD-10-CM | POA: Diagnosis not present

## 2021-04-03 DIAGNOSIS — E440 Moderate protein-calorie malnutrition: Secondary | ICD-10-CM | POA: Insufficient documentation

## 2021-04-03 LAB — BASIC METABOLIC PANEL
Anion gap: 5 (ref 5–15)
BUN: 66 mg/dL — ABNORMAL HIGH (ref 8–23)
CO2: 27 mmol/L (ref 22–32)
Calcium: 8.6 mg/dL — ABNORMAL LOW (ref 8.9–10.3)
Chloride: 110 mmol/L (ref 98–111)
Creatinine, Ser: 2.88 mg/dL — ABNORMAL HIGH (ref 0.61–1.24)
GFR, Estimated: 22 mL/min — ABNORMAL LOW (ref >60)
Glucose, Bld: 98 mg/dL (ref 70–99)
Potassium: 5.4 mmol/L — ABNORMAL HIGH (ref 3.5–5.1)
Sodium: 142 mmol/L (ref 135–145)

## 2021-04-03 LAB — GLUCOSE, CAPILLARY
Glucose-Capillary: 89 mg/dL (ref 70–99)
Glucose-Capillary: 163 mg/dL — ABNORMAL HIGH (ref 70–99)
Glucose-Capillary: 133 mg/dL — ABNORMAL HIGH (ref 70–99)
Glucose-Capillary: 191 mg/dL — ABNORMAL HIGH (ref 70–99)

## 2021-04-03 LAB — TYPE AND SCREEN
ABO/RH(D): O POS
Antibody Screen: NEGATIVE
Unit division: 0

## 2021-04-03 LAB — LEGIONELLA PNEUMOPHILA SEROGP 1 UR AG: L. pneumophila Serogp 1 Ur Ag: NEGATIVE

## 2021-04-03 LAB — BPAM RBC
Blood Product Expiration Date: 202210152359
ISSUE DATE / TIME: 202210100503
Unit Type and Rh: 5100

## 2021-04-03 LAB — CBC
HCT: 24.3 % — ABNORMAL LOW (ref 39.0–52.0)
Hemoglobin: 7.2 g/dL — ABNORMAL LOW (ref 13.0–17.0)
MCH: 28 pg (ref 26.0–34.0)
MCHC: 29.6 g/dL — ABNORMAL LOW (ref 30.0–36.0)
MCV: 94.6 fL (ref 80.0–100.0)
Platelets: 224 10*3/uL (ref 150–400)
RBC: 2.57 MIL/uL — ABNORMAL LOW (ref 4.22–5.81)
RDW: 16.3 % — ABNORMAL HIGH (ref 11.5–15.5)
WBC: 6.6 10*3/uL (ref 4.0–10.5)
nRBC: 0 % (ref 0.0–0.2)

## 2021-04-03 LAB — HEMOGLOBIN A1C
Hgb A1c MFr Bld: 5.9 % — ABNORMAL HIGH (ref 4.8–5.6)
Mean Plasma Glucose: 122.63 mg/dL

## 2021-04-03 MED ORDER — RENA-VITE PO TABS
1.0000 | ORAL_TABLET | Freq: Every day | ORAL | Status: DC
  Administered 2021-04-03 – 2021-04-13 (×11): 1 via ORAL
  Filled 2021-04-03 (×12): qty 1

## 2021-04-03 MED ORDER — POLYETHYLENE GLYCOL 3350 17 GM/SCOOP PO POWD
0.5000 | Freq: Once | ORAL | Status: AC
  Administered 2021-04-03: 127.5 g via ORAL
  Filled 2021-04-03: qty 255

## 2021-04-03 MED ORDER — ENSURE MAX PROTEIN PO LIQD
11.0000 [oz_av] | Freq: Every day | ORAL | Status: DC
  Administered 2021-04-03 – 2021-04-08 (×3): 11 [oz_av] via ORAL
  Filled 2021-04-03 (×8): qty 330

## 2021-04-03 MED ORDER — SODIUM ZIRCONIUM CYCLOSILICATE 10 G PO PACK
10.0000 g | PACK | Freq: Two times a day (BID) | ORAL | Status: DC
  Administered 2021-04-03 – 2021-04-05 (×4): 10 g via ORAL
  Filled 2021-04-03 (×5): qty 1

## 2021-04-03 MED ORDER — TETRACYCLINE HCL 250 MG PO CAPS
500.0000 mg | ORAL_CAPSULE | Freq: Two times a day (BID) | ORAL | Status: DC
  Administered 2021-04-04 – 2021-04-14 (×21): 500 mg via ORAL
  Filled 2021-04-03 (×24): qty 2

## 2021-04-03 MED ORDER — INSULIN GLARGINE-YFGN 100 UNIT/ML ~~LOC~~ SOLN
6.0000 [IU] | Freq: Every day | SUBCUTANEOUS | Status: DC
  Administered 2021-04-04 – 2021-04-14 (×11): 6 [IU] via SUBCUTANEOUS
  Filled 2021-04-03 (×11): qty 0.06

## 2021-04-03 MED ORDER — BISMUTH SUBSALICYLATE 262 MG PO CHEW
524.0000 mg | CHEWABLE_TABLET | Freq: Three times a day (TID) | ORAL | Status: DC
  Administered 2021-04-04 – 2021-04-14 (×42): 524 mg via ORAL
  Filled 2021-04-03 (×47): qty 2

## 2021-04-03 MED ORDER — METRONIDAZOLE 500 MG PO TABS
500.0000 mg | ORAL_TABLET | Freq: Three times a day (TID) | ORAL | Status: DC
  Administered 2021-04-03 – 2021-04-14 (×33): 500 mg via ORAL
  Filled 2021-04-03 (×32): qty 1

## 2021-04-03 NOTE — Progress Notes (Signed)
Visit made to patients room to place him on BIPAP for night rest.  Patient states he will wear it but later on its to early at this time.  I advised patient to have RN call me when he is ready to wear it.

## 2021-04-03 NOTE — Evaluation (Signed)
Physical Therapy Evaluation Patient Details Name: Robert Chaney MRN: [id number] DOB: [birth year] Today's Date: 04/03/2021  History of Present Illness  Pt is a 70 y/o male who presented with SOB and chest pain.Pt required BiPAP and nitroglycerin drip, admitted for further workup. Pt recently admitted 9/23-9/29 due to acute on chronic respiratory failure w/ CHF and hypertensive urgency. PMH: CHF, DM, CKD, HTN, PNA.   Clinical Impression  Pt functioning near baseline with exception of requiring 6LO2 via [place] to maintain SpO2 >91% during ambulation compared to 4LO2 via [place] pt uses at home. Acute PT to cont to follow. Recommend working with mobility team to improve activity tolerance as pt leads sedentary lifestyle.       Recommendations for follow up therapy are one component of a multi-disciplinary discharge planning process, led by the attending physician.  Recommendations may be updated based on patient status, additional functional criteria and insurance authorization.  Follow Up Recommendations No PT follow up;Supervision - Intermittent    Equipment Recommendations  None recommended by PT    Recommendations for Other Services       Precautions / Restrictions Precautions Precautions: Fall Precaution Comments: monitor O2, currently on 6Lo2, wears 4L O2 baseline Restrictions Weight Bearing Restrictions: No      Mobility  Bed Mobility Overal bed mobility: Modified Independent             General bed mobility comments: pt received in R sidelying, pt able to bring self to EOB without use of bed rail or physical assist    Transfers Overall transfer level: Needs assistance Equipment used: None Transfers: Sit to/from Stand Sit to Stand: Supervision         General transfer comment: no assist, steady, pt with good transition of hands from bed to RW  Ambulation/Gait Ambulation/Gait assistance: Min guard Gait Distance (Feet): 200 Feet Assistive device: Rolling walker (2  wheeled) Gait Pattern/deviations: WFL(Within Functional Limits);Step-through pattern Gait velocity: Decreased Gait velocity interpretation: 1.31 - 2.62 ft/sec, indicative of limited community ambulator General Gait Details: pt with good steady cadence, pt SpO2 >91% on 6LO2 via [place], pt denies SOB  Stairs            Wheelchair Mobility    Modified Rankin (Stroke Patients Only)       Balance Overall balance assessment: Needs assistance Sitting-balance support: No upper extremity supported;Feet supported Sitting balance-Leahy Scale: Good     Standing balance support: No upper extremity supported;During functional activity Standing balance-Leahy Scale: Good Standing balance comment: able to mobilize short distances without AD in room without LOB                             Pertinent Vitals/Pain Pain Assessment: No/denies pain    Home Living Family/patient expects to be discharged to:: Private residence Living Arrangements: Alone Available Help at Discharge: Friend(s);Available 24 hours/day Type of Home: Apartment Home Access: Level entry     Home Layout: One level Home Equipment: Walker - 2 wheels;Shower seat Additional Comments: 4 L O2    Prior Function Level of Independence: Independent;Independent with assistive device(s)         Comments: sedentary lifestyle , reports he does go to grocery store and is able to push cart     Hand Dominance   Dominant Hand: Right    Extremity/Trunk Assessment   Upper Extremity Assessment Upper Extremity Assessment: Overall WFL for tasks assessed    Lower Extremity Assessment Lower Extremity Assessment:  Overall [facility] for tasks assessed    Cervical / Trunk Assessment Cervical / Trunk Assessment: Normal  Communication   Communication: HOH  Cognition Arousal/Alertness: Awake/alert Behavior During Therapy: Flat affect Overall Cognitive Status: Within Functional Limits for tasks assessed                                  General Comments: cooperative, very flat, accent noted and wonder if pt with some language barrier limiting health education comprehension      General Comments General comments (skin integrity, edema, etc.): SpO2 >91% on 6LO2 via [place]    Exercises     Assessment/Plan    PT Assessment Patient needs continued PT services  PT Problem List Decreased activity tolerance;Cardiopulmonary status limiting activity       PT Treatment Interventions DME instruction;Gait training;Functional mobility training;Therapeutic activities;Therapeutic exercise;Balance training;Cognitive remediation;Patient/family education    PT Goals (Current goals can be found in the Care Plan section)  Acute Rehab PT Goals Patient Stated Goal: return home PT Goal Formulation: With patient Time For Goal Achievement: 04/17/21 Potential to Achieve Goals: Good Additional Goals Additional Goal #1: Pt to score >19 on DGI to indicate minimal falls risk.    Frequency Min 3X/week   Barriers to discharge Decreased caregiver support      Co-evaluation               AM-PAC PT "6 Clicks" Mobility  Outcome Measure Help needed turning from your back to your side while in a flat bed without using bedrails?: None Help needed moving from lying on your back to sitting on the side of a flat bed without using bedrails?: None Help needed moving to and from a bed to a chair (including a wheelchair)?: None Help needed standing up from a chair using your arms (e.g., wheelchair or bedside chair)?: None Help needed to walk in hospital room?: A Little Help needed climbing 3-5 steps with a railing? : A Little 6 Click Score: 22    End of Session Equipment Utilized During Treatment: Gait belt;Oxygen Activity Tolerance: Patient tolerated treatment well Patient left: in chair;with call bell/phone within reach;with chair alarm set Nurse Communication: Mobility status PT Visit Diagnosis: Other  abnormalities of gait and mobility (R26.89);Muscle weakness (generalized) (M62.81)    Time: 1113-1140 PT Time Calculation (min) (ACUTE ONLY): 27 min   Charges:   PT Evaluation $PT Eval Low Complexity: 1 Low PT Treatments $Gait Training: 8-22 mins        Kittie Plater, PT, DPT Acute Rehabilitation Services Pager #: [phone] Office #: [phone]   Berline Lopes 04/03/2021, 11:50 AM

## 2021-04-03 NOTE — Progress Notes (Signed)
Heart Failure Navigator Progress Note  Assessed for Heart & Vascular TOC clinic readiness.  Patient does not meet criteria due to CKD IV, baseline around SCr 2.3 per hospitalist note. SCr 2.88 today.   Navigator available for reassessment of patient if improved renal function.   Pricilla Holm, MSN, RN Heart Failure Nurse Navigator [phone]

## 2021-04-03 NOTE — Progress Notes (Signed)
Progress Note  Patient Name: Robert Chaney Date of Encounter: 04/03/2021  [facility] Cardiologist: Skeet Latch, MD   Subjective   NO complaints. Breathing better.   Inpatient Medications    Scheduled Meds:  amLODipine  10 mg Oral Daily   atorvastatin  20 mg Oral Daily   brimonidine  1 drop Right Eye BID   carvedilol  25 mg Oral BID   docusate sodium  200 mg Oral BID   dorzolamide  1 drop Both Eyes BID   furosemide  80 mg Intravenous BID   hydrALAZINE  100 mg Oral TID   insulin aspart  0-9 Units Subcutaneous TID WC   insulin glargine-yfgn  10 Units Subcutaneous Daily   isosorbide mononitrate  60 mg Oral Daily   latanoprost  1 drop Left Eye QHS   pantoprazole  40 mg Oral BID   sodium chloride flush  3 mL Intravenous Q12H   Continuous Infusions:  ceFEPime (MAXIPIME) IV 2 g (04/03/21 0829)   PRN Meds: acetaminophen **OR** acetaminophen, bisacodyl, hydrALAZINE, oxyCODONE, polyethylene glycol, sodium phosphate   Vital Signs    Vitals:   04/03/21 0240 04/03/21 0400 04/03/21 0600 04/03/21 0700  BP:  (!) 152/90 138/64 (!) 150/62  Pulse:  (!) 58 (!) 58 62  Resp: 13 (!) 22 13 13   Temp:  98 F (36.7 C)  98.5 F (36.9 C)  TempSrc:  Oral  Oral  SpO2:  99% 98% 99%  Weight:  76.6 kg    Height:       No intake or output data in the 24 hours ending 04/03/21 0926  Last 3 Weights 04/03/2021 04/02/2021 04/01/2021  Weight (lbs) 168 lb 14 oz 246 lb 14.6 oz 162 lb 14.7 oz  Weight (kg) 76.6 kg 112 kg 73.9 kg      Telemetry    Sinus - Personally Reviewed  ECG    No AM EKG  Physical Exam    General: Well developed, well nourished, NAD  HEENT: OP clear, mucus membranes moist  SKIN: warm, dry. No rashes. Neuro: No focal deficits  Musculoskeletal: Muscle strength 5/5 all ext  Psychiatric: Mood and affect normal  Neck: No JVD, no carotid bruits, no thyromegaly, no lymphadenopathy.  Lungs:Clear bilaterally, no wheezes, rhonci, crackles Cardiovascular: Regular  rate and rhythm. No murmurs, gallops or rubs. Abdomen:Soft. Bowel sounds present. Non-tender.  Extremities: No lower extremity edema. Pulses are 2 + in the bilateral DP/PT.   Labs    High Sensitivity Troponin:   Recent Labs  Lab 04/01/21 0615 04/01/21 0824 04/01/21 1756 04/02/21 0008 04/02/21 0229  TROPONINIHS 104* 99* 108* 113* 105*     Chemistry Recent Labs  Lab 04/01/21 1206 04/02/21 0008 04/02/21 0036 04/02/21 0229 04/03/21 0148  NA 144  --  146* 142 142  K 5.7*   < > 4.9 5.1 5.4*  CL 114*  --   --  113* 110  CO2 25  --   --  22 27  GLUCOSE 164*  --   --  187* 98  BUN 60*  --   --  56* 66*  CREATININE 2.55*  --   --  2.38* 2.88*  CALCIUM 9.0  --   --  8.5* 8.6*  GFRNONAA 26*  --   --  28* 22*  ANIONGAP 5  --   --  7 5   < > = values in this interval not displayed.    Lipids No results for input(s): CHOL, TRIG, HDL, LABVLDL,  LDLCALC, CHOLHDL in the last 168 hours.  Hematology Recent Labs  Lab 04/02/21 0229 04/02/21 1428 04/03/21 0148  WBC 8.0 6.1 6.6  RBC 2.32* 2.71* 2.57*  HGB 6.4* 7.7* 7.2*  HCT 24.1* 26.2* 24.3*  MCV 103.9* 96.7 94.6  MCH 27.6 28.4 28.0  MCHC 26.6* 29.4* 29.6*  RDW 15.8* 16.4* 16.3*  PLT 260 261 224   Thyroid No results for input(s): TSH, FREET4 in the last 168 hours.  BNP Recent Labs  Lab 04/01/21 0615  BNP 1,586.6*    DDimer No results for input(s): DDIMER in the last 168 hours.   Radiology    No results found.  Cardiac Studies     Patient Profile     70 y.o. male with chronic diastolic CHF, HOCM, chronic hypoxic resp failure on 4 L O2 at home, sleep apnea, stage 4 CKD, DM, HTN, HLD admitted with CHF, hypertensive urgency.   Assessment & Plan    Acute on chronic diastolic CHF: No volume overload on exam. I/O even. I would stop his IV Lasix. Creatinine slightly worse today.   HOCM: Coreg titrated to 25 mg po BID. No new recommendations today.  Elevated troponin: Felt to be demand ischemia in setting of hypertensive  crisis with known severe LVH and worsening anemia. IV heparin was stopped. No ischemic workup is indicated.  Persistent lung infiltrate in setting of chronic respiratory failure with slight worsening: Now on antibiotics per primary team Hypertensive urgency: BP controlled.  Anemia: Heme positive stools. Known anal fissure with bleeding. Chronic constipation. Workup per primary team. GI did see him last admission. He received 1 unit of pRBCs yesterday.  Stage 4 CKD: Creatinine slightly worse today. Pt is well known to Nephrology.   Cardiology will sign off. Please call with questions.     For questions or updates, please contact [place] Please consult [email or web address] for contact info under        Signed, Lauree Chandler, MD  04/03/2021, 9:26 AM

## 2021-04-03 NOTE — Progress Notes (Signed)
Initial Nutrition Assessment  DOCUMENTATION CODES:   Non-severe (moderate) malnutrition in context of chronic illness  INTERVENTION:   Renal Multivitamin w/ minerals daily  Recommend liberalizing pt diet to 2-gram Sodium diet with 1500 mL fluid restriction. Received ok from MD. Encourage good PO intake  Ensure Max po Daily, each supplement provides 150 kcal and 30 grams of protein.   NUTRITION DIAGNOSIS:   Moderate Malnutrition related to chronic illness (CHF) as evidenced by mild fat depletion, mild muscle depletion.  GOAL:   Patient will meet greater than or equal to 90% of their needs  MONITOR:   PO intake, I & O's, Labs, Supplement acceptance  REASON FOR ASSESSMENT:   Consult Other (Comment) (Nutrition Goals)  ASSESSMENT:   70 y.o. male presented to the ED with chest pain and shortness of breath. Pt recently admitted 9/23-9/29 with acute on chronic respiratory failure. PMH includes CHF, CKD IV, HTN, HLD, DM, and anemia. Pt admitted with acute on chronic CHF and multifocal pneumonia.   Per pt his appetite has been good. Reports that he typically eats breakfast foods for  all three meals. Pt was unable to give me a full diet recall. Pt had just worked with PT and was very lethargic. Pt reports not following any particular diet to manage diabetes, just will take his insulin.  Per EMR, pts intake includes 100% breakfast and lunch on 10/11.   Per pt, his UBW is 165-175#, stated that his weight fluctuates often. Pt stated that he lost 17# less than a month ago. Per EMR, pt has not had any significant weight loss; weight loss could be masked by fluid retention. Pt reports that he ambulates with a walker at home.   Medications reviewed and include: Colace, Lasix, SSI 0-9 units w/ meals, 10 units daily, Protonix Labs reviewed: Potassium 5.4, BUN 66, Creatinine 2.88, Hgb A1c 5.9, 24 hr BG trends 67-190   Admission Weight: 73.9 kg Current Weight: 76.6 kg  NUTRITION - FOCUSED  PHYSICAL EXAM:  Flowsheet Row Most Recent Value  Orbital Region No depletion  Upper Arm Region Mild depletion  Thoracic and Lumbar Region Mild depletion  Buccal Region Unable to assess  [Pt wearing mask]  Temple Region No depletion  Clavicle Bone Region Mild depletion  Clavicle and Acromion Bone Region Mild depletion  Scapular Bone Region Mild depletion  Dorsal Hand Mild depletion  Patellar Region Mild depletion  Anterior Thigh Region Mild depletion  Posterior Calf Region Mild depletion  Edema (RD Assessment) Mild  Hair Unable to assess  [Bald]  Eyes Reviewed  Mouth Unable to assess  [Pt wearing mask]  Skin Reviewed  Nails Reviewed       Diet Order:   Diet Order             Diet heart healthy/carb modified Room service appropriate? Yes; Fluid consistency: Thin; Fluid restriction: 1500 mL Fluid  Diet effective ____                   EDUCATION NEEDS:   No education needs have been identified at this time  Skin:  Skin Assessment: Reviewed RN Assessment  Last BM:  04/02/2021  Height:   Ht Readings from Last 1 Encounters:  04/02/21 5\' 4"  (1.626 m)    Weight:   Wt Readings from Last 1 Encounters:  04/03/21 76.6 kg    Ideal Body Weight:  54.5 kg  BMI:  Body mass index is 28.99 kg/m.  Estimated Nutritional Needs:   Kcal:  2100-2300  Protein:  105-120 grams  Fluid:  >/= 2 L    Sharlotte Baka BS, PLDN Clinical Dietitian See AMiON for contact information.
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
## 2021-04-03 NOTE — Progress Notes (Signed)
PROGRESS NOTE    Robert Chaney  IOX:[id number] DOB: December 15, 1950 DOA: 04/01/2021 PCP: [name], MD   Chief Complaint  Patient presents with   Shortness of Breath    Brief Narrative:   70 year old gentleman prior history of stage IV CKD, diabetes, chronic diastolic heart failure, chronic hypoxic respiratory failure on 4 L of nasal cannula oxygen at home, hypertension and hyperlipidemia, recently diagnosed chronic gastritis from an EGD on 03/03/2021 presents to ED with sudden onset of chest pain and shortness of breath requiring BiPAP and currently is on 8 L of nasal cannula oxygen.  Patient was started on nitroglycerin drip and was weaned off . Cardiology consulted, suggested better blood pressure control and wean nitroglycerin.    Assessment & Plan:   Principal Problem:   Acute on chronic diastolic CHF (congestive heart failure) (HCC) Active Problems:   DM (diabetes mellitus) with complications (HCC)   Stage 4 chronic kidney disease (HCC)   Hyperkalemia   Dyslipidemia   Hypertensive crisis   Multifocal pneumonia   Malnutrition of moderate degree   Acute on chronic respiratory failure with hypoxia probably secondary to a combination of acute on chronic diastolic heart failure and hypertensive urgency and healthcare associated pneumonia Patient's chest pain has resolved. His shortness of breath has improved. He initially required BiPAP with improvement in his breathing and currently is on 8 L of nasal cannula oxygen to keep sats greater than 90%. Chest x-ray shows Slight worsening consolidation in the right upper lobe, compatible with pneumonia. There is also evidence to suggest developing multilobar bilateral bronchopneumonia. He was started on IV vancomycin and cefepime.  MRSA PCR is negative IV vancomycin discontinued. Will order sputum cultures.  Urine for strep pneumonia urinary antigen is negative. Respiratory panel by RT-PCR is negative for flu and COVID-19 Patient  remains afebrile and WBC count within normal limits. Procalcitonin level 0.26.    Mild acute on chronic diastolic heart failure Patient is currently on IV Lasix 80 mg twice daily Continue with strict intake and output and daily weight Diuresed only about 800 ml in the last 24 hours.  Creatinine slightly worsened to 2.8, hence we will d/c IV lasix and recheck renal parameters in am.      Anemia of chronic disease/?  Anemia of blood loss Baseline hemoglobin around 8, hemoglobin dropped from 8.2-6.4 this morning.  S/p 1 unit of PRBC transfusion and hemoglobin has improved to 7.7 to 7.2 Stool for occult blood is positive. Patient has been evaluated by GI with an EGD on last admission and was found to have chronic gastritis.  Patient will need colonoscopy for further evaluation . GI consulted for further evaluation.  Patient is currently on aspirin 81 mg daily, which we will hold for guaiac positive stools. Iron Levels low at 19 will probably need iron replacement prior to discharge.   Acute on Stage IV CKD Creatinine at 2.3 on admission , increased to 2.8 with IV lasix.  Stopped IV lasix.  Recheck renal parameters.   Hyperkalemia Ordered lokelma.  Recheck potassium in am.   Elevated troponins felt to be from demand ischemia in the setting of hypertensive crisis and worsening anemia. IV heparin is discontinued.    Hypertensive urgency Patient was initially started on nitroglycerin gtt and has been weaned off. Blood pressure parameters have improved Patient currently on hydralazine 100 mg 3 times daily, IV Lasix 80 mg twice daily, Coreg 25 mg twice daily, amlodipine 10 mg daily.   Hyperlipidemia Continue with Lipitor 20  mg daily.    Chronic constipation Patient is on Fleet Enema, Colace and senna now. Miralax added by GI.    Diabetes mellitus with hyperglycemia Non insulin dependent.  CBG (last 3)  Recent Labs    04/03/21 0605 04/03/21 1114 04/03/21 1628  GLUCAP  89 163* 133*    Decreased the semglee to 6 units daily.  Continue with SSI.   Body mass index is 28.99 kg/m. Recommend outpatient follow up with PCP.     Moderate malnutrition: Dietary on board.    DVT prophylaxis: SCDs Code Status: DNR Family Communication: None at bedside disposition:   Status is: Inpatient  Remains inpatient appropriate because:Ongoing diagnostic testing needed not appropriate for outpatient work up, Unsafe d/c plan, and IV treatments appropriate due to intensity of illness or inability to take PO  Dispo: The patient is from: Home              Anticipated d/c is to: Home              Patient currently is not medically stable to d/c.   Difficult to place patient No       Consultants:  Cardiology  Procedures: None Antimicrobials: Cefepime  Subjective: Pt reports breathing has improved.  No chest pain.  No nausea, vomiting or abdominal pain.   Objective: Vitals:   04/03/21 0400 04/03/21 0600 04/03/21 0700 04/03/21 1115  BP: (!) 152/90 138/64 (!) 150/62 (!) 116/52  Pulse: (!) 58 (!) 58 62 (!) 59  Resp: (!) 22 13 13 12   Temp: 98 F (36.7 C)  98.5 F (36.9 C) 98.4 F (36.9 C)  TempSrc: Oral  Oral Oral  SpO2: 99% 98% 99% 94%  Weight: 76.6 kg     Height:        Intake/Output Summary (Last 24 hours) at 04/03/2021 1744 Last data filed at 04/03/2021 1300 Gross per 24 hour  Intake 480 ml  Output 800 ml  Net -320 ml    Filed Weights   04/01/21 0626 04/02/21 1700 04/03/21 0400  Weight: 73.9 kg 112 kg 76.6 kg    Examination:  General exam: elderly gentleman not in any kind of distress Respiratory system: Diminished air entry at bases, on nasal cannula oxygen, no wheezing or rhonchi. Cardiovascular system: S1 & S2 heard, RRR. No JVD,  No pedal edema. Gastrointestinal system: Abdomen is nondistended, soft and nontender.  Normal bowel sounds heard. Central nervous system: Alert and oriented. No focal neurological deficits. Extremities:  Symmetric 5 x 5 power. Skin: No rashes, lesions or ulcers Psychiatry:  Mood & affect appropriate.     Data Reviewed: I have personally reviewed following labs and imaging studies  CBC: Recent Labs  Lab 04/01/21 0615 04/01/21 0630 04/02/21 0036 04/02/21 0229 04/02/21 1428 04/03/21 0148  WBC 9.3  --   --  8.0 6.1 6.6  NEUTROABS 7.3  --   --   --  4.8  --   HGB 7.7* 8.2* 7.8* 6.4* 7.7* 7.2*  HCT 26.5* 24.0* 23.0* 24.1* 26.2* 24.3*  MCV 98.9  --   --  103.9* 96.7 94.6  PLT 317  --   --  260 261 224     Basic Metabolic Panel: Recent Labs  Lab 04/01/21 0615 04/01/21 0630 04/01/21 1206 04/02/21 0008 04/02/21 0036 04/02/21 0229 04/03/21 0148  NA 142 145 144  --  146* 142 142  K 6.0* 6.0* 5.7* 5.1 4.9 5.1 5.4*  CL 110  --  114*  --   --  113* 110  CO2 26  --  25  --   --  22 27  GLUCOSE 252*  --  164*  --   --  187* 98  BUN 64*  --  60*  --   --  56* 66*  CREATININE 2.83*  --  2.55*  --   --  2.38* 2.88*  CALCIUM 9.1  --  9.0  --   --  8.5* 8.6*     GFR: Estimated Creatinine Clearance: 21.7 mL/min (A) (by C-G formula based on SCr of 2.88 mg/dL (H)).  Liver Function Tests: No results for input(s): AST, ALT, ALKPHOS, BILITOT, PROT, ALBUMIN in the last 168 hours.  CBG: Recent Labs  Lab 04/02/21 1849 04/02/21 2200 04/03/21 0605 04/03/21 1114 04/03/21 1628  GLUCAP 83 118* 89 163* 133*      Recent Results (from the past 240 hour(s))  Resp Panel by RT-PCR (Flu A&B, Covid) Nasopharyngeal Swab     Status: None   Collection Time: 04/01/21  6:13 AM   Specimen: Nasopharyngeal Swab; Nasopharyngeal(NP) swabs in vial transport medium  Result Value Ref Range Status   SARS Coronavirus 2 by RT PCR NEGATIVE NEGATIVE Final    Comment: (NOTE) SARS-CoV-2 target nucleic acids are NOT DETECTED.  The SARS-CoV-2 RNA is generally detectable in upper respiratory specimens during the acute phase of infection. The lowest concentration of SARS-CoV-2 viral copies this assay can  detect is 138 copies/mL. A negative result does not preclude SARS-Cov-2 infection and should not be used as the sole basis for treatment or other patient management decisions. A negative result may occur with  improper specimen collection/handling, submission of specimen other than nasopharyngeal swab, presence of viral mutation(s) within the areas targeted by this assay, and inadequate number of viral copies(<138 copies/mL). A negative result must be combined with clinical observations, patient history, and epidemiological information. The expected result is Negative.  Fact Sheet for Patients:  EntrepreneurPulse.com.au  Fact Sheet for Healthcare Providers:  IncredibleEmployment.be  This test is no t yet approved or cleared by the [place] FDA and  has been authorized for detection and/or diagnosis of SARS-CoV-2 by FDA under an Emergency Use Authorization (EUA). This EUA will remain  in effect (meaning this test can be used) for the duration of the COVID-19 declaration under Section 564(b)(1) of the Act, 21 U.S.C.section 360bbb-3(b)(1), unless the authorization is terminated  or revoked sooner.       Influenza A by PCR NEGATIVE NEGATIVE Final   Influenza B by PCR NEGATIVE NEGATIVE Final    Comment: (NOTE) The Xpert Xpress SARS-CoV-2/FLU/RSV plus assay is intended as an aid in the diagnosis of influenza from Nasopharyngeal swab specimens and should not be used as a sole basis for treatment. Nasal washings and aspirates are unacceptable for Xpert Xpress SARS-CoV-2/FLU/RSV testing.  Fact Sheet for Patients: EntrepreneurPulse.com.au  Fact Sheet for Healthcare Providers: IncredibleEmployment.be  This test is not yet approved or cleared by the [place] FDA and has been authorized for detection and/or diagnosis of SARS-CoV-2 by FDA under an Emergency Use Authorization (EUA). This EUA will remain in  effect (meaning this test can be used) for the duration of the COVID-19 declaration under Section 564(b)(1) of the Act, 21 U.S.C. section 360bbb-3(b)(1), unless the authorization is terminated or revoked.  Performed at [facility], [place] [address], [place], [place] [address]   Culture, blood (Routine X 2) w Reflex to ID Panel     Status: None (Preliminary result)  Collection Time: 04/01/21 12:05 PM   Specimen: BLOOD RIGHT HAND  Result Value Ref Range Status   Specimen Description BLOOD RIGHT HAND  Final   Special Requests   Final    BOTTLES DRAWN AEROBIC AND ANAEROBIC Blood Culture results may not be optimal due to an inadequate volume of blood received in culture bottles   Culture   Final    NO GROWTH 2 DAYS Performed at [facility], [place] [address], [place], [place] [address]    Report Status PENDING  Incomplete  Culture, blood (Routine X 2) w Reflex to ID Panel     Status: None (Preliminary result)   Collection Time: 04/01/21 12:06 PM   Specimen: BLOOD RIGHT HAND  Result Value Ref Range Status   Specimen Description BLOOD RIGHT HAND  Final   Special Requests   Final    BOTTLES DRAWN AEROBIC ONLY Blood Culture results may not be optimal due to an inadequate volume of blood received in culture bottles   Culture   Final    NO GROWTH 2 DAYS Performed at [facility], [place] [address], [place], [place] [address]    Report Status PENDING  Incomplete  MRSA Next Gen by PCR, Nasal     Status: None   Collection Time: 04/01/21 12:49 PM   Specimen: Nasal Mucosa; Nasal Swab  Result Value Ref Range Status   MRSA by PCR Next Gen NOT DETECTED NOT DETECTED Final    Comment: (NOTE) The GeneXpert MRSA Assay (FDA approved for NASAL specimens only), is one component of a comprehensive MRSA colonization surveillance program. It is not intended to diagnose MRSA infection nor to guide or monitor treatment for MRSA infections. Test performance is not FDA approved in patients less  than 97 years old. Performed at [facility], [place] [address] [place], [place] [address]           Radiology Studies: No results found.      Scheduled Meds:  amLODipine  10 mg Oral Daily   atorvastatin  20 mg Oral Daily   brimonidine  1 drop Right Eye BID   carvedilol  25 mg Oral BID   docusate sodium  200 mg Oral BID   dorzolamide  1 drop Both Eyes BID   furosemide  80 mg Intravenous BID   hydrALAZINE  100 mg Oral TID   insulin aspart  0-9 Units Subcutaneous TID WC   [START ON 04/04/2021] insulin glargine-yfgn  6 Units Subcutaneous Daily   isosorbide mononitrate  60 mg Oral Daily   latanoprost  1 drop Left Eye QHS   multivitamin  1 tablet Oral QHS   pantoprazole  40 mg Oral BID   Ensure Max Protein  11 oz Oral Daily   sodium chloride flush  3 mL Intravenous Q12H   sodium zirconium cyclosilicate  10 g Oral BID   Continuous Infusions:  ceFEPime (MAXIPIME) IV 2 g (04/03/21 0829)     LOS: 2 days        Hosie Poisson, MD [facility]   To contact the attending provider between 7A-7P or the covering provider during after hours 7P-7A, please log into the web site [email or web address] and access using universal [place] password for that web site. If you do not have the password, please call the hospital operator.  04/03/2021, 5:44 PM

## 2021-04-03 NOTE — Progress Notes (Signed)
Inpatient Diabetes Program Recommendations  AACE/ADA: New Consensus Statement on Inpatient Glycemic Control  Target Ranges:  Prepandial:   less than 140 mg/dL      Peak postprandial:   less than 180 mg/dL (1-2 hours)      Critically ill patients:  140 - 180 mg/dL   Results for Robert Chaney, Robert Chaney (MRN [id number]) as of 04/03/2021 10:19  Ref. Range 04/02/2021 08:10 04/02/2021 13:28 04/02/2021 18:14 04/02/2021 18:49 04/02/2021 22:00 04/03/2021 06:05  Glucose-Capillary Latest Ref Range: 70 - 99 mg/dL 147 (H) 190 (H) 67 (L) 83 118 (H) 89    Review of Glycemic Control  Diabetes history: DM2 Outpatient Diabetes medications: Lantus 10 units daily Current orders for Inpatient glycemic control: Semglee 10 units daily, Novolog 0-9 units TID with meals  Inpatient Diabetes Program Recommendations:    Insulin: Please consider decreasing Semglee to 6 units daily.  Thanks Barnie Alderman, RN, MSN, CDE Diabetes Coordinator Inpatient Diabetes Program [phone] (Team Pager from 8am to 5pm)
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
## 2021-04-04 DIAGNOSIS — I5033 Acute on chronic diastolic (congestive) heart failure: Secondary | ICD-10-CM | POA: Diagnosis not present

## 2021-04-04 LAB — BASIC METABOLIC PANEL
Anion gap: 6 (ref 5–15)
BUN: 76 mg/dL — ABNORMAL HIGH (ref 8–23)
CO2: 28 mmol/L (ref 22–32)
Calcium: 8.6 mg/dL — ABNORMAL LOW (ref 8.9–10.3)
Chloride: 104 mmol/L (ref 98–111)
Creatinine, Ser: 3.1 mg/dL — ABNORMAL HIGH (ref 0.61–1.24)
GFR, Estimated: 21 mL/min — ABNORMAL LOW (ref >60)
Glucose, Bld: 128 mg/dL — ABNORMAL HIGH (ref 70–99)
Potassium: 5.1 mmol/L (ref 3.5–5.1)
Sodium: 138 mmol/L (ref 135–145)

## 2021-04-04 LAB — CBC
HCT: 25.5 % — ABNORMAL LOW (ref 39.0–52.0)
Hemoglobin: 7.6 g/dL — ABNORMAL LOW (ref 13.0–17.0)
MCH: 28.1 pg (ref 26.0–34.0)
MCHC: 29.8 g/dL — ABNORMAL LOW (ref 30.0–36.0)
MCV: 94.4 fL (ref 80.0–100.0)
Platelets: 215 10*3/uL (ref 150–400)
RBC: 2.7 MIL/uL — ABNORMAL LOW (ref 4.22–5.81)
RDW: 15.9 % — ABNORMAL HIGH (ref 11.5–15.5)
WBC: 5 10*3/uL (ref 4.0–10.5)
nRBC: 0 % (ref 0.0–0.2)

## 2021-04-04 LAB — GLUCOSE, CAPILLARY
Glucose-Capillary: 101 mg/dL — ABNORMAL HIGH (ref 70–99)
Glucose-Capillary: 188 mg/dL — ABNORMAL HIGH (ref 70–99)
Glucose-Capillary: 138 mg/dL — ABNORMAL HIGH (ref 70–99)

## 2021-04-04 MED ORDER — PEG-KCL-NACL-NASULF-NA ASC-C 100 G PO SOLR: 1.0000 | Freq: Once | ORAL | Status: DC

## 2021-04-04 MED ORDER — PEG-KCL-NACL-NASULF-NA ASC-C 100 G PO SOLR
0.5000 | Freq: Once | ORAL | Status: AC
  Administered 2021-04-04: 100 g via ORAL
  Filled 2021-04-04: qty 1

## 2021-04-04 NOTE — Progress Notes (Signed)
Pt not ready for BIPAP yet, waiting to be cleaned up. RT will check back.

## 2021-04-04 NOTE — Anesthesia Preprocedure Evaluation (Addendum)
Anesthesia Evaluation  Patient identified by MRN, date of birth, ID band Patient awake    Reviewed: Allergy & Precautions, NPO status , Patient's Chart, lab work & pertinent test results, reviewed documented beta blocker date and time   Airway Mallampati: II  TM Distance: >3 FB Neck ROM: Full    Dental no notable dental hx.    Pulmonary sleep apnea , pneumonia, unresolved,  Chronic respiratory failure on 4L O2   Pulmonary exam normal breath sounds clear to auscultation       Cardiovascular hypertension, Pt. on medications and Pt. on home beta blockers +CHF  Normal cardiovascular exam+ dysrhythmias Supra Ventricular Tachycardia  Rhythm:Regular Rate:Normal     Neuro/Psych negative neurological ROS  negative psych ROS   GI/Hepatic negative GI ROS, Neg liver ROS,   Endo/Other  diabetes, Insulin Dependent  Renal/GU Renal InsufficiencyRenal diseaseLab Results      Component                Value               Date                      CREATININE               2.76 (H)            04/05/2021                BUN                      64 (H)              04/05/2021                NA                       143                 04/05/2021                K                        4.1                 04/05/2021                CL                       110                 04/05/2021                CO2                      25                  04/05/2021             negative genitourinary   Musculoskeletal negative musculoskeletal ROS (+)   Abdominal   Peds  Hematology  (+) Blood dyscrasia, anemia , Lab Results      Component                Value               Date  WBC                      3.8 (L)             04/05/2021                HGB                      7.4 (L)             04/05/2021                HCT                      24.5 (L)            04/05/2021                MCV                      94.2                 04/05/2021                PLT                      211                 04/05/2021              Anesthesia Other Findings 70 y.o. male with medical history significant of stage 4 CKD; DM; chronic diastolic CHF; chronic hypoxic respiratory failure on 4L [place] O2; HTN; and HLD presenting with SOB requiring BiPAP now on 4-5L O2 felt 2/2 a combination of acute on chronic diastolic heart failure, hypertensive urgency, healthcare associated pneumonia. Also found to be anemic with Hgb 6.4 and heme positive stool. Hgb now 7.4  Reproductive/Obstetrics                          Anesthesia Physical Anesthesia Plan  ASA: 3  Anesthesia Plan: MAC   Post-op Pain Management:    Induction: Intravenous  PONV Risk Score and Plan: Propofol infusion and Treatment may vary due to age or medical condition  Airway Management Planned: Natural Airway  Additional Equipment:   Intra-op Plan:   Post-operative Plan:   Informed Consent: I have reviewed the patients History and Physical, chart, labs and discussed the procedure including the risks, benefits and alternatives for the proposed anesthesia with the patient or authorized representative who has indicated his/her understanding and acceptance.     Dental advisory given  Plan Discussed with: CRNA  Anesthesia Plan Comments:         Anesthesia Quick Evaluation

## 2021-04-04 NOTE — Progress Notes (Addendum)
    Progress Note   Subjective  Chief Complaint: FOBT positive anemia  Patient was given a MiraLAX bowel prep overnight and has had a large quantity of stool and feels emptied out.  He does not think there was any blood in his stool.  Denies abdominal pain.  Is aware of plans for colonoscopy during this admission.   Objective   Vital signs in last 24 hours: Temp:  [97.8 F (36.6 C)-98.8 F (37.1 C)] 98.6 F (37 C) (10/12 0734) Pulse Rate:  [59-63] 63 (10/12 0710) Resp:  [12-20] 13 (10/12 0710) BP: (112-143)/(46-60) 140/60 (10/12 0710) SpO2:  [92 %-100 %] 93 % (10/12 0710) Weight:  [77.5 kg] 77.5 kg (10/12 0710) Last BM Date: 04/03/21 General:    AA male in NAD Heart:  Regular rate and rhythm; no murmurs Lungs: Respirations even and unlabored, lungs CTA bilaterally Abdomen:  Soft, nontender and nondistended. Normal bowel sounds. Extremities:  Without edema. Psych:  Cooperative. Normal mood and affect.  Intake/Output from previous day: 10/11 0701 - 10/12 0700 In: 1153 [P.O.:960; IV Piggyback:193] Out: 2150 [Urine:2150] Intake/Output this shift: Total I/O In: 240 [P.O.:240] Out: -   Lab Results: Recent Labs    04/02/21 1428 04/03/21 0148 04/04/21 0311  WBC 6.1 6.6 5.0  HGB 7.7* 7.2* 7.6*  HCT 26.2* 24.3* 25.5*  PLT 261 224 215   BMET Recent Labs    04/02/21 0229 04/03/21 0148 04/04/21 0311  NA 142 142 138  K 5.1 5.4* 5.1  CL 113* 110 104  CO2 22 27 28   GLUCOSE 187* 98 128*  BUN 56* 66* 76*  CREATININE 2.38* 2.88* 3.10*  CALCIUM 8.5* 8.6* 8.6*     Assessment / Plan:   Assessment: 1.  Normocytic anemia: Likely multifactorial, 03/03/2021 EGD with minor gastritis and biopsies positive for H. pylori as well as a benign appearing submucosal gastric lesion, likely lipoma, positive for H. pylori very, hemoglobin stable overnight, started on quadruple therapy yesterday for H. pylori 2.  Chronic, fairly severe constipation: Taking Colace at home but not Senokot or  MiraLAX 3.  Minor bleeding per rectum: Attributed to anal fissure by GI last month, continues to have mild bleeding, none overnight 4.  CHF without volume overload 5.  CKD stage IV 6.  Hyperkalemia: Improved  Plan: 1.  We will try for colonoscopy tomorrow as long as he is able to adequately prep.  Ordered movi prep to be started this afternoon in split dose fashion. 2.  Patient  will be scheduled for colonoscopy with Dr. Lorenso Courier tomorrow.  Did discuss risks, benefits, limitations and alternatives and patient agrees to proceed. 3.  Continue other supportive measures 4. Clear liquid diet today and NPO at midnight 5. Continue to monitor hgb with transfusion as needed <7  Thank you for your kind consultation, we will continue to follow.   LOS: 3 days   Levin Erp  04/04/2021, 10:50 AM

## 2021-04-04 NOTE — Progress Notes (Signed)
PROGRESS NOTE    Robert Chaney  OIB:[id number] DOB: [birth year] DOA: 04/01/2021 PCP: [name], MD   Chief Complaint  Patient presents with   Shortness of Breath    Brief Narrative:  70 year old gentleman prior history of stage IV CKD, diabetes, chronic diastolic heart failure, chronic hypoxic respiratory failure on 4 L of nasal cannula oxygen at home, hypertension and hyperlipidemia, recently diagnosed chronic gastritis from an EGD on 03/03/2021 presents to ED with sudden onset of chest pain and shortness of breath requiring BiPAP and currently is on 8 L of nasal cannula oxygen.  Patient was started on nitroglycerin drip and was weaned off . Cardiology consulted, suggested better blood pressure control and wean nitroglycerin.    Assessment & Plan:   Principal Problem:   Acute on chronic diastolic CHF (congestive heart failure) (HCC) Active Problems:   DM (diabetes mellitus) with complications (HCC)   Stage 4 chronic kidney disease (HCC)   Hyperkalemia   Dyslipidemia   Hypertensive crisis   Multifocal pneumonia   Malnutrition of moderate degree   Acute on chronic respiratory failure with hypoxia probably secondary to a combination of acute on chronic diastolic heart failure and hypertensive urgency and healthcare associated pneumonia Patient's chest pain has resolved. His shortness of breath has improved. He initially required BiPAP with improvement in his breathing and currently is on 4-5 L of nasal cannula oxygen to keep sats greater than 90%. Chest x-ray shows Slight worsening consolidation in the right upper lobe, compatible with pneumonia. There is also evidence to suggest developing multilobar bilateral bronchopneumonia. He was started on IV vancomycin and cefepime.  MRSA PCR is negative and vancomycin discontinued. Negative sputum cultures.  Urine for strep pneumonia and urinary antigen. Respiratory panel by RT-PCR is negative for flu and COVID-19 Patient remains  afebrile and WBC count within normal limits. Procalcitonin level 0.26.  Complete antibiotic therapy.  acute on chronic diastolic heart failure Patient is currently on IV Lasix 80 mg twice daily Continue with strict intake and output and daily weight Diuresed very well. IV Lasix was discontinued and given diuretics holiday today.  Anemia of chronic disease/?  Anemia of blood loss Baseline hemoglobin around 8, hemoglobin dropped from 8.2-6.4 10/11.  S/p 1 unit of PRBC transfusion and hemoglobin has improved to 7.6. Stool for occult blood is positive. Patient has been evaluated by GI with an EGD on last admission and was found to have chronic gastritis.  Patient will need colonoscopy for further evaluation . GI following.  Scheduled for colonoscopy tomorrow.   Acute on Stage IV CKD Creatinine at 2.3 on admission , increased to 3.1 with IV lasix.  Stopped IV lasix.  Diuretics holiday. Follow-up closely.  Hyperkalemia Treated with Lokelma.  Potassium 5.1 today.  Stable.  Elevated troponins felt to be from demand ischemia in the setting of hypertensive crisis and worsening anemia. IV heparin is discontinued.  Hypertensive urgency Patient was initially started on nitroglycerin gtt and has been weaned off. Blood pressure parameters have improved Patient currently on hydralazine 100 mg 3 times daily, Coreg 25 mg twice daily, amlodipine 10 mg daily.  Hyperlipidemia Continue with Lipitor 20 mg daily.  Chronic constipation Patient is on Fleet Enema, Colace and senna now. Miralax added by GI.  Good response to laxatives.   Diabetes mellitus with hyperglycemia Non insulin dependent.  CBG (last 3)  Recent Labs    04/03/21 2141 04/04/21 0654 04/04/21 1050  GLUCAP 191* 101* 188*   Decreased the semglee to 6 units  daily.  Continue with SSI.   Body mass index is 29.33 kg/m. Recommend outpatient follow up with PCP.     Moderate malnutrition: Dietary on board.    DVT  prophylaxis: SCDs Code Status: DNR Family Communication: None at bedside  disposition: Remains inpatient.  Status is: Inpatient  Remains inpatient appropriate because:Ongoing diagnostic testing needed not appropriate for outpatient work up, Unsafe d/c plan, and IV treatments appropriate due to intensity of illness or inability to take PO  Dispo: The patient is from: Home              Anticipated d/c is to: Home              Patient currently is not medically stable to d/c.   Difficult to place patient No       Consultants:  Cardiology Gastroenterology  Procedures: None Antimicrobials: Cefepime  Subjective: Seen and examined.  Denies any complaints.  He thinks after having good bowel movement his symptoms improved and breathing improved.  Objective: Vitals:   04/04/21 0244 04/04/21 0710 04/04/21 0734 04/04/21 1053  BP: (!) 112/46 140/60  112/67  Pulse: 62 63  61  Resp: _0 Temp:  98.3 F (36.8 C) 98.6 F (37 C) (!) 97.4 F (36.3 C)  TempSrc:  Oral Oral Oral  SpO2: 95% 93%  100%  Weight:  77.5 kg    Height:        Intake/Output Summary (Last 24 hours) at 04/04/2021 1456 Last data filed at 04/04/2021 1258 Gross per 24 hour  Intake 1793.02 ml  Output 1900 ml  Net -106.98 ml   Filed Weights   04/02/21 1700 04/03/21 0400 04/04/21 0710  Weight: 112 kg 76.6 kg 77.5 kg    Examination:  General: Frail debilitated and chronically sick looking but not in any distress. Looks fairly comfortable. SpO2: 100 % O2 Flow Rate (L/min): 6 L/min FiO2 (%): 40 %  Cardiovascula S1-S2 normal.  No added sounds.    Respiratory: Bilateral clear.  Currently on 6 L of oxygen. Gastrointestinal: Soft and nontender.  Bowel sounds present. Ext: No swelling or edema.  No cyanosis.    Data Reviewed: I have personally reviewed following labs and imaging studies  CBC: Recent Labs  Lab 04/01/21 0615 04/01/21 0630 04/02/21 0036 04/02/21 0229 04/02/21 1428 04/03/21 0148  04/04/21 0311  WBC 9.3  --   --  8.0 6.1 6.6 5.0  NEUTROABS 7.3  --   --   --  4.8  --   --   HGB 7.7*   < > 7.8* 6.4* 7.7* 7.2* 7.6*  HCT 26.5*   < > 23.0* 24.1* 26.2* 24.3* 25.5*  MCV 98.9  --   --  103.9* 96.7 94.6 94.4  PLT 317  --   --  260 261 224 215   < > = values in this interval not displayed.    Basic Metabolic Panel: Recent Labs  Lab 04/01/21 0615 04/01/21 0630 04/01/21 1206 04/02/21 0008 04/02/21 0036 04/02/21 0229 04/03/21 0148 04/04/21 0311  NA 142   < > 144  --  146* 142 142 138  K 6.0*   < > 5.7* 5.1 4.9 5.1 5.4* 5.1  CL 110  --  114*  --   --  113* 110 104  CO2 26  --  25  --   --  _1 GLUCOSE 252*  --  164*  --   --  187* 98 128*  BUN 64*  --  60*  --   --  56* 66* 76*  CREATININE 2.83*  --  2.55*  --   --  2.38* 2.88* 3.10*  CALCIUM 9.1  --  9.0  --   --  8.5* 8.6* 8.6*   < > = values in this interval not displayed.    GFR: Estimated Creatinine Clearance: 20.3 mL/min (A) (by C-G formula based on SCr of 3.1 mg/dL (H)).  Liver Function Tests: No results for input(s): AST, ALT, ALKPHOS, BILITOT, PROT, ALBUMIN in the last 168 hours.  CBG: Recent Labs  Lab 04/03/21 1114 04/03/21 1628 04/03/21 2141 04/04/21 0654 04/04/21 1050  GLUCAP 163* 133* 191* 101* 188*     Recent Results (from the past 240 hour(s))  Resp Panel by RT-PCR (Flu A&B, Covid) Nasopharyngeal Swab     Status: None   Collection Time: 04/01/21  6:13 AM   Specimen: Nasopharyngeal Swab; Nasopharyngeal(NP) swabs in vial transport medium  Result Value Ref Range Status   SARS Coronavirus 2 by RT PCR NEGATIVE NEGATIVE Final    Comment: (NOTE) SARS-CoV-2 target nucleic acids are NOT DETECTED.  The SARS-CoV-2 RNA is generally detectable in upper respiratory specimens during the acute phase of infection. The lowest concentration of SARS-CoV-2 viral copies this assay can detect is 138 copies/mL. A negative result does not preclude SARS-Cov-2 infection and should not be used as the  sole basis for treatment or other patient management decisions. A negative result may occur with  improper specimen collection/handling, submission of specimen other than nasopharyngeal swab, presence of viral mutation(s) within the areas targeted by this assay, and inadequate number of viral copies(<138 copies/mL). A negative result must be combined with clinical observations, patient history, and epidemiological information. The expected result is Negative.  Fact Sheet for Patients:  EntrepreneurPulse.com.au  Fact Sheet for Healthcare Providers:  IncredibleEmployment.be  This test is no t yet approved or cleared by the [place] FDA and  has been authorized for detection and/or diagnosis of SARS-CoV-2 by FDA under an Emergency Use Authorization (EUA). This EUA will remain  in effect (meaning this test can be used) for the duration of the COVID-19 declaration under Section 564(b)(1) of the Act, 21 U.S.C.section 360bbb-3(b)(1), unless the authorization is terminated  or revoked sooner.       Influenza A by PCR NEGATIVE NEGATIVE Final   Influenza B by PCR NEGATIVE NEGATIVE Final    Comment: (NOTE) The Xpert Xpress SARS-CoV-2/FLU/RSV plus assay is intended as an aid in the diagnosis of influenza from Nasopharyngeal swab specimens and should not be used as a sole basis for treatment. Nasal washings and aspirates are unacceptable for Xpert Xpress SARS-CoV-2/FLU/RSV testing.  Fact Sheet for Patients: EntrepreneurPulse.com.au  Fact Sheet for Healthcare Providers: IncredibleEmployment.be  This test is not yet approved or cleared by the [place] FDA and has been authorized for detection and/or diagnosis of SARS-CoV-2 by FDA under an Emergency Use Authorization (EUA). This EUA will remain in effect (meaning this test can be used) for the duration of the COVID-19 declaration under Section 564(b)(1) of the  Act, 21 U.S.C. section 360bbb-3(b)(1), unless the authorization is terminated or revoked.  Performed at [facility], [place] [address] [place], [place] [address]   Culture, blood (Routine X 2) w Reflex to ID Panel     Status: None (Preliminary result)   Collection Time: 04/01/21 12:05 PM   Specimen: BLOOD RIGHT HAND  Result Value Ref Range Status   Specimen Description  BLOOD RIGHT HAND  Final   Special Requests   Final    BOTTLES DRAWN AEROBIC AND ANAEROBIC Blood Culture results may not be optimal due to an inadequate volume of blood received in culture bottles   Culture   Final    NO GROWTH 3 DAYS Performed at [facility], [place] [address], [place], [place] [address]    Report Status PENDING  Incomplete  Culture, blood (Routine X 2) w Reflex to ID Panel     Status: None (Preliminary result)   Collection Time: 04/01/21 12:06 PM   Specimen: BLOOD RIGHT HAND  Result Value Ref Range Status   Specimen Description BLOOD RIGHT HAND  Final   Special Requests   Final    BOTTLES DRAWN AEROBIC ONLY Blood Culture results may not be optimal due to an inadequate volume of blood received in culture bottles   Culture   Final    NO GROWTH 3 DAYS Performed at [facility], [place] [address], [place], [place] [address]    Report Status PENDING  Incomplete  MRSA Next Gen by PCR, Nasal     Status: None   Collection Time: 04/01/21 12:49 PM   Specimen: Nasal Mucosa; Nasal Swab  Result Value Ref Range Status   MRSA by PCR Next Gen NOT DETECTED NOT DETECTED Final    Comment: (NOTE) The GeneXpert MRSA Assay (FDA approved for NASAL specimens only), is one component of a comprehensive MRSA colonization surveillance program. It is not intended to diagnose MRSA infection nor to guide or monitor treatment for MRSA infections. Test performance is not FDA approved in patients less than 12 years old. Performed at [facility], [place] [address], [place],  [address]           Radiology Studies: No results found.      Scheduled Meds:  amLODipine  10 mg Oral Daily   atorvastatin  20 mg Oral Daily   bismuth subsalicylate  751 mg Oral TID AC & HS   brimonidine  1 drop Right Eye BID   carvedilol  25 mg Oral BID   docusate sodium  200 mg Oral BID   dorzolamide  1 drop Both Eyes BID   hydrALAZINE  100 mg Oral TID   insulin aspart  0-9 Units Subcutaneous TID WC   insulin glargine-yfgn  6 Units Subcutaneous Daily   isosorbide mononitrate  60 mg Oral Daily   latanoprost  1 drop Left Eye QHS   metroNIDAZOLE  500 mg Oral TID   multivitamin  1 tablet Oral QHS   pantoprazole  40 mg Oral BID   peg 3350 powder  0.5 kit Oral Once   And   peg 3350 powder  0.5 kit Oral Once   Ensure Max Protein  11 oz Oral Daily   sodium chloride flush  3 mL Intravenous Q12H   sodium zirconium cyclosilicate  10 g Oral BID   tetracycline  500 mg Oral BID AC   Continuous Infusions:  ceFEPime (MAXIPIME) IV 2 g (04/04/21 0953)     LOS: 3 days    Total time spent: 30 minutes    Barb Merino, MD [facility]    04/04/2021, 2:56 PM

## 2021-04-04 NOTE — Progress Notes (Signed)
   04/04/21 1437  Mobility  Activity Refused mobility (Pt declined; was asleep on entry)

## 2021-04-04 NOTE — Progress Notes (Signed)
PT Cancellation Note  Patient Details Name: Robert Chaney MRN: [id number] DOB: 1951-04-05   Cancelled Treatment:    Reason Eval/Treat Not Completed: Other (comment). Tired and asked to wait, will retry tomorrow.     Ramond Dial 04/04/2021, 3:33 PM  Mee Hives, PT PhD Acute Rehab Dept. Number: [place] [facility] [place]

## 2021-04-04 NOTE — Plan of Care (Signed)

## 2021-04-05 ENCOUNTER — Telehealth: Payer: Self-pay

## 2021-04-05 DIAGNOSIS — I5033 Acute on chronic diastolic (congestive) heart failure: Secondary | ICD-10-CM | POA: Diagnosis not present

## 2021-04-05 DIAGNOSIS — D649 Anemia, unspecified: Secondary | ICD-10-CM

## 2021-04-05 DIAGNOSIS — J9621 Acute and chronic respiratory failure with hypoxia: Secondary | ICD-10-CM

## 2021-04-05 DIAGNOSIS — E118 Type 2 diabetes mellitus with unspecified complications: Secondary | ICD-10-CM | POA: Diagnosis not present

## 2021-04-05 LAB — CBC
HCT: 24.5 % — ABNORMAL LOW (ref 39.0–52.0)
Hemoglobin: 7.4 g/dL — ABNORMAL LOW (ref 13.0–17.0)
MCH: 28.5 pg (ref 26.0–34.0)
MCHC: 30.2 g/dL (ref 30.0–36.0)
MCV: 94.2 fL (ref 80.0–100.0)
Platelets: 211 10*3/uL (ref 150–400)
RBC: 2.6 MIL/uL — ABNORMAL LOW (ref 4.22–5.81)
RDW: 15.9 % — ABNORMAL HIGH (ref 11.5–15.5)
WBC: 3.8 10*3/uL — ABNORMAL LOW (ref 4.0–10.5)
nRBC: 0 % (ref 0.0–0.2)

## 2021-04-05 LAB — BASIC METABOLIC PANEL
Anion gap: 8 (ref 5–15)
BUN: 64 mg/dL — ABNORMAL HIGH (ref 8–23)
CO2: 25 mmol/L (ref 22–32)
Calcium: 8.5 mg/dL — ABNORMAL LOW (ref 8.9–10.3)
Chloride: 110 mmol/L (ref 98–111)
Creatinine, Ser: 2.76 mg/dL — ABNORMAL HIGH (ref 0.61–1.24)
GFR, Estimated: 24 mL/min — ABNORMAL LOW (ref >60)
Glucose, Bld: 147 mg/dL — ABNORMAL HIGH (ref 70–99)
Potassium: 4.1 mmol/L (ref 3.5–5.1)
Sodium: 143 mmol/L (ref 135–145)

## 2021-04-05 LAB — GLUCOSE, CAPILLARY
Glucose-Capillary: 119 mg/dL — ABNORMAL HIGH (ref 70–99)
Glucose-Capillary: 143 mg/dL — ABNORMAL HIGH (ref 70–99)
Glucose-Capillary: 157 mg/dL — ABNORMAL HIGH (ref 70–99)
Glucose-Capillary: 82 mg/dL (ref 70–99)

## 2021-04-05 MED ORDER — PEG-KCL-NACL-NASULF-NA ASC-C 100 G PO SOLR: 1.0000 | Freq: Once | ORAL | Status: DC

## 2021-04-05 MED ORDER — PEG-KCL-NACL-NASULF-NA ASC-C 100 G PO SOLR
0.5000 | Freq: Once | ORAL | Status: AC
  Administered 2021-04-06: 100 g via ORAL
  Filled 2021-04-05: qty 1

## 2021-04-05 MED ORDER — PEG-KCL-NACL-NASULF-NA ASC-C 100 G PO SOLR
0.5000 | Freq: Once | ORAL | Status: AC
  Administered 2021-04-05: 100 g via ORAL
  Filled 2021-04-05: qty 1

## 2021-04-05 MED ORDER — SODIUM CHLORIDE 0.9 % IV SOLN: INTRAVENOUS | Status: DC

## 2021-04-05 NOTE — H&P (View-Only) (Signed)
    Progress Note   Subjective  Chief Complaint: FOBT positive anemia  -Drank entire Moviprep but still having dark brown stools.  -Hb stable at 7.4 from 7.6   Objective   Vital signs in last 24 hours: Temp:  [97.4 F (36.3 C)-98.6 F (37 C)] 98.5 F (36.9 C) (10/13 0637) Pulse Rate:  [60-72] 72 (10/13 0831) Resp:  [14-18] 16 (10/13 0800) BP: (112-168)/(51-79) 155/58 (10/13 0800) SpO2:  [93 %-100 %] 100 % (10/13 0831) Weight:  [71.4 kg] 71.4 kg (10/13 0637) Last BM Date: 04/04/21 General:    AA male in NAD Heart:  Regular rate Lungs: Breathing normally on [place] oxygen Abdomen:  Nondistended Extremities:  Without edema. Psych:  Cooperative. Normal mood and affect.  Intake/Output from previous day: 10/12 0701 - 10/13 0700 In: 2123 [P.O.:2020; I.V.:3; IV Piggyback:100] Out: 975 [Urine:975] Intake/Output this shift: No intake/output data recorded.  Lab Results: Recent Labs    04/03/21 0148 04/04/21 0311 04/05/21 0201  WBC 6.6 5.0 3.8*  HGB 7.2* 7.6* 7.4*  HCT 24.3* 25.5* 24.5*  PLT 224 215 211   BMET Recent Labs    04/03/21 0148 04/04/21 0311 04/05/21 0201  NA 142 138 143  K 5.4* 5.1 4.1  CL 110 104 110  CO2 27 28 25   GLUCOSE 98 128* 147*  BUN 66* 76* 64*  CREATININE 2.88* 3.10* 2.76*  CALCIUM 8.6* 8.6* 8.5*     Assessment / Plan:   Assessment: 1.  Normocytic anemia: Likely multifactorial, 03/03/2021 EGD with minor gastritis and biopsies positive for H. pylori as well as a benign appearing submucosal gastric lesion, likely lipoma, positive for H. pylori very, hemoglobin stable overnight, started on quadruple therapy for H. pylori 2.  Chronic, fairly severe constipation: Taking Colace at home but not Senokot or MiraLAX 3.  Minor bleeding per rectum: Attributed to anal fissure by GI last month, continues to have mild bleeding, none overnight 4.  CHF without volume overload 5.  CKD stage IV 6.  Hyperkalemia: Improved  Plan: 1.  Will have him do another  prep this evening and plan for colonoscopy tomorrow 2. Clear liquid diet today and NPO at midnight 3. Continue to monitor hgb with transfusion as needed <7 4. Continue H pylori therapy 5. Will need GI follow up upon discharge to ensure H pylori eradication, which has been requested  Thank you for your kind consultation, we will continue to follow.   LOS: 4 days   [name]  04/05/2021, 9:20 AM

## 2021-04-05 NOTE — Progress Notes (Signed)
   04/05/21 1219  Mobility  Activity Refused mobility    Patient was asleep upon entry. Requested to check back later

## 2021-04-05 NOTE — Care Management Important Message (Signed)
Important Message  Patient Details  Name: Robert Chaney MRN: [id number] Date of Birth: January 19, 1951   Medicare Important Message Given:  Yes     [name] 04/05/2021, 9:19 AM

## 2021-04-05 NOTE — Progress Notes (Signed)
Physical Therapy Treatment Patient Details Name: Robert Chaney MRN: [id number] DOB: 1950/08/02 Today's Date: 04/05/2021   History of Present Illness Pt is a 70 y/o male who presented with SOB and chest pain.Pt required BiPAP and nitroglycerin drip, admitted for further workup. Pt recently admitted 9/23-9/29 due to acute on chronic respiratory failure w/ CHF and hypertensive urgency. PMH: CHF, DM, CKD, HTN, PNA.    PT Comments    Pt reports he normally does not use an AD inside his home. Thus, focused session on progressing pt's gait independence away from using the RW along with weaning his O2. Pt was able to ambulate without UE support and with min guard assist, but displayed significant unsteadiness with near LOB bouts that place him at risk for falls. Pt limited in gait balance by R lower extremity weakness and coordination deficits. Pt's sats tend to decrease from 89% to 81% on 4L after the initial ~100 ft of gait. Will continue to follow acutely. Current recommendations remain appropriate.    Recommendations for follow up therapy are one component of a multi-disciplinary discharge planning process, led by the attending physician.  Recommendations may be updated based on patient status, additional functional criteria and insurance authorization.  Follow Up Recommendations  No PT follow up;Supervision - Intermittent     Equipment Recommendations  None recommended by PT    Recommendations for Other Services       Precautions / Restrictions Precautions Precautions: Fall Precaution Comments: monitor O2, wears 4L O2 baseline Restrictions Weight Bearing Restrictions: No     Mobility  Bed Mobility Overal bed mobility: Modified Independent             General bed mobility comments: Pt performs all bed mobility aspects without assistance.    Transfers Overall transfer level: Needs assistance Equipment used: Rolling walker (2 wheeled) Transfers: Sit to/from Stand Sit to  Stand: Supervision         General transfer comment: Supervision for safety, no LOB.  Ambulation/Gait Ambulation/Gait assistance: Min guard Gait Distance (Feet): 480 Feet Assistive device: Rolling walker (2 wheeled);None Gait Pattern/deviations: Step-through pattern;Decreased step length - right;Decreased dorsiflexion - right;Narrow base of support Gait velocity: Decreased Gait velocity interpretation: 1.31 - 2.62 ft/sec, indicative of limited community ambulator General Gait Details: Initial half of gait distance pt using RW with no LOB. Pt has to slow his gait or even stop with visual dual tasking, cuing pt to change head positions to look for objects of various colors when walking. Final half of gait bout pt ambulated without UE support, displaying narrow R foot placement and decreased R foot clearance and step length. Pt reports hx of R leg deficits. Cued pt to widen stance with improved stability but pt still with moments of near LOB, min guard for safety.   Stairs             Wheelchair Mobility    Modified Rankin (Stroke Patients Only)       Balance Overall balance assessment: Needs assistance Sitting-balance support: No upper extremity supported;Feet supported Sitting balance-Leahy Scale: Good     Standing balance support: No upper extremity supported;Bilateral upper extremity supported;During functional activity Standing balance-Leahy Scale: Fair Standing balance comment: Pt with near LOB bouts when ambulating without UE support, improved stability with use of RW.                            Cognition Arousal/Alertness: Awake/alert Behavior During Therapy: WFL for  tasks assessed/performed Overall Cognitive Status: Within Functional Limits for tasks assessed                                 General Comments: cooperative and appreciative. pt with possible language barrier impacting his comprehension of his medical treatment, needing  repeated basic descriptions to explain the planned colonoscopy      Exercises      General Comments General comments (skin integrity, edema, etc.): SpO2 as low as 81% on 4L while ambulating, cued pt for pursed lip breathing with steady recovery. Pt with tendency to maintain sats at 89% during initial ~100 ft of gait then sate begin to decrease further as distance progresses.      Pertinent Vitals/Pain Pain Assessment: No/denies pain    Home Living                      Prior Function            PT Goals (current goals can now be found in the care plan section) Acute Rehab PT Goals Patient Stated Goal: return home PT Goal Formulation: With patient Time For Goal Achievement: 04/17/21 Potential to Achieve Goals: Good Progress towards PT goals: Progressing toward goals    Frequency    Min 3X/week      PT Plan Current plan remains appropriate    Co-evaluation              AM-PAC PT "6 Clicks" Mobility   Outcome Measure  Help needed turning from your back to your side while in a flat bed without using bedrails?: None Help needed moving from lying on your back to sitting on the side of a flat bed without using bedrails?: None Help needed moving to and from a bed to a chair (including a wheelchair)?: A Little Help needed standing up from a chair using your arms (e.g., wheelchair or bedside chair)?: A Little Help needed to walk in hospital room?: A Little Help needed climbing 3-5 steps with a railing? : A Little 6 Click Score: 20    End of Session Equipment Utilized During Treatment: Gait belt;Oxygen Activity Tolerance: Patient tolerated treatment well Patient left: in bed;with call bell/phone within reach Nurse Communication: Mobility status PT Visit Diagnosis: Other abnormalities of gait and mobility (R26.89);Muscle weakness (generalized) (M62.81);Unsteadiness on feet (R26.81);Difficulty in walking, not elsewhere classified (R26.2)     Time:  1224-8250 PT Time Calculation (min) (ACUTE ONLY): 24 min  Charges:  $Gait Training: 23-37 mins                     Moishe Spice, PT, DPT Acute Rehabilitation Services  Pager: [phone] Office: [place] 04/05/2021, 5:20 PM

## 2021-04-05 NOTE — Telephone Encounter (Signed)
-----   Message from [name], MD sent at 04/05/2021  9:29 AM EDT ----- Hi Mavis Fichera, please schedule follow up with me in 2 months to check H pylori eradication.  Thanks, Lyndee Leo

## 2021-04-05 NOTE — Progress Notes (Signed)
Patient has become increasingly agitated over night, demanding to see his doctor "now" and refusing to elaborate on concerns and complaints to floor staff. On call GI and hospitalist physicians paged; no response received. Patient refused to sign consent for 0830 colonoscopy.

## 2021-04-05 NOTE — Progress Notes (Signed)
Occupational Therapy Treatment Patient Details Name: Robert Chaney MRN: [id number] DOB: 1951-03-12 Today's Date: 04/05/2021   History of present illness Pt is a 70 y/o male who presented with SOB and chest pain.Pt required BiPAP and nitroglycerin drip, admitted for further workup. Pt recently admitted 9/23-9/29 due to acute on chronic respiratory failure w/ CHF and hypertensive urgency. PMH: CHF, DM, CKD, HTN, PNA.   OT comments  Pt able to tolerate 4 L O2 with bathroom mobility and ADLs standing at sink, denies SOB with SpO2 91%. Pt may require increased O2 for hallway mobility or extended standing activities. Pt requires Supervision at most for in-room mobility without AD, Setup for LB ADLs. Time spent reinforcing energy conservation strategies with provision of basic tips written out for pt to follow during daily activities and answering pt questions regarding O2 use, positioning while sleeping and general strategies to prevent CHF exacerbation.    Recommendations for follow up therapy are one component of a multi-disciplinary discharge planning process, led by the attending physician.  Recommendations may be updated based on patient status, additional functional criteria and insurance authorization.    Follow Up Recommendations  No OT follow up    Equipment Recommendations  None recommended by OT    Recommendations for Other Services      Precautions / Restrictions Precautions Precautions: Fall Precaution Comments: monitor O2, wears 4L O2 baseline Restrictions Weight Bearing Restrictions: No       Mobility Bed Mobility Overal bed mobility: Independent                  Transfers Overall transfer level: Needs assistance Equipment used: None Transfers: Sit to/from Stand Sit to Stand: Supervision         General transfer comment: no assist, steady    Balance Overall balance assessment: Needs assistance Sitting-balance support: No upper extremity supported;Feet  supported Sitting balance-Leahy Scale: Good     Standing balance support: No upper extremity supported;During functional activity Standing balance-Leahy Scale: Good Standing balance comment: able to mobilize short distances without AD in room without LOB                           ADL either performed or assessed with clinical judgement   ADL Overall ADL's : Needs assistance/impaired     Grooming: Independent;Standing;Wash/dry face;Oral care;Wash/dry hands Grooming Details (indicate cue type and reason): assist only for lines, no LOB or safety concerns     Lower Body Bathing: Set up;Sit to/from stand Lower Body Bathing Details (indicate cue type and reason): to bathe peri region Upper Body Dressing : Set up;Sitting   Lower Body Dressing: Set up;Sit to/from stand Lower Body Dressing Details (indicate cue type and reason): to don clean socks EOB Toilet Transfer: Supervision/safety;Ambulation Toilet Transfer Details (indicate cue type and reason): no AD to regular toilet Toileting- Clothing Manipulation and Hygiene: Set up;Sit to/from stand Toileting - Clothing Manipulation Details (indicate cue type and reason): able to perform peri care without assist       General ADL Comments: Pt with continued limitations due to cardiopulmonary tolerance and carryover of health education. Wrote simple tips in large print with pt able to read aloud to encourage energy conservation (O2 >90%, take breaks when short of breath and deep breaths in nose and out mouth). Asked pt about fluid restrictions, edu on heart and lung involvement, pt also reports inability to lay flat in bed     Vision   Vision  Assessment?: No apparent visual deficits   Perception     Praxis      Cognition Arousal/Alertness: Awake/alert Behavior During Therapy: Flat affect Overall Cognitive Status: Within Functional Limits for tasks assessed                                 General Comments:  cooperative and appreciative. question if pt with some language barrier limiting health education comprehension, especially O2 needs        Exercises     Shoulder Instructions       General Comments SpO2 96% on 6 L O2, trialed on 4 L O2 with bathroom/ADLs at sink with O2 91%. denies SOB    Pertinent Vitals/ Pain       Pain Assessment: No/denies pain  Home Living                                          Prior Functioning/Environment              Frequency  Min 2X/week        Progress Toward Goals  OT Goals(current goals can now be found in the care plan section)  Progress towards OT goals: Progressing toward goals  Acute Rehab OT Goals Patient Stated Goal: return home OT Goal Formulation: With patient Time For Goal Achievement: 04/17/21 Potential to Achieve Goals: Good ADL Goals Pt Will Perform Lower Body Bathing: Independently;sit to/from stand Pt Will Transfer to Toilet: Independently;ambulating Pt Will Perform Tub/Shower Transfer: Tub transfer;Independently;ambulating Additional ADL Goal #1: Pt to demonstrate ability to implement pursed lip breathing independently during functional tasks to maintain SpO2 >90% Additional ADL Goal #2: Pt to verbalize at least 3 energy conservation strategies to implement during ADLs/IADLs  Plan Discharge plan remains appropriate    Co-evaluation                 AM-PAC OT "6 Clicks" Daily Activity     Outcome Measure   Help from another person eating meals?: None Help from another person taking care of personal grooming?: None Help from another person toileting, which includes using toliet, bedpan, or urinal?: A Little Help from another person bathing (including washing, rinsing, drying)?: A Little Help from another person to put on and taking off regular upper body clothing?: A Little Help from another person to put on and taking off regular lower body clothing?: A Little 6 Click Score: 20     End of Session Equipment Utilized During Treatment: Oxygen  OT Visit Diagnosis: Other abnormalities of gait and mobility (R26.89)   Activity Tolerance Patient tolerated treatment well   Patient Left in bed;with call bell/phone within reach   Nurse Communication Mobility status;Other (comment) (O2, health literacy)        Time: 8299-3716 OT Time Calculation (min): 36 min  Charges: OT General Charges $OT Visit: 1 Visit OT Treatments $Self Care/Home Management : 8-22 mins $Therapeutic Activity: 8-22 mins  Malachy Chamber, OTR/L Acute Rehab Services Office: [phone]   Robert Chaney 04/05/2021, 2:50 PM

## 2021-04-05 NOTE — Telephone Encounter (Signed)
Per Dr. Libby Maw request, pt has been scheduled for hosp f/u H pylori eradication on 06/07/21 @ 130pm. Appt reminder has been mailed. Appt will reflect on AVS upon hosp discharge for pt future reference.

## 2021-04-05 NOTE — Progress Notes (Signed)
PROGRESS NOTE    Robert Chaney  DGL:[id number] DOB: 1951-04-20 DOA: 04/01/2021 PCP: [name], MD   Chief Complaint  Patient presents with   Shortness of Breath    Brief Narrative:  70 year old gentleman prior history of stage IV CKD, diabetes, chronic diastolic heart failure, chronic hypoxic respiratory failure on 4 L of nasal cannula oxygen at home, hypertension and hyperlipidemia, recently diagnosed chronic gastritis from an EGD on 03/03/2021 presents to ED with sudden onset of chest pain and shortness of breath requiring BiPAP and currently is on 8 L of nasal cannula oxygen.  Patient was started on nitroglycerin drip and was weaned off . Cardiology consulted, suggested better blood pressure control and wean nitroglycerin.    Assessment & Plan:   Principal Problem:   Acute on chronic diastolic CHF (congestive heart failure) (HCC) Active Problems:   DM (diabetes mellitus) with complications (HCC)   Stage 4 chronic kidney disease (HCC)   Hyperkalemia   Dyslipidemia   Hypertensive crisis   Multifocal pneumonia   Malnutrition of moderate degree   Acute on chronic respiratory failure with hypoxia probably secondary to a combination of acute on chronic diastolic heart failure and hypertensive urgency and healthcare associated pneumonia Reports that he chronically uses 4 L of oxygen at home. Patient's chest pain has resolved. His shortness of breath has improved. He initially required BiPAP with improvement in his breathing and currently is on 4-5 L of nasal cannula oxygen to keep sats greater than 90%. Chest x-ray shows Slight worsening consolidation in the right upper lobe, compatible with pneumonia. There is also evidence to suggest developing multilobar bilateral bronchopneumonia. He was started on IV vancomycin and cefepime.  MRSA PCR is negative and vancomycin discontinued. Negative sputum cultures.  Urine for strep pneumonia and urinary antigen. Respiratory panel by RT-PCR  is negative for flu and COVID-19 Patient remains afebrile and WBC count within normal limits. Procalcitonin level 0.26.  Complete antibiotic therapy.  acute on chronic diastolic heart failure Patient is currently on IV Lasix 80 mg twice daily Continue with strict intake and output and daily weight Diuresed very well. IV Lasix was discontinued and given diuretics holiday Can likely resume oral diuretics tomorrow if creatinine remains stable  Anemia of chronic disease/?  Anemia of blood loss Baseline hemoglobin around 8, hemoglobin dropped from 8.2-6.4 10/11.  S/p 1 unit of PRBC transfusion and hemoglobin has improved to 7.6. Stool for occult blood is positive. Patient has been evaluated by GI with an EGD on last admission and was found to have chronic gastritis.  Patient will need colonoscopy for further evaluation . GI following.  Colonoscopy was planned for 10/13, but bowel prep appear to be inadequate.  Patient will undergo prep again tonight with plans for colonoscopy on 10/14   Acute on Stage IV CKD Creatinine at 2.3 on admission , increased to 3.1 with IV lasix.  Diuretics were held and overall creatinine is better today at 2.7 Will hold diuretics for the 24 hours and likely resume tomorrow on oral diuretics  Hyperkalemia Treated with Lokelma.    Elevated troponins felt to be from demand ischemia in the setting of hypertensive crisis and worsening anemia. IV heparin is discontinued.  Hypertensive urgency Patient was initially started on nitroglycerin gtt and has been weaned off. Blood pressure parameters have improved Patient currently on hydralazine 100 mg 3 times daily, Coreg 25 mg twice daily, amlodipine 10 mg daily.  Hyperlipidemia Continue with Lipitor 20 mg daily.  Chronic constipation Patient is  on Fleet Enema, Colace and senna now. Miralax added by GI.  Good response to laxatives.   Diabetes mellitus with hyperglycemia Non insulin dependent.  CBG (last 3)   Recent Labs    04/05/21 0632 04/05/21 1100 04/05/21 1625  GLUCAP 119* 143* 157*   Decreased the semglee to 6 units daily.  Continue with SSI.   Body mass index is 27.02 kg/m. Recommend outpatient follow up with PCP.     Moderate malnutrition: Dietary on board.    DVT prophylaxis: SCDs Code Status: DNR Family Communication: None at bedside  disposition: Remains inpatient.  Status is: Inpatient  Remains inpatient appropriate because:Ongoing diagnostic testing needed not appropriate for outpatient work up, Unsafe d/c plan, and IV treatments appropriate due to intensity of illness or inability to take PO  Dispo: The patient is from: Home              Anticipated d/c is to: Home              Patient currently is not medically stable to d/c.   Difficult to place patient No       Consultants:  Cardiology Gastroenterology  Procedures: None Antimicrobials: Cefepime  Subjective: Patient is seen sitting up in bed today.  About to work with physical therapy.  Feels that his breathing is better.  He is down to 4 L of oxygen.  Objective: Vitals:   04/05/21 0800 04/05/21 0831 04/05/21 1100 04/05/21 1625  BP: (!) 155/58  (!) 149/68 (!) 127/57  Pulse: 66 72 64 60  Resp: '16  16 16  ' Temp: 98.6 F (37 C)  98.2 F (36.8 C) 98 F (36.7 C)  TempSrc: Oral  Oral Oral  SpO2: 98% 100% 91% 96%  Weight:      Height:        Intake/Output Summary (Last 24 hours) at 04/05/2021 1940 Last data filed at 04/05/2021 1746 Gross per 24 hour  Intake 2443 ml  Output 345 ml  Net 2098 ml   Filed Weights   04/03/21 0400 04/04/21 0710 04/05/21 0637  Weight: 76.6 kg 77.5 kg 71.4 kg    Examination:  General exam: Alert, awake, oriented x 3 Respiratory system: Clear to auscultation. Respiratory effort normal. Cardiovascular system:RRR. No murmurs, rubs, gallops. Gastrointestinal system: Abdomen is nondistended, soft and nontender. No organomegaly or masses felt. Normal bowel sounds  heard. Central nervous system: Alert and oriented. No focal neurological deficits. Extremities: No C/C/E, +pedal pulses Skin: No rashes, lesions or ulcers Psychiatry: Judgement and insight appear normal. Mood & affect appropriate.      Data Reviewed: I have personally reviewed following labs and imaging studies  CBC: Recent Labs  Lab 04/01/21 0615 04/01/21 0630 04/02/21 0229 04/02/21 1428 04/03/21 0148 04/04/21 0311 04/05/21 0201  WBC 9.3  --  8.0 6.1 6.6 5.0 3.8*  NEUTROABS 7.3  --   --  4.8  --   --   --   HGB 7.7*   < > 6.4* 7.7* 7.2* 7.6* 7.4*  HCT 26.5*   < > 24.1* 26.2* 24.3* 25.5* 24.5*  MCV 98.9  --  103.9* 96.7 94.6 94.4 94.2  PLT 317  --  260 261 224 215 211   < > = values in this interval not displayed.    Basic Metabolic Panel: Recent Labs  Lab 04/01/21 1206 04/02/21 0008 04/02/21 0036 04/02/21 0229 04/03/21 0148 04/04/21 0311 04/05/21 0201  NA 144  --  146* 142 142 138 143  K 5.7*   < >  4.9 5.1 5.4* 5.1 4.1  CL 114*  --   --  113* 110 104 110  CO2 25  --   --  '22 27 28 25  ' GLUCOSE 164*  --   --  187* 98 128* 147*  BUN 60*  --   --  56* 66* 76* 64*  CREATININE 2.55*  --   --  2.38* 2.88* 3.10* 2.76*  CALCIUM 9.0  --   --  8.5* 8.6* 8.6* 8.5*   < > = values in this interval not displayed.    GFR: Estimated Creatinine Clearance: 21.9 mL/min (A) (by C-G formula based on SCr of 2.76 mg/dL (H)).  Liver Function Tests: No results for input(s): AST, ALT, ALKPHOS, BILITOT, PROT, ALBUMIN in the last 168 hours.  CBG: Recent Labs  Lab 04/04/21 1050 04/04/21 1613 04/05/21 0632 04/05/21 1100 04/05/21 1625  GLUCAP 188* 138* 119* 143* 157*     Recent Results (from the past 240 hour(s))  Resp Panel by RT-PCR (Flu A&B, Covid) Nasopharyngeal Swab     Status: None   Collection Time: 04/01/21  6:13 AM   Specimen: Nasopharyngeal Swab; Nasopharyngeal(NP) swabs in vial transport medium  Result Value Ref Range Status   SARS Coronavirus 2 by RT PCR  NEGATIVE NEGATIVE Final    Comment: (NOTE) SARS-CoV-2 target nucleic acids are NOT DETECTED.  The SARS-CoV-2 RNA is generally detectable in upper respiratory specimens during the acute phase of infection. The lowest concentration of SARS-CoV-2 viral copies this assay can detect is 138 copies/mL. A negative result does not preclude SARS-Cov-2 infection and should not be used as the sole basis for treatment or other patient management decisions. A negative result may occur with  improper specimen collection/handling, submission of specimen other than nasopharyngeal swab, presence of viral mutation(s) within the areas targeted by this assay, and inadequate number of viral copies(<138 copies/mL). A negative result must be combined with clinical observations, patient history, and epidemiological information. The expected result is Negative.  Fact Sheet for Patients:  EntrepreneurPulse.com.au  Fact Sheet for Healthcare Providers:  IncredibleEmployment.be  This test is no t yet approved or cleared by the [place] FDA and  has been authorized for detection and/or diagnosis of SARS-CoV-2 by FDA under an Emergency Use Authorization (EUA). This EUA will remain  in effect (meaning this test can be used) for the duration of the COVID-19 declaration under Section 564(b)(1) of the Act, 21 U.S.C.section 360bbb-3(b)(1), unless the authorization is terminated  or revoked sooner.       Influenza A by PCR NEGATIVE NEGATIVE Final   Influenza B by PCR NEGATIVE NEGATIVE Final    Comment: (NOTE) The Xpert Xpress SARS-CoV-2/FLU/RSV plus assay is intended as an aid in the diagnosis of influenza from Nasopharyngeal swab specimens and should not be used as a sole basis for treatment. Nasal washings and aspirates are unacceptable for Xpert Xpress SARS-CoV-2/FLU/RSV testing.  Fact Sheet for Patients: EntrepreneurPulse.com.au  Fact Sheet for  Healthcare Providers: IncredibleEmployment.be  This test is not yet approved or cleared by the [place] FDA and has been authorized for detection and/or diagnosis of SARS-CoV-2 by FDA under an Emergency Use Authorization (EUA). This EUA will remain in effect (meaning this test can be used) for the duration of the COVID-19 declaration under Section 564(b)(1) of the Act, 21 U.S.C. section 360bbb-3(b)(1), unless the authorization is terminated or revoked.  Performed at [facility], [place] [address], [place], [place] [address]   Culture, blood (Routine X 2)  w Reflex to ID Panel     Status: None (Preliminary result)   Collection Time: 04/01/21 12:05 PM   Specimen: BLOOD RIGHT HAND  Result Value Ref Range Status   Specimen Description BLOOD RIGHT HAND  Final   Special Requests   Final    BOTTLES DRAWN AEROBIC AND ANAEROBIC Blood Culture results may not be optimal due to an inadequate volume of blood received in culture bottles   Culture   Final    NO GROWTH 4 DAYS Performed at [facility], [place] [address], [place], [place] [address]    Report Status PENDING  Incomplete  Culture, blood (Routine X 2) w Reflex to ID Panel     Status: None (Preliminary result)   Collection Time: 04/01/21 12:06 PM   Specimen: BLOOD RIGHT HAND  Result Value Ref Range Status   Specimen Description BLOOD RIGHT HAND  Final   Special Requests   Final    BOTTLES DRAWN AEROBIC ONLY Blood Culture results may not be optimal due to an inadequate volume of blood received in culture bottles   Culture   Final    NO GROWTH 4 DAYS Performed at [facility], [place] [address], [place], [place] [address]    Report Status PENDING  Incomplete  MRSA Next Gen by PCR, Nasal     Status: None   Collection Time: 04/01/21 12:49 PM   Specimen: Nasal Mucosa; Nasal Swab  Result Value Ref Range Status   MRSA by PCR Next Gen NOT DETECTED NOT DETECTED Final    Comment: (NOTE) The GeneXpert MRSA  Assay (FDA approved for NASAL specimens only), is one component of a comprehensive MRSA colonization surveillance program. It is not intended to diagnose MRSA infection nor to guide or monitor treatment for MRSA infections. Test performance is not FDA approved in patients less than 38 years old. Performed at [facility], [place] [address] [place], [place] [address]          Radiology Studies: No results found.      Scheduled Meds:  amLODipine  10 mg Oral Daily   atorvastatin  20 mg Oral Daily   bismuth subsalicylate  088 mg Oral TID AC & HS   brimonidine  1 drop Right Eye BID   carvedilol  25 mg Oral BID   docusate sodium  200 mg Oral BID   dorzolamide  1 drop Both Eyes BID   hydrALAZINE  100 mg Oral TID   insulin aspart  0-9 Units Subcutaneous TID WC   insulin glargine-yfgn  6 Units Subcutaneous Daily   isosorbide mononitrate  60 mg Oral Daily   latanoprost  1 drop Left Eye QHS   metroNIDAZOLE  500 mg Oral TID   multivitamin  1 tablet Oral QHS   pantoprazole  40 mg Oral BID   peg 3350 powder  0.5 kit Oral Once   And   [START ON 04/06/2021] peg 3350 powder  0.5 kit Oral Once   Ensure Max Protein  11 oz Oral Daily   sodium chloride flush  3 mL Intravenous Q12H   tetracycline  500 mg Oral BID AC   Continuous Infusions:  sodium chloride 20 mL/hr at 04/05/21 1100   ceFEPime (MAXIPIME) IV 2 g (04/05/21 0815)     LOS: 4 days    Total time spent: 30 minutes    Kathie Dike, MD [facility]    04/05/2021, 7:40 PM

## 2021-04-05 NOTE — Progress Notes (Signed)
    Progress Note   Subjective  Chief Complaint: FOBT positive anemia  -Drank entire Moviprep but still having dark brown stools.  -Hb stable at 7.4 from 7.6   Objective   Vital signs in last 24 hours: Temp:  [97.4 F (36.3 C)-98.6 F (37 C)] 98.5 F (36.9 C) (10/13 0637) Pulse Rate:  [60-72] 72 (10/13 0831) Resp:  [14-18] 16 (10/13 0800) BP: (112-168)/(51-79) 155/58 (10/13 0800) SpO2:  [93 %-100 %] 100 % (10/13 0831) Weight:  [71.4 kg] 71.4 kg (10/13 0637) Last BM Date: 04/04/21 General:    AA male in NAD Heart:  Regular rate Lungs: Breathing normally on [place] oxygen Abdomen:  Nondistended Extremities:  Without edema. Psych:  Cooperative. Normal mood and affect.  Intake/Output from previous day: 10/12 0701 - 10/13 0700 In: 2123 [P.O.:2020; I.V.:3; IV Piggyback:100] Out: 975 [Urine:975] Intake/Output this shift: No intake/output data recorded.  Lab Results: Recent Labs    04/03/21 0148 04/04/21 0311 04/05/21 0201  WBC 6.6 5.0 3.8*  HGB 7.2* 7.6* 7.4*  HCT 24.3* 25.5* 24.5*  PLT 224 215 211   BMET Recent Labs    04/03/21 0148 04/04/21 0311 04/05/21 0201  NA 142 138 143  K 5.4* 5.1 4.1  CL 110 104 110  CO2 27 28 25   GLUCOSE 98 128* 147*  BUN 66* 76* 64*  CREATININE 2.88* 3.10* 2.76*  CALCIUM 8.6* 8.6* 8.5*     Assessment / Plan:   Assessment: 1.  Normocytic anemia: Likely multifactorial, 03/03/2021 EGD with minor gastritis and biopsies positive for H. pylori as well as a benign appearing submucosal gastric lesion, likely lipoma, positive for H. pylori very, hemoglobin stable overnight, started on quadruple therapy for H. pylori 2.  Chronic, fairly severe constipation: Taking Colace at home but not Senokot or MiraLAX 3.  Minor bleeding per rectum: Attributed to anal fissure by GI last month, continues to have mild bleeding, none overnight 4.  CHF without volume overload 5.  CKD stage IV 6.  Hyperkalemia: Improved  Plan: 1.  Will have him do another  prep this evening and plan for colonoscopy tomorrow 2. Clear liquid diet today and NPO at midnight 3. Continue to monitor hgb with transfusion as needed <7 4. Continue H pylori therapy 5. Will need GI follow up upon discharge to ensure H pylori eradication, which has been requested  Thank you for your kind consultation, we will continue to follow.   LOS: 4 days   [name]  04/05/2021, 9:20 AM

## 2021-04-05 NOTE — Progress Notes (Signed)
Pt states he does not wish to drink the 2nd bowel prep right now. He states that he needs rest. Pt educated on the importance of drinking bowel prep for tomorrows procedure.

## 2021-04-06 ENCOUNTER — Encounter (HOSPITAL_COMMUNITY): Payer: Self-pay | Admitting: Internal Medicine

## 2021-04-06 ENCOUNTER — Encounter (HOSPITAL_COMMUNITY)
Admission: EM | Disposition: A | Discharge status: Home Health | Payer: Self-pay | Source: Home / Self Care | Attending: Internal Medicine

## 2021-04-06 ENCOUNTER — Inpatient Hospital Stay (HOSPITAL_COMMUNITY): Payer: PPO | Admitting: Anesthesiology

## 2021-04-06 ENCOUNTER — Encounter (HOSPITAL_COMMUNITY): Payer: Self-pay | Admitting: Radiology

## 2021-04-06 DIAGNOSIS — K635 Polyp of colon: Secondary | ICD-10-CM | POA: Diagnosis not present

## 2021-04-06 DIAGNOSIS — D490 Neoplasm of unspecified behavior of digestive system: Secondary | ICD-10-CM | POA: Diagnosis not present

## 2021-04-06 DIAGNOSIS — I5033 Acute on chronic diastolic (congestive) heart failure: Secondary | ICD-10-CM | POA: Diagnosis not present

## 2021-04-06 DIAGNOSIS — K625 Hemorrhage of anus and rectum: Secondary | ICD-10-CM | POA: Diagnosis not present

## 2021-04-06 HISTORY — PX: POLYPECTOMY: SHX5525

## 2021-04-06 HISTORY — PX: COLONOSCOPY WITH PROPOFOL: SHX5780

## 2021-04-06 HISTORY — PX: BIOPSY: SHX5522

## 2021-04-06 LAB — BASIC METABOLIC PANEL
Anion gap: 8 (ref 5–15)
BUN: 52 mg/dL — ABNORMAL HIGH (ref 8–23)
CO2: 24 mmol/L (ref 22–32)
Calcium: 8.7 mg/dL — ABNORMAL LOW (ref 8.9–10.3)
Chloride: 109 mmol/L (ref 98–111)
Creatinine, Ser: 2.81 mg/dL — ABNORMAL HIGH (ref 0.61–1.24)
GFR, Estimated: 23 mL/min — ABNORMAL LOW (ref >60)
Glucose, Bld: 82 mg/dL (ref 70–99)
Potassium: 3.9 mmol/L (ref 3.5–5.1)
Sodium: 141 mmol/L (ref 135–145)

## 2021-04-06 LAB — GLUCOSE, CAPILLARY
Glucose-Capillary: 84 mg/dL (ref 70–99)
Glucose-Capillary: 103 mg/dL — ABNORMAL HIGH (ref 70–99)
Glucose-Capillary: 91 mg/dL (ref 70–99)
Glucose-Capillary: 278 mg/dL — ABNORMAL HIGH (ref 70–99)
Glucose-Capillary: 172 mg/dL — ABNORMAL HIGH (ref 70–99)

## 2021-04-06 LAB — FUNGUS CULTURE WITH STAIN

## 2021-04-06 LAB — CBC
HCT: 24.6 % — ABNORMAL LOW (ref 39.0–52.0)
Hemoglobin: 7.3 g/dL — ABNORMAL LOW (ref 13.0–17.0)
MCH: 28.2 pg (ref 26.0–34.0)
MCHC: 29.7 g/dL — ABNORMAL LOW (ref 30.0–36.0)
MCV: 95 fL (ref 80.0–100.0)
Platelets: 203 10*3/uL (ref 150–400)
RBC: 2.59 MIL/uL — ABNORMAL LOW (ref 4.22–5.81)
RDW: 15.9 % — ABNORMAL HIGH (ref 11.5–15.5)
WBC: 3.6 10*3/uL — ABNORMAL LOW (ref 4.0–10.5)
nRBC: 0 % (ref 0.0–0.2)

## 2021-04-06 LAB — CULTURE, BLOOD (ROUTINE X 2)
Culture: NO GROWTH
Culture: NO GROWTH

## 2021-04-06 LAB — FUNGUS CULTURE RESULT

## 2021-04-06 LAB — FUNGAL ORGANISM REFLEX

## 2021-04-06 SURGERY — COLONOSCOPY WITH PROPOFOL
Anesthesia: Monitor Anesthesia Care

## 2021-04-06 MED ORDER — PROPOFOL 10 MG/ML IV BOLUS
INTRAVENOUS | Status: DC | PRN
  Administered 2021-04-06: 100 ug/kg/min via INTRAVENOUS

## 2021-04-06 MED ORDER — SODIUM CHLORIDE 0.9 % IV SOLN: INTRAVENOUS | Status: DC | PRN

## 2021-04-06 MED ORDER — PROPOFOL 1000 MG/100ML IV EMUL
INTRAVENOUS | Status: AC
  Filled 2021-04-06: qty 100

## 2021-04-06 SURGICAL SUPPLY — 22 items

## 2021-04-06 NOTE — Op Note (Signed)
[facility] Patient Name: Robert Chaney Procedure Date : 04/06/2021 MRN: [id number] Attending MD: Georgian Co ,  Date of Birth: [birth year] CSN: [id number] Age: 70 Admit Type: Inpatient Procedure:                Colonoscopy Indications:              Rectal bleeding, Iron deficiency anemia Providers:                Adline Mango" Pollyann Glen Person, Janee Morn, Technician Referring MD:             Hospitalist team Medicines:                Monitored Anesthesia Care Complications:            No immediate complications. Estimated Blood Loss:     Estimated blood loss was minimal. Procedure:                Pre-Anesthesia Assessment:                           - Prior to the procedure, a History and Physical                            was performed, and patient medications and                            allergies were reviewed. The patient's tolerance of                            previous anesthesia was also reviewed. The risks                            and benefits of the procedure and the sedation                            options and risks were discussed with the patient.                            All questions were answered, and informed consent                            was obtained. Prior Anticoagulants: The patient has                            taken no previous anticoagulant or antiplatelet                            agents. ASA Grade Assessment: III - A patient with                            severe systemic disease. After reviewing the risks  and benefits, the patient was deemed in                            satisfactory condition to undergo the procedure.                           After obtaining informed consent, the colonoscope                            was passed under direct vision. Throughout the                            procedure, the patient's blood pressure, pulse, and                             oxygen saturations were monitored continuously. The                            CF-HQ190L ([id number]) Olympus coloscope was                            introduced through the anus and advanced to the the                            cecum, identified by appendiceal orifice and                            ileocecal valve. The colonoscopy was performed                            without difficulty. The patient tolerated the                            procedure well. The quality of the bowel                            preparation was adequate. Scope In: 10:53:36 AM Scope Out: 09:32:35 AM Scope Withdrawal Time: 0 hours 35 minutes 41 seconds  Total Procedure Duration: 0 hours 43 minutes 8 seconds  Findings:      Multiple small and large-mouthed diverticula were found in the sigmoid       colon, transverse colon and cecum.      Six sessile polyps were found in the descending colon, transverse colon       and cecum. The polyps were 4 to 12 mm in size. These polyps were removed       with a cold snare. Resection and retrieval were complete.      Two sessile polyps were found in the ascending colon. The polyps were 2       mm in size. These polyps were removed with a jumbo cold forceps.       Resection and retrieval were complete.      A fungating and ulcerated mass was found in the rectum. The mass was       partially circumferential (involving one-half of the lumen       circumference). There was blood  clot associated with the mass. Biopsies       were taken with a cold forceps for histology. Impression:               - Diverticulosis in the sigmoid colon, in the                            transverse colon and in the cecum.                           - Six 4 to 12 mm polyps in the descending colon, in                            the transverse colon and in the cecum, removed with                            a cold snare. Resected and retrieved.                           - Two 2 mm polyps in  the ascending colon, removed                            with a jumbo cold forceps. Resected and retrieved.                           - Rule out malignancy, tumor in the rectum.                            Biopsied. Recommendation:           - Return patient to hospital ward for ongoing care.                           - It is suspected that the patient's rectal                            bleeding was due to the mass found in the rectum.                           - Await pathology results. We have requested                            expedited pathology results for the rectal mass.                           - The findings and recommendations were discussed                            with the patient and/or patient's family. Procedure Code(s):        --- Professional ---                           [phone], Colonoscopy, flexible; with removal of  tumor(s), polyp(s), or other lesion(s) by snare                            technique                           45380, 42, Colonoscopy, flexible; with biopsy,                            single or multiple Diagnosis Code(s):        --- Professional ---                           K63.5, Polyp of colon                           D49.0, Neoplasm of unspecified behavior of                            digestive system                           K62.5, Hemorrhage of anus and rectum                           D50.9, Iron deficiency anemia, unspecified                           K57.30, Diverticulosis of large intestine without                            perforation or abscess without bleeding CPT copyright 2019 American Medical Association. All rights reserved. The codes documented in this report are preliminary and upon coder review may  be revised to meet current compliance requirements. [name] "[name],  04/06/2021 11:54:58 AM Number of Addenda: 0

## 2021-04-06 NOTE — Interval H&P Note (Signed)
History and Physical Interval Note:  04/06/2021 9:13 AM  Robert Chaney  has presented today for surgery, with the diagnosis of Colonoscopy.  The various methods of treatment have been discussed with the patient and family. After consideration of risks, benefits and other options for treatment, the patient has consented to  Procedure(s): COLONOSCOPY WITH PROPOFOL (N/A) as a surgical intervention.  The patient's history has been reviewed, patient examined, no change in status, stable for surgery.  I have reviewed the patient's chart and labs.  Questions were answered to the patient's satisfaction.     [name]

## 2021-04-06 NOTE — Anesthesia Postprocedure Evaluation (Signed)
Anesthesia Post Note  Patient: Robert Chaney  Procedure(s) Performed: COLONOSCOPY WITH PROPOFOL POLYPECTOMY BIOPSY     Patient location during evaluation: PACU Anesthesia Type: MAC Level of consciousness: awake and alert Pain management: pain level controlled Vital Signs Assessment: post-procedure vital signs reviewed and stable Respiratory status: spontaneous breathing, nonlabored ventilation and respiratory function stable Cardiovascular status: blood pressure returned to baseline and stable Postop Assessment: no apparent nausea or vomiting Anesthetic complications: no   No notable events documented.  Last Vitals:  Vitals:   04/06/21 1203 04/06/21 1218  BP: 107/84 (!) 170/80  Pulse: 66 67  Resp: 16 14  Temp:  36.4 C  SpO2: 97% 94%    Last Pain:  Vitals:   04/06/21 1218  TempSrc:   PainSc: 0-No pain                 Lynda Rainwater

## 2021-04-06 NOTE — TOC Progression Note (Addendum)
Transition of Care [facility]) - Progression Note    Patient Details  Name: Robert Chaney MRN: [id number] Date of Birth: 1951-01-20  Transition of Care [facility]) CM/SW Contact  Zenon Mayo, RN Phone Number: 04/06/2021, 9:40 PM  Clinical Narrative:    from home , has home oxygen 4 liters, colonoscopy today for anemia, for possible dc, hgb 7.3. TOC will continue to follow for dc needs.        Expected Discharge Plan and Services                                                 Social Determinants of Health (SDOH) Interventions    Readmission Risk Interventions Readmission Risk Prevention Plan 03/22/2021 03/05/2021 05/12/2020  Transportation Screening Complete Complete Complete  PCP or Specialist Appt within 3-5 Days - Complete Complete  HRI or [place] - Complete Complete  Social Work Consult for [place] Planning/Counseling - Complete Complete  Palliative Care Screening - Not Applicable Not Applicable  Medication Review Press photographer) Complete Complete Complete  PCP or Specialist appointment within 3-5 days of discharge Complete - -  [place] or Home Care Consult Complete - -  SW Recovery Care/Counseling Consult Complete - -  Palliative Care Screening Not Applicable - -  [place] Not Applicable - -  Some recent data might be hidden

## 2021-04-06 NOTE — Plan of Care (Signed)

## 2021-04-06 NOTE — Progress Notes (Signed)
Mobility Specialist Progress Note:   04/06/21 1545  Mobility  Activity Refused mobility   Pt refused mobility d/t fatigue from procedure today. Will f/u as schedule permits.   Nelta Numbers Mobility Specialist  Phone [phone]

## 2021-04-06 NOTE — Progress Notes (Signed)
PROGRESS NOTE    Robert Chaney  XTK:[id number] DOB: 17-Oct-1950 DOA: 04/01/2021 PCP: [name], MD   Chief Complaint  Patient presents with   Shortness of Breath    Brief Narrative:  70 year old gentleman prior history of stage IV CKD, diabetes, chronic diastolic heart failure, chronic hypoxic respiratory failure on 4 L of nasal cannula oxygen at home, hypertension and hyperlipidemia, recently diagnosed chronic gastritis from an EGD on 03/03/2021 with H pylori presents to ED with sudden onset of chest pain and shortness of breath requiring BiPAP and currently is on 4-6 L of nasal cannula oxygen.  Patient was started on nitroglycerin drip and was weaned off . Cardiology consulted, suggested better blood pressure control and wean nitroglycerin.  Ultimately underwent colonoscopy and found to have fungating ulcerative mass on the rectum.   Assessment & Plan:   Principal Problem:   Acute on chronic diastolic CHF (congestive heart failure) (HCC) Active Problems:   DM (diabetes mellitus) with complications (HCC)   Stage 4 chronic kidney disease (HCC)   Hyperkalemia   Dyslipidemia   Hypertensive crisis   Multifocal pneumonia   Malnutrition of moderate degree   Rectal bleeding   Acute on chronic respiratory failure with hypoxia probably secondary to a combination of acute on chronic diastolic heart failure and hypertensive urgency and healthcare associated pneumonia Patient's chest pain has resolved. His shortness of breath has improved. He initially required BiPAP with improvement in his breathing and currently is on 4-5 L of nasal cannula oxygen to keep sats greater than 90%. Chest x-ray shows Slight worsening consolidation in the right upper lobe, compatible with pneumonia. There is also evidence to suggest developing multilobar bilateral bronchopneumonia. He was started on IV vancomycin and cefepime.  MRSA PCR is negative and vancomycin discontinued. Negative sputum cultures.   Negative urine for strep pneumonia and urinary antigen. Respiratory panel by RT-PCR is negative for flu and COVID-19 Patient remains afebrile and WBC count within normal limits. Procalcitonin level 0.26.   Patient is started on triple antibiotic therapy for H. pylori eradication, will discontinue cefepime and vancomycin.  acute on chronic diastolic heart failure Compensated.  Diuretics on hold.  Anemia of chronic disease/?  Anemia of blood loss Baseline hemoglobin around 8, hemoglobin dropped from 8.2-6.4 10/11.  S/p 1 unit of PRBC transfusion and hemoglobin has improved to 7.6. Recent upper GI endoscopy with H. pylori gastritis, started on triple antibiotic therapy. Colonoscopy 10/14 with multiple sessile polyps as well as ulcerating mass in the rectum consistent with likely rectal cancer.  Patient was updated.  Biopsy pending. Patient will not tolerate contrasted studies.  Will explore MRI without contrast options.   Acute on Stage IV CKD Creatinine at 2.3 on admission , increased to 3.1 with IV lasix.  Stopped IV lasix.  Diuretics holiday. Follow-up closely.  Hyperkalemia Treated with Lokelma.  Potassium improved.  Elevated troponins felt to be from demand ischemia in the setting of hypertensive crisis and worsening anemia. IV heparin is discontinued.  Hypertensive urgency Patient was initially started on nitroglycerin gtt and has been weaned off. Blood pressure parameters have improved Patient currently on hydralazine 100 mg 3 times daily, Coreg 25 mg twice daily, amlodipine 10 mg daily.  Hyperlipidemia Continue with Lipitor 20 mg daily.  Chronic constipation Improved with bowel prep.   Diabetes mellitus with hyperglycemia Insulin-dependent. CBG (last 3)  Recent Labs    04/06/21 0554 04/06/21 1149 04/06/21 1253  GLUCAP 84 103* 91   Decreased the semglee to 6 units  daily.  Continue with SSI.   Body mass index is 27.55 kg/m. Recommend outpatient follow up with  PCP.     Moderate malnutrition: Dietary on board.    DVT prophylaxis: SCDs Code Status: DNR Family Communication: None at bedside  disposition: Remains inpatient.  Status is: Inpatient  Remains inpatient appropriate because:Ongoing diagnostic testing needed not appropriate for outpatient work up, Unsafe d/c plan, and IV treatments appropriate due to intensity of illness or inability to take PO  Dispo: The patient is from: Home              Anticipated d/c is to: Home              Patient currently is not medically stable to d/c.   Difficult to place patient No       Consultants:  Cardiology Gastroenterology  Procedures: None Antimicrobials: Cefepime  Subjective: Patient seen and examined.  He came back from the colonoscopy.  I discussed with him about the results and he understands the probable nature of the rectal ulcer.  He wants to know the biopsy. Denies any chest pain or shortness of breath.  Remains overall very frail and debilitated.  Objective: Vitals:   04/06/21 1150 04/06/21 1203 04/06/21 1218 04/06/21 1259  BP:  107/84 (!) 170/80 (!) 194/80  Pulse: 68 66 67 70  Resp: 19 16 14    Temp:   97.6 F (36.4 C)   TempSrc:      SpO2: 99% 97% 94%   Weight:      Height:        Intake/Output Summary (Last 24 hours) at 04/06/2021 1414 Last data filed at 04/06/2021 1140 Gross per 24 hour  Intake 1150 ml  Output 745 ml  Net 405 ml   Filed Weights   04/05/21 0637 04/06/21 0032 04/06/21 0842  Weight: 71.4 kg 72.8 kg 72.8 kg    Examination:  General: Frail debilitated and chronically sick looking but not in any distress. Looks fairly comfortable. SpO2: 94 % O2 Flow Rate (L/min): 4 L/min FiO2 (%): 40 %  Cardiovascula S1-S2 normal.  No added sounds.    Respiratory: Bilateral clear.  Currently on 4 L of oxygen. Gastrointestinal: Soft and nontender.  Bowel sounds present. Ext: No swelling or edema.  No cyanosis.    Data Reviewed: I have personally  reviewed following labs and imaging studies  CBC: Recent Labs  Lab 04/01/21 0615 04/01/21 0630 04/02/21 1428 04/03/21 0148 04/04/21 0311 04/05/21 0201 04/06/21 0332  WBC 9.3   < > 6.1 6.6 5.0 3.8* 3.6*  NEUTROABS 7.3  --  4.8  --   --   --   --   HGB 7.7*   < > 7.7* 7.2* 7.6* 7.4* 7.3*  HCT 26.5*   < > 26.2* 24.3* 25.5* 24.5* 24.6*  MCV 98.9   < > 96.7 94.6 94.4 94.2 95.0  PLT 317   < > 261 224 215 211 203   < > = values in this interval not displayed.    Basic Metabolic Panel: Recent Labs  Lab 04/02/21 0229 04/03/21 0148 04/04/21 0311 04/05/21 0201 04/06/21 0332  NA 142 142 138 143 141  K 5.1 5.4* 5.1 4.1 3.9  CL 113* 110 104 110 109  CO2 22 27 28 25 24   GLUCOSE 187* 98 128* 147* 82  BUN 56* 66* 76* 64* 52*  CREATININE 2.38* 2.88* 3.10* 2.76* 2.81*  CALCIUM 8.5* 8.6* 8.6* 8.5* 8.7*    GFR: Estimated Creatinine Clearance:  21.7 mL/min (A) (by C-G formula based on SCr of 2.81 mg/dL (H)).  Liver Function Tests: No results for input(s): AST, ALT, ALKPHOS, BILITOT, PROT, ALBUMIN in the last 168 hours.  CBG: Recent Labs  Lab 04/05/21 1625 04/05/21 2109 04/06/21 0554 04/06/21 1149 04/06/21 1253  GLUCAP 157* 82 84 103* 91     Recent Results (from the past 240 hour(s))  Resp Panel by RT-PCR (Flu A&B, Covid) Nasopharyngeal Swab     Status: None   Collection Time: 04/01/21  6:13 AM   Specimen: Nasopharyngeal Swab; Nasopharyngeal(NP) swabs in vial transport medium  Result Value Ref Range Status   SARS Coronavirus 2 by RT PCR NEGATIVE NEGATIVE Final    Comment: (NOTE) SARS-CoV-2 target nucleic acids are NOT DETECTED.  The SARS-CoV-2 RNA is generally detectable in upper respiratory specimens during the acute phase of infection. The lowest concentration of SARS-CoV-2 viral copies this assay can detect is 138 copies/mL. A negative result does not preclude SARS-Cov-2 infection and should not be used as the sole basis for treatment or other patient management  decisions. A negative result may occur with  improper specimen collection/handling, submission of specimen other than nasopharyngeal swab, presence of viral mutation(s) within the areas targeted by this assay, and inadequate number of viral copies(<138 copies/mL). A negative result must be combined with clinical observations, patient history, and epidemiological information. The expected result is Negative.  Fact Sheet for Patients:  EntrepreneurPulse.com.au  Fact Sheet for Healthcare Providers:  IncredibleEmployment.be  This test is no t yet approved or cleared by the [place] FDA and  has been authorized for detection and/or diagnosis of SARS-CoV-2 by FDA under an Emergency Use Authorization (EUA). This EUA will remain  in effect (meaning this test can be used) for the duration of the COVID-19 declaration under Section 564(b)(1) of the Act, 21 U.S.C.section 360bbb-3(b)(1), unless the authorization is terminated  or revoked sooner.       Influenza A by PCR NEGATIVE NEGATIVE Final   Influenza B by PCR NEGATIVE NEGATIVE Final    Comment: (NOTE) The Xpert Xpress SARS-CoV-2/FLU/RSV plus assay is intended as an aid in the diagnosis of influenza from Nasopharyngeal swab specimens and should not be used as a sole basis for treatment. Nasal washings and aspirates are unacceptable for Xpert Xpress SARS-CoV-2/FLU/RSV testing.  Fact Sheet for Patients: EntrepreneurPulse.com.au  Fact Sheet for Healthcare Providers: IncredibleEmployment.be  This test is not yet approved or cleared by the [place] FDA and has been authorized for detection and/or diagnosis of SARS-CoV-2 by FDA under an Emergency Use Authorization (EUA). This EUA will remain in effect (meaning this test can be used) for the duration of the COVID-19 declaration under Section 564(b)(1) of the Act, 21 U.S.C. section 360bbb-3(b)(1), unless the  authorization is terminated or revoked.  Performed at [facility], [place] [address], [place], [place] [address]   Culture, blood (Routine X 2) w Reflex to ID Panel     Status: None   Collection Time: 04/01/21 12:05 PM   Specimen: BLOOD RIGHT HAND  Result Value Ref Range Status   Specimen Description BLOOD RIGHT HAND  Final   Special Requests   Final    BOTTLES DRAWN AEROBIC AND ANAEROBIC Blood Culture results may not be optimal due to an inadequate volume of blood received in culture bottles   Culture   Final    NO GROWTH 5 DAYS Performed at [facility], [place] [address] [place], [place] [address]  Report Status 04/06/2021 FINAL  Final  Culture, blood (Routine X 2) w Reflex to ID Panel     Status: None   Collection Time: 04/01/21 12:06 PM   Specimen: BLOOD RIGHT HAND  Result Value Ref Range Status   Specimen Description BLOOD RIGHT HAND  Final   Special Requests   Final    BOTTLES DRAWN AEROBIC ONLY Blood Culture results may not be optimal due to an inadequate volume of blood received in culture bottles   Culture   Final    NO GROWTH 5 DAYS Performed at [facility], [place] [address] [place], [place] [address]    Report Status 04/06/2021 FINAL  Final  MRSA Next Gen by PCR, Nasal     Status: None   Collection Time: 04/01/21 12:49 PM   Specimen: Nasal Mucosa; Nasal Swab  Result Value Ref Range Status   MRSA by PCR Next Gen NOT DETECTED NOT DETECTED Final    Comment: (NOTE) The GeneXpert MRSA Assay (FDA approved for NASAL specimens only), is one component of a comprehensive MRSA colonization surveillance program. It is not intended to diagnose MRSA infection nor to guide or monitor treatment for MRSA infections. Test performance is not FDA approved in patients less than 80 years old. Performed at [facility], [place] [address], [place], [place] [address]          Radiology Studies: No results found.      Scheduled Meds:  amLODipine  10 mg  Oral Daily   atorvastatin  20 mg Oral Daily   bismuth subsalicylate  124 mg Oral TID AC & HS   brimonidine  1 drop Right Eye BID   carvedilol  25 mg Oral BID   docusate sodium  200 mg Oral BID   dorzolamide  1 drop Both Eyes BID   hydrALAZINE  100 mg Oral TID   insulin aspart  0-9 Units Subcutaneous TID WC   insulin glargine-yfgn  6 Units Subcutaneous Daily   isosorbide mononitrate  60 mg Oral Daily   latanoprost  1 drop Left Eye QHS   metroNIDAZOLE  500 mg Oral TID   multivitamin  1 tablet Oral QHS   pantoprazole  40 mg Oral BID   Ensure Max Protein  11 oz Oral Daily   sodium chloride flush  3 mL Intravenous Q12H   tetracycline  500 mg Oral BID AC   Continuous Infusions:     LOS: 5 days    Total time spent: 30 minutes    Barb Merino, MD [facility]    04/06/2021, 2:14 PM

## 2021-04-06 NOTE — Transfer of Care (Signed)
Immediate Anesthesia Transfer of Care Note  Patient: Robert Chaney  Procedure(s) Performed: COLONOSCOPY WITH PROPOFOL POLYPECTOMY BIOPSY  Patient Location: PACU  Anesthesia Type:MAC  Level of Consciousness: drowsy and patient cooperative  Airway & Oxygen Therapy: Patient Spontanous Breathing and Patient connected to face mask oxygen  Post-op Assessment: Report given to RN  Post vital signs: Reviewed and stable  Last Vitals:  Vitals Value Taken Time  BP 104/47 04/06/21 1145  Temp    Pulse 69 04/06/21 1146  Resp 22 04/06/21 1146  SpO2 95 % 04/06/21 1146  Vitals shown include unvalidated device data.  Last Pain:  Vitals:   04/06/21 0842  TempSrc: Temporal  PainSc: 0-No pain         Complications: No notable events documented.

## 2021-04-07 ENCOUNTER — Inpatient Hospital Stay (HOSPITAL_COMMUNITY): Payer: PPO

## 2021-04-07 DIAGNOSIS — E118 Type 2 diabetes mellitus with unspecified complications: Secondary | ICD-10-CM | POA: Diagnosis not present

## 2021-04-07 DIAGNOSIS — I5033 Acute on chronic diastolic (congestive) heart failure: Secondary | ICD-10-CM | POA: Diagnosis not present

## 2021-04-07 DIAGNOSIS — K625 Hemorrhage of anus and rectum: Secondary | ICD-10-CM

## 2021-04-07 DIAGNOSIS — Z7189 Other specified counseling: Secondary | ICD-10-CM

## 2021-04-07 DIAGNOSIS — Z66 Do not resuscitate: Secondary | ICD-10-CM

## 2021-04-07 DIAGNOSIS — I214 Non-ST elevation (NSTEMI) myocardial infarction: Secondary | ICD-10-CM

## 2021-04-07 DIAGNOSIS — I169 Hypertensive crisis, unspecified: Secondary | ICD-10-CM | POA: Diagnosis not present

## 2021-04-07 DIAGNOSIS — J189 Pneumonia, unspecified organism: Secondary | ICD-10-CM

## 2021-04-07 DIAGNOSIS — Z515 Encounter for palliative care: Secondary | ICD-10-CM

## 2021-04-07 DIAGNOSIS — E440 Moderate protein-calorie malnutrition: Secondary | ICD-10-CM

## 2021-04-07 DIAGNOSIS — N184 Chronic kidney disease, stage 4 (severe): Secondary | ICD-10-CM

## 2021-04-07 LAB — CBC
HCT: 23 % — ABNORMAL LOW (ref 39.0–52.0)
Hemoglobin: 6.9 g/dL — CL (ref 13.0–17.0)
MCH: 28.4 pg (ref 26.0–34.0)
MCHC: 30 g/dL (ref 30.0–36.0)
MCV: 94.7 fL (ref 80.0–100.0)
Platelets: 201 10*3/uL (ref 150–400)
RBC: 2.43 MIL/uL — ABNORMAL LOW (ref 4.22–5.81)
RDW: 15.9 % — ABNORMAL HIGH (ref 11.5–15.5)
WBC: 4.8 10*3/uL (ref 4.0–10.5)
nRBC: 0 % (ref 0.0–0.2)

## 2021-04-07 LAB — PREPARE RBC (CROSSMATCH)

## 2021-04-07 LAB — HEMOGLOBIN AND HEMATOCRIT, BLOOD
HCT: 30.5 % — ABNORMAL LOW (ref 39.0–52.0)
Hemoglobin: 9.5 g/dL — ABNORMAL LOW (ref 13.0–17.0)

## 2021-04-07 LAB — GLUCOSE, CAPILLARY
Glucose-Capillary: 99 mg/dL (ref 70–99)
Glucose-Capillary: 198 mg/dL — ABNORMAL HIGH (ref 70–99)
Glucose-Capillary: 206 mg/dL — ABNORMAL HIGH (ref 70–99)
Glucose-Capillary: 172 mg/dL — ABNORMAL HIGH (ref 70–99)

## 2021-04-07 MED ORDER — SODIUM CHLORIDE 0.9% IV SOLUTION: Freq: Once | INTRAVENOUS | Status: AC

## 2021-04-07 MED ORDER — SODIUM CHLORIDE 0.9% IV SOLUTION: Freq: Once | INTRAVENOUS | Status: DC

## 2021-04-07 NOTE — Plan of Care (Signed)

## 2021-04-07 NOTE — Progress Notes (Signed)
PROGRESS NOTE    Robert Chaney  ZOX:[id number] DOB: Oct 11, 1950 DOA: 04/01/2021 PCP: [name], MD   Chief Complaint  Patient presents with   Shortness of Breath    Brief Narrative:  70 year old gentleman prior history of stage IV CKD, diabetes, chronic diastolic heart failure, chronic hypoxic respiratory failure on 4 L of nasal cannula oxygen at home, hypertension and hyperlipidemia, recently diagnosed chronic gastritis from an EGD on 03/03/2021 with H pylori presents to ED with sudden onset of chest pain and shortness of breath requiring BiPAP and currently is on 4-6 L of nasal cannula oxygen.  Patient was started on nitroglycerin drip and was weaned off . Cardiology consulted, suggested better blood pressure control and wean nitroglycerin.  Ultimately underwent colonoscopy and found to have fungating ulcerative mass on the rectum.  Needing frequent blood transfusions.   Assessment & Plan:   Principal Problem:   Acute on chronic diastolic CHF (congestive heart failure) (HCC) Active Problems:   DM (diabetes mellitus) with complications (HCC)   Stage 4 chronic kidney disease (HCC)   Hyperkalemia   Dyslipidemia   Hypertensive crisis   Multifocal pneumonia   Malnutrition of moderate degree   Rectal bleeding   Acute on chronic respiratory failure with hypoxia probably secondary to a combination of acute on chronic diastolic heart failure and hypertensive urgency and healthcare associated pneumonia: Patient's chest pain has resolved. His shortness of breath has improved. He initially required BiPAP with improvement in his breathing and currently is on 4-5 L of nasal cannula oxygen to keep sats greater than 90%. Chest x-ray shows Slight worsening consolidation in the right upper lobe, compatible with pneumonia. There is also evidence to suggest developing multilobar bilateral bronchopneumonia. He was started on IV vancomycin and cefepime.  MRSA PCR is negative and vancomycin  discontinued. Negative sputum cultures.  Negative urine for strep pneumonia and urinary antigen. Respiratory panel by RT-PCR is negative for flu and COVID-19 Patient remains afebrile and WBC count within normal limits. Procalcitonin level 0.26.   Patient is started on triple antibiotic therapy for H. pylori eradication, no indication for further antibiotics.  acute on chronic diastolic heart failure Compensated.  Diuretics on hold.  Anemia of chronic disease/?  Anemia of blood loss Baseline hemoglobin around 8, Hemoglobin 8.2-6.4-2 units of PRBC-7.6-6.9.  Additional 2 units of PRBC ordered today.  Recent upper GI endoscopy with H. pylori gastritis, started on triple antibiotic therapy. Colonoscopy 10/14 with multiple sessile polyps as well as ulcerating mass in the rectum consistent with likely rectal cancer.  Patient was updated.  Biopsy pending. Patient will not tolerate contrasted studies.  Will explore MRI without contrast options.  Suspected rectal cancer: Patient unlikely a surgical candidate given advanced cardiovascular disease, oxygen and BiPAP dependent. Case discussed with Dr. [name] from oncology. Patient cannot have CT scan with contrast.  Will avoid gadolinium in a patient with a stage IV chronic kidney disease. CT scan of the chest without contrast MRI of the abdomen and pelvis without contrast to look for any metastatic evidence of the disease.   Acute on Stage IV CKD Creatinine at 2.3 on admission , increased to 3.1 with IV lasix.  Stopped IV lasix.  Diuretics holiday. Follow-up closely.  Hyperkalemia Treated with Lokelma.  Potassium improved.  Elevated troponins felt to be from demand ischemia in the setting of hypertensive crisis and worsening anemia. IV heparin is discontinued.  Hypertensive urgency Patient was initially started on nitroglycerin gtt and has been weaned off. Blood pressure parameters  have improved Patient currently on hydralazine 100 mg 3 times  daily, Coreg 25 mg twice daily, amlodipine 10 mg daily.  Hyperlipidemia Continue with Lipitor 20 mg daily.  Chronic constipation Improved with bowel prep.   Diabetes mellitus with hyperglycemia Insulin-dependent. CBG (last 3)  Recent Labs    04/06/21 2151 04/07/21 0541 04/07/21 1112  GLUCAP 172* 99 198*   Decreased the semglee to 6 units daily.  Continue with SSI.   Body mass index is 27.98 kg/m. Recommend outpatient follow up with PCP.     Moderate malnutrition: Dietary on board.    DVT prophylaxis: SCDs Code Status: DNR Family Communication: None at bedside  disposition: Remains inpatient.  Status is: Inpatient  Remains inpatient appropriate because:Ongoing diagnostic testing needed not appropriate for outpatient work up, Unsafe d/c plan, and IV treatments appropriate due to intensity of illness or inability to take PO  Dispo: The patient is from: Home              Anticipated d/c is to: Home              Patient currently is not medically stable to d/c.   Difficult to place patient No       Consultants:  Cardiology Gastroenterology  Procedures: None Antimicrobials: Cefepime  Subjective: Patient seen and examined.  Feels fatigued and weak.  Denies any nausea vomiting.  Objective: Vitals:   04/07/21 0918 04/07/21 1010 04/07/21 1045 04/07/21 1302  BP: (!) 155/66 (!) 178/56  (!) 147/88  Pulse: 66 63  66  Resp:    19  Temp:  (!) 97.1 F (36.2 C) 97.6 F (36.4 C) (!) 97.5 F (36.4 C)  TempSrc:  Oral Oral Oral  SpO2:  98%  95%  Weight:      Height:        Intake/Output Summary (Last 24 hours) at 04/07/2021 1337 Last data filed at 04/07/2021 1302 Gross per 24 hour  Intake 1305 ml  Output 500 ml  Net 805 ml   Filed Weights   04/06/21 0032 04/06/21 0842 04/07/21 0616  Weight: 72.8 kg 72.8 kg 73.9 kg    Examination:  General: Frail debilitated and chronically sick looking but not in any distress. Looks fairly comfortable. SpO2: 95  % O2 Flow Rate (L/min): 4 L/min FiO2 (%): 40 %  Cardiovascula S1-S2 normal.  No added sounds.    Respiratory: Bilateral clear.  Currently on 4 L of oxygen. Gastrointestinal: Soft and nontender.  Bowel sounds present. Ext: No swelling or edema.  No cyanosis.    Data Reviewed: I have personally reviewed following labs and imaging studies  CBC: Recent Labs  Lab 04/01/21 0615 04/01/21 0630 04/02/21 1428 04/03/21 0148 04/04/21 0311 04/05/21 0201 04/06/21 0332 04/07/21 0220  WBC 9.3   < > 6.1 6.6 5.0 3.8* 3.6* 4.8  NEUTROABS 7.3  --  4.8  --   --   --   --   --   HGB 7.7*   < > 7.7* 7.2* 7.6* 7.4* 7.3* 6.9*  HCT 26.5*   < > 26.2* 24.3* 25.5* 24.5* 24.6* 23.0*  MCV 98.9   < > 96.7 94.6 94.4 94.2 95.0 94.7  PLT 317   < > 261 224 215 211 203 201   < > = values in this interval not displayed.    Basic Metabolic Panel: Recent Labs  Lab 04/02/21 0229 04/03/21 0148 04/04/21 0311 04/05/21 0201 04/06/21 0332  NA 142 142 138 143 141  K 5.1 5.4*  5.1 4.1 3.9  CL 113* 110 104 110 109  CO2 22 27 28 25 24   GLUCOSE 187* 98 128* 147* 82  BUN 56* 66* 76* 64* 52*  CREATININE 2.38* 2.88* 3.10* 2.76* 2.81*  CALCIUM 8.5* 8.6* 8.6* 8.5* 8.7*    GFR: Estimated Creatinine Clearance: 21.9 mL/min (A) (by C-G formula based on SCr of 2.81 mg/dL (H)).  Liver Function Tests: No results for input(s): AST, ALT, ALKPHOS, BILITOT, PROT, ALBUMIN in the last 168 hours.  CBG: Recent Labs  Lab 04/06/21 1253 04/06/21 1632 04/06/21 2151 04/07/21 0541 04/07/21 1112  GLUCAP 91 278* 172* 99 198*     Recent Results (from the past 240 hour(s))  Resp Panel by RT-PCR (Flu A&B, Covid) Nasopharyngeal Swab     Status: None   Collection Time: 04/01/21  6:13 AM   Specimen: Nasopharyngeal Swab; Nasopharyngeal(NP) swabs in vial transport medium  Result Value Ref Range Status   SARS Coronavirus 2 by RT PCR NEGATIVE NEGATIVE Final    Comment: (NOTE) SARS-CoV-2 target nucleic acids are NOT  DETECTED.  The SARS-CoV-2 RNA is generally detectable in upper respiratory specimens during the acute phase of infection. The lowest concentration of SARS-CoV-2 viral copies this assay can detect is 138 copies/mL. A negative result does not preclude SARS-Cov-2 infection and should not be used as the sole basis for treatment or other patient management decisions. A negative result may occur with  improper specimen collection/handling, submission of specimen other than nasopharyngeal swab, presence of viral mutation(s) within the areas targeted by this assay, and inadequate number of viral copies(<138 copies/mL). A negative result must be combined with clinical observations, patient history, and epidemiological information. The expected result is Negative.  Fact Sheet for Patients:  EntrepreneurPulse.com.au  Fact Sheet for Healthcare Providers:  IncredibleEmployment.be  This test is no t yet approved or cleared by the [place] FDA and  has been authorized for detection and/or diagnosis of SARS-CoV-2 by FDA under an Emergency Use Authorization (EUA). This EUA will remain  in effect (meaning this test can be used) for the duration of the COVID-19 declaration under Section 564(b)(1) of the Act, 21 U.S.C.section 360bbb-3(b)(1), unless the authorization is terminated  or revoked sooner.       Influenza A by PCR NEGATIVE NEGATIVE Final   Influenza B by PCR NEGATIVE NEGATIVE Final    Comment: (NOTE) The Xpert Xpress SARS-CoV-2/FLU/RSV plus assay is intended as an aid in the diagnosis of influenza from Nasopharyngeal swab specimens and should not be used as a sole basis for treatment. Nasal washings and aspirates are unacceptable for Xpert Xpress SARS-CoV-2/FLU/RSV testing.  Fact Sheet for Patients: EntrepreneurPulse.com.au  Fact Sheet for Healthcare Providers: IncredibleEmployment.be  This test is not yet  approved or cleared by the [place] FDA and has been authorized for detection and/or diagnosis of SARS-CoV-2 by FDA under an Emergency Use Authorization (EUA). This EUA will remain in effect (meaning this test can be used) for the duration of the COVID-19 declaration under Section 564(b)(1) of the Act, 21 U.S.C. section 360bbb-3(b)(1), unless the authorization is terminated or revoked.  Performed at [facility], [place] [address], [place], [place] [address]   Culture, blood (Routine X 2) w Reflex to ID Panel     Status: None   Collection Time: 04/01/21 12:05 PM   Specimen: BLOOD RIGHT HAND  Result Value Ref Range Status   Specimen Description BLOOD RIGHT HAND  Final   Special Requests   Final  BOTTLES DRAWN AEROBIC AND ANAEROBIC Blood Culture results may not be optimal due to an inadequate volume of blood received in culture bottles   Culture   Final    NO GROWTH 5 DAYS Performed at [facility], [place] [address], [place], [place] [address]    Report Status 04/06/2021 FINAL  Final  Culture, blood (Routine X 2) w Reflex to ID Panel     Status: None   Collection Time: 04/01/21 12:06 PM   Specimen: BLOOD RIGHT HAND  Result Value Ref Range Status   Specimen Description BLOOD RIGHT HAND  Final   Special Requests   Final    BOTTLES DRAWN AEROBIC ONLY Blood Culture results may not be optimal due to an inadequate volume of blood received in culture bottles   Culture   Final    NO GROWTH 5 DAYS Performed at [facility], [place] [address], [place], [place] [address]    Report Status 04/06/2021 FINAL  Final  MRSA Next Gen by PCR, Nasal     Status: None   Collection Time: 04/01/21 12:49 PM   Specimen: Nasal Mucosa; Nasal Swab  Result Value Ref Range Status   MRSA by PCR Next Gen NOT DETECTED NOT DETECTED Final    Comment: (NOTE) The GeneXpert MRSA Assay (FDA approved for NASAL specimens only), is one component of a comprehensive MRSA colonization  surveillance program. It is not intended to diagnose MRSA infection nor to guide or monitor treatment for MRSA infections. Test performance is not FDA approved in patients less than 76 years old. Performed at [facility], [place] [address] [place], [place] [address]          Radiology Studies: No results found.      Scheduled Meds:  sodium chloride   Intravenous Once   amLODipine  10 mg Oral Daily   atorvastatin  20 mg Oral Daily   bismuth subsalicylate  932 mg Oral TID AC & HS   brimonidine  1 drop Right Eye BID   carvedilol  25 mg Oral BID   docusate sodium  200 mg Oral BID   dorzolamide  1 drop Both Eyes BID   hydrALAZINE  100 mg Oral TID   insulin aspart  0-9 Units Subcutaneous TID WC   insulin glargine-yfgn  6 Units Subcutaneous Daily   isosorbide mononitrate  60 mg Oral Daily   latanoprost  1 drop Left Eye QHS   metroNIDAZOLE  500 mg Oral TID   multivitamin  1 tablet Oral QHS   pantoprazole  40 mg Oral BID   Ensure Max Protein  11 oz Oral Daily   sodium chloride flush  3 mL Intravenous Q12H   tetracycline  500 mg Oral BID AC   Continuous Infusions:     LOS: 6 days    Total time spent: 30 minutes    Barb Merino, MD [facility]    04/07/2021, 1:37 PM

## 2021-04-07 NOTE — Consult Note (Signed)
Palliative Care Consult Note                                  Date: 04/07/2021   Patient Name: Robert Chaney  DOB: 1951-06-15  MRN: [id number]  Age / Sex: 70 y.o., male  PCP: [name], MD Referring Physician: Barb Merino, MD  Reason for Consultation: Establishing goals of care  HPI/Patient Profile: Palliative Care consult requested for goals of care discussion in this 70 y.o. male  with past medical history of diabetes, diastolic CHF, chronic hypoxic respiratory failure (4 L home oxygen), hypertension, hyperlipidemia, CKD stage IV, and glaucoma.  He was admitted on 04/01/2021 from home with shortness of breath.  Of note patient was hospitalized in September 2022 with acute on chronic respiratory failure/CHF exacerbation and hypertensive emergency requiring BiPAP and Lasix.  EGD and bronchoscopy negative during admission.  Cardiology following due to chest pain.  Patient initially started on nitroglycerin drip which has been weaned.  Patient also followed by gastroenterologist s/p recent colonoscopy which found a fungating ulcerative mass of the rectum.  Patient is requiring frequent blood transfusions.  Suspicions for rectal cancer.  Past Medical History:  Diagnosis Date   Altered mental status    a. 05/2017 - adm with blurred vision, somnolence in setting of AKI and high blood sugar.   Anemia    CKD (chronic kidney disease), stage III (HCC)    Diabetes mellitus    Diastolic CHF, chronic ([place])    A.  03/2009 Echo: EF 60-65%, Gr II diast dysfxn   Elevated troponin    a. 2013 - troponin 1.4. b. 2019 - troponin 0.32; neg nuc 07/2017.   Glaucoma    Hyperlipidemia    HYPERCHOLESTEROLEMIA   Hypertension    MARKED LEFT VENTRICULAR HYPERTROPHY BY PREVIOUS ECHOCARDIOGRAM--HE HAS HYPERDYNAMIC LEFT VENTRICULAR SYSTOLIC FUNCTION AND HAS IMPAIRED RELAXATION BY ECHO   Hypertrophic cardiomyopathy (HCC)    NSVT (nonsustained ventricular  tachycardia)    Premature atrial contractions    PVC's (premature ventricular contractions)    SVT (supraventricular tachycardia) (HCC)      Subjective:   This NP Osborne Oman reviewed medical records, received report from team, assessed the patient and then met at the patient's bedside with Mr. Moll to discuss diagnosis, prognosis, GOC, EOL wishes disposition and options.  Patient is resting but easily aroused.  No acute distress noted.  Denies pain or shortness of breath however does endorse significant weakness/fatigue.   Concept of Palliative Care was introduced as specialized medical care for people and their families living with serious illness.  It focuses on providing relief from the symptoms and stress of a serious illness.  The goal is to improve quality of life for both the patient and the family. Values and goals of care important to patient and family were attempted to be elicited.  I created space and opportunity for patient to explore state of health prior to admission, thoughts, and feelings.  Patient shares he lives in the home alone.  Prior to admission was able to perform all ADLs independently.  Actively driving.  Does endorse decreased appetite over the past 1-2 weeks in addition to increased fatigue and weakness.  We discussed His current illness and what it means in the larger context of His on-going co-morbidities.  Lengthy discussion regarding suspicions for rectal cancer.  Natural disease trajectory and expectations were discussed.  Daunte verbalized understanding of current illness  and co-morbidities.  Patient is tearful expressing his understanding of possible rectal cancer.  He states he is awaiting final results from GI.  We discussed best case and worst case scenario.  Patient states he is tired and knows that he would not want to pursue further work-up including surgeries, chemotherapy, or radiation.  Education provided on possibility of facing end-of-life with no  treatment options in addition to continued needs for blood transfusions.  Patient verbalizes understanding expressing once he has a definitive diagnosis if truly cancer he would then wish to focus on his comfort.  He states his wishes would be to fall asleep and not wake up, not be in the hospital, and to not be in a suffering state.  I discussed the importance of continued conversation with family and their medical providers regarding overall plan of care and treatment options, ensuring decisions are within the context of the patients values and GOCs.  Questions and concerns were addressed.  Patient was encouraged to call with questions or concerns.  PMT will continue to support holistically as needed.  Life Review: Patient lives in the home alone.  Is divorced with no children.  States his brother Reliford Melland is his next of kin.  Patient is originally from [place] and have been in the [place] for 20 years.  Has worked many different jobs in the [employer] over his lifetime.   Objective:   Primary Diagnoses: Present on Admission:  Acute on chronic diastolic CHF (congestive heart failure) (HCC)  DM (diabetes mellitus) with complications (HCC)  Hyperkalemia  Dyslipidemia  Stage 4 chronic kidney disease (HCC)  Hypertensive crisis  Multifocal pneumonia   Scheduled Meds:  sodium chloride   Intravenous Once   amLODipine  10 mg Oral Daily   atorvastatin  20 mg Oral Daily   bismuth subsalicylate  426 mg Oral TID AC & HS   brimonidine  1 drop Right Eye BID   carvedilol  25 mg Oral BID   docusate sodium  200 mg Oral BID   dorzolamide  1 drop Both Eyes BID   hydrALAZINE  100 mg Oral TID   insulin aspart  0-9 Units Subcutaneous TID WC   insulin glargine-yfgn  6 Units Subcutaneous Daily   isosorbide mononitrate  60 mg Oral Daily   latanoprost  1 drop Left Eye QHS   metroNIDAZOLE  500 mg Oral TID   multivitamin  1 tablet Oral QHS   pantoprazole  40 mg Oral BID   Ensure Max Protein   11 oz Oral Daily   sodium chloride flush  3 mL Intravenous Q12H   tetracycline  500 mg Oral BID AC    Continuous Infusions:   PRN Meds: acetaminophen **OR** acetaminophen, bisacodyl, hydrALAZINE, oxyCODONE, polyethylene glycol, sodium phosphate  No Known Allergies  Review of Systems  Constitutional:  Positive for fatigue.  Neurological:  Positive for weakness.  Unless otherwise noted, a complete review of systems is negative.  Physical Exam General: NAD, frail chronically-ill appearing Cardiovascular: regular rate and rhythm Pulmonary: clear ant fields, 4 L nasal cannula Abdomen: soft, nontender, + bowel sounds Extremities: no edema, no joint deformities Skin: no rashes, warm and dry Neurological: AAOx3, mood appropriate  Vital Signs:  BP (!) 180/69   Pulse 63   Temp 98 F (36.7 C) (Oral)   Resp 19   Ht 5' 4" (1.626 m)   Wt 73.9 kg   SpO2 95%   BMI 27.98 kg/m  Pain Scale: 0-10   Pain  Score: 0-No pain  SpO2: SpO2: 95 % O2 Device:SpO2: 95 % O2 Flow Rate: .O2 Flow Rate (L/min): 4 L/min  IO: Intake/output summary:  Intake/Output Summary (Last 24 hours) at 04/07/2021 1751 Last data filed at 04/07/2021 1545 Gross per 24 hour  Intake 1538 ml  Output 500 ml  Net 1038 ml    LBM: Last BM Date: 04/06/21 Baseline Weight: Weight: 73.9 kg Most recent weight: Weight: 73.9 kg      Palliative Assessment/Data: PPS 30%    Advanced Care Planning:   Primary Decision Maker: NEXT OF KIN  Code Status/Advance Care Planning: DNR  A discussion was had today regarding advanced directives. Concepts specific to code status, artifical feeding and hydration, continued IV antibiotics and rehospitalization was had.  Patient states he does not have an advanced directive.  Confirms wishes for DNR/DNI.  Offered support and completing advanced directive however patient declines stating he does not have any family outside of his brother.  Confirms his brother Reliford is his Primary school teacher.  The difference between a aggressive medical intervention path and a palliative comfort care path was discussed.  Mr. Seabrooks is clear in his expressed wishes to continue to treat the treatable as he awaits definitive diagnosis of rectal cancer.  States he is not interested in pursuing further work-up or oncological interventions (chemotherapy/radiation therapy).  He expresses if he has a cancer diagnosis his wishes are to focus solely on his comfort and what time he has left.  He states his quality of life is important and does not wish to undergo unforeseen side effects from treatment.  Understands if final decisions are to focus on comfort blood transfusions would discontinue in addition to medications/interventions not comfort focused.  Daymond verbalizes understanding and confirms he would be at peace with this once he knows what he is facing.  Hospice and Palliative Care services outpatient were explained and offered. Patient verbalized understanding and awareness of both palliative and hospice's goals and philosophy of care.  Patient asked appropriate questions in regards to hospice at home versus residential.  He states his brother lives in [place] and is unsure he would be able to assist in his care.  He worries if he makes a decision to focus on comfort that he will be able to manage in the home alone and knows his health will rapidly decline.    Assessment & Plan:   SUMMARY OF RECOMMENDATIONS   DNR/DNI-as confirmed by patient Continue with current plan of care Osmar is clear in his expressed wishes to continue to treat the treatable as he wait for definitive diagnosis.  States his quality of life means more than his quantity and if he does have cancer he does not wish to pursue further work-up or oncological treatments but to focus solely on his comfort and end-of-life.  He understands comfort care measures versus continued treatment.  He is requesting ongoing support and  assistance with complex decision-making.  If he decides to focus on comfort he would like consideration for residential hospice to assist with symptom management and care needs as he approaches end-of-life. PMT will continue to support and follow.  I will be off service after today however my colleague [name] Kidney, NP will follow up over the next few days.  Please call team line with urgent needs.  Symptom Management:  Per Attending  Palliative Prophylaxis:  Bowel Regimen and Frequent Pain Assessment  Additional Recommendations (Limitations, Scope, Preferences): Full Scope Treatment, No Artificial Feeding, No Chemotherapy, No Hemodialysis, No  Tracheostomy, and DNR/DNI  Psycho-social/Spiritual:  Desire for further Chaplaincy support: no Additional Recommendations: Education on Hospice and ongoing goals of care discussions  Prognosis:  Poor in the setting of rectal bleeding requiring frequent blood transfusions, fungating rectal mass highly suspicious for cancer, diastolic CHF, diabetes, CKD stage IV, hypertension, generalized weakness, poor nutrition, and deconditioning.  Discharge Planning:  To Be Determined    Patient expressed understanding and was in agreement with this plan.    Time Total: 55 min.   Visit consisted of counseling and education dealing with the complex and emotionally intense issues of symptom management and palliative care in the setting of serious and potentially life-threatening illness.Greater than 50%  of this time was spent counseling and coordinating care related to the above assessment and plan.  Signed by:  Alda Lea, AGPCNP-BC Palliative Medicine Team  Phone: [phone] Pager: [phone] Amion: Bjorn Pippin   Thank you for allowing the Palliative Medicine Team to assist in the care of this patient. Please utilize secure chat with additional questions, if there is no response within 30 minutes please call the above phone number. Palliative  Medicine Team providers are available by phone from 7am to 5pm daily and can be reached through the team cell phone.  Should this patient require assistance outside of these hours, please call the patient's attending physician.

## 2021-04-08 ENCOUNTER — Inpatient Hospital Stay (HOSPITAL_COMMUNITY): Payer: PPO

## 2021-04-08 ENCOUNTER — Encounter (HOSPITAL_COMMUNITY): Payer: Self-pay | Admitting: Internal Medicine

## 2021-04-08 DIAGNOSIS — K6289 Other specified diseases of anus and rectum: Secondary | ICD-10-CM

## 2021-04-08 DIAGNOSIS — I5033 Acute on chronic diastolic (congestive) heart failure: Secondary | ICD-10-CM | POA: Diagnosis not present

## 2021-04-08 DIAGNOSIS — Z515 Encounter for palliative care: Secondary | ICD-10-CM | POA: Diagnosis not present

## 2021-04-08 DIAGNOSIS — N184 Chronic kidney disease, stage 4 (severe): Secondary | ICD-10-CM | POA: Diagnosis not present

## 2021-04-08 LAB — GLUCOSE, CAPILLARY
Glucose-Capillary: 118 mg/dL — ABNORMAL HIGH (ref 70–99)
Glucose-Capillary: 196 mg/dL — ABNORMAL HIGH (ref 70–99)
Glucose-Capillary: 88 mg/dL (ref 70–99)
Glucose-Capillary: 194 mg/dL — ABNORMAL HIGH (ref 70–99)

## 2021-04-08 LAB — CBC
HCT: 30.5 % — ABNORMAL LOW (ref 39.0–52.0)
Hemoglobin: 9.3 g/dL — ABNORMAL LOW (ref 13.0–17.0)
MCH: 28.5 pg (ref 26.0–34.0)
MCHC: 30.5 g/dL (ref 30.0–36.0)
MCV: 93.6 fL (ref 80.0–100.0)
Platelets: 201 10*3/uL (ref 150–400)
RBC: 3.26 MIL/uL — ABNORMAL LOW (ref 4.22–5.81)
RDW: 16.2 % — ABNORMAL HIGH (ref 11.5–15.5)
WBC: 5.6 10*3/uL (ref 4.0–10.5)
nRBC: 0.4 % — ABNORMAL HIGH (ref 0.0–0.2)

## 2021-04-08 LAB — TROPONIN I (HIGH SENSITIVITY): Troponin I (High Sensitivity): 82 ng/L — ABNORMAL HIGH (ref <18)

## 2021-04-08 MED ORDER — NITROGLYCERIN 0.4 MG SL SUBL
SUBLINGUAL_TABLET | SUBLINGUAL | Status: AC
  Filled 2021-04-08: qty 1

## 2021-04-08 MED ORDER — NITROGLYCERIN 0.4 MG SL SUBL
0.4000 mg | SUBLINGUAL_TABLET | SUBLINGUAL | Status: DC | PRN
  Administered 2021-04-08 (×3): 0.4 mg via SUBLINGUAL

## 2021-04-08 MED ORDER — GADOBUTROL 1 MMOL/ML IV SOLN
7.0000 mL | Freq: Once | INTRAVENOUS | Status: AC | PRN
  Administered 2021-04-08: 7 mL via INTRAVENOUS

## 2021-04-08 NOTE — Progress Notes (Signed)
At 0400 pt reported chest pain. Pt stated the pain feels like someone was standing on his chest. EKG and VS obtained. After 3 doses of SL nitro the pt stated he felt the chest pain go away. MD paged. Orders for STAT troponin placed.

## 2021-04-08 NOTE — Progress Notes (Signed)
Daily Progress Note   Patient Name: Robert Chaney       Date: 04/08/2021 DOB: May 11, 1951  Age: 70 y.o. MRN#: [id number] Attending Physician: Barb Merino, MD Primary Care Physician: [name], MD Admit Date: 04/01/2021  Reason for Consultation/Follow-up: Establishing goals of care and Psychosocial/spiritual support  Todays discussion   Robert Chaney is OOB to chair.  I introduced myself and the role of palliative medicine in his overall treatment plan.  Attempted to talk with him regarding his current medical situation.  He verbalizes frustration and fear "I am so confused everyone is telling me something different, the doctor this morning tole me I had no cancer" Education offered on his recent biopsy and that the results are not in yet. Today he agrees to MRI for further workup helping to gather information in order to lay out viable treatment options and plan, specifically regarding concerning rectal mass.   Emotional support offered.  Questions and concerns addressed.  I will f/I wth Robert Chaney in the morning    Length of Stay: 7  Current Medications: Scheduled Meds:   sodium chloride   Intravenous Once   amLODipine  10 mg Oral Daily   atorvastatin  20 mg Oral Daily   bismuth subsalicylate  888 mg Oral TID AC & HS   brimonidine  1 drop Right Eye BID   carvedilol  25 mg Oral BID   docusate sodium  200 mg Oral BID   dorzolamide  1 drop Both Eyes BID   hydrALAZINE  100 mg Oral TID   insulin aspart  0-9 Units Subcutaneous TID WC   insulin glargine-yfgn  6 Units Subcutaneous Daily   isosorbide mononitrate  60 mg Oral Daily   latanoprost  1 drop Left Eye QHS   metroNIDAZOLE  500 mg Oral TID   multivitamin  1 tablet Oral QHS   pantoprazole  40 mg Oral BID   Ensure Max Protein  11 oz  Oral Daily   sodium chloride flush  3 mL Intravenous Q12H   tetracycline  500 mg Oral BID AC    Continuous Infusions:   PRN Meds: acetaminophen **OR** acetaminophen, bisacodyl, hydrALAZINE, nitroGLYCERIN, oxyCODONE, polyethylene glycol, sodium phosphate  Physical Exam Constitutional:      Appearance: He is overweight. He is ill-appearing.  Cardiovascular:  Rate and Rhythm: Normal rate.  Pulmonary:     Effort: Pulmonary effort is normal.  Musculoskeletal:     Comments: Generalized weakness  Neurological:     Mental Status: He is alert and oriented to person, place, and time.            Vital Signs: BP (!) 148/90   Pulse 64   Temp 97.7 F (36.5 C) (Oral)   Resp (!) 22   Ht 5\' 4"  (1.626 m)   Wt 73.9 kg   SpO2 91%   BMI 27.98 kg/m  SpO2: SpO2: 91 % O2 Device: O2 Device: Nasal Cannula O2 Flow Rate: O2 Flow Rate (L/min): 5 L/min  Intake/output summary:  Intake/Output Summary (Last 24 hours) at 04/08/2021 1517 Last data filed at 04/08/2021 6010 Gross per 24 hour  Intake 1413 ml  Output 850 ml  Net 563 ml   LBM: Last BM Date: 04/06/21 Baseline Weight: Weight: 73.9 kg Most recent weight: Weight: 73.9 kg       Palliative Assessment/Data: 50 % currently      Patient Active Problem List   Diagnosis Date Noted   Rectal bleeding    Malnutrition of moderate degree 04/03/2021   Chronic constipation    Hematochezia    Chronic posterior anal fissure    Helicobacter pylori gastritis    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HCC) 03/16/2021   Abnormal CXR 03/16/2021   Elevated brain natriuretic peptide (BNP) level 03/16/2021   Left thyroid nodule 03/16/2021   GI bleed 03/16/2021   Acute blood loss anemia 03/16/2021   Acute on chronic respiratory failure with hypoxia and hypercapnia (HCC) 02/28/2021   Melena 02/28/2021   Symptomatic anemia 02/28/2021   Syncope 01/30/2021   Acute on chronic respiratory failure with hypoxemia ([place]) 01/30/2021   Multifocal pneumonia 01/30/2021    Acute on chronic respiratory failure with hypoxia ([place]) 05/16/2020   Hypertensive crisis 02/18/2020   CHF exacerbation ([place]) 01/11/2020   Acute respiratory failure ([place]) 01/11/2020   Type 2 diabetes mellitus with retinopathy, with long-term current use of insulin ([place]) 07/16/2019   Type 2 diabetes mellitus with stage 4 chronic kidney disease, with long-term current use of insulin ([place]) 07/16/2019   Type 2 diabetes mellitus with diabetic polyneuropathy, with long-term current use of insulin ([place]) 07/16/2019   OSA (obstructive sleep apnea) 03/30/2019   Hyperglycemia 07/31/2017   Near syncope 07/31/2017   Acute-on-chronic kidney injury ([place]) 07/31/2017   AKI (acute kidney injury) ([place]) 06/09/2017   Hypertensive heart disease 11/07/2016   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[place]) 07/26/2015   Dyslipidemia 05/19/2013   Diabetic hyperosmolar non-ketotic state ([place]) 07/19/2011   DM (diabetes mellitus) with complications ([place]) 93/23/5573   Stage 4 chronic kidney disease ([place]) 07/19/2011   Elevated troponin 07/19/2011   Prolonged QT interval 07/19/2011   Hyperkalemia 07/19/2011   Volume depletion 07/19/2011   Acute on chronic diastolic CHF (congestive heart failure) ([place]) 07/19/2011   Essential hypertension 07/19/2011   Elevated CPK 07/19/2011   Hypercholesterolemia 02/20/2011   Benign hypertensive heart disease without heart failure 02/20/2011    Palliative Care Assessment & Plan   Patient Profile: Palliative Care consult requested for goals of care discussion in this 70 y.o. male  with past medical history of diabetes, diastolic CHF, chronic hypoxic respiratory failure (4 L home oxygen), hypertension, hyperlipidemia, CKD stage IV, and glaucoma.    He was admitted on 04/01/2021 from home with shortness of breath.  Of note patient was hospitalized in September 2022 with acute on chronic respiratory  failure/CHF exacerbation and hypertensive emergency requiring BiPAP and Lasix.   EGD and bronchoscopy negative during admission.  Cardiology following due to chest pain.  Patient initially started on nitroglycerin drip which has been weaned.    Patient also followed by gastroenterologist s/p recent colonoscopy which found a fungating ulcerative mass of the rectum.  Patient is requiring frequent blood transfusions.  Suspicions for rectal cancer.    Assessment: Patient facing treatment option decisions, advanced directive decisions and anticipatory care needs.  Recommendations/Plan: MRI today and follow up with Robert Stines as he makes decisions regarding regarding newly diagnosed rectal mass.  Code Status:    Code Status Orders  (From admission, onward)           Start     Ordered   04/01/21 1003  Do not attempt resuscitation (DNR)  Continuous       Question Answer Comment  In the event of cardiac or respiratory ARREST Do not call a "code blue"   In the event of cardiac or respiratory ARREST Do not perform Intubation, CPR, defibrillation or ACLS   In the event of cardiac or respiratory ARREST Use medication by any route, position, wound care, and other measures to relive pain and suffering. May use oxygen, suction and manual treatment of airway obstruction as needed for comfort.      04/01/21 1020           Code Status History     Date Active Date Inactive Code Status Order ID Comments User Context   03/16/2021 2229 03/22/2021 2116 Full Code [id number]  Marcy Panning ED   02/28/2021 2327 03/10/2021 2054 DNR [id number]  Vianne Bulls, MD ED   01/30/2021 0626 02/01/2021 1933 DNR [id number]  Shela Leff, MD ED   01/30/2021 0617 01/30/2021 0626 DNR [id number]  Shela Leff, MD ED   05/10/2020 0642 05/13/2020 1831 Full Code [id number]  Vianne Bulls, MD ED   02/18/2020 1734 02/21/2020 2038 Full Code [id number]  Karmen Bongo, MD ED   01/13/2020 1738 01/16/2020 2211 Full Code [id number]  Alma Friendly, MD Inpatient   08/01/2017 0708 08/01/2017 1322 Full Code  [id number]  Quintella Baton, MD ED   06/08/2017 1209 06/10/2017 1541 Full Code [id number]  Truett Mainland, DO Inpatient      Advance Directive Documentation    Flowsheet Row Most Recent Value  Type of Advance Directive Living will  Pre-existing out of facility DNR order (yellow form or pink MOST form) --  "MOST" Form in Place? --       Prognosis:  Unable to determine  Discharge Planning: To Be Determined  Care plan was discussed with Dr Raelyn Mora  Thank you for allowing the Palliative Medicine Team to assist in the care of this patient.   Time In: 1030 Time Out: 1105 Total Time 35 Prolonged Time Billed  no       Greater than 50%  of this time was spent counseling and coordinating care related to the above assessment and plan.  Wadie Lessen, NP  Please contact Palliative Medicine Team phone at [phone] for questions and concerns.

## 2021-04-08 NOTE — Progress Notes (Signed)
PROGRESS NOTE    Robert Chaney  FGH:[id number] DOB: Mar 22, 1951 DOA: 04/01/2021 PCP: [name], MD   Chief Complaint  Patient presents with   Shortness of Breath    Brief Narrative:  70 year old gentleman prior history of stage IV CKD, diabetes, chronic diastolic heart failure, chronic hypoxic respiratory failure on 4 L of nasal cannula oxygen at home, hypertension and hyperlipidemia, recently diagnosed chronic gastritis from an EGD on 03/03/2021 with H pylori presents to ED with sudden onset of chest pain and shortness of breath requiring BiPAP and currently is on 4-6 L of nasal cannula oxygen.  Patient was started on nitroglycerin drip and was weaned off . Cardiology consulted, suggested better blood pressure control and wean nitroglycerin.  Ultimately underwent colonoscopy and found to have fungating ulcerative mass on the rectum.  Needing frequent blood transfusions.   Assessment & Plan:   Principal Problem:   Acute on chronic diastolic CHF (congestive heart failure) (HCC) Active Problems:   DM (diabetes mellitus) with complications (HCC)   Stage 4 chronic kidney disease (HCC)   Hyperkalemia   Dyslipidemia   Hypertensive crisis   Multifocal pneumonia   Malnutrition of moderate degree   Rectal bleeding   Acute on chronic respiratory failure with hypoxia probably secondary to a combination of acute on chronic diastolic heart failure and hypertensive urgency and healthcare associated pneumonia: His shortness of breath has improved. He initially required BiPAP with improvement in his breathing and currently is on 4-5 L of nasal cannula oxygen to keep sats greater than 90%. CT chest 10/15 with right upper lobe opacity.  Completed course of antibiotics with vancomycin and cefepime.  Pulmonary symptoms improved. Cultures negative for Streptococcus and Legionella. Respiratory panel by RT-PCR is negative for flu and COVID-19 Patient remains afebrile and WBC count within normal  limits. Procalcitonin level 0.26.   Patient is started on triple antibiotic therapy for H. pylori eradication, no indication for further antibiotics.  Atypical chest pain: Had episode of chest pain last night.  No EKG changes.  Improved.  Troponins mildly elevated but less than previous.  Symptomatic treatment.  acute on chronic diastolic heart failure Compensated.  Diuretics on hold.  Anemia of chronic disease/?  Anemia of blood loss Baseline hemoglobin around 8, Hemoglobin 8.2-6.4-2 units of PRBC-7.6-6.9.  Additional 2 units of PRBC given 10/15. Recent upper GI endoscopy with H. pylori gastritis, started on triple antibiotic therapy. Colonoscopy 10/14 with multiple sessile polyps as well as ulcerating mass in the rectum consistent with likely rectal cancer.  Patient was updated.  Biopsy pending.  Suspected rectal cancer: Patient unlikely a surgical candidate given advanced cardiovascular disease, oxygen and BiPAP dependent. Case discussed with Dr. [name] from oncology. Patient cannot have CT scan with contrast.  CT scan of the chest without contrast , no evidence of pulmonary metastatic disease.   MRI of the abdomen pelvis without and with contrast today planned after discussion with radiology.     Acute on Stage IV CKD Creatinine at 2.3 on admission , increased to 3.1 with IV lasix.  Diuretics were stopped.  We will recheck renal function tomorrow morning.  Hyperkalemia Treated with Lokelma.  Potassium improved.  Recheck tomorrow morning.  Elevated troponins felt to be from demand ischemia in the setting of hypertensive crisis and worsening anemia. IV heparin is discontinued.  Hypertensive urgency Patient was initially started on nitroglycerin gtt and has been weaned off. Blood pressure parameters have improved Patient currently on hydralazine 100 mg 3 times daily, Coreg 25  mg twice daily, amlodipine 10 mg daily.  Hyperlipidemia Continue with Lipitor 20 mg daily.  Chronic  constipation Improved with bowel prep.   Diabetes mellitus with hyperglycemia Insulin-dependent. CBG (last 3)  Recent Labs    04/07/21 2116 04/08/21 0615 04/08/21 1115  GLUCAP 172* 118* 196*   Decreased the semglee to 6 units daily.  Continue with SSI.   Body mass index is 27.98 kg/m. Recommend outpatient follow up with PCP.     Moderate malnutrition: Dietary on board.    DVT prophylaxis: SCDs Code Status: DNR Family Communication: None at bedside .  Brother on phone. disposition: Remains inpatient.  Status is: Inpatient  Remains inpatient appropriate because:Ongoing diagnostic testing needed not appropriate for outpatient work up, Unsafe d/c plan, and IV treatments appropriate due to intensity of illness or inability to take PO  Dispo: The patient is from: Home              Anticipated d/c is to: Home              Patient currently is not medically stable to d/c.   Difficult to place patient No       Consultants:  Cardiology Gastroenterology Palliative.  Procedures: None Antimicrobials: Flagyl and tetracycline  Subjective: Patient seen and examined.  Currently without any issues.  Had chest discomfort overnight that improved with nitroglycerin.  Objective: Vitals:   04/07/21 2156 04/08/21 0035 04/08/21 0405 04/08/21 0827  BP: (!) 167/59 (!) 153/123 (!) 148/90   Pulse: 66 60 64   Resp: (!) 23 16 (!) 22   Temp: 98.8 F (37.1 C)   97.7 F (36.5 C)  TempSrc: Oral   Oral  SpO2: 94% 90% 91%   Weight:      Height:        Intake/Output Summary (Last 24 hours) at 04/08/2021 1358 Last data filed at 04/08/2021 0835 Gross per 24 hour  Intake 1413 ml  Output 850 ml  Net 563 ml   Filed Weights   04/06/21 0032 04/06/21 0842 04/07/21 0616  Weight: 72.8 kg 72.8 kg 73.9 kg    Examination:  General: Frail debilitated and chronically sick looking but not in any distress. Looks fairly comfortable.  Sitting in the chair. SpO2: 91 % O2 Flow Rate  (L/min): 5 L/min FiO2 (%): 40 %  Cardiovascula S1-S2 normal.  No added sounds.    Respiratory: Bilateral clear.  Currently on 4 L of oxygen. Gastrointestinal: Soft and nontender.  Bowel sounds present. Ext: No swelling or edema.  No cyanosis.    Data Reviewed: I have personally reviewed following labs and imaging studies  CBC: Recent Labs  Lab 04/02/21 1428 04/03/21 0148 04/04/21 0311 04/05/21 0201 04/06/21 0332 04/07/21 0220 04/07/21 1932 04/08/21 0338  WBC 6.1   < > 5.0 3.8* 3.6* 4.8  --  5.6  NEUTROABS 4.8  --   --   --   --   --   --   --   HGB 7.7*   < > 7.6* 7.4* 7.3* 6.9* 9.5* 9.3*  HCT 26.2*   < > 25.5* 24.5* 24.6* 23.0* 30.5* 30.5*  MCV 96.7   < > 94.4 94.2 95.0 94.7  --  93.6  PLT 261   < > 215 211 203 201  --  201   < > = values in this interval not displayed.    Basic Metabolic Panel: Recent Labs  Lab 04/02/21 0229 04/03/21 0148 04/04/21 0311 04/05/21 0201 04/06/21 0332  NA 142  142 138 143 141  K 5.1 5.4* 5.1 4.1 3.9  CL 113* 110 104 110 109  CO2 22 27 28 25 24   GLUCOSE 187* 98 128* 147* 82  BUN 56* 66* 76* 64* 52*  CREATININE 2.38* 2.88* 3.10* 2.76* 2.81*  CALCIUM 8.5* 8.6* 8.6* 8.5* 8.7*    GFR: Estimated Creatinine Clearance: 21.9 mL/min (A) (by C-G formula based on SCr of 2.81 mg/dL (H)).  Liver Function Tests: No results for input(s): AST, ALT, ALKPHOS, BILITOT, PROT, ALBUMIN in the last 168 hours.  CBG: Recent Labs  Lab 04/07/21 1112 04/07/21 1603 04/07/21 2116 04/08/21 0615 04/08/21 1115  GLUCAP 198* 206* 172* 118* 196*     Recent Results (from the past 240 hour(s))  Resp Panel by RT-PCR (Flu A&B, Covid) Nasopharyngeal Swab     Status: None   Collection Time: 04/01/21  6:13 AM   Specimen: Nasopharyngeal Swab; Nasopharyngeal(NP) swabs in vial transport medium  Result Value Ref Range Status   SARS Coronavirus 2 by RT PCR NEGATIVE NEGATIVE Final    Comment: (NOTE) SARS-CoV-2 target nucleic acids are NOT DETECTED.  The  SARS-CoV-2 RNA is generally detectable in upper respiratory specimens during the acute phase of infection. The lowest concentration of SARS-CoV-2 viral copies this assay can detect is 138 copies/mL. A negative result does not preclude SARS-Cov-2 infection and should not be used as the sole basis for treatment or other patient management decisions. A negative result may occur with  improper specimen collection/handling, submission of specimen other than nasopharyngeal swab, presence of viral mutation(s) within the areas targeted by this assay, and inadequate number of viral copies(<138 copies/mL). A negative result must be combined with clinical observations, patient history, and epidemiological information. The expected result is Negative.  Fact Sheet for Patients:  EntrepreneurPulse.com.au  Fact Sheet for Healthcare Providers:  IncredibleEmployment.be  This test is no t yet approved or cleared by the [place] FDA and  has been authorized for detection and/or diagnosis of SARS-CoV-2 by FDA under an Emergency Use Authorization (EUA). This EUA will remain  in effect (meaning this test can be used) for the duration of the COVID-19 declaration under Section 564(b)(1) of the Act, 21 U.S.C.section 360bbb-3(b)(1), unless the authorization is terminated  or revoked sooner.       Influenza A by PCR NEGATIVE NEGATIVE Final   Influenza B by PCR NEGATIVE NEGATIVE Final    Comment: (NOTE) The Xpert Xpress SARS-CoV-2/FLU/RSV plus assay is intended as an aid in the diagnosis of influenza from Nasopharyngeal swab specimens and should not be used as a sole basis for treatment. Nasal washings and aspirates are unacceptable for Xpert Xpress SARS-CoV-2/FLU/RSV testing.  Fact Sheet for Patients: EntrepreneurPulse.com.au  Fact Sheet for Healthcare Providers: IncredibleEmployment.be  This test is not yet approved or  cleared by the [place] FDA and has been authorized for detection and/or diagnosis of SARS-CoV-2 by FDA under an Emergency Use Authorization (EUA). This EUA will remain in effect (meaning this test can be used) for the duration of the COVID-19 declaration under Section 564(b)(1) of the Act, 21 U.S.C. section 360bbb-3(b)(1), unless the authorization is terminated or revoked.  Performed at [facility], [place] [address] [place], [place] [address]   Culture, blood (Routine X 2) w Reflex to ID Panel     Status: None   Collection Time: 04/01/21 12:05 PM   Specimen: BLOOD RIGHT HAND  Result Value Ref Range Status   Specimen Description BLOOD RIGHT HAND  Final  Special Requests   Final    BOTTLES DRAWN AEROBIC AND ANAEROBIC Blood Culture results may not be optimal due to an inadequate volume of blood received in culture bottles   Culture   Final    NO GROWTH 5 DAYS Performed at [facility], [place] [address] [place], [place] [address]    Report Status 04/06/2021 FINAL  Final  Culture, blood (Routine X 2) w Reflex to ID Panel     Status: None   Collection Time: 04/01/21 12:06 PM   Specimen: BLOOD RIGHT HAND  Result Value Ref Range Status   Specimen Description BLOOD RIGHT HAND  Final   Special Requests   Final    BOTTLES DRAWN AEROBIC ONLY Blood Culture results may not be optimal due to an inadequate volume of blood received in culture bottles   Culture   Final    NO GROWTH 5 DAYS Performed at [facility], [place] [address] [place], [place] [address]    Report Status 04/06/2021 FINAL  Final  MRSA Next Gen by PCR, Nasal     Status: None   Collection Time: 04/01/21 12:49 PM   Specimen: Nasal Mucosa; Nasal Swab  Result Value Ref Range Status   MRSA by PCR Next Gen NOT DETECTED NOT DETECTED Final    Comment: (NOTE) The GeneXpert MRSA Assay (FDA approved for NASAL specimens only), is one component of a comprehensive MRSA colonization surveillance program. It is not  intended to diagnose MRSA infection nor to guide or monitor treatment for MRSA infections. Test performance is not FDA approved in patients less than 8 years old. Performed at [facility], [place] [address] [place], [place] [address]          Radiology Studies: CT CHEST WO CONTRAST  Result Date: 04/07/2021 CLINICAL DATA:  GI cancer, for staging EXAM: CT CHEST WITHOUT CONTRAST TECHNIQUE: Multidetector CT imaging of the chest was performed following the standard protocol without IV contrast. COMPARISON:  03/07/2021 FINDINGS: Cardiovascular: Mild cardiomegaly. No pericardial effusion. Hypodense blood pool relative to myocardium, suggesting anemia. No evidence of thoracic aortic aneurysm. Atherosclerotic calcifications of the aortic arch. Mild coronary atherosclerosis of the LAD and right coronary artery. Mediastinum/Nodes: No suspicious mediastinal lymphadenopathy. Visualized thyroid is unremarkable. Lungs/Pleura: Patchy opacity in the posterior right upper lobe (series 8/image 89), suggesting pneumonia. Moderate right and small left pleural effusions. Mild interlobular septal thickening in the upper lobes suggests very mild superimposed edema is possible. Mild bilateral lower lobe compressive atelectasis. 4 mm ground-glass nodule in the anterior right upper lobe (series 8/image 83), possibly related to the suspected edema, but warranting attention on follow-up. No pneumothorax. Upper Abdomen: Visualized upper abdomen is grossly unremarkable, noting vascular calcifications. Musculoskeletal: Degenerative changes of the visualized thoracolumbar spine. IMPRESSION: Right upper lobe opacity, suspicious for pneumonia. Moderate right and small left pleural effusions. Suspected mild superimposed pulmonary edema. 4 mm ground-glass nodule in the anterior right upper lobe, possibly related to the suspected edema, but warranting attention on follow-up. Consider follow-up CT chest in 3-6 months. No findings  specific for metastatic disease in the chest. Aortic Atherosclerosis (ICD10-I70.0). Electronically Signed   By: Julian Hy M.D.   On: 04/07/2021 21:12        Scheduled Meds:  sodium chloride   Intravenous Once   amLODipine  10 mg Oral Daily   atorvastatin  20 mg Oral Daily   bismuth subsalicylate  629 mg Oral TID AC & HS   brimonidine  1 drop  Right Eye BID   carvedilol  25 mg Oral BID   docusate sodium  200 mg Oral BID   dorzolamide  1 drop Both Eyes BID   hydrALAZINE  100 mg Oral TID   insulin aspart  0-9 Units Subcutaneous TID WC   insulin glargine-yfgn  6 Units Subcutaneous Daily   isosorbide mononitrate  60 mg Oral Daily   latanoprost  1 drop Left Eye QHS   metroNIDAZOLE  500 mg Oral TID   multivitamin  1 tablet Oral QHS   pantoprazole  40 mg Oral BID   Ensure Max Protein  11 oz Oral Daily   sodium chloride flush  3 mL Intravenous Q12H   tetracycline  500 mg Oral BID AC   Continuous Infusions:     LOS: 7 days    Total time spent: 30 minutes    Barb Merino, MD [facility]    04/08/2021, 1:58 PM

## 2021-04-08 NOTE — Plan of Care (Signed)

## 2021-04-09 ENCOUNTER — Other Ambulatory Visit: Payer: Self-pay | Admitting: Hematology

## 2021-04-09 ENCOUNTER — Encounter (HOSPITAL_COMMUNITY): Payer: Self-pay | Admitting: Internal Medicine

## 2021-04-09 DIAGNOSIS — D500 Iron deficiency anemia secondary to blood loss (chronic): Secondary | ICD-10-CM | POA: Diagnosis not present

## 2021-04-09 DIAGNOSIS — R0602 Shortness of breath: Secondary | ICD-10-CM | POA: Diagnosis not present

## 2021-04-09 DIAGNOSIS — B9681 Helicobacter pylori [H. pylori] as the cause of diseases classified elsewhere: Secondary | ICD-10-CM

## 2021-04-09 DIAGNOSIS — C20 Malignant neoplasm of rectum: Secondary | ICD-10-CM | POA: Diagnosis not present

## 2021-04-09 DIAGNOSIS — I5033 Acute on chronic diastolic (congestive) heart failure: Secondary | ICD-10-CM | POA: Diagnosis not present

## 2021-04-09 DIAGNOSIS — E119 Type 2 diabetes mellitus without complications: Secondary | ICD-10-CM

## 2021-04-09 LAB — BASIC METABOLIC PANEL
Anion gap: 5 (ref 5–15)
BUN: 58 mg/dL — ABNORMAL HIGH (ref 8–23)
CO2: 23 mmol/L (ref 22–32)
Calcium: 8.8 mg/dL — ABNORMAL LOW (ref 8.9–10.3)
Chloride: 109 mmol/L (ref 98–111)
Creatinine, Ser: 3.4 mg/dL — ABNORMAL HIGH (ref 0.61–1.24)
GFR, Estimated: 18 mL/min — ABNORMAL LOW (ref >60)
Glucose, Bld: 95 mg/dL (ref 70–99)
Potassium: 4.6 mmol/L (ref 3.5–5.1)
Sodium: 137 mmol/L (ref 135–145)

## 2021-04-09 LAB — CBC
HCT: 29.1 % — ABNORMAL LOW (ref 39.0–52.0)
Hemoglobin: 9 g/dL — ABNORMAL LOW (ref 13.0–17.0)
MCH: 29.3 pg (ref 26.0–34.0)
MCHC: 30.9 g/dL (ref 30.0–36.0)
MCV: 94.8 fL (ref 80.0–100.0)
Platelets: 174 10*3/uL (ref 150–400)
RBC: 3.07 MIL/uL — ABNORMAL LOW (ref 4.22–5.81)
RDW: 16 % — ABNORMAL HIGH (ref 11.5–15.5)
WBC: 4.4 10*3/uL (ref 4.0–10.5)
nRBC: 0 % (ref 0.0–0.2)

## 2021-04-09 LAB — SURGICAL PATHOLOGY

## 2021-04-09 LAB — MAGNESIUM: Magnesium: 2.3 mg/dL (ref 1.7–2.4)

## 2021-04-09 LAB — GLUCOSE, CAPILLARY
Glucose-Capillary: 75 mg/dL (ref 70–99)
Glucose-Capillary: 195 mg/dL — ABNORMAL HIGH (ref 70–99)
Glucose-Capillary: 108 mg/dL — ABNORMAL HIGH (ref 70–99)
Glucose-Capillary: 207 mg/dL — ABNORMAL HIGH (ref 70–99)

## 2021-04-09 MED ORDER — FUROSEMIDE 40 MG PO TABS
40.0000 mg | ORAL_TABLET | Freq: Every day | ORAL | Status: DC
  Administered 2021-04-09 – 2021-04-14 (×6): 40 mg via ORAL
  Filled 2021-04-09 (×6): qty 1

## 2021-04-09 NOTE — Consult Note (Signed)
[place]  [phone] [phone]   MEDICAL ONCOLOGY - INITIAL CONSULTATION  Referral MD: Dr. Barb Merino  Reason for Referral: Rectal adenocarcinoma  HPI: Mr. Gains is a 70 year old male originally from [place] but living in the [place] for 20 years, with a past medical history significant for stage IV CKD, diabetes mellitus, chronic diastolic CHF, chronic hypoxic respiratory failure on 4 L of O2 baseline, hypertension, hyperlipidemia.  He presented to the emergency department with shortness of breath.  Initially, he was hypotensive and started on nitroglycerin drip.  Concern was for CHF and breathing did improve with BiPAP.  Chest x-ray showed slight worsening consolidation in the right lower lobe compatible with pneumonia.  He had a drop in his hemoglobin from 7.7-6.4 this admission.  He did have an EGD performed during recent hospital admission on 03/03/2021 which showed mild gastritis and was positive for H. pylori which was never treated.  This admission, he underwent colonoscopy on 04/06/2021 and he was found to have a fungating and ulcerated mass in the rectum which was partially circumferential.  This was biopsied and results consistent with invasive moderately differentiated colorectal adenocarcinoma.  A CT of the chest without contrast was performed on 04/07/2021 which again showed a right upper lobe opacity suspicious for pneumonia, moderate right and small left pleural effusions, no findings specific for metastatic disease in the chest.  MRI of the abdomen/pelvis was performed on 04/08/2021 which showed an enhancing, masslike concentric wall thickening involving the lower rectum corresponding to the recently biopsied rectal mass, no definite findings for metastatic disease.  Medical oncology was asked see patient make recommendations regarding his newly diagnosed rectal adenocarcinoma.  Patient was seen and notes that he still was having some rectal  bleeding till yesterday and thus far has needed 3 units of PRBCs this hospitalization.  He notes that he still feels short of breath and only about 50% back to his baseline respiratory status. Prior to admission he notes he was living independently at home.  Was on chronic oxygen at 4 L/min via nasal cannula.  Had help with cleaning the home.  Managed his own food and did drive himself to clinic appointments.  He understands that this is a significant change in functional status for him.  He understands his new diagnosis of rectal adenocarcinoma.  He notes he has talked to his family though he understands there will be limitations and treatments with his significant medical comorbidities he would like to consider any treatments that might be possible.  Patient notes that he probably has had some weight loss but cannot clearly quantify this.   Past Medical History:  Diagnosis Date   Altered mental status    a. 05/2017 - adm with blurred vision, somnolence in setting of AKI and high blood sugar.   Anemia    CKD (chronic kidney disease), stage III (HCC)    Diabetes mellitus    Diastolic CHF, chronic ([place])    A.  03/2009 Echo: EF 60-65%, Gr II diast dysfxn   Elevated troponin    a. 2013 - troponin 1.4. b. 2019 - troponin 0.32; neg nuc 07/2017.   Glaucoma    Hyperlipidemia    HYPERCHOLESTEROLEMIA   Hypertension    MARKED LEFT VENTRICULAR HYPERTROPHY BY PREVIOUS ECHOCARDIOGRAM--HE HAS HYPERDYNAMIC LEFT VENTRICULAR SYSTOLIC FUNCTION AND HAS IMPAIRED RELAXATION BY ECHO   Hypertrophic cardiomyopathy (HCC)    NSVT (nonsustained ventricular tachycardia)    Premature atrial contractions    PVC's (premature ventricular  contractions)    SVT (supraventricular tachycardia) (HCC)   :   Past Surgical History:  Procedure Laterality Date   BIOPSY  03/03/2021   Procedure: BIOPSY;  Surgeon: Jackquline Denmark, MD;  Location: [place];  Service: Endoscopy;;   BIOPSY  04/06/2021   Procedure: BIOPSY;   Surgeon: [name], MD;  Location: [facility];  Service: Gastroenterology;;   BRONCHIAL BIOPSY  03/05/2021   Procedure: BRONCHIAL BIOPSIES;  Surgeon: Candee Furbish, MD;  Location: [place];  Service: Pulmonary;;   BRONCHIAL BRUSHINGS  03/05/2021   Procedure: BRONCHIAL BRUSHINGS;  Surgeon: Candee Furbish, MD;  Location: [place];  Service: Pulmonary;;   BRONCHIAL WASHINGS  03/05/2021   Procedure: BRONCHIAL WASHINGS;  Surgeon: Candee Furbish, MD;  Location: [place];  Service: Pulmonary;;   COLONOSCOPY WITH PROPOFOL N/A 04/06/2021   Procedure: COLONOSCOPY WITH PROPOFOL;  Surgeon: [name], MD;  Location: [place];  Service: Gastroenterology;  Laterality: N/A;   ESOPHAGOGASTRODUODENOSCOPY (EGD) WITH PROPOFOL N/A 03/03/2021   Procedure: ESOPHAGOGASTRODUODENOSCOPY (EGD) WITH PROPOFOL;  Surgeon: Jackquline Denmark, MD;  Location[facility] ENDOSCOPY;  Service: Endoscopy;  Laterality: N/A;   HEMOSTASIS CONTROL  03/05/2021   Procedure: HEMOSTASIS CONTROL;  Surgeon: Candee Furbish, MD;  Location: [facility] ENDOSCOPY;  Service: Pulmonary;;   NO PAST SURGERIES     POLYPECTOMY  04/06/2021   Procedure: POLYPECTOMY;  Surgeon: [name], MD;  Location: [place];  Service: Gastroenterology;;   VIDEO BRONCHOSCOPY N/A 03/05/2021   Procedure: VIDEO BRONCHOSCOPY WITH FLUORO;  Surgeon: Candee Furbish, MD;  Location: [facility] ENDOSCOPY;  Service: Pulmonary;  Laterality: N/A;  :   Current Facility-Administered Medications  Medication Dose Route Frequency Provider Last Rate Last Admin   0.9 %  sodium chloride infusion (Manually program via Guardrails IV Fluids)   Intravenous Once Shela Leff, MD       acetaminophen (TYLENOL) tablet 650 mg  650 mg Oral Q6H PRN Karmen Bongo, MD       Or   acetaminophen (TYLENOL) suppository 650 mg  650 mg Rectal Q6H PRN Karmen Bongo, MD       amLODipine (NORVASC) tablet 10 mg  10 mg Oral Daily Karmen Bongo, MD   10 mg at 04/09/21 0854   atorvastatin  (LIPITOR) tablet 20 mg  20 mg Oral Daily Karmen Bongo, MD   20 mg at 04/09/21 0854   bisacodyl (DULCOLAX) EC tablet 5 mg  5 mg Oral Daily PRN Karmen Bongo, MD       bismuth subsalicylate (PEPTO BISMOL) chewable tablet 524 mg  524 mg Oral TID AC & HS [name], MD   524 mg at 04/09/21 1033   brimonidine (ALPHAGAN) 0.2 % ophthalmic solution 1 drop  1 drop Right Eye BID Karmen Bongo, MD   1 drop at 04/09/21 0904   carvedilol (COREG) tablet 25 mg  25 mg Oral BID Barrett, Rhonda G, PA-C   25 mg at 04/09/21 0857   docusate sodium (COLACE) capsule 200 mg  200 mg Oral BID Karmen Bongo, MD   200 mg at 04/09/21 0853   dorzolamide (TRUSOPT) 2 % ophthalmic solution 1 drop  1 drop Both Eyes BID Karmen Bongo, MD   1 drop at 04/09/21 0903   furosemide (LASIX) tablet 40 mg  40 mg Oral Daily Barb Merino, MD   40 mg at 04/09/21 0855   hydrALAZINE (APRESOLINE) injection 5 mg  5 mg Intravenous Q4H PRN Karmen Bongo, MD   5 mg at 04/02/21 1236  hydrALAZINE (APRESOLINE) tablet 100 mg  100 mg Oral TID Karmen Bongo, MD   100 mg at 04/09/21 0901   insulin aspart (novoLOG) injection 0-9 Units  0-9 Units Subcutaneous TID WC Karmen Bongo, MD   2 Units at 04/09/21 1211   insulin glargine-yfgn [facility]) injection 6 Units  6 Units Subcutaneous Daily Hosie Poisson, MD   6 Units at 04/09/21 0856   isosorbide mononitrate (IMDUR) 24 hr tablet 60 mg  60 mg Oral Daily Hosie Poisson, MD   60 mg at 04/09/21 0854   latanoprost (XALATAN) 0.005 % ophthalmic solution 1 drop  1 drop Left Eye Ivery Quale, MD   1 drop at 04/08/21 2259   metroNIDAZOLE (FLAGYL) tablet 500 mg  500 mg Oral TID [name], MD   500 mg at 04/09/21 1638   multivitamin (RENA-VIT) tablet 1 tablet  1 tablet Oral QHS Hosie Poisson, MD   1 tablet at 04/08/21 2253   nitroGLYCERIN (NITROSTAT) SL tablet 0.4 mg  0.4 mg Sublingual Q5 min PRN Barb Merino, MD   0.4 mg at 04/08/21 0422   oxyCODONE (Oxy IR/ROXICODONE) immediate release  tablet 5 mg  5 mg Oral Q4H PRN Karmen Bongo, MD   5 mg at 04/08/21 1608   pantoprazole (PROTONIX) EC tablet 40 mg  40 mg Oral BID Hosie Poisson, MD   40 mg at 04/09/21 0854   polyethylene glycol (MIRALAX / GLYCOLAX) packet 17 g  17 g Oral Daily PRN Karmen Bongo, MD   17 g at 04/02/21 1236   protein supplement (ENSURE MAX) liquid  11 oz Oral Daily Hosie Poisson, MD   11 oz at 04/08/21 4665   sodium chloride flush (NS) 0.9 % injection 3 mL  3 mL Intravenous Lillia Mountain, MD   3 mL at 04/09/21 0904   sodium phosphate (FLEET) 7-19 GM/118ML enema 1 enema  1 enema Rectal Once PRN Karmen Bongo, MD       tetracycline (SUMYCIN) capsule 500 mg  500 mg Oral BID AC [name], MD   500 mg at 04/09/21 0654     No Known Allergies:   Family History  Problem Relation Age of Onset   Hypertension Father    Stroke Father    Hypertension Mother    Diabetes Brother    Hypertension Brother    Diabetes Brother   :   Social History   Socioeconomic History   Marital status: Legally Separated    Spouse name: Not on file   Number of children: Not on file   Years of education: Not on file   Highest education level: Not on file  Occupational History   Occupation: retired    Fish farm manager: [employer]    Comment: At [employer]  Tobacco Use   Smoking status: Never   Smokeless tobacco: Never  Vaping Use   Vaping Use: Never used  Substance and Sexual Activity   Alcohol use: No   Drug use: No   Sexual activity: Never  Other Topics Concern   Not on file  Social History Narrative   Originally from [place].    Social Determinants of Health   Financial Resource Strain: Not on file  Food Insecurity: Not on file  Transportation Needs: Not on file  Physical Activity: Not on file  Stress: Not on file  Social Connections: Not on file  Intimate Partner Violence: Not on file  :  Review of Systems: A comprehensive 14 point review of systems was  negative except as noted  in the HPI.  Exam: Patient Vitals for the past 24 hrs:  BP Temp Temp src Pulse Resp SpO2 Height Weight  04/09/21 1109 (!) 158/136 98.4 F (36.9 C) Oral (!) 59 19 98 % -- --  04/09/21 0907 119/86 98.2 F (36.8 C) Oral 69 20 97 % -- --  04/09/21 0857 (!) 186/61 -- -- 68 -- -- -- --  04/09/21 0303 (!) 144/69 (!) 97 F (36.1 C) Oral -- 19 92 % -- --  04/09/21 0255 -- -- -- -- -- -- _0  (1.727 m) 76.1 kg  04/09/21 0000 130/64 -- -- -- -- -- -- --  04/08/21 2305 (!) 131/55 (!) 96.9 F (36.1 C) Axillary -- 19 94 % -- --  04/08/21 2053 -- -- -- 66 18 95 % -- --  04/08/21 2024 (!) 163/68 -- Oral -- 16 96 % -- --  04/08/21 2000 -- (!) 97.5 F (36.4 C) Oral -- -- -- -- --    General: Elderly gentleman mild shortness of breath with talking Eyes:  no scleral icterus.   ENT: Moist mucous membranes.   Neck was without thyromegaly.   Lymphatics:  Negative cervical, supraclavicular or axillary adenopathy.   Respiratory: lungs were scattered rales at the bases. Cardiovascular:  Regular rate and rhythm, S1/S2, without murmur, rub or gallop.  There was no pedal edema.   GI:  abdomen was soft, mild tenderness to palpation left lower quadrant Musculoskeletal:  no spinal tenderness of palpation of vertebral spine.   Skin exam was without echymosis, petichae.   Neuro exam was nonfocal. Patient was alert and oriented.  Attention was good.   Language was appropriate.  Mood was normal without depression.  Speech was not pressured.  Thought content was not tangential.     LABS Lab Results  Component Value Date   WBC 4.4 04/09/2021   HGB 9.0 (L) 04/09/2021   HCT 29.1 (L) 04/09/2021   PLT 174 04/09/2021   GLUCOSE 95 04/09/2021   CHOL 127 05/13/2020   TRIG 103 05/13/2020   HDL 43 05/13/2020   LDLCALC 63 05/13/2020   ALT 12 03/19/2021   AST 9 (L) 03/19/2021   NA 137 04/09/2021   K 4.6 04/09/2021   CL 109 04/09/2021   CREATININE 3.40 (H) 04/09/2021   BUN 58 (H) 04/09/2021   CO2 23 04/09/2021    . CBC Latest Ref Rng & Units 04/09/2021 04/08/2021 04/07/2021  WBC 4.0 - 10.5 K/uL 4.4 5.6 -  Hemoglobin 13.0 - 17.0 g/dL 9.0(L) 9.3(L) 9.5(L)  Hematocrit 39.0 - 52.0 % 29.1(L) 30.5(L) 30.5(L)  Platelets 150 - 400 K/uL 174 201 -    . CMP Latest Ref Rng & Units 04/09/2021 04/06/2021 04/05/2021  Glucose 70 - 99 mg/dL 95 82 147(H)  BUN 8 - 23 mg/dL 58(H) 52(H) 64(H)  Creatinine 0.61 - 1.24 mg/dL 3.40(H) 2.81(H) 2.76(H)  Sodium 135 - 145 mmol/L 137 141 143  Potassium 3.5 - 5.1 mmol/L 4.6 3.9 4.1  Chloride 98 - 111 mmol/L 109 109 110  CO2 22 - 32 mmol/L _1 Calcium 8.9 - 10.3 mg/dL 8.8(L) 8.7(L) 8.5(L)  Total Protein 6.5 - 8.1 g/dL - - -  Total Bilirubin 0.3 - 1.2 mg/dL - - -  Alkaline Phos 38 - 126 U/L - - -  AST 15 - 41 U/L - - -  ALT 0 - 44 U/L - - -   . Lab Results  Component Value Date  IRON 19 (L) 02/28/2021   TIBC 339 02/28/2021   IRONPCTSAT 6 (L) 02/28/2021   (Iron and TIBC)  Lab Results  Component Value Date   FERRITIN 23 (L) 02/28/2021   SURGICAL PATHOLOGY  CASE: [id number]  PATIENT: Arie Sabina  Surgical Pathology Report      Clinical History: colonoscopy (cm)      FINAL MICROSCOPIC DIAGNOSIS:   A. COLON, ASCENDING AND CECUM, POLYPECTOMY:  - Tubular adenoma (s).  - No high-grade dysplasia or carcinoma.  - Inflammatory polyp with erosion.   B. COLON, DESCENDING, POLYPECTOMY:  - Tubular adenoma (s).  - No high-grade dysplasia or carcinoma.   C. RECTUM, MASS, BIOPSY:  - Invasive moderately differentiated colorectal adenocarcinoma.  - See comment.     DG Chest 1 View  Result Date: 03/20/2021 CLINICAL DATA:  Volume overload EXAM: CHEST  1 VIEW COMPARISON:  03/19/2021, CT 03/07/2021 FINDINGS: Cardiomegaly with vascular congestion. Mild perihilar ground-glass opacity overall diminished compared to the radiograph from 09/26. Trace pleural effusions. More focal consolidation in the right mid lung and medial right base are also slightly  improved. Mild consolidation medial left base. Aortic atherosclerosis. No pneumothorax IMPRESSION: 1. Cardiomegaly with overall decreased bilateral ground-glass opacity/possible edema compared to prior. 2. There is residual consolidation in the right mid and lower lung and the medial left base which may reflect pneumonia Electronically Signed   By: Donavan Foil M.D.   On: 03/20/2021 19:42   CT CHEST WO CONTRAST  Result Date: 04/07/2021 CLINICAL DATA:  GI cancer, for staging EXAM: CT CHEST WITHOUT CONTRAST TECHNIQUE: Multidetector CT imaging of the chest was performed following the standard protocol without IV contrast. COMPARISON:  03/07/2021 FINDINGS: Cardiovascular: Mild cardiomegaly. No pericardial effusion. Hypodense blood pool relative to myocardium, suggesting anemia. No evidence of thoracic aortic aneurysm. Atherosclerotic calcifications of the aortic arch. Mild coronary atherosclerosis of the LAD and right coronary artery. Mediastinum/Nodes: No suspicious mediastinal lymphadenopathy. Visualized thyroid is unremarkable. Lungs/Pleura: Patchy opacity in the posterior right upper lobe (series 8/image 89), suggesting pneumonia. Moderate right and small left pleural effusions. Mild interlobular septal thickening in the upper lobes suggests very mild superimposed edema is possible. Mild bilateral lower lobe compressive atelectasis. 4 mm ground-glass nodule in the anterior right upper lobe (series 8/image 83), possibly related to the suspected edema, but warranting attention on follow-up. No pneumothorax. Upper Abdomen: Visualized upper abdomen is grossly unremarkable, noting vascular calcifications. Musculoskeletal: Degenerative changes of the visualized thoracolumbar spine. IMPRESSION: Right upper lobe opacity, suspicious for pneumonia. Moderate right and small left pleural effusions. Suspected mild superimposed pulmonary edema. 4 mm ground-glass nodule in the anterior right upper lobe, possibly related to  the suspected edema, but warranting attention on follow-up. Consider follow-up CT chest in 3-6 months. No findings specific for metastatic disease in the chest. Aortic Atherosclerosis (ICD10-I70.0). Electronically Signed   By: Julian Hy M.D.   On: 04/07/2021 21:12   MR PELVIS W WO CONTRAST  Result Date: 04/08/2021 CLINICAL DATA:  Rectal mass on colonoscopy, suspected rectal cancer, metastatic disease evaluation EXAM: MRI ABDOMEN AND PELVIS WITHOUT AND WITH CONTRAST TECHNIQUE: Multiplanar multisequence MR imaging of the abdomen and pelvis was performed both before and after the administration of intravenous contrast. CONTRAST:  77m GADAVIST GADOBUTROL 1 MMOL/ML IV SOLN COMPARISON:  None. FINDINGS: Markedly limited evaluation due to motion degradation. Lower chest: Moderate right and small left pleural effusions. Hepatobiliary: No focal hepatic lesion is seen, noting motion degradation. Gallbladder is grossly unremarkable. No intrahepatic or extrahepatic ductal dilatation. Pancreas:  Grossly unremarkable. Spleen:  Grossly unremarkable. Adrenals/Urinary Tract:  Adrenal glands are grossly unremarkable. Subcentimeter bilateral renal cysts.  No hydronephrosis. Trabeculated bladder. Stomach/Bowel: Stomach is grossly unremarkable. No evidence of bowel obstruction. Enhancing masslike concentric thickening involving the lower rectum (series 29/image 30), corresponding to the recently biopsied rectal Mass. Vascular/Lymphatic:  No evidence of aneurysm. Marked limited evaluation for lymph nodes given motion degradation. Reproductive: Prostate is grossly unremarkable. Other:  No abdominopelvic ascites. Musculoskeletal: No focal osseous lesions. IMPRESSION: Markedly limited evaluation due to motion degradation. Enhancing, masslike concentric wall thickening involving the lower rectum, corresponding to recently biopsied rectal mass. No definite findings of metastatic disease, noting significant limitations. Assuming  this patient has rectal adenocarcinoma, for more accurate staging, consider follow-up CT abdomen/pelvis with contrast in 6 weeks. Electronically Signed   By: Julian Hy M.D.   On: 04/08/2021 20:10   MR ABDOMEN W WO CONTRAST  Result Date: 04/08/2021 CLINICAL DATA:  Rectal mass on colonoscopy, suspected rectal cancer, metastatic disease evaluation EXAM: MRI ABDOMEN AND PELVIS WITHOUT AND WITH CONTRAST TECHNIQUE: Multiplanar multisequence MR imaging of the abdomen and pelvis was performed both before and after the administration of intravenous contrast. CONTRAST:  19m GADAVIST GADOBUTROL 1 MMOL/ML IV SOLN COMPARISON:  None. FINDINGS: Markedly limited evaluation due to motion degradation. Lower chest: Moderate right and small left pleural effusions. Hepatobiliary: No focal hepatic lesion is seen, noting motion degradation. Gallbladder is grossly unremarkable. No intrahepatic or extrahepatic ductal dilatation. Pancreas:  Grossly unremarkable. Spleen:  Grossly unremarkable. Adrenals/Urinary Tract:  Adrenal glands are grossly unremarkable. Subcentimeter bilateral renal cysts.  No hydronephrosis. Trabeculated bladder. Stomach/Bowel: Stomach is grossly unremarkable. No evidence of bowel obstruction. Enhancing masslike concentric thickening involving the lower rectum (series 29/image 30), corresponding to the recently biopsied rectal mass. Vascular/Lymphatic:  No evidence of aneurysm. Markedly limited evaluation for lymph nodes given motion degradation. Reproductive: Prostate is grossly unremarkable. Other:  No abdominopelvic ascites. Musculoskeletal: No focal osseous lesions. IMPRESSION: Markedly limited evaluation due to motion degradation. Enhancing, masslike concentric wall thickening involving the lower rectum, corresponding to recently biopsied rectal mass. No definite findings of metastatic disease, noting significant limitations. Assuming this patient has rectal adenocarcinoma, for more accurate staging,  consider follow-up CT abdomen/pelvis with contrast in 6 weeks. Electronically Signed   By: SJulian HyM.D.   On: 04/08/2021 20:03   DG Chest Port 1 View  Result Date: 04/01/2021 CLINICAL DATA:  70year old male with history of shortness of breath. EXAM: PORTABLE CHEST 1 VIEW COMPARISON:  Chest x-ray 03/20/2021. FINDINGS: Persistent consolidation in the inferior aspect of the right upper lobe. Patchy areas of interstitial prominence and several ill-defined opacities are also now noted elsewhere throughout the lungs bilaterally. Small right pleural effusion. No left pleural effusion. No pneumothorax. No cephalization of the pulmonary vasculature. Heart size is mildly enlarged. The patient is rotated to the right on today's exam, resulting in distortion of the mediastinal contours and reduced diagnostic sensitivity and specificity for mediastinal pathology. Atherosclerotic calcifications in the thoracic aorta. IMPRESSION: 1. Slight worsening consolidation in the right upper lobe, compatible with pneumonia. There is also evidence to suggest developing multilobar bilateral bronchopneumonia, as above. 2. Cardiomegaly. 3. Aortic atherosclerosis. Electronically Signed   By: DVinnie LangtonM.D.   On: 04/01/2021 06:25   DG CHEST PORT 1 VIEW  Result Date: 03/19/2021 CLINICAL DATA:  Dyspnea. EXAM: PORTABLE CHEST 1 VIEW COMPARISON:  03/17/2021 FINDINGS: AP view of the chest again demonstrates airspace disease and consolidation in the mid right lung. There appears  to be a small amount of pleural fluid particularly on the right side. Hazy densities in the left lung may represent a small amount of left lung airspace disease. Heart size is enlarged but stable. Atherosclerotic calcifications at the aortic arch. Trachea is midline. Negative for a pneumothorax. IMPRESSION: 1. No significant change in the airspace disease and consolidation in the right lung. Suspect a small amount of pleural fluid. 2. Probable mild  airspace disease in left lung. Electronically Signed   By: Markus Daft M.D.   On: 03/19/2021 08:54   DG Chest Port 1 View  Result Date: 03/17/2021 CLINICAL DATA:  Hypoxic respiratory failure. EXAM: PORTABLE CHEST 1 VIEW COMPARISON:  03/16/2021 FINDINGS: Normal scratch set stable cardiomediastinal contours. No pleural effusion identified. There is diffuse pulmonary vascular congestion. Dense airspace consolidation within the right upper lobe is unchanged. Mild patchy airspace densities are noted within the right lower lobe, left midlung and left base, also unchanged. IMPRESSION: 1. No change in aeration to the lungs compared with prior exam. 2. Stable pulmonary vascular congestion. Electronically Signed   By: Kerby Moors M.D.   On: 03/17/2021 08:10   DG Chest Port 1 View  Result Date: 03/16/2021 CLINICAL DATA:  Dyspnea. EXAM: PORTABLE CHEST 1 VIEW COMPARISON:  March 05, 2021 FINDINGS: Marked severity infiltrate is seen within the mid right lung with moderate severity infiltrate noted along the right lung base. This represents a new finding when compared to the prior study. There is no evidence of a pleural effusion or pneumothorax. The heart size and mediastinal contours are within normal limits. Marked severity calcification of the thoracic aorta is seen. Multilevel degenerative changes are noted throughout the thoracic spine. IMPRESSION: Moderate to marked severity infiltrates within the mid right lung and right lung base. Electronically Signed   By: Virgina Norfolk M.D.   On: 03/16/2021 20:57   DG Swallowing Func-Speech Pathology  Result Date: 03/19/2021 Table formatting from the original result was not included. Objective Swallowing Evaluation: Type of Study: MBS-Modified Barium Swallow Study  Patient Details Name: SPENSER HARREN MRN: [id number] Date of Birth: Mar 19, 1951 Today's Date: 03/19/2021 Time: SLP Start Time (ACUTE ONLY): 1350 -SLP Stop Time (ACUTE ONLY): 1407 SLP Time Calculation (min)  (ACUTE ONLY): 17 min Past Medical History: Past Medical History: Diagnosis Date  Altered mental status   a. 05/2017 - adm with blurred vision, somnolence in setting of AKI and high blood sugar.  Anemia   CKD (chronic kidney disease), stage III (HCC)   Diabetes mellitus   Diastolic CHF, chronic ([place])   A.  03/2009 Echo: EF 60-65%, Gr II diast dysfxn  Elevated troponin   a. 2013 - troponin 1.4. b. 2019 - troponin 0.32; neg nuc 07/2017.  Glaucoma   Hyperlipidemia   HYPERCHOLESTEROLEMIA  Hypertension   MARKED LEFT VENTRICULAR HYPERTROPHY BY PREVIOUS ECHOCARDIOGRAM--HE HAS HYPERDYNAMIC LEFT VENTRICULAR SYSTOLIC FUNCTION AND HAS IMPAIRED RELAXATION BY ECHO  Hypertrophic cardiomyopathy (HCC)   NSVT (nonsustained ventricular tachycardia) (HCC)   Premature atrial contractions   PVC's (premature ventricular contractions)   SVT (supraventricular tachycardia) ([place])  Past Surgical History: Past Surgical History: Procedure Laterality Date  BIOPSY  03/03/2021  Procedure: BIOPSY;  Surgeon: Jackquline Denmark, MD;  Location: [place];  Service: Endoscopy;;  BRONCHIAL BIOPSY  03/05/2021  Procedure: BRONCHIAL BIOPSIES;  Surgeon: Candee Furbish, MD;  Location: [facility] ENDOSCOPY;  Service: Pulmonary;;  BRONCHIAL BRUSHINGS  03/05/2021  Procedure: BRONCHIAL BRUSHINGS;  Surgeon: Candee Furbish, MD;  Location: [facility] ENDOSCOPY;  Service: Pulmonary;;  BRONCHIAL WASHINGS  03/05/2021  Procedure: BRONCHIAL WASHINGS;  Surgeon: Candee Furbish, MD;  Location: [facility] ENDOSCOPY;  Service: Pulmonary;;  ESOPHAGOGASTRODUODENOSCOPY (EGD) WITH PROPOFOL N/A 03/03/2021  Procedure: ESOPHAGOGASTRODUODENOSCOPY (EGD) WITH PROPOFOL;  Surgeon: Jackquline Denmark, MD;  Location: [facility] ENDOSCOPY;  Service: Endoscopy;  Laterality: N/A;  HEMOSTASIS CONTROL  03/05/2021  Procedure: HEMOSTASIS CONTROL;  Surgeon: Candee Furbish, MD;  Location: [facility] ENDOSCOPY;  Service: Pulmonary;;  NO PAST SURGERIES    VIDEO BRONCHOSCOPY N/A 03/05/2021  Procedure: VIDEO BRONCHOSCOPY WITH FLUORO;  Surgeon: Candee Furbish, MD;  Location: [facility] ENDOSCOPY;  Service: Pulmonary;  Laterality: N/A; HPI: This 70 y.o. African American male nonsmoker presented to the [facility] Emergency Department via EMS with complaints of respiratory distress.  Per EMS, the patient was found to be hypoxic.  SpO2 73% despite non-rebreather mask. He was reportedly placed on CPAP and subsequently became unresponsive.  He was supported with bag-mask ventilation and transported to the ER.  In the ER, the patient has been placed on BiPAP.Of note, the patient was recently hospitalized for pneumonia and was discharged on 9/17 and was treated with cefepime/vancomycin which was switched to Unasyn.  He underwent bronch/BAL/TBBx/brushings 03/05/21. All unrevealing but no BAL cell count/diff obtained. MD concerned about potential aspiration as patient has had recurrent PNA's recently.  Subjective: pleasant, sitting EOB eating dinner Assessment / Plan / Recommendation CHL IP CLINICAL IMPRESSIONS 03/19/2021 Clinical Impression Pt presents with pharyngeal dysphagia characterized by reduced anterior laryngeal movement and a pharyngeal delay which resulted in penetration (PAS 3, 5) of thin and nectar thick liquids. Prompted coughing was ineffective in expelling penetrate. Various postural modifications were attempted and a chin tuck posture with right head turn proved most effective eliminating laryngeal invasion. Mild pyriform sinus residue was noted with liquids and this was cleared with pt's independent use of secondary swallows. Pt's pharyngeal timing and clearance were adequate for solids. A regular texture diet with thin liquids is recommended at this time with observance of swallowing precautions to reduce aspiration risk. SLP will follow for treatment. SLP Visit Diagnosis Dysphagia, pharyngeal phase (R13.13) Attention and concentration deficit following -- Frontal lobe and executive function deficit following -- Impact on safety and function  Mild aspiration risk   CHL IP TREATMENT RECOMMENDATION 03/19/2021 Treatment Recommendations Therapy as outlined in treatment plan below   Prognosis 03/19/2021 Prognosis for Safe Diet Advancement Good Barriers to Reach Goals -- Barriers/Prognosis Comment -- CHL IP DIET RECOMMENDATION 03/19/2021 SLP Diet Recommendations Regular solids;Nectar thick liquid Liquid Administration via Cup;No straw Medication Administration Whole meds with liquid Compensations Slow rate;Small sips/bites;Chin tuck with right head turn, secondary swallows Postural Changes Seated upright at 90 degrees   CHL IP OTHER RECOMMENDATIONS 03/19/2021 Recommended Consults -- Oral Care Recommendations Oral care BID Other Recommendations --   CHL IP FOLLOW UP RECOMMENDATIONS 03/17/2021 Follow up Recommendations None   CHL IP FREQUENCY AND DURATION 03/19/2021 Speech Therapy Frequency (ACUTE ONLY) min 2x/week Treatment Duration 2 weeks      CHL IP ORAL PHASE 03/19/2021 Oral Phase WFL Oral - Pudding Teaspoon -- Oral - Pudding Cup -- Oral - Honey Teaspoon -- Oral - Honey Cup -- Oral - Nectar Teaspoon -- Oral - Nectar Cup -- Oral - Nectar Straw -- Oral - Thin Teaspoon -- Oral - Thin Cup -- Oral - Thin Straw -- Oral - Puree -- Oral - Mech Soft -- Oral - Regular -- Oral - Multi-Consistency -- Oral - Pill -- Oral Phase - Comment --  CHL IP PHARYNGEAL PHASE 03/19/2021 Pharyngeal Phase Impaired Pharyngeal- Pudding Teaspoon -- Pharyngeal -- Pharyngeal- Pudding Cup -- Pharyngeal -- Pharyngeal- Honey Teaspoon -- Pharyngeal -- Pharyngeal- Honey Cup -- Pharyngeal -- Pharyngeal- Nectar Teaspoon -- Pharyngeal -- Pharyngeal- Nectar Cup Delayed swallow initiation-vallecula;Delayed swallow initiation-pyriform sinuses;Penetration/Aspiration during swallow;Reduced anterior laryngeal mobility;Pharyngeal residue - pyriform Pharyngeal Material enters airway, CONTACTS cords and not ejected out Pharyngeal- Nectar Straw -- Pharyngeal -- Pharyngeal- Thin Teaspoon -- Pharyngeal --  Pharyngeal- Thin Cup Delayed swallow initiation-vallecula;Delayed swallow initiation-pyriform sinuses;Penetration/Aspiration during swallow;Reduced anterior laryngeal mobility;Pharyngeal residue - pyriform Pharyngeal Material enters airway, remains ABOVE vocal cords and not ejected out;Material enters airway, CONTACTS cords and not ejected out Pharyngeal- Thin Straw -- Pharyngeal -- Pharyngeal- Puree Reduced anterior laryngeal mobility Pharyngeal -- Pharyngeal- Mechanical Soft -- Pharyngeal -- Pharyngeal- Regular Reduced anterior laryngeal mobility Pharyngeal -- Pharyngeal- Multi-consistency -- Pharyngeal -- Pharyngeal- Pill Reduced anterior laryngeal mobility Pharyngeal -- Pharyngeal Comment --  CHL IP CERVICAL ESOPHAGEAL PHASE 03/19/2021 Cervical Esophageal Phase WFL Pudding Teaspoon -- Pudding Cup -- Honey Teaspoon -- Honey Cup -- Nectar Teaspoon -- Nectar Cup -- Nectar Straw -- Thin Teaspoon -- Thin Cup -- Thin Straw -- Puree -- Mechanical Soft -- Regular -- Multi-consistency -- Pill -- Cervical Esophageal Comment -- Shanika I. Hardin Negus, [place], [place] Office number [phone] Pager [phone] Horton Marshall 03/19/2021, 3:49 PM              ECHOCARDIOGRAM LIMITED BUBBLE STUDY  Result Date: 03/19/2021    ECHOCARDIOGRAM LIMITED REPORT   Patient Name:   TERREZ ANDER Date of Exam: 03/19/2021 Medical Rec #:  [id number]     Height:       68.0 in Accession #:    [id number]    Weight:       165.1 lb Date of Birth:  [birth year]      BSA:          1.884 m Patient Age:    67 years      BP:           198/81 mmHg Patient Gender: M             HR:           69 bpm. Exam Location:  Inpatient Procedure: Limited Echo Indications:    Hypoxia [300808]  History:        Patient has prior history of Echocardiogram examinations, most                 recent 01/30/2021. CHF and Hypertrophic Cardiomyopathy; Risk                 Factors:Diabetes, Dyslipidemia and Hypertension.  Sonographer:    Bernadene Person RDCS Referring Phys: [id number] [place]  1. Limited echo for shunting in the setting of hypoxia  2. Agitated saline contrast bubble study was positive with shunting observed after >6 cardiac cycles suggestive of intrapulmonary shunting. A moderate number of microbubbles were noted, consistent with a more significant shunt.  3. Left ventricular ejection fraction, by estimation, is 65 to 70%. The left ventricle has normal function. Comparison(s): Changes from prior study are noted. 01/30/2021: LVEF 70-75%. FINDINGS  Left Ventricle: Left ventricular ejection fraction, by estimation, is 65 to 70%. The left ventricle has normal function. IAS/Shunts: No atrial level shunt detected by color flow Doppler. Agitated saline contrast was given intravenously to evaluate for intracardiac shunting. Agitated saline contrast bubble study was positive with shunting observed after >6 cardiac  cycles suggestive of intrapulmonary shunting. Lyman Bishop MD Electronically signed by Lyman Bishop MD Signature Date/Time: 03/19/2021/3:31:33 PM    Final    VAS [place] LOWER EXTREMITY VENOUS (DVT)  Result Date: 03/17/2021  Lower Venous DVT Study Patient Name:  DION SIBAL  Date of Exam:   03/17/2021 Medical Rec #: [id number]      Accession #:    [id number] Date of Birth: 1950/09/29       Patient Gender: M Patient Age:   49 years Exam Location:  [facility] Procedure:      VAS [place] LOWER EXTREMITY VENOUS (DVT) Referring Phys: Renee Pain --------------------------------------------------------------------------------  Indications: Edema.  Limitations: Acoustic shadowing due to overlying calcification. Comparison Study: 01-14-2020 Bilateral lower extremity venous study was negative                   for DVT.                    01-08-2021 ABI w/ TBI showed bilateral absent DPA, moderate                   right arterial disease, mild left arterial disease. Performing Technologist: Darlin Coco RDMS, RVT  Examination  Guidelines: A complete evaluation includes B-mode imaging, spectral Doppler, color Doppler, and power Doppler as needed of all accessible portions of each vessel. Bilateral testing is considered an integral part of a complete examination. Limited examinations for reoccurring indications may be performed as noted. The reflux portion of the exam is performed with the patient in reverse Trendelenburg.  +---------+---------------+---------+-----------+----------+--------------+ RIGHT    CompressibilityPhasicitySpontaneityPropertiesThrombus Aging +---------+---------------+---------+-----------+----------+--------------+ CFV      Full           Yes      Yes                                 +---------+---------------+---------+-----------+----------+--------------+ SFJ      Full                                                        +---------+---------------+---------+-----------+----------+--------------+ FV Prox  Full                                                        +---------+---------------+---------+-----------+----------+--------------+ FV Mid   Full                                                        +---------+---------------+---------+-----------+----------+--------------+ FV DistalFull                                                        +---------+---------------+---------+-----------+----------+--------------+ PFV      Full                                                        +---------+---------------+---------+-----------+----------+--------------+  POP      Full           Yes      Yes                                 +---------+---------------+---------+-----------+----------+--------------+ PTV      Full                                                        +---------+---------------+---------+-----------+----------+--------------+ PERO     Full                                                         +---------+---------------+---------+-----------+----------+--------------+ Gastroc  Full                                                        +---------+---------------+---------+-----------+----------+--------------+   +---------+---------------+---------+-----------+----------+--------------+ LEFT     CompressibilityPhasicitySpontaneityPropertiesThrombus Aging +---------+---------------+---------+-----------+----------+--------------+ CFV      Full           Yes      Yes                                 +---------+---------------+---------+-----------+----------+--------------+ SFJ      Full                                                        +---------+---------------+---------+-----------+----------+--------------+ FV Prox  Full                                                        +---------+---------------+---------+-----------+----------+--------------+ FV Mid   Full                                                        +---------+---------------+---------+-----------+----------+--------------+ FV DistalFull                                                        +---------+---------------+---------+-----------+----------+--------------+ PFV      Full                                                        +---------+---------------+---------+-----------+----------+--------------+  POP      Full           Yes      Yes                                 +---------+---------------+---------+-----------+----------+--------------+ PTV      Full                                                        +---------+---------------+---------+-----------+----------+--------------+ PERO     Full                                                        +---------+---------------+---------+-----------+----------+--------------+ Gastroc  Full                                                         +---------+---------------+---------+-----------+----------+--------------+     Summary: RIGHT: - There is no evidence of deep vein thrombosis in the lower extremity.  - No cystic structure found in the popliteal fossa.  LEFT: - There is no evidence of deep vein thrombosis in the lower extremity.  - No cystic structure found in the popliteal fossa.  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 *See table(s) above for measurements and observations. Electronically signed by Servando Snare MD on 03/17/2021 at 5:23:46 PM.    Final      DG Chest 1 View  Result Date: 03/20/2021 CLINICAL DATA:  Volume overload EXAM: CHEST  1 VIEW COMPARISON:  03/19/2021, CT 03/07/2021 FINDINGS: Cardiomegaly with vascular congestion. Mild perihilar ground-glass opacity overall diminished compared to the radiograph from 09/26. Trace pleural effusions. More focal consolidation in the right mid lung and medial right base are also slightly improved. Mild consolidation medial left base. Aortic atherosclerosis. No pneumothorax IMPRESSION: 1. Cardiomegaly with overall decreased bilateral ground-glass opacity/possible edema compared to prior. 2. There is residual consolidation in the right mid and lower lung and the medial left base which may reflect pneumonia Electronically Signed   By: Donavan Foil M.D.   On: 03/20/2021 19:42   CT CHEST WO CONTRAST  Result Date: 04/07/2021 CLINICAL DATA:  GI cancer, for staging EXAM: CT CHEST WITHOUT CONTRAST TECHNIQUE: Multidetector CT imaging of the chest was performed following the standard protocol without IV contrast. COMPARISON:  03/07/2021 FINDINGS: Cardiovascular: Mild cardiomegaly. No pericardial effusion. Hypodense blood pool relative to myocardium, suggesting anemia. No evidence of thoracic aortic aneurysm. Atherosclerotic calcifications of the aortic arch. Mild coronary atherosclerosis of the LAD and right coronary artery. Mediastinum/Nodes: No suspicious mediastinal  lymphadenopathy. Visualized thyroid is unremarkable. Lungs/Pleura: Patchy opacity in the posterior right upper lobe (series 8/image 89), suggesting pneumonia. Moderate right and small left pleural effusions. Mild interlobular septal thickening in the upper lobes suggests very mild superimposed edema is possible. Mild bilateral lower lobe compressive atelectasis. 4 mm ground-glass nodule in the anterior right upper lobe (series 8/image 83), possibly related to the suspected edema, but warranting attention on  follow-up. No pneumothorax. Upper Abdomen: Visualized upper abdomen is grossly unremarkable, noting vascular calcifications. Musculoskeletal: Degenerative changes of the visualized thoracolumbar spine. IMPRESSION: Right upper lobe opacity, suspicious for pneumonia. Moderate right and small left pleural effusions. Suspected mild superimposed pulmonary edema. 4 mm ground-glass nodule in the anterior right upper lobe, possibly related to the suspected edema, but warranting attention on follow-up. Consider follow-up CT chest in 3-6 months. No findings specific for metastatic disease in the chest. Aortic Atherosclerosis (ICD10-I70.0). Electronically Signed   By: Julian Hy M.D.   On: 04/07/2021 21:12   MR PELVIS W WO CONTRAST  Result Date: 04/08/2021 CLINICAL DATA:  Rectal mass on colonoscopy, suspected rectal cancer, metastatic disease evaluation EXAM: MRI ABDOMEN AND PELVIS WITHOUT AND WITH CONTRAST TECHNIQUE: Multiplanar multisequence MR imaging of the abdomen and pelvis was performed both before and after the administration of intravenous contrast. CONTRAST:  30m GADAVIST GADOBUTROL 1 MMOL/ML IV SOLN COMPARISON:  None. FINDINGS: Markedly limited evaluation due to motion degradation. Lower chest: Moderate right and small left pleural effusions. Hepatobiliary: No focal hepatic lesion is seen, noting motion degradation. Gallbladder is grossly unremarkable. No intrahepatic or extrahepatic ductal  dilatation. Pancreas:  Grossly unremarkable. Spleen:  Grossly unremarkable. Adrenals/Urinary Tract:  Adrenal glands are grossly unremarkable. Subcentimeter bilateral renal cysts.  No hydronephrosis. Trabeculated bladder. Stomach/Bowel: Stomach is grossly unremarkable. No evidence of bowel obstruction. Enhancing masslike concentric thickening involving the lower rectum (series 29/image 30), corresponding to the recently biopsied rectal Mass. Vascular/Lymphatic:  No evidence of aneurysm. Marked limited evaluation for lymph nodes given motion degradation. Reproductive: Prostate is grossly unremarkable. Other:  No abdominopelvic ascites. Musculoskeletal: No focal osseous lesions. IMPRESSION: Markedly limited evaluation due to motion degradation. Enhancing, masslike concentric wall thickening involving the lower rectum, corresponding to recently biopsied rectal mass. No definite findings of metastatic disease, noting significant limitations. Assuming this patient has rectal adenocarcinoma, for more accurate staging, consider follow-up CT abdomen/pelvis with contrast in 6 weeks. Electronically Signed   By: SJulian HyM.D.   On: 04/08/2021 20:10   MR ABDOMEN W WO CONTRAST  Result Date: 04/08/2021 CLINICAL DATA:  Rectal mass on colonoscopy, suspected rectal cancer, metastatic disease evaluation EXAM: MRI ABDOMEN AND PELVIS WITHOUT AND WITH CONTRAST TECHNIQUE: Multiplanar multisequence MR imaging of the abdomen and pelvis was performed both before and after the administration of intravenous contrast. CONTRAST:  748mGADAVIST GADOBUTROL 1 MMOL/ML IV SOLN COMPARISON:  None. FINDINGS: Markedly limited evaluation due to motion degradation. Lower chest: Moderate right and small left pleural effusions. Hepatobiliary: No focal hepatic lesion is seen, noting motion degradation. Gallbladder is grossly unremarkable. No intrahepatic or extrahepatic ductal dilatation. Pancreas:  Grossly unremarkable. Spleen:  Grossly  unremarkable. Adrenals/Urinary Tract:  Adrenal glands are grossly unremarkable. Subcentimeter bilateral renal cysts.  No hydronephrosis. Trabeculated bladder. Stomach/Bowel: Stomach is grossly unremarkable. No evidence of bowel obstruction. Enhancing masslike concentric thickening involving the lower rectum (series 29/image 30), corresponding to the recently biopsied rectal mass. Vascular/Lymphatic:  No evidence of aneurysm. Markedly limited evaluation for lymph nodes given motion degradation. Reproductive: Prostate is grossly unremarkable. Other:  No abdominopelvic ascites. Musculoskeletal: No focal osseous lesions. IMPRESSION: Markedly limited evaluation due to motion degradation. Enhancing, masslike concentric wall thickening involving the lower rectum, corresponding to recently biopsied rectal mass. No definite findings of metastatic disease, noting significant limitations. Assuming this patient has rectal adenocarcinoma, for more accurate staging, consider follow-up CT abdomen/pelvis with contrast in 6 weeks. Electronically Signed   By: SrJulian Hy.D.   On: 04/08/2021 20:03  DG Chest Port 1 View  Result Date: 04/01/2021 CLINICAL DATA:  70 year old male with history of shortness of breath. EXAM: PORTABLE CHEST 1 VIEW COMPARISON:  Chest x-ray 03/20/2021. FINDINGS: Persistent consolidation in the inferior aspect of the right upper lobe. Patchy areas of interstitial prominence and several ill-defined opacities are also now noted elsewhere throughout the lungs bilaterally. Small right pleural effusion. No left pleural effusion. No pneumothorax. No cephalization of the pulmonary vasculature. Heart size is mildly enlarged. The patient is rotated to the right on today's exam, resulting in distortion of the mediastinal contours and reduced diagnostic sensitivity and specificity for mediastinal pathology. Atherosclerotic calcifications in the thoracic aorta. IMPRESSION: 1. Slight worsening consolidation in  the right upper lobe, compatible with pneumonia. There is also evidence to suggest developing multilobar bilateral bronchopneumonia, as above. 2. Cardiomegaly. 3. Aortic atherosclerosis. Electronically Signed   By: Vinnie Langton M.D.   On: 04/01/2021 06:25   DG CHEST PORT 1 VIEW  Result Date: 03/19/2021 CLINICAL DATA:  Dyspnea. EXAM: PORTABLE CHEST 1 VIEW COMPARISON:  03/17/2021 FINDINGS: AP view of the chest again demonstrates airspace disease and consolidation in the mid right lung. There appears to be a small amount of pleural fluid particularly on the right side. Hazy densities in the left lung may represent a small amount of left lung airspace disease. Heart size is enlarged but stable. Atherosclerotic calcifications at the aortic arch. Trachea is midline. Negative for a pneumothorax. IMPRESSION: 1. No significant change in the airspace disease and consolidation in the right lung. Suspect a small amount of pleural fluid. 2. Probable mild airspace disease in left lung. Electronically Signed   By: Markus Daft M.D.   On: 03/19/2021 08:54   DG Chest Port 1 View  Result Date: 03/17/2021 CLINICAL DATA:  Hypoxic respiratory failure. EXAM: PORTABLE CHEST 1 VIEW COMPARISON:  03/16/2021 FINDINGS: Normal scratch set stable cardiomediastinal contours. No pleural effusion identified. There is diffuse pulmonary vascular congestion. Dense airspace consolidation within the right upper lobe is unchanged. Mild patchy airspace densities are noted within the right lower lobe, left midlung and left base, also unchanged. IMPRESSION: 1. No change in aeration to the lungs compared with prior exam. 2. Stable pulmonary vascular congestion. Electronically Signed   By: Kerby Moors M.D.   On: 03/17/2021 08:10   DG Chest Port 1 View  Result Date: 03/16/2021 CLINICAL DATA:  Dyspnea. EXAM: PORTABLE CHEST 1 VIEW COMPARISON:  March 05, 2021 FINDINGS: Marked severity infiltrate is seen within the mid right lung with moderate  severity infiltrate noted along the right lung base. This represents a new finding when compared to the prior study. There is no evidence of a pleural effusion or pneumothorax. The heart size and mediastinal contours are within normal limits. Marked severity calcification of the thoracic aorta is seen. Multilevel degenerative changes are noted throughout the thoracic spine. IMPRESSION: Moderate to marked severity infiltrates within the mid right lung and right lung base. Electronically Signed   By: Virgina Norfolk M.D.   On: 03/16/2021 20:57   DG Swallowing Func-Speech Pathology  Result Date: 03/19/2021 Table formatting from the original result was not included. Objective Swallowing Evaluation: Type of Study: MBS-Modified Barium Swallow Study  Patient Details Name: KEMONTE ULLMAN MRN: [id number] Date of Birth: 01/16/1951 Today's Date: 03/19/2021 Time: SLP Start Time (ACUTE ONLY): 1350 -SLP Stop Time (ACUTE ONLY): 1407 SLP Time Calculation (min) (ACUTE ONLY): 17 min Past Medical History: Past Medical History: Diagnosis Date  Altered mental status   a.  05/2017 - adm with blurred vision, somnolence in setting of AKI and high blood sugar.  Anemia   CKD (chronic kidney disease), stage III (HCC)   Diabetes mellitus   Diastolic CHF, chronic ([place])   A.  03/2009 Echo: EF 60-65%, Gr II diast dysfxn  Elevated troponin   a. 2013 - troponin 1.4. b. 2019 - troponin 0.32; neg nuc 07/2017.  Glaucoma   Hyperlipidemia   HYPERCHOLESTEROLEMIA  Hypertension   MARKED LEFT VENTRICULAR HYPERTROPHY BY PREVIOUS ECHOCARDIOGRAM--HE HAS HYPERDYNAMIC LEFT VENTRICULAR SYSTOLIC FUNCTION AND HAS IMPAIRED RELAXATION BY ECHO  Hypertrophic cardiomyopathy (HCC)   NSVT (nonsustained ventricular tachycardia) (HCC)   Premature atrial contractions   PVC's (premature ventricular contractions)   SVT (supraventricular tachycardia) ([place])  Past Surgical History: Past Surgical History: Procedure Laterality Date  BIOPSY  03/03/2021  Procedure: BIOPSY;  Surgeon:  Jackquline Denmark, MD;  Location: Home;  Service: Endoscopy;;  BRONCHIAL BIOPSY  03/05/2021  Procedure: BRONCHIAL BIOPSIES;  Surgeon: Candee Furbish, MD;  Location: [facility] ENDOSCOPY;  Service: Pulmonary;;  BRONCHIAL BRUSHINGS  03/05/2021  Procedure: BRONCHIAL BRUSHINGS;  Surgeon: Candee Furbish, MD;  Location: [facility] ENDOSCOPY;  Service: Pulmonary;;  BRONCHIAL WASHINGS  03/05/2021  Procedure: BRONCHIAL WASHINGS;  Surgeon: Candee Furbish, MD;  Location: [facility] ENDOSCOPY;  Service: Pulmonary;;  ESOPHAGOGASTRODUODENOSCOPY (EGD) WITH PROPOFOL N/A 03/03/2021  Procedure: ESOPHAGOGASTRODUODENOSCOPY (EGD) WITH PROPOFOL;  Surgeon: Jackquline Denmark, MD;  Location: [facility] ENDOSCOPY;  Service: Endoscopy;  Laterality: N/A;  HEMOSTASIS CONTROL  03/05/2021  Procedure: HEMOSTASIS CONTROL;  Surgeon: Candee Furbish, MD;  Location: [facility] ENDOSCOPY;  Service: Pulmonary;;  NO PAST SURGERIES    VIDEO BRONCHOSCOPY N/A 03/05/2021  Procedure: VIDEO BRONCHOSCOPY WITH FLUORO;  Surgeon: Candee Furbish, MD;  Location: [facility] ENDOSCOPY;  Service: Pulmonary;  Laterality: N/A; HPI: This 70 y.o. African American male nonsmoker presented to the [facility] Emergency Department via EMS with complaints of respiratory distress.  Per EMS, the patient was found to be hypoxic.  SpO2 73% despite non-rebreather mask. He was reportedly placed on CPAP and subsequently became unresponsive.  He was supported with bag-mask ventilation and transported to the ER.  In the ER, the patient has been placed on BiPAP.Of note, the patient was recently hospitalized for pneumonia and was discharged on 9/17 and was treated with cefepime/vancomycin which was switched to Unasyn.  He underwent bronch/BAL/TBBx/brushings 03/05/21. All unrevealing but no BAL cell count/diff obtained. MD concerned about potential aspiration as patient has had recurrent PNA's recently.  Subjective: pleasant, sitting EOB eating dinner Assessment / Plan / Recommendation CHL IP CLINICAL IMPRESSIONS  03/19/2021 Clinical Impression Pt presents with pharyngeal dysphagia characterized by reduced anterior laryngeal movement and a pharyngeal delay which resulted in penetration (PAS 3, 5) of thin and nectar thick liquids. Prompted coughing was ineffective in expelling penetrate. Various postural modifications were attempted and a chin tuck posture with right head turn proved most effective eliminating laryngeal invasion. Mild pyriform sinus residue was noted with liquids and this was cleared with pt's independent use of secondary swallows. Pt's pharyngeal timing and clearance were adequate for solids. A regular texture diet with thin liquids is recommended at this time with observance of swallowing precautions to reduce aspiration risk. SLP will follow for treatment. SLP Visit Diagnosis Dysphagia, pharyngeal phase (R13.13) Attention and concentration deficit following -- Frontal lobe and executive function deficit following -- Impact on safety and function Mild aspiration risk   CHL IP TREATMENT RECOMMENDATION 03/19/2021 Treatment Recommendations Therapy as outlined in treatment plan below  Prognosis 03/19/2021 Prognosis for Safe Diet Advancement Good Barriers to Reach Goals -- Barriers/Prognosis Comment -- CHL IP DIET RECOMMENDATION 03/19/2021 SLP Diet Recommendations Regular solids;Nectar thick liquid Liquid Administration via Cup;No straw Medication Administration Whole meds with liquid Compensations Slow rate;Small sips/bites;Chin tuck with right head turn, secondary swallows Postural Changes Seated upright at 90 degrees   CHL IP OTHER RECOMMENDATIONS 03/19/2021 Recommended Consults -- Oral Care Recommendations Oral care BID Other Recommendations --   CHL IP FOLLOW UP RECOMMENDATIONS 03/17/2021 Follow up Recommendations None   CHL IP FREQUENCY AND DURATION 03/19/2021 Speech Therapy Frequency (ACUTE ONLY) min 2x/week Treatment Duration 2 weeks      CHL IP ORAL PHASE 03/19/2021 Oral Phase WFL Oral - Pudding Teaspoon --  Oral - Pudding Cup -- Oral - Honey Teaspoon -- Oral - Honey Cup -- Oral - Nectar Teaspoon -- Oral - Nectar Cup -- Oral - Nectar Straw -- Oral - Thin Teaspoon -- Oral - Thin Cup -- Oral - Thin Straw -- Oral - Puree -- Oral - Mech Soft -- Oral - Regular -- Oral - Multi-Consistency -- Oral - Pill -- Oral Phase - Comment --  CHL IP PHARYNGEAL PHASE 03/19/2021 Pharyngeal Phase Impaired Pharyngeal- Pudding Teaspoon -- Pharyngeal -- Pharyngeal- Pudding Cup -- Pharyngeal -- Pharyngeal- Honey Teaspoon -- Pharyngeal -- Pharyngeal- Honey Cup -- Pharyngeal -- Pharyngeal- Nectar Teaspoon -- Pharyngeal -- Pharyngeal- Nectar Cup Delayed swallow initiation-vallecula;Delayed swallow initiation-pyriform sinuses;Penetration/Aspiration during swallow;Reduced anterior laryngeal mobility;Pharyngeal residue - pyriform Pharyngeal Material enters airway, CONTACTS cords and not ejected out Pharyngeal- Nectar Straw -- Pharyngeal -- Pharyngeal- Thin Teaspoon -- Pharyngeal -- Pharyngeal- Thin Cup Delayed swallow initiation-vallecula;Delayed swallow initiation-pyriform sinuses;Penetration/Aspiration during swallow;Reduced anterior laryngeal mobility;Pharyngeal residue - pyriform Pharyngeal Material enters airway, remains ABOVE vocal cords and not ejected out;Material enters airway, CONTACTS cords and not ejected out Pharyngeal- Thin Straw -- Pharyngeal -- Pharyngeal- Puree Reduced anterior laryngeal mobility Pharyngeal -- Pharyngeal- Mechanical Soft -- Pharyngeal -- Pharyngeal- Regular Reduced anterior laryngeal mobility Pharyngeal -- Pharyngeal- Multi-consistency -- Pharyngeal -- Pharyngeal- Pill Reduced anterior laryngeal mobility Pharyngeal -- Pharyngeal Comment --  CHL IP CERVICAL ESOPHAGEAL PHASE 03/19/2021 Cervical Esophageal Phase WFL Pudding Teaspoon -- Pudding Cup -- Honey Teaspoon -- Honey Cup -- Nectar Teaspoon -- Nectar Cup -- Nectar Straw -- Thin Teaspoon -- Thin Cup -- Thin Straw -- Puree -- Mechanical Soft -- Regular --  Multi-consistency -- Pill -- Cervical Esophageal Comment -- Shanika I. Hardin Negus, [place], [place] Office number [phone] Pager [phone] Horton Marshall 03/19/2021, 3:49 PM              ECHOCARDIOGRAM LIMITED BUBBLE STUDY  Result Date: 03/19/2021    ECHOCARDIOGRAM LIMITED REPORT   Patient Name:   EMRE STOCK Date of Exam: 03/19/2021 Medical Rec #:  [id number]     Height:       68.0 in Accession #:    [id number]    Weight:       165.1 lb Date of Birth:  [birth year]      BSA:          1.884 m Patient Age:    51 years      BP:           198/81 mmHg Patient Gender: M             HR:           69 bpm. Exam Location:  Inpatient Procedure: Limited Echo Indications:    Hypoxia [300808]  History:  Patient has prior history of Echocardiogram examinations, most                 recent 01/30/2021. CHF and Hypertrophic Cardiomyopathy; Risk                 Factors:Diabetes, Dyslipidemia and Hypertension.  Sonographer:    Bernadene Person RDCS Referring Phys: [id number] [place]  1. Limited echo for shunting in the setting of hypoxia  2. Agitated saline contrast bubble study was positive with shunting observed after >6 cardiac cycles suggestive of intrapulmonary shunting. A moderate number of microbubbles were noted, consistent with a more significant shunt.  3. Left ventricular ejection fraction, by estimation, is 65 to 70%. The left ventricle has normal function. Comparison(s): Changes from prior study are noted. 01/30/2021: LVEF 70-75%. FINDINGS  Left Ventricle: Left ventricular ejection fraction, by estimation, is 65 to 70%. The left ventricle has normal function. IAS/Shunts: No atrial level shunt detected by color flow Doppler. Agitated saline contrast was given intravenously to evaluate for intracardiac shunting. Agitated saline contrast bubble study was positive with shunting observed after >6 cardiac cycles suggestive of intrapulmonary shunting. Lyman Bishop MD  Electronically signed by Lyman Bishop MD Signature Date/Time: 03/19/2021/3:31:33 PM    Final    VAS [place] LOWER EXTREMITY VENOUS (DVT)  Result Date: 03/17/2021  Lower Venous DVT Study Patient Name:  QUENCY TOBER  Date of Exam:   03/17/2021 Medical Rec #: [id number]      Accession #:    [id number] Date of Birth: 09-Jan-1951       Patient Gender: M Patient Age:   69 years Exam Location:  [facility] Procedure:      VAS [place] LOWER EXTREMITY VENOUS (DVT) Referring Phys: Renee Pain --------------------------------------------------------------------------------  Indications: Edema.  Limitations: Acoustic shadowing due to overlying calcification. Comparison Study: 01-14-2020 Bilateral lower extremity venous study was negative                   for DVT.                    01-08-2021 ABI w/ TBI showed bilateral absent DPA, moderate                   right arterial disease, mild left arterial disease. Performing Technologist: Darlin Coco RDMS, RVT  Examination Guidelines: A complete evaluation includes B-mode imaging, spectral Doppler, color Doppler, and power Doppler as needed of all accessible portions of each vessel. Bilateral testing is considered an integral part of a complete examination. Limited examinations for reoccurring indications may be performed as noted. The reflux portion of the exam is performed with the patient in reverse Trendelenburg.  +---------+---------------+---------+-----------+----------+--------------+ RIGHT    CompressibilityPhasicitySpontaneityPropertiesThrombus Aging +---------+---------------+---------+-----------+----------+--------------+ CFV      Full           Yes      Yes                                 +---------+---------------+---------+-----------+----------+--------------+ SFJ      Full                                                        +---------+---------------+---------+-----------+----------+--------------+ FV Prox  Full                                                         +---------+---------------+---------+-----------+----------+--------------+  FV Mid   Full                                                        +---------+---------------+---------+-----------+----------+--------------+ FV DistalFull                                                        +---------+---------------+---------+-----------+----------+--------------+ PFV      Full                                                        +---------+---------------+---------+-----------+----------+--------------+ POP      Full           Yes      Yes                                 +---------+---------------+---------+-----------+----------+--------------+ PTV      Full                                                        +---------+---------------+---------+-----------+----------+--------------+ PERO     Full                                                        +---------+---------------+---------+-----------+----------+--------------+ Gastroc  Full                                                        +---------+---------------+---------+-----------+----------+--------------+   +---------+---------------+---------+-----------+----------+--------------+ LEFT     CompressibilityPhasicitySpontaneityPropertiesThrombus Aging +---------+---------------+---------+-----------+----------+--------------+ CFV      Full           Yes      Yes                                 +---------+---------------+---------+-----------+----------+--------------+ SFJ      Full                                                        +---------+---------------+---------+-----------+----------+--------------+ FV Prox  Full                                                        +---------+---------------+---------+-----------+----------+--------------+  FV Mid   Full                                                         +---------+---------------+---------+-----------+----------+--------------+ FV DistalFull                                                        +---------+---------------+---------+-----------+----------+--------------+ PFV      Full                                                        +---------+---------------+---------+-----------+----------+--------------+ POP      Full           Yes      Yes                                 +---------+---------------+---------+-----------+----------+--------------+ PTV      Full                                                        +---------+---------------+---------+-----------+----------+--------------+ PERO     Full                                                        +---------+---------------+---------+-----------+----------+--------------+ Gastroc  Full                                                        +---------+---------------+---------+-----------+----------+--------------+     Summary: RIGHT: - There is no evidence of deep vein thrombosis in the lower extremity.  - No cystic structure found in the popliteal fossa.  LEFT: - There is no evidence of deep vein thrombosis in the lower extremity.  - No cystic structure found in the popliteal fossa.  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 *See table(s) above for measurements and observations. Electronically signed by Servando Snare MD on 03/17/2021 at 5:23:46 PM.    Final     Pathology:  SURGICAL PATHOLOGY  CASE: [phone]  PATIENT: Arie Sabina  Surgical Pathology Report   Clinical History: colonoscopy (cm)   FINAL MICROSCOPIC DIAGNOSIS:   A. COLON, ASCENDING AND CECUM, POLYPECTOMY:  - Tubular adenoma (s).  - No high-grade dysplasia or carcinoma.  - Inflammatory polyp with erosion.   B. COLON, DESCENDING, POLYPECTOMY:  - Tubular adenoma (s).  - No high-grade dysplasia or carcinoma.   C. RECTUM, MASS, BIOPSY:  - Invasive  moderately differentiated colorectal adenocarcinoma.  - See comment.  COMMENT:  C.  Dr. Vic Ripper agrees.   Called to Dr. Lorenso Courier on 04/09/2021.   GROSS DESCRIPTION:  A: Received in formalin are tan, soft tissue fragments that are  submitted in toto. Number: Multiple size: Range from 0.2 to 1.3 x 0.4 x  0.2 cm blocks: 2   B: Received in formalin are tan, soft tissue fragments that are  submitted in toto. Number: Multiple size: Range from 0.4 cm to 1.4 x 0.4  x 0.4 cm blocks: 2   C: Received in formalin are tan, soft tissue fragments that are  submitted in toto. Number: Multiple size: Range from 0.1 to 0.5 cm  blocks: 1 Craig Staggers 04/06/2021)   Final Diagnosis performed by Claudette Laws, MD.  Assessment and Plan:  70 year old male with multiple medical comorbidities as noted above stage IV CKD, diabetes mellitus, chronic diastolic CHF, chronic hypoxic respiratory failure on 4 L of O2 baseline, hypertension, hyperlipidemia with    1) Newly diagnosed rectal moderately differentiated adenocarcinoma with lower GI bleeding and severe iron deficiency anemia  2) poor functional status ECOG of 3-4  3) Anemia-multifactorial.  Primarily due to iron deficiency from GI bleeding from rectal cancer but also anemia secondary to his stage IV chronic kidney disease.  4) limiting shortness of breath -multifactorial Patient with chronic respiratory failure due to COPD on 4 L of oxygen by nasal cannula HOCM with acute on chronic CHF -currently in decompensated CHF. Chronic kidney disease stage IV with fluid overload. Pulmonary right-to-left shunt. Possible multifocal pneumonia  5) Helicobacter pylori positive gastritis 03/03/2021  6) diabetes type 2  PLAN -Met with the patient and discussed his current clinical status in details. -Discussed his new diagnosis of rectal moderately differentiated adenocarcinoma.  MRI of the abdomen and pelvis did not show overt pelvic adenopathy or liver metastases and  CT of the chest does not show any obvious metastatic disease. There was however significant motion artifact on his MRI of the abdomen and pelvis to rule out any adenopathy. -Based on imaging this will be probably T3 lesion, cN 0 cM0-stage IIA. -Given his extensive medical comorbidities and decompensated cardiorespiratory status patient is not currently a candidate for surgery. -We will recommend consulting radiation oncology for radiation to try to address his ongoing GI bleeding. -Patient not a good candidate for concurrent chemotherapy.  Cannot use Xeloda due to his significant kidney problems. -Cannot safely use 5-fluorouracil concurrent with radiation due to his significant cardiac comorbidities which are not stable at this time. -Ongoing goals of care discussion. -Patient understands the complex nature of his medical issues and limitations in his treatment. -Consider IV Feraheme 510 mg x 1 if IV iron not received already. -Transfuse as needed for hemoglobin less than 7.5 -Cardiorespiratory management as per hospital medicine. -He requested to be also involved his brother Ransford Flath _0 .   Thank you for this referral.   . The total time spent in the appointment was 80 minutes and more than 50% was on counseling and direct patient cares, coordination of care with hospital medicine.  Sullivan Lone MD [place] AAHIVMS [facility] Hematology/Oncology Physician [place]

## 2021-04-09 NOTE — Plan of Care (Signed)
Pt had one BM overnight that was all clear watery liquid with some red tinged mucous in the watery stool. Red tinged mucous not in significant amount.

## 2021-04-09 NOTE — Progress Notes (Signed)
Just received a call from pathology, Dr. Saralyn Pilar.  Rectal mass positive for adenocarcinoma. Will notify [facility].

## 2021-04-09 NOTE — Care Management Important Message (Signed)
Important Message  Patient Details  Name: Robert Chaney MRN: [id number] Date of Birth: 30-Jan-1951   Medicare Important Message Given:  Yes     [name] 04/09/2021, 10:26 AM

## 2021-04-09 NOTE — Progress Notes (Signed)
PROGRESS NOTE    Robert Chaney  MVE:[id number] DOB: [birth year] DOA: 04/01/2021 PCP: [name], MD   Chief Complaint  Patient presents with   Shortness of Breath    Brief Narrative:  70 year old gentleman prior history of stage IV CKD, diabetes, chronic diastolic heart failure, chronic hypoxic respiratory failure on 4 L of nasal cannula oxygen at home, hypertension and hyperlipidemia, recently diagnosed chronic gastritis from an EGD on 03/03/2021 with H pylori presents to ED with sudden onset of chest pain and shortness of breath requiring BiPAP and currently is on 4-6 L of nasal cannula oxygen.  Patient was started on nitroglycerin drip and was weaned off . Cardiology consulted, suggested better blood pressure control and wean nitroglycerin.  Ultimately underwent colonoscopy and found to have fungating ulcerative mass on the rectum.  Needing frequent blood transfusions.  Received total 3 units of PRBC on this hospitalization.  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  CT scan of the chest and MRI with contrast abdomen pelvis with no evidence of distant metastasis.   Assessment & Plan:   Principal Problem:   Acute on chronic diastolic CHF (congestive heart failure) (HCC) Active Problems:   DM (diabetes mellitus) with complications (HCC)   Stage 4 chronic kidney disease (HCC)   Hyperkalemia   Dyslipidemia   Hypertensive crisis   Multifocal pneumonia   Malnutrition of moderate degree   Rectal bleeding   Acute on chronic respiratory failure with hypoxia secondary to combination of acute on chronic diastolic heart failure and hypertensive urgency and healthcare associated pneumonia: His shortness of breath has improved. He initially required BiPAP with improvement in his breathing and currently is on 4 L of nasal cannula oxygen to keep sats greater than 90%. CT chest 10/15 with right upper lobe opacity.  Completed course of antibiotics with vancomycin and  cefepime.  Pulmonary symptoms improved. Cultures negative for Streptococcus and Legionella. Respiratory panel by RT-PCR is negative for flu and COVID-19 Patient remains afebrile and WBC count within normal limits. Procalcitonin level 0.26.   Patient is on triple antibiotic therapy for H. pylori eradication, no indication for further antibiotics.  Atypical chest pain: Had episode of chest pain. No EKG changes.  Improved.  Troponins mildly elevated but less than previous.  Symptomatic treatment.  acute on chronic diastolic heart failure Compensated.  We will continue Lasix 40 mg daily today.  Takes 40 mg twice a day at home.  Anemia of chronic disease/?  Anemia of blood loss Baseline hemoglobin around 8, Received total 3 units of PRBC.  Recent upper GI endoscopy with gastritis positive for H. pylori. Colonoscopy 10/14 with multiple sessile polyps and ulcerating fungating mass in the rectum. Biopsy consistent with invasive and moderately differentiated colorectal adenocarcinoma. CT chest with no evidence of obvious metastatic lesion.  MRI of the abdomen and pelvis with and without contrast with no evidence of distant metastasis. Patient has multiple comorbidities, I am not sure he can tolerate any surgical interventions or chemotherapy.  May benefit with radiation treatment. Case discussed with oncology , will be seen in consultation today.  Acute on Stage IV CKD Creatinine at 2.3 on admission , increased to 3.4 today.  Euvolemic. Will closely monitor.  Potassium is adequate.  He does follow-up with nephrology as outpatient.  Elevated troponins felt to be from demand ischemia in the setting of hypertensive crisis and worsening anemia. IV heparin is discontinued.  Hypertensive urgency Patient was initially started on nitroglycerin gtt and has been weaned off. Blood  pressure parameters have improved Patient currently on hydralazine 100 mg 3 times daily, Coreg 25 mg twice daily, amlodipine  10 mg daily.  Hyperlipidemia Continue with Lipitor 20 mg daily.  Chronic constipation Improved with bowel prep.   Diabetes mellitus with hyperglycemia Insulin-dependent. CBG (last 3)  Recent Labs    04/08/21 1552 04/08/21 2037 04/09/21 0613  GLUCAP 88 194* 75   Decreased the semglee to 6 units daily.  Continue with SSI.   Body mass index is 25.51 kg/m. Recommend outpatient follow up with PCP.     Moderate malnutrition: Dietary on board.   Continue to mobilize.  Patient is medically stabilizing.  Found to have rectal cancer in a patient with multiple comorbidities.  Will discuss with oncology plan before discharging him home.   DVT prophylaxis: SCDs Code Status: DNR Family Communication: None. disposition: Remains inpatient.  Status is: Inpatient  Remains inpatient appropriate because:Ongoing diagnostic testing needed not appropriate for outpatient work up, Unsafe d/c plan, and IV treatments appropriate due to intensity of illness or inability to take PO  Dispo: The patient is from: Home              Anticipated d/c is to: Home              Patient currently is not medically stable to d/c.   Difficult to place patient No       Consultants:  Cardiology Gastroenterology Palliative.  Procedures: None Antimicrobials: Flagyl and tetracycline  Subjective: Patient seen and examined.  Denies any complaints today.  He feels comfortable going home once we have a plan in place.  Objective: Vitals:   04/09/21 0255 04/09/21 0303 04/09/21 0857 04/09/21 0907  BP:  (!) 144/69 (!) 186/61 119/86  Pulse:   68 69  Resp:  19  20  Temp:  (!) 97 F (36.1 C)  98.2 F (36.8 C)  TempSrc:  Oral  Oral  SpO2:  92%  97%  Weight: 76.1 kg     Height: 5\' 8"  (1.727 m)       Intake/Output Summary (Last 24 hours) at 04/09/2021 1026 Last data filed at 04/09/2021 0426 Gross per 24 hour  Intake 177 ml  Output 550 ml  Net -373 ml   Filed Weights   04/06/21 0842 04/07/21  0616 04/09/21 0255  Weight: 72.8 kg 73.9 kg 76.1 kg    Examination:  General: Frail debilitated and chronically sick looking but not in any distress. Looks fairly comfortable.  Sitting at the edge of the bed.  On 5 L oxygen. SpO2: 97 % O2 Flow Rate (L/min): 5 L/min FiO2 (%): 40 %  Cardiovascula S1-S2 normal.  No added sounds.    Respiratory: Bilateral clear.  On 5 L oxygen. Gastrointestinal: Soft and nontender.  Bowel sounds present. Ext: No swelling or edema.  No cyanosis.    Data Reviewed: I have personally reviewed following labs and imaging studies  CBC: Recent Labs  Lab 04/02/21 1428 04/03/21 0148 04/05/21 0201 04/06/21 0332 04/07/21 0220 04/07/21 1932 04/08/21 0338 04/09/21 0417  WBC 6.1   < > 3.8* 3.6* 4.8  --  5.6 4.4  NEUTROABS 4.8  --   --   --   --   --   --   --   HGB 7.7*   < > 7.4* 7.3* 6.9* 9.5* 9.3* 9.0*  HCT 26.2*   < > 24.5* 24.6* 23.0* 30.5* 30.5* 29.1*  MCV 96.7   < > 94.2 95.0 94.7  --  93.6 94.8  PLT 261   < > 211 203 201  --  201 174   < > = values in this interval not displayed.    Basic Metabolic Panel: Recent Labs  Lab 04/03/21 0148 04/04/21 0311 04/05/21 0201 04/06/21 0332 04/09/21 0417  NA 142 138 143 141 137  K 5.4* 5.1 4.1 3.9 4.6  CL 110 104 110 109 109  CO2 27 28 25 24 23   GLUCOSE 98 128* 147* 82 95  BUN 66* 76* 64* 52* 58*  CREATININE 2.88* 3.10* 2.76* 2.81* 3.40*  CALCIUM 8.6* 8.6* 8.5* 8.7* 8.8*  MG  --   --   --   --  2.3    GFR: Estimated Creatinine Clearance: 19 mL/min (A) (by C-G formula based on SCr of 3.4 mg/dL (H)).  Liver Function Tests: No results for input(s): AST, ALT, ALKPHOS, BILITOT, PROT, ALBUMIN in the last 168 hours.  CBG: Recent Labs  Lab 04/08/21 0615 04/08/21 1115 04/08/21 1552 04/08/21 2037 04/09/21 0613  GLUCAP 118* 196* 88 194* 75     Recent Results (from the past 240 hour(s))  Resp Panel by RT-PCR (Flu A&B, Covid) Nasopharyngeal Swab     Status: None   Collection Time: 04/01/21   6:13 AM   Specimen: Nasopharyngeal Swab; Nasopharyngeal(NP) swabs in vial transport medium  Result Value Ref Range Status   SARS Coronavirus 2 by RT PCR NEGATIVE NEGATIVE Final    Comment: (NOTE) SARS-CoV-2 target nucleic acids are NOT DETECTED.  The SARS-CoV-2 RNA is generally detectable in upper respiratory specimens during the acute phase of infection. The lowest concentration of SARS-CoV-2 viral copies this assay can detect is 138 copies/mL. A negative result does not preclude SARS-Cov-2 infection and should not be used as the sole basis for treatment or other patient management decisions. A negative result may occur with  improper specimen collection/handling, submission of specimen other than nasopharyngeal swab, presence of viral mutation(s) within the areas targeted by this assay, and inadequate number of viral copies(<138 copies/mL). A negative result must be combined with clinical observations, patient history, and epidemiological information. The expected result is Negative.  Fact Sheet for Patients:  EntrepreneurPulse.com.au  Fact Sheet for Healthcare Providers:  IncredibleEmployment.be  This test is no t yet approved or cleared by the [place] FDA and  has been authorized for detection and/or diagnosis of SARS-CoV-2 by FDA under an Emergency Use Authorization (EUA). This EUA will remain  in effect (meaning this test can be used) for the duration of the COVID-19 declaration under Section 564(b)(1) of the Act, 21 U.S.C.section 360bbb-3(b)(1), unless the authorization is terminated  or revoked sooner.       Influenza A by PCR NEGATIVE NEGATIVE Final   Influenza B by PCR NEGATIVE NEGATIVE Final    Comment: (NOTE) The Xpert Xpress SARS-CoV-2/FLU/RSV plus assay is intended as an aid in the diagnosis of influenza from Nasopharyngeal swab specimens and should not be used as a sole basis for treatment. Nasal washings and aspirates  are unacceptable for Xpert Xpress SARS-CoV-2/FLU/RSV testing.  Fact Sheet for Patients: EntrepreneurPulse.com.au  Fact Sheet for Healthcare Providers: IncredibleEmployment.be  This test is not yet approved or cleared by the [place] FDA and has been authorized for detection and/or diagnosis of SARS-CoV-2 by FDA under an Emergency Use Authorization (EUA). This EUA will remain in effect (meaning this test can be used) for the duration of the COVID-19 declaration under Section 564(b)(1) of the Act, 21 U.S.C. section 360bbb-3(b)(1), unless the authorization  is terminated or revoked.  Performed at [facility], [place] [address], [place], [place] [address]   Culture, blood (Routine X 2) w Reflex to ID Panel     Status: None   Collection Time: 04/01/21 12:05 PM   Specimen: BLOOD RIGHT HAND  Result Value Ref Range Status   Specimen Description BLOOD RIGHT HAND  Final   Special Requests   Final    BOTTLES DRAWN AEROBIC AND ANAEROBIC Blood Culture results may not be optimal due to an inadequate volume of blood received in culture bottles   Culture   Final    NO GROWTH 5 DAYS Performed at [facility], [place] [address], [place], [place] [address]    Report Status 04/06/2021 FINAL  Final  Culture, blood (Routine X 2) w Reflex to ID Panel     Status: None   Collection Time: 04/01/21 12:06 PM   Specimen: BLOOD RIGHT HAND  Result Value Ref Range Status   Specimen Description BLOOD RIGHT HAND  Final   Special Requests   Final    BOTTLES DRAWN AEROBIC ONLY Blood Culture results may not be optimal due to an inadequate volume of blood received in culture bottles   Culture   Final    NO GROWTH 5 DAYS Performed at [facility], [place] [address], [place], [place] [address]    Report Status 04/06/2021 FINAL  Final  MRSA Next Gen by PCR, Nasal     Status: None   Collection Time: 04/01/21 12:49 PM   Specimen: Nasal Mucosa; Nasal Swab  Result  Value Ref Range Status   MRSA by PCR Next Gen NOT DETECTED NOT DETECTED Final    Comment: (NOTE) The GeneXpert MRSA Assay (FDA approved for NASAL specimens only), is one component of a comprehensive MRSA colonization surveillance program. It is not intended to diagnose MRSA infection nor to guide or monitor treatment for MRSA infections. Test performance is not FDA approved in patients less than 65 years old. Performed at [facility], [place] [address], [place], [place] [address]          Radiology Studies: CT CHEST WO CONTRAST  Result Date: 04/07/2021 CLINICAL DATA:  GI cancer, for staging EXAM: CT CHEST WITHOUT CONTRAST TECHNIQUE: Multidetector CT imaging of the chest was performed following the standard protocol without IV contrast. COMPARISON:  03/07/2021 FINDINGS: Cardiovascular: Mild cardiomegaly. No pericardial effusion. Hypodense blood pool relative to myocardium, suggesting anemia. No evidence of thoracic aortic aneurysm. Atherosclerotic calcifications of the aortic arch. Mild coronary atherosclerosis of the LAD and right coronary artery. Mediastinum/Nodes: No suspicious mediastinal lymphadenopathy. Visualized thyroid is unremarkable. Lungs/Pleura: Patchy opacity in the posterior right upper lobe (series 8/image 89), suggesting pneumonia. Moderate right and small left pleural effusions. Mild interlobular septal thickening in the upper lobes suggests very mild superimposed edema is possible. Mild bilateral lower lobe compressive atelectasis. 4 mm ground-glass nodule in the anterior right upper lobe (series 8/image 83), possibly related to the suspected edema, but warranting attention on follow-up. No pneumothorax. Upper Abdomen: Visualized upper abdomen is grossly unremarkable, noting vascular calcifications. Musculoskeletal: Degenerative changes of the visualized thoracolumbar spine. IMPRESSION: Right upper lobe opacity, suspicious for pneumonia. Moderate right and small left  pleural effusions. Suspected mild superimposed pulmonary edema. 4 mm ground-glass nodule in the anterior right upper lobe, possibly related to the suspected edema, but warranting attention on follow-up. Consider follow-up CT chest in 3-6 months. No findings specific for metastatic disease in the chest. Aortic  Atherosclerosis (ICD10-I70.0). Electronically Signed   By: Julian Hy M.D.   On: 04/07/2021 21:12   MR PELVIS W WO CONTRAST  Result Date: 04/08/2021 CLINICAL DATA:  Rectal mass on colonoscopy, suspected rectal cancer, metastatic disease evaluation EXAM: MRI ABDOMEN AND PELVIS WITHOUT AND WITH CONTRAST TECHNIQUE: Multiplanar multisequence MR imaging of the abdomen and pelvis was performed both before and after the administration of intravenous contrast. CONTRAST:  16mL GADAVIST GADOBUTROL 1 MMOL/ML IV SOLN COMPARISON:  None. FINDINGS: Markedly limited evaluation due to motion degradation. Lower chest: Moderate right and small left pleural effusions. Hepatobiliary: No focal hepatic lesion is seen, noting motion degradation. Gallbladder is grossly unremarkable. No intrahepatic or extrahepatic ductal dilatation. Pancreas:  Grossly unremarkable. Spleen:  Grossly unremarkable. Adrenals/Urinary Tract:  Adrenal glands are grossly unremarkable. Subcentimeter bilateral renal cysts.  No hydronephrosis. Trabeculated bladder. Stomach/Bowel: Stomach is grossly unremarkable. No evidence of bowel obstruction. Enhancing masslike concentric thickening involving the lower rectum (series 29/image 30), corresponding to the recently biopsied rectal Mass. Vascular/Lymphatic:  No evidence of aneurysm. Marked limited evaluation for lymph nodes given motion degradation. Reproductive: Prostate is grossly unremarkable. Other:  No abdominopelvic ascites. Musculoskeletal: No focal osseous lesions. IMPRESSION: Markedly limited evaluation due to motion degradation. Enhancing, masslike concentric wall thickening involving the lower  rectum, corresponding to recently biopsied rectal mass. No definite findings of metastatic disease, noting significant limitations. Assuming this patient has rectal adenocarcinoma, for more accurate staging, consider follow-up CT abdomen/pelvis with contrast in 6 weeks. Electronically Signed   By: Julian Hy M.D.   On: 04/08/2021 20:10   MR ABDOMEN W WO CONTRAST  Result Date: 04/08/2021 CLINICAL DATA:  Rectal mass on colonoscopy, suspected rectal cancer, metastatic disease evaluation EXAM: MRI ABDOMEN AND PELVIS WITHOUT AND WITH CONTRAST TECHNIQUE: Multiplanar multisequence MR imaging of the abdomen and pelvis was performed both before and after the administration of intravenous contrast. CONTRAST:  99mL GADAVIST GADOBUTROL 1 MMOL/ML IV SOLN COMPARISON:  None. FINDINGS: Markedly limited evaluation due to motion degradation. Lower chest: Moderate right and small left pleural effusions. Hepatobiliary: No focal hepatic lesion is seen, noting motion degradation. Gallbladder is grossly unremarkable. No intrahepatic or extrahepatic ductal dilatation. Pancreas:  Grossly unremarkable. Spleen:  Grossly unremarkable. Adrenals/Urinary Tract:  Adrenal glands are grossly unremarkable. Subcentimeter bilateral renal cysts.  No hydronephrosis. Trabeculated bladder. Stomach/Bowel: Stomach is grossly unremarkable. No evidence of bowel obstruction. Enhancing masslike concentric thickening involving the lower rectum (series 29/image 30), corresponding to the recently biopsied rectal mass. Vascular/Lymphatic:  No evidence of aneurysm. Markedly limited evaluation for lymph nodes given motion degradation. Reproductive: Prostate is grossly unremarkable. Other:  No abdominopelvic ascites. Musculoskeletal: No focal osseous lesions. IMPRESSION: Markedly limited evaluation due to motion degradation. Enhancing, masslike concentric wall thickening involving the lower rectum, corresponding to recently biopsied rectal mass. No definite  findings of metastatic disease, noting significant limitations. Assuming this patient has rectal adenocarcinoma, for more accurate staging, consider follow-up CT abdomen/pelvis with contrast in 6 weeks. Electronically Signed   By: Julian Hy M.D.   On: 04/08/2021 20:03        Scheduled Meds:  sodium chloride   Intravenous Once   amLODipine  10 mg Oral Daily   atorvastatin  20 mg Oral Daily   bismuth subsalicylate  197 mg Oral TID AC & HS   brimonidine  1 drop Right Eye BID   carvedilol  25 mg Oral BID   docusate sodium  200 mg Oral BID   dorzolamide  1 drop Both Eyes BID  furosemide  40 mg Oral Daily   hydrALAZINE  100 mg Oral TID   insulin aspart  0-9 Units Subcutaneous TID WC   insulin glargine-yfgn  6 Units Subcutaneous Daily   isosorbide mononitrate  60 mg Oral Daily   latanoprost  1 drop Left Eye QHS   metroNIDAZOLE  500 mg Oral TID   multivitamin  1 tablet Oral QHS   pantoprazole  40 mg Oral BID   Ensure Max Protein  11 oz Oral Daily   sodium chloride flush  3 mL Intravenous Q12H   tetracycline  500 mg Oral BID AC   Continuous Infusions:     LOS: 8 days    Total time spent: 30 minutes    Barb Merino, MD [facility]    04/09/2021, 10:26 AM

## 2021-04-09 NOTE — Progress Notes (Signed)
Brief GI Path Note: Rectal biopsies revealed invasive moderately differentiated colorectal adenocarcinoma. Other polyps were tubular adenomas and one inflammatory polyp. Recommend medical oncology and surgical oncology consults. Patient already received imaging for staging. Primary team was notified.

## 2021-04-09 NOTE — Progress Notes (Signed)
PT Cancellation Note  Patient Details Name: Robert Chaney MRN: [id number] DOB: April 01, 1951   Cancelled Treatment:    Reason Eval/Treat Not Completed: Other (comment).  Tired and declined PT politely twice today.  Retry in the AM.   Ramond Dial 04/09/2021, 3:05 PM  Mee Hives, PT PhD Acute Rehab Dept. Number: [place] [facility] [place]

## 2021-04-09 NOTE — Plan of Care (Signed)

## 2021-04-10 ENCOUNTER — Ambulatory Visit: Payer: PPO

## 2021-04-10 ENCOUNTER — Ambulatory Visit
Admit: 2021-04-10 | Discharge: 2021-04-10 | Disposition: A | Discharge status: Home / Self Care | Payer: PPO | Attending: Radiation Oncology | Admitting: Radiation Oncology

## 2021-04-10 DIAGNOSIS — I5033 Acute on chronic diastolic (congestive) heart failure: Secondary | ICD-10-CM | POA: Diagnosis not present

## 2021-04-10 DIAGNOSIS — N184 Chronic kidney disease, stage 4 (severe): Secondary | ICD-10-CM | POA: Diagnosis not present

## 2021-04-10 DIAGNOSIS — C20 Malignant neoplasm of rectum: Secondary | ICD-10-CM

## 2021-04-10 DIAGNOSIS — R531 Weakness: Secondary | ICD-10-CM | POA: Diagnosis not present

## 2021-04-10 DIAGNOSIS — Z515 Encounter for palliative care: Secondary | ICD-10-CM | POA: Diagnosis not present

## 2021-04-10 LAB — CBC
HCT: 31.6 % — ABNORMAL LOW (ref 39.0–52.0)
Hemoglobin: 9.7 g/dL — ABNORMAL LOW (ref 13.0–17.0)
MCH: 29 pg (ref 26.0–34.0)
MCHC: 30.7 g/dL (ref 30.0–36.0)
MCV: 94.3 fL (ref 80.0–100.0)
Platelets: 169 10*3/uL (ref 150–400)
RBC: 3.35 MIL/uL — ABNORMAL LOW (ref 4.22–5.81)
RDW: 15.9 % — ABNORMAL HIGH (ref 11.5–15.5)
WBC: 4.8 10*3/uL (ref 4.0–10.5)
nRBC: 0 % (ref 0.0–0.2)

## 2021-04-10 LAB — BASIC METABOLIC PANEL
Anion gap: 8 (ref 5–15)
BUN: 63 mg/dL — ABNORMAL HIGH (ref 8–23)
CO2: 21 mmol/L — ABNORMAL LOW (ref 22–32)
Calcium: 8.9 mg/dL (ref 8.9–10.3)
Chloride: 110 mmol/L (ref 98–111)
Creatinine, Ser: 3.33 mg/dL — ABNORMAL HIGH (ref 0.61–1.24)
GFR, Estimated: 19 mL/min — ABNORMAL LOW (ref >60)
Glucose, Bld: 74 mg/dL (ref 70–99)
Potassium: 4.7 mmol/L (ref 3.5–5.1)
Sodium: 139 mmol/L (ref 135–145)

## 2021-04-10 LAB — GLUCOSE, CAPILLARY
Glucose-Capillary: 64 mg/dL — ABNORMAL LOW (ref 70–99)
Glucose-Capillary: 86 mg/dL (ref 70–99)
Glucose-Capillary: 180 mg/dL — ABNORMAL HIGH (ref 70–99)
Glucose-Capillary: 145 mg/dL — ABNORMAL HIGH (ref 70–99)
Glucose-Capillary: 148 mg/dL — ABNORMAL HIGH (ref 70–99)

## 2021-04-10 NOTE — Progress Notes (Signed)
Inpatient Diabetes Program Recommendations  AACE/ADA: New Consensus Statement on Inpatient Glycemic Control (2015)  Target Ranges:  Prepandial:   less than 140 mg/dL      Peak postprandial:   less than 180 mg/dL (1-2 hours)      Critically ill patients:  140 - 180 mg/dL   Lab Results  Component Value Date   GLUCAP 180 (H) 04/10/2021   HGBA1C 5.9 (H) 04/03/2021    Review of Glycemic Control Results for QADIR, FOLKS (MRN [id number]) as of 04/10/2021 14:36  Ref. Range 04/09/2021 06:13 04/09/2021 11:06 04/09/2021 15:51 04/09/2021 21:29 04/10/2021 06:12 04/10/2021 06:38 04/10/2021 11:28  Glucose-Capillary Latest Ref Range: 70 - 99 mg/dL 75 195 (H) 108 (H) 207 (H) 64 (L) 86 180 (H)   Diabetes history: DM 2 Outpatient Diabetes medications: Dexcom, Lantus 10 units Daily Current orders for Inpatient glycemic control:  Semglee 6 units Daily Novolog 0-9 units tid   Ensure max Daily  Inpatient Diabetes Program Recommendations:    Hypoglycemia this am. Fasting glucose on the lower side the last 2 mornings.  -  Reduce Semglee to 5 units  Thanks,  Tama Headings RN, MSN, BC-ADM Inpatient Diabetes Coordinator Team Pager [phone] (8a-5p)

## 2021-04-10 NOTE — Progress Notes (Signed)
[facility] has arrived to transport pt, belongings were packed, pt had 1 pair of glasses, cell flip phone, and a wallet placed in belonging bag at pt's request.  Also packed other hospital belongings in bag to go with him.

## 2021-04-10 NOTE — Progress Notes (Signed)
PROGRESS NOTE    Robert Chaney  ZJI:[id number] DOB: 07/23/1950 DOA: 04/01/2021 PCP: [name], MD   Chief Complaint  Patient presents with   Shortness of Breath    Brief Narrative:  70 year old gentleman prior history of stage IV CKD, diabetes, chronic diastolic heart failure, chronic hypoxic respiratory failure on 4 L of nasal cannula oxygen at home, hypertension and hyperlipidemia, recently diagnosed chronic gastritis from an EGD on 03/03/2021 with H pylori presented to ED with sudden onset of chest pain and shortness of breath requiring BiPAP and currently is on 4-6 L of nasal cannula oxygen.  Patient was started on nitroglycerin drip and was weaned off . Cardiology consulted, suggested better blood pressure control and wean nitroglycerin.   Ultimately underwent colonoscopy and found to have fungating ulcerative mass on the rectum.  Needing frequent blood transfusions.  Received total 3 units of PRBC on this hospitalization. 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  CT scan of the chest and MRI with contrast abdomen pelvis with no evidence of distant metastasis. Oncology/radiation oncology consulted, patient is deemed not a surgical candidate.  Transferring to [facility] for inpatient radiation therapy initiation.   Assessment & Plan:   Principal Problem:   Acute on chronic diastolic CHF (congestive heart failure) (HCC) Active Problems:   DM (diabetes mellitus) with complications (HCC)   Stage 4 chronic kidney disease (HCC)   Hyperkalemia   Dyslipidemia   Hypertensive crisis   Multifocal pneumonia   Malnutrition of moderate degree   Rectal bleeding   Rectal adenocarcinoma (HCC)   Acute on chronic respiratory failure with hypoxia secondary to combination of acute on chronic diastolic heart failure and hypertensive urgency and healthcare associated pneumonia:  His shortness of breath has improved. He initially required BiPAP with  improvement in his breathing and currently is on 4 L of nasal cannula oxygen to keep sats greater than 90%. CT chest 10/15 with right upper lobe opacity.  Completed course of antibiotics with vancomycin and cefepime.  Pulmonary symptoms improved. Cultures negative for Streptococcus and Legionella. Respiratory panel by RT-PCR is negative for flu and COVID-19 Patient remains afebrile and WBC count within normal limits. Procalcitonin level 0.26.   Patient is on triple antibiotic therapy for H. pylori eradication.  Atypical chest pain: Had episode of chest pain. No EKG changes.  Improved.  Troponins mildly elevated but less than previous.  Symptomatic treatment.  acute on chronic diastolic heart failure Compensated.  We will continue Lasix 40 mg daily today.  Takes 40 mg twice a day at home.  Anemia of chronic disease/?  Anemia of blood loss Baseline hemoglobin around 8, Received total 3 units of PRBC.  Recent upper GI endoscopy with gastritis positive for H. pylori. Colonoscopy 10/14 with multiple sessile polyps and ulcerating fungating mass in the rectum. Biopsy consistent with invasive and moderately differentiated colorectal adenocarcinoma. CT chest with no evidence of obvious metastatic lesion.  MRI of the abdomen and pelvis with and without contrast with no evidence of distant metastasis. Patient has multiple comorbidities, seen by oncology.  He cannot undergo colorectal surgery due to extensive medical comorbidities.  Deemed not a surgical candidate.   Seen by radiation oncology today, is starting radiation treatment tomorrow at [facility].   Will transfer to [facility] to facilitate inpatient treatment.    Acute on Stage IV CKD Creatinine at 2.3 on admission , increased to 3.4-3.3 today.  Euvolemic. Will closely monitor.  Potassium is adequate.  He does follow-up with  nephrology as outpatient.  Elevated troponins felt to be from demand ischemia in the setting of hypertensive crisis  and worsening anemia. IV heparin is discontinued.  Hypertensive urgency Patient was initially started on nitroglycerin gtt and has been weaned off. Blood pressure parameters have improved Patient currently on hydralazine 100 mg 3 times daily, Coreg 25 mg twice daily, amlodipine 10 mg daily.  Hyperlipidemia Continue with Lipitor 20 mg daily.  Chronic constipation Improved with bowel prep.   Diabetes mellitus with hyperglycemia Insulin-dependent. CBG (last 3)  Recent Labs    04/10/21 0612 04/10/21 0638 04/10/21 1128  GLUCAP 64* 86 180*   Decreased the semglee to 6 units daily.  Continue with SSI.   Body mass index is 25.41 kg/m. Recommend outpatient follow up with PCP.     Moderate malnutrition: Dietary on board.   Continue to mobilize.  Patient is medically stabilizing.  Found to have rectal cancer in a patient with multiple comorbidities.   Patient will have difficulty coming to radiation treatment from home, will start treatment in the hospital.  Transfer to [facility] today.  DVT prophylaxis: SCDs Code Status: DNR Family Communication: None. disposition: Remains inpatient.  Status is: Inpatient  Remains inpatient appropriate because:Ongoing diagnostic testing needed not appropriate for outpatient work up, Unsafe d/c plan, and IV treatments appropriate due to intensity of illness or inability to take PO  Dispo: The patient is from: Home              Anticipated d/c is to: Home with home health therapies.              Patient currently is not medically stable to d/c.   Difficult to place patient No       Consultants:  Cardiology Gastroenterology Palliative.  Procedures: None Antimicrobials: Flagyl and tetracycline  Subjective: Patient seen and examined.  No overnight events.  He does feel uncomfortable when having a bowel movement in the rectum.  No bleeding noted.  On 4 L oxygen. He is quite overwhelmed with the diagnosis and talking to too many  providers.  Was able to explain to him about the findings.  I also explained to him about palliative nature of radiation therapy.  Objective: Vitals:   04/10/21 0500 04/10/21 0600 04/10/21 0731 04/10/21 1218  BP: (!) 174/82  (!) 147/135 (!) 161/76  Pulse:   72 60  Resp: (!) 23  20 20   Temp:   97.6 F (36.4 C) 98.6 F (37 C)  TempSrc:   Oral Oral  SpO2:      Weight:  75.8 kg    Height:        Intake/Output Summary (Last 24 hours) at 04/10/2021 1405 Last data filed at 04/10/2021 1302 Gross per 24 hour  Intake 480 ml  Output 1900 ml  Net -1420 ml   Filed Weights   04/07/21 0616 04/09/21 0255 04/10/21 0600  Weight: 73.9 kg 76.1 kg 75.8 kg    Examination:  General: Frail debilitated and chronically sick looking but not in any distress. Looks fairly comfortable.  Sitting at the edge of the bed.  On 4 L oxygen. SpO2: 96 % O2 Flow Rate (L/min): 4 L/min FiO2 (%): 40 %  Cardiovascula S1-S2 normal.  No added sounds.    Respiratory: Bilateral clear.  No added sounds. Gastrointestinal: Soft and nontender.  Bowel sounds present. Ext: No swelling or edema.  No cyanosis.    Data Reviewed: I have personally reviewed following labs and imaging studies  CBC:  Recent Labs  Lab 04/06/21 0332 04/07/21 0220 04/07/21 1932 04/08/21 0338 04/09/21 0417 04/10/21 0354  WBC 3.6* 4.8  --  5.6 4.4 4.8  HGB 7.3* 6.9* 9.5* 9.3* 9.0* 9.7*  HCT 24.6* 23.0* 30.5* 30.5* 29.1* 31.6*  MCV 95.0 94.7  --  93.6 94.8 94.3  PLT 203 201  --  201 174 678    Basic Metabolic Panel: Recent Labs  Lab 04/04/21 0311 04/05/21 0201 04/06/21 0332 04/09/21 0417 04/10/21 0354  NA 138 143 141 137 139  K 5.1 4.1 3.9 4.6 4.7  CL 104 110 109 109 110  CO2 28 25 24 23  21*  GLUCOSE 128* 147* 82 95 74  BUN 76* 64* 52* 58* 63*  CREATININE 3.10* 2.76* 2.81* 3.40* 3.33*  CALCIUM 8.6* 8.5* 8.7* 8.8* 8.9  MG  --   --   --  2.3  --     GFR: Estimated Creatinine Clearance: 19.4 mL/min (A) (by C-G formula  based on SCr of 3.33 mg/dL (H)).  Liver Function Tests: No results for input(s): AST, ALT, ALKPHOS, BILITOT, PROT, ALBUMIN in the last 168 hours.  CBG: Recent Labs  Lab 04/09/21 1551 04/09/21 2129 04/10/21 0612 04/10/21 0638 04/10/21 1128  GLUCAP 108* 207* 64* 86 180*     Recent Results (from the past 240 hour(s))  Resp Panel by RT-PCR (Flu A&B, Covid) Nasopharyngeal Swab     Status: None   Collection Time: 04/01/21  6:13 AM   Specimen: Nasopharyngeal Swab; Nasopharyngeal(NP) swabs in vial transport medium  Result Value Ref Range Status   SARS Coronavirus 2 by RT PCR NEGATIVE NEGATIVE Final    Comment: (NOTE) SARS-CoV-2 target nucleic acids are NOT DETECTED.  The SARS-CoV-2 RNA is generally detectable in upper respiratory specimens during the acute phase of infection. The lowest concentration of SARS-CoV-2 viral copies this assay can detect is 138 copies/mL. A negative result does not preclude SARS-Cov-2 infection and should not be used as the sole basis for treatment or other patient management decisions. A negative result may occur with  improper specimen collection/handling, submission of specimen other than nasopharyngeal swab, presence of viral mutation(s) within the areas targeted by this assay, and inadequate number of viral copies(<138 copies/mL). A negative result must be combined with clinical observations, patient history, and epidemiological information. The expected result is Negative.  Fact Sheet for Patients:  EntrepreneurPulse.com.au  Fact Sheet for Healthcare Providers:  IncredibleEmployment.be  This test is no t yet approved or cleared by the [place] FDA and  has been authorized for detection and/or diagnosis of SARS-CoV-2 by FDA under an Emergency Use Authorization (EUA). This EUA will remain  in effect (meaning this test can be used) for the duration of the COVID-19 declaration under Section 564(b)(1) of the  Act, 21 U.S.C.section 360bbb-3(b)(1), unless the authorization is terminated  or revoked sooner.       Influenza A by PCR NEGATIVE NEGATIVE Final   Influenza B by PCR NEGATIVE NEGATIVE Final    Comment: (NOTE) The Xpert Xpress SARS-CoV-2/FLU/RSV plus assay is intended as an aid in the diagnosis of influenza from Nasopharyngeal swab specimens and should not be used as a sole basis for treatment. Nasal washings and aspirates are unacceptable for Xpert Xpress SARS-CoV-2/FLU/RSV testing.  Fact Sheet for Patients: EntrepreneurPulse.com.au  Fact Sheet for Healthcare Providers: IncredibleEmployment.be  This test is not yet approved or cleared by the [place] FDA and has been authorized for detection and/or diagnosis of SARS-CoV-2 by FDA under an Emergency Use  Authorization (EUA). This EUA will remain in effect (meaning this test can be used) for the duration of the COVID-19 declaration under Section 564(b)(1) of the Act, 21 U.S.C. section 360bbb-3(b)(1), unless the authorization is terminated or revoked.  Performed at [facility], [place] [address] [place], [place] [address]   Culture, blood (Routine X 2) w Reflex to ID Panel     Status: None   Collection Time: 04/01/21 12:05 PM   Specimen: BLOOD RIGHT HAND  Result Value Ref Range Status   Specimen Description BLOOD RIGHT HAND  Final   Special Requests   Final    BOTTLES DRAWN AEROBIC AND ANAEROBIC Blood Culture results may not be optimal due to an inadequate volume of blood received in culture bottles   Culture   Final    NO GROWTH 5 DAYS Performed at [facility], [place] [address] [place], [place] [address]    Report Status 04/06/2021 FINAL  Final  Culture, blood (Routine X 2) w Reflex to ID Panel     Status: None   Collection Time: 04/01/21 12:06 PM   Specimen: BLOOD RIGHT HAND  Result Value Ref Range Status   Specimen Description BLOOD RIGHT HAND  Final   Special Requests    Final    BOTTLES DRAWN AEROBIC ONLY Blood Culture results may not be optimal due to an inadequate volume of blood received in culture bottles   Culture   Final    NO GROWTH 5 DAYS Performed at [facility], [place] [address] [place], [place] [address]    Report Status 04/06/2021 FINAL  Final  MRSA Next Gen by PCR, Nasal     Status: None   Collection Time: 04/01/21 12:49 PM   Specimen: Nasal Mucosa; Nasal Swab  Result Value Ref Range Status   MRSA by PCR Next Gen NOT DETECTED NOT DETECTED Final    Comment: (NOTE) The GeneXpert MRSA Assay (FDA approved for NASAL specimens only), is one component of a comprehensive MRSA colonization surveillance program. It is not intended to diagnose MRSA infection nor to guide or monitor treatment for MRSA infections. Test performance is not FDA approved in patients less than 55 years old. Performed at [facility], [place] [address] [place], [place] [address]          Radiology Studies: MR PELVIS W WO CONTRAST  Result Date: 04/08/2021 CLINICAL DATA:  Rectal mass on colonoscopy, suspected rectal cancer, metastatic disease evaluation EXAM: MRI ABDOMEN AND PELVIS WITHOUT AND WITH CONTRAST TECHNIQUE: Multiplanar multisequence MR imaging of the abdomen and pelvis was performed both before and after the administration of intravenous contrast. CONTRAST:  47mL GADAVIST GADOBUTROL 1 MMOL/ML IV SOLN COMPARISON:  None. FINDINGS: Markedly limited evaluation due to motion degradation. Lower chest: Moderate right and small left pleural effusions. Hepatobiliary: No focal hepatic lesion is seen, noting motion degradation. Gallbladder is grossly unremarkable. No intrahepatic or extrahepatic ductal dilatation. Pancreas:  Grossly unremarkable. Spleen:  Grossly unremarkable. Adrenals/Urinary Tract:  Adrenal glands are grossly unremarkable. Subcentimeter bilateral renal cysts.  No hydronephrosis. Trabeculated bladder. Stomach/Bowel: Stomach is grossly  unremarkable. No evidence of bowel obstruction. Enhancing masslike concentric thickening involving the lower rectum (series 29/image 30), corresponding to the recently biopsied rectal Mass. Vascular/Lymphatic:  No evidence of aneurysm. Marked limited evaluation for lymph nodes given motion degradation. Reproductive: Prostate is grossly unremarkable. Other:  No abdominopelvic ascites. Musculoskeletal: No focal osseous lesions. IMPRESSION: Markedly limited evaluation due to motion degradation. Enhancing, masslike concentric wall  thickening involving the lower rectum, corresponding to recently biopsied rectal mass. No definite findings of metastatic disease, noting significant limitations. Assuming this patient has rectal adenocarcinoma, for more accurate staging, consider follow-up CT abdomen/pelvis with contrast in 6 weeks. Electronically Signed   By: Julian Hy M.D.   On: 04/08/2021 20:10   MR ABDOMEN W WO CONTRAST  Result Date: 04/08/2021 CLINICAL DATA:  Rectal mass on colonoscopy, suspected rectal cancer, metastatic disease evaluation EXAM: MRI ABDOMEN AND PELVIS WITHOUT AND WITH CONTRAST TECHNIQUE: Multiplanar multisequence MR imaging of the abdomen and pelvis was performed both before and after the administration of intravenous contrast. CONTRAST:  50mL GADAVIST GADOBUTROL 1 MMOL/ML IV SOLN COMPARISON:  None. FINDINGS: Markedly limited evaluation due to motion degradation. Lower chest: Moderate right and small left pleural effusions. Hepatobiliary: No focal hepatic lesion is seen, noting motion degradation. Gallbladder is grossly unremarkable. No intrahepatic or extrahepatic ductal dilatation. Pancreas:  Grossly unremarkable. Spleen:  Grossly unremarkable. Adrenals/Urinary Tract:  Adrenal glands are grossly unremarkable. Subcentimeter bilateral renal cysts.  No hydronephrosis. Trabeculated bladder. Stomach/Bowel: Stomach is grossly unremarkable. No evidence of bowel obstruction. Enhancing masslike  concentric thickening involving the lower rectum (series 29/image 30), corresponding to the recently biopsied rectal mass. Vascular/Lymphatic:  No evidence of aneurysm. Markedly limited evaluation for lymph nodes given motion degradation. Reproductive: Prostate is grossly unremarkable. Other:  No abdominopelvic ascites. Musculoskeletal: No focal osseous lesions. IMPRESSION: Markedly limited evaluation due to motion degradation. Enhancing, masslike concentric wall thickening involving the lower rectum, corresponding to recently biopsied rectal mass. No definite findings of metastatic disease, noting significant limitations. Assuming this patient has rectal adenocarcinoma, for more accurate staging, consider follow-up CT abdomen/pelvis with contrast in 6 weeks. Electronically Signed   By: Julian Hy M.D.   On: 04/08/2021 20:03        Scheduled Meds:  sodium chloride   Intravenous Once   amLODipine  10 mg Oral Daily   atorvastatin  20 mg Oral Daily   bismuth subsalicylate  127 mg Oral TID AC & HS   brimonidine  1 drop Right Eye BID   carvedilol  25 mg Oral BID   docusate sodium  200 mg Oral BID   dorzolamide  1 drop Both Eyes BID   furosemide  40 mg Oral Daily   hydrALAZINE  100 mg Oral TID   insulin aspart  0-9 Units Subcutaneous TID WC   insulin glargine-yfgn  6 Units Subcutaneous Daily   isosorbide mononitrate  60 mg Oral Daily   latanoprost  1 drop Left Eye QHS   metroNIDAZOLE  500 mg Oral TID   multivitamin  1 tablet Oral QHS   pantoprazole  40 mg Oral BID   Ensure Max Protein  11 oz Oral Daily   sodium chloride flush  3 mL Intravenous Q12H   tetracycline  500 mg Oral BID AC   Continuous Infusions:     LOS: 9 days    Total time spent: 30 minutes    Barb Merino, MD [facility]    04/10/2021, 2:05 PM

## 2021-04-10 NOTE — Progress Notes (Signed)
At 1745-1800, report given to charge nurse Megan at [facility] unit [place].  Pt will transfer over to [place] room 1417.  Pt to begin radiation treatments tomorrow.  Pt aware of transfer and given unit and room number.    Pt will be transported via [facility] transport.

## 2021-04-10 NOTE — Progress Notes (Signed)
GI Location of Tumor / Histology:  Rectal adenocarcinoma  Robert Chaney presented with symptoms of: presented to the emergency department (early September) with shortness of breath.  Initially, he was hypotensive and started on nitroglycerin drip.  Concern was for CHF and breathing did improve with BiPAP.  Chest x-ray showed slight worsening consolidation in the right lower lobe compatible with pneumonia.  He had a drop in his hemoglobin from 7.7-6.4 this admission.  He did have an EGD performed during recent hospital admission on 03/03/2021 which showed mild gastritis and was positive for H. pylori which was never treated.  This admission, he underwent colonoscopy on 04/06/2021 and he was found to have a fungating and ulcerated mass in the rectum which was partially circumferential.  This was biopsied and results consistent with invasive moderately differentiated colorectal adenocarcinoma  Biopsies revealed:  04/06/2021 FINAL MICROSCOPIC DIAGNOSIS:  A. COLON, ASCENDING AND CECUM, POLYPECTOMY:  - Tubular adenoma (s).  - No high-grade dysplasia or carcinoma.  - Inflammatory polyp with erosion.  B. COLON, DESCENDING, POLYPECTOMY:  - Tubular adenoma (s).  - No high-grade dysplasia or carcinoma.  C. RECTUM, MASS, BIOPSY:  - Invasive moderately differentiated colorectal adenocarcinoma.  - See comment.   Past/Anticipated interventions by surgeon, if any:  Per Dr. [name]: "Given his extensive medical comorbidities and decompensated cardiorespiratory status patient is not currently a candidate for surgery"  Past/Anticipated interventions by medical oncology, if any:  Seen by Dr. Sullivan Lone while in-patient 04/09/2021 --PLAN Met with the patient and discussed his current clinical status in details. Discussed his new diagnosis of rectal moderately differentiated adenocarcinoma.  MRI of the abdomen and pelvis did not show overt pelvic adenopathy or liver metastases and CT of the chest does not show any  obvious metastatic disease. There was however significant motion artifact on his MRI of the abdomen and pelvis to rule out any adenopathy. Based on imaging this will be probably T3 lesion, cN 0 cM0-stage IIA. We will recommend consulting radiation oncology for radiation to try to address his ongoing GI bleeding. Patient not a good candidate for concurrent chemotherapy.  Cannot use Xeloda due to his significant kidney problems. Cannot safely use 5-fluorouracil concurrent with radiation due to his significant cardiac comorbidities which are not stable at this time. Ongoing goals of care discussion. Patient understands the complex nature of his medical issues and limitations in his treatment. Consider IV Feraheme 510 mg x 1 if IV iron not received already. Transfuse as needed for hemoglobin less than 7.5 Cardiorespiratory management as per hospital medicine. He requested to be also involved his brother Jayland Null _0 .  Weight changes, if any:  Wt Readings from Last 3 Encounters:  04/10/21 167 lb 1.7 oz (75.8 kg)  03/24/21 163 lb (73.9 kg)  03/22/21 160 lb 11.2 oz (72.9 kg)   Bowel/Bladder complaints, if any: Patient denies. Reports he is urinating without difficulty, and has had 2 painless BMs (1 yesterday, 1 today) without any sings of blood in his stool  Nausea / Vomiting, if any: Patient denies. Reports he's eating/drinking well without issue  Pain issues, if any:  Reports upper left leg pain (reports it's more posteriorly located but feels deep). Reports he is able to ambulate by using a walker  Any blood per rectum:   Had mild rectal bleeding prior to colonoscopy on 04/06/2021 (Per Dr. Dayna Barker: attributed to anal fissure by GI last month); denies any recent episodes of rectal bleeding  SAFETY ISSUES: Prior radiation? No Pacemaker/ICD? No  Possible current pregnancy? N/A Is the patient on methotrexate? No  Current Complaints/Details: Patient would like his  friend/relative [place] to be included during conversation

## 2021-04-10 NOTE — Progress Notes (Addendum)
OT Cancellation Note  Patient Details Name: TRYSTON GILLIAM MRN: [id number] DOB: 1951/01/07   Cancelled Treatment:    Reason Eval/Treat Not Completed: Patient declined, no reason specified Pt declined OT session this AM. Pt requested OT check back if possible to allow pt to go through his mail first.   Checked back around 11:15AM with palliative at bedside. Will follow up for OT session, likely tomorrow.   Layla Maw 04/10/2021, 9:06 AM

## 2021-04-10 NOTE — Progress Notes (Signed)
Nutrition Follow-up  DOCUMENTATION CODES:   Non-severe (moderate) malnutrition in context of chronic illness  INTERVENTION:   -D/c Ensure Max -Continue MVI with minerals daily -Magic cup TID with meals, each supplement provides 290 kcal and 9 grams of protein   NUTRITION DIAGNOSIS:   Moderate Malnutrition related to chronic illness (CHF) as evidenced by mild fat depletion, mild muscle depletion.  Ongoing  GOAL:   Patient will meet greater than or equal to 90% of their needs  Progressing   MONITOR:   PO intake, I & O's, Labs, Supplement acceptance  REASON FOR ASSESSMENT:   Consult Other (Comment) (Nutrition Goals)  ASSESSMENT:   70 y.o. male presented to the ED with chest pain and shortness of breath. Pt recently admitted 9/23-9/29 with acute on chronic respiratory failure. PMH includes CHF, CKD IV, HTN, HLD, DM, and anemia. Pt admitted with acute on chronic CHF and multifocal pneumonia.  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 Pt is very overwhelmed regarding recent cancer diagnosis. Plan to possibly start radiation treatments soon and transfer to [facility].   Pt remains with good oral intake. Noted meal completion 75-100%. RN reports pt has been refusing Ensure Max supplements.   Medications reviewed and include colace and lasix.  Labs reviewed: CBGS: [id number] (inpatient orders for glycemic control are 0-9 units insulin aspart TID with meals and 6 units insulin glargine-yfgn daily).    Diet Order:   Diet Order             Diet Heart Room service appropriate? Yes; Fluid consistency: Thin  Diet effective now                    EDUCATION NEEDS:   No education needs have been identified at this time  Skin:  Skin Assessment: Reviewed RN Assessment  Last BM:  04/09/21  Height:   Ht Readings from Last 1 Encounters:  04/09/21 5\' 8"  (1.727 m)    Weight:   Wt Readings from Last 1 Encounters:  04/10/21 75.8 kg    Ideal Body Weight:  54.5 kg  BMI:  Body mass index is 25.41 kg/m.  Estimated Nutritional Needs:   Kcal:  2100-2300  Protein:  105-120 grams  Fluid:  >/= 2 L    Loistine Chance, RD, LDN, [place] Registered Dietitian II Certified Diabetes Care and Education Specialist Please refer to [facility] for RD and/or RD on-call/weekend/after hours pager

## 2021-04-10 NOTE — Progress Notes (Signed)
Physical Therapy Treatment Patient Details Name: Robert Chaney MRN: [id number] DOB: 10/06/1950 Today's Date: 04/10/2021   History of Present Illness Pt is a 70 y/o male who presented with SOB and chest pain.Pt required BiPAP and nitroglycerin drip, admitted for further workup. Pt recently admitted 9/23-9/29 due to acute on chronic respiratory failure w/ CHF and hypertensive urgency. PMH: CHF, DM, CKD, HTN, PNA. Patient with recently diagnosed colorectal cancer    PT Comments    Patient received sitting on edge of bed, agrees to PT. Requires increased time to manage lines and get ready to move. Patient is mod independent with transfers, ambulated with supervision and RW 500 feet. Assist for line management only. Patient desaturated to 86% on 4  liters with ambulation. He will continue to benefit from skilled PT while here to improve activity tolerance for safe return home.    Recommendations for follow up therapy are one component of a multi-disciplinary discharge planning process, led by the attending physician.  Recommendations may be updated based on patient status, additional functional criteria and insurance authorization.  Follow Up Recommendations  No PT follow up;Supervision - Intermittent     Equipment Recommendations  None recommended by PT    Recommendations for Other Services       Precautions / Restrictions Precautions Precautions: Fall Precaution Comments: monitor O2, wears 4L O2 baseline Restrictions Weight Bearing Restrictions: No     Mobility  Bed Mobility               General bed mobility comments: patient received sitting up on side of bed    Transfers Overall transfer level: Modified independent Equipment used: Rolling walker (2 wheeled) Transfers: Sit to/from Stand Sit to Stand: Modified independent (Device/Increase time)            Ambulation/Gait Ambulation/Gait assistance: Supervision Gait Distance (Feet): 500 Feet Assistive device:  Rolling walker (2 wheeled) Gait Pattern/deviations: Step-through pattern Gait velocity: WNL   General Gait Details: Patient ambulated well with RW and supervision. No overt difficulties. O2 saturations at 86% upon returning to recliner in room. With pursed lip breathing and rest patient able to bring up to 92%   Stairs             Wheelchair Mobility    Modified Rankin (Stroke Patients Only)       Balance Overall balance assessment: Modified Independent Sitting-balance support: Feet supported Sitting balance-Leahy Scale: Good     Standing balance support: Bilateral upper extremity supported;During functional activity Standing balance-Leahy Scale: Good Standing balance comment: no lob or difficulty this session with use of RW                            Cognition Arousal/Alertness: Awake/alert Behavior During Therapy: WFL for tasks assessed/performed Overall Cognitive Status: Within Functional Limits for tasks assessed                                        Exercises      General Comments        Pertinent Vitals/Pain Pain Assessment: Faces Faces Pain Scale: Hurts a little bit Pain Location: left leg Pain Descriptors / Indicators: Discomfort Pain Intervention(s): Monitored during session;Limited activity within patient's tolerance;Repositioned    Home Living  Prior Function            PT Goals (current goals can now be found in the care plan section) Acute Rehab PT Goals Patient Stated Goal: return home PT Goal Formulation: With patient Time For Goal Achievement: 04/17/21 Potential to Achieve Goals: Good Additional Goals Additional Goal #1: Pt to score >19 on DGI to indicate minimal falls risk. Progress towards PT goals: Progressing toward goals    Frequency    Min 3X/week      PT Plan Current plan remains appropriate    Co-evaluation              AM-PAC PT "6 Clicks" Mobility    Outcome Measure  Help needed turning from your back to your side while in a flat bed without using bedrails?: None Help needed moving from lying on your back to sitting on the side of a flat bed without using bedrails?: None Help needed moving to and from a bed to a chair (including a wheelchair)?: None Help needed standing up from a chair using your arms (e.g., wheelchair or bedside chair)?: None Help needed to walk in hospital room?: None Help needed climbing 3-5 steps with a railing? : A Little 6 Click Score: 23    End of Session Equipment Utilized During Treatment: Oxygen Activity Tolerance: Patient tolerated treatment well Patient left: in chair;with call bell/phone within reach Nurse Communication: Mobility status PT Visit Diagnosis: Difficulty in walking, not elsewhere classified (R26.2)     Time: 2409-7353 PT Time Calculation (min) (ACUTE ONLY): 24 min  Charges:  $Gait Training: 23-37 mins                     Jermya Dowding, PT, GCS 04/10/21,1:48 PM

## 2021-04-10 NOTE — Progress Notes (Signed)
The patient arrived to the unit, alert, oriented x4 ambulatory 1 assist. Skin is intact dry, flaky over all, Right shin abrasion, healing. Bilateral cracked heels.

## 2021-04-10 NOTE — Progress Notes (Signed)
Radiation Oncology         [phone] ________________________________  Initial Inpatient Consultation by telephone.  The patient opted for telemedicine to maximize safety during the pandemic and due to his location in a different hospital at present.  MyChart video was not obtainable.   Name: Robert Chaney MRN: [id number]  Date: 04/10/2021  DOB: Jul 15, 1950  YI:[id number], Christean Grief, MD  Barb Merino, MD   REFERRING PHYSICIAN: Barb Merino, MD  DIAGNOSIS:    ICD-10-CM   1. Rectal adenocarcinoma ([place])  C20      Invasive moderately differentiated colorectal adenocarcinoma  Cancer Staging Rectal adenocarcinoma [facility]) Staging form: Colon and Rectum, AJCC 8th Edition - Clinical stage from 04/10/2021: Stage IIA (cT3, cN0, cM0) - Signed by Eppie Gibson, MD on 04/11/2021 Total positive nodes: 0  CHIEF COMPLAINT: Here to discuss management of colorectal cancer  HISTORY OF PRESENT ILLNESS: Robert Chaney is a 70 y.o. male who presented with acute onset CP and SOB to the ED on 04/01/21 and admitted for CHF exacerbation. The patient was hospitalized shortly before this on 03/16/21-09/29 for the same and treated with BIPAP and Lasix.  He also had gastritis on 9/10, EGD and and bronchoscopy were negative on 9/12 during prior hospitalization. Patient was admitted on 04/01/21 and treated with BIPAP and and NTG drip for hypertension. Chest x-ray performed on 04/01/21 demonstrated slight worsening of a consolidation in the right upper lobe, compatible with pneumonia. There was also evidence to suggest developing multilobar bilateral bronchopneumonia.  On 04/06/21 due to suspected gastrointestinal bleeding, he underwent colonoscopy revealing a rectal mass. Biopsies were collected of the rectal mass which revealed invasive moderately differentiated colorectal adenocarcinoma. Polypectomy of the ascending and descending colon and cecum revealed tubular adenoma and no high-grade dysplasia or  carcinoma.  Pertinent imaging since then includes:  --Chest CT on 04/07/21 which demonstrated (again) the right upper lobe opacity, suspicious for pneumonia, and moderate right and small left pleural effusions. A 4 mm ground-glass nodule in the anterior right upper lobe was also seen, noted as possibly related to suspected edema.  --MRI of the abdomen and pelvis with and without contrast with significant motion degradation on 04/08/21 demonstrated enhancing, masslike concentric wall thickening involving the lower rectum, corresponding to recently biopsied rectal mass. No definite findings of metastatic disease were seen, but again, this was a limited study due to motion degradation  I personally reviewed his imaging  He reports that he has experienced rectal pain w/ BMs.  He has also had bleeding with BMs, but since hospitalization that has gone away.  He reports that he has lost 27 lbs. Feels weak. Lives in an apt, alone, in [place]. Cannot drive.  Cannot care for self right now.  His friend, [place], is present on the call today  I spoken extensively with the patient's team.  Medical oncology does not feel that he is an operative candidate due to his significant comorbidities.  He is also not a candidate for systemic therapy due to his significant comorbidities.  His ECOG performance status has been estimated between 3 and 4.  Note significant medical history below including chronic kidney disease, CHF, diabetes, cardiomyopathy.  The patient has met with palliative care.  He is interested in palliative options to address his rectal symptoms and possibly slow down the progression of his disease.  PREVIOUS RADIATION THERAPY: No  PAST MEDICAL HISTORY:  has a past medical history of Altered mental status, Anemia, CKD (chronic kidney disease), stage III ([place]), Diabetes  mellitus, Diastolic CHF, chronic (HCC), Elevated troponin, Glaucoma, Hyperlipidemia, Hypertension, Hypertrophic cardiomyopathy ([place]), NSVT  (nonsustained ventricular tachycardia), Premature atrial contractions, PVC's (premature ventricular contractions), and SVT (supraventricular tachycardia) ([place]).    PAST SURGICAL HISTORY: Past Surgical History:  Procedure Laterality Date   BIOPSY  03/03/2021   Procedure: BIOPSY;  Surgeon: Jackquline Denmark, MD;  Location: [place];  Service: Endoscopy;;   BIOPSY  04/06/2021   Procedure: BIOPSY;  Surgeon: [name], MD;  Location: [place];  Service: Gastroenterology;;   BRONCHIAL BIOPSY  03/05/2021   Procedure: BRONCHIAL BIOPSIES;  Surgeon: Candee Furbish, MD;  Location: [place];  Service: Pulmonary;;   BRONCHIAL BRUSHINGS  03/05/2021   Procedure: BRONCHIAL BRUSHINGS;  Surgeon: Candee Furbish, MD;  Location: [place];  Service: Pulmonary;;   BRONCHIAL WASHINGS  03/05/2021   Procedure: BRONCHIAL WASHINGS;  Surgeon: Candee Furbish, MD;  Location: [place];  Service: Pulmonary;;   COLONOSCOPY WITH PROPOFOL N/A 04/06/2021   Procedure: COLONOSCOPY WITH PROPOFOL;  Surgeon: [name], MD;  Location: [place];  Service: Gastroenterology;  Laterality: N/A;   ESOPHAGOGASTRODUODENOSCOPY (EGD) WITH PROPOFOL N/A 03/03/2021   Procedure: ESOPHAGOGASTRODUODENOSCOPY (EGD) WITH PROPOFOL;  Surgeon: Jackquline Denmark, MD;  Location: [facility] ENDOSCOPY;  Service: Endoscopy;  Laterality: N/A;   HEMOSTASIS CONTROL  03/05/2021   Procedure: HEMOSTASIS CONTROL;  Surgeon: Candee Furbish, MD;  Location: [facility] ENDOSCOPY;  Service: Pulmonary;;   NO PAST SURGERIES     POLYPECTOMY  04/06/2021   Procedure: POLYPECTOMY;  Surgeon: [name], MD;  Location: [place];  Service: Gastroenterology;;   VIDEO BRONCHOSCOPY N/A 03/05/2021   Procedure: VIDEO BRONCHOSCOPY WITH FLUORO;  Surgeon: Candee Furbish, MD;  Location[facility] ENDOSCOPY;  Service: Pulmonary;  Laterality: N/A;    FAMILY HISTORY: family history includes Diabetes in his brother and brother; Hypertension in his brother, father, and mother; Stroke  in his father.  SOCIAL HISTORY:  reports that he has never smoked. He has never used smokeless tobacco. He reports that he does not drink alcohol and does not use drugs.  ALLERGIES: Patient has no known allergies.  MEDICATIONS:  No current facility-administered medications for this encounter.   No current outpatient medications on file.   Facility-Administered Medications Ordered in Other Encounters  Medication Dose Route Frequency Provider Last Rate Last Admin   0.9 %  sodium chloride infusion (Manually program via Guardrails IV Fluids)   Intravenous Once Shela Leff, MD       acetaminophen (TYLENOL) tablet 650 mg  650 mg Oral Q6H PRN Karmen Bongo, MD       Or   acetaminophen (TYLENOL) suppository 650 mg  650 mg Rectal Q6H PRN Karmen Bongo, MD       amLODipine (NORVASC) tablet 10 mg  10 mg Oral Daily Karmen Bongo, MD   10 mg at 04/10/21 1006   atorvastatin (LIPITOR) tablet 20 mg  20 mg Oral Daily Karmen Bongo, MD   20 mg at 04/10/21 1006   bisacodyl (DULCOLAX) EC tablet 5 mg  5 mg Oral Daily PRN Karmen Bongo, MD       bismuth subsalicylate (PEPTO BISMOL) chewable tablet 524 mg  524 mg Oral TID AC & HS [name], MD   524 mg at 04/11/21 0606   brimonidine (ALPHAGAN) 0.2 % ophthalmic solution 1 drop  1 drop Right Eye BID Karmen Bongo, MD   1 drop at 04/10/21 2146   carvedilol (COREG) tablet 25 mg  25 mg Oral BID Barrett, Evelene Croon,  PA-C   25 mg at 04/10/21 2100   docusate sodium (COLACE) capsule 200 mg  200 mg Oral BID Karmen Bongo, MD   200 mg at 04/10/21 2108   dorzolamide (TRUSOPT) 2 % ophthalmic solution 1 drop  1 drop Both Eyes BID Karmen Bongo, MD   1 drop at 04/10/21 2230   furosemide (LASIX) tablet 40 mg  40 mg Oral Daily Barb Merino, MD   40 mg at 04/10/21 1006   hydrALAZINE (APRESOLINE) injection 5 mg  5 mg Intravenous Q4H PRN Karmen Bongo, MD   5 mg at 04/02/21 1236   hydrALAZINE (APRESOLINE) tablet 100 mg  100 mg Oral TID Karmen Bongo,  MD   100 mg at 04/10/21 2101   insulin aspart (novoLOG) injection 0-9 Units  0-9 Units Subcutaneous TID WC Karmen Bongo, MD   1 Units at 04/10/21 1642   insulin glargine-yfgn (SEMGLEE) injection 6 Units  6 Units Subcutaneous Daily Hosie Poisson, MD   6 Units at 04/10/21 1005   isosorbide mononitrate (IMDUR) 24 hr tablet 60 mg  60 mg Oral Daily Hosie Poisson, MD   60 mg at 04/10/21 1006   latanoprost (XALATAN) 0.005 % ophthalmic solution 1 drop  1 drop Left Eye Ivery Quale, MD   1 drop at 04/10/21 2145   metroNIDAZOLE (FLAGYL) tablet 500 mg  500 mg Oral TID [name], MD   500 mg at 04/10/21 2108   multivitamin (RENA-VIT) tablet 1 tablet  1 tablet Oral QHS Hosie Poisson, MD   1 tablet at 04/10/21 2108   nitroGLYCERIN (NITROSTAT) SL tablet 0.4 mg  0.4 mg Sublingual Q5 min PRN Barb Merino, MD   0.4 mg at 04/08/21 0422   oxyCODONE (Oxy IR/ROXICODONE) immediate release tablet 5 mg  5 mg Oral Q4H PRN Karmen Bongo, MD   5 mg at 04/10/21 2108   pantoprazole (PROTONIX) EC tablet 40 mg  40 mg Oral BID Hosie Poisson, MD   40 mg at 04/10/21 2108   polyethylene glycol (MIRALAX / GLYCOLAX) packet 17 g  17 g Oral Daily PRN Karmen Bongo, MD   17 g at 04/02/21 1236   sodium chloride flush (NS) 0.9 % injection 3 mL  3 mL Intravenous Lillia Mountain, MD   3 mL at 04/10/21 2110   sodium phosphate (FLEET) 7-19 GM/118ML enema 1 enema  1 enema Rectal Once PRN Karmen Bongo, MD       tetracycline (SUMYCIN) capsule 500 mg  500 mg Oral BID AC [name], MD   500 mg at 04/11/21 7902    REVIEW OF SYSTEMS:  Notable for that above.   PHYSICAL EXAM:  vitals were not taken for this visit.   General: Alert and oriented, in no acute distress    LABORATORY DATA:  Lab Results  Component Value Date   WBC 5.1 04/11/2021   HGB 9.1 (L) 04/11/2021   HCT 30.5 (L) 04/11/2021   MCV 96.8 04/11/2021   PLT 169 04/11/2021   CMP     Component Value Date/Time   NA 136 04/11/2021 0440   NA 138  05/09/2020 1120   K 5.0 04/11/2021 0440   CL 108 04/11/2021 0440   CO2 21 (L) 04/11/2021 0440   GLUCOSE 87 04/11/2021 0440   BUN 65 (H) 04/11/2021 0440   BUN 41 (H) 05/09/2020 1120   CREATININE 3.25 (H) 04/11/2021 0440   CALCIUM 8.7 (L) 04/11/2021 0440   PROT 6.9 03/19/2021 1101   PROT 7.0 11/07/2016  3716   ALBUMIN 2.2 (L) 03/22/2021 0346   ALBUMIN 3.7 11/07/2016 0922   AST 9 (L) 03/19/2021 1101   ALT 12 03/19/2021 1101   ALKPHOS 62 03/19/2021 1101   BILITOT 0.8 03/19/2021 1101   BILITOT 0.6 11/07/2016 0922   GFRNONAA 19 (L) 04/11/2021 0440   GFRAA 28 (L) 05/09/2020 1120         RADIOGRAPHY: DG Chest 1 View  Result Date: 03/20/2021 CLINICAL DATA:  Volume overload EXAM: CHEST  1 VIEW COMPARISON:  03/19/2021, CT 03/07/2021 FINDINGS: Cardiomegaly with vascular congestion. Mild perihilar ground-glass opacity overall diminished compared to the radiograph from 09/26. Trace pleural effusions. More focal consolidation in the right mid lung and medial right base are also slightly improved. Mild consolidation medial left base. Aortic atherosclerosis. No pneumothorax IMPRESSION: 1. Cardiomegaly with overall decreased bilateral ground-glass opacity/possible edema compared to prior. 2. There is residual consolidation in the right mid and lower lung and the medial left base which may reflect pneumonia Electronically Signed   By: Donavan Foil M.D.   On: 03/20/2021 19:42   CT CHEST WO CONTRAST  Result Date: 04/07/2021 CLINICAL DATA:  GI cancer, for staging EXAM: CT CHEST WITHOUT CONTRAST TECHNIQUE: Multidetector CT imaging of the chest was performed following the standard protocol without IV contrast. COMPARISON:  03/07/2021 FINDINGS: Cardiovascular: Mild cardiomegaly. No pericardial effusion. Hypodense blood pool relative to myocardium, suggesting anemia. No evidence of thoracic aortic aneurysm. Atherosclerotic calcifications of the aortic arch. Mild coronary atherosclerosis of the LAD and right  coronary artery. Mediastinum/Nodes: No suspicious mediastinal lymphadenopathy. Visualized thyroid is unremarkable. Lungs/Pleura: Patchy opacity in the posterior right upper lobe (series 8/image 89), suggesting pneumonia. Moderate right and small left pleural effusions. Mild interlobular septal thickening in the upper lobes suggests very mild superimposed edema is possible. Mild bilateral lower lobe compressive atelectasis. 4 mm ground-glass nodule in the anterior right upper lobe (series 8/image 83), possibly related to the suspected edema, but warranting attention on follow-up. No pneumothorax. Upper Abdomen: Visualized upper abdomen is grossly unremarkable, noting vascular calcifications. Musculoskeletal: Degenerative changes of the visualized thoracolumbar spine. IMPRESSION: Right upper lobe opacity, suspicious for pneumonia. Moderate right and small left pleural effusions. Suspected mild superimposed pulmonary edema. 4 mm ground-glass nodule in the anterior right upper lobe, possibly related to the suspected edema, but warranting attention on follow-up. Consider follow-up CT chest in 3-6 months. No findings specific for metastatic disease in the chest. Aortic Atherosclerosis (ICD10-I70.0). Electronically Signed   By: Julian Chaney M.D.   On: 04/07/2021 21:12   MR PELVIS W WO CONTRAST  Result Date: 04/08/2021 CLINICAL DATA:  Rectal mass on colonoscopy, suspected rectal cancer, metastatic disease evaluation EXAM: MRI ABDOMEN AND PELVIS WITHOUT AND WITH CONTRAST TECHNIQUE: Multiplanar multisequence MR imaging of the abdomen and pelvis was performed both before and after the administration of intravenous contrast. CONTRAST:  75m GADAVIST GADOBUTROL 1 MMOL/ML IV SOLN COMPARISON:  None. FINDINGS: Markedly limited evaluation due to motion degradation. Lower chest: Moderate right and small left pleural effusions. Hepatobiliary: No focal hepatic lesion is seen, noting motion degradation. Gallbladder is grossly  unremarkable. No intrahepatic or extrahepatic ductal dilatation. Pancreas:  Grossly unremarkable. Spleen:  Grossly unremarkable. Adrenals/Urinary Tract:  Adrenal glands are grossly unremarkable. Subcentimeter bilateral renal cysts.  No hydronephrosis. Trabeculated bladder. Stomach/Bowel: Stomach is grossly unremarkable. No evidence of bowel obstruction. Enhancing masslike concentric thickening involving the lower rectum (series 29/image 30), corresponding to the recently biopsied rectal Mass. Vascular/Lymphatic:  No evidence of aneurysm. Marked limited evaluation for  lymph nodes given motion degradation. Reproductive: Prostate is grossly unremarkable. Other:  No abdominopelvic ascites. Musculoskeletal: No focal osseous lesions. IMPRESSION: Markedly limited evaluation due to motion degradation. Enhancing, masslike concentric wall thickening involving the lower rectum, corresponding to recently biopsied rectal mass. No definite findings of metastatic disease, noting significant limitations. Assuming this patient has rectal adenocarcinoma, for more accurate staging, consider follow-up CT abdomen/pelvis with contrast in 6 weeks. Electronically Signed   By: Julian Chaney M.D.   On: 04/08/2021 20:10   MR ABDOMEN W WO CONTRAST  Result Date: 04/08/2021 CLINICAL DATA:  Rectal mass on colonoscopy, suspected rectal cancer, metastatic disease evaluation EXAM: MRI ABDOMEN AND PELVIS WITHOUT AND WITH CONTRAST TECHNIQUE: Multiplanar multisequence MR imaging of the abdomen and pelvis was performed both before and after the administration of intravenous contrast. CONTRAST:  72m GADAVIST GADOBUTROL 1 MMOL/ML IV SOLN COMPARISON:  None. FINDINGS: Markedly limited evaluation due to motion degradation. Lower chest: Moderate right and small left pleural effusions. Hepatobiliary: No focal hepatic lesion is seen, noting motion degradation. Gallbladder is grossly unremarkable. No intrahepatic or extrahepatic ductal dilatation.  Pancreas:  Grossly unremarkable. Spleen:  Grossly unremarkable. Adrenals/Urinary Tract:  Adrenal glands are grossly unremarkable. Subcentimeter bilateral renal cysts.  No hydronephrosis. Trabeculated bladder. Stomach/Bowel: Stomach is grossly unremarkable. No evidence of bowel obstruction. Enhancing masslike concentric thickening involving the lower rectum (series 29/image 30), corresponding to the recently biopsied rectal mass. Vascular/Lymphatic:  No evidence of aneurysm. Markedly limited evaluation for lymph nodes given motion degradation. Reproductive: Prostate is grossly unremarkable. Other:  No abdominopelvic ascites. Musculoskeletal: No focal osseous lesions. IMPRESSION: Markedly limited evaluation due to motion degradation. Enhancing, masslike concentric wall thickening involving the lower rectum, corresponding to recently biopsied rectal mass. No definite findings of metastatic disease, noting significant limitations. Assuming this patient has rectal adenocarcinoma, for more accurate staging, consider follow-up CT abdomen/pelvis with contrast in 6 weeks. Electronically Signed   By: SJulian HyM.D.   On: 04/08/2021 20:03   DG Chest Port 1 View  Result Date: 04/01/2021 CLINICAL DATA:  70year old male with history of shortness of breath. EXAM: PORTABLE CHEST 1 VIEW COMPARISON:  Chest x-ray 03/20/2021. FINDINGS: Persistent consolidation in the inferior aspect of the right upper lobe. Patchy areas of interstitial prominence and several ill-defined opacities are also now noted elsewhere throughout the lungs bilaterally. Small right pleural effusion. No left pleural effusion. No pneumothorax. No cephalization of the pulmonary vasculature. Heart size is mildly enlarged. The patient is rotated to the right on today's exam, resulting in distortion of the mediastinal contours and reduced diagnostic sensitivity and specificity for mediastinal pathology. Atherosclerotic calcifications in the thoracic aorta.  IMPRESSION: 1. Slight worsening consolidation in the right upper lobe, compatible with pneumonia. There is also evidence to suggest developing multilobar bilateral bronchopneumonia, as above. 2. Cardiomegaly. 3. Aortic atherosclerosis. Electronically Signed   By: DVinnie LangtonM.D.   On: 04/01/2021 06:25   DG CHEST PORT 1 VIEW  Result Date: 03/19/2021 CLINICAL DATA:  Dyspnea. EXAM: PORTABLE CHEST 1 VIEW COMPARISON:  03/17/2021 FINDINGS: AP view of the chest again demonstrates airspace disease and consolidation in the mid right lung. There appears to be a small amount of pleural fluid particularly on the right side. Hazy densities in the left lung may represent a small amount of left lung airspace disease. Heart size is enlarged but stable. Atherosclerotic calcifications at the aortic arch. Trachea is midline. Negative for a pneumothorax. IMPRESSION: 1. No significant change in the airspace disease and consolidation in the  right lung. Suspect a small amount of pleural fluid. 2. Probable mild airspace disease in left lung. Electronically Signed   By: Markus Daft M.D.   On: 03/19/2021 08:54   DG Chest Port 1 View  Result Date: 03/17/2021 CLINICAL DATA:  Hypoxic respiratory failure. EXAM: PORTABLE CHEST 1 VIEW COMPARISON:  03/16/2021 FINDINGS: Normal scratch set stable cardiomediastinal contours. No pleural effusion identified. There is diffuse pulmonary vascular congestion. Dense airspace consolidation within the right upper lobe is unchanged. Mild patchy airspace densities are noted within the right lower lobe, left midlung and left base, also unchanged. IMPRESSION: 1. No change in aeration to the lungs compared with prior exam. 2. Stable pulmonary vascular congestion. Electronically Signed   By: Kerby Moors M.D.   On: 03/17/2021 08:10   DG Chest Port 1 View  Result Date: 03/16/2021 CLINICAL DATA:  Dyspnea. EXAM: PORTABLE CHEST 1 VIEW COMPARISON:  March 05, 2021 FINDINGS: Marked severity  infiltrate is seen within the mid right lung with moderate severity infiltrate noted along the right lung base. This represents a new finding when compared to the prior study. There is no evidence of a pleural effusion or pneumothorax. The heart size and mediastinal contours are within normal limits. Marked severity calcification of the thoracic aorta is seen. Multilevel degenerative changes are noted throughout the thoracic spine. IMPRESSION: Moderate to marked severity infiltrates within the mid right lung and right lung base. Electronically Signed   By: Virgina Norfolk M.D.   On: 03/16/2021 20:57   DG Swallowing Func-Speech Pathology  Result Date: 03/19/2021 Table formatting from the original result was not included. Objective Swallowing Evaluation: Type of Study: MBS-Modified Barium Swallow Study  Patient Details Name: COTE MAYABB MRN: [id number] Date of Birth: 12-24-1950 Today's Date: 03/19/2021 Time: SLP Start Time (ACUTE ONLY): 1350 -SLP Stop Time (ACUTE ONLY): 1407 SLP Time Calculation (min) (ACUTE ONLY): 17 min Past Medical History: Past Medical History: Diagnosis Date  Altered mental status   a. 05/2017 - adm with blurred vision, somnolence in setting of AKI and high blood sugar.  Anemia   CKD (chronic kidney disease), stage III (HCC)   Diabetes mellitus   Diastolic CHF, chronic ([place])   A.  03/2009 Echo: EF 60-65%, Gr II diast dysfxn  Elevated troponin   a. 2013 - troponin 1.4. b. 2019 - troponin 0.32; neg nuc 07/2017.  Glaucoma   Hyperlipidemia   HYPERCHOLESTEROLEMIA  Hypertension   MARKED LEFT VENTRICULAR HYPERTROPHY BY PREVIOUS ECHOCARDIOGRAM--HE HAS HYPERDYNAMIC LEFT VENTRICULAR SYSTOLIC FUNCTION AND HAS IMPAIRED RELAXATION BY ECHO  Hypertrophic cardiomyopathy (HCC)   NSVT (nonsustained ventricular tachycardia) (HCC)   Premature atrial contractions   PVC's (premature ventricular contractions)   SVT (supraventricular tachycardia) ([place])  Past Surgical History: Past Surgical History: Procedure  Laterality Date  BIOPSY  03/03/2021  Procedure: BIOPSY;  Surgeon: Jackquline Denmark, MD;  Location: [place];  Service: Endoscopy;;  BRONCHIAL BIOPSY  03/05/2021  Procedure: BRONCHIAL BIOPSIES;  Surgeon: Candee Furbish, MD;  Location: [facility] ENDOSCOPY;  Service: Pulmonary;;  BRONCHIAL BRUSHINGS  03/05/2021  Procedure: BRONCHIAL BRUSHINGS;  Surgeon: Candee Furbish, MD;  Location: [facility] ENDOSCOPY;  Service: Pulmonary;;  BRONCHIAL WASHINGS  03/05/2021  Procedure: BRONCHIAL WASHINGS;  Surgeon: Candee Furbish, MD;  Location: [facility] ENDOSCOPY;  Service: Pulmonary;;  ESOPHAGOGASTRODUODENOSCOPY (EGD) WITH PROPOFOL N/A 03/03/2021  Procedure: ESOPHAGOGASTRODUODENOSCOPY (EGD) WITH PROPOFOL;  Surgeon: Jackquline Denmark, MD;  Location: [facility] ENDOSCOPY;  Service: Endoscopy;  Laterality: N/A;  HEMOSTASIS CONTROL  03/05/2021  Procedure: HEMOSTASIS CONTROL;  Surgeon: Tamala Julian,  Darnelle Maffucci, MD;  Location: [facility] ENDOSCOPY;  Service: Pulmonary;;  NO PAST SURGERIES    VIDEO BRONCHOSCOPY N/A 03/05/2021  Procedure: VIDEO BRONCHOSCOPY WITH FLUORO;  Surgeon: Candee Furbish, MD;  Location: [facility] ENDOSCOPY;  Service: Pulmonary;  Laterality: N/A; HPI: This 70 y.o. African American male nonsmoker presented to the [facility] Emergency Department via EMS with complaints of respiratory distress.  Per EMS, the patient was found to be hypoxic.  SpO2 73% despite non-rebreather mask. He was reportedly placed on CPAP and subsequently became unresponsive.  He was supported with bag-mask ventilation and transported to the ER.  In the ER, the patient has been placed on BiPAP.Of note, the patient was recently hospitalized for pneumonia and was discharged on 9/17 and was treated with cefepime/vancomycin which was switched to Unasyn.  He underwent bronch/BAL/TBBx/brushings 03/05/21. All unrevealing but no BAL cell count/diff obtained. MD concerned about potential aspiration as patient has had recurrent PNA's recently.  Subjective: pleasant, sitting EOB eating dinner  Assessment / Plan / Recommendation CH[facility] IP CLINICAL IMPRESSIONS 03/19/2021 Clinical Impression Pt presents with pharyngeal dysphagia characterized by reduced anterior laryngeal movement and a pharyngeal delay which resulted in penetration (PAS 3, 5) of thin and nectar thick liquids. Prompted coughing was ineffective in expelling penetrate. Various postural modifications were attempted and a chin tuck posture with right head turn proved most effective eliminating laryngeal invasion. Mild pyriform sinus residue was noted with liquids and this was cleared with pt's independent use of secondary swallows. Pt's pharyngeal timing and clearance were adequate for solids. A regular texture diet with thin liquids is recommended at this time with observance of swallowing precautions to reduce aspiration risk. SLP will follow for treatment. SLP Visit Diagnosis Dysphagia, pharyngeal phase (R13.13) Attention and concentration deficit following -- Frontal lobe and executive function deficit following -- Impact on safety and function Mild aspiration risk   CHL IP TREATMENT RECOMMENDATION 03/19/2021 Treatment Recommendations Therapy as outlined in treatment plan below   Prognosis 03/19/2021 Prognosis for Safe Diet Advancement Good Barriers to Reach Goals -- Barriers/Prognosis Comment -- CHL IP DIET RECOMMENDATION 03/19/2021 SLP Diet Recommendations Regular solids;Nectar thick liquid Liquid Administration via Cup;No straw Medication Administration Whole meds with liquid Compensations Slow rate;Small sips/bites;Chin tuck with right head turn, secondary swallows Postural Changes Seated upright at 90 degrees   CHL IP OTHER RECOMMENDATIONS 03/19/2021 Recommended Consults -- Oral Care Recommendations Oral care BID Other Recommendations --   CHL IP FOLLOW UP RECOMMENDATIONS 03/17/2021 Follow up Recommendations None   CHL IP FREQUENCY AND DURATION 03/19/2021 Speech Therapy Frequency (ACUTE ONLY) min 2x/week Treatment Duration 2 weeks      CHL IP  ORAL PHASE 03/19/2021 Oral Phase WFL Oral - Pudding Teaspoon -- Oral - Pudding Cup -- Oral - Honey Teaspoon -- Oral - Honey Cup -- Oral - Nectar Teaspoon -- Oral - Nectar Cup -- Oral - Nectar Straw -- Oral - Thin Teaspoon -- Oral - Thin Cup -- Oral - Thin Straw -- Oral - Puree -- Oral - Mech Soft -- Oral - Regular -- Oral - Multi-Consistency -- Oral - Pill -- Oral Phase - Comment --  CHL IP PHARYNGEAL PHASE 03/19/2021 Pharyngeal Phase Impaired Pharyngeal- Pudding Teaspoon -- Pharyngeal -- Pharyngeal- Pudding Cup -- Pharyngeal -- Pharyngeal- Honey Teaspoon -- Pharyngeal -- Pharyngeal- Honey Cup -- Pharyngeal -- Pharyngeal- Nectar Teaspoon -- Pharyngeal -- Pharyngeal- Nectar Cup Delayed swallow initiation-vallecula;Delayed swallow initiation-pyriform sinuses;Penetration/Aspiration during swallow;Reduced anterior laryngeal mobility;Pharyngeal residue - pyriform Pharyngeal Material enters airway, CONTACTS cords  and not ejected out Pharyngeal- Nectar Straw -- Pharyngeal -- Pharyngeal- Thin Teaspoon -- Pharyngeal -- Pharyngeal- Thin Cup Delayed swallow initiation-vallecula;Delayed swallow initiation-pyriform sinuses;Penetration/Aspiration during swallow;Reduced anterior laryngeal mobility;Pharyngeal residue - pyriform Pharyngeal Material enters airway, remains ABOVE vocal cords and not ejected out;Material enters airway, CONTACTS cords and not ejected out Pharyngeal- Thin Straw -- Pharyngeal -- Pharyngeal- Puree Reduced anterior laryngeal mobility Pharyngeal -- Pharyngeal- Mechanical Soft -- Pharyngeal -- Pharyngeal- Regular Reduced anterior laryngeal mobility Pharyngeal -- Pharyngeal- Multi-consistency -- Pharyngeal -- Pharyngeal- Pill Reduced anterior laryngeal mobility Pharyngeal -- Pharyngeal Comment --  CHL IP CERVICAL ESOPHAGEAL PHASE 03/19/2021 Cervical Esophageal Phase WFL Pudding Teaspoon -- Pudding Cup -- Honey Teaspoon -- Honey Cup -- Nectar Teaspoon -- Nectar Cup -- Nectar Straw -- Thin Teaspoon -- Thin Cup --  Thin Straw -- Puree -- Mechanical Soft -- Regular -- Multi-consistency -- Pill -- Cervical Esophageal Comment -- Shanika I. Hardin Negus, Time, [place] Office number [phone] Pager [phone] Horton Marshall 03/19/2021, 3:49 PM              ECHOCARDIOGRAM LIMITED BUBBLE STUDY  Result Date: 03/19/2021    ECHOCARDIOGRAM LIMITED REPORT   Patient Name:   CHRISTINO MCGLINCHEY Date of Exam: 03/19/2021 Medical Rec #:  [id number]     Height:       68.0 in Accession #:    [id number]    Weight:       165.1 lb Date of Birth:  05-Jul-1950      BSA:          1.884 m Patient Age:    57 years      BP:           198/81 mmHg Patient Gender: M             HR:           69 bpm. Exam Location:  Inpatient Procedure: Limited Echo Indications:    Hypoxia [300808]  History:        Patient has prior history of Echocardiogram examinations, most                 recent 01/30/2021. CHF and Hypertrophic Cardiomyopathy; Risk                 Factors:Diabetes, Dyslipidemia and Hypertension.  Sonographer:    Bernadene Person RDCS Referring Phys: [id number] [place]  1. Limited echo for shunting in the setting of hypoxia  2. Agitated saline contrast bubble study was positive with shunting observed after >6 cardiac cycles suggestive of intrapulmonary shunting. A moderate number of microbubbles were noted, consistent with a more significant shunt.  3. Left ventricular ejection fraction, by estimation, is 65 to 70%. The left ventricle has normal function. Comparison(s): Changes from prior study are noted. 01/30/2021: LVEF 70-75%. FINDINGS  Left Ventricle: Left ventricular ejection fraction, by estimation, is 65 to 70%. The left ventricle has normal function. IAS/Shunts: No atrial level shunt detected by color flow Doppler. Agitated saline contrast was given intravenously to evaluate for intracardiac shunting. Agitated saline contrast bubble study was positive with shunting observed after >6 cardiac cycles suggestive  of intrapulmonary shunting. Lyman Bishop MD Electronically signed by Lyman Bishop MD Signature Date/Time: 03/19/2021/3:31:33 PM    Final    VAS [place] LOWER EXTREMITY VENOUS (DVT)  Result Date: 03/17/2021  Lower Venous DVT Study Patient Name:  JUSTON GOHEEN  Date of Exam:   03/17/2021 Medical Rec #: [id number]  Accession #:    [id number] Date of Birth: 25-Oct-1950       Patient Gender: M Patient Age:   21 years Exam Location:  [facility] Procedure:      VAS [place] LOWER EXTREMITY VENOUS (DVT) Referring Phys: Renee Pain --------------------------------------------------------------------------------  Indications: Edema.  Limitations: Acoustic shadowing due to overlying calcification. Comparison Study: 01-14-2020 Bilateral lower extremity venous study was negative                   for DVT.                    01-08-2021 ABI w/ TBI showed bilateral absent DPA, moderate                   right arterial disease, mild left arterial disease. Performing Technologist: Darlin Coco RDMS, RVT  Examination Guidelines: A complete evaluation includes B-mode imaging, spectral Doppler, color Doppler, and power Doppler as needed of all accessible portions of each vessel. Bilateral testing is considered an integral part of a complete examination. Limited examinations for reoccurring indications may be performed as noted. The reflux portion of the exam is performed with the patient in reverse Trendelenburg.  +---------+---------------+---------+-----------+----------+--------------+ RIGHT    CompressibilityPhasicitySpontaneityPropertiesThrombus Aging +---------+---------------+---------+-----------+----------+--------------+ CFV      Full           Yes      Yes                                 +---------+---------------+---------+-----------+----------+--------------+ SFJ      Full                                                         +---------+---------------+---------+-----------+----------+--------------+ FV Prox  Full                                                        +---------+---------------+---------+-----------+----------+--------------+ FV Mid   Full                                                        +---------+---------------+---------+-----------+----------+--------------+ FV DistalFull                                                        +---------+---------------+---------+-----------+----------+--------------+ PFV      Full                                                        +---------+---------------+---------+-----------+----------+--------------+ POP      Full           Yes  Yes                                 +---------+---------------+---------+-----------+----------+--------------+ PTV      Full                                                        +---------+---------------+---------+-----------+----------+--------------+ PERO     Full                                                        +---------+---------------+---------+-----------+----------+--------------+ Gastroc  Full                                                        +---------+---------------+---------+-----------+----------+--------------+   +---------+---------------+---------+-----------+----------+--------------+ LEFT     CompressibilityPhasicitySpontaneityPropertiesThrombus Aging +---------+---------------+---------+-----------+----------+--------------+ CFV      Full           Yes      Yes                                 +---------+---------------+---------+-----------+----------+--------------+ SFJ      Full                                                        +---------+---------------+---------+-----------+----------+--------------+ FV Prox  Full                                                         +---------+---------------+---------+-----------+----------+--------------+ FV Mid   Full                                                        +---------+---------------+---------+-----------+----------+--------------+ FV DistalFull                                                        +---------+---------------+---------+-----------+----------+--------------+ PFV      Full                                                        +---------+---------------+---------+-----------+----------+--------------+ POP      Full  Yes      Yes                                 +---------+---------------+---------+-----------+----------+--------------+ PTV      Full                                                        +---------+---------------+---------+-----------+----------+--------------+ PERO     Full                                                        +---------+---------------+---------+-----------+----------+--------------+ Gastroc  Full                                                        +---------+---------------+---------+-----------+----------+--------------+     Summary: RIGHT: - There is no evidence of deep vein thrombosis in the lower extremity.  - No cystic structure found in the popliteal fossa.  LEFT: - There is no evidence of deep vein thrombosis in the lower extremity.  - No cystic structure found in the popliteal fossa.  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 *See table(s) above for measurements and observations. Electronically signed by Servando Snare MD on 03/17/2021 at 5:23:46 PM.    Final       IMPRESSION/PLAN: Today, I talked to the patient about the findings and work-up thus far.  We discussed the patient's diagnosis of rectal cancer and general treatment for this, acknowledging that he is not a candidate for systemic therapy or surgery given his significant medical history.  I highlighted the role of palliative  radiotherapy in the management.  We discussed the available radiation techniques, and focused on the details of logistics and delivery.     We discussed the risks, benefits, and side effects of radiotherapy.  I recommend a 2-week regimen of palliative radiation focused on the rectal tumor.  He understands that treatment would not be curative -rather, the goal would be to reduce his symptoms and slow down the growth of his tumor.  Sometimes radiation can be given a second time if patients' disease progresses months after initial course of palliative treatment.  We discussed that side effects may include but not necessarily be limited to: Skin irritation, local hair loss, fatigue, bladder irritation or injury, bowel/rectal irritation or injury, possible bowel obstruction, decreased fertility.  No guarantees of treatment were given. The patient was encouraged to ask questions that I answered to the best of my ability.  He is enthusiastic to proceed with treatment.  Treatment plan will take place tomorrow and our goal is to start treatment on October 20.  The patient anticipates transfer to [facility] to coordinate care.  I appreciate the coordination by his team to facilitate these treatments.  This encounter was provided by telemedicine platform; patient desired telemedicine during pandemic precautions.  MyChart video was not available and therefore telephone was used. The patient has given verbal consent for this type  of encounter and has been advised to only accept a meeting of this type in a secure network environment. On date of service, in total, I spent 30 minutes on this encounter.   The attendants for this meeting include Eppie Gibson  and [name] During the encounter, Eppie Gibson was located at [facility].  [name] was located at [facility]   __________________________________________   Eppie Gibson, MD  This  document serves as a record of services personally performed by Eppie Gibson, MD. It was created on her behalf by Roney Mans, a trained medical scribe. The creation of this record is based on the scribe's personal observations and the provider's statements to them. This document has been checked and approved by the attending provider.

## 2021-04-10 NOTE — Progress Notes (Signed)
Daily Progress Note   Patient Name: Robert Chaney       Date: 04/10/2021 DOB: 1950-08-21  Age: 70 y.o. MRN#: [id number] Attending Physician: Barb Merino, MD Primary Care Physician: [name], MD Admit Date: 04/01/2021  Reason for Consultation/Follow-up: Establishing goals of care and Psychosocial/spiritual support  Todays discussion   Detailed discussion had today regarding current medical situation; treatment options decision, advanced directive decisions and anticipatory care needs.  Was able to include patient's brother Robert Chaney by telephone and today's conversation.    Today patient is able to verbalize his understanding of his rectal cancer and suggested radiation treatments.  He is expecting a phone call from radiation oncology this afternoon.   He also is anticipating transfer to [facility] long hospital initiation of the radiation.    Reviewed conversation the patient had with medical oncology.  Education offered on his multiple comorbidities and their impact on viable systemic cancer treatments.    This entire hospitalization, today is day 9 has been overwhelming for Robert Chaney.  He plans to take it 1 day at a time and make decisions dependent on outcomes and offers.  I raised awareness and concern for his anticipatory care needs.  Patient lives at home with very limited social support.  His brother lives in [place] however he plans to come to [place] next Tuesday and stay with his brother for a month or two.   Education offered on the difference between aggressive medical intervention path and a palliative comfort path for this patient at this time in this situation.  Education on  hospice benefit offered.  Patient is able to verbalize today that he has no fear of dying,  however he wants to be assured that any pain or suffering will be addressed aggressively at the time of transition at EOL.   Emotional support offered.  Questions and concerns addressed. PMT will continue to support holistically.   Length of Stay: 9  Current Medications: Scheduled Meds:   sodium chloride   Intravenous Once   amLODipine  10 mg Oral Daily   atorvastatin  20 mg Oral Daily   bismuth subsalicylate  131 mg Oral TID AC & HS   brimonidine  1 drop Right Eye BID   carvedilol  25 mg Oral BID   docusate sodium  200 mg Oral  BID   dorzolamide  1 drop Both Eyes BID   furosemide  40 mg Oral Daily   hydrALAZINE  100 mg Oral TID   insulin aspart  0-9 Units Subcutaneous TID WC   insulin glargine-yfgn  6 Units Subcutaneous Daily   isosorbide mononitrate  60 mg Oral Daily   latanoprost  1 drop Left Eye QHS   metroNIDAZOLE  500 mg Oral TID   multivitamin  1 tablet Oral QHS   pantoprazole  40 mg Oral BID   Ensure Max Protein  11 oz Oral Daily   sodium chloride flush  3 mL Intravenous Q12H   tetracycline  500 mg Oral BID AC    Continuous Infusions:   PRN Meds: acetaminophen **OR** acetaminophen, bisacodyl, hydrALAZINE, nitroGLYCERIN, oxyCODONE, polyethylene glycol, sodium phosphate  Physical Exam Constitutional:      Appearance: He is overweight. He is ill-appearing.  Cardiovascular:     Rate and Rhythm: Normal rate.  Pulmonary:     Effort: Pulmonary effort is normal.  Musculoskeletal:     Comments: Generalized weakness  Neurological:     Mental Status: He is alert and oriented to person, place, and time.            Vital Signs: BP (!) 161/76 (BP Location: Left Arm)   Pulse 60   Temp 98.6 F (37 C) (Oral)   Resp 20   Ht 5\' 8"  (1.727 m)   Wt 75.8 kg   SpO2 96%   BMI 25.41 kg/m  SpO2: SpO2: 96 % O2 Device: O2 Device: High Flow Nasal Cannula O2 Flow Rate: O2 Flow Rate (L/min): 4 L/min  Intake/output summary:  Intake/Output Summary (Last 24 hours) at 04/10/2021  1338 Last data filed at 04/10/2021 1302 Gross per 24 hour  Intake 480 ml  Output 1900 ml  Net -1420 ml    LBM: Last BM Date: 04/10/21 Baseline Weight: Weight: 73.9 kg Most recent weight: Weight: 75.8 kg       Palliative Assessment/Data: 50 % currently      Patient Active Problem List   Diagnosis Date Noted   Rectal adenocarcinoma ([place])    Rectal bleeding    Malnutrition of moderate degree 04/03/2021   Chronic constipation    Hematochezia    Chronic posterior anal fissure    Helicobacter pylori gastritis    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HCC) 03/16/2021   Abnormal CXR 03/16/2021   Elevated brain natriuretic peptide (BNP) level 03/16/2021   Left thyroid nodule 03/16/2021   GI bleed 03/16/2021   Acute blood loss anemia 03/16/2021   Acute on chronic respiratory failure with hypoxia and hypercapnia (HCC) 02/28/2021   Melena 02/28/2021   Symptomatic anemia 02/28/2021   Syncope 01/30/2021   Acute on chronic respiratory failure with hypoxemia (HCC) 01/30/2021   Multifocal pneumonia 01/30/2021   Acute on chronic respiratory failure with hypoxia ([place]) 05/18/2020   Hypertensive crisis 02/18/2020   CHF exacerbation ([place]) 01/11/2020   Acute respiratory failure ([place]) 01/11/2020   Type 2 diabetes mellitus with retinopathy, with long-term current use of insulin ([place]) 07/16/2019   Type 2 diabetes mellitus with stage 4 chronic kidney disease, with long-term current use of insulin ([place]) 07/16/2019   Type 2 diabetes mellitus with diabetic polyneuropathy, with long-term current use of insulin ([place]) 07/16/2019   OSA (obstructive sleep apnea) 03/30/2019   Hyperglycemia 07/31/2017   Near syncope 07/31/2017   Acute-on-chronic kidney injury ([place]) 07/31/2017   AKI (acute kidney injury) ([place]) 06/09/2017   Hypertensive heart disease 11/07/2016  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[place]) 07/26/2015   Dyslipidemia 05/19/2013   Diabetic hyperosmolar non-ketotic state ([place]) 07/19/2011   DM  (diabetes mellitus) with complications ([place]) 27/08/5007   Stage 4 chronic kidney disease ([place]) 07/19/2011   Elevated troponin 07/19/2011   Prolonged QT interval 07/19/2011   Hyperkalemia 07/19/2011   Volume depletion 07/19/2011   Acute on chronic diastolic CHF (congestive heart failure) ([place]) 07/19/2011   Essential hypertension 07/19/2011   Elevated CPK 07/19/2011   Hypercholesterolemia 02/20/2011   Benign hypertensive heart disease without heart failure 02/20/2011    Palliative Care Assessment & Plan   Patient Profile: Palliative Care consult requested for goals of care discussion in this 70 y.o. male  with past medical history of diabetes, diastolic CHF, chronic hypoxic respiratory failure (4 L home oxygen), hypertension, hyperlipidemia, CKD stage IV, and glaucoma.    He was admitted on 04/01/2021 from home with shortness of breath.  Of note patient was hospitalized in September 2022 with acute on chronic respiratory failure/CHF exacerbation and hypertensive emergency requiring BiPAP and Lasix.  EGD and bronchoscopy negative during admission.  Cardiology following due to chest pain.  Patient initially started on nitroglycerin drip which has been weaned.    Patient also followed by gastroenterologist s/p recent colonoscopy which found a fungating ulcerative mass of the rectum.  Patient is requiring frequent blood transfusions.  Biopsy significant for colorectal adenocarcinoma.      Assessment: Patient facing treatment option decisions, advanced directive decisions and anticipatory care needs.  Recommendations/Plan: Plan is to transfer to [facility] long hospital for initiation of radiation therapy for his newly diagnosed colorectal adenocarcinoma PMT will continue to support and help patient navigate medical decisions throughout hospitalization Disposition pending  Code Status:    Code Status Orders  (From admission, onward)           Start     Ordered   04/01/21 1003  Do not  attempt resuscitation (DNR)  Continuous       Question Answer Comment  In the event of cardiac or respiratory ARREST Do not call a "code blue"   In the event of cardiac or respiratory ARREST Do not perform Intubation, CPR, defibrillation or ACLS   In the event of cardiac or respiratory ARREST Use medication by any route, position, wound care, and other measures to relive pain and suffering. May use oxygen, suction and manual treatment of airway obstruction as needed for comfort.      04/01/21 1020           Code Status History     Date Active Date Inactive Code Status Order ID Comments User Context   03/16/2021 2229 03/22/2021 2116 Full Code [id number]  Marcy Panning ED   02/28/2021 2327 03/10/2021 2054 DNR [id number]  Vianne Bulls, MD ED   01/30/2021 0626 02/01/2021 1933 DNR [id number]  Shela Leff, MD ED   01/30/2021 0617 01/30/2021 0626 DNR [id number]  Shela Leff, MD ED   04/27/2020 0642 05/13/2020 1831 Full Code [id number]  Vianne Bulls, MD ED   02/18/2020 1734 02/21/2020 2038 Full Code [id number]  Karmen Bongo, MD ED   01/13/2020 1738 01/16/2020 2211 Full Code [id number]  Alma Friendly, MD Inpatient   08/01/2017 0708 08/01/2017 1322 Full Code [id number]  Quintella Baton, MD ED   06/08/2017 1209 06/10/2017 1541 Full Code [id number]  Truett Mainland, DO Inpatient       Prognosis:  Unable to determine  Discharge Planning: To Be Determined  Care plan was  discussed with Dr Raelyn Mora  Thank you for allowing the Palliative Medicine Team to assist in the care of this patient.   Time In: 0930 Time Out: 1030 Total Time 60 minutes Prolonged Time Billed  no       Greater than 50%  of this time was spent counseling and coordinating care related to the above assessment and plan.  Wadie Lessen, NP  Please contact Palliative Medicine Team phone at [phone] for questions and concerns.

## 2021-04-10 NOTE — Plan of Care (Signed)
  Problem: Education: Goal: Knowledge of General Education information will improve Description: Including pain rating scale, medication(s)/side effects and non-pharmacologic comfort measures Outcome: Progressing   Problem: Health Behavior/Discharge Planning: Goal: Ability to manage health-related needs will improve Outcome: Progressing   Problem: Nutrition: Goal: Adequate nutrition will be maintained Outcome: Progressing   Problem: Coping: Goal: Level of anxiety will decrease Outcome: Progressing   Problem: Elimination: Goal: Will not experience complications related to bowel motility Outcome: Progressing   Problem: Pain Managment: Goal: General experience of comfort will improve Outcome: Progressing   Problem: Pain Managment: Goal: General experience of comfort will improve Outcome: Progressing

## 2021-04-10 NOTE — Progress Notes (Signed)
[facility] called and given report, will be arriving shortly and transport pt over to [facility].

## 2021-04-10 NOTE — Progress Notes (Addendum)
[facility] transport has left with pt at this time, on O2 and on their monitor, Central monitoring notified of pt off monitor here and transporting over to [facility] to room 1417, [place].

## 2021-04-10 NOTE — Progress Notes (Signed)
PT Cancellation Note  Patient Details Name: Robert Chaney MRN: [id number] DOB: 01-08-1951   Cancelled Treatment:    Reason Eval/Treat Not Completed: Patient at procedure or test/unavailable. RN asks that I do not see patient now. He is overwhelmed and has palliative care in room. Will check back later.     Kimmie Berggren 04/10/2021, 11:12 AM

## 2021-04-11 ENCOUNTER — Ambulatory Visit
Admit: 2021-04-11 | Discharge: 2021-04-11 | Disposition: A | Discharge status: Home / Self Care | Payer: PPO | Attending: Radiation Oncology | Admitting: Radiation Oncology

## 2021-04-11 ENCOUNTER — Ambulatory Visit: Payer: PPO | Admitting: Radiation Oncology

## 2021-04-11 DIAGNOSIS — C20 Malignant neoplasm of rectum: Secondary | ICD-10-CM | POA: Insufficient documentation

## 2021-04-11 DIAGNOSIS — E785 Hyperlipidemia, unspecified: Secondary | ICD-10-CM

## 2021-04-11 DIAGNOSIS — Z510 Encounter for antineoplastic radiation therapy: Secondary | ICD-10-CM | POA: Insufficient documentation

## 2021-04-11 LAB — TYPE AND SCREEN
ABO/RH(D): O POS
Antibody Screen: NEGATIVE
Unit division: 0
Unit division: 0
Unit division: 0

## 2021-04-11 LAB — BPAM RBC
Blood Product Expiration Date: 202211072359
Blood Product Expiration Date: 202211092359
Blood Product Expiration Date: 202211092359
ISSUE DATE / TIME: 202210151024
ISSUE DATE / TIME: 202210151311
Unit Type and Rh: 5100
Unit Type and Rh: 5100
Unit Type and Rh: 5100

## 2021-04-11 LAB — BASIC METABOLIC PANEL
Anion gap: 7 (ref 5–15)
BUN: 65 mg/dL — ABNORMAL HIGH (ref 8–23)
CO2: 21 mmol/L — ABNORMAL LOW (ref 22–32)
Calcium: 8.7 mg/dL — ABNORMAL LOW (ref 8.9–10.3)
Chloride: 108 mmol/L (ref 98–111)
Creatinine, Ser: 3.25 mg/dL — ABNORMAL HIGH (ref 0.61–1.24)
GFR, Estimated: 19 mL/min — ABNORMAL LOW (ref >60)
Glucose, Bld: 87 mg/dL (ref 70–99)
Potassium: 5 mmol/L (ref 3.5–5.1)
Sodium: 136 mmol/L (ref 135–145)

## 2021-04-11 LAB — CBC
HCT: 30.5 % — ABNORMAL LOW (ref 39.0–52.0)
Hemoglobin: 9.1 g/dL — ABNORMAL LOW (ref 13.0–17.0)
MCH: 28.9 pg (ref 26.0–34.0)
MCHC: 29.8 g/dL — ABNORMAL LOW (ref 30.0–36.0)
MCV: 96.8 fL (ref 80.0–100.0)
Platelets: 169 10*3/uL (ref 150–400)
RBC: 3.15 MIL/uL — ABNORMAL LOW (ref 4.22–5.81)
RDW: 16.1 % — ABNORMAL HIGH (ref 11.5–15.5)
WBC: 5.1 10*3/uL (ref 4.0–10.5)
nRBC: 0 % (ref 0.0–0.2)

## 2021-04-11 LAB — GLUCOSE, CAPILLARY
Glucose-Capillary: 82 mg/dL (ref 70–99)
Glucose-Capillary: 185 mg/dL — ABNORMAL HIGH (ref 70–99)
Glucose-Capillary: 172 mg/dL — ABNORMAL HIGH (ref 70–99)
Glucose-Capillary: 131 mg/dL — ABNORMAL HIGH (ref 70–99)

## 2021-04-11 NOTE — Plan of Care (Signed)

## 2021-04-11 NOTE — Progress Notes (Signed)
OT Cancellation Note  Patient Details Name: Robert Chaney MRN: [id number] DOB: [birth year]   Cancelled Treatment:    Reason Eval/Treat Not Completed: Patient declined, no reason specified patient was approached after radiation with family present in room. Patient reported not feeling well and declined to participate at this time. OT will continue to follow and check back another day.  Jackelyn Poling OTR/L, [place] [facility] Department Office# [phone] Pager# [phone]    04/11/2021, 3:18 PM

## 2021-04-11 NOTE — Progress Notes (Signed)
OT Cancellation Note  Patient Details Name: Robert Chaney MRN: [id number] DOB: 08/25/1950   Cancelled Treatment:    Reason Eval/Treat Not Completed: Patient at procedure or test/ unavailable nurse asking for therapy to check back later after radiation. OT will continue to follow and check back as schedule will allow.   [place] 04/11/2021, 11:53 AM

## 2021-04-11 NOTE — Progress Notes (Signed)
PROGRESS NOTE  Robert Chaney  VVO:[id number] DOB: 1950-11-13 DOA: 04/01/2021 PCP: [name], MD   Brief Narrative: Robert Chaney is a 70 y.o. male with a history of stage IV CKD, T2DM, chronic HFpEF, chronic hypoxic respiratory failure on 4L O2, HTN, HLD, recently diagnosed H. pylori gastritis who presented to the ED 10/9 with sudden onset of chest pain and shortness of breath requiring BiPAP, started on NTG gtt and diuresis on admission for recurrent acute HFpEF and treated for HCAP. Cardiology was consulted, NTG weaned, and pulmonary symptoms improved, returning to baseline with completion of antibiotics.    Ultimately underwent colonoscopy and found to have fungating ulcerative mass on the rectum.  Needing frequent blood transfusions.  Received total 3 units of PRBC on this hospitalization. 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  CT scan of the chest and MRI with contrast abdomen pelvis with no evidence of distant metastasis. Oncology/radiation oncology consulted, patient is deemed not a surgical candidate.  Transferring to [facility] long hospital for inpatient radiation therapy initiation.   Assessment & Plan: Principal Problem:   Acute on chronic diastolic CHF (congestive heart failure) (HCC) Active Problems:   DM (diabetes mellitus) with complications (HCC)   Stage 4 chronic kidney disease (HCC)   Hyperkalemia   Dyslipidemia   Hypertensive crisis   Multifocal pneumonia   Malnutrition of moderate degree   Rectal bleeding   Rectal adenocarcinoma (HCC)  Acute on chronic respiratory failure with hypoxia secondary to combination of acute on chronic diastolic heart failure and hypertensive urgency and healthcare associated pneumonia: Resolved, back to 4L O2.  - Completed course of antibiotics with vancomycin and cefepime.    H. pylori gastritis:  - Patient is on triple antibiotic therapy for H. pylori eradication.   Atypical chest pain: Had episode of  chest pain. No EKG changes.  Improved.  Troponins mildly elevated but less than previous.  No further work up currently planned.   Acute on chronic diastolic heart failure - Continue Lasix 40 mg daily today.  Takes 40 mg twice a day at home.   Anemia of chronic disease/?  Anemia of blood loss Baseline hemoglobin around 8, Received total 3 units of PRBC.  Recent upper GI endoscopy with gastritis positive for H. pylori.  Colorectal adenocarcinoma: CT chest with no evidence of obvious metastatic lesion.  MRI of the abdomen and pelvis with and without contrast with no evidence of distant metastasis. Patient has multiple comorbidities, seen by oncology.  He cannot undergo colorectal surgery due to extensive medical comorbidities.  Deemed not a surgical candidate.   Seen by radiation oncology, transferred to [facility] to initiate XRT as inpatient.    Acute on Stage IV CKD Creatinine at 2.3 on admission , increased to 3.4-3.3 today.  Euvolemic. Will closely monitor.  Potassium is adequate.  He does follow-up with nephrology as outpatient.   Elevated troponins felt to be from demand ischemia in the setting of hypertensive crisis and worsening anemia. IV heparin is discontinued.   Hypertensive urgency Patient was initially started on nitroglycerin gtt and has been weaned off. Blood pressure parameters have improved Patient currently on hydralazine 100 mg 3 times daily, Coreg 25 mg twice daily, amlodipine 10 mg daily.   Hyperlipidemia Continue with Lipitor 20 mg daily.   Chronic constipation Improved with bowel prep.     Diabetes mellitus with hyperglycemia Insulin-dependent.No changes currently planned.  - Semglee 6 units daily.  - Continue with SSI.    Moderate protein calorie malnutrition:  Dietary on board.    Continue to mobilize.  Patient is medically stabilizing.  Found to have rectal cancer in a patient with multiple comorbidities.   Patient will have difficulty coming to radiation  treatment from home, will start treatment in the hospital.  Transfer to [facility] today.  Moderate protein-calorie malnutrition:  - Supplement protein  DVT prophylaxis: SCDs Code Status: Full  Family Communication: None at bedside Disposition Plan:  Status is: Inpatient  Remains inpatient appropriate because: Need for XRT and inability to present for it if discharged  Consultants:  Radiation oncology Oncology Cardiology Palliative care  Procedures:  None  Antimicrobials: Tetracycline, flagyl Completed course of cefepime.   Subjective: Feels fine, no dyspnea. Does have rectal pain that is stable.   Objective: Vitals:   04/11/21 0500 04/11/21 0654 04/11/21 0700 04/11/21 1155  BP:  (!) 176/104  (!) 160/80  Pulse:  64  71  Resp:  17 16 15   Temp:  97.6 F (36.4 C)  98 F (36.7 C)  TempSrc:    Oral  SpO2:  100%  98%  Weight: 78.7 kg     Height: 5\' 8"  (1.727 m)       Intake/Output Summary (Last 24 hours) at 04/11/2021 1527 Last data filed at 04/11/2021 0950 Gross per 24 hour  Intake 900 ml  Output 1075 ml  Net -175 ml   Filed Weights   04/09/21 0255 04/10/21 0600 04/11/21 0500  Weight: 76.1 kg 75.8 kg 78.7 kg    Gen: 70 y.o. male in no distress Pulm: Non-labored breathing 4L O2. Clear to auscultation bilaterally.  CV: Regular rate and rhythm. No murmur, rub, or gallop. No JVD, no pitting pedal edema. GI: Abdomen soft, non-tender, non-distended, with normoactive bowel sounds. No organomegaly or masses felt. Ext: Warm, no deformities Skin: No rashes, lesions or ulcers on visualized skin Neuro: Alert and oriented. No focal neurological deficits. Psych: Judgement and insight appear normal. Mood & affect appropriate.   Data Reviewed: I have personally reviewed following labs and imaging studies  CBC: Recent Labs  Lab 04/07/21 0220 04/07/21 1932 04/08/21 0338 04/09/21 0417 04/10/21 0354 04/11/21 0440  WBC 4.8  --  5.6 4.4 4.8 5.1  HGB 6.9* 9.5* 9.3*  9.0* 9.7* 9.1*  HCT 23.0* 30.5* 30.5* 29.1* 31.6* 30.5*  MCV 94.7  --  93.6 94.8 94.3 96.8  PLT 201  --  201 174 169 195   Basic Metabolic Panel: Recent Labs  Lab 04/05/21 0201 04/06/21 0332 04/09/21 0417 04/10/21 0354 04/11/21 0440  NA 143 141 137 139 136  K 4.1 3.9 4.6 4.7 5.0  CL 110 109 109 110 108  CO2 25 24 23  21* 21*  GLUCOSE 147* 82 95 74 87  BUN 64* 52* 58* 63* 65*  CREATININE 2.76* 2.81* 3.40* 3.33* 3.25*  CALCIUM 8.5* 8.7* 8.8* 8.9 8.7*  MG  --   --  2.3  --   --    GFR: Estimated Creatinine Clearance: 19.9 mL/min (A) (by C-G formula based on SCr of 3.25 mg/dL (H)). Liver Function Tests: No results for input(s): AST, ALT, ALKPHOS, BILITOT, PROT, ALBUMIN in the last 168 hours. No results for input(s): LIPASE, AMYLASE in the last 168 hours. No results for input(s): AMMONIA in the last 168 hours. Coagulation Profile: No results for input(s): INR, PROTIME in the last 168 hours. Cardiac Enzymes: No results for input(s): CKTOTAL, CKMB, CKMBINDEX, TROPONINI in the last 168 hours. BNP (last 3 results) No results for input(s): PROBNP  in the last 8760 hours. HbA1C: No results for input(s): HGBA1C in the last 72 hours. CBG: Recent Labs  Lab 04/10/21 1128 04/10/21 1605 04/10/21 2137 04/11/21 0733 04/11/21 1151  GLUCAP 180* 145* 148* 82 185*   Lipid Profile: No results for input(s): CHOL, HDL, LDLCALC, TRIG, CHOLHDL, LDLDIRECT in the last 72 hours. Thyroid Function Tests: No results for input(s): TSH, T4TOTAL, FREET4, T3FREE, THYROIDAB in the last 72 hours. Anemia Panel: No results for input(s): VITAMINB12, FOLATE, FERRITIN, TIBC, IRON, RETICCTPCT in the last 72 hours. Urine analysis:    Component Value Date/Time   COLORURINE YELLOW 03/01/2021 0029   APPEARANCEUR CLEAR 03/01/2021 0029   LABSPEC 1.011 03/01/2021 0029   PHURINE 5.0 03/01/2021 0029   GLUCOSEU NEGATIVE 03/01/2021 0029   HGBUR NEGATIVE 03/01/2021 0029   BILIRUBINUR NEGATIVE 03/01/2021 0029    KETONESUR NEGATIVE 03/01/2021 0029   PROTEINUR 30 (A) 03/01/2021 0029   UROBILINOGEN 1.0 07/19/2011 1335   NITRITE NEGATIVE 03/01/2021 0029   LEUKOCYTESUR MODERATE (A) 03/01/2021 0029   No results found for this or any previous visit (from the past 240 hour(s)).    Radiology Studies: No results found.  Scheduled Meds:  sodium chloride   Intravenous Once   amLODipine  10 mg Oral Daily   atorvastatin  20 mg Oral Daily   bismuth subsalicylate  446 mg Oral TID AC & HS   brimonidine  1 drop Right Eye BID   carvedilol  25 mg Oral BID   docusate sodium  200 mg Oral BID   dorzolamide  1 drop Both Eyes BID   furosemide  40 mg Oral Daily   hydrALAZINE  100 mg Oral TID   insulin aspart  0-9 Units Subcutaneous TID WC   insulin glargine-yfgn  6 Units Subcutaneous Daily   isosorbide mononitrate  60 mg Oral Daily   latanoprost  1 drop Left Eye QHS   metroNIDAZOLE  500 mg Oral TID   multivitamin  1 tablet Oral QHS   pantoprazole  40 mg Oral BID   sodium chloride flush  3 mL Intravenous Q12H   tetracycline  500 mg Oral BID AC   Continuous Infusions:   LOS: 10 days   Time spent: 25 minutes.  Patrecia Pour, MD [facility] [email or web address] 04/11/2021, 3:27 PM

## 2021-04-11 NOTE — TOC Initial Note (Signed)
Transition of Care [facility]) - Initial/Assessment Note    Patient Details  Name: Robert Chaney MRN: [id number] Date of Birth: 17-Sep-1950  Transition [facility] CM/SW Contact:    Dessa Phi, RN Phone Number: 04/11/2021, 12:38 PM  Clinical Narrative: Spoke to patient's cousin per permission [place] about d/c plans-Home;active w/[facility] home 02-she will check on full travel tank to bring to hospital-informed that if ot full we can have [employer] to deliver home 02 travel tank to hospital @ d/c.Family can transport home on own.                  Expected Discharge Plan: Home/Self Care Barriers to Discharge: Continued Medical Work up   Patient Goals and CMS Choice Patient states their goals for this hospitalization and ongoing recovery are:: return home CMS Medicare.gov Compare Post Acute Care list provided to:: Patient Represenative (must comment) ([place](cousin)[phone]) Choice offered to / list presented to : Adult Children  Expected Discharge Plan and Services Expected Discharge Plan: Home/Self Care   Discharge Planning Services: CM Consult Post Acute Care Choice: NA Living arrangements for the past 2 months: Single Family Home                                      Prior Living Arrangements/Services Living arrangements for the past 2 months: Single Family Home Lives with:: Self Patient language and need for interpreter reviewed:: Yes Do you feel safe going back to the place where you live?: Yes      Need for Family Participation in Patient Care: Yes (Comment) Care giver support system in place?: Yes (comment) Current home services: DME ([facility] home 02) Criminal Activity/Legal Involvement Pertinent to Current Situation/Hospitalization: No - Comment as needed  Activities of Daily Living Home Assistive Devices/Equipment: CPAP, Eyeglasses, Walker (specify type) (reading glasses) ADL Screening (condition at time of admission) Patient's cognitive  ability adequate to safely complete daily activities?: Yes Is the patient deaf or have difficulty hearing?: No Does the patient have difficulty seeing, even when wearing glasses/contacts?: No Does the patient have difficulty concentrating, remembering, or making decisions?: No Patient able to express need for assistance with ADLs?: Yes Does the patient have difficulty dressing or bathing?: Yes Independently performs ADLs?: Yes (appropriate for developmental age) Does the patient have difficulty walking or climbing stairs?: Yes Weakness of Legs: Both Weakness of Arms/Hands: None  Permission Sought/Granted Permission sought to share information with : Case Manager Permission granted to share information with : Yes, Verbal Permission Granted  Share Information with NAME: Case Manager     Permission granted to share info w Relationship: [place] cousin [phone]     Emotional Assessment Appearance:: Appears stated age Attitude/Demeanor/Rapport: Gracious Affect (typically observed): Accepting Orientation: : Oriented to Self, Oriented to Place, Oriented to  Time, Oriented to Situation Alcohol / Substance Use: Not Applicable Psych Involvement: No (comment)  Admission diagnosis:  Hypertrophic cardiomyopathy (HCC) [I42.2] NSTEMI (non-ST elevated myocardial infarction) ([place]) [I21.4] Acute on chronic diastolic CHF (congestive heart failure) (HCC) [I50.33] Acute on chronic respiratory failure with hypoxia (HCC) [J96.21] Acute on chronic congestive heart failure, unspecified heart failure type [facility]) [I50.9] Patient Active Problem List   Diagnosis Date Noted   Rectal adenocarcinoma ([place])    Rectal bleeding    Malnutrition of moderate degree 04/03/2021   Chronic constipation    Hematochezia    Chronic posterior anal fissure  Helicobacter pylori gastritis    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[place]) 03/16/2021   Abnormal CXR 03/16/2021   Elevated brain natriuretic peptide (BNP) level 03/16/2021    Left thyroid nodule 03/16/2021   GI bleed 03/16/2021   Acute blood loss anemia 03/16/2021   Acute on chronic respiratory failure with hypoxia and hypercapnia (HCC) 02/28/2021   Melena 02/28/2021   Symptomatic anemia 02/28/2021   Syncope 01/30/2021   Acute on chronic respiratory failure with hypoxemia ([place]) 01/30/2021   Multifocal pneumonia 01/30/2021   Acute on chronic respiratory failure with hypoxia ([place]) 05/19/2020   Hypertensive crisis 02/18/2020   CHF exacerbation ([place]) 01/11/2020   Acute respiratory failure ([place]) 01/11/2020   Type 2 diabetes mellitus with retinopathy, with long-term current use of insulin ([place]) 07/16/2019   Type 2 diabetes mellitus with stage 4 chronic kidney disease, with long-term current use of insulin ([place]) 07/16/2019   Type 2 diabetes mellitus with diabetic polyneuropathy, with long-term current use of insulin ([place]) 07/16/2019   OSA (obstructive sleep apnea) 03/30/2019   Hyperglycemia 07/31/2017   Near syncope 07/31/2017   Acute-on-chronic kidney injury ([place]) 07/31/2017   AKI (acute kidney injury) ([place]) 06/09/2017   Hypertensive heart disease 11/07/2016   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[place]) 07/26/2015   Dyslipidemia 05/19/2013   Diabetic hyperosmolar non-ketotic state ([place]) 07/19/2011   DM (diabetes mellitus) with complications ([place]) 97/94/8016   Stage 4 chronic kidney disease ([place]) 07/19/2011   Elevated troponin 07/19/2011   Prolonged QT interval 07/19/2011   Hyperkalemia 07/19/2011   Volume depletion 07/19/2011   Acute on chronic diastolic CHF (congestive heart failure) ([place]) 07/19/2011   Essential hypertension 07/19/2011   Elevated CPK 07/19/2011   Hypercholesterolemia 02/20/2011   Benign hypertensive heart disease without heart failure 02/20/2011   PCP:  [name], MD Pharmacy:   [facility] [address] [place] [address] Phone: [phone] Fax:  [phone]  [facility] [place], [address] [place] [address] Phone: [phone] Fax: [phone]  [facility] (New Address) - [place], [place] AT Previously: [address], [address] [place] [address] Phone: [phone] Fax: [phone]  [facility] [address] [address] Phone: [phone] Fax: [phone]     Social Determinants of Health (SDOH) Interventions    Readmission Risk Interventions Readmission Risk Prevention Plan 04/11/2021 03/22/2021 03/05/2021  Transportation Screening Complete Complete Complete  PCP or Specialist Appt within 3-5 Days - - Complete  HRI or [place] - - Complete  Social Work Consult for [place] Planning/Counseling - - Complete  Palliative Care Screening - - Not Applicable  Medication Review Press photographer) Complete Complete Complete  PCP or Specialist appointment within 3-5 days of discharge Complete Complete -  [place] or Home Care Consult Complete Complete -  SW Recovery Care/Counseling Consult Complete Complete -  Palliative Care Screening Not Applicable Not Applicable -  [place] Not Applicable Not Applicable -  Some recent data might be hidden

## 2021-04-12 ENCOUNTER — Ambulatory Visit: Payer: PPO

## 2021-04-12 ENCOUNTER — Ambulatory Visit
Admit: 2021-04-12 | Discharge: 2021-04-12 | Disposition: A | Discharge status: Home / Self Care | Payer: PPO | Attending: Radiation Oncology | Admitting: Radiation Oncology

## 2021-04-12 LAB — GLUCOSE, CAPILLARY
Glucose-Capillary: 97 mg/dL (ref 70–99)
Glucose-Capillary: 199 mg/dL — ABNORMAL HIGH (ref 70–99)
Glucose-Capillary: 136 mg/dL — ABNORMAL HIGH (ref 70–99)
Glucose-Capillary: 111 mg/dL — ABNORMAL HIGH (ref 70–99)

## 2021-04-12 MED ORDER — OXYCODONE HCL 5 MG PO TABS
7.5000 mg | ORAL_TABLET | ORAL | Status: DC | PRN
  Administered 2021-04-12 – 2021-04-14 (×6): 7.5 mg via ORAL
  Filled 2021-04-12 (×6): qty 2

## 2021-04-12 NOTE — Plan of Care (Signed)

## 2021-04-12 NOTE — Progress Notes (Signed)
Occupational Therapy Treatment Patient Details Name: Robert Chaney MRN: [id number] DOB: 04-02-1951 Today's Date: 04/12/2021   History of present illness Pt is a 70 y/o male who presented with SOB and chest pain.Pt required BiPAP and nitroglycerin drip, admitted for further workup. Pt recently admitted 9/23-9/29 due to acute on chronic respiratory failure w/ CHF and hypertensive urgency. PMH: CHF, DM, CKD, HTN, PNA. Patient with recently diagnosed colorectal cancer   OT comments  Limited session today due to BLE pain and somnolence, but began addressing goal for pt to receive education on energy conservation. Pt was found EOB but falling alssp and leaning to RT, although able to wake self up before losing balance. For safety, pt was assisted to recliner with supervision and use of RW. LEs were elevated and pt supported with pillows for safety.  Pt reports main limitation to independence as pain this visit. PT reports that his RN and MD are aware and adjust meds.    Recommendations for follow up therapy are one component of a multi-disciplinary discharge planning process, led by the attending physician.  Recommendations may be updated based on patient status, additional functional criteria and insurance authorization.    Follow Up Recommendations  No OT follow up    Equipment Recommendations  None recommended by OT    Recommendations for Other Services      Precautions / Restrictions Precautions Precautions: Fall Precaution Comments: monitor O2, wears 4L O2 baseline Restrictions Weight Bearing Restrictions: No       Mobility Bed Mobility               General bed mobility comments: patient received sitting up on side of bed, falling asleep and leaning to RT but woke self up before therapy needed to intervene    Transfers Overall transfer level: Modified independent Equipment used: Rolling walker (2 wheeled) Transfers: Sit to/from Stand Sit to Stand: Supervision          General transfer comment: Supervision for safety due to somnolence and LE pain, no LOB. Pt pivoted from EOB to recliner to aloow elevation of BLEs for pain mngt.    Balance Overall balance assessment: Modified Independent Sitting-balance support: Feet supported Sitting balance-Leahy Scale: Good     Standing balance support: Bilateral upper extremity supported;During functional activity Standing balance-Leahy Scale: Good                             ADL either performed or assessed with clinical judgement   ADL                                               Vision       Perception     Praxis      Cognition Arousal/Alertness: Lethargic Behavior During Therapy: WFL for tasks assessed/performed Overall Cognitive Status: Within Functional Limits for tasks assessed                                          Exercises Other Exercises Other Exercises: HAndout on energy conservation issued and breifly discussed however pt not in good situation for education due to somnolence and pain. Pt was encouraged to read the handout when able and OT will reinforce concepts  at later session.   Shoulder Instructions       General Comments      Pertinent Vitals/ Pain       Pain Assessment: 0-10 Pain Score: 10-Worst pain ever Pain Location: left leg at hip and lesser so to RT leg. Pt reports that his RN and MD are aware and are adjusting meds. Pain Descriptors / Indicators: Aching Pain Intervention(s): Monitored during session;Repositioned;Limited activity within patient's tolerance  Home Living                                          Prior Functioning/Environment              Frequency  Min 2X/week        Progress Toward Goals  OT Goals(current goals can now be found in the care plan section)  Progress towards OT goals: Progressing toward goals  Acute Rehab OT Goals Patient Stated Goal: Decreased pain to  Lt hip. OT Goal Formulation: With patient Time For Goal Achievement: 04/17/21 Potential to Achieve Goals: Good  Plan Discharge plan remains appropriate    Co-evaluation                 AM-PAC OT "6 Clicks" Daily Activity     Outcome Measure   Help from another person eating meals?: None Help from another person taking care of personal grooming?: None Help from another person toileting, which includes using toliet, bedpan, or urinal?: A Little Help from another person bathing (including washing, rinsing, drying)?: A Little Help from another person to put on and taking off regular upper body clothing?: A Little Help from another person to put on and taking off regular lower body clothing?: A Little 6 Click Score: 20    End of Session Equipment Utilized During Treatment: Oxygen  OT Visit Diagnosis: Other abnormalities of gait and mobility (R26.89)   Activity Tolerance Patient tolerated treatment well   Patient Left with call bell/phone within reach;in chair   Nurse Communication          Time: 5916-3846 OT Time Calculation (min): 12 min  Charges: OT General Charges $OT Visit: 1 Visit OT Treatments $Therapeutic Activity: 8-22 mins  Robert Chaney, OT Acute Rehab Services Office: [phone] 04/12/2021  Robert Chaney 04/12/2021, 10:16 AM

## 2021-04-12 NOTE — Progress Notes (Signed)
PROGRESS NOTE  Robert Chaney  BXU:[id number] DOB: 1951-03-30 DOA: 04/01/2021 PCP: [name], MD   Brief Narrative: Robert Chaney is a 70 y.o. male with a history of stage IV CKD, T2DM, chronic HFpEF, chronic hypoxic respiratory failure on 4L O2, HTN, HLD, recently diagnosed H. pylori gastritis who presented to the ED 10/9 with sudden onset of chest pain and shortness of breath requiring BiPAP, started on NTG gtt and diuresis on admission for recurrent acute HFpEF and treated for HCAP. Cardiology was consulted, NTG weaned, and pulmonary symptoms improved, returning to baseline with completion of antibiotics.    Ultimately underwent colonoscopy and found to have fungating ulcerative mass on the rectum.  Needing frequent blood transfusions.  Received total 3 units of PRBC on this hospitalization. 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  CT scan of the chest and MRI with contrast abdomen pelvis with no evidence of distant metastasis. Oncology/radiation oncology consulted, patient is deemed not a surgical candidate.  Transferring to [facility] for inpatient radiation therapy initiation.   Assessment & Plan: Principal Problem:   Acute on chronic diastolic CHF (congestive heart failure) (HCC) Active Problems:   DM (diabetes mellitus) with complications (HCC)   Stage 4 chronic kidney disease (HCC)   Hyperkalemia   Dyslipidemia   Hypertensive crisis   Multifocal pneumonia   Malnutrition of moderate degree   Rectal bleeding   Rectal adenocarcinoma (HCC)  Acute on chronic respiratory failure with hypoxia secondary to combination of acute on chronic diastolic heart failure and hypertensive urgency and healthcare associated pneumonia: Resolved, back to 4L O2.  - Completed course of antibiotics with vancomycin and cefepime.    H. pylori gastritis:  - Patient is on triple antibiotic therapy for H. pylori eradication.   Atypical chest pain: Had episode of  chest pain. No EKG changes.  Improved.  Troponins mildly elevated but less than previous.  No further work up currently planned.   Acute on chronic diastolic heart failure - Continue Lasix 40 mg daily today.  Takes 40 mg twice a day at home.   Anemia of chronic disease/?  Anemia of blood loss Baseline hemoglobin around 8. Recheck in AM. Received total 3 units of PRBC.  Recent upper GI endoscopy with gastritis positive for H. pylori.  Colorectal adenocarcinoma: CT chest with no evidence of obvious metastatic lesion.  MRI of the abdomen and pelvis with and without contrast with no evidence of distant metastasis. Patient has multiple comorbidities, seen by oncology.  He cannot undergo colorectal surgery due to extensive medical comorbidities.  Deemed not a surgical candidate.   - Continue XRT as inpatient as pt is unable to return for this as outpatient and has severe symptoms. - Pain incompletely controlled on oxycodone 5mg  dosing, will increase to 7.5mg  dosing q4h prn, continue tylenol, avoid NSAIDs. Monitor for adequate analgesia and sedation.  Acute on Stage IV CKD: Creatinine stabilizing. Euvolemic. - Avoid nephtoxins, will need continued nephrology follow up. Recheck BMP in AM.   Elevated troponins felt to be from demand ischemia in the setting of hypertensive crisis and worsening anemia. IV heparin is discontinued.   Hypertensive urgency: Patient was initially started on nitroglycerin gtt and has been weaned off. - Continue hydralazine 100 mg 3 times daily, Coreg 25 mg twice daily, amlodipine 10 mg daily.   Hyperlipidemia: Continue with Lipitor 20 mg daily.   Chronic constipation: Improved with bowel prep.   IDT2DM: At inpatient goal. - Semglee 6 units daily.  - Continue  with SSI.    Moderate protein calorie malnutrition: Dietary on board.    Continue to mobilize.  Patient is medically stabilizing.  Found to have rectal cancer in a patient with multiple comorbidities.    Patient will have difficulty coming to radiation treatment from home, will start treatment in the hospital.  Transfer to [facility] today.  Moderate protein-calorie malnutrition:  - Supplement protein  DVT prophylaxis: SCDs Code Status: Full  Family Communication: None at bedside Disposition Plan:  Status is: Inpatient  Remains inpatient appropriate because: Need for XRT and inability to present for it if discharged  Consultants:  Radiation oncology Oncology Cardiology Palliative care  Procedures:  None  Antimicrobials: Tetracycline, flagyl Completed course of cefepime.   Subjective: Pain in rectum and left hip are >10/10 at their worst, worse with movements and improved to moderate with oxycodone 5mg . No sedation reported. No dyspnea or cough or fever. Tolerated radiation teaching.  Objective: Vitals:   04/11/21 1951 04/11/21 2200 04/12/21 0015 04/12/21 0446  BP: (!) 155/69   (!) 148/67  Pulse: 67   62  Resp: 14 16  16   Temp: 98.2 F (36.8 C)   98 F (36.7 C)  TempSrc: Oral   Oral  SpO2: 100%   98%  Weight:   79.4 kg   Height:        Intake/Output Summary (Last 24 hours) at 04/12/2021 0815 Last data filed at 04/12/2021 0446 Gross per 24 hour  Intake 1200 ml  Output 1650 ml  Net -450 ml   Filed Weights   04/10/21 0600 04/11/21 0500 04/12/21 0015  Weight: 75.8 kg 78.7 kg 79.4 kg   Gen: 70 y.o. male in no distress Pulm: Nonlabored breathing 4L O2 on BSC. Clear. CV: Regular rate and rhythm. No murmur, rub, or gallop. No JVD, no pitting dependent edema. GI: Abdomen soft, non-tender, non-distended, with normoactive bowel sounds.  Ext: Warm, no deformities, pain with left hip ROM. Skin: No new rashes, lesions or ulcers on visualized skin. Neuro: Alert and oriented. No focal neurological deficits. Psych: Judgement and insight appear fair. Mood euthymic & affect congruent. Behavior is appropriate.    Data Reviewed: I have personally reviewed following labs  and imaging studies  CBC: Recent Labs  Lab 04/07/21 0220 04/07/21 1932 04/08/21 0338 04/09/21 0417 04/10/21 0354 04/11/21 0440  WBC 4.8  --  5.6 4.4 4.8 5.1  HGB 6.9* 9.5* 9.3* 9.0* 9.7* 9.1*  HCT 23.0* 30.5* 30.5* 29.1* 31.6* 30.5*  MCV 94.7  --  93.6 94.8 94.3 96.8  PLT 201  --  201 174 169 789   Basic Metabolic Panel: Recent Labs  Lab 04/06/21 0332 04/09/21 0417 04/10/21 0354 04/11/21 0440  NA 141 137 139 136  K 3.9 4.6 4.7 5.0  CL 109 109 110 108  CO2 24 23 21* 21*  GLUCOSE 82 95 74 87  BUN 52* 58* 63* 65*  CREATININE 2.81* 3.40* 3.33* 3.25*  CALCIUM 8.7* 8.8* 8.9 8.7*  MG  --  2.3  --   --    GFR: Estimated Creatinine Clearance: 19.9 mL/min (A) (by C-G formula based on SCr of 3.25 mg/dL (H)). Liver Function Tests: No results for input(s): AST, ALT, ALKPHOS, BILITOT, PROT, ALBUMIN in the last 168 hours. No results for input(s): LIPASE, AMYLASE in the last 168 hours. No results for input(s): AMMONIA in the last 168 hours. Coagulation Profile: No results for input(s): INR, PROTIME in the last 168 hours. Cardiac Enzymes: No results for input(s):  CKTOTAL, CKMB, CKMBINDEX, TROPONINI in the last 168 hours. BNP (last 3 results) No results for input(s): PROBNP in the last 8760 hours. HbA1C: No results for input(s): HGBA1C in the last 72 hours. CBG: Recent Labs  Lab 04/11/21 0733 04/11/21 1151 04/11/21 1642 04/11/21 2218 04/12/21 0725  GLUCAP 82 185* 172* 131* 97   Lipid Profile: No results for input(s): CHOL, HDL, LDLCALC, TRIG, CHOLHDL, LDLDIRECT in the last 72 hours. Thyroid Function Tests: No results for input(s): TSH, T4TOTAL, FREET4, T3FREE, THYROIDAB in the last 72 hours. Anemia Panel: No results for input(s): VITAMINB12, FOLATE, FERRITIN, TIBC, IRON, RETICCTPCT in the last 72 hours. Urine analysis:    Component Value Date/Time   COLORURINE YELLOW 03/01/2021 0029   APPEARANCEUR CLEAR 03/01/2021 0029   LABSPEC 1.011 03/01/2021 0029   PHURINE 5.0  03/01/2021 0029   GLUCOSEU NEGATIVE 03/01/2021 0029   HGBUR NEGATIVE 03/01/2021 0029   BILIRUBINUR NEGATIVE 03/01/2021 0029   KETONESUR NEGATIVE 03/01/2021 0029   PROTEINUR 30 (A) 03/01/2021 0029   UROBILINOGEN 1.0 07/19/2011 1335   NITRITE NEGATIVE 03/01/2021 0029   LEUKOCYTESUR MODERATE (A) 03/01/2021 0029   No results found for this or any previous visit (from the past 240 hour(s)).    Radiology Studies: No results found.  Scheduled Meds:  sodium chloride   Intravenous Once   amLODipine  10 mg Oral Daily   atorvastatin  20 mg Oral Daily   bismuth subsalicylate  468 mg Oral TID AC & HS   brimonidine  1 drop Right Eye BID   carvedilol  25 mg Oral BID   docusate sodium  200 mg Oral BID   dorzolamide  1 drop Both Eyes BID   furosemide  40 mg Oral Daily   hydrALAZINE  100 mg Oral TID   insulin aspart  0-9 Units Subcutaneous TID WC   insulin glargine-yfgn  6 Units Subcutaneous Daily   isosorbide mononitrate  60 mg Oral Daily   latanoprost  1 drop Left Eye QHS   metroNIDAZOLE  500 mg Oral TID   multivitamin  1 tablet Oral QHS   pantoprazole  40 mg Oral BID   sodium chloride flush  3 mL Intravenous Q12H   tetracycline  500 mg Oral BID AC   Continuous Infusions:   LOS: 11 days   Time spent: 25 minutes.  Patrecia Pour, MD [facility] [email or web address] 04/12/2021, 8:15 AM

## 2021-04-12 NOTE — Care Management Important Message (Signed)
Important Message  Patient Details IM Letter given to the Patient. Name: Robert Chaney MRN: [id number] Date of Birth: [birth year]   Medicare Important Message Given:  Yes     Kerin Salen 04/12/2021, 12:06 PM

## 2021-04-12 NOTE — Progress Notes (Addendum)
Physical Therapy Treatment Patient Details Name: Robert Chaney MRN: [id number] DOB: 15-Jan-1951 Today's Date: 04/12/2021   History of Present Illness Pt is a 70 y/o male who presented with SOB and chest pain.Pt required BiPAP and nitroglycerin drip, admitted for further workup. Pt recently admitted 9/23-9/29 due to acute on chronic respiratory failure w/ CHF and hypertensive urgency. PMH: CHF, DM, CKD, HTN, PNA. Patient with recently diagnosed colorectal cancer    PT Comments     Pt lethargic and sliding down in chair when PT arrived to room. Pt alerted and able to reposition with min assist. Pt agreeable to limited exercise this session remaining in room. Ambulated ~35' with RW and min guard for safety, VSS throughout. Educated on functional LE strengthening in standing and repeated sit<>stands. Pt ended session in recliner and repositioned with pillows to prevent sliding. Acute PT will continue to progress pt as able. Recommendations updated to HHPT follow up.    Recommendations for follow up therapy are one component of a multi-disciplinary discharge planning process, led by the attending physician.  Recommendations may be updated based on patient status, additional functional criteria and insurance authorization.  Follow Up Recommendations  Home health PT     Equipment Recommendations  None recommended by PT    Recommendations for Other Services       Precautions / Restrictions Precautions Precautions: Fall Precaution Comments: monitor O2, wears 4L O2 baseline Restrictions Weight Bearing Restrictions: No     Mobility  Bed Mobility               General bed mobility comments: pt OOB in recliner    Transfers Overall transfer level: Needs assistance Equipment used: Rolling walker (2 wheeled) Transfers: Sit to/from Stand Sit to Stand: Supervision;Min guard         General transfer comment: min guard/supervision for safety with sit<>stand from recliner. pt required  bil UE on RW for power up, shoulder ROM limited and prevented press up from armrests. pt performed repeated sit<>stands for exercise.  Ambulation/Gait Ambulation/Gait assistance: Min guard Gait Distance (Feet): 35 Feet Assistive device: Rolling walker (2 wheeled) Gait Pattern/deviations: Step-through pattern;Decreased stride length Gait velocity: decr   General Gait Details: pt amb short distance in room making 2 loops with RW for support and guarding for safety. assist needed for O2 line management.   Stairs             Wheelchair Mobility    Modified Rankin (Stroke Patients Only)       Balance Overall balance assessment: Needs assistance Sitting-balance support: Feet supported Sitting balance-Leahy Scale: Fair     Standing balance support: Bilateral upper extremity supported;During functional activity Standing balance-Leahy Scale: Fair                              Cognition Arousal/Alertness: Lethargic Behavior During Therapy: WFL for tasks assessed/performed Overall Cognitive Status: No family/caregiver present to determine baseline cognitive functioning                                 General Comments: pt lethargic and irritable but willing to participate in therapy.      Exercises Other Exercises Other Exercises: 2x5 reps Sit<>Stand with bil UE use for power up. Other Exercises: 10 reps bil LE heel raises in standing with RW support. Other Exercises: 10 reps bil LE marching with UE support at  RW.    General Comments        Pertinent Vitals/Pain Pain Assessment: Faces Faces Pain Scale: Hurts a little bit Pain Location: bil toe pain Pain Descriptors / Indicators: Aching Pain Intervention(s): Limited activity within patient's tolerance;Monitored during session;Repositioned    Home Living                      Prior Function            PT Goals (current goals can now be found in the care plan section) Acute Rehab  PT Goals PT Goal Formulation: With patient Time For Goal Achievement: 04/17/21 Potential to Achieve Goals: Good Progress towards PT goals: Progressing toward goals    Frequency    Min 3X/week      PT Plan Discharge plan needs to be updated    Co-evaluation              AM-PAC PT "6 Clicks" Mobility   Outcome Measure  Help needed turning from your back to your side while in a flat bed without using bedrails?: A Little Help needed moving from lying on your back to sitting on the side of a flat bed without using bedrails?: A Little Help needed moving to and from a bed to a chair (including a wheelchair)?: A Little Help needed standing up from a chair using your arms (e.g., wheelchair or bedside chair)?: A Little Help needed to walk in hospital room?: A Little Help needed climbing 3-5 steps with a railing? : A Lot 6 Click Score: 17    End of Session Equipment Utilized During Treatment: Gait belt Activity Tolerance: Patient tolerated treatment well Patient left: in chair;with call bell/phone within reach;with chair alarm set Nurse Communication: Mobility status PT Visit Diagnosis: Difficulty in walking, not elsewhere classified (R26.2)     Time: 0354-6568 PT Time Calculation (min) (ACUTE ONLY): 18 min  Charges:  $Therapeutic Exercise: 8-22 mins                     Robert Chaney, DPT Acute Rehabilitation Services Office [phone] Pager [phone]    Robert Chaney 04/12/2021, 4:54 PM

## 2021-04-13 ENCOUNTER — Encounter (HOSPITAL_COMMUNITY): Payer: Self-pay | Admitting: Internal Medicine

## 2021-04-13 ENCOUNTER — Ambulatory Visit
Admit: 2021-04-13 | Discharge: 2021-04-13 | Disposition: A | Discharge status: Home / Self Care | Payer: PPO | Attending: Radiation Oncology | Admitting: Radiation Oncology

## 2021-04-13 LAB — CBC
HCT: 30.7 % — ABNORMAL LOW (ref 39.0–52.0)
Hemoglobin: 9.1 g/dL — ABNORMAL LOW (ref 13.0–17.0)
MCH: 29 pg (ref 26.0–34.0)
MCHC: 29.6 g/dL — ABNORMAL LOW (ref 30.0–36.0)
MCV: 97.8 fL (ref 80.0–100.0)
Platelets: 189 10*3/uL (ref 150–400)
RBC: 3.14 MIL/uL — ABNORMAL LOW (ref 4.22–5.81)
RDW: 16.4 % — ABNORMAL HIGH (ref 11.5–15.5)
WBC: 5.9 10*3/uL (ref 4.0–10.5)
nRBC: 0 % (ref 0.0–0.2)

## 2021-04-13 LAB — BASIC METABOLIC PANEL
Anion gap: 7 (ref 5–15)
BUN: 64 mg/dL — ABNORMAL HIGH (ref 8–23)
CO2: 19 mmol/L — ABNORMAL LOW (ref 22–32)
Calcium: 8.6 mg/dL — ABNORMAL LOW (ref 8.9–10.3)
Chloride: 106 mmol/L (ref 98–111)
Creatinine, Ser: 3.19 mg/dL — ABNORMAL HIGH (ref 0.61–1.24)
GFR, Estimated: 20 mL/min — ABNORMAL LOW (ref >60)
Glucose, Bld: 79 mg/dL (ref 70–99)
Potassium: 5.1 mmol/L (ref 3.5–5.1)
Sodium: 132 mmol/L — ABNORMAL LOW (ref 135–145)

## 2021-04-13 LAB — GLUCOSE, CAPILLARY
Glucose-Capillary: 85 mg/dL (ref 70–99)
Glucose-Capillary: 200 mg/dL — ABNORMAL HIGH (ref 70–99)
Glucose-Capillary: 120 mg/dL — ABNORMAL HIGH (ref 70–99)

## 2021-04-13 NOTE — Progress Notes (Signed)
Daily Progress Note   Patient Name: Robert Chaney       Date: 04/13/2021 DOB: [birth year]  Age: 70 y.o. MRN#: [id number] Attending Physician: Little Ishikawa, MD Primary Care Physician: [name], MD Admit Date: 04/01/2021  Reason for Consultation/Follow-up: Establishing goals of care and Psychosocial/spiritual support  Chart reviewed including personal review of pertinent labs and imaging.  I met today with Robert Chaney.  We discussed his pain management.  He reports that he still has significant pain, particularly in his legs and at night.  Pain medication was increased to oxycodone 7.5 mg and he reports that this has been sufficient to relieve his pain.  He currently reports adequate pain control.  He tells me that he is tolerating radiation well to this point in time.  He has no other specific complaints.  We discussed long-term goals of care and he endorsed plan to continue with radiation but not pursue any surgical or chemotherapy intervention.  We talked about CODE STATUS and limitations of care.  He reports that initially he had established DNR in the event of cardiac or respiratory arrest, however, after discussing further with his family he changed his mind and is once again full code.  Discussed continuing to consider what his long-term hopes are and pursuing interventions that are likely to get him closer to this goal while also considering limitations of care (such as foregoing CPR) if they are not likely to result in him getting well enough to leave the hospital again.  We also discussed surrogate decision making.  He has 2 brothers who currently be his legal surrogates.  Discussed healthcare power of attorney documentation in case he would like to name one of them to be  primary healthcare power of attorney.  He will consider this as well.  We also didLength of Stay: 12  Current Medications: Scheduled Meds:   sodium chloride   Intravenous Once   amLODipine  10 mg Oral Daily   atorvastatin  20 mg Oral Daily   bismuth subsalicylate  417 mg Oral TID AC & HS   brimonidine  1 drop Right Eye BID   carvedilol  25 mg Oral BID   docusate sodium  200 mg Oral BID   dorzolamide  1 drop Both Eyes BID   furosemide  40 mg Oral  Daily   hydrALAZINE  100 mg Oral TID   insulin aspart  0-9 Units Subcutaneous TID WC   insulin glargine-yfgn  6 Units Subcutaneous Daily   isosorbide mononitrate  60 mg Oral Daily   latanoprost  1 drop Left Eye QHS   metroNIDAZOLE  500 mg Oral TID   multivitamin  1 tablet Oral QHS   pantoprazole  40 mg Oral BID   sodium chloride flush  3 mL Intravenous Q12H   tetracycline  500 mg Oral BID AC    Continuous Infusions:   PRN Meds: acetaminophen **OR** acetaminophen, bisacodyl, hydrALAZINE, nitroGLYCERIN, oxyCODONE, polyethylene glycol, sodium phosphate  Physical Exam Constitutional:      Appearance: He is overweight. He is ill-appearing.  Cardiovascular:     Rate and Rhythm: Normal rate.  Pulmonary:     Effort: Pulmonary effort is normal.  Musculoskeletal:     Comments: Generalized weakness  Neurological:     Mental Status: He is alert and oriented to person, place, and time.            Vital Signs: BP (!) 165/74 (BP Location: Left Arm)   Pulse 62   Temp 98.2 F (36.8 C) (Oral)   Resp 18   Ht '5\' 8"'  (1.727 m)   Wt 79.4 kg   SpO2 100%   BMI 26.62 kg/m  SpO2: SpO2: 100 % O2 Device: O2 Device: Nasal Cannula O2 Flow Rate: O2 Flow Rate (L/min): 6 L/min  Intake/output summary:  Intake/Output Summary (Last 24 hours) at 04/13/2021 1440 Last data filed at 04/13/2021 1004 Gross per 24 hour  Intake 480 ml  Output 1500 ml  Net -1020 ml    LBM: Last BM Date: 04/12/21 Baseline Weight: Weight: 73.9 kg Most recent weight:  Weight: 79.4 kg       Palliative Assessment/Data: 50 % currently      Patient Active Problem List   Diagnosis Date Noted   Rectal adenocarcinoma ([place])    Rectal bleeding    Malnutrition of moderate degree 04/03/2021   Chronic constipation    Hematochezia    Chronic posterior anal fissure    Helicobacter pylori gastritis    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HCC) 03/16/2021   Abnormal CXR 03/16/2021   Elevated brain natriuretic peptide (BNP) level 03/16/2021   Left thyroid nodule 03/16/2021   GI bleed 03/16/2021   Acute blood loss anemia 03/16/2021   Acute on chronic respiratory failure with hypoxia and hypercapnia (HCC) 02/28/2021   Melena 02/28/2021   Symptomatic anemia 02/28/2021   Syncope 01/30/2021   Acute on chronic respiratory failure with hypoxemia ([place]) 01/30/2021   Multifocal pneumonia 01/30/2021   Acute on chronic respiratory failure with hypoxia ([place]) 04/27/2020   Hypertensive crisis 02/18/2020   CHF exacerbation ([place]) 01/11/2020   Acute respiratory failure ([place]) 01/11/2020   Type 2 diabetes mellitus with retinopathy, with long-term current use of insulin ([place]) 07/16/2019   Type 2 diabetes mellitus with stage 4 chronic kidney disease, with long-term current use of insulin ([place]) 07/16/2019   Type 2 diabetes mellitus with diabetic polyneuropathy, with long-term current use of insulin ([place]) 07/16/2019   OSA (obstructive sleep apnea) 03/30/2019   Hyperglycemia 07/31/2017   Near syncope 07/31/2017   Acute-on-chronic kidney injury ([place]) 07/31/2017   AKI (acute kidney injury) ([place]) 06/09/2017   Hypertensive heart disease 11/07/2016   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[place]) 07/26/2015   Dyslipidemia 05/19/2013   Diabetic hyperosmolar non-ketotic state ([place]) 07/19/2011   DM (diabetes mellitus) with complications ([place]) 81/44/8185  Stage 4 chronic kidney disease ([place]) 07/19/2011   Elevated troponin 07/19/2011   Prolonged QT interval 07/19/2011   Hyperkalemia  07/19/2011   Volume depletion 07/19/2011   Acute on chronic diastolic CHF (congestive heart failure) ([place]) 07/19/2011   Essential hypertension 07/19/2011   Elevated CPK 07/19/2011   Hypercholesterolemia 02/20/2011   Benign hypertensive heart disease without heart failure 02/20/2011    Palliative Care Assessment & Plan   Patient Profile: Palliative Care consult requested for goals of care discussion in this 70 y.o. male  with past medical history of diabetes, diastolic CHF, chronic hypoxic respiratory failure (4 L home oxygen), hypertension, hyperlipidemia, CKD stage IV, and glaucoma.  He is currently undergoing radiation therapy for newly diagnosed rectal cancer. Assessment: Patient facing treatment option decisions, advanced directive decisions and anticipatory care needs.  Recommendations/Plan: Continue radiation therapy for his newly diagnosed colorectal adenocarcinoma Currently full code/full scope.  This has been discussed in the past and previously limitation was established of DNR/DNI.  He then changed his mind regarding this after further discussion with family.  His brother is coming into town next week and it may be worthwhile to have another family meeting to discuss further once his brother arrives in town.  Code Status:    Code Status Orders  (From admission, onward)           Start     Ordered   04/01/21 1003  Do not attempt resuscitation (DNR)  Continuous       Question Answer Comment  In the event of cardiac or respiratory ARREST Do not call a "code blue"   In the event of cardiac or respiratory ARREST Do not perform Intubation, CPR, defibrillation or ACLS   In the event of cardiac or respiratory ARREST Use medication by any route, position, wound care, and other measures to relive pain and suffering. May use oxygen, suction and manual treatment of airway obstruction as needed for comfort.      04/01/21 1020           Code Status History     Date Active  Date Inactive Code Status Order ID Comments User Context   03/16/2021 2229 03/22/2021 2116 Full Code [id number]  Marcy Panning ED   02/28/2021 2327 03/10/2021 2054 DNR [id number]  Vianne Bulls, MD ED   01/30/2021 0626 02/01/2021 1933 DNR [id number]  Shela Leff, MD ED   01/30/2021 0617 01/30/2021 0626 DNR [id number]  Shela Leff, MD ED   05/20/2020 0642 05/13/2020 1831 Full Code [id number]  Vianne Bulls, MD ED   02/18/2020 1734 02/21/2020 2038 Full Code [id number]  Karmen Bongo, MD ED   01/13/2020 1738 01/16/2020 2211 Full Code [id number]  Alma Friendly, MD Inpatient   08/01/2017 0708 08/01/2017 1322 Full Code [id number]  Quintella Baton, MD ED   06/08/2017 1209 06/10/2017 1541 Full Code [id number]  Truett Mainland, DO Inpatient       Prognosis:  Unable to determine  Discharge Planning: To Be Determined  Care plan was discussed with patient  Thank you for allowing the Palliative Medicine Team to assist in the care of this patient.   Total Time 40 minutes Prolonged Time Billed  no    Greater than 50%  of this time was spent counseling and coordinating care related to the above assessment and plan.   Micheline Rough, MD  Please contact Palliative Medicine Team phone at [phone] for questions and concerns.

## 2021-04-13 NOTE — Progress Notes (Signed)
   04/13/21 1600  Clinical Encounter Type  Visited With Patient  Visit Type Initial  Referral From Nurse  Consult/Referral To Chaplain  Spiritual Encounters  Spiritual Needs Other (Comment) (Advanced Directives follow up)  Stress Factors  Patient Stress Factors None identified  Family Stress Factors None identified  Chaplain made two attempts this afternoon to visit with patient for follow up regarding possible AD. Will continue to attempt spiritual care visit for patient.   Rev. Mammie Lorenzo, M. Divinity, BCCC, BCPC, CFC

## 2021-04-13 NOTE — Progress Notes (Signed)
PROGRESS NOTE  Robert Chaney  MWU:[id number] DOB: Oct 25, 1950 DOA: 04/01/2021 PCP: [name], MD   Brief Narrative: Robert Chaney is a 70 y.o. male with a history of stage IV CKD, T2DM, chronic HFpEF, chronic hypoxic respiratory failure on 4L O2, HTN, HLD, recently diagnosed H. pylori gastritis who presented to the ED 10/9 with sudden onset of chest pain and shortness of breath requiring BiPAP, started on NTG gtt and diuresis on admission for recurrent acute HFpEF and treated for HCAP. Cardiology was consulted, NTG weaned, and pulmonary symptoms improved, returning to baseline with completion of antibiotics.    Ultimately underwent colonoscopy and found to have fungating ulcerative mass on the rectum.  Needing frequent blood transfusions.  Received total 3 units of PRBC on this hospitalization. 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  CT scan of the chest and MRI with contrast abdomen pelvis with no evidence of distant metastasis. Oncology/radiation oncology consulted, patient is deemed not a surgical candidate.  Transferring to [facility] for inpatient radiation therapy initiation.  Assessment & Plan:  Acute on chronic respiratory failure with hypoxia secondary to combination of acute on chronic diastolic heart failure and hypertensive urgency and healthcare associated pneumonia: Resolved, back to 4L O2 -which is his baseline.  - Completed course of antibiotics with vancomycin and cefepime.    H. pylori gastritis:  - Patient is on triple antibiotic therapy for H. pylori eradication.   Atypical chest pain: Had episode of chest pain. No EKG changes.  Improved.  Troponins mildly elevated but less than previous.  No further work up currently planned.   Acute on chronic diastolic heart failure: - Continue Lasix 40 mg daily today.  Takes 40 mg twice a day at home.   Anemia of chronic disease with concurrent acute anemia of blood loss Baseline hemoglobin  around 8.  Currently stabilizing around 9 status post 3 unit PRBC since admission Recent upper GI endoscopy with gastritis positive for H. pylori.  Colorectal adenocarcinoma:  - CT chest with no evidence of obvious metastatic lesion.  MRI of the abdomen and pelvis with and without contrast with no evidence of distant metastasis. - Patient has multiple comorbidities, seen by oncology.  He cannot undergo colorectal surgery due to extensive medical comorbidities.  Deemed not a surgical candidate.   - Continue XRT as inpatient as pt is unable to return for this as outpatient and has severe symptoms. Plan for 2-week regimen of palliative radiation focused on the rectal tumor.  He understands that treatment would not be curative -rather, the goal would be to reduce his symptoms and slow down the growth of his tumor. - Pain currently well controlled on oxycodone 7.5mg  q4h prn, continue tylenol, avoid NSAIDs. Monitor for adequate analgesia and sedation.  Acute on Stage IV CKD: Creatinine stabilizing. Euvolemic. - Avoid nephtoxins, will need continued nephrology follow up. Recheck BMP in AM.   Elevated troponin - Felt to be from demand ischemia in the setting of hypertensive crisis and worsening anemia IV heparin is discontinued.   Hypertensive urgency:  - Patient was initially started on nitroglycerin gtt and has been weaned off. - Continue hydralazine 100 mg 3 times daily, Coreg 25 mg twice daily, amlodipine 10 mg daily.   Hyperlipidemia: Continue with Lipitor 20 mg daily.   Chronic constipation: Improved with bowel prep.   IDT2DM:  - At inpatient goal. - Semglee 6 units daily.  - Continue with SSI.    Moderate protein calorie malnutrition: Dietary on board.  DVT prophylaxis: SCDs Code Status: Full  Family Communication: None at bedside Disposition Plan:  Status is: Inpatient  Remains inpatient appropriate because: Need for XRT and inability to present for it if  discharged  Consultants:  Radiation oncology Oncology Cardiology Palliative care  Procedures:  None  Antimicrobials: Tetracycline, flagyl x14 days Completed course of cefepime.   Subjective: Pain currently well controlled denies nausea vomiting diarrhea constipation headache fevers chills or chest pain  Objective: Vitals:   04/12/21 0446 04/12/21 1245 04/12/21 2047 04/13/21 0451  BP: (!) 148/67 (!) 158/76 (!) 146/59 (!) 147/65  Pulse: 62 61 (!) 57 61  Resp: 16 18 17 16   Temp: 98 F (36.7 C) (!) 97.4 F (36.3 C) 97.9 F (36.6 C) 98 F (36.7 C)  TempSrc: Oral Oral Oral Oral  SpO2: 98% 94% 97% 96%  Weight:      Height:        Intake/Output Summary (Last 24 hours) at 04/13/2021 0741 Last data filed at 04/13/2021 0400 Gross per 24 hour  Intake 240 ml  Output 850 ml  Net -610 ml    Filed Weights   04/10/21 0600 04/11/21 0500 04/12/21 0015  Weight: 75.8 kg 78.7 kg 79.4 kg   Gen: 70 y.o. male in no distress Pulm: Nonlabored breathing 4L O2 on BSC. Clear. CV: Regular rate and rhythm. No murmur, rub, or gallop. No JVD, no pitting dependent edema. GI: Abdomen soft, non-tender, non-distended, with normoactive bowel sounds.  Ext: Warm, no deformities, pain with left hip ROM. Skin: No new rashes, lesions or ulcers on visualized skin. Neuro: Alert and oriented. No focal neurological deficits. Psych: Judgement and insight appear fair. Mood euthymic & affect congruent. Behavior is appropriate.    Data Reviewed: I have personally reviewed following labs and imaging studies  CBC: Recent Labs  Lab 04/08/21 0338 04/09/21 0417 04/10/21 0354 04/11/21 0440 04/13/21 0440  WBC 5.6 4.4 4.8 5.1 5.9  HGB 9.3* 9.0* 9.7* 9.1* 9.1*  HCT 30.5* 29.1* 31.6* 30.5* 30.7*  MCV 93.6 94.8 94.3 96.8 97.8  PLT 201 174 169 169 413    Basic Metabolic Panel: Recent Labs  Lab 04/09/21 0417 04/10/21 0354 04/11/21 0440 04/13/21 0440  NA 137 139 136 132*  K 4.6 4.7 5.0 5.1  CL 109  110 108 106  CO2 23 21* 21* 19*  GLUCOSE 95 74 87 79  BUN 58* 63* 65* 64*  CREATININE 3.40* 3.33* 3.25* 3.19*  CALCIUM 8.8* 8.9 8.7* 8.6*  MG 2.3  --   --   --     GFR: Estimated Creatinine Clearance: 20.3 mL/min (A) (by C-G formula based on SCr of 3.19 mg/dL (H)). Liver Function Tests: No results for input(s): AST, ALT, ALKPHOS, BILITOT, PROT, ALBUMIN in the last 168 hours. No results for input(s): LIPASE, AMYLASE in the last 168 hours. No results for input(s): AMMONIA in the last 168 hours. Coagulation Profile: No results for input(s): INR, PROTIME in the last 168 hours. Cardiac Enzymes: No results for input(s): CKTOTAL, CKMB, CKMBINDEX, TROPONINI in the last 168 hours. BNP (last 3 results) No results for input(s): PROBNP in the last 8760 hours. HbA1C: No results for input(s): HGBA1C in the last 72 hours. CBG: Recent Labs  Lab 04/12/21 0725 04/12/21 1109 04/12/21 1652 04/12/21 2046 04/13/21 0729  GLUCAP 97 199* 136* 111* 85    Lipid Profile: No results for input(s): CHOL, HDL, LDLCALC, TRIG, CHOLHDL, LDLDIRECT in the last 72 hours. Thyroid Function Tests: No results for input(s): TSH, T4TOTAL,  FREET4, T3FREE, THYROIDAB in the last 72 hours. Anemia Panel: No results for input(s): VITAMINB12, FOLATE, FERRITIN, TIBC, IRON, RETICCTPCT in the last 72 hours. Urine analysis:    Component Value Date/Time   COLORURINE YELLOW 03/01/2021 0029   APPEARANCEUR CLEAR 03/01/2021 0029   LABSPEC 1.011 03/01/2021 0029   PHURINE 5.0 03/01/2021 0029   GLUCOSEU NEGATIVE 03/01/2021 0029   HGBUR NEGATIVE 03/01/2021 0029   BILIRUBINUR NEGATIVE 03/01/2021 0029   KETONESUR NEGATIVE 03/01/2021 0029   PROTEINUR 30 (A) 03/01/2021 0029   UROBILINOGEN 1.0 07/19/2011 1335   NITRITE NEGATIVE 03/01/2021 0029   LEUKOCYTESUR MODERATE (A) 03/01/2021 0029   No results found for this or any previous visit (from the past 240 hour(s)).    Radiology Studies: No results found.  Scheduled Meds:   sodium chloride   Intravenous Once   amLODipine  10 mg Oral Daily   atorvastatin  20 mg Oral Daily   bismuth subsalicylate  144 mg Oral TID AC & HS   brimonidine  1 drop Right Eye BID   carvedilol  25 mg Oral BID   docusate sodium  200 mg Oral BID   dorzolamide  1 drop Both Eyes BID   furosemide  40 mg Oral Daily   hydrALAZINE  100 mg Oral TID   insulin aspart  0-9 Units Subcutaneous TID WC   insulin glargine-yfgn  6 Units Subcutaneous Daily   isosorbide mononitrate  60 mg Oral Daily   latanoprost  1 drop Left Eye QHS   metroNIDAZOLE  500 mg Oral TID   multivitamin  1 tablet Oral QHS   pantoprazole  40 mg Oral BID   sodium chloride flush  3 mL Intravenous Q12H   tetracycline  500 mg Oral BID AC   Continuous Infusions:   LOS: 12 days   Time spent: 25 minutes.  Little Ishikawa, DO [facility] [email or web address] 04/13/2021, 7:41 AM

## 2021-04-14 LAB — GLUCOSE, CAPILLARY
Glucose-Capillary: 73 mg/dL (ref 70–99)
Glucose-Capillary: 207 mg/dL — ABNORMAL HIGH (ref 70–99)

## 2021-04-14 MED ORDER — TETRACYCLINE HCL 500 MG PO CAPS: 500.0000 mg | ORAL_CAPSULE | Freq: Two times a day (BID) | ORAL | 0 refills | Status: AC

## 2021-04-14 MED ORDER — CARVEDILOL 25 MG PO TABS: 25.0000 mg | ORAL_TABLET | Freq: Two times a day (BID) | ORAL | 0 refills | Status: AC

## 2021-04-14 MED ORDER — METRONIDAZOLE 500 MG PO TABS: 500.0000 mg | ORAL_TABLET | Freq: Three times a day (TID) | ORAL | 0 refills | Status: AC

## 2021-04-14 MED ORDER — OXYCODONE HCL 7.5 MG PO TABS: 7.5000 mg | ORAL_TABLET | ORAL | 0 refills | Status: DC | PRN

## 2021-04-14 NOTE — TOC Transition Note (Signed)
Transition of Care [facility]) - CM/SW Discharge Note   Patient Details  Name: Robert Chaney MRN: [id number] Date of Birth: Apr 03, 1951  Transition of Care [facility]) CM/SW Contact:  Robert Ludwig, LCSW Phone Number: 04/14/2021, 1:16 PM   Clinical Narrative:     CSW was informed that patient is concerned about how he is going to get to his radiation appointment.  CSW advised that he speak to the social worker at the cancer center and they can help make arrangements to make sure he gets to his appointments.  Patient has his own oxygen tanks, TOC case manager Robert Chaney spoke to patient's family and reminded them to bring his portable tanks when he is picked up.  If he does not have any let TOC know so we can get [employer] to bring one for him.  CSW signing off, no other anticipated needs.     Final next level of care: Home/Self Care Barriers to Discharge: Continued Medical Work up   Patient Goals and CMS Choice Patient states their goals for this hospitalization and ongoing recovery are:: return home CMS Medicare.gov Compare Post Acute Care list provided to:: Patient Represenative (must comment) (Robert Chaney(cousin)[phone]) Choice offered to / list presented to : Adult Children  Discharge Placement                       Discharge Plan and Services   Discharge Planning Services: CM Consult Post Acute Care Choice: NA                               Social Determinants of Health (SDOH) Interventions     Readmission Risk Interventions Readmission Risk Prevention Plan 04/11/2021 03/22/2021 03/05/2021  Transportation Screening Complete Complete Complete  PCP or Specialist Appt within 3-5 Days - - Complete  HRI or [place] - - Complete  Social Work Consult for [place] Planning/Counseling - - Complete  Palliative Care Screening - - Not Applicable  Medication Review Press photographer) Complete Complete Complete  PCP or Specialist appointment within 3-5 days of  discharge Complete Complete -  [place] or Home Care Consult Complete Complete -  SW Recovery Care/Counseling Consult Complete Complete -  Palliative Care Screening Not Applicable Not Applicable -  [place] Not Applicable Not Applicable -  Some recent data might be hidden

## 2021-04-14 NOTE — Discharge Summary (Signed)
Physician Discharge Summary  Robert Chaney:[id number] DOB: Nov 15, 1950 DOA: 04/01/2021  PCP: [name], MD  Admit date: 04/01/2021 Discharge date: 04/14/2021  Admitted From: Home Disposition: Home  Recommendations for Outpatient Follow-up:  Follow up with PCP in 1-2 weeks Please obtain BMP/CBC in one week Please follow up with oncology, radiation oncology as scheduled, next radiation appointment 04/16/2021:  Discharge Condition: Stable CODE STATUS: Full Diet recommendation: As tolerated  Brief/Interim Summary: Robert Chaney is a 70 y.o. male with a history of stage IV CKD, T2DM, chronic HFpEF, chronic hypoxic respiratory failure on 4L O2, HTN, HLD, recently diagnosed H. pylori gastritis who presented to the ED 10/9 with sudden onset of chest pain and shortness of breath requiring BiPAP, started on NTG gtt and diuresis on admission for recurrent acute HFpEF and treated for HCAP. Cardiology was consulted, NTG weaned, and pulmonary symptoms improved, returning to baseline with completion of antibiotics.    Ultimately underwent colonoscopy and found to have fungating ulcerative mass on the rectum.  Needing frequent blood transfusions.  Received total 3 units of PRBC on this hospitalization. 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  CT scan of the chest and MRI with contrast abdomen pelvis with no evidence of distant metastasis. Oncology/radiation oncology consulted, patient is deemed not a surgical candidate.  Transferring to [facility] hospital for inpatient radiation therapy initiation.  Patient is tolerated initiation of radiation with radiation oncology, at this time is otherwise medically stable and back to baseline, otherwise requesting discharge home for outpatient radiation follow-up which is certainly reasonable.  Confirmed with radiation oncology today with Dr. Isidore Moos, family to assist patient with transport to radiation.  Discharge Diagnoses:   Acute  on chronic respiratory failure with hypoxia secondary to combination of acute on chronic diastolic heart failure and hypertensive urgency and healthcare associated pneumonia: Resolved, back to 4L O2 -which is his baseline.  - Completed course of antibiotics with vancomycin and cefepime.     H. pylori gastritis:  - Patient is on triple antibiotic therapy for H. pylori eradication -to complete 04/16/2021    Acute on chronic diastolic heart failure: -Transition to once daily Lasix at discharge, close follow-up with cardiology   Anemia of chronic disease with concurrent acute anemia of blood loss Baseline hemoglobin around 8.  Currently stabilizing around 9 status post 3 unit PRBC since admission Recent upper GI endoscopy with gastritis positive for H. pylori.   Colorectal adenocarcinoma:  - CT chest with no evidence of obvious metastatic lesion.  MRI of the abdomen and pelvis with and without contrast with no evidence of distant metastasis. - Patient has multiple comorbidities, seen by oncology.  He cannot undergo colorectal surgery due to extensive medical comorbidities.  Deemed not a surgical candidate.   - Continue XRT as outpatient, given resolution of symptoms and family to assist with transport patient is otherwise agreeable for discharge    Acute on Stage IV CKD: Creatinine stabilizing. Euvolemic. - Avoid nephtoxins, will need continued nephrology follow up. Recheck BMP in AM.   Elevated troponin - Felt to be from demand ischemia in the setting of hypertensive crisis and worsening anemia IV heparin is discontinued.   Hypertensive urgency:  - Patient was initially started on nitroglycerin gtt and has been weaned off. - Continue hydralazine 100 mg 3 times daily, Coreg 25 mg twice daily, amlodipine 10 mg daily.   Hyperlipidemia: Continue with Lipitor 20 mg daily.   Chronic constipation: Improved with bowel prep.   IDT2DM:  -Currently  at goal, continue home regimen, diabetic  diet   Moderate protein calorie malnutrition: Dietary on board.     Discharge Instructions  Discharge Instructions     Call MD for:  persistant nausea and vomiting   Complete by: As directed    Call MD for:  severe uncontrolled pain   Complete by: As directed    Call MD for:  temperature >100.4   Complete by: As directed    Diet - low sodium heart healthy   Complete by: As directed    Increase activity slowly   Complete by: As directed       Allergies as of 04/14/2021   No Known Allergies      Medication List     STOP taking these medications    aspirin EC 81 MG tablet   senna-docusate 8.6-50 MG tablet Commonly known as: Senokot-S       TAKE these medications    amLODipine 10 MG tablet Commonly known as: NORVASC Take 1 tablet (10 mg total) by mouth daily.   atorvastatin 20 MG tablet Commonly known as: LIPITOR Take 1 tablet (20 mg total) by mouth daily.   brimonidine 0.2 % ophthalmic solution Commonly known as: ALPHAGAN Place 1 drop into the right eye 2 (two) times daily.   carvedilol 25 MG tablet Commonly known as: COREG Take 1 tablet (25 mg total) by mouth 2 (two) times daily. What changed:  medication strength how much to take   cholecalciferol 25 MCG (1000 UNIT) tablet Commonly known as: VITAMIN D3 Take 1,000 Units by mouth daily.   Dexcom G6 Sensor Misc 1 Device by Does not apply route as directed.   Dexcom G6 Transmitter Misc 1 Device by Does not apply route as directed.   docusate sodium 100 MG capsule Commonly known as: COLACE Take 2 capsules (200 mg total) by mouth 2 (two) times daily.   dorzolamide 2 % ophthalmic solution Commonly known as: TRUSOPT Place 1 drop into both eyes 2 (two) times daily.   furosemide 40 MG tablet Commonly known as: LASIX Take 1 tablet (40 mg total) by mouth 2 (two) times daily.   hydrALAZINE 100 MG tablet Commonly known as: APRESOLINE Take 1 tablet (100 mg total) by mouth 3 (three) times daily.    isosorbide mononitrate 60 MG 24 hr tablet Commonly known as: IMDUR Take 60 mg by mouth daily.   Lantus SoloStar 100 UNIT/ML Solostar Pen Generic drug: insulin glargine Inject 10 Units into the skin daily.   latanoprost 0.005 % ophthalmic solution Commonly known as: XALATAN Place 1 drop into the left eye at bedtime.   metroNIDAZOLE 500 MG tablet Commonly known as: FLAGYL Take 1 tablet (500 mg total) by mouth 3 (three) times daily for 2 days.   nitroGLYCERIN 0.4 MG SL tablet Commonly known as: NITROSTAT DISSOLVE ONE TABLET UNDER THE TONGUE EVERY 5 MINUTES AS NEEDED FOR CHEST PAIN. What changed: See the new instructions.   OneTouch Verio test strip Generic drug: glucose blood USE 1 STRIP TO CHECK GLUCOSE THREE TIMES DAILY AS DIRECTED   oxyCODONE HCl 7.5 MG Tabs Take 7.5 mg by mouth every 4 (four) hours as needed for moderate pain.   pantoprazole 40 MG tablet Commonly known as: Protonix Take 1 tablet (40 mg total) by mouth 2 (two) times daily before a meal.   Pentips 32G X 4 MM Misc Generic drug: Insulin Pen Needle Use as directed with insulin   polyethylene glycol 17 g packet Commonly known as: MiraLax Take  17 g by mouth daily.   tetracycline 500 MG capsule Commonly known as: SUMYCIN Take 1 capsule (500 mg total) by mouth 2 (two) times daily before a meal for 2 days.        No Known Allergies  Consultations: Radiation oncology, oncology, cardiology, palliative care/hospice  Procedures/Studies: DG Chest 1 View  Result Date: 03/20/2021 CLINICAL DATA:  Volume overload EXAM: CHEST  1 VIEW COMPARISON:  03/19/2021, CT 03/07/2021 FINDINGS: Cardiomegaly with vascular congestion. Mild perihilar ground-glass opacity overall diminished compared to the radiograph from 09/26. Trace pleural effusions. More focal consolidation in the right mid lung and medial right base are also slightly improved. Mild consolidation medial left base. Aortic atherosclerosis. No pneumothorax  IMPRESSION: 1. Cardiomegaly with overall decreased bilateral ground-glass opacity/possible edema compared to prior. 2. There is residual consolidation in the right mid and lower lung and the medial left base which may reflect pneumonia Electronically Signed   By: Donavan Foil M.D.   On: 03/20/2021 19:42   CT CHEST WO CONTRAST  Result Date: 04/07/2021 CLINICAL DATA:  GI cancer, for staging EXAM: CT CHEST WITHOUT CONTRAST TECHNIQUE: Multidetector CT imaging of the chest was performed following the standard protocol without IV contrast. COMPARISON:  03/07/2021 FINDINGS: Cardiovascular: Mild cardiomegaly. No pericardial effusion. Hypodense blood pool relative to myocardium, suggesting anemia. No evidence of thoracic aortic aneurysm. Atherosclerotic calcifications of the aortic arch. Mild coronary atherosclerosis of the LAD and right coronary artery. Mediastinum/Nodes: No suspicious mediastinal lymphadenopathy. Visualized thyroid is unremarkable. Lungs/Pleura: Patchy opacity in the posterior right upper lobe (series 8/image 89), suggesting pneumonia. Moderate right and small left pleural effusions. Mild interlobular septal thickening in the upper lobes suggests very mild superimposed edema is possible. Mild bilateral lower lobe compressive atelectasis. 4 mm ground-glass nodule in the anterior right upper lobe (series 8/image 83), possibly related to the suspected edema, but warranting attention on follow-up. No pneumothorax. Upper Abdomen: Visualized upper abdomen is grossly unremarkable, noting vascular calcifications. Musculoskeletal: Degenerative changes of the visualized thoracolumbar spine. IMPRESSION: Right upper lobe opacity, suspicious for pneumonia. Moderate right and small left pleural effusions. Suspected mild superimposed pulmonary edema. 4 mm ground-glass nodule in the anterior right upper lobe, possibly related to the suspected edema, but warranting attention on follow-up. Consider follow-up CT chest  in 3-6 months. No findings specific for metastatic disease in the chest. Aortic Atherosclerosis (ICD10-I70.0). Electronically Signed   By: Julian Hy M.D.   On: 04/07/2021 21:12   MR PELVIS W WO CONTRAST  Result Date: 04/08/2021 CLINICAL DATA:  Rectal mass on colonoscopy, suspected rectal cancer, metastatic disease evaluation EXAM: MRI ABDOMEN AND PELVIS WITHOUT AND WITH CONTRAST TECHNIQUE: Multiplanar multisequence MR imaging of the abdomen and pelvis was performed both before and after the administration of intravenous contrast. CONTRAST:  96m GADAVIST GADOBUTROL 1 MMOL/ML IV SOLN COMPARISON:  None. FINDINGS: Markedly limited evaluation due to motion degradation. Lower chest: Moderate right and small left pleural effusions. Hepatobiliary: No focal hepatic lesion is seen, noting motion degradation. Gallbladder is grossly unremarkable. No intrahepatic or extrahepatic ductal dilatation. Pancreas:  Grossly unremarkable. Spleen:  Grossly unremarkable. Adrenals/Urinary Tract:  Adrenal glands are grossly unremarkable. Subcentimeter bilateral renal cysts.  No hydronephrosis. Trabeculated bladder. Stomach/Bowel: Stomach is grossly unremarkable. No evidence of bowel obstruction. Enhancing masslike concentric thickening involving the lower rectum (series 29/image 30), corresponding to the recently biopsied rectal Mass. Vascular/Lymphatic:  No evidence of aneurysm. Marked limited evaluation for lymph nodes given motion degradation. Reproductive: Prostate is grossly unremarkable. Other:  No abdominopelvic ascites.  Musculoskeletal: No focal osseous lesions. IMPRESSION: Markedly limited evaluation due to motion degradation. Enhancing, masslike concentric wall thickening involving the lower rectum, corresponding to recently biopsied rectal mass. No definite findings of metastatic disease, noting significant limitations. Assuming this patient has rectal adenocarcinoma, for more accurate staging, consider follow-up CT  abdomen/pelvis with contrast in 6 weeks. Electronically Signed   By: Julian Hy M.D.   On: 04/08/2021 20:10   MR ABDOMEN W WO CONTRAST  Result Date: 04/08/2021 CLINICAL DATA:  Rectal mass on colonoscopy, suspected rectal cancer, metastatic disease evaluation EXAM: MRI ABDOMEN AND PELVIS WITHOUT AND WITH CONTRAST TECHNIQUE: Multiplanar multisequence MR imaging of the abdomen and pelvis was performed both before and after the administration of intravenous contrast. CONTRAST:  79m GADAVIST GADOBUTROL 1 MMOL/ML IV SOLN COMPARISON:  None. FINDINGS: Markedly limited evaluation due to motion degradation. Lower chest: Moderate right and small left pleural effusions. Hepatobiliary: No focal hepatic lesion is seen, noting motion degradation. Gallbladder is grossly unremarkable. No intrahepatic or extrahepatic ductal dilatation. Pancreas:  Grossly unremarkable. Spleen:  Grossly unremarkable. Adrenals/Urinary Tract:  Adrenal glands are grossly unremarkable. Subcentimeter bilateral renal cysts.  No hydronephrosis. Trabeculated bladder. Stomach/Bowel: Stomach is grossly unremarkable. No evidence of bowel obstruction. Enhancing masslike concentric thickening involving the lower rectum (series 29/image 30), corresponding to the recently biopsied rectal mass. Vascular/Lymphatic:  No evidence of aneurysm. Markedly limited evaluation for lymph nodes given motion degradation. Reproductive: Prostate is grossly unremarkable. Other:  No abdominopelvic ascites. Musculoskeletal: No focal osseous lesions. IMPRESSION: Markedly limited evaluation due to motion degradation. Enhancing, masslike concentric wall thickening involving the lower rectum, corresponding to recently biopsied rectal mass. No definite findings of metastatic disease, noting significant limitations. Assuming this patient has rectal adenocarcinoma, for more accurate staging, consider follow-up CT abdomen/pelvis with contrast in 6 weeks. Electronically Signed   By:  SJulian HyM.D.   On: 04/08/2021 20:03   DG Chest Port 1 View  Result Date: 04/01/2021 CLINICAL DATA:  70year old male with history of shortness of breath. EXAM: PORTABLE CHEST 1 VIEW COMPARISON:  Chest x-ray 03/20/2021. FINDINGS: Persistent consolidation in the inferior aspect of the right upper lobe. Patchy areas of interstitial prominence and several ill-defined opacities are also now noted elsewhere throughout the lungs bilaterally. Small right pleural effusion. No left pleural effusion. No pneumothorax. No cephalization of the pulmonary vasculature. Heart size is mildly enlarged. The patient is rotated to the right on today's exam, resulting in distortion of the mediastinal contours and reduced diagnostic sensitivity and specificity for mediastinal pathology. Atherosclerotic calcifications in the thoracic aorta. IMPRESSION: 1. Slight worsening consolidation in the right upper lobe, compatible with pneumonia. There is also evidence to suggest developing multilobar bilateral bronchopneumonia, as above. 2. Cardiomegaly. 3. Aortic atherosclerosis. Electronically Signed   By: DVinnie LangtonM.D.   On: 04/01/2021 06:25   DG CHEST PORT 1 VIEW  Result Date: 03/19/2021 CLINICAL DATA:  Dyspnea. EXAM: PORTABLE CHEST 1 VIEW COMPARISON:  03/17/2021 FINDINGS: AP view of the chest again demonstrates airspace disease and consolidation in the mid right lung. There appears to be a small amount of pleural fluid particularly on the right side. Hazy densities in the left lung may represent a small amount of left lung airspace disease. Heart size is enlarged but stable. Atherosclerotic calcifications at the aortic arch. Trachea is midline. Negative for a pneumothorax. IMPRESSION: 1. No significant change in the airspace disease and consolidation in the right lung. Suspect a small amount of pleural fluid. 2. Probable mild airspace disease in  left lung. Electronically Signed   By: Markus Daft M.D.   On: 03/19/2021  08:54   DG Chest Port 1 View  Result Date: 03/17/2021 CLINICAL DATA:  Hypoxic respiratory failure. EXAM: PORTABLE CHEST 1 VIEW COMPARISON:  03/16/2021 FINDINGS: Normal scratch set stable cardiomediastinal contours. No pleural effusion identified. There is diffuse pulmonary vascular congestion. Dense airspace consolidation within the right upper lobe is unchanged. Mild patchy airspace densities are noted within the right lower lobe, left midlung and left base, also unchanged. IMPRESSION: 1. No change in aeration to the lungs compared with prior exam. 2. Stable pulmonary vascular congestion. Electronically Signed   By: Kerby Moors M.D.   On: 03/17/2021 08:10   DG Chest Port 1 View  Result Date: 03/16/2021 CLINICAL DATA:  Dyspnea. EXAM: PORTABLE CHEST 1 VIEW COMPARISON:  March 05, 2021 FINDINGS: Marked severity infiltrate is seen within the mid right lung with moderate severity infiltrate noted along the right lung base. This represents a new finding when compared to the prior study. There is no evidence of a pleural effusion or pneumothorax. The heart size and mediastinal contours are within normal limits. Marked severity calcification of the thoracic aorta is seen. Multilevel degenerative changes are noted throughout the thoracic spine. IMPRESSION: Moderate to marked severity infiltrates within the mid right lung and right lung base. Electronically Signed   By: Virgina Norfolk M.D.   On: 03/16/2021 20:57   DG Swallowing Func-Speech Pathology  Result Date: 03/19/2021 Table formatting from the original result was not included. Objective Swallowing Evaluation: Type of Study: MBS-Modified Barium Swallow Study  Patient Details Name: DWYANE DUPREE MRN: [id number] Date of Birth: 1951/03/29 Today's Date: 03/19/2021 Time: SLP Start Time (ACUTE ONLY): 1350 -SLP Stop Time (ACUTE ONLY): 1407 SLP Time Calculation (min) (ACUTE ONLY): 17 min Past Medical History: Past Medical History: Diagnosis Date  Altered mental  status   a. 05/2017 - adm with blurred vision, somnolence in setting of AKI and high blood sugar.  Anemia   CKD (chronic kidney disease), stage III (HCC)   Diabetes mellitus   Diastolic CHF, chronic ([place])   A.  03/2009 Echo: EF 60-65%, Gr II diast dysfxn  Elevated troponin   a. 2013 - troponin 1.4. b. 2019 - troponin 0.32; neg nuc 07/2017.  Glaucoma   Hyperlipidemia   HYPERCHOLESTEROLEMIA  Hypertension   MARKED LEFT VENTRICULAR HYPERTROPHY BY PREVIOUS ECHOCARDIOGRAM--HE HAS HYPERDYNAMIC LEFT VENTRICULAR SYSTOLIC FUNCTION AND HAS IMPAIRED RELAXATION BY ECHO  Hypertrophic cardiomyopathy (HCC)   NSVT (nonsustained ventricular tachycardia) (HCC)   Premature atrial contractions   PVC's (premature ventricular contractions)   SVT (supraventricular tachycardia) ([place])  Past Surgical History: Past Surgical History: Procedure Laterality Date  BIOPSY  03/03/2021  Procedure: BIOPSY;  Surgeon: Jackquline Denmark, MD;  Location: [place];  Service: Endoscopy;;  BRONCHIAL BIOPSY  03/05/2021  Procedure: BRONCHIAL BIOPSIES;  Surgeon: Candee Furbish, MD;  Location: [facility] ENDOSCOPY;  Service: Pulmonary;;  BRONCHIAL BRUSHINGS  03/05/2021  Procedure: BRONCHIAL BRUSHINGS;  Surgeon: Candee Furbish, MD;  Location: [facility] ENDOSCOPY;  Service: Pulmonary;;  BRONCHIAL WASHINGS  03/05/2021  Procedure: BRONCHIAL WASHINGS;  Surgeon: Candee Furbish, MD;  Location: [facility] ENDOSCOPY;  Service: Pulmonary;;  ESOPHAGOGASTRODUODENOSCOPY (EGD) WITH PROPOFOL N/A 03/03/2021  Procedure: ESOPHAGOGASTRODUODENOSCOPY (EGD) WITH PROPOFOL;  Surgeon: Jackquline Denmark, MD;  Location: [facility] ENDOSCOPY;  Service: Endoscopy;  Laterality: N/A;  HEMOSTASIS CONTROL  03/05/2021  Procedure: HEMOSTASIS CONTROL;  Surgeon: Candee Furbish, MD;  Location: [facility] ENDOSCOPY;  Service: Pulmonary;;  NO PAST SURGERIES  VIDEO BRONCHOSCOPY N/A 03/05/2021  Procedure: VIDEO BRONCHOSCOPY WITH FLUORO;  Surgeon: Candee Furbish, MD;  Location: [facility] ENDOSCOPY;  Service: Pulmonary;  Laterality: N/A; HPI: This 70 y.o.  African American male nonsmoker presented to the [facility] Emergency Department via EMS with complaints of respiratory distress.  Per EMS, the patient was found to be hypoxic.  SpO2 73% despite non-rebreather mask. He was reportedly placed on CPAP and subsequently became unresponsive.  He was supported with bag-mask ventilation and transported to the ER.  In the ER, the patient has been placed on BiPAP.Of note, the patient was recently hospitalized for pneumonia and was discharged on 9/17 and was treated with cefepime/vancomycin which was switched to Unasyn.  He underwent bronch/BAL/TBBx/brushings 03/05/21. All unrevealing but no BAL cell count/diff obtained. MD concerned about potential aspiration as patient has had recurrent PNA's recently.  Subjective: pleasant, sitting EOB eating dinner Assessment / Plan / Recommendation CHL IP CLINICAL IMPRESSIONS 03/19/2021 Clinical Impression Pt presents with pharyngeal dysphagia characterized by reduced anterior laryngeal movement and a pharyngeal delay which resulted in penetration (PAS 3, 5) of thin and nectar thick liquids. Prompted coughing was ineffective in expelling penetrate. Various postural modifications were attempted and a chin tuck posture with right head turn proved most effective eliminating laryngeal invasion. Mild pyriform sinus residue was noted with liquids and this was cleared with pt's independent use of secondary swallows. Pt's pharyngeal timing and clearance were adequate for solids. A regular texture diet with thin liquids is recommended at this time with observance of swallowing precautions to reduce aspiration risk. SLP will follow for treatment. SLP Visit Diagnosis Dysphagia, pharyngeal phase (R13.13) Attention and concentration deficit following -- Frontal lobe and executive function deficit following -- Impact on safety and function Mild aspiration risk   CHL IP TREATMENT RECOMMENDATION 03/19/2021 Treatment Recommendations  Therapy as outlined in treatment plan below   Prognosis 03/19/2021 Prognosis for Safe Diet Advancement Good Barriers to Reach Goals -- Barriers/Prognosis Comment -- CHL IP DIET RECOMMENDATION 03/19/2021 SLP Diet Recommendations Regular solids;Nectar thick liquid Liquid Administration via Cup;No straw Medication Administration Whole meds with liquid Compensations Slow rate;Small sips/bites;Chin tuck with right head turn, secondary swallows Postural Changes Seated upright at 90 degrees   CHL IP OTHER RECOMMENDATIONS 03/19/2021 Recommended Consults -- Oral Care Recommendations Oral care BID Other Recommendations --   CHL IP FOLLOW UP RECOMMENDATIONS 03/17/2021 Follow up Recommendations None   CHL IP FREQUENCY AND DURATION 03/19/2021 Speech Therapy Frequency (ACUTE ONLY) min 2x/week Treatment Duration 2 weeks      CHL IP ORAL PHASE 03/19/2021 Oral Phase WFL Oral - Pudding Teaspoon -- Oral - Pudding Cup -- Oral - Honey Teaspoon -- Oral - Honey Cup -- Oral - Nectar Teaspoon -- Oral - Nectar Cup -- Oral - Nectar Straw -- Oral - Thin Teaspoon -- Oral - Thin Cup -- Oral - Thin Straw -- Oral - Puree -- Oral - Mech Soft -- Oral - Regular -- Oral - Multi-Consistency -- Oral - Pill -- Oral Phase - Comment --  CHL IP PHARYNGEAL PHASE 03/19/2021 Pharyngeal Phase Impaired Pharyngeal- Pudding Teaspoon -- Pharyngeal -- Pharyngeal- Pudding Cup -- Pharyngeal -- Pharyngeal- Honey Teaspoon -- Pharyngeal -- Pharyngeal- Honey Cup -- Pharyngeal -- Pharyngeal- Nectar Teaspoon -- Pharyngeal -- Pharyngeal- Nectar Cup Delayed swallow initiation-vallecula;Delayed swallow initiation-pyriform sinuses;Penetration/Aspiration during swallow;Reduced anterior laryngeal mobility;Pharyngeal residue - pyriform Pharyngeal Material enters airway, CONTACTS cords and not ejected out Pharyngeal- Nectar Straw -- Pharyngeal -- Pharyngeal- Thin Teaspoon -- Pharyngeal -- Pharyngeal-  Thin Cup Delayed swallow initiation-vallecula;Delayed swallow initiation-pyriform  sinuses;Penetration/Aspiration during swallow;Reduced anterior laryngeal mobility;Pharyngeal residue - pyriform Pharyngeal Material enters airway, remains ABOVE vocal cords and not ejected out;Material enters airway, CONTACTS cords and not ejected out Pharyngeal- Thin Straw -- Pharyngeal -- Pharyngeal- Puree Reduced anterior laryngeal mobility Pharyngeal -- Pharyngeal- Mechanical Soft -- Pharyngeal -- Pharyngeal- Regular Reduced anterior laryngeal mobility Pharyngeal -- Pharyngeal- Multi-consistency -- Pharyngeal -- Pharyngeal- Pill Reduced anterior laryngeal mobility Pharyngeal -- Pharyngeal Comment --  CHL IP CERVICAL ESOPHAGEAL PHASE 03/19/2021 Cervical Esophageal Phase WFL Pudding Teaspoon -- Pudding Cup -- Honey Teaspoon -- Honey Cup -- Nectar Teaspoon -- Nectar Cup -- Nectar Straw -- Thin Teaspoon -- Thin Cup -- Thin Straw -- Puree -- Mechanical Soft -- Regular -- Multi-consistency -- Pill -- Cervical Esophageal Comment -- Shanika I. Hardin Negus, [place], [place] Office number [phone] Pager [phone] Horton Marshall 03/19/2021, 3:49 PM              ECHOCARDIOGRAM LIMITED BUBBLE STUDY  Result Date: 03/19/2021    ECHOCARDIOGRAM LIMITED REPORT   Patient Name:   WASHINGTON WHEDBEE Date of Exam: 03/19/2021 Medical Rec #:  [id number]     Height:       68.0 in Accession #:    [id number]    Weight:       165.1 lb Date of Birth:  [birth year]      BSA:          1.884 m Patient Age:    9 years      BP:           198/81 mmHg Patient Gender: M             HR:           69 bpm. Exam Location:  Inpatient Procedure: Limited Echo Indications:    Hypoxia [300808]  History:        Patient has prior history of Echocardiogram examinations, most                 recent 01/30/2021. CHF and Hypertrophic Cardiomyopathy; Risk                 Factors:Diabetes, Dyslipidemia and Hypertension.  Sonographer:    Bernadene Person RDCS Referring Phys: [id number] [place]  1. Limited echo for shunting in  the setting of hypoxia  2. Agitated saline contrast bubble study was positive with shunting observed after >6 cardiac cycles suggestive of intrapulmonary shunting. A moderate number of microbubbles were noted, consistent with a more significant shunt.  3. Left ventricular ejection fraction, by estimation, is 65 to 70%. The left ventricle has normal function. Comparison(s): Changes from prior study are noted. 01/30/2021: LVEF 70-75%. FINDINGS  Left Ventricle: Left ventricular ejection fraction, by estimation, is 65 to 70%. The left ventricle has normal function. IAS/Shunts: No atrial level shunt detected by color flow Doppler. Agitated saline contrast was given intravenously to evaluate for intracardiac shunting. Agitated saline contrast bubble study was positive with shunting observed after >6 cardiac cycles suggestive of intrapulmonary shunting. Lyman Bishop MD Electronically signed by Lyman Bishop MD Signature Date/Time: 03/19/2021/3:31:33 PM    Final    VAS [place] LOWER EXTREMITY VENOUS (DVT)  Result Date: 03/17/2021  Lower Venous DVT Study Patient Name:  ASHAUN GAUGHAN  Date of Exam:   03/17/2021 Medical Rec #: [id number]      Accession #:    [id number] Date of Birth: 1950/12/11  Patient Gender: M Patient Age:   47 years Exam Location[facility]  [facility] Procedure:      VAS [place] LOWER EXTREMITY VENOUS (DVT) Referring Phys: SEONG-JOO JEONG --------------------------------------------------------------------------------  Indications: Edema.  Limitations: Acoustic shadowing due to overlying calcification. Comparison Study: 01-14-2020 Bilateral lower extremity venous study was negative                   for DVT.                    01-08-2021 ABI w/ TBI showed bilateral absent DPA, moderate                   right arterial disease, mild left arterial disease. Performing Technologist: Darlin Coco RDMS, RVT  Examination Guidelines: A complete evaluation includes B-mode imaging, spectral Doppler, color Doppler, and  power Doppler as needed of all accessible portions of each vessel. Bilateral testing is considered an integral part of a complete examination. Limited examinations for reoccurring indications may be performed as noted. The reflux portion of the exam is performed with the patient in reverse Trendelenburg.  +---------+---------------+---------+-----------+----------+--------------+ RIGHT    CompressibilityPhasicitySpontaneityPropertiesThrombus Aging +---------+---------------+---------+-----------+----------+--------------+ CFV      Full           Yes      Yes                                 +---------+---------------+---------+-----------+----------+--------------+ SFJ      Full                                                        +---------+---------------+---------+-----------+----------+--------------+ FV Prox  Full                                                        +---------+---------------+---------+-----------+----------+--------------+ FV Mid   Full                                                        +---------+---------------+---------+-----------+----------+--------------+ FV DistalFull                                                        +---------+---------------+---------+-----------+----------+--------------+ PFV      Full                                                        +---------+---------------+---------+-----------+----------+--------------+ POP      Full           Yes      Yes                                 +---------+---------------+---------+-----------+----------+--------------+  PTV      Full                                                        +---------+---------------+---------+-----------+----------+--------------+ PERO     Full                                                        +---------+---------------+---------+-----------+----------+--------------+ Gastroc  Full                                                         +---------+---------------+---------+-----------+----------+--------------+   +---------+---------------+---------+-----------+----------+--------------+ LEFT     CompressibilityPhasicitySpontaneityPropertiesThrombus Aging +---------+---------------+---------+-----------+----------+--------------+ CFV      Full           Yes      Yes                                 +---------+---------------+---------+-----------+----------+--------------+ SFJ      Full                                                        +---------+---------------+---------+-----------+----------+--------------+ FV Prox  Full                                                        +---------+---------------+---------+-----------+----------+--------------+ FV Mid   Full                                                        +---------+---------------+---------+-----------+----------+--------------+ FV DistalFull                                                        +---------+---------------+---------+-----------+----------+--------------+ PFV      Full                                                        +---------+---------------+---------+-----------+----------+--------------+ POP      Full           Yes      Yes                                 +---------+---------------+---------+-----------+----------+--------------+  PTV      Full                                                        +---------+---------------+---------+-----------+----------+--------------+ PERO     Full                                                        +---------+---------------+---------+-----------+----------+--------------+ Gastroc  Full                                                        +---------+---------------+---------+-----------+----------+--------------+     Summary: RIGHT: - There is no evidence of deep vein thrombosis in the lower extremity.  -  No cystic structure found in the popliteal fossa.  LEFT: - There is no evidence of deep vein thrombosis in the lower extremity.  - No cystic structure found in the popliteal fossa.  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 *See table(s) above for measurements and observations. Electronically signed by Servando Snare MD on 03/17/2021 at 5:23:46 PM.    Final      Subjective: No acute issues or events overnight, patient's only complaint is of some leg stiffness due to prolonged bedbound status while in the inpatient setting.  Otherwise denies chest pain nausea vomiting diarrhea constipation headache fevers chills.   Discharge Exam: Vitals:   04/14/21 0600 04/14/21 1017  BP: (!) 145/74 (!) 183/80  Pulse: 61 69  Resp: 18 18  Temp: 99.3 F (37.4 C) 97.6 F (36.4 C)  SpO2: 97% 92%   Vitals:   04/13/21 1220 04/13/21 1901 04/14/21 0600 04/14/21 1017  BP: (!) 165/74 (!) 141/63 (!) 145/74 (!) 183/80  Pulse: 62 (!) 59 61 69  Resp: _0 Temp: 98.2 F (36.8 C)  99.3 F (37.4 C) 97.6 F (36.4 C)  TempSrc: Oral  Oral Axillary  SpO2: 100% 92% 97% 92%  Weight:   77 kg   Height:        General: Pt is alert, awake, not in acute distress Cardiovascular: RRR, S1/S2 +, no rubs, no gallops Respiratory: CTA bilaterally, no wheezing, no rhonchi Abdominal: Soft, NT, ND, bowel sounds + Extremities: no edema, no cyanosis    The results of significant diagnostics from this hospitalization (including imaging, microbiology, ancillary and laboratory) are listed below for reference.     Microbiology: No results found for this or any previous visit (from the past 240 hour(s)).   Labs: BNP (last 3 results) Recent Labs    03/16/21 1953 03/22/21 0346 04/01/21 0615  BNP 1,475.0* 909.5* 8,546.2*   Basic Metabolic Panel: Recent Labs  Lab 04/09/21 0417 04/10/21 0354 04/11/21 0440 04/13/21 0440  NA 137 139 136 132*  K 4.6 4.7 5.0 5.1  CL 109 110 108 106  CO2 23 21* 21*  19*  GLUCOSE 95 74 87 79  BUN 58* 63* 65* 64*  CREATININE 3.40* 3.33* 3.25* 3.19*  CALCIUM 8.8* 8.9 8.7* 8.6*  MG 2.3  --   --   --  Liver Function Tests: No results for input(s): AST, ALT, ALKPHOS, BILITOT, PROT, ALBUMIN in the last 168 hours. No results for input(s): LIPASE, AMYLASE in the last 168 hours. No results for input(s): AMMONIA in the last 168 hours. CBC: Recent Labs  Lab 04/08/21 0338 04/09/21 0417 04/10/21 0354 04/11/21 0440 04/13/21 0440  WBC 5.6 4.4 4.8 5.1 5.9  HGB 9.3* 9.0* 9.7* 9.1* 9.1*  HCT 30.5* 29.1* 31.6* 30.5* 30.7*  MCV 93.6 94.8 94.3 96.8 97.8  PLT 201 174 169 169 189   Cardiac Enzymes: No results for input(s): CKTOTAL, CKMB, CKMBINDEX, TROPONINI in the last 168 hours. BNP: Invalid input(s): POCBNP CBG: Recent Labs  Lab 04/13/21 0729 04/13/21 1102 04/13/21 1619 04/14/21 0750 04/14/21 1211  GLUCAP 85 200* 120* 73 207*   D-Dimer No results for input(s): DDIMER in the last 72 hours. Hgb A1c No results for input(s): HGBA1C in the last 72 hours. Lipid Profile No results for input(s): CHOL, HDL, LDLCALC, TRIG, CHOLHDL, LDLDIRECT in the last 72 hours. Thyroid function studies No results for input(s): TSH, T4TOTAL, T3FREE, THYROIDAB in the last 72 hours.  Invalid input(s): FREET3 Anemia work up No results for input(s): VITAMINB12, FOLATE, FERRITIN, TIBC, IRON, RETICCTPCT in the last 72 hours. Urinalysis    Component Value Date/Time   COLORURINE YELLOW 03/01/2021 0029   APPEARANCEUR CLEAR 03/01/2021 0029   LABSPEC 1.011 03/01/2021 0029   PHURINE 5.0 03/01/2021 0029   GLUCOSEU NEGATIVE 03/01/2021 0029   HGBUR NEGATIVE 03/01/2021 0029   BILIRUBINUR NEGATIVE 03/01/2021 0029   KETONESUR NEGATIVE 03/01/2021 0029   PROTEINUR 30 (A) 03/01/2021 0029   UROBILINOGEN 1.0 07/19/2011 1335   NITRITE NEGATIVE 03/01/2021 0029   LEUKOCYTESUR MODERATE (A) 03/01/2021 0029   Sepsis Labs Invalid input(s): PROCALCITONIN,  WBC,   LACTICIDVEN Microbiology No results found for this or any previous visit (from the past 240 hour(s)).   Time coordinating discharge: Over 30 minutes  SIGNED:   Little Ishikawa, DO [facility] 04/14/2021, 12:23 PM Pager   If 7PM-7AM, please contact night-coverage [email or web address]

## 2021-04-14 NOTE — Progress Notes (Signed)
SATURATION QUALIFICATIONS: (This note is used to comply with regulatory documentation for home oxygen)  Patient Saturations on Room Air at Rest = 90 %  Patient Saturations on Room Air while Ambulating = 86%  Patient Saturations on 4 Liters of oxygen while Ambulating = 96 %  Please briefly explain why patient needs home oxygen:

## 2021-04-15 DIAGNOSIS — I5032 Chronic diastolic (congestive) heart failure: Secondary | ICD-10-CM | POA: Diagnosis not present

## 2021-04-15 DIAGNOSIS — G4733 Obstructive sleep apnea (adult) (pediatric): Secondary | ICD-10-CM | POA: Diagnosis not present

## 2021-04-16 ENCOUNTER — Other Ambulatory Visit: Payer: Self-pay

## 2021-04-16 ENCOUNTER — Ambulatory Visit: Payer: PPO

## 2021-04-16 DIAGNOSIS — C20 Malignant neoplasm of rectum: Secondary | ICD-10-CM

## 2021-04-17 ENCOUNTER — Ambulatory Visit: Payer: PPO

## 2021-04-17 ENCOUNTER — Telehealth: Payer: Self-pay | Admitting: Internal Medicine

## 2021-04-17 ENCOUNTER — Encounter: Payer: Self-pay | Admitting: General Practice

## 2021-04-17 NOTE — Progress Notes (Signed)
[facility] Clinical Social Work  Initial Assessment   Robert Chaney is a 70 y.o. year old male contacted by phone. Clinical Social Work was referred by radiation oncology for assessment of psychosocial needs.   SDOH (Social Determinants of Health) assessments performed: Yes SDOH Interventions    Flowsheet Row Most Recent Value  SDOH Interventions   Financial Strain Interventions Financial Counselor  Housing Interventions Intervention Not Indicated  Transportation Interventions [facility]       Distress Screen completed: No No flowsheet data found.    Family/Social Information:  Housing Arrangement: patient lives alone Family members/support persons in your life? Family and family lives out of state and cannot come regularly Transportation concerns: yes, has no one to drive him, uses walker and needs to travel w O2.  Referral sent to [facility] by both undersigned CSW and [facility].   Has not been called by [employer] his car 2 - 3 months ago, has decided not to drive as a result.   Employment: Retired. Income source: Paediatric nurse concerns: Yes, current concerns Type of concern: None Food access concerns: yes, "hard to find the kind of food I like to eat."  Prefers food from African.   Religious or spiritual practice: yes, attends church sometimes. Medication Concerns: yes, "insulin is expensive."    Services Currently in place:  no services set up at recent discharge.  He believes he needs in home services - "just a little errands, things like that."  Does use oxygen at home, may sometimes not be aware of whether his travel tanks are full.    Coping/ Adjustment to diagnosis: Patient understands treatment plan and what happens next? yes, newly diagnosed with rectal cancer, is in radiation therapy.  Has missed appointments because of lack of transport.   Concerns about diagnosis and/or treatment: I'm not especially worried  about anything " I was expecting anything - I kept telling them I was constipated for past year."  He had an idea that he was ill.   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: getting into treatment, he has been concerned about his health for quite some time, is grateful to be starting treatment  Patient enjoys  reading religious books Current coping skills/ strengths: Capable of independent living , Communication skills , and Religious Affiliation     SUMMARY: Current SDOH Barriers:  Transportation  Interventions: Discussed common feeling and emotions when being diagnosed with cancer, and the importance of support during treatment Informed patient of the support team roles and support services at [facility] Provided CSW contact information and encouraged patient to call with any questions or concerns Referred patient to [employer] , radiation Financial Advocates   Follow Up Plan: Patient will contact CSW with any support or resource needs Patient verbalizes understanding of plan: Yes    Beverely Pace , [place], [place] Worker Phone:  [phone]

## 2021-04-18 ENCOUNTER — Ambulatory Visit
Admission: RE | Admit: 2021-04-18 | Discharge: 2021-04-18 | Disposition: A | Discharge status: Home / Self Care | Payer: PPO | Source: Ambulatory Visit | Attending: Radiation Oncology | Admitting: Radiation Oncology

## 2021-04-18 ENCOUNTER — Other Ambulatory Visit: Payer: Self-pay

## 2021-04-18 DIAGNOSIS — C20 Malignant neoplasm of rectum: Secondary | ICD-10-CM | POA: Diagnosis not present

## 2021-04-18 DIAGNOSIS — Z510 Encounter for antineoplastic radiation therapy: Secondary | ICD-10-CM | POA: Diagnosis not present

## 2021-04-18 LAB — ACID FAST CULTURE WITH REFLEXED SENSITIVITIES (MYCOBACTERIA)
Acid Fast Culture: NEGATIVE
Acid Fast Culture: NEGATIVE
Acid Fast Culture: NEGATIVE

## 2021-04-18 NOTE — Telephone Encounter (Signed)
   Robert Chaney DOB: 03-Jul-1950 MRN: [id number]   RIDER WAIVER AND RELEASE OF LIABILITY  For purposes of improving physical access to our facilities, [facility] is pleased to partner with third parties to provide [place] patients or other authorized individuals the option of convenient, on-demand ground transportation services (the [place]") through use of the technology service that enables users to request on-demand ground transportation from independent third-party providers.  By opting to use and accept these [employer], I, the undersigned, hereby agree on behalf of myself, and on behalf of any minor child using the Government social research officer for whom I am the parent or legal guardian, as follows:  Government social research officer provided to me are provided by independent third-party transportation providers who are not [employer] or employees and who are unaffiliated with [employer]. [facility] is neither a transportation carrier nor a common or public carrier. [facility] has no control over the quality or safety of the transportation that occurs as a result of the [employer]. [facility] cannot guarantee that any third-party transportation provider will complete any arranged transportation service. [facility] makes no representation, warranty, or guarantee regarding the reliability, timeliness, quality, safety, suitability, or availability of any of the Transport Services or that they will be error free. I fully understand that traveling by vehicle involves risks and dangers of serious bodily injury, including permanent disability, paralysis, and death. I agree, on behalf of myself and on behalf of any minor child using the Transport Services for whom I am the parent or legal guardian, that the entire risk arising out of my use of the [employer] remains solely with me, to the maximum extent permitted under applicable law. The [employer] are provided "as is"  and "as available." [facility] disclaims all representations and warranties, express, implied or statutory, not expressly set out in these terms, including the implied warranties of merchantability and fitness for a particular purpose. I hereby waive and release [facility], its agents, employees, officers, directors, representatives, insurers, attorneys, assigns, successors, subsidiaries, and affiliates from any and all past, present, or future claims, demands, liabilities, actions, causes of action, or suits of any kind directly or indirectly arising from acceptance and use of the [employer]. I further waive and release [facility] [facility] and its affiliates from all present and future liability and responsibility for any injury or death to persons or damages to property caused by or related to the use of the [employer]. I have read this Waiver and Release of Liability, and I understand the terms used in it and their legal significance. This Waiver is freely and voluntarily given with the understanding that my right (as well as the right of any minor child for whom I am the parent or legal guardian using the [employer]) to legal recourse against [place] in connection with the [employer] is knowingly surrendered in return for use of these services.   I attest that I read the consent document to Robert Chaney, gave Mr. Umholtz the opportunity to ask questions and answered the questions asked (if any). I affirm that Robert Chaney then provided consent for he's participation in this program.     Darrick Meigs [place]

## 2021-04-19 ENCOUNTER — Ambulatory Visit
Admission: RE | Admit: 2021-04-19 | Discharge: 2021-04-19 | Disposition: A | Discharge status: Home / Self Care | Payer: PPO | Source: Ambulatory Visit | Attending: Radiation Oncology | Admitting: Radiation Oncology

## 2021-04-19 ENCOUNTER — Inpatient Hospital Stay: Payer: PPO | Attending: Nurse Practitioner | Admitting: Nurse Practitioner

## 2021-04-19 VITALS — BP 181/80 | HR 75 | Resp 17

## 2021-04-19 DIAGNOSIS — Z510 Encounter for antineoplastic radiation therapy: Secondary | ICD-10-CM | POA: Insufficient documentation

## 2021-04-19 DIAGNOSIS — C20 Malignant neoplasm of rectum: Secondary | ICD-10-CM | POA: Insufficient documentation

## 2021-04-19 DIAGNOSIS — Z515 Encounter for palliative care: Secondary | ICD-10-CM | POA: Diagnosis not present

## 2021-04-19 DIAGNOSIS — K5900 Constipation, unspecified: Secondary | ICD-10-CM

## 2021-04-19 NOTE — Progress Notes (Signed)
  Outpatient Palliative Care  [phone] ________________________________ Nursing Assessment:  Name: Robert Chaney        MRN: [id number]  Date of Service: 04/19/2021 DOB: October 17, 1950 Visit Type: New Patient  Support at Visit: Unaccompanied   Review of Systems: General: Robert Chaney reports feeling weak and fatigued, which is compounded by activity. Reports napping during the day. Weight loss. Reports sleeping "some", uses CPAP at night.  Neuro: No numbness/tingling, blurred vision. Experiences dizziness at times during the day.  Cardiac: None Pulmonary: SHOB with activity. Robert Chaney is on chronic oxygen.   GI: Robert Chaney reports a normal appetite. Constipation, 0-1 bowel movements per week. Stated he needs to pick up Miralax from the store.  GU: None Integumentary: None Psychological: No anxiety, depression, or feelings of hopelessness.       Pain Review: Scale of 0-10: 0 Location: Description: Frequency: Relief:   Social Review: Living Situation: lives at home by hisself Support:    Falls in the last 3 months? Yes  Assistive Device Use? Walker/wheelchair  Functional Status: Independent, not able to drive self  ACP Form Review: Robert Chaney stated he does not have an advance directive or MOST form. I gave him information on advance directive clinic.   Family/Patient Concerns:  Constipation   Provider notified of patient assessment and concerns. Patient instructed to call with any questions or concerns.

## 2021-04-19 NOTE — Progress Notes (Signed)
Robert Chaney  [phone] [phone]   Name: Robert Chaney Date: 04/19/2021 MRN: [id number]  DOB: 12-Aug-1950  Patient Care Team: [name], MD as PCP - General (Internal Medicine) Skeet Latch, MD as PCP - Cardiology (Cardiology) Skeet Latch, MD as Consulting Physician (Cardiology)    REASON FOR CONSULTATION: Robert Chaney is a 70 y.o. male with multiple medical problems including newly diagnosed rectal adenocarcinoma (currently receiving radiation), diabetes, diastolic CHF, chronic hypoxic respiratory failure (4 L home oxygen), hypertension, hyperlipidemia, CKD stage IV, and glaucoma. .  Palliative ask to see for symptom management and goals of care.    SOCIAL HISTORY:     reports that he has never smoked. He has never used smokeless tobacco. He reports that he does not drink alcohol and does not use drugs.  ADVANCE DIRECTIVES:  Patient does not have a completed advanced directive. Declines to complete however confirms his brother, Stancil Deisher is his Scientist, research (medical).   CODE STATUS:   PAST MEDICAL HISTORY: Past Medical History:  Diagnosis Date   Altered mental status    a. 05/2017 - adm with blurred vision, somnolence in setting of AKI and high blood sugar.   Anemia    CKD (chronic kidney disease), stage III (HCC)    Diabetes mellitus    Diastolic CHF, chronic ([place])    A.  03/2009 Echo: EF 60-65%, Gr II diast dysfxn   Elevated troponin    a. 2013 - troponin 1.4. b. 2019 - troponin 0.32; neg nuc 07/2017.   Glaucoma    Hyperlipidemia    HYPERCHOLESTEROLEMIA   Hypertension    MARKED LEFT VENTRICULAR HYPERTROPHY BY PREVIOUS ECHOCARDIOGRAM--HE HAS HYPERDYNAMIC LEFT VENTRICULAR SYSTOLIC FUNCTION AND HAS IMPAIRED RELAXATION BY ECHO   Hypertrophic cardiomyopathy (HCC)    NSVT (nonsustained ventricular tachycardia)    Premature atrial contractions    PVC's (premature ventricular contractions)     SVT (supraventricular tachycardia) ([place])     PAST SURGICAL HISTORY:  Past Surgical History:  Procedure Laterality Date   BIOPSY  03/03/2021   Procedure: BIOPSY;  Surgeon: Jackquline Denmark, MD;  Location: [place];  Service: Endoscopy;;   BIOPSY  04/06/2021   Procedure: BIOPSY;  Surgeon: [name], MD;  Location: [place];  Service: Gastroenterology;;   BRONCHIAL BIOPSY  03/05/2021   Procedure: BRONCHIAL BIOPSIES;  Surgeon: Candee Furbish, MD;  Location: [facility] ENDOSCOPY;  Service: Pulmonary;;   BRONCHIAL BRUSHINGS  03/05/2021   Procedure: BRONCHIAL BRUSHINGS;  Surgeon: Candee Furbish, MD;  Location: [place];  Service: Pulmonary;;   BRONCHIAL WASHINGS  03/05/2021   Procedure: BRONCHIAL WASHINGS;  Surgeon: Candee Furbish, MD;  Location: [facility] ENDOSCOPY;  Service: Pulmonary;;   COLONOSCOPY WITH PROPOFOL N/A 04/06/2021   Procedure: COLONOSCOPY WITH PROPOFOL;  Surgeon: [name], MD;  Location: [place];  Service: Gastroenterology;  Laterality: N/A;   ESOPHAGOGASTRODUODENOSCOPY (EGD) WITH PROPOFOL N/A 03/03/2021   Procedure: ESOPHAGOGASTRODUODENOSCOPY (EGD) WITH PROPOFOL;  Surgeon: Jackquline Denmark, MD;  Location: [facility] ENDOSCOPY;  Service: Endoscopy;  Laterality: N/A;   HEMOSTASIS CONTROL  03/05/2021   Procedure: HEMOSTASIS CONTROL;  Surgeon: Candee Furbish, MD;  Location: [facility] ENDOSCOPY;  Service: Pulmonary;;   NO PAST SURGERIES     POLYPECTOMY  04/06/2021   Procedure: POLYPECTOMY;  Surgeon: [name], MD;  Location: [place];  Service: Gastroenterology;;   VIDEO BRONCHOSCOPY N/A 03/05/2021   Procedure: VIDEO BRONCHOSCOPY WITH FLUORO;  Surgeon: Candee Furbish, MD;  Location: [facility] ENDOSCOPY;  Service: Pulmonary;  Laterality: N/A;    HEMATOLOGY/ONCOLOGY HISTORY:  Oncology History  Rectal adenocarcinoma ([place])  04/09/2021 Initial Diagnosis   Rectal adenocarcinoma ([place])   04/10/2021 Cancer Staging   Staging form: Colon and Rectum, AJCC 8th Edition - Clinical stage from  04/10/2021: Stage IIA (cT3, cN0, cM0) - Signed by Eppie Gibson, MD on 04/11/2021 Total positive nodes: 0      ALLERGIES:  has No Known Allergies.  MEDICATIONS:  Current Outpatient Medications  Medication Sig Dispense Refill   amLODipine (NORVASC) 10 MG tablet Take 1 tablet (10 mg total) by mouth daily. 30 tablet 1   atorvastatin (LIPITOR) 20 MG tablet Take 1 tablet (20 mg total) by mouth daily. 90 tablet 2   brimonidine (ALPHAGAN) 0.2 % ophthalmic solution Place 1 drop into the right eye 2 (two) times daily.     carvedilol (COREG) 25 MG tablet Take 1 tablet (25 mg total) by mouth 2 (two) times daily. 60 tablet 0   cholecalciferol (VITAMIN D3) 25 MCG (1000 UT) tablet Take 1,000 Units by mouth daily.     Continuous Blood Gluc Sensor (DEXCOM G6 SENSOR) MISC 1 Device by Does not apply route as directed. 9 each 3   Continuous Blood Gluc Transmit (DEXCOM G6 TRANSMITTER) MISC 1 Device by Does not apply route as directed. 1 each 3   docusate sodium (COLACE) 100 MG capsule Take 2 capsules (200 mg total) by mouth 2 (two) times daily. 60 capsule 0   dorzolamide (TRUSOPT) 2 % ophthalmic solution Place 1 drop into both eyes 2 (two) times daily.     furosemide (LASIX) 40 MG tablet Take 1 tablet (40 mg total) by mouth 2 (two) times daily. 60 tablet 1   glucose blood (ONETOUCH VERIO) test strip USE 1 STRIP TO CHECK GLUCOSE THREE TIMES DAILY AS DIRECTED 100 each 0   hydrALAZINE (APRESOLINE) 100 MG tablet Take 1 tablet (100 mg total) by mouth 3 (three) times daily. 270 tablet 3   insulin glargine (LANTUS SOLOSTAR) 100 UNIT/ML Solostar Pen Inject 10 Units into the skin daily. 15 mL 11   Insulin Pen Needle 32G X 4 MM MISC Use as directed with insulin 100 each 0   isosorbide mononitrate (IMDUR) 60 MG 24 hr tablet Take 60 mg by mouth daily.     latanoprost (XALATAN) 0.005 % ophthalmic solution Place 1 drop into the left eye at bedtime.     nitroGLYCERIN (NITROSTAT) 0.4 MG SL tablet DISSOLVE ONE TABLET UNDER  THE TONGUE EVERY 5 MINUTES AS NEEDED FOR CHEST PAIN. (Patient taking differently: Place 0.4 mg under the tongue every 5 (five) minutes as needed for chest pain.) 25 tablet 7   oxyCODONE 7.5 MG TABS Take 7.5 mg by mouth every 4 (four) hours as needed for moderate pain. 30 tablet 0   pantoprazole (PROTONIX) 40 MG tablet Take 1 tablet (40 mg total) by mouth 2 (two) times daily before a meal. 60 tablet 2   polyethylene glycol (MIRALAX) 17 g packet Take 17 g by mouth daily. (Patient not taking: Reported on 04/19/2021) 28 each 0   No current facility-administered medications for this visit.    VITAL SIGNS: BP (!) 181/80 (BP Location: Left Arm, Patient Position: Sitting)   Pulse 75   Resp 17  There were no vitals filed for this visit.  Estimated body mass index is 25.81 kg/m as calculated from the following:   Height as of 04/11/21: 5\' 8"  (1.727 m).   Weight as  of 04/14/21: 169 lb 12.1 oz (77 kg).  LABS: CBC:    Component Value Date/Time   WBC 5.9 04/13/2021 0440   HGB 9.1 (L) 04/13/2021 0440   HGB 12.2 (L) 11/07/2016 0922   HCT 30.7 (L) 04/13/2021 0440   HCT 35.9 (L) 11/07/2016 0922   PLT 189 04/13/2021 0440   PLT 233 11/07/2016 0922   MCV 97.8 04/13/2021 0440   MCV 94 11/07/2016 0922   NEUTROABS 4.8 04/02/2021 1428   NEUTROABS 3.3 11/07/2016 0922   LYMPHSABS 0.8 04/02/2021 1428   LYMPHSABS 1.7 11/07/2016 0922   MONOABS 0.5 04/02/2021 1428   EOSABS 0.1 04/02/2021 1428   EOSABS 0.2 11/07/2016 0922   BASOSABS 0.1 04/02/2021 1428   BASOSABS 0.0 11/07/2016 0922   Comprehensive Metabolic Panel:    Component Value Date/Time   NA 132 (L) 04/13/2021 0440   NA 138 05/09/2020 1120   K 5.1 04/13/2021 0440   CL 106 04/13/2021 0440   CO2 19 (L) 04/13/2021 0440   BUN 64 (H) 04/13/2021 0440   BUN 41 (H) 05/09/2020 1120   CREATININE 3.19 (H) 04/13/2021 0440   GLUCOSE 79 04/13/2021 0440   CALCIUM 8.6 (L) 04/13/2021 0440   AST 9 (L) 03/19/2021 1101   ALT 12 03/19/2021 1101   ALKPHOS  62 03/19/2021 1101   BILITOT 0.8 03/19/2021 1101   BILITOT 0.6 11/07/2016 0922   PROT 6.9 03/19/2021 1101   PROT 7.0 11/07/2016 0922   ALBUMIN 2.2 (L) 03/22/2021 0346   ALBUMIN 3.7 11/07/2016 0922    RADIOGRAPHIC STUDIES: DG Chest 1 View  Result Date: 03/20/2021 CLINICAL DATA:  Volume overload EXAM: CHEST  1 VIEW COMPARISON:  03/19/2021, CT 03/07/2021 FINDINGS: Cardiomegaly with vascular congestion. Mild perihilar ground-glass opacity overall diminished compared to the radiograph from 09/26. Trace pleural effusions. More focal consolidation in the right mid lung and medial right base are also slightly improved. Mild consolidation medial left base. Aortic atherosclerosis. No pneumothorax IMPRESSION: 1. Cardiomegaly with overall decreased bilateral ground-glass opacity/possible edema compared to prior. 2. There is residual consolidation in the right mid and lower lung and the medial left base which may reflect pneumonia Electronically Signed   By: Donavan Foil M.D.   On: 03/20/2021 19:42   CT CHEST WO CONTRAST  Result Date: 04/07/2021 CLINICAL DATA:  GI cancer, for staging EXAM: CT CHEST WITHOUT CONTRAST TECHNIQUE: Multidetector CT imaging of the chest was performed following the standard protocol without IV contrast. COMPARISON:  03/07/2021 FINDINGS: Cardiovascular: Mild cardiomegaly. No pericardial effusion. Hypodense blood pool relative to myocardium, suggesting anemia. No evidence of thoracic aortic aneurysm. Atherosclerotic calcifications of the aortic arch. Mild coronary atherosclerosis of the LAD and right coronary artery. Mediastinum/Nodes: No suspicious mediastinal lymphadenopathy. Visualized thyroid is unremarkable. Lungs/Pleura: Patchy opacity in the posterior right upper lobe (series 8/image 89), suggesting pneumonia. Moderate right and small left pleural effusions. Mild interlobular septal thickening in the upper lobes suggests very mild superimposed edema is possible. Mild bilateral  lower lobe compressive atelectasis. 4 mm ground-glass nodule in the anterior right upper lobe (series 8/image 83), possibly related to the suspected edema, but warranting attention on follow-up. No pneumothorax. Upper Abdomen: Visualized upper abdomen is grossly unremarkable, noting vascular calcifications. Musculoskeletal: Degenerative changes of the visualized thoracolumbar spine. IMPRESSION: Right upper lobe opacity, suspicious for pneumonia. Moderate right and small left pleural effusions. Suspected mild superimposed pulmonary edema. 4 mm ground-glass nodule in the anterior right upper lobe, possibly related to the suspected edema, but warranting attention on follow-up.  Consider follow-up CT chest in 3-6 months. No findings specific for metastatic disease in the chest. Aortic Atherosclerosis (ICD10-I70.0). Electronically Signed   By: Julian Hy M.D.   On: 04/07/2021 21:12   MR PELVIS W WO CONTRAST  Result Date: 04/08/2021 CLINICAL DATA:  Rectal mass on colonoscopy, suspected rectal cancer, metastatic disease evaluation EXAM: MRI ABDOMEN AND PELVIS WITHOUT AND WITH CONTRAST TECHNIQUE: Multiplanar multisequence MR imaging of the abdomen and pelvis was performed both before and after the administration of intravenous contrast. CONTRAST:  2mL GADAVIST GADOBUTROL 1 MMOL/ML IV SOLN COMPARISON:  None. FINDINGS: Markedly limited evaluation due to motion degradation. Lower chest: Moderate right and small left pleural effusions. Hepatobiliary: No focal hepatic lesion is seen, noting motion degradation. Gallbladder is grossly unremarkable. No intrahepatic or extrahepatic ductal dilatation. Pancreas:  Grossly unremarkable. Spleen:  Grossly unremarkable. Adrenals/Urinary Tract:  Adrenal glands are grossly unremarkable. Subcentimeter bilateral renal cysts.  No hydronephrosis. Trabeculated bladder. Stomach/Bowel: Stomach is grossly unremarkable. No evidence of bowel obstruction. Enhancing masslike concentric  thickening involving the lower rectum (series 29/image 30), corresponding to the recently biopsied rectal Mass. Vascular/Lymphatic:  No evidence of aneurysm. Marked limited evaluation for lymph nodes given motion degradation. Reproductive: Prostate is grossly unremarkable. Other:  No abdominopelvic ascites. Musculoskeletal: No focal osseous lesions. IMPRESSION: Markedly limited evaluation due to motion degradation. Enhancing, masslike concentric wall thickening involving the lower rectum, corresponding to recently biopsied rectal mass. No definite findings of metastatic disease, noting significant limitations. Assuming this patient has rectal adenocarcinoma, for more accurate staging, consider follow-up CT abdomen/pelvis with contrast in 6 weeks. Electronically Signed   By: Julian Hy M.D.   On: 04/08/2021 20:10   MR ABDOMEN W WO CONTRAST  Result Date: 04/08/2021 CLINICAL DATA:  Rectal mass on colonoscopy, suspected rectal cancer, metastatic disease evaluation EXAM: MRI ABDOMEN AND PELVIS WITHOUT AND WITH CONTRAST TECHNIQUE: Multiplanar multisequence MR imaging of the abdomen and pelvis was performed both before and after the administration of intravenous contrast. CONTRAST:  28mL GADAVIST GADOBUTROL 1 MMOL/ML IV SOLN COMPARISON:  None. FINDINGS: Markedly limited evaluation due to motion degradation. Lower chest: Moderate right and small left pleural effusions. Hepatobiliary: No focal hepatic lesion is seen, noting motion degradation. Gallbladder is grossly unremarkable. No intrahepatic or extrahepatic ductal dilatation. Pancreas:  Grossly unremarkable. Spleen:  Grossly unremarkable. Adrenals/Urinary Tract:  Adrenal glands are grossly unremarkable. Subcentimeter bilateral renal cysts.  No hydronephrosis. Trabeculated bladder. Stomach/Bowel: Stomach is grossly unremarkable. No evidence of bowel obstruction. Enhancing masslike concentric thickening involving the lower rectum (series 29/image 30),  corresponding to the recently biopsied rectal mass. Vascular/Lymphatic:  No evidence of aneurysm. Markedly limited evaluation for lymph nodes given motion degradation. Reproductive: Prostate is grossly unremarkable. Other:  No abdominopelvic ascites. Musculoskeletal: No focal osseous lesions. IMPRESSION: Markedly limited evaluation due to motion degradation. Enhancing, masslike concentric wall thickening involving the lower rectum, corresponding to recently biopsied rectal mass. No definite findings of metastatic disease, noting significant limitations. Assuming this patient has rectal adenocarcinoma, for more accurate staging, consider follow-up CT abdomen/pelvis with contrast in 6 weeks. Electronically Signed   By: Julian Hy M.D.   On: 04/08/2021 20:03   DG Chest Port 1 View  Result Date: 04/01/2021 CLINICAL DATA:  70 year old male with history of shortness of breath. EXAM: PORTABLE CHEST 1 VIEW COMPARISON:  Chest x-ray 03/20/2021. FINDINGS: Persistent consolidation in the inferior aspect of the right upper lobe. Patchy areas of interstitial prominence and several ill-defined opacities are also now noted elsewhere throughout the lungs bilaterally. Small  right pleural effusion. No left pleural effusion. No pneumothorax. No cephalization of the pulmonary vasculature. Heart size is mildly enlarged. The patient is rotated to the right on today's exam, resulting in distortion of the mediastinal contours and reduced diagnostic sensitivity and specificity for mediastinal pathology. Atherosclerotic calcifications in the thoracic aorta. IMPRESSION: 1. Slight worsening consolidation in the right upper lobe, compatible with pneumonia. There is also evidence to suggest developing multilobar bilateral bronchopneumonia, as above. 2. Cardiomegaly. 3. Aortic atherosclerosis. Electronically Signed   By: Vinnie Langton M.D.   On: 04/01/2021 06:25    PERFORMANCE STATUS (ECOG) : 3 - Symptomatic, >50% confined to  bed  Review of Systems  Constitutional:  Positive for fatigue.  Gastrointestinal:  Positive for constipation.  Neurological:  Positive for weakness.  Unless otherwise noted, a complete review of systems is negative.  Physical Exam General: NAD, weak, ill appearing  HEENT: right eye conjunctiva redness Cardiovascular: regular rate and rhythm Pulmonary: diminished, 4L/[place] Abdomen: soft, nontender, + bowel sounds Extremities: no edema, no joint deformities Skin: no rashes Neurological: Weakness but otherwise nonfocal, AAOx 3, mood appropriate   IMPRESSION:  I initially saw Mr. Boutwell during his recent hospitalization (which he recalls).  I re-introduced myself and Palliative's role in collaboration with his Oncology/Radiation Onc team. He verbalized understanding and appreciation.   Mr. Maneri shares since his recent discharge he continues to feel increasingly tired and weak.  He continues to be limited in his ability to leave his home due to transportation issues.  He states his friend Verdene Lennert has been helping with errands including grocery shopping and picking up any medications that he may need.  Patient states his pain is well managed at this time.  Feels when he takes his oxycodone it is effective.  States he does not have to take daily.  His appetite remains marginal as he emphasizes he is more accustomed to African foods and this has been hard to obtain.  He continues to have some constipation although he feels it is much improved.  Discussed at length bowel regimen.  I created space and opportunity for patient to express his feelings in regards to his current illness, comorbidities and disease trajectory.  Mr. Hoffmeier shares he knows that his life is limited and he is at peace with this.  He is remaining hopeful radiation will allow him more time with awareness he is not in a condition to be considered for chemotherapy.  He states he is fine with this as he does not feel like his body will be  able to tolerate chemotherapy and he does not think he would want to pursue if he had the option.  Mr. Cannedy is clear in his expressed goals to continue to treat the treatable allow him every opportunity to continue to thrive and have a decent quality of life.  He does not wish to suffer and wishes for his symptoms to be managed aggressively even if that means he is facing end-of-life.  I approached the discussion regarding CODE STATUS.  During my visit while he was hospitalized he expressed wishes for DNR/DNI however is now listed as a full code.  Patient states his brother expressed that he should at least have her rolled measures attempted and not to just give up.  I acknowledged his brothers suggestions while empathetically offering support to patient focusing on his wishes.  He understands he is able to make decisions for himself and seems to have a clear understanding of his goals.  Recommendations  for DNR was provided.  Patient verbalized understanding expressing allowing him time to think about his wishes as he does not want to upset his brother given he is his only support.  All questions answered and support provided.  PLAN: Continue MiraLAX daily for constipation Oxycodone as needed for pain.  Patient states he feels this medication is effective with no desire changes.  Unsure if he needs a refill we will plan to call back if so. [place] 2 was completed per request for consideration of SNF.  Document was faxed to Marshia Ly der Lehr at [phone].  Patient verbalized understanding expressing he had spoken with a Education officer, museum regarding possible placement due to continued weakness and living in the home alone. Ongoing goals of care discussions.  Patient initially requested DNR however states his brother disagreed persuading him to remain a full code. We will plan to follow-up in 1 week in collaboration with radiation schedule for ongoing goals of care discussion and symptom  management.   Patient expressed understanding and was in agreement with this plan. He also understands that He can call the clinic at any time with any questions, concerns, or complaints.     Time Total: 45 min.   Visit consisted of counseling and education dealing with the complex and emotionally intense issues of symptom management and palliative care in the setting of serious and potentially life-threatening illness.Greater than 50%  of this time was spent counseling and coordinating care related to the above assessment and plan.  Signed by: Alda Lea, AGPCNP-BC Palliative Medicine Team

## 2021-04-20 ENCOUNTER — Ambulatory Visit
Admission: RE | Admit: 2021-04-20 | Discharge: 2021-04-20 | Disposition: A | Discharge status: Home / Self Care | Payer: PPO | Source: Ambulatory Visit | Attending: Radiation Oncology | Admitting: Radiation Oncology

## 2021-04-20 ENCOUNTER — Other Ambulatory Visit: Payer: Self-pay

## 2021-04-20 ENCOUNTER — Telehealth: Payer: Self-pay | Admitting: Nurse Practitioner

## 2021-04-20 DIAGNOSIS — C20 Malignant neoplasm of rectum: Secondary | ICD-10-CM | POA: Diagnosis not present

## 2021-04-20 DIAGNOSIS — Z510 Encounter for antineoplastic radiation therapy: Secondary | ICD-10-CM | POA: Diagnosis not present

## 2021-04-20 NOTE — Telephone Encounter (Signed)
Scheduled per 10/27 los, pt has been called and is aware of appt, pt will call back if he needs to reschedule

## 2021-04-21 DIAGNOSIS — I5032 Chronic diastolic (congestive) heart failure: Secondary | ICD-10-CM | POA: Diagnosis not present

## 2021-04-21 DIAGNOSIS — G4733 Obstructive sleep apnea (adult) (pediatric): Secondary | ICD-10-CM | POA: Diagnosis not present

## 2021-04-23 ENCOUNTER — Other Ambulatory Visit: Payer: Self-pay | Admitting: Nurse Practitioner

## 2021-04-23 ENCOUNTER — Other Ambulatory Visit: Payer: Self-pay

## 2021-04-23 ENCOUNTER — Ambulatory Visit
Admission: RE | Admit: 2021-04-23 | Discharge: 2021-04-23 | Disposition: A | Discharge status: Home / Self Care | Payer: PPO | Source: Ambulatory Visit | Attending: Radiation Oncology | Admitting: Radiation Oncology

## 2021-04-23 DIAGNOSIS — Z510 Encounter for antineoplastic radiation therapy: Secondary | ICD-10-CM | POA: Diagnosis not present

## 2021-04-23 DIAGNOSIS — C20 Malignant neoplasm of rectum: Secondary | ICD-10-CM

## 2021-04-23 DIAGNOSIS — J9621 Acute and chronic respiratory failure with hypoxia: Secondary | ICD-10-CM

## 2021-04-24 ENCOUNTER — Ambulatory Visit
Admission: RE | Admit: 2021-04-24 | Discharge: 2021-04-24 | Disposition: A | Discharge status: Home / Self Care | Payer: PPO | Source: Ambulatory Visit | Attending: Radiation Oncology | Admitting: Radiation Oncology

## 2021-04-24 DIAGNOSIS — C20 Malignant neoplasm of rectum: Secondary | ICD-10-CM | POA: Insufficient documentation

## 2021-04-24 DIAGNOSIS — Z510 Encounter for antineoplastic radiation therapy: Secondary | ICD-10-CM | POA: Insufficient documentation

## 2021-04-25 ENCOUNTER — Ambulatory Visit: Payer: PPO

## 2021-04-25 ENCOUNTER — Ambulatory Visit
Admission: RE | Admit: 2021-04-25 | Discharge: 2021-04-25 | Disposition: A | Discharge status: Home / Self Care | Payer: PPO | Source: Ambulatory Visit | Attending: Radiation Oncology | Admitting: Radiation Oncology

## 2021-04-25 ENCOUNTER — Other Ambulatory Visit: Payer: Self-pay

## 2021-04-25 DIAGNOSIS — Z510 Encounter for antineoplastic radiation therapy: Secondary | ICD-10-CM | POA: Diagnosis not present

## 2021-04-25 DIAGNOSIS — C20 Malignant neoplasm of rectum: Secondary | ICD-10-CM | POA: Diagnosis not present

## 2021-04-26 ENCOUNTER — Ambulatory Visit
Admission: RE | Admit: 2021-04-26 | Discharge: 2021-04-26 | Disposition: A | Discharge status: Home / Self Care | Payer: PPO | Source: Ambulatory Visit | Attending: Radiation Oncology | Admitting: Radiation Oncology

## 2021-04-26 DIAGNOSIS — Z510 Encounter for antineoplastic radiation therapy: Secondary | ICD-10-CM | POA: Diagnosis not present

## 2021-04-26 DIAGNOSIS — C20 Malignant neoplasm of rectum: Secondary | ICD-10-CM | POA: Diagnosis not present

## 2021-04-27 ENCOUNTER — Ambulatory Visit: Payer: PPO

## 2021-04-27 ENCOUNTER — Telehealth: Payer: Self-pay

## 2021-04-27 ENCOUNTER — Inpatient Hospital Stay: Payer: PPO | Admitting: Nurse Practitioner

## 2021-04-27 NOTE — Telephone Encounter (Signed)
Received call from patient that he would not be able to come for his final treatment today due to frequent bowel movements. Agreeable to move appointment to Monday 04/30/21. Informed him I would call back once I had spoken with L2 therapist as to when his new appointment time would be.   Spoke with Magda Paganini on L2 and received new appointment time for patient. Called Mr. Bruun back and relayed new appointment date/time. Patient verbalized agreement and appreciation of call. He has my direct call back number should he have any other questions/concerns.   Update sent via secure chat to Palliative Care team since patient was also scheduled for a visit in their department. They will plan to see patient on Monday after his final radiation treatment
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
## 2021-04-30 ENCOUNTER — Ambulatory Visit: Payer: PPO

## 2021-04-30 ENCOUNTER — Encounter: Payer: PPO | Admitting: Nurse Practitioner

## 2021-04-30 ENCOUNTER — Ambulatory Visit (HOSPITAL_BASED_OUTPATIENT_CLINIC_OR_DEPARTMENT_OTHER): Payer: PPO | Admitting: Family

## 2021-04-30 ENCOUNTER — Telehealth: Payer: Self-pay

## 2021-04-30 DIAGNOSIS — G4733 Obstructive sleep apnea (adult) (pediatric): Secondary | ICD-10-CM | POA: Diagnosis not present

## 2021-04-30 NOTE — Telephone Encounter (Signed)
Called to check on patient and he stated he still did not feel comfortable coming for radiation treatment today with his continued diarrhea. Asked patient if he had a way to obtain any OTC-Imodium (or have a friend/family member pick some up on his behalf). He stated no, but confirmed that he was drinking lots of fluids and eating small/frequent meals. Informed patient I would ask therapists to move his radiation appointment to tomorrow and see when Palliative Care team also had availability to see him. Patient verbalized understanding and agreement  Spoke with Palliative Care team and L2 therapists, patient will meet with NP tomorrow at 3pm, and then have his final radiation treatment at 3:45. Called patient and relayed update. Asked patient to call me directly before 12pm tomorrow if he had any concerns or needed to make any changes. Patient verbalized agreement and appreciation of call. No other needs identified at this time

## 2021-05-01 ENCOUNTER — Inpatient Hospital Stay: Payer: PPO | Attending: Nurse Practitioner | Admitting: Nurse Practitioner

## 2021-05-01 ENCOUNTER — Other Ambulatory Visit: Payer: Self-pay

## 2021-05-01 ENCOUNTER — Emergency Department (HOSPITAL_COMMUNITY): Payer: PPO

## 2021-05-01 ENCOUNTER — Inpatient Hospital Stay (HOSPITAL_COMMUNITY)
Admission: EM | Admit: 2021-05-01 | Discharge: 2021-05-16 | DRG: 291 | Disposition: A | Discharge status: Hospice | Payer: PPO | Attending: Internal Medicine | Admitting: Internal Medicine

## 2021-05-01 ENCOUNTER — Ambulatory Visit
Admission: RE | Admit: 2021-05-01 | Discharge: 2021-05-01 | Disposition: A | Discharge status: Home / Self Care | Payer: PPO | Source: Ambulatory Visit | Attending: Radiation Oncology | Admitting: Radiation Oncology

## 2021-05-01 ENCOUNTER — Encounter (HOSPITAL_COMMUNITY): Payer: Self-pay

## 2021-05-01 ENCOUNTER — Encounter: Payer: Self-pay | Admitting: Radiation Oncology

## 2021-05-01 VITALS — BP 209/108 | HR 77

## 2021-05-01 DIAGNOSIS — Z9981 Dependence on supplemental oxygen: Secondary | ICD-10-CM | POA: Diagnosis not present

## 2021-05-01 DIAGNOSIS — Z515 Encounter for palliative care: Secondary | ICD-10-CM

## 2021-05-01 DIAGNOSIS — H409 Unspecified glaucoma: Secondary | ICD-10-CM | POA: Diagnosis present

## 2021-05-01 DIAGNOSIS — Z20822 Contact with and (suspected) exposure to covid-19: Secondary | ICD-10-CM | POA: Diagnosis present

## 2021-05-01 DIAGNOSIS — I509 Heart failure, unspecified: Secondary | ICD-10-CM

## 2021-05-01 DIAGNOSIS — N184 Chronic kidney disease, stage 4 (severe): Secondary | ICD-10-CM | POA: Diagnosis not present

## 2021-05-01 DIAGNOSIS — G4733 Obstructive sleep apnea (adult) (pediatric): Secondary | ICD-10-CM | POA: Diagnosis not present

## 2021-05-01 DIAGNOSIS — Z7189 Other specified counseling: Secondary | ICD-10-CM | POA: Diagnosis not present

## 2021-05-01 DIAGNOSIS — I517 Cardiomegaly: Secondary | ICD-10-CM | POA: Diagnosis not present

## 2021-05-01 DIAGNOSIS — I160 Hypertensive urgency: Secondary | ICD-10-CM

## 2021-05-01 DIAGNOSIS — R079 Chest pain, unspecified: Secondary | ICD-10-CM | POA: Diagnosis not present

## 2021-05-01 DIAGNOSIS — K219 Gastro-esophageal reflux disease without esophagitis: Secondary | ICD-10-CM | POA: Diagnosis present

## 2021-05-01 DIAGNOSIS — R7989 Other specified abnormal findings of blood chemistry: Secondary | ICD-10-CM | POA: Diagnosis present

## 2021-05-01 DIAGNOSIS — K922 Gastrointestinal hemorrhage, unspecified: Secondary | ICD-10-CM | POA: Diagnosis not present

## 2021-05-01 DIAGNOSIS — R110 Nausea: Secondary | ICD-10-CM | POA: Diagnosis not present

## 2021-05-01 DIAGNOSIS — I5030 Unspecified diastolic (congestive) heart failure: Secondary | ICD-10-CM | POA: Diagnosis not present

## 2021-05-01 DIAGNOSIS — Z79899 Other long term (current) drug therapy: Secondary | ICD-10-CM

## 2021-05-01 DIAGNOSIS — R4182 Altered mental status, unspecified: Secondary | ICD-10-CM | POA: Diagnosis not present

## 2021-05-01 DIAGNOSIS — I248 Other forms of acute ischemic heart disease: Secondary | ICD-10-CM | POA: Diagnosis not present

## 2021-05-01 DIAGNOSIS — R195 Other fecal abnormalities: Secondary | ICD-10-CM

## 2021-05-01 DIAGNOSIS — R531 Weakness: Secondary | ICD-10-CM | POA: Diagnosis not present

## 2021-05-01 DIAGNOSIS — J9621 Acute and chronic respiratory failure with hypoxia: Secondary | ICD-10-CM | POA: Diagnosis not present

## 2021-05-01 DIAGNOSIS — E1122 Type 2 diabetes mellitus with diabetic chronic kidney disease: Secondary | ICD-10-CM

## 2021-05-01 DIAGNOSIS — J9611 Chronic respiratory failure with hypoxia: Secondary | ICD-10-CM | POA: Diagnosis not present

## 2021-05-01 DIAGNOSIS — I161 Hypertensive emergency: Secondary | ICD-10-CM | POA: Diagnosis not present

## 2021-05-01 DIAGNOSIS — G893 Neoplasm related pain (acute) (chronic): Secondary | ICD-10-CM | POA: Diagnosis not present

## 2021-05-01 DIAGNOSIS — I110 Hypertensive heart disease with heart failure: Secondary | ICD-10-CM | POA: Diagnosis not present

## 2021-05-01 DIAGNOSIS — Z794 Long term (current) use of insulin: Secondary | ICD-10-CM | POA: Diagnosis not present

## 2021-05-01 DIAGNOSIS — J9811 Atelectasis: Secondary | ICD-10-CM | POA: Diagnosis not present

## 2021-05-01 DIAGNOSIS — E782 Mixed hyperlipidemia: Secondary | ICD-10-CM | POA: Diagnosis present

## 2021-05-01 DIAGNOSIS — R197 Diarrhea, unspecified: Secondary | ICD-10-CM

## 2021-05-01 DIAGNOSIS — I169 Hypertensive crisis, unspecified: Secondary | ICD-10-CM | POA: Diagnosis not present

## 2021-05-01 DIAGNOSIS — E1129 Type 2 diabetes mellitus with other diabetic kidney complication: Secondary | ICD-10-CM | POA: Diagnosis not present

## 2021-05-01 DIAGNOSIS — D630 Anemia in neoplastic disease: Secondary | ICD-10-CM | POA: Diagnosis present

## 2021-05-01 DIAGNOSIS — R778 Other specified abnormalities of plasma proteins: Secondary | ICD-10-CM | POA: Diagnosis present

## 2021-05-01 DIAGNOSIS — I130 Hypertensive heart and chronic kidney disease with heart failure and stage 1 through stage 4 chronic kidney disease, or unspecified chronic kidney disease: Principal | ICD-10-CM | POA: Diagnosis present

## 2021-05-01 DIAGNOSIS — I10 Essential (primary) hypertension: Secondary | ICD-10-CM | POA: Diagnosis not present

## 2021-05-01 DIAGNOSIS — N179 Acute kidney failure, unspecified: Secondary | ICD-10-CM | POA: Diagnosis present

## 2021-05-01 DIAGNOSIS — M109 Gout, unspecified: Secondary | ICD-10-CM | POA: Diagnosis present

## 2021-05-01 DIAGNOSIS — D500 Iron deficiency anemia secondary to blood loss (chronic): Secondary | ICD-10-CM | POA: Diagnosis not present

## 2021-05-01 DIAGNOSIS — N170 Acute kidney failure with tubular necrosis: Secondary | ICD-10-CM | POA: Diagnosis not present

## 2021-05-01 DIAGNOSIS — Z66 Do not resuscitate: Secondary | ICD-10-CM | POA: Diagnosis present

## 2021-05-01 DIAGNOSIS — I421 Obstructive hypertrophic cardiomyopathy: Secondary | ICD-10-CM | POA: Diagnosis not present

## 2021-05-01 DIAGNOSIS — I959 Hypotension, unspecified: Secondary | ICD-10-CM | POA: Diagnosis present

## 2021-05-01 DIAGNOSIS — I2510 Atherosclerotic heart disease of native coronary artery without angina pectoris: Secondary | ICD-10-CM | POA: Diagnosis present

## 2021-05-01 DIAGNOSIS — Z833 Family history of diabetes mellitus: Secondary | ICD-10-CM

## 2021-05-01 DIAGNOSIS — I422 Other hypertrophic cardiomyopathy: Secondary | ICD-10-CM | POA: Diagnosis present

## 2021-05-01 DIAGNOSIS — C20 Malignant neoplasm of rectum: Secondary | ICD-10-CM | POA: Diagnosis not present

## 2021-05-01 DIAGNOSIS — I5023 Acute on chronic systolic (congestive) heart failure: Secondary | ICD-10-CM | POA: Diagnosis not present

## 2021-05-01 DIAGNOSIS — J189 Pneumonia, unspecified organism: Secondary | ICD-10-CM | POA: Diagnosis not present

## 2021-05-01 DIAGNOSIS — J90 Pleural effusion, not elsewhere classified: Secondary | ICD-10-CM | POA: Diagnosis not present

## 2021-05-01 DIAGNOSIS — I5033 Acute on chronic diastolic (congestive) heart failure: Secondary | ICD-10-CM | POA: Diagnosis present

## 2021-05-01 DIAGNOSIS — I5032 Chronic diastolic (congestive) heart failure: Secondary | ICD-10-CM | POA: Diagnosis not present

## 2021-05-01 DIAGNOSIS — D649 Anemia, unspecified: Secondary | ICD-10-CM | POA: Diagnosis not present

## 2021-05-01 DIAGNOSIS — Z8249 Family history of ischemic heart disease and other diseases of the circulatory system: Secondary | ICD-10-CM

## 2021-05-01 DIAGNOSIS — I700 Atherosclerosis of aorta: Secondary | ICD-10-CM | POA: Diagnosis not present

## 2021-05-01 DIAGNOSIS — Z9989 Dependence on other enabling machines and devices: Secondary | ICD-10-CM | POA: Diagnosis not present

## 2021-05-01 LAB — I-STAT CHEM 8, ED
BUN: 44 mg/dL — ABNORMAL HIGH (ref 8–23)
Calcium, Ion: 1.29 mmol/L (ref 1.15–1.40)
Chloride: 109 mmol/L (ref 98–111)
Creatinine, Ser: 2.6 mg/dL — ABNORMAL HIGH (ref 0.61–1.24)
Glucose, Bld: 124 mg/dL — ABNORMAL HIGH (ref 70–99)
HCT: 37 % — ABNORMAL LOW (ref 39.0–52.0)
Hemoglobin: 12.6 g/dL — ABNORMAL LOW (ref 13.0–17.0)
Potassium: 5 mmol/L (ref 3.5–5.1)
Sodium: 144 mmol/L (ref 135–145)
TCO2: 30 mmol/L (ref 22–32)

## 2021-05-01 LAB — CBC WITH DIFFERENTIAL/PLATELET
Abs Immature Granulocytes: 0.02 10*3/uL (ref 0.00–0.07)
Basophils Absolute: 0 10*3/uL (ref 0.0–0.1)
Basophils Relative: 0 %
Eosinophils Absolute: 0 10*3/uL (ref 0.0–0.5)
Eosinophils Relative: 1 %
HCT: 36.7 % — ABNORMAL LOW (ref 39.0–52.0)
Hemoglobin: 10.9 g/dL — ABNORMAL LOW (ref 13.0–17.0)
Immature Granulocytes: 0 %
Lymphocytes Relative: 18 %
Lymphs Abs: 0.8 10*3/uL (ref 0.7–4.0)
MCH: 29.5 pg (ref 26.0–34.0)
MCHC: 29.7 g/dL — ABNORMAL LOW (ref 30.0–36.0)
MCV: 99.2 fL (ref 80.0–100.0)
Monocytes Absolute: 0.4 10*3/uL (ref 0.1–1.0)
Monocytes Relative: 8 %
Neutro Abs: 3.4 10*3/uL (ref 1.7–7.7)
Neutrophils Relative %: 73 %
Platelets: 209 10*3/uL (ref 150–400)
RBC: 3.7 MIL/uL — ABNORMAL LOW (ref 4.22–5.81)
RDW: 18.3 % — ABNORMAL HIGH (ref 11.5–15.5)
WBC: 4.7 10*3/uL (ref 4.0–10.5)
nRBC: 0 % (ref 0.0–0.2)

## 2021-05-01 LAB — URINALYSIS, ROUTINE W REFLEX MICROSCOPIC
Bacteria, UA: NONE SEEN
Bilirubin Urine: NEGATIVE
Glucose, UA: NEGATIVE mg/dL
Ketones, ur: NEGATIVE mg/dL
Leukocytes,Ua: NEGATIVE
Nitrite: NEGATIVE
Protein, ur: >=300 mg/dL — AB
Specific Gravity, Urine: 1.011 (ref 1.005–1.030)
pH: 7 (ref 5.0–8.0)

## 2021-05-01 LAB — POC OCCULT BLOOD, ED: Fecal Occult Bld: POSITIVE — AB

## 2021-05-01 LAB — TROPONIN I (HIGH SENSITIVITY)
Troponin I (High Sensitivity): 113 ng/L (ref <18)
Troponin I (High Sensitivity): 114 ng/L (ref <18)

## 2021-05-01 LAB — RESP PANEL BY RT-PCR (FLU A&B, COVID) ARPGX2
Influenza A by PCR: NEGATIVE
Influenza B by PCR: NEGATIVE
SARS Coronavirus 2 by RT PCR: NEGATIVE

## 2021-05-01 LAB — COMPREHENSIVE METABOLIC PANEL
ALT: 26 U/L (ref 0–44)
AST: 21 U/L (ref 15–41)
Albumin: 3.4 g/dL — ABNORMAL LOW (ref 3.5–5.0)
Alkaline Phosphatase: 106 U/L (ref 38–126)
Anion gap: 5 (ref 5–15)
BUN: 41 mg/dL — ABNORMAL HIGH (ref 8–23)
CO2: 28 mmol/L (ref 22–32)
Calcium: 9.4 mg/dL (ref 8.9–10.3)
Chloride: 108 mmol/L (ref 98–111)
Creatinine, Ser: 2.41 mg/dL — ABNORMAL HIGH (ref 0.61–1.24)
GFR, Estimated: 28 mL/min — ABNORMAL LOW (ref >60)
Glucose, Bld: 128 mg/dL — ABNORMAL HIGH (ref 70–99)
Potassium: 4.9 mmol/L (ref 3.5–5.1)
Sodium: 141 mmol/L (ref 135–145)
Total Bilirubin: 0.8 mg/dL (ref 0.3–1.2)
Total Protein: 8.2 g/dL — ABNORMAL HIGH (ref 6.5–8.1)

## 2021-05-01 LAB — LACTIC ACID, PLASMA: Lactic Acid, Venous: 1.2 mmol/L (ref 0.5–1.9)

## 2021-05-01 LAB — CBG MONITORING, ED: Glucose-Capillary: 114 mg/dL — ABNORMAL HIGH (ref 70–99)

## 2021-05-01 LAB — BRAIN NATRIURETIC PEPTIDE: B Natriuretic Peptide: 3367.1 pg/mL — ABNORMAL HIGH (ref 0.0–100.0)

## 2021-05-01 MED ORDER — ATORVASTATIN CALCIUM 20 MG PO TABS
20.0000 mg | ORAL_TABLET | Freq: Every day | ORAL | Status: DC
  Administered 2021-05-02 – 2021-05-16 (×15): 20 mg via ORAL
  Filled 2021-05-01 (×4): qty 1
  Filled 2021-05-01: qty 2
  Filled 2021-05-01 (×11): qty 1

## 2021-05-01 MED ORDER — ISOSORBIDE MONONITRATE ER 60 MG PO TB24
60.0000 mg | ORAL_TABLET | Freq: Every day | ORAL | Status: DC
  Administered 2021-05-02: 60 mg via ORAL
  Filled 2021-05-01: qty 1

## 2021-05-01 MED ORDER — ENALAPRILAT 1.25 MG/ML IV SOLN: 1.2500 mg | Freq: Four times a day (QID) | INTRAVENOUS | Status: DC | PRN

## 2021-05-01 MED ORDER — INSULIN GLARGINE-YFGN 100 UNIT/ML ~~LOC~~ SOLN
10.0000 [IU] | Freq: Every day | SUBCUTANEOUS | Status: DC
  Administered 2021-05-02 – 2021-05-16 (×15): 10 [IU] via SUBCUTANEOUS
  Filled 2021-05-01 (×15): qty 0.1

## 2021-05-01 MED ORDER — AMLODIPINE BESYLATE 10 MG PO TABS
10.0000 mg | ORAL_TABLET | Freq: Every day | ORAL | Status: DC
  Administered 2021-05-02 – 2021-05-16 (×15): 10 mg via ORAL
  Filled 2021-05-01 (×12): qty 1
  Filled 2021-05-01: qty 2
  Filled 2021-05-01 (×3): qty 1

## 2021-05-01 MED ORDER — INSULIN GLARGINE 100 UNIT/ML SOLOSTAR PEN: 10.0000 [IU] | PEN_INJECTOR | Freq: Every day | SUBCUTANEOUS | Status: DC

## 2021-05-01 MED ORDER — DOXAZOSIN MESYLATE 2 MG PO TABS
2.0000 mg | ORAL_TABLET | Freq: Every day | ORAL | Status: DC
  Administered 2021-05-02 – 2021-05-15 (×15): 2 mg via ORAL
  Filled 2021-05-01 (×17): qty 1

## 2021-05-01 MED ORDER — PANTOPRAZOLE SODIUM 40 MG PO TBEC
40.0000 mg | DELAYED_RELEASE_TABLET | Freq: Two times a day (BID) | ORAL | Status: DC
  Administered 2021-05-02 – 2021-05-16 (×29): 40 mg via ORAL
  Filled 2021-05-01 (×29): qty 1

## 2021-05-01 MED ORDER — LATANOPROST 0.005 % OP SOLN
1.0000 [drp] | Freq: Every day | OPHTHALMIC | Status: DC
  Administered 2021-05-02 – 2021-05-15 (×15): 1 [drp] via OPHTHALMIC
  Filled 2021-05-01: qty 2.5

## 2021-05-01 MED ORDER — NITROGLYCERIN 0.4 MG SL SUBL
0.4000 mg | SUBLINGUAL_TABLET | SUBLINGUAL | Status: DC | PRN
  Administered 2021-05-02 – 2021-05-11 (×5): 0.4 mg via SUBLINGUAL
  Filled 2021-05-01 (×6): qty 1

## 2021-05-01 MED ORDER — NITROGLYCERIN 2 % TD OINT
1.0000 [in_us] | TOPICAL_OINTMENT | Freq: Once | TRANSDERMAL | Status: AC
  Administered 2021-05-01: 1 [in_us] via TOPICAL
  Filled 2021-05-01: qty 1

## 2021-05-01 MED ORDER — BRIMONIDINE TARTRATE 0.2 % OP SOLN
1.0000 [drp] | Freq: Two times a day (BID) | OPHTHALMIC | Status: DC
  Administered 2021-05-02 – 2021-05-16 (×28): 1 [drp] via OPHTHALMIC
  Filled 2021-05-01 (×2): qty 5

## 2021-05-01 MED ORDER — HYDRALAZINE HCL 50 MG PO TABS
100.0000 mg | ORAL_TABLET | Freq: Three times a day (TID) | ORAL | Status: DC
  Administered 2021-05-02 – 2021-05-16 (×43): 100 mg via ORAL
  Filled 2021-05-01 (×46): qty 2

## 2021-05-01 MED ORDER — DORZOLAMIDE HCL 2 % OP SOLN
1.0000 [drp] | Freq: Two times a day (BID) | OPHTHALMIC | Status: DC
  Administered 2021-05-02 – 2021-05-16 (×28): 1 [drp] via OPHTHALMIC
  Filled 2021-05-01 (×2): qty 10

## 2021-05-01 MED ORDER — LABETALOL HCL 5 MG/ML IV SOLN
20.0000 mg | Freq: Once | INTRAVENOUS | Status: AC
  Administered 2021-05-01: 20 mg via INTRAVENOUS
  Filled 2021-05-01: qty 4

## 2021-05-01 MED ORDER — HYDRALAZINE HCL 25 MG PO TABS
100.0000 mg | ORAL_TABLET | Freq: Once | ORAL | Status: AC
  Administered 2021-05-01: 100 mg via ORAL
  Filled 2021-05-01: qty 4

## 2021-05-01 MED ORDER — CARVEDILOL 25 MG PO TABS
25.0000 mg | ORAL_TABLET | Freq: Two times a day (BID) | ORAL | Status: DC
  Administered 2021-05-01 – 2021-05-16 (×28): 25 mg via ORAL
  Filled 2021-05-01 (×6): qty 1
  Filled 2021-05-01: qty 2
  Filled 2021-05-01 (×12): qty 1
  Filled 2021-05-01: qty 2
  Filled 2021-05-01 (×8): qty 1

## 2021-05-01 MED ORDER — DOCUSATE SODIUM 100 MG PO CAPS
200.0000 mg | ORAL_CAPSULE | Freq: Two times a day (BID) | ORAL | Status: DC
  Administered 2021-05-02 – 2021-05-11 (×18): 200 mg via ORAL
  Filled 2021-05-01 (×21): qty 2

## 2021-05-01 MED ORDER — FUROSEMIDE 10 MG/ML IJ SOLN
40.0000 mg | Freq: Two times a day (BID) | INTRAMUSCULAR | Status: DC
  Administered 2021-05-02 – 2021-05-03 (×3): 40 mg via INTRAVENOUS
  Filled 2021-05-01 (×3): qty 4

## 2021-05-01 MED ORDER — ENALAPRILAT 1.25 MG/ML IV SOLN
0.6250 mg | Freq: Four times a day (QID) | INTRAVENOUS | Status: DC | PRN
  Filled 2021-05-01: qty 0.5

## 2021-05-01 MED ORDER — FUROSEMIDE 10 MG/ML IJ SOLN
40.0000 mg | Freq: Once | INTRAMUSCULAR | Status: AC
  Administered 2021-05-01: 40 mg via INTRAVENOUS
  Filled 2021-05-01: qty 4

## 2021-05-01 NOTE — Assessment & Plan Note (Addendum)
-   on chronic 4L [place] O2 at home
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
## 2021-05-01 NOTE — Progress Notes (Signed)
[place]  [phone] [phone]) [phone]   Name: CONARD ALVIRA Date: 05/01/2021 MRN: [id number]  DOB: 10-Apr-1951  Patient Care Team: [name], MD as PCP - General (Internal Medicine) Skeet Latch, MD as PCP - Cardiology (Cardiology) Skeet Latch, MD as Consulting Physician (Cardiology)    [name] is a 70 y.o. male with multiple medical problems including newly diagnosed rectal adenocarcinoma (currently receiving radiation), diabetes, diastolic CHF, chronic hypoxic respiratory failure (4 L home oxygen), hypertension, hyperlipidemia, CKD stage IV, and glaucoma. .  Palliative ask to see for symptom management and goals of care.    SOCIAL HISTORY:     reports that he has never smoked. He has never used smokeless tobacco. He reports that he does not drink alcohol and does not use drugs.  ADVANCE DIRECTIVES:  Patient does not have a completed advanced directive. Declines to complete however confirms his brother, Emric Kowalewski is his Scientist, research (medical).  CODE STATUS:   PAST MEDICAL HISTORY: Past Medical History:  Diagnosis Date   Altered mental status    a. 05/2017 - adm with blurred vision, somnolence in setting of AKI and high blood sugar.   Anemia    CKD (chronic kidney disease), stage III (HCC)    Diabetes mellitus    Diastolic CHF, chronic ([facility])    A.  03/2009 Echo: EF 60-65%, Gr II diast dysfxn   Elevated troponin    a. 2013 - troponin 1.4. b. 2019 - troponin 0.32; neg nuc 07/2017.   Glaucoma    Hyperlipidemia    HYPERCHOLESTEROLEMIA   Hypertension    MARKED LEFT VENTRICULAR HYPERTROPHY BY PREVIOUS ECHOCARDIOGRAM--HE HAS HYPERDYNAMIC LEFT VENTRICULAR SYSTOLIC FUNCTION AND HAS IMPAIRED RELAXATION BY ECHO   Hypertrophic cardiomyopathy (HCC)    NSVT (nonsustained ventricular tachycardia)    Premature atrial contractions    PVC's (premature ventricular contractions)    SVT (supraventricular  tachycardia) ([place])     PAST SURGICAL HISTORY:  Past Surgical History:  Procedure Laterality Date   BIOPSY  03/03/2021   Procedure: BIOPSY;  Surgeon: Jackquline Denmark, MD;  Location: [place];  Service: Endoscopy;;   BIOPSY  04/06/2021   Procedure: BIOPSY;  Surgeon: [name], MD;  Location: [place];  Service: Gastroenterology;;   BRONCHIAL BIOPSY  03/05/2021   Procedure: BRONCHIAL BIOPSIES;  Surgeon: Candee Furbish, MD;  Location: [facility] ENDOSCOPY;  Service: Pulmonary;;   BRONCHIAL BRUSHINGS  03/05/2021   Procedure: BRONCHIAL BRUSHINGS;  Surgeon: Candee Furbish, MD;  Location: [place];  Service: Pulmonary;;   BRONCHIAL WASHINGS  03/05/2021   Procedure: BRONCHIAL WASHINGS;  Surgeon: Candee Furbish, MD;  Location: [facility] ENDOSCOPY;  Service: Pulmonary;;   COLONOSCOPY WITH PROPOFOL N/A 04/06/2021   Procedure: COLONOSCOPY WITH PROPOFOL;  Surgeon: [name], MD;  Location: [place];  Service: Gastroenterology;  Laterality: N/A;   ESOPHAGOGASTRODUODENOSCOPY (EGD) WITH PROPOFOL N/A 03/03/2021   Procedure: ESOPHAGOGASTRODUODENOSCOPY (EGD) WITH PROPOFOL;  Surgeon: Jackquline Denmark, MD;  Location: [facility] ENDOSCOPY;  Service: Endoscopy;  Laterality: N/A;   HEMOSTASIS CONTROL  03/05/2021   Procedure: HEMOSTASIS CONTROL;  Surgeon: Candee Furbish, MD;  Location: [facility] ENDOSCOPY;  Service: Pulmonary;;   NO PAST SURGERIES     POLYPECTOMY  04/06/2021   Procedure: POLYPECTOMY;  Surgeon: [name], MD;  Location: [place];  Service: Gastroenterology;;   VIDEO BRONCHOSCOPY N/A 03/05/2021   Procedure: VIDEO BRONCHOSCOPY WITH FLUORO;  Surgeon: Candee Furbish, MD;  Location: [place];  Service: Pulmonary;  Laterality: N/A;    HEMATOLOGY/ONCOLOGY HISTORY:  Oncology History  Rectal adenocarcinoma ([place])  04/09/2021 Initial Diagnosis   Rectal adenocarcinoma ([place])   04/10/2021 Cancer Staging   Staging form: Colon and Rectum, AJCC 8th Edition - Clinical stage from 04/10/2021: Stage IIA  (cT3, cN0, cM0) - Signed by Eppie Gibson, MD on 04/11/2021 Total positive nodes: 0      ALLERGIES:  has No Known Allergies.  MEDICATIONS:  Current Outpatient Medications  Medication Sig Dispense Refill   amLODipine (NORVASC) 10 MG tablet Take 1 tablet (10 mg total) by mouth daily. 30 tablet 1   atorvastatin (LIPITOR) 20 MG tablet Take 1 tablet (20 mg total) by mouth daily. 90 tablet 2   brimonidine (ALPHAGAN) 0.2 % ophthalmic solution Place 1 drop into the right eye 2 (two) times daily.     carvedilol (COREG) 25 MG tablet Take 1 tablet (25 mg total) by mouth 2 (two) times daily. 60 tablet 0   cholecalciferol (VITAMIN D3) 25 MCG (1000 UT) tablet Take 1,000 Units by mouth daily.     Continuous Blood Gluc Sensor (DEXCOM G6 SENSOR) MISC 1 Device by Does not apply route as directed. 9 each 3   Continuous Blood Gluc Transmit (DEXCOM G6 TRANSMITTER) MISC 1 Device by Does not apply route as directed. 1 each 3   docusate sodium (COLACE) 100 MG capsule Take 2 capsules (200 mg total) by mouth 2 (two) times daily. 60 capsule 0   dorzolamide (TRUSOPT) 2 % ophthalmic solution Place 1 drop into both eyes 2 (two) times daily.     furosemide (LASIX) 40 MG tablet Take 1 tablet (40 mg total) by mouth 2 (two) times daily. 60 tablet 1   glucose blood (ONETOUCH VERIO) test strip USE 1 STRIP TO CHECK GLUCOSE THREE TIMES DAILY AS DIRECTED 100 each 0   hydrALAZINE (APRESOLINE) 100 MG tablet Take 1 tablet (100 mg total) by mouth 3 (three) times daily. 270 tablet 3   insulin glargine (LANTUS SOLOSTAR) 100 UNIT/ML Solostar Pen Inject 10 Units into the skin daily. 15 mL 11   Insulin Pen Needle 32G X 4 MM MISC Use as directed with insulin 100 each 0   isosorbide mononitrate (IMDUR) 60 MG 24 hr tablet Take 60 mg by mouth daily.     latanoprost (XALATAN) 0.005 % ophthalmic solution Place 1 drop into the left eye at bedtime.     nitroGLYCERIN (NITROSTAT) 0.4 MG SL tablet DISSOLVE ONE TABLET UNDER THE TONGUE EVERY 5  MINUTES AS NEEDED FOR CHEST PAIN. (Patient taking differently: Place 0.4 mg under the tongue every 5 (five) minutes as needed for chest pain.) 25 tablet 7   pantoprazole (PROTONIX) 40 MG tablet Take 1 tablet (40 mg total) by mouth 2 (two) times daily before a meal. 60 tablet 2   polyethylene glycol (MIRALAX) 17 g packet Take 17 g by mouth daily. 28 each 0   oxyCODONE 7.5 MG TABS Take 7.5 mg by mouth every 4 (four) hours as needed for moderate pain. (Patient not taking: Reported on 05/01/2021) 30 tablet 0   No current facility-administered medications for this visit.    VITAL SIGNS: BP (!) 209/108 (BP Location: Right Arm, Patient Position: Sitting) Comment: pt stated he didn't take his medication for the afternoon  Pulse 77   SpO2 98% Comment: 4 L nasal cannula There were no vitals filed for this visit.  Estimated body mass index is 26.61 kg/m as calculated from the following:   Height as  of an earlier encounter on 05/01/21: 5\' 8"  (1.727 m).   Weight as of an earlier encounter on 05/01/21: 175 lb (79.4 kg).  LABS: CBC:    Component Value Date/Time   WBC 5.9 04/13/2021 0440   HGB 9.1 (L) 04/13/2021 0440   HGB 12.2 (L) 11/07/2016 0922   HCT 30.7 (L) 04/13/2021 0440   HCT 35.9 (L) 11/07/2016 0922   PLT 189 04/13/2021 0440   PLT 233 11/07/2016 0922   MCV 97.8 04/13/2021 0440   MCV 94 11/07/2016 0922   NEUTROABS 4.8 04/02/2021 1428   NEUTROABS 3.3 11/07/2016 0922   LYMPHSABS 0.8 04/02/2021 1428   LYMPHSABS 1.7 11/07/2016 0922   MONOABS 0.5 04/02/2021 1428   EOSABS 0.1 04/02/2021 1428   EOSABS 0.2 11/07/2016 0922   BASOSABS 0.1 04/02/2021 1428   BASOSABS 0.0 11/07/2016 0922   Comprehensive Metabolic Panel:    Component Value Date/Time   NA 132 (L) 04/13/2021 0440   NA 138 05/09/2020 1120   K 5.1 04/13/2021 0440   CL 106 04/13/2021 0440   CO2 19 (L) 04/13/2021 0440   BUN 64 (H) 04/13/2021 0440   BUN 41 (H) 05/09/2020 1120   CREATININE 3.19 (H) 04/13/2021 0440   GLUCOSE 79  04/13/2021 0440   CALCIUM 8.6 (L) 04/13/2021 0440   AST 9 (L) 03/19/2021 1101   ALT 12 03/19/2021 1101   ALKPHOS 62 03/19/2021 1101   BILITOT 0.8 03/19/2021 1101   BILITOT 0.6 11/07/2016 0922   PROT 6.9 03/19/2021 1101   PROT 7.0 11/07/2016 0922   ALBUMIN 2.2 (L) 03/22/2021 0346   ALBUMIN 3.7 11/07/2016 2694    RADIOGRAPHIC STUDIES: CT CHEST WO CONTRAST  Result Date: 04/07/2021 CLINICAL DATA:  GI cancer, for staging EXAM: CT CHEST WITHOUT CONTRAST TECHNIQUE: Multidetector CT imaging of the chest was performed following the standard protocol without IV contrast. COMPARISON:  03/07/2021 FINDINGS: Cardiovascular: Mild cardiomegaly. No pericardial effusion. Hypodense blood pool relative to myocardium, suggesting anemia. No evidence of thoracic aortic aneurysm. Atherosclerotic calcifications of the aortic arch. Mild coronary atherosclerosis of the LAD and right coronary artery. Mediastinum/Nodes: No suspicious mediastinal lymphadenopathy. Visualized thyroid is unremarkable. Lungs/Pleura: Patchy opacity in the posterior right upper lobe (series 8/image 89), suggesting pneumonia. Moderate right and small left pleural effusions. Mild interlobular septal thickening in the upper lobes suggests very mild superimposed edema is possible. Mild bilateral lower lobe compressive atelectasis. 4 mm ground-glass nodule in the anterior right upper lobe (series 8/image 83), possibly related to the suspected edema, but warranting attention on follow-up. No pneumothorax. Upper Abdomen: Visualized upper abdomen is grossly unremarkable, noting vascular calcifications. Musculoskeletal: Degenerative changes of the visualized thoracolumbar spine. IMPRESSION: Right upper lobe opacity, suspicious for pneumonia. Moderate right and small left pleural effusions. Suspected mild superimposed pulmonary edema. 4 mm ground-glass nodule in the anterior right upper lobe, possibly related to the suspected edema, but warranting attention on  follow-up. Consider follow-up CT chest in 3-6 months. No findings specific for metastatic disease in the chest. Aortic Atherosclerosis (ICD10-I70.0). Electronically Signed   By: Julian Hy M.D.   On: 04/07/2021 21:12   MR PELVIS W WO CONTRAST  Result Date: 04/08/2021 CLINICAL DATA:  Rectal mass on colonoscopy, suspected rectal cancer, metastatic disease evaluation EXAM: MRI ABDOMEN AND PELVIS WITHOUT AND WITH CONTRAST TECHNIQUE: Multiplanar multisequence MR imaging of the abdomen and pelvis was performed both before and after the administration of intravenous contrast. CONTRAST:  52mL GADAVIST GADOBUTROL 1 MMOL/ML IV SOLN COMPARISON:  None. FINDINGS: Markedly limited  evaluation due to motion degradation. Lower chest: Moderate right and small left pleural effusions. Hepatobiliary: No focal hepatic lesion is seen, noting motion degradation. Gallbladder is grossly unremarkable. No intrahepatic or extrahepatic ductal dilatation. Pancreas:  Grossly unremarkable. Spleen:  Grossly unremarkable. Adrenals/Urinary Tract:  Adrenal glands are grossly unremarkable. Subcentimeter bilateral renal cysts.  No hydronephrosis. Trabeculated bladder. Stomach/Bowel: Stomach is grossly unremarkable. No evidence of bowel obstruction. Enhancing masslike concentric thickening involving the lower rectum (series 29/image 30), corresponding to the recently biopsied rectal Mass. Vascular/Lymphatic:  No evidence of aneurysm. Marked limited evaluation for lymph nodes given motion degradation. Reproductive: Prostate is grossly unremarkable. Other:  No abdominopelvic ascites. Musculoskeletal: No focal osseous lesions. IMPRESSION: Markedly limited evaluation due to motion degradation. Enhancing, masslike concentric wall thickening involving the lower rectum, corresponding to recently biopsied rectal mass. No definite findings of metastatic disease, noting significant limitations. Assuming this patient has rectal adenocarcinoma, for more  accurate staging, consider follow-up CT abdomen/pelvis with contrast in 6 weeks. Electronically Signed   By: Julian Hy M.D.   On: 04/08/2021 20:10   MR ABDOMEN W WO CONTRAST  Result Date: 04/08/2021 CLINICAL DATA:  Rectal mass on colonoscopy, suspected rectal cancer, metastatic disease evaluation EXAM: MRI ABDOMEN AND PELVIS WITHOUT AND WITH CONTRAST TECHNIQUE: Multiplanar multisequence MR imaging of the abdomen and pelvis was performed both before and after the administration of intravenous contrast. CONTRAST:  49mL GADAVIST GADOBUTROL 1 MMOL/ML IV SOLN COMPARISON:  None. FINDINGS: Markedly limited evaluation due to motion degradation. Lower chest: Moderate right and small left pleural effusions. Hepatobiliary: No focal hepatic lesion is seen, noting motion degradation. Gallbladder is grossly unremarkable. No intrahepatic or extrahepatic ductal dilatation. Pancreas:  Grossly unremarkable. Spleen:  Grossly unremarkable. Adrenals/Urinary Tract:  Adrenal glands are grossly unremarkable. Subcentimeter bilateral renal cysts.  No hydronephrosis. Trabeculated bladder. Stomach/Bowel: Stomach is grossly unremarkable. No evidence of bowel obstruction. Enhancing masslike concentric thickening involving the lower rectum (series 29/image 30), corresponding to the recently biopsied rectal mass. Vascular/Lymphatic:  No evidence of aneurysm. Markedly limited evaluation for lymph nodes given motion degradation. Reproductive: Prostate is grossly unremarkable. Other:  No abdominopelvic ascites. Musculoskeletal: No focal osseous lesions. IMPRESSION: Markedly limited evaluation due to motion degradation. Enhancing, masslike concentric wall thickening involving the lower rectum, corresponding to recently biopsied rectal mass. No definite findings of metastatic disease, noting significant limitations. Assuming this patient has rectal adenocarcinoma, for more accurate staging, consider follow-up CT abdomen/pelvis with contrast  in 6 weeks. Electronically Signed   By: Julian Hy M.D.   On: 04/08/2021 20:03    PERFORMANCE STATUS (ECOG) : 2 - Symptomatic, <50% confined to bed  Review of Systems  Constitutional:  Positive for appetite change and fatigue.  Cardiovascular:  Positive for chest pain and leg swelling.  Gastrointestinal:  Positive for diarrhea.  Neurological:  Positive for weakness.  Unless otherwise noted, a complete review of systems is negative.  Physical Exam General: Weak  Cardiovascular: regular rate and rhythm, hypertensive Pulmonary: clear ant fields, 4L/[place]  Abdomen: soft, tender, + bowel sounds Extremities: 2+ bilateral lower extremity pitting edema  Skin: no rashes, dry  Neurological: Weakness but otherwise nonfocal, AAO x3  IMPRESSION:  Mr. Eskew presents to the clinic today for follow-up symptom management and goals of care.  He has missed the past several appointments (radiation and palliative) due to generalized weakness and diarrhea.  He is scheduled to receive his last radiation treatment on today which she is appreciative of.  Patient appears weak and frail.  He is  leaning over to 1 side of the wheelchair expressing he has very little energy.  States he "passed out" in his parking lot at home prior to coming in for visit.  He does not think he lost consciousness but states he was really weak.  His neighbor saw him and was able to come over and assist.  Denies hitting his head or any injuries.  He is complaining of intermittent chest pain and took a nitroglycerin on last night and this morning.  He is hypertensive here in the clinic (210/108).  He states he is taking all medications as prescribed without complications.  Significant bilateral lower extremity edema.  Patient also has complained of diarrhea x4 days and minimal appetite.  Unfortunately patient lives at home alone and I am unsure how well he is able to care for himself due to his significant weakness.  I expressed my  concerns given patient's reported symptoms and need for further evaluation in the emergency department.  Patient is tearful expressing he did not want to go to the hospital.  Emphasized for continued work-up specifically cardiac related with normal recent hospitalization nitroglycerin drip.  I was able to convince patient to allow for further work-up with understanding there is a high likelihood he would be admitted.  I offered to contact his family however he declined stating he would reach out to his brother once again tomorrow.  I spoke with Marzetta Board, RN [facility] ED Charge) and provided overview of what is going on with patient. Advised to bring over to ED and place in room 15.   Patient escorted to [facility] ED via wheelchair by Jarrett Soho, RN and myself. Report given to [facility], Therapist, sports. Patient resting securely in bed.   PLAN: Completed final radiation today.  Unfortunately due to severe symptom burden: weakness, chest pain, bilateral lower extremity pitting edema, diarrhea, and nausea patient taken to [facility] ED for admission. I will plan to follow-up for support.    Patient expressed understanding and was in agreement with this plan. He also understands that He can call the clinic at any time with any questions, concerns, or complaints.     Time Total: 40 min.   Visit consisted of counseling and education dealing with the complex and emotionally intense issues of symptom management and palliative care in the setting of serious and potentially life-threatening illness.Greater than 50%  of this time was spent counseling and coordinating care related to the above assessment and plan.  Signed by: Alda Lea, AGPCNP-BC Palliative Medicine Team

## 2021-05-01 NOTE — Assessment & Plan Note (Addendum)
-  patient presenting with symptoms of worsening shortness of breath and orthopnea - also noted to have pulmonary edema, B/L LE edema, DOE, pleural effusions -Cardiology also consulted on admission for assistance as well as in setting of indeterminate troponin - diuresis (lasix held starting 11/11 due to increase in creatinine) and blood pressure management; lasix resumed on 11/14 (renal function labile) -limited echo repeated on admission: EF 60-65%, Gr II DD - unfortunate circumstance with limited ability to diurese in setting of brittle CKD4; patient met with palliative care on 11/14 and decided to avoid HD and instead was interested on focusing on comfort driven care (okay continuing lasix and other meds if helping but did not wish to escalate care); follow up formal palliative care eval

## 2021-05-01 NOTE — Assessment & Plan Note (Addendum)
Continue home eyedrops °

## 2021-05-01 NOTE — ED Provider Notes (Signed)
[facility] DEPT Provider Note   CSN: [id number] Arrival date & time: 05/01/21  1636     History Chief Complaint  Patient presents with   Weakness   Hypertension    Robert Chaney is a 70 y.o. male with history of rectal adenocarcinoma, chronic respiratory failure, CKD stage IV, hypertension, diabetes, CHF, H. Pylori resents emergency department for evaluation of weakness, chest pain, shortness of breath for the past 2 to 3 days.  Patient was at the cancer center today and received his radiation treatment when he felt weak and slowly sat down on the ground in the parking lot.  He denies any syncope or LOC, he mentions he remembers the entire event.  Additionally, he mentions he has been having some intermittent chest pain for the past 2 days its been relieved with 2 nitro's.  Shortness of breath intermittent.  Denies any nausea, vomiting, diaphoresis, blurry vision, sore throat, cough, fevers.  He mentions he had a mild headache for the past few days.  Mention dysuria for the past 2 to 3 days.  Mentions dark and tarry stools as well.  Denies any new changes to his medications.  No known drug allergies.   Weakness Associated symptoms: chest pain, diarrhea, dysuria, headaches and shortness of breath   Associated symptoms: no abdominal pain, no arthralgias, no cough, no fever, no frequency, no nausea, no seizures, no urgency and no vomiting   Hypertension Associated symptoms include chest pain, headaches and shortness of breath. Pertinent negatives include no abdominal pain.      Past Medical History:  Diagnosis Date   Altered mental status    a. 05/2017 - adm with blurred vision, somnolence in setting of AKI and high blood sugar.   Anemia    CKD (chronic kidney disease), stage III (HCC)    Diabetes mellitus    Diastolic CHF, chronic ([place])    A.  03/2009 Echo: EF 60-65%, Gr II diast dysfxn   Elevated troponin    a. 2013 - troponin 1.4. b. 2019 - troponin  0.32; neg nuc 07/2017.   Glaucoma    Hyperlipidemia    HYPERCHOLESTEROLEMIA   Hypertension    MARKED LEFT VENTRICULAR HYPERTROPHY BY PREVIOUS ECHOCARDIOGRAM--HE HAS HYPERDYNAMIC LEFT VENTRICULAR SYSTOLIC FUNCTION AND HAS IMPAIRED RELAXATION BY ECHO   Hypertrophic cardiomyopathy (HCC)    NSVT (nonsustained ventricular tachycardia)    Premature atrial contractions    PVC's (premature ventricular contractions)    SVT (supraventricular tachycardia) ([place])     Patient Active Problem List   Diagnosis Date Noted   Mixed hyperlipidemia 05/01/2021   Glaucoma 05/01/2021   GERD without esophagitis 05/01/2021   Rectal adenocarcinoma (HCC)    Rectal bleeding    Malnutrition of moderate degree 04/03/2021   Chronic constipation    Hematochezia    Chronic posterior anal fissure    Helicobacter pylori gastritis    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[place]) 03/16/2021   Abnormal CXR 03/16/2021   Elevated brain natriuretic peptide (BNP) level 03/16/2021   Left thyroid nodule 03/16/2021   GI bleed 03/16/2021   Acute blood loss anemia 03/16/2021   Acute on chronic respiratory failure with hypoxia and hypercapnia ([place]) 02/28/2021   Melena 02/28/2021   Symptomatic anemia 02/28/2021   Syncope 01/30/2021   Chronic respiratory failure with hypoxia ([place]) 01/30/2021   Multifocal pneumonia 01/30/2021   Acute on chronic respiratory failure with hypoxia ([place]) 05/03/2020   Hypertensive crisis 02/18/2020   Type 2 diabetes mellitus with retinopathy, with long-term current use  of insulin ([place]) 07/16/2019   Type 2 diabetes mellitus with stage 4 chronic kidney disease, with long-term current use of insulin ([place]) 07/16/2019   Type 2 diabetes mellitus with diabetic polyneuropathy, with long-term current use of insulin ([place]) 07/16/2019   OSA (obstructive sleep apnea) 03/30/2019   Hyperglycemia 07/31/2017   Near syncope 07/31/2017   Acute-on-chronic kidney injury ([place]) 07/31/2017   AKI (acute kidney injury) ([place]) 06/09/2017    Hypertensive heart disease 11/07/2016   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[place]) 07/26/2015   Stage 4 chronic kidney disease ([place]) 07/19/2011   Elevated troponin level not due myocardial infarction 07/19/2011   Hyperkalemia 07/19/2011   Volume depletion 07/19/2011   Acute on chronic diastolic CHF (congestive heart failure) ([place]) 07/19/2011   Essential hypertension 07/19/2011   Hypercholesterolemia 02/20/2011   Benign hypertensive heart disease without heart failure 02/20/2011    Past Surgical History:  Procedure Laterality Date   BIOPSY  03/03/2021   Procedure: BIOPSY;  Surgeon: Jackquline Denmark, MD;  Location: [facility];  Service: Endoscopy;;   BIOPSY  04/06/2021   Procedure: BIOPSY;  Surgeon: [name], MD;  Location: [facility];  Service: Gastroenterology;;   BRONCHIAL BIOPSY  03/05/2021   Procedure: BRONCHIAL BIOPSIES;  Surgeon: Candee Furbish, MD;  Location: [facility] ENDOSCOPY;  Service: Pulmonary;;   BRONCHIAL BRUSHINGS  03/05/2021   Procedure: BRONCHIAL BRUSHINGS;  Surgeon: Candee Furbish, MD;  Location: [place];  Service: Pulmonary;;   BRONCHIAL WASHINGS  03/05/2021   Procedure: BRONCHIAL WASHINGS;  Surgeon: Candee Furbish, MD;  Location: [facility] ENDOSCOPY;  Service: Pulmonary;;   COLONOSCOPY WITH PROPOFOL N/A 04/06/2021   Procedure: COLONOSCOPY WITH PROPOFOL;  Surgeon: [name], MD;  Location: [facility] ENDOSCOPY;  Service: Gastroenterology;  Laterality: N/A;   ESOPHAGOGASTRODUODENOSCOPY (EGD) WITH PROPOFOL N/A 03/03/2021   Procedure: ESOPHAGOGASTRODUODENOSCOPY (EGD) WITH PROPOFOL;  Surgeon: Jackquline Denmark, MD;  Location[facility];  Service: Endoscopy;  Laterality: N/A;   HEMOSTASIS CONTROL  03/05/2021   Procedure: HEMOSTASIS CONTROL;  Surgeon: Candee Furbish, MD;  Location: [facility] ENDOSCOPY;  Service: Pulmonary;;   NO PAST SURGERIES     POLYPECTOMY  04/06/2021   Procedure: POLYPECTOMY;  Surgeon: [name], MD;  Location: [facility];  Service:  Gastroenterology;;   VIDEO BRONCHOSCOPY N/A 03/05/2021   Procedure: VIDEO BRONCHOSCOPY WITH FLUORO;  Surgeon: Candee Furbish, MD;  Location: [facility] ENDOSCOPY;  Service: Pulmonary;  Laterality: N/A;       Family History  Problem Relation Age of Onset   Hypertension Father    Stroke Father    Hypertension Mother    Diabetes Brother    Hypertension Brother    Diabetes Brother     Social History   Tobacco Use   Smoking status: Never   Smokeless tobacco: Never  Vaping Use   Vaping Use: Never used  Substance Use Topics   Alcohol use: No   Drug use: No    Home Medications Prior to Admission medications   Medication Sig Start Date End Date Taking? Authorizing Provider  amLODipine (NORVASC) 10 MG tablet Take 1 tablet (10 mg total) by mouth daily. 05/13/20  Yes Shawna Clamp, MD  atorvastatin (LIPITOR) 20 MG tablet Take 1 tablet (20 mg total) by mouth daily. 03/16/18  Yes Dorothy Spark, MD  brimonidine (ALPHAGAN) 0.2 % ophthalmic solution Place 1 drop into the right eye 2 (two) times daily. 10/06/19  Yes [provider]  carvedilol (COREG) 25 MG tablet Take 1 tablet (25 mg total) by mouth  2 (two) times daily. 04/14/21  Yes Little Ishikawa, MD  cholecalciferol (VITAMIN D3) 25 MCG (1000 UT) tablet Take 1,000 Units by mouth daily.   Yes [provider]  docusate sodium (COLACE) 100 MG capsule Take 2 capsules (200 mg total) by mouth 2 (two) times daily. 03/10/21  Yes Thurnell Lose, MD  dorzolamide (TRUSOPT) 2 % ophthalmic solution Place 1 drop into both eyes 2 (two) times daily. 12/04/20  Yes [provider]  furosemide (LASIX) 40 MG tablet Take 1 tablet (40 mg total) by mouth 2 (two) times daily. 03/24/21  Yes Mercy Riding, MD  hydrALAZINE (APRESOLINE) 100 MG tablet Take 1 tablet (100 mg total) by mouth 3 (three) times daily. 03/16/20  Yes Skeet Latch, MD  insulin glargine (LANTUS SOLOSTAR) 100 UNIT/ML Solostar Pen Inject 10 Units into the skin  daily. Patient taking differently: Inject 3-6 Units into the skin 3 (three) times daily. Sliding scale 03/22/21  Yes Mercy Riding, MD  isosorbide mononitrate (IMDUR) 60 MG 24 hr tablet Take 60 mg by mouth daily. 04/27/20  Yes [provider]  Continuous Blood Gluc Sensor (DEXCOM G6 SENSOR) MISC 1 Device by Does not apply route as directed. 01/12/21   Shamleffer, Melanie Crazier, MD  Continuous Blood Gluc Transmit (DEXCOM G6 TRANSMITTER) MISC 1 Device by Does not apply route as directed. 01/12/21   Shamleffer, Melanie Crazier, MD  glucose blood (ONETOUCH VERIO) test strip USE 1 STRIP TO CHECK GLUCOSE THREE TIMES DAILY AS DIRECTED 07/24/20   Shamleffer, Melanie Crazier, MD  Insulin Pen Needle 32G X 4 MM MISC Use as directed with insulin 03/22/21   Gonfa, Charlesetta Ivory, MD  latanoprost (XALATAN) 0.005 % ophthalmic solution Place 1 drop into the left eye at bedtime. 10/06/19   [provider]  nitroGLYCERIN (NITROSTAT) 0.4 MG SL tablet DISSOLVE ONE TABLET UNDER THE TONGUE EVERY 5 MINUTES AS NEEDED FOR CHEST PAIN. Patient taking differently: Place 0.4 mg under the tongue every 5 (five) minutes as needed for chest pain. 03/27/20   Dorothy Spark, MD  oxyCODONE 7.5 MG TABS Take 7.5 mg by mouth every 4 (four) hours as needed for moderate pain. Patient not taking: Reported on 05/01/2021 04/14/21   Little Ishikawa, MD  pantoprazole (PROTONIX) 40 MG tablet Take 1 tablet (40 mg total) by mouth 2 (two) times daily before a meal. 03/10/21   Thurnell Lose, MD  polyethylene glycol (MIRALAX) 17 g packet Take 17 g by mouth daily. 03/10/21   Thurnell Lose, MD    Allergies    Patient has no known allergies.  Review of Systems   Review of Systems  Constitutional:  Positive for fatigue. Negative for chills and fever.  HENT:  Negative for congestion, ear pain, rhinorrhea and sore throat.   Eyes:  Negative for pain and visual disturbance.  Respiratory:  Positive for shortness of breath. Negative  for cough.   Cardiovascular:  Positive for chest pain. Negative for palpitations.  Gastrointestinal:  Positive for blood in stool, constipation and diarrhea. Negative for abdominal pain, nausea and vomiting.  Genitourinary:  Positive for dysuria. Negative for flank pain, frequency, hematuria and urgency.  Musculoskeletal:  Negative for arthralgias and back pain.  Skin:  Negative for color change and rash.  Neurological:  Positive for weakness, light-headedness and headaches. Negative for seizures and syncope.  All other systems reviewed and are negative.  Physical Exam Updated Vital Signs BP (!) 197/87   Pulse 69   Temp 99.9  F (37.7 C) (Rectal)   Resp (!) 28   Ht 5\' 8"  (1.727 m)   Wt 79.4 kg   SpO2 90%   BMI 26.61 kg/m   Physical Exam Vitals and nursing note reviewed.  Constitutional:      General: He is not in acute distress.    Appearance: Normal appearance. He is not toxic-appearing.  HENT:     Head: Normocephalic and atraumatic.     Mouth/Throat:     Mouth: Mucous membranes are moist.  Eyes:     General: No scleral icterus. Cardiovascular:     Rate and Rhythm: Normal rate and regular rhythm.  Pulmonary:     Effort: Pulmonary effort is normal. No respiratory distress.     Comments: Diminished breath sounds.  No respiratory distress, he has a muscle use, tripoding, nasal flaring, cyanosis present.  Patient speaking full sentences with ease.  Patient satting 95% on at home 4 L nasal cannula. Abdominal:     General: Abdomen is flat. Bowel sounds are normal.     Palpations: Abdomen is soft.     Tenderness: There is no abdominal tenderness. There is no guarding or rebound.  Musculoskeletal:        General: No deformity.     Cervical back: Normal range of motion.     Right lower leg: Edema present.     Left lower leg: Edema present.     Comments: 2+ bilateral pitting edema to lower extremities.  Skin:    General: Skin is warm and dry.  Neurological:     General: No  focal deficit present.     Mental Status: He is alert. Mental status is at baseline.     Cranial Nerves: No cranial nerve deficit.     Sensory: No sensory deficit.     Motor: No weakness.     Comments: Grip strength equal bilaterally.  Equal strength in bilateral upper and lower extremities.    ED Results / Procedures / Treatments   Labs (all labs ordered are listed, but only abnormal results are displayed) Labs Reviewed  CBC WITH DIFFERENTIAL/PLATELET - Abnormal; Notable for the following components:      Result Value   RBC 3.70 (*)    Hemoglobin 10.9 (*)    HCT 36.7 (*)    MCHC 29.7 (*)    RDW 18.3 (*)    All other components within normal limits  COMPREHENSIVE METABOLIC PANEL - Abnormal; Notable for the following components:   Glucose, Bld 128 (*)    BUN 41 (*)    Creatinine, Ser 2.41 (*)    Total Protein 8.2 (*)    Albumin 3.4 (*)    GFR, Estimated 28 (*)    All other components within normal limits  URINALYSIS, ROUTINE W REFLEX MICROSCOPIC - Abnormal; Notable for the following components:   Color, Urine STRAW (*)    Hgb urine dipstick SMALL (*)    Protein, ur >=300 (*)    All other components within normal limits  BRAIN NATRIURETIC PEPTIDE - Abnormal; Notable for the following components:   B Natriuretic Peptide 3,367.1 (*)    All other components within normal limits  POC OCCULT BLOOD, ED - Abnormal; Notable for the following components:   Fecal Occult Bld POSITIVE (*)    All other components within normal limits  CBG MONITORING, ED - Abnormal; Notable for the following components:   Glucose-Capillary 114 (*)    All other components within normal limits  I-STAT CHEM 8,  ED - Abnormal; Notable for the following components:   BUN 44 (*)    Creatinine, Ser 2.60 (*)    Glucose, Bld 124 (*)    Hemoglobin 12.6 (*)    HCT 37.0 (*)    All other components within normal limits  TROPONIN I (HIGH SENSITIVITY) - Abnormal; Notable for the following components:   Troponin I  (High Sensitivity) 113 (*)    All other components within normal limits  TROPONIN I (HIGH SENSITIVITY) - Abnormal; Notable for the following components:   Troponin I (High Sensitivity) 114 (*)    All other components within normal limits  RESP PANEL BY RT-PCR (FLU A&B, COVID) ARPGX2  CULTURE, BLOOD (ROUTINE X 2)  CULTURE, BLOOD (ROUTINE X 2)  LACTIC ACID, PLASMA  PROCALCITONIN  C-REACTIVE PROTEIN  COMPREHENSIVE METABOLIC PANEL  CBC WITH DIFFERENTIAL/PLATELET  MAGNESIUM  CBG MONITORING, ED    EKG EKG Interpretation  Date/Time:  Tuesday May 01 2021 21:05:16 EST Ventricular Rate:  66 PR Interval:  152 QRS Duration: 85 QT Interval:  382 QTC Calculation: 401 R Axis:   86 Text Interpretation: Incomplete analysis due to missing data in precordial lead(s) Sinus rhythm Borderline right axis deviation Consider left ventricular hypertrophy Borderline T abnormalities, inferior leads Missing lead(s): V2 When compared with ECG of 04/08/2021, No significant change was found Confirmed by Delora Fuel ([id number]) on 05/02/2021 12:08:37 AM  Radiology DG Chest 2 View  Result Date: 05/01/2021 CLINICAL DATA:  Chest pain EXAM: CHEST - 2 VIEW COMPARISON:  Chest radiograph dated 04/01/2021. FINDINGS: Cardiomegaly with vascular congestion and mild edema. Right upper lobe opacity consistent with pneumonia. Underlying mass is not excluded clinical correlation and follow-up to resolution recommended. Small bilateral pleural effusions suspected. No pneumothorax. Atherosclerotic calcification of the aorta. Degenerative changes of the spine. No acute osseous pathology. IMPRESSION: 1. Cardiomegaly with findings of CHF. 2. Right upper lobe pneumonia. Electronically Signed   By: Anner Crete M.D.   On: 05/01/2021 19:36   CT Head Wo Contrast  Result Date: 05/01/2021 CLINICAL DATA:  Altered mental status EXAM: CT HEAD WITHOUT CONTRAST TECHNIQUE: Contiguous axial images were obtained from the base of the skull  through the vertex without intravenous contrast. COMPARISON:  CT brain 01/30/2021 FINDINGS: Brain: No acute territorial infarction, hemorrhage or intracranial mass. 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 Mild atrophy. Moderate chronic small vessel ischemic changes of the white matter. Stable ventricle size. Vascular: No hyperdense vessels.  Carotid vascular calcification Skull: Normal. Negative for fracture or focal lesion. Sinuses/Orbits: Mucosal thickening in the sinuses Other: None IMPRESSION: 1. No CT evidence for acute intracranial abnormality. 2. Atrophy and chronic small vessel ischemic changes of the white matter. Multiple chronic cerebellar infarcts. Electronically Signed   By: Donavan Foil M.D.   On: 05/01/2021 19:38    Procedures Procedures   Medications Ordered in ED Medications  amLODipine (NORVASC) tablet 10 mg (has no administration in time range)  atorvastatin (LIPITOR) tablet 20 mg (has no administration in time range)  brimonidine (ALPHAGAN) 0.2 % ophthalmic solution 1 drop (has no administration in time range)  carvedilol (COREG) tablet 25 mg (25 mg Oral Given 05/01/21 2205)  docusate sodium (COLACE) capsule 200 mg (has no administration in time range)  dorzolamide (TRUSOPT) 2 % ophthalmic solution 1 drop (has no administration in time range)  furosemide (LASIX) injection 40 mg (has no administration in time range)  hydrALAZINE (APRESOLINE) tablet 100 mg (has no administration in time range)  isosorbide mononitrate (IMDUR) 24 hr tablet 60  mg (has no administration in time range)  latanoprost (XALATAN) 0.005 % ophthalmic solution 1 drop (has no administration in time range)  nitroGLYCERIN (NITROSTAT) SL tablet 0.4 mg (has no administration in time range)  pantoprazole (PROTONIX) EC tablet 40 mg (has no administration in time range)  insulin glargine-yfgn (SEMGLEE) injection 10 Units (has no administration in time range)  enalaprilat (VASOTEC) injection 0.625 mg (has no  administration in time range)  doxazosin (CARDURA) tablet 2 mg (has no administration in time range)  insulin aspart (novoLOG) injection 0-15 Units (has no administration in time range)  enoxaparin (LOVENOX) injection 30 mg (has no administration in time range)  acetaminophen (TYLENOL) tablet 650 mg (has no administration in time range)    Or  acetaminophen (TYLENOL) suppository 650 mg (has no administration in time range)  polyethylene glycol (MIRALAX / GLYCOLAX) packet 17 g (has no administration in time range)  ondansetron (ZOFRAN) tablet 4 mg (has no administration in time range)    Or  ondansetron (ZOFRAN) injection 4 mg (has no administration in time range)  labetalol (NORMODYNE) injection 20 mg (20 mg Intravenous Given 05/01/21 2052)  hydrALAZINE (APRESOLINE) tablet 100 mg (100 mg Oral Given 05/01/21 2205)  furosemide (LASIX) injection 40 mg (40 mg Intravenous Given 05/01/21 2054)  nitroGLYCERIN (NITROGLYN) 2 % ointment 1 inch (1 inch Topical Given 05/01/21 2055)    ED Course  I have reviewed the triage vital signs and the nursing notes.  Pertinent labs & imaging results that were available during my care of the patient were reviewed by me and considered in my medical decision making (see chart for details).  70 year old medically complex male presents the emergency department after weakness, chest pain, shortness of breath worsening for the past 2 to 3 days.  Patient was recently here and discharged 04-14-2021 from hypertension urgency and newly diagnosed rectal carcinoma.  Differential diagnosis includes was not limited to electrolyte abnormality, vasovagal, ACS, CHF exacerbation, stroke, TIA, dehydration, anemia, GI bleed, PE, pneumothorax radiation side effects.  I personally reviewed the patient's labs and imaging.  Patient CMP shows mildly elevated glucose at 128.  Elevated creatinine 2.41, patient's previous creatinine was 3.19 two weeks ago.  CBC does not show any leukocytosis.   Mild anemia 10.9/36.7 - this is higher than patient's previous CBC at 9.1 and 30.7.  Hemoccult positive.  On exam multiple external hemorrhoids visualized.  Urinalysis shows straw-colored urine with small amounts of blood and greater than 300 proteins.  No ketones, nitrates, leukocytes present.  Troponin elevated at 113 repeat at 114.  Similar troponins were seen 1 month ago to this.  BNP elevated at 3,367.1.  Previous BNP around 1 month ago was around 1500.  COVID and flu negative.  CT head shows no CT evidence for acute intracranial abnormality. Atrophy and chronic small vessel ischemic changes of the white matter. Multiple chronic cerebellar infarcts. CXR shows cardiomegaly with findings of CHF. Right upper lobe pneumonia. EKG shows no significant change since previous tracing.   The patient was given 40 mg of Lasix, on a milligrams of hydralazine, 20 mg of labetalol, and nitro glycerin ointment, without significant change in blood pressure.  Additionally, patient sustained mild high normal temp of 99.9.  Patient not in any acute respiratory distress.  Sleeping comfortably with his at home nasal cannula on 4 L.  The patient reports they are compliant with their at home medications.  Reports compliant with his Lasix.  Due to patient's labs and imaging along with physical exam,  will admit to medicine team for hypertensive urgency, CHF exacerbation, heme positive stool.  Dr. Cyd Silence admitting to [facility] for further blood pressure control and fluid management.   I discussed this case with my attending physician who cosigned this note including patient's presenting symptoms, physical exam, and planned diagnostics and interventions. Attending physician stated agreement with plan or made changes to plan which were implemented.   Attending physician assessed patient at bedside.    MDM Rules/Calculators/A&P                          Final Clinical Impression(s) / ED Diagnoses Final diagnoses:  Hypertensive  urgency  Heme positive stool  Anemia, unspecified type  Acute on chronic congestive heart failure, unspecified heart failure type [facility])  Community acquired pneumonia of right lung, unspecified part of lung    Rx / DC Orders ED Discharge Orders     None        Sherrell Puller, PA-C 05/02/21 0018    Lajean Saver, MD 05/07/21 [phone]

## 2021-05-01 NOTE — Assessment & Plan Note (Addendum)
-   Recent diagnosis of rectal adenocarcinoma in 03/2021 - Patient has recently been established with Dr. [name] - Receiving radiation therapy in the outpatient setting -Cetacaine spray working well, patient requesting this at discharge.  Also continue as needed oxycodone - does not appear to be a candidate for chemo or surgery due to cardiopulm status; decision of patient to pursue HD would not change cancer management per oncology

## 2021-05-01 NOTE — Assessment & Plan Note (Addendum)
-   Hemoglobin A1C performed in October was 5.9% - continue diabetic diet

## 2021-05-01 NOTE — Assessment & Plan Note (Addendum)
continue statin

## 2021-05-01 NOTE — Assessment & Plan Note (Addendum)
-   Patient presenting with elevated troponin of 113. Likely enzyme leak in setting of hypertensive crisis - denies CP but did have DOE and chest pressure with deep inspiration - no large uptrend with trending - no plans for ischemic evaluation especially given patient's decision to avoid further escalation

## 2021-05-01 NOTE — H&P (Addendum)
History and Physical    Robert Chaney ZTI:[id number] DOB: [birth year] DOA: 05/01/2021  PCP: [name], MD  Patient coming from: cancer center   Chief Complaint:  Chief Complaint  Patient presents with   Weakness   Hypertension     HPI:    70 year old male with past medical history of recent diagnosis of rectal adenocarcinoma (colonoscopy 03/2021), chronic respiratory failure (on 4lpm via [facility]), insulin-dependent diabetes mellitus type 2 (hgba1c 03/2021 5.9%), protein calorie malnutrition, hyperlipidemia, hypertension, chronic kidney disease stage IV (baseline Cr approx 2.8), gastroesophageal reflux disease, diastolic congestive heart failure (Echo 01/2021 EF 70-75% with G1DD), HCM without LVOT obstruction, obstructive sleep apnea on CPAP, glaucoma who presents to [facility] emergency department from the cancer center with complaints of weakness shortness of breath and nausea.   Patient explains that for the past 2 to 3 days he has been experiencing progressively worsening shortness of breath and weakness.  Patient states that the shortness of breath is moderate to severe in intensity, worse with minimal exertion and improved with rest.  Patient endorses associated orthopnea and paroxysmal nocturnal dyspnea.  Patient symptoms have progressively worsened over the past several days and patient has begun to experience intermittent episodes of mild to moderate midsternal chest pressure.  Episodes of chest pressure occur mainly with exertion and seem to be relieved with nitroglycerin.  Patient denies associated cough, fever, sick contacts, recent travel or contact with confirmed COVID-19 infection.  Is associated with generalized weakness.  Patient presented to the palliative care clinic on 11/8 and due to profound symptoms of weakness on arrival the patient was sent to [facility] emergency department for evaluation.  Upon evaluation in the emergency department patient was found  to have markedly elevated blood pressures as high as 232/141.  Patient was additionally found to have an elevated troponin of 113 and markedly elevated BNP of 3367.  Chest x-ray revealed evidence of cardiogenic pulmonary edema with concerns for a right upper lobe pneumonia.  Patient was given intravenous Lasix as well as a dose of intravenous labetalol and oral hydralazine.  The hospitalist group was then called to assess the patient for admission to the hospital.   Review of Systems:   Review of Systems  Respiratory:  Positive for shortness of breath.   Neurological:  Positive for dizziness and weakness.  All other systems reviewed and are negative.  Past Medical History:  Diagnosis Date   Altered mental status    a. 05/2017 - adm with blurred vision, somnolence in setting of AKI and high blood sugar.   Anemia    CKD (chronic kidney disease), stage III (HCC)    Diabetes mellitus    Diastolic CHF, chronic ([place])    A.  03/2009 Echo: EF 60-65%, Gr II diast dysfxn   Elevated troponin    a. 2013 - troponin 1.4. b. 2019 - troponin 0.32; neg nuc 07/2017.   Glaucoma    Hyperlipidemia    HYPERCHOLESTEROLEMIA   Hypertension    MARKED LEFT VENTRICULAR HYPERTROPHY BY PREVIOUS ECHOCARDIOGRAM--HE HAS HYPERDYNAMIC LEFT VENTRICULAR SYSTOLIC FUNCTION AND HAS IMPAIRED RELAXATION BY ECHO   Hypertrophic cardiomyopathy (HCC)    NSVT (nonsustained ventricular tachycardia)    Premature atrial contractions    PVC's (premature ventricular contractions)    SVT (supraventricular tachycardia) ([place])     Past Surgical History:  Procedure Laterality Date   BIOPSY  03/03/2021   Procedure: BIOPSY;  Surgeon: Jackquline Denmark, MD;  Location: [facility] ENDOSCOPY;  Service: Endoscopy;;  BIOPSY  04/06/2021   Procedure: BIOPSY;  Surgeon: [name], MD;  Location: [place];  Service: Gastroenterology;;   BRONCHIAL BIOPSY  03/05/2021   Procedure: BRONCHIAL BIOPSIES;  Surgeon: Candee Furbish, MD;  Location: [place];  Service: Pulmonary;;   BRONCHIAL BRUSHINGS  03/05/2021   Procedure: BRONCHIAL BRUSHINGS;  Surgeon: Candee Furbish, MD;  Location: [place];  Service: Pulmonary;;   BRONCHIAL WASHINGS  03/05/2021   Procedure: BRONCHIAL WASHINGS;  Surgeon: Candee Furbish, MD;  Location: [place];  Service: Pulmonary;;   COLONOSCOPY WITH PROPOFOL N/A 04/06/2021   Procedure: COLONOSCOPY WITH PROPOFOL;  Surgeon: [name], MD;  Location: [place];  Service: Gastroenterology;  Laterality: N/A;   ESOPHAGOGASTRODUODENOSCOPY (EGD) WITH PROPOFOL N/A 03/03/2021   Procedure: ESOPHAGOGASTRODUODENOSCOPY (EGD) WITH PROPOFOL;  Surgeon: Jackquline Denmark, MD;  Location: [facility] ENDOSCOPY;  Service: Endoscopy;  Laterality: N/A;   HEMOSTASIS CONTROL  03/05/2021   Procedure: HEMOSTASIS CONTROL;  Surgeon: Candee Furbish, MD;  Location: [facility] ENDOSCOPY;  Service: Pulmonary;;   NO PAST SURGERIES     POLYPECTOMY  04/06/2021   Procedure: POLYPECTOMY;  Surgeon: [name], MD;  Location: [place];  Service: Gastroenterology;;   VIDEO BRONCHOSCOPY N/A 03/05/2021   Procedure: VIDEO BRONCHOSCOPY WITH FLUORO;  Surgeon: Candee Furbish, MD;  Location: [facility] ENDOSCOPY;  Service: Pulmonary;  Laterality: N/A;     reports that he has never smoked. He has never used smokeless tobacco. He reports that he does not drink alcohol and does not use drugs.  No Known Allergies  Family History  Problem Relation Age of Onset   Hypertension Father    Stroke Father    Hypertension Mother    Diabetes Brother    Hypertension Brother    Diabetes Brother      Prior to Admission medications   Medication Sig Start Date End Date Taking? Authorizing Provider  amLODipine (NORVASC) 10 MG tablet Take 1 tablet (10 mg total) by mouth daily. 05/13/20  Yes Shawna Clamp, MD  atorvastatin (LIPITOR) 20 MG tablet Take 1 tablet (20 mg total) by mouth daily. 03/16/18  Yes Dorothy Spark, MD  brimonidine (ALPHAGAN) 0.2 % ophthalmic solution  Place 1 drop into the right eye 2 (two) times daily. 10/06/19  Yes [provider]  carvedilol (COREG) 25 MG tablet Take 1 tablet (25 mg total) by mouth 2 (two) times daily. 04/14/21  Yes Little Ishikawa, MD  cholecalciferol (VITAMIN D3) 25 MCG (1000 UT) tablet Take 1,000 Units by mouth daily.   Yes [provider]  docusate sodium (COLACE) 100 MG capsule Take 2 capsules (200 mg total) by mouth 2 (two) times daily. 03/10/21  Yes Thurnell Lose, MD  dorzolamide (TRUSOPT) 2 % ophthalmic solution Place 1 drop into both eyes 2 (two) times daily. 12/04/20  Yes [provider]  furosemide (LASIX) 40 MG tablet Take 1 tablet (40 mg total) by mouth 2 (two) times daily. 03/24/21  Yes Mercy Riding, MD  hydrALAZINE (APRESOLINE) 100 MG tablet Take 1 tablet (100 mg total) by mouth 3 (three) times daily. 03/16/20  Yes Skeet Latch, MD  insulin glargine (LANTUS SOLOSTAR) 100 UNIT/ML Solostar Pen Inject 10 Units into the skin daily. Patient taking differently: Inject 3-6 Units into the skin 3 (three) times daily. Sliding scale 03/22/21  Yes Mercy Riding, MD  isosorbide mononitrate (IMDUR) 60 MG 24 hr tablet Take 60 mg by mouth daily. 04/27/20  Yes [provider]  Continuous Blood Gluc  Sensor (DEXCOM G6 SENSOR) MISC 1 Device by Does not apply route as directed. 01/12/21   Shamleffer, Melanie Crazier, MD  Continuous Blood Gluc Transmit (DEXCOM G6 TRANSMITTER) MISC 1 Device by Does not apply route as directed. 01/12/21   Shamleffer, Melanie Crazier, MD  glucose blood (ONETOUCH VERIO) test strip USE 1 STRIP TO CHECK GLUCOSE THREE TIMES DAILY AS DIRECTED 07/24/20   Shamleffer, Melanie Crazier, MD  Insulin Pen Needle 32G X 4 MM MISC Use as directed with insulin 03/22/21   Gonfa, Charlesetta Ivory, MD  latanoprost (XALATAN) 0.005 % ophthalmic solution Place 1 drop into the left eye at bedtime. 10/06/19   [provider]  nitroGLYCERIN (NITROSTAT) 0.4 MG SL tablet DISSOLVE ONE TABLET  UNDER THE TONGUE EVERY 5 MINUTES AS NEEDED FOR CHEST PAIN. Patient taking differently: Place 0.4 mg under the tongue every 5 (five) minutes as needed for chest pain. 03/27/20   Dorothy Spark, MD  oxyCODONE 7.5 MG TABS Take 7.5 mg by mouth every 4 (four) hours as needed for moderate pain. Patient not taking: Reported on 05/01/2021 04/14/21   Little Ishikawa, MD  pantoprazole (PROTONIX) 40 MG tablet Take 1 tablet (40 mg total) by mouth 2 (two) times daily before a meal. 03/10/21   Thurnell Lose, MD  polyethylene glycol (MIRALAX) 17 g packet Take 17 g by mouth daily. 03/10/21   Thurnell Lose, MD    Physical Exam: Vitals:   05/02/21 0130 05/02/21 0247 05/02/21 0300 05/02/21 0330  BP: (!) 199/91  (!) 180/85 (!) 164/79  Pulse:  71 63 65  Resp: (!) 22 20 20 18   Temp:      TempSrc:      SpO2: 100% 94% 95% 92%  Weight:      Height:        Constitutional: Awake alert and oriented x3, no associated distress.   Skin: no rashes, no lesions, good skin turgor noted. Eyes: Pupils are equally reactive to light.  No evidence of scleral icterus or conjunctival pallor.  ENMT: Moist mucous membranes noted.  Posterior pharynx clear of any exudate or lesions.   Neck: normal, supple, no masses, no thyromegaly.  Some elevation of JVP. Respiratory: Rales in the bilateral mid and lower fields.  Notable expiratory wheezing .  Increased respiratory effort without accessory muscle use.  Cardiovascular: Regular rate and rhythm, no murmurs / rubs / gallops.  Lower extremity pitting edema noted from the feet to the knees.  2+ pedal pulses. No carotid bruits.  Chest:   Nontender without crepitus or deformity.   Back:   Nontender without crepitus or deformity. Abdomen: Abdomen is soft and nontender.  No evidence of intra-abdominal masses.  Positive bowel sounds noted in all quadrants.   Musculoskeletal: No joint deformity upper and lower extremities. Good ROM, no contractures. Normal muscle tone.   Neurologic: CN 2-12 grossly intact. Sensation intact.  Patient moving all 4 extremities spontaneously.  Patient is following all commands.  Patient is responsive to verbal stimuli.   Psychiatric: Patient exhibits normal mood with appropriate affect.  Patient seems to possess insight as to their current situation.     Labs on Admission: I have personally reviewed following labs and imaging studies -   CBC: Recent Labs  Lab 05/01/21 1713 05/01/21 1832 05/02/21 0208  WBC 4.7  --  4.1  NEUTROABS 3.4  --  2.9  HGB 10.9* 12.6* 9.4*  HCT 36.7* 37.0* 30.7*  MCV 99.2  --  99.0  PLT 209  --  347   Basic Metabolic Panel: Recent Labs  Lab 05/01/21 1713 05/01/21 1832 05/01/21 2359 05/02/21 0208  NA 141 144 140  --   K 4.9 5.0 4.6  --   CL 108 109 110  --   CO2 28  --  26  --   GLUCOSE 128* 124* 106*  --   BUN 41* 44* 40*  --   CREATININE 2.41* 2.60* 2.40*  --   CALCIUM 9.4  --  8.8*  --   MG  --   --   --  2.0   GFR: Estimated Creatinine Clearance: 26.9 mL/min (A) (by C-G formula based on SCr of 2.4 mg/dL (H)). Liver Function Tests: Recent Labs  Lab 05/01/21 1713 05/01/21 2359  AST 21 18  ALT 26 21  ALKPHOS 106 86  BILITOT 0.8 0.7  PROT 8.2* 6.9  ALBUMIN 3.4* 2.7*   No results for input(s): LIPASE, AMYLASE in the last 168 hours. No results for input(s): AMMONIA in the last 168 hours. Coagulation Profile: No results for input(s): INR, PROTIME in the last 168 hours. Cardiac Enzymes: No results for input(s): CKTOTAL, CKMB, CKMBINDEX, TROPONINI in the last 168 hours. BNP (last 3 results) No results for input(s): PROBNP in the last 8760 hours. HbA1C: No results for input(s): HGBA1C in the last 72 hours. CBG: Recent Labs  Lab 05/01/21 1746  GLUCAP 114*   Lipid Profile: No results for input(s): CHOL, HDL, LDLCALC, TRIG, CHOLHDL, LDLDIRECT in the last 72 hours. Thyroid Function Tests: No results for input(s): TSH, T4TOTAL, FREET4, T3FREE, THYROIDAB in the last 72  hours. Anemia Panel: No results for input(s): VITAMINB12, FOLATE, FERRITIN, TIBC, IRON, RETICCTPCT in the last 72 hours. Urine analysis:    Component Value Date/Time   COLORURINE STRAW (A) 05/01/2021 2019   APPEARANCEUR CLEAR 05/01/2021 2019   LABSPEC 1.011 05/01/2021 2019   PHURINE 7.0 05/01/2021 2019   GLUCOSEU NEGATIVE 05/01/2021 2019   HGBUR SMALL (A) 05/01/2021 2019   BILIRUBINUR NEGATIVE 05/01/2021 2019   KETONESUR NEGATIVE 05/01/2021 2019   PROTEINUR >=300 (A) 05/01/2021 2019   UROBILINOGEN 1.0 07/19/2011 1335   NITRITE NEGATIVE 05/01/2021 2019   LEUKOCYTESUR NEGATIVE 05/01/2021 2019    Radiological Exams on Admission - Personally Reviewed: DG Chest 2 View  Result Date: 05/01/2021 CLINICAL DATA:  Chest pain EXAM: CHEST - 2 VIEW COMPARISON:  Chest radiograph dated 04/01/2021. FINDINGS: Cardiomegaly with vascular congestion and mild edema. Right upper lobe opacity consistent with pneumonia. Underlying mass is not excluded clinical correlation and follow-up to resolution recommended. Small bilateral pleural effusions suspected. No pneumothorax. Atherosclerotic calcification of the aorta. Degenerative changes of the spine. No acute osseous pathology. IMPRESSION: 1. Cardiomegaly with findings of CHF. 2. Right upper lobe pneumonia. Electronically Signed   By: Anner Crete M.D.   On: 05/01/2021 19:36   CT Head Wo Contrast  Result Date: 05/01/2021 CLINICAL DATA:  Altered mental status EXAM: CT HEAD WITHOUT CONTRAST TECHNIQUE: Contiguous axial images were obtained from the base of the skull through the vertex without intravenous contrast. COMPARISON:  CT brain 01/30/2021 FINDINGS: Brain: No acute territorial infarction, hemorrhage or intracranial mass. 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 Mild atrophy. Moderate chronic small vessel ischemic changes of the white matter. Stable ventricle size. Vascular: No hyperdense vessels.  Carotid vascular calcification Skull: Normal.  Negative for fracture or focal lesion. Sinuses/Orbits: Mucosal thickening in the sinuses Other: None IMPRESSION: 1. No CT evidence for acute intracranial abnormality. 2. Atrophy and chronic small vessel ischemic changes  of the white matter. Multiple chronic cerebellar infarcts. Electronically Signed   By: Donavan Foil M.D.   On: 05/01/2021 19:38   CT CHEST WO CONTRAST  Result Date: 05/02/2021 CLINICAL DATA:  Abnormal radiograph. EXAM: CT CHEST WITHOUT CONTRAST TECHNIQUE: Multidetector CT imaging of the chest was performed following the standard protocol without IV contrast. COMPARISON:  Chest radiograph dated 05/01/2021. FINDINGS: Evaluation of this exam is limited in the absence of intravenous contrast. Cardiovascular: There is cardiomegaly. No pericardial effusion. There is coronary vascular calcification. Moderate atherosclerotic calcification of the thoracic aorta. No aneurysmal dilatation. There is mild dilatation of the main pulmonary trunk suggestive of pulmonary hypertension. Mediastinum/Nodes: Evaluation of hilar lymph nodes is limited due to consolidative changes of the lungs and in the absence of intravenous contrast. The esophagus is grossly unremarkable. No mediastinal fluid collection. Lungs/Pleura: Moderate right and small left pleural effusions with associated partial compressive atelectasis of the lower lobes. There is diffuse interstitial and interlobular septal prominence consistent with edema. There is a focal area of airspace opacity in the right upper lobe which may represent edema but concerning for pneumonia. There is no pneumothorax. The central airways are patent. Upper Abdomen: Mild fullness of the left renal collecting system. Musculoskeletal: Degenerative changes of the spine. No acute osseous pathology. IMPRESSION: 1. Cardiomegaly with findings of CHF and bilateral pleural effusions. 2. Focal area of airspace opacity in the right upper lobe may represent edema but concerning for  pneumonia. 3. Aortic Atherosclerosis (ICD10-I70.0). Electronically Signed   By: Anner Crete M.D.   On: 05/02/2021 00:23    EKG: Personally reviewed.  Rhythm is normal sinus rhythm with heart rate of 66 bpm.  No dynamic ST segment changes appreciated.  Assessment/Plan  * Hypertensive crisis Patient presenting with marked markedly elevated blood pressures as high as 232/141 Presence of acute cardiogenic pulmonary edema and elevated troponin with chest pain are concerning for acute hypertensive emergency/crisis Patient has already been administered nitroglycerin paste as well as intravenous labetalol in the emergency department. Resuming home regimen of antihypertensive therapy including Coreg 25 mg and hydralazine 100 mg p.o. this evening.  Patient additionally takes amlodipine 10 mg daily in the mornings. Review of hypertension clinic note from November 2021 states that patient should have been additionally placed on Cardura 2 mg p.o. daily.  We will go ahead and initiate first dose of this now. Cycling cardiac enzymes PRN Nitroglycerin for chest pain .  Monitoring patient on telemetry Placing patient in progressive unit for close clinical monitoring Will provide patient with as needed doses of intravenous Vasotec (temporary use of PRN low dose due to ckd) as needed for markedly elevated blood pressures.  Acute on chronic diastolic CHF (congestive heart failure) ([place]) Patient presenting with symptoms of worsening shortness of breath and pillow orthopnea consistent with acute congestive heart failure Work-up reveals elevated BNP and evidence of acute pulmonary edema on chest imaging Patient has been placed on intravenous diuretics, 40 mg intravenously twice daily Strict input and output monitoring Supplemental oxygen for bouts of hypoxia Monitoring renal function and electrolytes with serial chemistires Daily weights    Elevated troponin level not due myocardial infarction Patient  presenting with elevated troponin of 113. Patient is currently chest pain-free however due to patient's several month history of exertional chest pain even with minimal exertion will consult cardiology.  Second troponin is 114, revealing a flat trajectory of elevation making plaque rupture unlikely. Cycling cardiac enzymes Continue to monitor patient on telemetry Continue aspirin, Coreg, statin  use   Stage 4 chronic kidney disease (HCC) Strict intake and output monitoring Creatinine near baseline Minimizing nephrotoxic agents as much as possible Serial chemistries to monitor renal function and electrolytes   Chronic respiratory failure with hypoxia (HCC) Longstanding known history of chronic respiratory failure on 4 L of oxygen via nasal cannula at home No evidence of need for increased oxygen requirement  Type 2 diabetes mellitus with stage 4 chronic kidney disease, with long-term current use of insulin ([place]) Patient been placed on Accu-Cheks before every meal and nightly with sliding scale insulin Continue home regimen of insulin therapy Hemoglobin A1C performed in October was 5.9% Diabetic Diet   Mixed hyperlipidemia Continuing home regimen of lipid lowering therapy.   Rectal adenocarcinoma (HCC) Recent diagnosis of rectal adenocarcinoma in 03/2021 Patient has recently been established with Dr. [name] Receiving radiation therapy in the outpatient setting Continue outpatient follow-up at time of discharge  Glaucoma Continue home regimen of eyedrops  GERD without esophagitis Continue home regimen of PPI  OSA on CPAP CPAP QHS per home regimen     Code Status:  Full code  code status decision has been confirmed with: patient Family Communication: deferred   Status is: Inpatient  Remains inpatient appropriate because: Patient presenting with extremely elevated blood pressures with evidence of endorgan injury diagnostic of hypertensive crisis with high risk of rapid  clinical decompensation requiring frequent dosing of intravenous antihypertensives and titration of oral antihypertensives all while being monitored on telemetry on a progressive unit with serial blood testing.  CRITICAL CARE ATTESTATION  Patient is at extremely high risk of morbidity and mortality secondary to hypertensive crisis with markedly elevated blood pressures and evidence of endorgan injury with elevated troponins and acute cardiogenic pulmonary edema.  Patient is requiring titration of multiple intravenous antihypertensives, adjustment of multiple oral antihypertensives, frequent blood test including monitoring of elevated cardiac enzymes as well as provision of intravenous diuretics for concurrent acute cardiogenic pulmonary edema with possible need for expert consultation.  Critical care time spent 46 minutes.        Vernelle Emerald MD [facility] Pager [phone]  If 7PM-7AM, please contact night-coverage [email or web address] Use universal [place] password for that web site. If you do not have the password, please call the hospital operator.  05/02/2021, 5:04 AM

## 2021-05-01 NOTE — Assessment & Plan Note (Addendum)
continue PPI

## 2021-05-01 NOTE — Assessment & Plan Note (Addendum)
-   Patient presenting with marked markedly elevated blood pressures as high as 232/141 - Presence of acute cardiogenic pulmonary edema and elevated troponin with chest pain are concerning for acute hypertensive emergency - s/p NTG and labetalol; restarted on home meds also - BP responded but still above goal (imdur increased to 90 mg daily on 11/10 per cardiology) - continue amlodipine, Coreg, doxazosin, hydralazine, Imdur

## 2021-05-01 NOTE — Assessment & Plan Note (Addendum)
-  patient has history of CKD4. Baseline creat ~ 2.7 - 2.8, eGFR 21-24 - some worsening of renal function on 11/11; most likely was overdiuresis but also at risk for ATN -Appreciate nephrology evaluation.  SPEP/UPEP ordered as well - see palliative care discussion; patient declining HD - okay for lasix 80 mg IV 2-3 times a day if provides comfort

## 2021-05-01 NOTE — ED Triage Notes (Signed)
Pt presents from the cancer center for weakness, diarrhea, and nausea. States pt passed out while trying to get in car this morning to go to radiation appointment. Pt did have radiation today. Pt also was hypertensive at the cancer center today 210/108.

## 2021-05-02 ENCOUNTER — Inpatient Hospital Stay (HOSPITAL_COMMUNITY): Payer: PPO

## 2021-05-02 DIAGNOSIS — R778 Other specified abnormalities of plasma proteins: Secondary | ICD-10-CM | POA: Diagnosis not present

## 2021-05-02 DIAGNOSIS — I5032 Chronic diastolic (congestive) heart failure: Secondary | ICD-10-CM

## 2021-05-02 DIAGNOSIS — Z9989 Dependence on other enabling machines and devices: Secondary | ICD-10-CM

## 2021-05-02 DIAGNOSIS — I5033 Acute on chronic diastolic (congestive) heart failure: Secondary | ICD-10-CM

## 2021-05-02 DIAGNOSIS — N184 Chronic kidney disease, stage 4 (severe): Secondary | ICD-10-CM

## 2021-05-02 DIAGNOSIS — J9611 Chronic respiratory failure with hypoxia: Secondary | ICD-10-CM | POA: Diagnosis not present

## 2021-05-02 DIAGNOSIS — I169 Hypertensive crisis, unspecified: Secondary | ICD-10-CM

## 2021-05-02 DIAGNOSIS — G4733 Obstructive sleep apnea (adult) (pediatric): Secondary | ICD-10-CM | POA: Diagnosis not present

## 2021-05-02 DIAGNOSIS — C20 Malignant neoplasm of rectum: Secondary | ICD-10-CM

## 2021-05-02 LAB — CBG MONITORING, ED
Glucose-Capillary: 136 mg/dL — ABNORMAL HIGH (ref 70–99)
Glucose-Capillary: 155 mg/dL — ABNORMAL HIGH (ref 70–99)

## 2021-05-02 LAB — CBC WITH DIFFERENTIAL/PLATELET
Abs Immature Granulocytes: 0.02 10*3/uL (ref 0.00–0.07)
Basophils Absolute: 0 10*3/uL (ref 0.0–0.1)
Basophils Relative: 1 %
Eosinophils Absolute: 0.1 10*3/uL (ref 0.0–0.5)
Eosinophils Relative: 1 %
HCT: 30.7 % — ABNORMAL LOW (ref 39.0–52.0)
Hemoglobin: 9.4 g/dL — ABNORMAL LOW (ref 13.0–17.0)
Immature Granulocytes: 1 %
Lymphocytes Relative: 16 %
Lymphs Abs: 0.7 10*3/uL (ref 0.7–4.0)
MCH: 30.3 pg (ref 26.0–34.0)
MCHC: 30.6 g/dL (ref 30.0–36.0)
MCV: 99 fL (ref 80.0–100.0)
Monocytes Absolute: 0.5 10*3/uL (ref 0.1–1.0)
Monocytes Relative: 12 %
Neutro Abs: 2.9 10*3/uL (ref 1.7–7.7)
Neutrophils Relative %: 69 %
Platelets: 151 10*3/uL (ref 150–400)
RBC: 3.1 MIL/uL — ABNORMAL LOW (ref 4.22–5.81)
RDW: 18.2 % — ABNORMAL HIGH (ref 11.5–15.5)
WBC: 4.1 10*3/uL (ref 4.0–10.5)
nRBC: 0 % (ref 0.0–0.2)

## 2021-05-02 LAB — ECHOCARDIOGRAM LIMITED
Area-P 1/2: 2.84 cm2
Calc EF: 46.5 %
Height: 68 in
S' Lateral: 2.9 cm
Single Plane A2C EF: 37.1 %
Single Plane A4C EF: 53 %
Weight: 2800 oz

## 2021-05-02 LAB — COMPREHENSIVE METABOLIC PANEL
ALT: 21 U/L (ref 0–44)
AST: 18 U/L (ref 15–41)
Albumin: 2.7 g/dL — ABNORMAL LOW (ref 3.5–5.0)
Alkaline Phosphatase: 86 U/L (ref 38–126)
Anion gap: 4 — ABNORMAL LOW (ref 5–15)
BUN: 40 mg/dL — ABNORMAL HIGH (ref 8–23)
CO2: 26 mmol/L (ref 22–32)
Calcium: 8.8 mg/dL — ABNORMAL LOW (ref 8.9–10.3)
Chloride: 110 mmol/L (ref 98–111)
Creatinine, Ser: 2.4 mg/dL — ABNORMAL HIGH (ref 0.61–1.24)
GFR, Estimated: 28 mL/min — ABNORMAL LOW (ref >60)
Glucose, Bld: 106 mg/dL — ABNORMAL HIGH (ref 70–99)
Potassium: 4.6 mmol/L (ref 3.5–5.1)
Sodium: 140 mmol/L (ref 135–145)
Total Bilirubin: 0.7 mg/dL (ref 0.3–1.2)
Total Protein: 6.9 g/dL (ref 6.5–8.1)

## 2021-05-02 LAB — GLUCOSE, CAPILLARY
Glucose-Capillary: 172 mg/dL — ABNORMAL HIGH (ref 70–99)
Glucose-Capillary: 100 mg/dL — ABNORMAL HIGH (ref 70–99)

## 2021-05-02 LAB — MAGNESIUM: Magnesium: 2 mg/dL (ref 1.7–2.4)

## 2021-05-02 LAB — PROCALCITONIN: Procalcitonin: 0.13 ng/mL

## 2021-05-02 LAB — C-REACTIVE PROTEIN: CRP: 0.6 mg/dL (ref <1.0)

## 2021-05-02 LAB — TROPONIN I (HIGH SENSITIVITY): Troponin I (High Sensitivity): 94 ng/L — ABNORMAL HIGH (ref <18)

## 2021-05-02 MED ORDER — ACETAMINOPHEN 325 MG PO TABS
650.0000 mg | ORAL_TABLET | Freq: Four times a day (QID) | ORAL | Status: DC | PRN
  Administered 2021-05-02 – 2021-05-11 (×3): 650 mg via ORAL
  Filled 2021-05-02 (×3): qty 2

## 2021-05-02 MED ORDER — OXYCODONE HCL 5 MG PO TABS
5.0000 mg | ORAL_TABLET | ORAL | Status: AC | PRN
  Administered 2021-05-02 – 2021-05-03 (×3): 5 mg via ORAL
  Filled 2021-05-02 (×3): qty 1

## 2021-05-02 MED ORDER — ENOXAPARIN SODIUM 30 MG/0.3ML IJ SOSY
30.0000 mg | PREFILLED_SYRINGE | INTRAMUSCULAR | Status: DC
  Administered 2021-05-02 – 2021-05-16 (×15): 30 mg via SUBCUTANEOUS
  Filled 2021-05-02 (×16): qty 0.3

## 2021-05-02 MED ORDER — ALBUTEROL SULFATE (2.5 MG/3ML) 0.083% IN NEBU: 2.5000 mg | INHALATION_SOLUTION | RESPIRATORY_TRACT | Status: DC | PRN

## 2021-05-02 MED ORDER — ONDANSETRON HCL 4 MG PO TABS: 4.0000 mg | ORAL_TABLET | Freq: Four times a day (QID) | ORAL | Status: DC | PRN

## 2021-05-02 MED ORDER — ACETAMINOPHEN 650 MG RE SUPP: 650.0000 mg | Freq: Four times a day (QID) | RECTAL | Status: DC | PRN

## 2021-05-02 MED ORDER — INSULIN ASPART 100 UNIT/ML IJ SOLN
0.0000 [IU] | Freq: Three times a day (TID) | INTRAMUSCULAR | Status: DC
Start: 2021-05-02 — End: 2021-05-16
  Administered 2021-05-02: 3 [IU] via SUBCUTANEOUS
  Administered 2021-05-02: 2 [IU] via SUBCUTANEOUS
  Administered 2021-05-02: 5 [IU] via SUBCUTANEOUS
  Administered 2021-05-03: 3 [IU] via SUBCUTANEOUS
  Administered 2021-05-03 – 2021-05-04 (×3): 2 [IU] via SUBCUTANEOUS
  Administered 2021-05-04: 3 [IU] via SUBCUTANEOUS
  Administered 2021-05-04: 2 [IU] via SUBCUTANEOUS
  Administered 2021-05-05: 3 [IU] via SUBCUTANEOUS
  Administered 2021-05-05: 2 [IU] via SUBCUTANEOUS
  Administered 2021-05-05: 3 [IU] via SUBCUTANEOUS
  Administered 2021-05-06: 5 [IU] via SUBCUTANEOUS
  Administered 2021-05-06 – 2021-05-07 (×3): 3 [IU] via SUBCUTANEOUS
  Administered 2021-05-08: 5 [IU] via SUBCUTANEOUS
  Administered 2021-05-08 – 2021-05-09 (×3): 3 [IU] via SUBCUTANEOUS
  Administered 2021-05-09 (×2): 2 [IU] via SUBCUTANEOUS
  Administered 2021-05-10: 19:00:00 0 [IU] via SUBCUTANEOUS
  Administered 2021-05-10: 21:00:00 3 [IU] via SUBCUTANEOUS
  Administered 2021-05-10: 09:00:00 2 [IU] via SUBCUTANEOUS
  Administered 2021-05-11 (×2): 3 [IU] via SUBCUTANEOUS
  Administered 2021-05-12: 2 [IU] via SUBCUTANEOUS
  Administered 2021-05-12: 3 [IU] via SUBCUTANEOUS
  Administered 2021-05-12 (×2): 2 [IU] via SUBCUTANEOUS
  Administered 2021-05-13: 8 [IU] via SUBCUTANEOUS
  Administered 2021-05-13: 5 [IU] via SUBCUTANEOUS
  Administered 2021-05-13: 3 [IU] via SUBCUTANEOUS
  Administered 2021-05-13: 2 [IU] via SUBCUTANEOUS
  Administered 2021-05-14: 8 [IU] via SUBCUTANEOUS
  Administered 2021-05-14: 2 [IU] via SUBCUTANEOUS
  Administered 2021-05-14: 3 [IU] via SUBCUTANEOUS
  Administered 2021-05-14: 2 [IU] via SUBCUTANEOUS
  Administered 2021-05-15: 5 [IU] via SUBCUTANEOUS
  Administered 2021-05-15: 3 [IU] via SUBCUTANEOUS
  Administered 2021-05-15 (×2): 8 [IU] via SUBCUTANEOUS
  Administered 2021-05-16: 2 [IU] via SUBCUTANEOUS
  Administered 2021-05-16: 8 [IU] via SUBCUTANEOUS
  Filled 2021-05-02: qty 0.15

## 2021-05-02 MED ORDER — ENALAPRILAT 1.25 MG/ML IV SOLN
0.6250 mg | Freq: Four times a day (QID) | INTRAVENOUS | Status: DC | PRN
  Filled 2021-05-02 (×2): qty 0.5

## 2021-05-02 MED ORDER — ONDANSETRON HCL 4 MG/2ML IJ SOLN: 4.0000 mg | Freq: Four times a day (QID) | INTRAMUSCULAR | Status: DC | PRN

## 2021-05-02 MED ORDER — POLYETHYLENE GLYCOL 3350 17 G PO PACK
17.0000 g | PACK | Freq: Every day | ORAL | Status: DC | PRN
  Administered 2021-05-04: 17 g via ORAL
  Filled 2021-05-02 (×2): qty 1

## 2021-05-02 MED ORDER — ALBUTEROL SULFATE (2.5 MG/3ML) 0.083% IN NEBU
2.5000 mg | INHALATION_SOLUTION | Freq: Four times a day (QID) | RESPIRATORY_TRACT | Status: DC
  Administered 2021-05-02 (×3): 2.5 mg via RESPIRATORY_TRACT
  Filled 2021-05-02 (×3): qty 3

## 2021-05-02 NOTE — ED Notes (Signed)
Pt eating breakfast at [facility] at this time.

## 2021-05-02 NOTE — ED Notes (Signed)
Pt complain of chest pain/tightness. PRN nitro administered

## 2021-05-02 NOTE — Consult Note (Addendum)
Cardiology Consultation:   Patient ID: Robert Chaney MRN: [id number]; DOB: 23-Dec-1950  Admit date: 05/01/2021 Date of Consult: 05/02/2021  PCP:  Robert Ebbs, MD   [facility] Providers Cardiologist:  Robert Latch, MD    Patient Profile:   Robert Chaney is a 70 y.o. 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 Cyd Chaney.  History of Present Illness:   Robert Chaney has had several admissions over the past few months for acute on chronic respiratory failure in the setting of acute on chronic diastolic CHF and pneumonia. Most recently he was admitted to the hospital 04/01/2021 - 04/14/2021 after presenting with sudden onset chest pain and shortness of breath.  He was diuresed for acute on chronic diastolic CHF and treated with a course of antibiotics for HCAP.  He was noted to have rectal bleeding that admission and underwent a colonoscopy where he was found to have a fungating ulcerative mass in the rectal, with biopsy confirming colorectal adenocarcinoma.  He required 3 units of PRBC for management of anemia.  He was deemed to not be a surgical candidate and oncology recommended outpatient XRT which was initiated during his hospitalization.  Cardiology evaluated patient during that admission to assist with diuresis.  His last echocardiogram was a limited bubble study 02/2021 which was suggestive of intrapulmonary shunting, with a EF 65-70% and normal LV diastolic function.  Prior to this he had an echocardiogram 01/2021 which showed EF 70-75%, no RWMA, severe LVH c/w HOCM , G1DD, and no significant valvular dysfunction.  His last ischemic evaluation was a NST in 2019 which showed no ischemia.  He had a cardiac MR in 2018  which was highly suggestive of hypertrophic cardiomyopathy with high risk feature.  Over the past 2 to 3 days he has been experiencing progressively worsening shortness of breath and weakness.  Shortness of breath is occurring with less and less activity but improves with rest.  He reported having orthopnea and PND as well as some mild to moderate substernal chest pressure which were relieved with nitroglycerin.  He presented to palliative care clinic 05/01/2021 and was quickly referred to [facility] ED for further evaluation.   Blood pressure has been markedly elevated up to 232/141 and remains quite high.  He is intermittently tachypneic and O2 was increased to 5 L via nasal cannula.  EKG with incomplete interpretation given absence of V2 lead, otherwise sinus rhythm, rate 66 bpm, nonspecific T wave abnormalities, and no STE/D.  Labs are notable for electrolytes WNL, creatinine 2.4 (baseline), hemoglobin 10.9> 9.4, platelet 209> 151, lactate 1.2, CRP 0.6, HsTrop 113> 114> 94, BNP 3367.  CXR showed cardiomegaly with CHF and RUL pneumonia.  CT head showed no acute findings with chronic small vessel ischemic changes and multiple chronic cerebellar infarcts.  CT chest showed CHF with bilateral pleural effusions and focal airspace opacity in the RUL representing possible edema versus pneumonia.  Procalcitonin was low supporting decision to hold off on antibiotics.  He was given Nitropaste and IV labetalol in the ED for management of hypertensive emergency.  He was admitted to medicine and home antihypertensives were continued with addition of Cardura 2 mg daily.  He was started on IV Lasix 40  mg twice daily.  Cardiology asked to evaluate.  Past Medical History:  Diagnosis Date   Altered mental status    a. 05/2017 - adm with blurred vision, somnolence in setting of AKI and high blood sugar.   Anemia    CKD (chronic kidney disease), stage III (HCC)    Diabetes mellitus    Diastolic CHF, chronic ([place])     A.  03/2009 Echo: EF 60-65%, Gr II diast dysfxn   Elevated troponin    a. 2013 - troponin 1.4. b. 2019 - troponin 0.32; neg nuc 07/2017.   Glaucoma    Hyperlipidemia    HYPERCHOLESTEROLEMIA   Hypertension    MARKED LEFT VENTRICULAR HYPERTROPHY BY PREVIOUS ECHOCARDIOGRAM--HE HAS HYPERDYNAMIC LEFT VENTRICULAR SYSTOLIC FUNCTION AND HAS IMPAIRED RELAXATION BY ECHO   Hypertrophic cardiomyopathy (HCC)    NSVT (nonsustained ventricular tachycardia)    Premature atrial contractions    PVC's (premature ventricular contractions)    SVT (supraventricular tachycardia) ([place])     Past Surgical History:  Procedure Laterality Date   BIOPSY  03/03/2021   Procedure: BIOPSY;  Surgeon: Jackquline Denmark, MD;  Location: [place];  Service: Endoscopy;;   BIOPSY  04/06/2021   Procedure: BIOPSY;  Surgeon: [name], MD;  Location: [place];  Service: Gastroenterology;;   BRONCHIAL BIOPSY  03/05/2021   Procedure: BRONCHIAL BIOPSIES;  Surgeon: Candee Furbish, MD;  Location: [facility] ENDOSCOPY;  Service: Pulmonary;;   BRONCHIAL BRUSHINGS  03/05/2021   Procedure: BRONCHIAL BRUSHINGS;  Surgeon: Candee Furbish, MD;  Location: [place];  Service: Pulmonary;;   BRONCHIAL WASHINGS  03/05/2021   Procedure: BRONCHIAL WASHINGS;  Surgeon: Candee Furbish, MD;  Location: [facility] ENDOSCOPY;  Service: Pulmonary;;   COLONOSCOPY WITH PROPOFOL N/A 04/06/2021   Procedure: COLONOSCOPY WITH PROPOFOL;  Surgeon: [name], MD;  Location: [place];  Service: Gastroenterology;  Laterality: N/A;   ESOPHAGOGASTRODUODENOSCOPY (EGD) WITH PROPOFOL N/A 03/03/2021   Procedure: ESOPHAGOGASTRODUODENOSCOPY (EGD) WITH PROPOFOL;  Surgeon: Jackquline Denmark, MD;  Location: [facility] ENDOSCOPY;  Service: Endoscopy;  Laterality: N/A;   HEMOSTASIS CONTROL  03/05/2021   Procedure: HEMOSTASIS CONTROL;  Surgeon: Candee Furbish, MD;  Location: [facility] ENDOSCOPY;  Service: Pulmonary;;   NO PAST SURGERIES     POLYPECTOMY  04/06/2021   Procedure: POLYPECTOMY;   Surgeon: [name], MD;  Location: [place];  Service: Gastroenterology;;   VIDEO BRONCHOSCOPY N/A 03/05/2021   Procedure: VIDEO BRONCHOSCOPY WITH FLUORO;  Surgeon: Candee Furbish, MD;  Location: [facility] ENDOSCOPY;  Service: Pulmonary;  Laterality: N/A;     Home Medications:  Prior to Admission medications   Medication Sig Start Date End Date Taking? Authorizing Provider  amLODipine (NORVASC) 10 MG tablet Take 1 tablet (10 mg total) by mouth daily. 05/13/20  Yes Shawna Clamp, MD  atorvastatin (LIPITOR) 20 MG tablet Take 1 tablet (20 mg total) by mouth daily. 03/16/18  Yes Dorothy Spark, MD  brimonidine (ALPHAGAN) 0.2 % ophthalmic solution Place 1 drop into the right eye 2 (two) times daily. 10/06/19  Yes [provider]  carvedilol (COREG) 25 MG tablet Take 1 tablet (25 mg total) by mouth 2 (two) times daily. 04/14/21  Yes Little Ishikawa, MD  cholecalciferol (VITAMIN D3) 25 MCG (1000 UT) tablet Take 1,000 Units by mouth daily.   Yes [provider]  docusate sodium (COLACE) 100 MG capsule Take 2 capsules (200 mg total) by mouth 2 (two) times daily. 03/10/21  Yes Thurnell Lose, MD  dorzolamide (TRUSOPT) 2 %  ophthalmic solution Place 1 drop into both eyes 2 (two) times daily. 12/04/20  Yes [provider]  furosemide (LASIX) 40 MG tablet Take 1 tablet (40 mg total) by mouth 2 (two) times daily. 03/24/21  Yes Mercy Riding, MD  hydrALAZINE (APRESOLINE) 100 MG tablet Take 1 tablet (100 mg total) by mouth 3 (three) times daily. 03/16/20  Yes Robert Latch, MD  insulin glargine (LANTUS SOLOSTAR) 100 UNIT/ML Solostar Pen Inject 10 Units into the skin daily. Patient taking differently: Inject 3-6 Units into the skin 3 (three) times daily. Sliding scale 03/22/21  Yes Mercy Riding, MD  isosorbide mononitrate (IMDUR) 60 MG 24 hr tablet Take 60 mg by mouth daily. 04/27/20  Yes [provider]  Continuous Blood Gluc Sensor (DEXCOM G6 SENSOR) MISC 1 Device  by Does not apply route as directed. 01/12/21   Shamleffer, Melanie Crazier, MD  Continuous Blood Gluc Transmit (DEXCOM G6 TRANSMITTER) MISC 1 Device by Does not apply route as directed. 01/12/21   Shamleffer, Melanie Crazier, MD  glucose blood (ONETOUCH VERIO) test strip USE 1 STRIP TO CHECK GLUCOSE THREE TIMES DAILY AS DIRECTED 07/24/20   Shamleffer, Melanie Crazier, MD  Insulin Pen Needle 32G X 4 MM MISC Use as directed with insulin 03/22/21   Gonfa, Charlesetta Ivory, MD  latanoprost (XALATAN) 0.005 % ophthalmic solution Place 1 drop into the left eye at bedtime. 10/06/19   [provider]  nitroGLYCERIN (NITROSTAT) 0.4 MG SL tablet DISSOLVE ONE TABLET UNDER THE TONGUE EVERY 5 MINUTES AS NEEDED FOR CHEST PAIN. Patient taking differently: Place 0.4 mg under the tongue every 5 (five) minutes as needed for chest pain. 03/27/20   Dorothy Spark, MD  oxyCODONE 7.5 MG TABS Take 7.5 mg by mouth every 4 (four) hours as needed for moderate pain. Patient not taking: Reported on 05/01/2021 04/14/21   Little Ishikawa, MD  pantoprazole (PROTONIX) 40 MG tablet Take 1 tablet (40 mg total) by mouth 2 (two) times daily before a meal. 03/10/21   Thurnell Lose, MD  polyethylene glycol (MIRALAX) 17 g packet Take 17 g by mouth daily. 03/10/21   Thurnell Lose, MD    Inpatient Medications: Scheduled Meds:  albuterol  2.5 mg Nebulization Q6H   amLODipine  10 mg Oral Daily   atorvastatin  20 mg Oral Daily   brimonidine  1 drop Right Eye BID   carvedilol  25 mg Oral BID WC   docusate sodium  200 mg Oral BID   dorzolamide  1 drop Both Eyes BID   doxazosin  2 mg Oral QHS   enoxaparin (LOVENOX) injection  30 mg Subcutaneous Q24H   furosemide  40 mg Intravenous BID   hydrALAZINE  100 mg Oral Q8H   insulin aspart  0-15 Units Subcutaneous TID AC & HS   insulin glargine-yfgn  10 Units Subcutaneous Daily   isosorbide mononitrate  60 mg Oral Daily   latanoprost  1 drop Left Eye QHS   pantoprazole  40 mg Oral  BID AC   Continuous Infusions:  PRN Meds: acetaminophen **OR** acetaminophen, enalaprilat, nitroGLYCERIN, ondansetron **OR** ondansetron (ZOFRAN) IV, polyethylene glycol  Allergies:   No Known Allergies  Social History:   Social History   Socioeconomic History   Marital status: Legally Separated    Spouse name: Not on file   Number of children: Not on file   Years of education: Not on file   Highest education level: Not on file  Occupational History  Occupation: retired    Fish farm manager: [employer]    Comment: At [employer]  Tobacco Use   Smoking status: Never   Smokeless tobacco: Never  Vaping Use   Vaping Use: Never used  Substance and Sexual Activity   Alcohol use: No   Drug use: No   Sexual activity: Never  Other Topics Concern   Not on file  Social History Narrative   Originally from [place].    Social Determinants of Health   Financial Resource Strain: Medium Risk   Difficulty of Paying Living Expenses: Somewhat hard  Food Insecurity: Landscape architect Present   Worried About Charity fundraiser in the Last Year: Sometimes true   Arboriculturist in the Last Year: Sometimes true  Transportation Needs: Public librarian (Medical): Yes   Lack of Transportation (Non-Medical): Yes  Physical Activity: Not on file  Stress: Not on file  Social Connections: Not on file  Intimate Partner Violence: Not on file    Family History:    Family History  Problem Relation Age of Onset   Hypertension Father    Stroke Father    Hypertension Mother    Diabetes Brother    Hypertension Brother    Diabetes Brother      ROS:  Please see the history of present illness.   All other ROS reviewed and negative.     Physical Exam/Data:   Vitals:   05/02/21 0601 05/02/21 0603 05/02/21 0630 05/02/21 0700  BP: (!) 208/92 (!) 205/93 (!) 170/86 (!) 185/77  Pulse: 61 60 65 60  Resp: 16 16 12 16   Temp:      TempSrc:      SpO2:  91% 92% 96% 90%  Weight:      Height:       No intake or output data in the 24 hours ending 05/02/21 0749 Last 3 Weights 05/01/2021 05/01/2021 04/14/2021  Weight (lbs) 175 lb 175 lb 169 lb 12.1 oz  Weight (kg) 79.379 kg 79.379 kg 77 kg     Body mass index is 26.61 kg/m.   General appearance: alert, appears older than stated age, and no distress Neck: JVD - several cm above sternal notch, no carotid bruit, and thyroid not enlarged, symmetric, no tenderness/mass/nodules Lungs: diminished breath sounds bibasilar Heart: regular rate and rhythm Abdomen: soft, non-tender; bowel sounds normal; no masses,  no organomegaly Extremities: edema 1+ bilateral sockline Pulses: 2+ and symmetric Skin: Skin color, texture, turgor normal. No rashes or lesions Neurologic: Grossly normal Psych: Pleasant   EKG:  The EKG was personally reviewed and demonstrates:  Sinus rhythm at 66 Telemetry:  Telemetry was personally reviewed and demonstrates:  Sinus rhythm  Relevant CV Studies: Limited echocardiogram bubble study 02/2021: 1. Limited echo for shunting in the setting of hypoxia   2. Agitated saline contrast bubble study was positive with shunting  observed after >6 cardiac cycles suggestive of intrapulmonary shunting. A  moderate number of microbubbles were noted, consistent with a more  significant shunt.   3. Left ventricular ejection fraction, by estimation, is 65 to 70%. The  left ventricle has normal function.   Comparison(s): Changes from prior study are noted. 01/30/2021: LVEF 70-75%.   Echocardiogram 01/2021: 1. Peak outflow gradient velocity 3.35 m/s, peak gradient 45 mmHg. Left  ventricular ejection fraction, by estimation, is 70 to 75%. The left  ventricle has hyperdynamic function. The left ventricle has no regional  wall motion abnormalities. There is  severe left ventricular hypertrophy. Left ventricular diastolic parameters  are consistent with Grade I diastolic dysfunction (impaired  relaxation).   2. Right ventricular systolic function is normal. The right ventricular  size is normal.   3. The mitral valve is normal in structure. No evidence of mitral valve  regurgitation. No evidence of mitral stenosis.   4. The aortic valve is normal in structure. Aortic valve regurgitation is  not visualized. No aortic stenosis is present.   5. The inferior vena cava is normal in size with greater than 50%  respiratory variability, suggesting right atrial pressure of 3 mmHg.   Conclusion(s)/Recommendation(s): Findings consistent with hypertrophic  obstructive cardiomyopathy.   NST 2019: Electrically nondiagnositic due to baseline LVH with strain Probable normal perfusion and soft tissue attenuation (diaphragm) No ischemia LVEF 585 Low risk scan  Cardiac MR 2018: 1. Normal left ventricular size with severe left ventricular hypertrophy with basal septal thickness of 21 mm and normal systolic function (LVEF = 62%). There are no regional wall motion abnormalities.   There are diffuse patchy late gadolinium enhancement in the basal anterior, anteroseptal, inferior, inferolateral, mid anterior, inferior, inferolateral and apical inferior walls.   2.  Normal right ventricular size, thickness and systolic volume.   3.  Mild mitral and tricuspid regurgitation.   Collectively, these findings are consistent with hypertrophic cardiomyopathy with high risk feature - presence of LGE in the 8 myocardial segments.  Laboratory Data:  High Sensitivity Troponin:   Recent Labs  Lab 04/08/21 0338 05/01/21 1713 05/01/21 2019 05/02/21 0510  TROPONINIHS 82* 113* 114* 94*     Chemistry Recent Labs  Lab 05/01/21 1713 05/01/21 1832 05/01/21 2359 05/02/21 0208  NA 141 144 140  --   K 4.9 5.0 4.6  --   CL 108 109 110  --   CO2 28  --  26  --   GLUCOSE 128* 124* 106*  --   BUN 41* 44* 40*  --   CREATININE 2.41* 2.60* 2.40*  --   CALCIUM 9.4  --  8.8*  --   MG  --   --   --   2.0  GFRNONAA 28*  --  28*  --   ANIONGAP 5  --  4*  --     Recent Labs  Lab 05/01/21 1713 05/01/21 2359  PROT 8.2* 6.9  ALBUMIN 3.4* 2.7*  AST 21 18  ALT 26 21  ALKPHOS 106 86  BILITOT 0.8 0.7   Lipids No results for input(s): CHOL, TRIG, HDL, LABVLDL, LDLCALC, CHOLHDL in the last 168 hours.  Hematology Recent Labs  Lab 05/01/21 1713 05/01/21 1832 05/02/21 0208  WBC 4.7  --  4.1  RBC 3.70*  --  3.10*  HGB 10.9* 12.6* 9.4*  HCT 36.7* 37.0* 30.7*  MCV 99.2  --  99.0  MCH 29.5  --  30.3  MCHC 29.7*  --  30.6  RDW 18.3*  --  18.2*  PLT 209  --  151   Thyroid No results for input(s): TSH, FREET4 in the last 168 hours.  BNP Recent Labs  Lab 05/01/21 2019  BNP 3,367.1*    DDimer No results for input(s): DDIMER in the last 168 hours.   Radiology/Studies:  DG Chest 2 View  Result Date: 05/01/2021 CLINICAL DATA:  Chest pain EXAM: CHEST - 2 VIEW COMPARISON:  Chest radiograph dated 04/01/2021. FINDINGS: Cardiomegaly with vascular congestion and mild edema. Right upper lobe opacity consistent with pneumonia. Underlying mass is not excluded  clinical correlation and follow-up to resolution recommended. Small bilateral pleural effusions suspected. No pneumothorax. Atherosclerotic calcification of the aorta. Degenerative changes of the spine. No acute osseous pathology. IMPRESSION: 1. Cardiomegaly with findings of CHF. 2. Right upper lobe pneumonia. Electronically Signed   By: Anner Crete M.D.   On: 05/01/2021 19:36   CT Head Wo Contrast  Result Date: 05/01/2021 CLINICAL DATA:  Altered mental status EXAM: CT HEAD WITHOUT CONTRAST TECHNIQUE: Contiguous axial images were obtained from the base of the skull through the vertex without intravenous contrast. COMPARISON:  CT brain 01/30/2021 FINDINGS: Brain: No acute territorial infarction, hemorrhage or intracranial mass. 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 Mild atrophy. Moderate chronic small vessel ischemic changes of  the white matter. Stable ventricle size. Vascular: No hyperdense vessels.  Carotid vascular calcification Skull: Normal. Negative for fracture or focal lesion. Sinuses/Orbits: Mucosal thickening in the sinuses Other: None IMPRESSION: 1. No CT evidence for acute intracranial abnormality. 2. Atrophy and chronic small vessel ischemic changes of the white matter. Multiple chronic cerebellar infarcts. Electronically Signed   By: Donavan Foil M.D.   On: 05/01/2021 19:38   CT CHEST WO CONTRAST  Result Date: 05/02/2021 CLINICAL DATA:  Abnormal radiograph. EXAM: CT CHEST WITHOUT CONTRAST TECHNIQUE: Multidetector CT imaging of the chest was performed following the standard protocol without IV contrast. COMPARISON:  Chest radiograph dated 05/01/2021. FINDINGS: Evaluation of this exam is limited in the absence of intravenous contrast. Cardiovascular: There is cardiomegaly. No pericardial effusion. There is coronary vascular calcification. Moderate atherosclerotic calcification of the thoracic aorta. No aneurysmal dilatation. There is mild dilatation of the main pulmonary trunk suggestive of pulmonary hypertension. Mediastinum/Nodes: Evaluation of hilar lymph nodes is limited due to consolidative changes of the lungs and in the absence of intravenous contrast. The esophagus is grossly unremarkable. No mediastinal fluid collection. Lungs/Pleura: Moderate right and small left pleural effusions with associated partial compressive atelectasis of the lower lobes. There is diffuse interstitial and interlobular septal prominence consistent with edema. There is a focal area of airspace opacity in the right upper lobe which may represent edema but concerning for pneumonia. There is no pneumothorax. The central airways are patent. Upper Abdomen: Mild fullness of the left renal collecting system. Musculoskeletal: Degenerative changes of the spine. No acute osseous pathology. IMPRESSION: 1. Cardiomegaly with findings of CHF and  bilateral pleural effusions. 2. Focal area of airspace opacity in the right upper lobe may represent edema but concerning for pneumonia. 3. Aortic Atherosclerosis (ICD10-I70.0). Electronically Signed   By: Anner Crete M.D.   On: 05/02/2021 00:23     Assessment and Plan:   1.  Acute on chronic respiratory failure in patient on home O2 4 L [place] with chronic diastolic CHF: Patient presented with progressive DOE over the past 2 to 3 days with associated orthopnea and intermittent exertional chest discomfort.  He has a longstanding history of poorly controlled hypertension and blood pressures have been markedly elevated this admission which was likely contributing to his pulmonary edema.  His BNP was elevated to 3367. He is CXR and CT chest suggestive CHF with bilateral pleural effusions as well as concern for RUL pneumonia.  Procalcitonin was low supporting decision to hold off on antibiotics.  Last echo was a limited study 02/2021 which showed EF 65-70%.  He was started on IV Lasix 40 mg twice daily.  No I&Os documented in the ED and weight is 175 pounds which is up from 169 pounds at discharge 04/14/2021.  Exam today with  dullness in the lung bases, elevated JVP and LE edema. - Continue IV Lasix 40 mg twice daily - Continue to monitor strict I&Os and daily weights - Continue to monitor electrolytes closely and replete as needed to maintain K >4 and Mg >2 -Continue aggressive BP control noted for as this is likely contributing  2.  Chest pain and elevated troponins in patient with coronary artery calcifications on CT: Patient reported intermittent chest discomfort with activity which is relieved with rest and nitroglycerin.  EKG is nonischemic. HsTrop 113> 114> 94; low flat trend not consistent with ACS.  He had a negative NST in 2019 however definitive ischemic evaluation has been deferred given underlying CKD stage IV.  Suspect [place] elevation is demand ischemia in the setting of hypertensive emergency  and acute on chronic respiratory failure/diastolic CHF. -  We will update a limited echo to reevaluate LV function and wall motion -Would have a pretty high threshold for invasive ischemic evaluation given CKD stage IV and comorbidities. - Consider increasing Imdur to 90 mg daily - Continue amlodipine for antianginal - Continue statin  3.  Hypertensive emergency: Patient presented with markedly elevated blood pressures.  Historically BP has been very difficult to control despite multiple medications.  He was continued on home amlodipine 10 mg daily, carvedilol 25 mg twice daily, hydralazine 100 mg 3 times daily, and Imdur 60 mg daily, with the addition of doxazosin 2 mg daily this admission.  BP remains elevated but improved from peak 232/141.  Management has been limited by CKD - Continue current medications - Would like to avoid clonidine if possible but options are quite limited  4.  HLD: LDL 63 04/2020 -Continue atorvastatin  5.  DM type II: A1c 5.9 04/03/2021 - Continue management per primary team  6.  CKD stage IV: Creatinine 2.4 this admission which is improved from 3.4 during hospitalization 03/2021.  Overall this appears to be better than his recent baseline. - Continue to monitor closely with diuresis  Risk Assessment/Risk Scores:   New York Heart Association (NYHA) Functional Class NYHA Class IV   TIME SPENT WITH PATIENT: 65 minutes of direct patient care. More than 50% of that time was spent on coordination of care and counseling regarding heart failure, CKD, rectal cancer, HOCM, hypertensive urgency.    For questions or updates, please contact [place] Please consult [email or web address] for contact info under   Pixie Casino, MD, FACC, [place] Director of the [facility]  [facility]ovascular [facility] Diplomate of the American Board of Clinical Lipidology Attending Cardiologist  Direct Dial: [phone]   Fax: [phone]  Website:  [email or web address]

## 2021-05-02 NOTE — Progress Notes (Signed)
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  He was referred to the ER from the cancer center.  His dyspnea was worse with minimal exertion and improved with rest.  He denied any PND or orthopnea. On work-up he was found to be significantly hypertensive, 232/141 with mildly elevated troponin, 113 and elevated BNP. CXR showed pulmonary edema and patient was started on Lasix as well as IV labetalol and her medications were resumed. He was admitted for diuresis and further blood pressure control.  Interval History:  Seen in the ER this morning.  Still endorsing some ongoing chest pressure with deep breathing.  His shortness of breath initially from admission has improved some.  Blood pressure was still high when seen but starting to downtrend and was about to receive morning meds.  Assessment & Plan: * Hypertensive crisis - Patient presenting with marked markedly elevated blood pressures as high as 232/141 - Presence of acute cardiogenic pulmonary edema and elevated troponin with chest pain are concerning for acute hypertensive emergency - s/p NTG and labetalol; restarted on home meds also - trying to avoid cardene drip but would be next step if BP refractory; for now BP has been downtrending - continue amlodipine, Coreg, doxazosin, hydralazine, Imdur - Use Vasotec as needed  Acute on chronic diastolic CHF (congestive heart failure) (HCC) - patient presenting with symptoms of worsening shortness of breath and pillow orthopnea - also noted to have pulmonary edema, B/L LE edema,  DOE, pleural effusions -Cardiology also consulted on admission for assistance as well as in setting of indeterminate troponin - Continue diuresis and aggressive blood pressure management -Follow-up repeat limited echo - strict input and output monitoring  Elevated troponin level not due myocardial infarction - Patient presenting with elevated troponin of 113. Likely enzyme leak in setting of hypertensive crisis - denies CP but does have DOE and chest pressure with deep inspiration - no large uptrend with trending - no plans for ischemic evaluation per cardiology unless further criteria, especially in setting of CKD4   Stage 4 chronic kidney disease ([place]) - patient has history of CKD4. Baseline creat ~ 2.7 - 2.8, eGFR 21-24 - avoiding nephrotoxicity as able  Chronic respiratory failure with hypoxia (HCC) - on chronic 4L [place] O2 at home  Type 2 diabetes mellitus with stage 4 chronic kidney disease, with long-term current use of insulin (HCC) - Hemoglobin A1C performed in October was 5.9% - continue diabetic diet  Rectal adenocarcinoma ([place]) - Recent diagnosis of rectal adenocarcinoma in 03/2021 - Patient has recently been established with Dr. [name] - Receiving radiation therapy in the outpatient setting  GERD without esophagitis - continue PPI  Glaucoma - Continue home eyedrops  Mixed hyperlipidemia - continue statin    OSA on CPAP - CPAP QHS per home regimen    Old records reviewed in assessment of this patient  Antimicrobials:   DVT prophylaxis: Lovenox  Code Status:   Code Status: Full Code  Disposition Plan: Status is: Inpatient  Remains inpatient appropriate because: CHF treatment and HTN  Objective: Blood  pressure (!) 152/72, pulse 60, temperature 97.7 F (36.5 C), temperature source Oral, resp. rate 20, height _0  (1.727 m), weight 79.4 kg, SpO2 100 %.  Examination:  Physical Exam Constitutional:      Appearance: Normal appearance.  HENT:      Head: Normocephalic and atraumatic.     Mouth/Throat:     Mouth: Mucous membranes are moist.  Eyes:     Extraocular Movements: Extraocular movements intact.  Cardiovascular:     Rate and Rhythm: Normal rate and regular rhythm.  Pulmonary:     Effort: Pulmonary effort is normal.     Breath sounds: Wheezing present.  Abdominal:     General: Bowel sounds are normal. There is no distension.     Palpations: Abdomen is soft.     Tenderness: There is no abdominal tenderness.  Musculoskeletal:        General: Swelling present. Normal range of motion.     Cervical back: Normal range of motion and neck supple.     Comments: R>L 1-2+ LE edema  Skin:    General: Skin is warm and dry.  Neurological:     General: No focal deficit present.     Mental Status: He is alert.  Psychiatric:        Mood and Affect: Mood normal.        Behavior: Behavior normal.     Consultants:  Cardiology  Procedures:    Data Reviewed: I have personally reviewed labs and imaging studies     LOS: 1 day  Time spent: Greater than 50% of the 35 minute visit was spent in counseling/coordination of care for the patient as laid out in the A&P.   Dwyane Dee, MD [facility] 05/02/2021, 4:14 PM

## 2021-05-02 NOTE — ED Notes (Signed)
Pt had full linen change. Pt was washed up after BM.

## 2021-05-02 NOTE — Hospital Course (Addendum)
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  He was referred to the ER from the cancer center.  His dyspnea was worse with minimal exertion and improved with rest.  He denied any PND or orthopnea. On work-up he was found to be significantly hypertensive, 232/141 with mildly elevated troponin, 113 and elevated BNP. CXR showed pulmonary edema and patient was started on Lasix as well as IV labetalol and his medications were resumed. He was admitted for diuresis and further blood pressure control. Renal function did not tolerate diuresis.  He underwent evaluation with palliative care due to his decline and lack of improvement.  Patient elected for foregoing hemodialysis and instead focusing on pursuing more of a hospice/palliative route.

## 2021-05-02 NOTE — ED Notes (Signed)
Pt tolerated breathing tx well.

## 2021-05-02 NOTE — ED Notes (Signed)
Pain reassessed with improved results. BP 118/62

## 2021-05-02 NOTE — Assessment & Plan Note (Addendum)
-   CPAP QHS per home regimen

## 2021-05-02 NOTE — ED Notes (Signed)
Pt given lunch tray.

## 2021-05-03 ENCOUNTER — Telehealth: Payer: Self-pay | Admitting: Nurse Practitioner

## 2021-05-03 DIAGNOSIS — I169 Hypertensive crisis, unspecified: Secondary | ICD-10-CM | POA: Diagnosis not present

## 2021-05-03 DIAGNOSIS — R778 Other specified abnormalities of plasma proteins: Secondary | ICD-10-CM | POA: Diagnosis not present

## 2021-05-03 DIAGNOSIS — J9611 Chronic respiratory failure with hypoxia: Secondary | ICD-10-CM | POA: Diagnosis not present

## 2021-05-03 DIAGNOSIS — I5033 Acute on chronic diastolic (congestive) heart failure: Secondary | ICD-10-CM | POA: Diagnosis not present

## 2021-05-03 DIAGNOSIS — C20 Malignant neoplasm of rectum: Secondary | ICD-10-CM | POA: Diagnosis not present

## 2021-05-03 LAB — CBC WITH DIFFERENTIAL/PLATELET
Abs Immature Granulocytes: 0.01 10*3/uL (ref 0.00–0.07)
Basophils Absolute: 0 10*3/uL (ref 0.0–0.1)
Basophils Relative: 0 %
Eosinophils Absolute: 0.1 10*3/uL (ref 0.0–0.5)
Eosinophils Relative: 2 %
HCT: 27.9 % — ABNORMAL LOW (ref 39.0–52.0)
Hemoglobin: 8.4 g/dL — ABNORMAL LOW (ref 13.0–17.0)
Immature Granulocytes: 0 %
Lymphocytes Relative: 24 %
Lymphs Abs: 0.8 10*3/uL (ref 0.7–4.0)
MCH: 30 pg (ref 26.0–34.0)
MCHC: 30.1 g/dL (ref 30.0–36.0)
MCV: 99.6 fL (ref 80.0–100.0)
Monocytes Absolute: 0.4 10*3/uL (ref 0.1–1.0)
Monocytes Relative: 13 %
Neutro Abs: 1.9 10*3/uL (ref 1.7–7.7)
Neutrophils Relative %: 61 %
Platelets: 174 10*3/uL (ref 150–400)
RBC: 2.8 MIL/uL — ABNORMAL LOW (ref 4.22–5.81)
RDW: 18.3 % — ABNORMAL HIGH (ref 11.5–15.5)
WBC: 3.1 10*3/uL — ABNORMAL LOW (ref 4.0–10.5)
nRBC: 0 % (ref 0.0–0.2)

## 2021-05-03 LAB — GLUCOSE, CAPILLARY
Glucose-Capillary: 155 mg/dL — ABNORMAL HIGH (ref 70–99)
Glucose-Capillary: 134 mg/dL — ABNORMAL HIGH (ref 70–99)
Glucose-Capillary: 130 mg/dL — ABNORMAL HIGH (ref 70–99)
Glucose-Capillary: 91 mg/dL (ref 70–99)

## 2021-05-03 LAB — BASIC METABOLIC PANEL
Anion gap: 7 (ref 5–15)
BUN: 52 mg/dL — ABNORMAL HIGH (ref 8–23)
CO2: 26 mmol/L (ref 22–32)
Calcium: 8.5 mg/dL — ABNORMAL LOW (ref 8.9–10.3)
Chloride: 107 mmol/L (ref 98–111)
Creatinine, Ser: 2.78 mg/dL — ABNORMAL HIGH (ref 0.61–1.24)
GFR, Estimated: 23 mL/min — ABNORMAL LOW (ref >60)
Glucose, Bld: 105 mg/dL — ABNORMAL HIGH (ref 70–99)
Potassium: 4.1 mmol/L (ref 3.5–5.1)
Sodium: 140 mmol/L (ref 135–145)

## 2021-05-03 LAB — MAGNESIUM: Magnesium: 1.9 mg/dL (ref 1.7–2.4)

## 2021-05-03 MED ORDER — BOOST PLUS PO LIQD
237.0000 mL | Freq: Three times a day (TID) | ORAL | Status: DC
  Administered 2021-05-03 – 2021-05-16 (×27): 237 mL via ORAL
  Filled 2021-05-03 (×42): qty 237

## 2021-05-03 MED ORDER — ADULT MULTIVITAMIN W/MINERALS CH
1.0000 | ORAL_TABLET | Freq: Every day | ORAL | Status: DC
  Administered 2021-05-03 – 2021-05-16 (×14): 1 via ORAL
  Filled 2021-05-03 (×14): qty 1

## 2021-05-03 MED ORDER — FUROSEMIDE 10 MG/ML IJ SOLN
40.0000 mg | INTRAMUSCULAR | Status: AC
  Administered 2021-05-03: 40 mg via INTRAVENOUS
  Filled 2021-05-03: qty 4

## 2021-05-03 MED ORDER — FUROSEMIDE 10 MG/ML IJ SOLN
80.0000 mg | Freq: Two times a day (BID) | INTRAMUSCULAR | Status: DC
  Administered 2021-05-03 – 2021-05-04 (×2): 80 mg via INTRAVENOUS
  Filled 2021-05-03 (×2): qty 8

## 2021-05-03 MED ORDER — ISOSORBIDE MONONITRATE ER 60 MG PO TB24
90.0000 mg | ORAL_TABLET | Freq: Every day | ORAL | Status: DC
  Administered 2021-05-03 – 2021-05-16 (×14): 90 mg via ORAL
  Filled 2021-05-03 (×14): qty 1

## 2021-05-03 NOTE — Progress Notes (Signed)
RN started 24-hour UPEP/UIFE [employer]/TP 24-hour urine specimen with collection container on ice at 1215.

## 2021-05-03 NOTE — Telephone Encounter (Signed)
Scheduled per 11/8 los, pt has been called and is aware of appt, will also send calender per pt request

## 2021-05-03 NOTE — Progress Notes (Signed)
RN returned call from Education officer, museum, Caren Griffins, from [employer] regarding plans for discharge.  RN communicated information with [facility] case management.

## 2021-05-03 NOTE — Progress Notes (Signed)
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  He was referred to the ER from the cancer center.  His dyspnea was worse with minimal exertion and improved with rest.  He denied any PND or orthopnea. On work-up he was found to be significantly hypertensive, 232/141 with mildly elevated troponin, 113 and elevated BNP. CXR showed pulmonary edema and patient was started on Lasix as well as IV labetalol and her medications were resumed. He was admitted for diuresis and further blood pressure control.  Interval History:  No events overnight.  Sitting in recliner this morning in no distress.  Still endorsing some chest discomfort but breathing is back to baseline he says.  Assessment & Plan: * Hypertensive crisis - Patient presenting with marked markedly elevated blood pressures as high as 232/141 - Presence of acute cardiogenic pulmonary edema and elevated troponin with chest pain are concerning for acute hypertensive emergency - s/p NTG and labetalol; restarted on home meds also - BP responded but still above goal (imdur increased to 90 mg daily on 11/10 per cardiology) - continue amlodipine, Coreg, doxazosin, hydralazine, Imdur - Use Vasotec as needed  Acute on chronic diastolic CHF (congestive heart failure) (HCC) - patient presenting with symptoms of worsening shortness of breath and pillow orthopnea - also noted to have pulmonary edema, B/L LE edema, DOE, pleural effusions -Cardiology also consulted on admission for assistance as well as in setting of indeterminate troponin -  Continue diuresis and aggressive blood pressure management -limited echo repeated on admission: EF 60-65%, Gr II DD - strict input and output monitoring  Elevated troponin level not due myocardial infarction - Patient presenting with elevated troponin of 113. Likely enzyme leak in setting of hypertensive crisis - denies CP but does have DOE and chest pressure with deep inspiration - no large uptrend with trending - no plans for ischemic evaluation per cardiology unless further criteria, especially in setting of CKD4   Stage 4 chronic kidney disease ([place]) - patient has history of CKD4. Baseline creat ~ 2.7 - 2.8, eGFR 21-24 - avoiding nephrotoxicity as able  Chronic respiratory failure with hypoxia (HCC) - on chronic 4L [place] O2 at home  Type 2 diabetes mellitus with stage 4 chronic kidney disease, with long-term current use of insulin (HCC) - Hemoglobin A1C performed in October was 5.9% - continue diabetic diet  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[place]) - patient has carried this diagnosis previously; repeat echo this admission showing a "global longitudinal strain". This appears to raise some consideration that underlying amyloid may be playing a factor - cardiology following; patient will have further testing likely outpt with possible PYP scan; follow up SPEP/UPEP as well   Rectal adenocarcinoma ([place]) - Recent diagnosis of rectal adenocarcinoma in 03/2021 - Patient has recently been established with Dr. [name] - Receiving radiation therapy in the outpatient setting  GERD without esophagitis - continue PPI  Glaucoma - Continue home eyedrops  Mixed hyperlipidemia - continue statin    OSA on CPAP - CPAP QHS per home regimen    Old records reviewed  in assessment of this patient  Antimicrobials:   DVT prophylaxis: Lovenox  Code Status:   Code Status: Full Code  Disposition Plan: Status is: Inpatient   Objective: Blood pressure 128/73, pulse (!) 57,  temperature (!) 97.5 F (36.4 C), resp. rate 20, height '5\' 8"'  (1.727 m), weight 76.6 kg, SpO2 98 %.  Examination:  Physical Exam Constitutional:      Appearance: Normal appearance.  HENT:     Head: Normocephalic and atraumatic.     Mouth/Throat:     Mouth: Mucous membranes are moist.  Eyes:     Extraocular Movements: Extraocular movements intact.  Cardiovascular:     Rate and Rhythm: Normal rate and regular rhythm.  Pulmonary:     Effort: Pulmonary effort is normal.     Breath sounds: Wheezing present.  Abdominal:     General: Bowel sounds are normal. There is no distension.     Palpations: Abdomen is soft.     Tenderness: There is no abdominal tenderness.  Musculoskeletal:        General: Swelling present. Normal range of motion.     Cervical back: Normal range of motion and neck supple.     Comments: R>L 1-2+ LE edema  Skin:    General: Skin is warm and dry.  Neurological:     General: No focal deficit present.     Mental Status: He is alert.  Psychiatric:        Mood and Affect: Mood normal.        Behavior: Behavior normal.     Consultants:  Cardiology  Procedures:    Data Reviewed: I have personally reviewed labs and imaging studies     LOS: 2 days  Time spent: Greater than 50% of the 35 minute visit was spent in counseling/coordination of care for the patient as laid out in the A&P.   Dwyane Dee, MD [facility] 05/03/2021, 3:02 PM

## 2021-05-03 NOTE — Progress Notes (Addendum)
Progress Note  Patient Name: Robert Chaney Date of Encounter: 05/03/2021  [facility] Cardiologist: Skeet Latch, MD   Subjective   No acute overnight events. Patient states his breathing is slightly better but still not back to baseline. He also continues to report intermittent chest pain that occurs randomly. Occurs both at rest and with exertion but is not always brought on by exertion.  Inpatient Medications    Scheduled Meds:  amLODipine  10 mg Oral Daily   atorvastatin  20 mg Oral Daily   brimonidine  1 drop Right Eye BID   carvedilol  25 mg Oral BID WC   docusate sodium  200 mg Oral BID   dorzolamide  1 drop Both Eyes BID   doxazosin  2 mg Oral QHS   enoxaparin (LOVENOX) injection  30 mg Subcutaneous Q24H   furosemide  40 mg Intravenous BID   hydrALAZINE  100 mg Oral Q8H   insulin aspart  0-15 Units Subcutaneous TID AC & HS   insulin glargine-yfgn  10 Units Subcutaneous Daily   isosorbide mononitrate  60 mg Oral Daily   latanoprost  1 drop Left Eye QHS   pantoprazole  40 mg Oral BID AC   Continuous Infusions:  PRN Meds: acetaminophen **OR** acetaminophen, albuterol, enalaprilat, nitroGLYCERIN, ondansetron **OR** ondansetron (ZOFRAN) IV, oxyCODONE, polyethylene glycol   Vital Signs    Vitals:   05/02/21 2147 05/03/21 0217 05/03/21 0432 05/03/21 0628  BP: 131/60 (!) 115/57  (!) 170/75  Pulse: (!) 59 74  60  Resp: 18 16  16   Temp: 97.6 F (36.4 C) 98.6 F (37 C)  97.9 F (36.6 C)  TempSrc:      SpO2: 94% 100%  97%  Weight:   76.6 kg   Height:        Intake/Output Summary (Last 24 hours) at 05/03/2021 0752 Last data filed at 05/02/2021 1843 Gross per 24 hour  Intake 680 ml  Output --  Net 680 ml   Last 3 Weights 05/03/2021 05/02/2021 05/01/2021  Weight (lbs) 168 lb 14 oz 168 lb 10.4 oz 175 lb  Weight (kg) 76.6 kg 76.5 kg 79.379 kg      Telemetry    Sinus rhythm with rates in the 50s to 60s. - Personally Reviewed  ECG    No new ECG  tracing today. - Personally Reviewed  Physical Exam   GEN: No acute distress.   Neck: Positive hepatojugular reflux.  Cardiac: RRR. No significant murmurs, rubs, or gallops.  Respiratory: Clear to auscultation bilaterally. No wheezes, rhonchi, or rales. GI: Soft, non-distended, and non-tender. MS: Trace lower extremity edema bilaterally. No deformity. Skin: Warm and dry. Neuro:  No focal deficits. Psych: Normal affect. Responds appropriately.  Labs    High Sensitivity Troponin:   Recent Labs  Lab 04/08/21 0338 05/01/21 1713 05/01/21 2019 05/02/21 0510  TROPONINIHS 82* 113* 114* 94*     Chemistry Recent Labs  Lab 05/01/21 1713 05/01/21 1832 05/01/21 2359 05/02/21 0208 05/03/21 0434  NA 141 144 140  --  140  K 4.9 5.0 4.6  --  4.1  CL 108 109 110  --  107  CO2 28  --  26  --  26  GLUCOSE 128* 124* 106*  --  105*  BUN 41* 44* 40*  --  52*  CREATININE 2.41* 2.60* 2.40*  --  2.78*  CALCIUM 9.4  --  8.8*  --  8.5*  MG  --   --   --  2.0 1.9  PROT 8.2*  --  6.9  --   --   ALBUMIN 3.4*  --  2.7*  --   --   AST 21  --  18  --   --   ALT 26  --  21  --   --   ALKPHOS 106  --  86  --   --   BILITOT 0.8  --  0.7  --   --   GFRNONAA 28*  --  28*  --  23*  ANIONGAP 5  --  4*  --  7    Lipids No results for input(s): CHOL, TRIG, HDL, LABVLDL, LDLCALC, CHOLHDL in the last 168 hours.  Hematology Recent Labs  Lab 05/01/21 1713 05/01/21 1832 05/02/21 0208 05/03/21 0434  WBC 4.7  --  4.1 3.1*  RBC 3.70*  --  3.10* 2.80*  HGB 10.9* 12.6* 9.4* 8.4*  HCT 36.7* 37.0* 30.7* 27.9*  MCV 99.2  --  99.0 99.6  MCH 29.5  --  30.3 30.0  MCHC 29.7*  --  30.6 30.1  RDW 18.3*  --  18.2* 18.3*  PLT 209  --  151 174   Thyroid No results for input(s): TSH, FREET4 in the last 168 hours.  BNP Recent Labs  Lab 05/01/21 2019  BNP 3,367.1*    DDimer No results for input(s): DDIMER in the last 168 hours.   Radiology    DG Chest 2 View  Result Date: 05/01/2021 CLINICAL DATA:   Chest pain EXAM: CHEST - 2 VIEW COMPARISON:  Chest radiograph dated 04/01/2021. FINDINGS: Cardiomegaly with vascular congestion and mild edema. Right upper lobe opacity consistent with pneumonia. Underlying mass is not excluded clinical correlation and follow-up to resolution recommended. Small bilateral pleural effusions suspected. No pneumothorax. Atherosclerotic calcification of the aorta. Degenerative changes of the spine. No acute osseous pathology. IMPRESSION: 1. Cardiomegaly with findings of CHF. 2. Right upper lobe pneumonia. Electronically Signed   By: Anner Crete M.D.   On: 05/01/2021 19:36   CT Head Wo Contrast  Result Date: 05/01/2021 CLINICAL DATA:  Altered mental status EXAM: CT HEAD WITHOUT CONTRAST TECHNIQUE: Contiguous axial images were obtained from the base of the skull through the vertex without intravenous contrast. COMPARISON:  CT brain 01/30/2021 FINDINGS: Brain: No acute territorial infarction, hemorrhage or intracranial mass. 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 Mild atrophy. Moderate chronic small vessel ischemic changes of the white matter. Stable ventricle size. Vascular: No hyperdense vessels.  Carotid vascular calcification Skull: Normal. Negative for fracture or focal lesion. Sinuses/Orbits: Mucosal thickening in the sinuses Other: None IMPRESSION: 1. No CT evidence for acute intracranial abnormality. 2. Atrophy and chronic small vessel ischemic changes of the white matter. Multiple chronic cerebellar infarcts. Electronically Signed   By: Donavan Foil M.D.   On: 05/01/2021 19:38   CT CHEST WO CONTRAST  Result Date: 05/02/2021 CLINICAL DATA:  Abnormal radiograph. EXAM: CT CHEST WITHOUT CONTRAST TECHNIQUE: Multidetector CT imaging of the chest was performed following the standard protocol without IV contrast. COMPARISON:  Chest radiograph dated 05/01/2021. FINDINGS: Evaluation of this exam is limited in the absence of intravenous contrast. Cardiovascular: There  is cardiomegaly. No pericardial effusion. There is coronary vascular calcification. Moderate atherosclerotic calcification of the thoracic aorta. No aneurysmal dilatation. There is mild dilatation of the main pulmonary trunk suggestive of pulmonary hypertension. Mediastinum/Nodes: Evaluation of hilar lymph nodes is limited due to consolidative changes of the lungs and in the absence of intravenous contrast. The esophagus  is grossly unremarkable. No mediastinal fluid collection. Lungs/Pleura: Moderate right and small left pleural effusions with associated partial compressive atelectasis of the lower lobes. There is diffuse interstitial and interlobular septal prominence consistent with edema. There is a focal area of airspace opacity in the right upper lobe which may represent edema but concerning for pneumonia. There is no pneumothorax. The central airways are patent. Upper Abdomen: Mild fullness of the left renal collecting system. Musculoskeletal: Degenerative changes of the spine. No acute osseous pathology. IMPRESSION: 1. Cardiomegaly with findings of CHF and bilateral pleural effusions. 2. Focal area of airspace opacity in the right upper lobe may represent edema but concerning for pneumonia. 3. Aortic Atherosclerosis (ICD10-I70.0). Electronically Signed   By: Anner Crete M.D.   On: 05/02/2021 00:23   ECHOCARDIOGRAM LIMITED  Result Date: 05/02/2021    ECHOCARDIOGRAM LIMITED REPORT   Patient Name:   Robert Chaney Date of Exam: 05/02/2021 Medical Rec #:  [id number]     Height:       68.0 in Accession #:    [id number]    Weight:       175.0 lb Date of Birth:  [birth year]      BSA:          1.931 m Patient Age:    70 years      BP:           141/73 mmHg Patient Gender: M             HR:           62 bpm. Exam Location:  Inpatient Procedure: Limited Echo, Cardiac Doppler and Limited Color Doppler Indications:    CHF, HCM  History:        Patient has prior history of Echocardiogram examinations, most                  recent 03/19/2021. CHF and Hypertrophic Cardiomyopathy,                 Signs/Symptoms:Elevated Troponin; Risk Factors:Diabetes,                 Hypertension and HLD.  Sonographer:    Glo Herring Referring Phys: [id number] [place]  1. Left ventricular ejection fraction, by estimation, is 60 to 65%. The left ventricle has normal function. The left ventricle has no regional wall motion abnormalities. There is severe left ventricular hypertrophy. Left ventricular diastolic parameters  are consistent with Grade II diastolic dysfunction (pseudonormalization). The average left ventricular global longitudinal strain is -11.1 %. The global longitudinal strain is abnormal, there is relative preservation of strain at the apex (bullseye appearance). No LV outflow tract gradient.  2. Right ventricular systolic function is mildly reduced. The right ventricular size is normal. Mildly increased right ventricular wall thickness. Tricuspid regurgitation signal is inadequate for assessing PA pressure.  3. The mitral valve is normal in structure, no systolic anterior motion. Trivial mitral valve regurgitation. No evidence of mitral stenosis.  4. The aortic valve is tricuspid. Aortic valve regurgitation is trivial. No aortic stenosis is present.  5. The inferior vena cava is dilated in size with >50% respiratory variability, suggesting right atrial pressure of 8 mmHg.  6. The patient carries a diagnosis of hypertrophic cardiomyopathy. 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 FINDINGS  Left Ventricle: Left ventricular ejection fraction, by estimation, is 60 to 65%. The left ventricle has  normal function. The left ventricle has no regional wall motion abnormalities. The average left ventricular global longitudinal strain is -11.1 %. The global longitudinal strain is abnormal. The left ventricular internal cavity size  was normal in size. There is severe left ventricular hypertrophy. Left ventricular diastolic parameters are consistent with Grade II diastolic dysfunction (pseudonormalization). Right Ventricle: The right ventricular size is normal. Mildly increased right ventricular wall thickness. Right ventricular systolic function is mildly reduced. Tricuspid regurgitation signal is inadequate for assessing PA pressure. Pericardium: There is no evidence of pericardial effusion. Mitral Valve: The mitral valve is normal in structure. Trivial mitral valve regurgitation. No evidence of mitral valve stenosis. Tricuspid Valve: The tricuspid valve is normal in structure. Tricuspid valve regurgitation is not demonstrated. Aortic Valve: The aortic valve is tricuspid. Aortic valve regurgitation is trivial. No aortic stenosis is present. Pulmonic Valve: The pulmonic valve was normal in structure. Pulmonic valve regurgitation is not visualized. Venous: The inferior vena cava is dilated in size with greater than 50% respiratory variability, suggesting right atrial pressure of 8 mmHg. LEFT VENTRICLE PLAX 2D LVIDd:         4.00 cm      Diastology LVIDs:         2.90 cm      LV e' medial:    5.22 cm/s LV PW:         1.80 cm      LV E/e' medial:  16.7 LV IVS:        1.80 cm      LV e' lateral:   5.55 cm/s                             LV E/e' lateral: 15.7  LV Volumes (MOD)            2D Longitudinal Strain LV vol d, MOD A2C: 102.0 ml 2D Strain GLS Avg:     -11.1 % LV vol d, MOD A4C: 132.0 ml LV vol s, MOD A2C: 64.2 ml LV vol s, MOD A4C: 62.0 ml LV SV MOD A2C:     37.8 ml LV SV MOD A4C:     132.0 ml LV SV MOD BP:      54.7 ml IVC IVC diam: 2.40 cm LEFT ATRIUM         Index LA diam:    4.50 cm 2.33 cm/m  MITRAL VALVE MV Area (PHT): 2.84 cm MV Decel Time: 267 msec MV E velocity: 87.40 cm/s MV A velocity: 74.10 cm/s MV E/A ratio:  1.18 Dalton McleanMD Electronically signed by Franki Monte Signature Date/Time: 05/02/2021/6:40:45 PM    Final      Cardiac Studies   Limited Echo 05/02/2021: Impressions:  1. Left ventricular ejection fraction, by estimation, is 60 to 65%. The  left ventricle has normal function. The left ventricle has no regional  wall motion abnormalities. There is severe left ventricular hypertrophy.  Left ventricular diastolic parameters   are consistent with Grade II diastolic dysfunction (pseudonormalization).  The average left ventricular global longitudinal strain is -11.1 %. The  global longitudinal strain is abnormal, there is relative preservation of  strain at the apex (bullseye  appearance). No LV outflow tract gradient.   2. Right ventricular systolic function is mildly reduced. The right  ventricular size is normal. Mildly increased right ventricular wall  thickness. Tricuspid regurgitation signal is inadequate for assessing PA  pressure.   3. The mitral valve is normal  in structure, no systolic anterior motion.  Trivial mitral valve regurgitation. No evidence of mitral stenosis.   4. The aortic valve is tricuspid. Aortic valve regurgitation is trivial.  No aortic stenosis is present.   5. The inferior vena cava is dilated in size with >50% respiratory  variability, suggesting right atrial pressure of 8 mmHg.   6. The patient carries a diagnosis of hypertrophic cardiomyopathy.  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   Patient Profile     70 y.o. 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 Cyd Silence.   Assessment & Plan     Acute on Chronic Hypoxic Respiratory Failure  Acute on Chronic Diastolic CHF Hypertrophic Cardiomyopathy  - Patient has chronic hypoxic respiratory failure and is on 4L of O2 at home but presented with progressive dyspnea on exertion with orthopnea and intermittent exertional chest pain. - BNP elevated at 3,367.  - Chest x-ray and CT suggestive of CHF with bilateral pleural effusions as well as concern for RUL pneumonia. However, procalcitonin was low so antibiotics have not been started.  - Echo this admission showed LVEF of 60-65 with normal wall motion, severe LVH, and grade 2 diastolic dysfunction. No LVOT gradient. - Currently on IV Lasix 40mg  twice daily. No documented output so far. Creatinine  up from 2.41 yesterday to 2.78 today. - He does not appear significantly volume overloaded on exam. - He has already received morning dose of IV Lasix. We may be able to switch to PO soon. Will discuss with MD. - Monitor daily weights, strict I/Os, and renal function with diuresis.   Chest Pain Elevated Troponin - Patient has history of coronary artery calcifications on prior CT. Nuclear stress test in 2019 showed no ischemia. However, patient has never had deficitive ischemic work-up due to CKD stage IV.  He continues to report intermittent chest pain that occurs randomly - both at rest and exertion but not always brought on by exertion. - High-sensitivity troponin mildly elevated and flat at 113 >>114 >>94. Not consistent with ACS. - Echo showed normal LV function and wall motion.  - Current antianginal include: Amlodipine 10mg  daily, Coreg 25mg  twice daily, and Imdur 60mg  daily. Will increase Imdur to 90mg  daily to see if that help. - Would like to avoid cardiac catheterization given CKD stage IV.   Hypertensive Emergency - BP 232/141 on admission. Historically, BP has been very difficult to control despite multiple medications.  - Current home regimen: Amlodipine 10mg  daily, Coreg 25mg  twice  daily, Hydralazine thee times daily, and Imdur 60mg  daily. Doxazosin 2mg  daily also added this admission.  - BP was much better throughout the day yesterday. Looks like BP tends to be very elevated in the morning and improves throughout the day with medications. BP 170/75 this morning. - Will increase Imdur to 90mg  daily for additional BP and antianginal control. May also need to increase Doxazosin at night.  Hyperlipidemia - LDL 63 in 04/2020.  - Continue Lipitor 20mg  daily.  Type 2 Diabetes Mellitus - Hemoglobin A1c 5.9 on 04/03/2021. - Management per primary  team.  CKD Stage IV - Creatinine 2.4 on admission which is improved from 3.4 during hospitalization in 03/2021. Baseline appears to be around 2.5 to 3.0. - Creatinine 2.78 today. - Continue to monitor closely.  For questions or updates, please contact [place] Please consult [email or web address] for contact info under        Signed, Darreld Mclean, PA-C  05/03/2021, 7:52 AM

## 2021-05-03 NOTE — Progress Notes (Signed)
Initial Nutrition Assessment  DOCUMENTATION CODES:  Not applicable  INTERVENTION:  Add Boost Plus po TID, each supplement provides 360 kcal and 14 grams of protein.  Add MVI with minerals daily.  Encourage PO and supplement intake.  NUTRITION DIAGNOSIS:  Increased nutrient needs related to cancer and cancer related treatments as evidenced by estimated needs.  GOAL:  Patient will meet greater than or equal to 90% of their needs  MONITOR:  PO intake, Supplement acceptance, Labs, Weight trends, I & O's  REASON FOR ASSESSMENT:  Malnutrition Screening Tool    ASSESSMENT:  70 yo male with a PMH of rectal adenocarcinoma (dx CLN 03/2021), chronic hypoxic respiratory failure (4L at home), T2DM with insulin use, HLD, HTN, CKD stage 4, GERD, dCHF, HCM w/o LVOT obstruction, OSA on CPAP, and glaucoma who presented to the hospital with worsening shortness of breath and nausea. Admitted with hypertensive crisis.  RD working remotely.  Per Epic, pt's weight trending up a bit from 02/2021. Stable over the past month.  Though, of note, pt with mild generalized and BLE edema.  Pt moderately malnourished during hospital stay at the beginning of 03/2021. This is likely still the case, however, RD cannot definitively diagnose at this time.  Recommend adding Boost Plus TID and MVI with minerals daily.  Medications: reviewed; colace BID, Lasix BID, SSI, Semglee, Protonix BID, oxycodone PO PRN (given once today)  Labs: reviewed; BUN 52 (H - trending up), Crt 2.78 (H - trending up), CBG 100-172 (H) HbA1c: 5.9% (04/03/2021)  NUTRITION - FOCUSED PHYSICAL EXAM: Unable to perform - defer to follow-up  Diet Order:   Diet Order             Diet heart healthy/carb modified Room service appropriate? Yes; Fluid consistency: Thin  Diet effective now                  EDUCATION NEEDS:  No education needs have been identified at this time  Skin:  Skin Assessment: Reviewed RN Assessment  Last  BM:  05/02/21 - Type 6, medium  Height:  Ht Readings from Last 1 Encounters:  05/01/21 5\' 8"  (1.727 m)   Weight:  Wt Readings from Last 1 Encounters:  05/03/21 76.6 kg   BMI:  Body mass index is 25.68 kg/m.  Estimated Nutritional Needs:  Kcal:  2100-2300 Protein:  105-120 grams Fluid:  >2 L  Derrel Nip, RD, LDN (she/her/hers) Clinical Inpatient Dietitian RD Pager/After-Hours/Weekend Pager # in [place]

## 2021-05-03 NOTE — TOC Initial Note (Signed)
Transition of Care [facility]) - Initial/Assessment Note    Patient Details  Name: Robert Chaney MRN: [id number] Date of Birth: [birth year]  Transition of Care [facility]) CM/SW Contact:    Dessa Phi, RN Phone Number: 05/03/2021, 7:02 PM  Clinical Narrative: spoke to HTA                rep Crystal about assistance- reviewed past services-they received referral from pcp for [facility] services-HHRN/CSW/aide. HTA can provide custodial level care if needed. Noted per Caren Griffins, Social Worker with [employer]... she said that she has been working with the patient and she has concerns about his home safety and discharge planning.Marland KitchenMarland KitchenMarland [phone].TOC to contact Caren Griffins before 1pm tomorrow, leave a message.  Expected Discharge Plan: Home/Self Care Barriers to Discharge: Continued Medical Work up   Patient Goals and CMS Choice Patient states their goals for this hospitalization and ongoing recovery are:: go home CMS Medicare.gov Compare Post Acute Care list provided to:: Patient Choice offered to / list presented to : Adult Children  Expected Discharge Plan and Services Expected Discharge Plan: Home/Self Care   Discharge Planning Services: CM Consult   Living arrangements for the past 2 months: Single Family Home                                      Prior Living Arrangements/Services Living arrangements for the past 2 months: Single Family Home Lives with:: Self Patient language and need for interpreter reviewed:: Yes Do you feel safe going back to the place where you live?: Yes      Need for Family Participation in Patient Care: No (Comment) Care giver support system in place?: Yes (comment) Current home services: DME ([facility] 02) Criminal Activity/Legal Involvement Pertinent to Current Situation/Hospitalization: No - Comment as needed  Activities of Daily Living Home Assistive Devices/Equipment: CPAP, Eyeglasses, Walker (specify type) (reading glasses) ADL Screening  (condition at time of admission) Patient's cognitive ability adequate to safely complete daily activities?: Yes Is the patient deaf or have difficulty hearing?: No Does the patient have difficulty seeing, even when wearing glasses/contacts?: No Does the patient have difficulty concentrating, remembering, or making decisions?: No Patient able to express need for assistance with ADLs?: Yes Does the patient have difficulty dressing or bathing?: Yes Independently performs ADLs?: No Communication: Independent Dressing (OT): Needs assistance Is this a change from baseline?: Pre-admission baseline Grooming: Independent Feeding: Independent Bathing: Needs assistance Is this a change from baseline?: Pre-admission baseline Toileting: Needs assistance Is this a change from baseline?: Pre-admission baseline In/Out Bed: Needs assistance Is this a change from baseline?: Pre-admission baseline Walks in Home: Needs assistance Is this a change from baseline?: Pre-admission baseline Does the patient have difficulty walking or climbing stairs?: Yes Weakness of Legs: Both Weakness of Arms/Hands: None  Permission Sought/Granted Permission sought to share information with : Case Manager Permission granted to share information with : Yes, Verbal Permission Granted  Share Information with NAME: Case Manager           Emotional Assessment Appearance:: Appears stated age Attitude/Demeanor/Rapport: Gracious Affect (typically observed): Accepting Orientation: : Oriented to Self, Oriented to Place, Oriented to  Time, Oriented to Situation      Admission diagnosis:  Hypertensive crisis [I16.9] Heme positive stool [R19.5] Hypertensive urgency [I16.0] Anemia, unspecified type [D64.9] Community acquired pneumonia of right lung, unspecified part of lung [J18.9] Acute on chronic congestive heart failure, unspecified heart  failure type [facility]) [I50.9] Patient Active Problem List   Diagnosis Date Noted    Mixed hyperlipidemia 05/01/2021   Glaucoma 05/01/2021   GERD without esophagitis 05/01/2021   Rectal adenocarcinoma ([place])    Rectal bleeding    Malnutrition of moderate degree 04/03/2021   Chronic constipation    Hematochezia    Chronic posterior anal fissure    Helicobacter pylori gastritis    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[place]) 03/16/2021   Abnormal CXR 03/16/2021   Elevated brain natriuretic peptide (BNP) level 03/16/2021   Left thyroid nodule 03/16/2021   GI bleed 03/16/2021   Acute blood loss anemia 03/16/2021   Acute on chronic respiratory failure with hypoxia and hypercapnia ([place]) 02/28/2021   Melena 02/28/2021   Symptomatic anemia 02/28/2021   Syncope 01/30/2021   Chronic respiratory failure with hypoxia ([place]) 01/30/2021   Multifocal pneumonia 01/30/2021   Acute on chronic respiratory failure with hypoxia ([place]) 05/22/2020   Hypertensive crisis 02/18/2020   Type 2 diabetes mellitus with retinopathy, with long-term current use of insulin ([place]) 07/16/2019   Type 2 diabetes mellitus with stage 4 chronic kidney disease, with long-term current use of insulin ([place]) 07/16/2019   Type 2 diabetes mellitus with diabetic polyneuropathy, with long-term current use of insulin ([place]) 07/16/2019   OSA on CPAP 03/30/2019   Hyperglycemia 07/31/2017   Near syncope 07/31/2017   Acute-on-chronic kidney injury ([place]) 07/31/2017   AKI (acute kidney injury) ([place]) 06/09/2017   Hypertensive heart disease 11/07/2016   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[employer]) 07/26/2015   Stage 4 chronic kidney disease ([place]) 07/19/2011   Elevated troponin level not due myocardial infarction 07/19/2011   Hyperkalemia 07/19/2011   Volume depletion 07/19/2011   Acute on chronic diastolic CHF (congestive heart failure) ([place]) 07/19/2011   Essential hypertension 07/19/2011   Hypercholesterolemia 02/20/2011   Benign hypertensive heart disease without heart failure 02/20/2011   PCP:  [name], MD Pharmacy:    [facility] [place] [address] Phone: [phone] Fax: [phone]     Social Determinants of Health (SDOH) Interventions    Readmission Risk Interventions Readmission Risk Prevention Plan 04/11/2021 03/22/2021 03/05/2021  Transportation Screening Complete Complete Complete  PCP or Specialist Appt within 3-5 Days - - Complete  HRI or [place] - - Complete  Social Work Consult for [place] Planning/Counseling - - Complete  Palliative Care Screening - - Not Applicable  Medication Review Press photographer) Complete Complete Complete  PCP or Specialist appointment within 3-5 days of discharge Complete Complete -  [place] or Home Care Consult Complete Complete -  SW Recovery Care/Counseling Consult Complete Complete -  Palliative Care Screening Not Applicable Not Applicable -  [place] Not Applicable Not Applicable -  Some recent data might be hidden

## 2021-05-03 NOTE — Assessment & Plan Note (Signed)
-   patient has carried this diagnosis previously; repeat echo this admission showing a "global longitudinal strain". This appears to raise some consideration that underlying amyloid may be playing a factor - cardiology following; patient will have further testing likely outpt with possible PYP scan; follow up SPEP/UPEP as well

## 2021-05-04 DIAGNOSIS — I169 Hypertensive crisis, unspecified: Secondary | ICD-10-CM | POA: Diagnosis not present

## 2021-05-04 DIAGNOSIS — N179 Acute kidney failure, unspecified: Secondary | ICD-10-CM

## 2021-05-04 DIAGNOSIS — I5033 Acute on chronic diastolic (congestive) heart failure: Secondary | ICD-10-CM | POA: Diagnosis not present

## 2021-05-04 DIAGNOSIS — R778 Other specified abnormalities of plasma proteins: Secondary | ICD-10-CM | POA: Diagnosis not present

## 2021-05-04 DIAGNOSIS — N184 Chronic kidney disease, stage 4 (severe): Secondary | ICD-10-CM | POA: Diagnosis not present

## 2021-05-04 DIAGNOSIS — C20 Malignant neoplasm of rectum: Secondary | ICD-10-CM | POA: Diagnosis not present

## 2021-05-04 LAB — CBC WITH DIFFERENTIAL/PLATELET
Abs Immature Granulocytes: 0.01 10*3/uL (ref 0.00–0.07)
Basophils Absolute: 0 10*3/uL (ref 0.0–0.1)
Basophils Relative: 0 %
Eosinophils Absolute: 0 10*3/uL (ref 0.0–0.5)
Eosinophils Relative: 1 %
HCT: 27.5 % — ABNORMAL LOW (ref 39.0–52.0)
Hemoglobin: 8.4 g/dL — ABNORMAL LOW (ref 13.0–17.0)
Immature Granulocytes: 0 %
Lymphocytes Relative: 16 %
Lymphs Abs: 0.6 10*3/uL — ABNORMAL LOW (ref 0.7–4.0)
MCH: 29.9 pg (ref 26.0–34.0)
MCHC: 30.5 g/dL (ref 30.0–36.0)
MCV: 97.9 fL (ref 80.0–100.0)
Monocytes Absolute: 0.4 10*3/uL (ref 0.1–1.0)
Monocytes Relative: 10 %
Neutro Abs: 2.6 10*3/uL (ref 1.7–7.7)
Neutrophils Relative %: 73 %
Platelets: 153 10*3/uL (ref 150–400)
RBC: 2.81 MIL/uL — ABNORMAL LOW (ref 4.22–5.81)
RDW: 18.3 % — ABNORMAL HIGH (ref 11.5–15.5)
WBC: 3.7 10*3/uL — ABNORMAL LOW (ref 4.0–10.5)
nRBC: 0 % (ref 0.0–0.2)

## 2021-05-04 LAB — GLUCOSE, CAPILLARY
Glucose-Capillary: 142 mg/dL — ABNORMAL HIGH (ref 70–99)
Glucose-Capillary: 168 mg/dL — ABNORMAL HIGH (ref 70–99)
Glucose-Capillary: 122 mg/dL — ABNORMAL HIGH (ref 70–99)
Glucose-Capillary: 110 mg/dL — ABNORMAL HIGH (ref 70–99)

## 2021-05-04 LAB — BASIC METABOLIC PANEL
Anion gap: 7 (ref 5–15)
BUN: 55 mg/dL — ABNORMAL HIGH (ref 8–23)
CO2: 25 mmol/L (ref 22–32)
Calcium: 8.1 mg/dL — ABNORMAL LOW (ref 8.9–10.3)
Chloride: 105 mmol/L (ref 98–111)
Creatinine, Ser: 3.23 mg/dL — ABNORMAL HIGH (ref 0.61–1.24)
GFR, Estimated: 20 mL/min — ABNORMAL LOW (ref >60)
Glucose, Bld: 206 mg/dL — ABNORMAL HIGH (ref 70–99)
Potassium: 4.3 mmol/L (ref 3.5–5.1)
Sodium: 137 mmol/L (ref 135–145)

## 2021-05-04 LAB — MAGNESIUM: Magnesium: 2 mg/dL (ref 1.7–2.4)

## 2021-05-04 MED ORDER — OXYCODONE HCL 5 MG PO TABS
5.0000 mg | ORAL_TABLET | ORAL | Status: AC | PRN
  Administered 2021-05-04 (×2): 5 mg via ORAL
  Filled 2021-05-04 (×2): qty 1

## 2021-05-04 MED ORDER — BUTAMBEN-TETRACAINE-BENZOCAINE 2-2-14 % EX AERO
1.0000 | INHALATION_SPRAY | CUTANEOUS | Status: DC | PRN
  Administered 2021-05-06 (×4): 1 via TOPICAL
  Filled 2021-05-04 (×2): qty 20

## 2021-05-04 MED ORDER — OXYCODONE HCL 5 MG PO TABS
5.0000 mg | ORAL_TABLET | ORAL | Status: DC | PRN
  Administered 2021-05-04 – 2021-05-10 (×16): 5 mg via ORAL
  Filled 2021-05-04 (×17): qty 1

## 2021-05-04 MED ORDER — LIDOCAINE 5 % EX OINT
TOPICAL_OINTMENT | CUTANEOUS | Status: DC | PRN
  Filled 2021-05-04 (×2): qty 35.44

## 2021-05-04 NOTE — Progress Notes (Signed)
Checked with patient multiple times this shift for nocturnal CPAP. He states "I will let you know when I am ready". RN aware.

## 2021-05-04 NOTE — Progress Notes (Signed)
Progress Note  Patient Name: Robert Chaney Date of Encounter: 05/04/2021  [facility] Cardiologist: Skeet Latch, MD   Subjective   No acute overnight events. Patient states he is overall feeling better today. He does not feel like he urinated much overnight but he states his breathing is better and he feels like he is close to his baseline. Currently on 3L of O2 via nasal cannula and typically on 4L at home. He continues to have some intermittent chest pain but states the increase in Imdur has helped.  Inpatient Medications    Scheduled Meds:  amLODipine  10 mg Oral Daily   atorvastatin  20 mg Oral Daily   brimonidine  1 drop Right Eye BID   carvedilol  25 mg Oral BID WC   docusate sodium  200 mg Oral BID   dorzolamide  1 drop Both Eyes BID   doxazosin  2 mg Oral QHS   enoxaparin (LOVENOX) injection  30 mg Subcutaneous Q24H   furosemide  80 mg Intravenous BID   hydrALAZINE  100 mg Oral Q8H   insulin aspart  0-15 Units Subcutaneous TID AC & HS   insulin glargine-yfgn  10 Units Subcutaneous Daily   isosorbide mononitrate  90 mg Oral Daily   lactose free nutrition  237 mL Oral TID WC   latanoprost  1 drop Left Eye QHS   multivitamin with minerals  1 tablet Oral Daily   pantoprazole  40 mg Oral BID AC   Continuous Infusions:  PRN Meds: acetaminophen **OR** acetaminophen, albuterol, enalaprilat, nitroGLYCERIN, ondansetron **OR** ondansetron (ZOFRAN) IV, oxyCODONE, polyethylene glycol   Vital Signs    Vitals:   05/03/21 2114 05/04/21 0330 05/04/21 0334 05/04/21 0641  BP: 128/64 (!) 117/46 (!) 132/53 133/68  Pulse: 60 62 (!) 59 (!) 55  Resp: 18 (!) 21 (!) 21   Temp: 97.8 F (36.6 C) 97.8 F (36.6 C) 97.8 F (36.6 C)   TempSrc: Oral     SpO2: 96% (!) 73% 92%   Weight:      Height:        Intake/Output Summary (Last 24 hours) at 05/04/2021 0729 Last data filed at 05/04/2021 0400 Gross per 24 hour  Intake 470 ml  Output 950 ml  Net -480 ml   Last 3  Weights 05/03/2021 05/02/2021 05/01/2021  Weight (lbs) 168 lb 14 oz 168 lb 10.4 oz 175 lb  Weight (kg) 76.6 kg 76.5 kg 79.379 kg      Telemetry    Sinus rhythm with rates in the 50s to 60s. - Personally Reviewed  ECG    No new ECG tracing today. - Personally Reviewed  Physical Exam   GEN: No acute distress. On 3L of O2 via nasal cannula. Neck: Positive hepatojugular reflux. Cardiac: RRR. No murmurs, rubs, or gallops.  Respiratory: No increased work of breathing. Faint wheezing note in right base but no rhonchi or rales. GI: Soft, non-distended, and non-tender.  MS: Trace lower extremity edema bilaterally. No deformity. Skin: Warm and dry. Neuro:  No focal deficits. Psych: Normal affect. Responds appropriately.   Labs    High Sensitivity Troponin:   Recent Labs  Lab 04/08/21 0338 05/01/21 1713 05/01/21 2019 05/02/21 0510  TROPONINIHS 82* 113* 114* 94*     Chemistry Recent Labs  Lab 05/01/21 1713 05/01/21 1832 05/01/21 2359 05/02/21 0208 05/03/21 0434 05/04/21 0429  NA 141   < > 140  --  140 137  K 4.9   < > 4.6  --  4.1 4.3  CL 108   < > 110  --  107 105  CO2 28  --  26  --  26 25  GLUCOSE 128*   < > 106*  --  105* 206*  BUN 41*   < > 40*  --  52* 55*  CREATININE 2.41*   < > 2.40*  --  2.78* 3.23*  CALCIUM 9.4  --  8.8*  --  8.5* 8.1*  MG  --   --   --  2.0 1.9 2.0  PROT 8.2*  --  6.9  --   --   --   ALBUMIN 3.4*  --  2.7*  --   --   --   AST 21  --  18  --   --   --   ALT 26  --  21  --   --   --   ALKPHOS 106  --  86  --   --   --   BILITOT 0.8  --  0.7  --   --   --   GFRNONAA 28*  --  28*  --  23* 20*  ANIONGAP 5  --  4*  --  7 7   < > = values in this interval not displayed.    Lipids No results for input(s): CHOL, TRIG, HDL, LABVLDL, LDLCALC, CHOLHDL in the last 168 hours.  Hematology Recent Labs  Lab 05/02/21 0208 05/03/21 0434 05/04/21 0429  WBC 4.1 3.1* 3.7*  RBC 3.10* 2.80* 2.81*  HGB 9.4* 8.4* 8.4*  HCT 30.7* 27.9* 27.5*  MCV 99.0  99.6 97.9  MCH 30.3 30.0 29.9  MCHC 30.6 30.1 30.5  RDW 18.2* 18.3* 18.3*  PLT 151 174 153   Thyroid No results for input(s): TSH, FREET4 in the last 168 hours.  BNP Recent Labs  Lab 05/01/21 2019  BNP 3,367.1*    DDimer No results for input(s): DDIMER in the last 168 hours.   Radiology    ECHOCARDIOGRAM LIMITED  Result Date: 05/02/2021    ECHOCARDIOGRAM LIMITED REPORT   Patient Name:   Robert Chaney Date of Exam: 05/02/2021 Medical Rec #:  [id number]     Height:       68.0 in Accession #:    [id number]    Weight:       175.0 lb Date of Birth:  [birth year]      BSA:          1.931 m Patient Age:    70 years      BP:           141/73 mmHg Patient Gender: M             HR:           62 bpm. Exam Location:  Inpatient Procedure: Limited Echo, Cardiac Doppler and Limited Color Doppler Indications:    CHF, HCM  History:        Patient has prior history of Echocardiogram examinations, most                 recent 03/19/2021. CHF and Hypertrophic Cardiomyopathy,                 Signs/Symptoms:Elevated Troponin; Risk Factors:Diabetes,                 Hypertension and HLD.  Sonographer:    Glo Herring Referring Phys: [id number] [place]  1. Left ventricular ejection fraction, by estimation,  is 60 to 65%. The left ventricle has normal function. The left ventricle has no regional wall motion abnormalities. There is severe left ventricular hypertrophy. Left ventricular diastolic parameters  are consistent with Grade II diastolic dysfunction (pseudonormalization). The average left ventricular global longitudinal strain is -11.1 %. The global longitudinal strain is abnormal, there is relative preservation of strain at the apex (bullseye appearance). No LV outflow tract gradient.  2. Right ventricular systolic function is mildly reduced. The right ventricular size is normal. Mildly increased right ventricular wall thickness. Tricuspid regurgitation signal is inadequate for assessing PA pressure.   3. The mitral valve is normal in structure, no systolic anterior motion. Trivial mitral valve regurgitation. No evidence of mitral stenosis.  4. The aortic valve is tricuspid. Aortic valve regurgitation is trivial. No aortic stenosis is present.  5. The inferior vena cava is dilated in size with >50% respiratory variability, suggesting right atrial pressure of 8 mmHg.  6. The patient carries a diagnosis of hypertrophic cardiomyopathy. 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 FINDINGS  Left Ventricle: Left ventricular ejection fraction, by estimation, is 60 to 65%. The left ventricle has normal function. The left ventricle has no regional wall motion abnormalities. The average left ventricular global longitudinal strain is -11.1 %. The global longitudinal strain is abnormal. The left ventricular internal cavity size was normal in size. There is severe left ventricular hypertrophy. Left ventricular diastolic parameters are consistent with Grade II diastolic dysfunction (pseudonormalization). Right Ventricle: The right ventricular size is normal. Mildly increased right ventricular wall thickness. Right ventricular systolic function is mildly reduced. Tricuspid regurgitation signal is inadequate for assessing PA pressure. Pericardium: There is no evidence of pericardial effusion. Mitral Valve: The mitral valve is normal in structure. Trivial mitral valve regurgitation. No evidence of mitral valve stenosis. Tricuspid Valve: The tricuspid valve is normal in structure. Tricuspid valve regurgitation is not demonstrated. Aortic Valve: The aortic valve is tricuspid. Aortic valve regurgitation is trivial. No aortic stenosis is present. Pulmonic Valve: The pulmonic valve was normal in structure. Pulmonic valve regurgitation is not visualized. Venous: The inferior vena cava is dilated in size with greater than 50% respiratory  variability, suggesting right atrial pressure of 8 mmHg. LEFT VENTRICLE PLAX 2D LVIDd:         4.00 cm      Diastology LVIDs:         2.90 cm      LV e' medial:    5.22 cm/s LV PW:         1.80 cm      LV E/e' medial:  16.7 LV IVS:        1.80 cm      LV e' lateral:   5.55 cm/s                             LV E/e' lateral: 15.7  LV Volumes (MOD)            2D Longitudinal Strain LV vol d, MOD A2C: 102.0 ml 2D Strain GLS Avg:     -11.1 % LV vol d, MOD A4C: 132.0 ml LV vol s, MOD A2C: 64.2 ml LV vol s, MOD A4C: 62.0 ml LV SV MOD A2C:     37.8 ml LV SV MOD A4C:     132.0 ml LV SV MOD BP:  54.7 ml IVC IVC diam: 2.40 cm LEFT ATRIUM         Index LA diam:    4.50 cm 2.33 cm/m  MITRAL VALVE MV Area (PHT): 2.84 cm MV Decel Time: 267 msec MV E velocity: 87.40 cm/s MV A velocity: 74.10 cm/s MV E/A ratio:  1.18 Dalton McleanMD Electronically signed by Franki Monte Signature Date/Time: 05/02/2021/6:40:45 PM    Final     Cardiac Studies   Limited Echo 05/02/2021: Impressions:  1. Left ventricular ejection fraction, by estimation, is 60 to 65%. The  left ventricle has normal function. The left ventricle has no regional  wall motion abnormalities. There is severe left ventricular hypertrophy.  Left ventricular diastolic parameters   are consistent with Grade II diastolic dysfunction (pseudonormalization).  The average left ventricular global longitudinal strain is -11.1 %. The  global longitudinal strain is abnormal, there is relative preservation of  strain at the apex (bullseye  appearance). No LV outflow tract gradient.   2. Right ventricular systolic function is mildly reduced. The right  ventricular size is normal. Mildly increased right ventricular wall  thickness. Tricuspid regurgitation signal is inadequate for assessing PA  pressure.   3. The mitral valve is normal in structure, no systolic anterior motion.  Trivial mitral valve regurgitation. No evidence of mitral stenosis.   4. The aortic  valve is tricuspid. Aortic valve regurgitation is trivial.  No aortic stenosis is present.   5. The inferior vena cava is dilated in size with >50% respiratory  variability, suggesting right atrial pressure of 8 mmHg.   6. The patient carries a diagnosis of hypertrophic cardiomyopathy.  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   Patient Profile     70 y.o. 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 Cyd Silence.   Assessment & Plan    Acute on Chronic Hypoxic Respiratory Failure  Acute on Chronic Diastolic CHF Hypertrophic Cardiomyopathy  - Patient has chronic hypoxic respiratory failure and is on 4L of O2 at home but presented with progressive dyspnea on exertion with orthopnea and intermittent exertional chest pain. - BNP elevated at 3,367.  - Chest x-ray and CT suggestive of CHF with bilateral pleural effusions as well as concern for RUL pneumonia. However, procalcitonin was low so antibiotics have not been started.  - Echo this admission showed LVEF of 60-65 with normal wall motion, severe LVH, and grade 2 diastolic dysfunction. No LVOT gradient. Severe concentric LV and mild RVH as well as apical sparing strain pattern felt to be more suggestive of cardiac amyloidosis rather than hypertrophic cardiomyopathy. SPEP/UPEP pending. May need to consider PYP scan. - IV Lasix increased to 80mg  twice daily yesterday. Only 950 cc of documented urine output this admission. Creatinine continues to climb.  - He does not appear  significantly volume overloaded on exam.  - Will discuss plan for additional diuresis with MD. - Monitor daily weights, strict I/Os, and renal function with diuresis.    Chest Pain Elevated Troponin - Patient has history of coronary artery calcifications on prior CT. Nuclear stress test  in 2019 showed no ischemia. However, patient has never had deficitive ischemic work-up due to CKD stage IV.  Patient reports intermittent chest pain that occurs randomly - both at rest and exertion but not always brought on by exertion.  - High-sensitivity troponin mildly elevated and flat at 113 >>114 >>94. Not consistent with ACS. - Echo showed normal LV function and wall motion.  - Current home antianginal regimen: Amlodipine 10mg  daily, Coreg 25mg  twice daily, and Imdur 60mg  daily. Imdur was increased to 90mg  on 11/10 and patient report improvement in chest pain some. Continue current regimen. - Would like to avoid cardiac catheterization given CKD stage IV.    Hypertensive Emergency - BP 232/141 on admission. Historically, BP has been very difficult to control despite multiple medications.  - Current home regimen: Amlodipine 10mg  daily, Coreg 25mg  twice daily, Hydralazine thee times daily, and Imdur 60mg  daily. Doxazosin 2mg  daily was added this admission and Imdur was increased to 90mg  daily. - BP looks much better today. - Continue current regimen.   Hyperlipidemia - LDL 63 in 04/2020.  - Continue Lipitor 20mg  daily.   Type 2 Diabetes Mellitus - Hemoglobin A1c 5.9 on 04/03/2021. - Management per primary team.   CKD Stage IV - Creatinine increasing with diuresis:  2.41 >> 2.78 >> 3.23. Baseline around 2.5 to 3.0.  - Continue to monitor closely. - May need to get Nephrology involved to assist with diuresis.  Otherwise, per primary team: - Rectal cancer - GERD - Glaucoma - Chronic hypoxic respiratory failure on 4L at home  For questions or updates, please contact [facility] Please consult  [email or web address] for contact info under        Signed, Darreld Mclean, PA-C  05/04/2021, 7:29 AM

## 2021-05-04 NOTE — Consult Note (Signed)
[place]  Reason for Consultation: AKI on CKD Requesting Provider: Dr. Sabino Gasser  HPI: ABDURAHMAN RUGG is an 70 y.o. male with CKD 4, DM, HFpEF, chronic hypoxic resp failure (on 4L [place] chronically), HTN, HL, OSA, rectal adenocarcinoma (dx 03/2021, stage IIa; XRT, not surgical candidate) who is seen for evaluation and management of AKI on CKD.   Admitted mid 03/2021 with CHF, HTN urgency, anemia.  Ultimately dx with rectal adenoca and started XRT as not surgical candidate and no appropriate tolerable chemo treatments.    Presented [facility] ED from cancer center (palliative med visit) 11/8 with weakness, HTN, dyspnea and nausea of several days duration.  BP initially 220s/100s.  Initial CXR with CHF and RUL PNA, redemonstrated on CT chest 11/9.  He's been treated with IV and oral anti HTN,  IV diuretics - initially 40 BID then 80 IV BID dosed last PM and 8am today. I/Os showing ~ net even for the admission. But wt down from 79.4kg to 76.6kg.  BPs   Baseline Cr recently in the 2.4-3+ range, was in mid to high 2s dating back over a year ago.  On admission Cr was 2.4 and has trended to 2.8 yesterday and 3.2 today.  UA 11/8 with 300 protein (priors 30-100), small bld (0-5 on microscopy) o/w bland.  04/08/21 MRI without renal issues.   Says 17lbs of wt loss in past 2-3 months.  Off/on edema and orthopnea for years.  No LUTs, no voiding difficulties. + constipation and pain with passing BM. No NSAIDs.   PMH: Past Medical History:  Diagnosis Date   Altered mental status    a. 05/2017 - adm with blurred vision, somnolence in setting of AKI and high blood sugar.   Anemia    CKD (chronic kidney disease), stage III (HCC)    Diabetes mellitus    Diastolic CHF, chronic ([place])    A.  03/2009 Echo: EF 60-65%, Gr II diast dysfxn   Elevated troponin    a. 2013 - troponin 1.4. b. 2019 - troponin 0.32; neg nuc 07/2017.   Glaucoma    Hyperlipidemia    HYPERCHOLESTEROLEMIA    Hypertension    MARKED LEFT VENTRICULAR HYPERTROPHY BY PREVIOUS ECHOCARDIOGRAM--HE HAS HYPERDYNAMIC LEFT VENTRICULAR SYSTOLIC FUNCTION AND HAS IMPAIRED RELAXATION BY ECHO   Hypertrophic cardiomyopathy (HCC)    NSVT (nonsustained ventricular tachycardia)    Premature atrial contractions    PVC's (premature ventricular contractions)    SVT (supraventricular tachycardia) (HCC)    PSH: Past Surgical History:  Procedure Laterality Date   BIOPSY  03/03/2021   Procedure: BIOPSY;  Surgeon: Jackquline Denmark, MD;  Location: [place];  Service: Endoscopy;;   BIOPSY  04/06/2021   Procedure: BIOPSY;  Surgeon: [name], MD;  Location: [place];  Service: Gastroenterology;;   BRONCHIAL BIOPSY  03/05/2021   Procedure: BRONCHIAL BIOPSIES;  Surgeon: Candee Furbish, MD;  Location[facility] ENDOSCOPY;  Service: Pulmonary;;   BRONCHIAL BRUSHINGS  03/05/2021   Procedure: BRONCHIAL BRUSHINGS;  Surgeon: Candee Furbish, MD;  Location: [place];  Service: Pulmonary;;   BRONCHIAL WASHINGS  03/05/2021   Procedure: BRONCHIAL WASHINGS;  Surgeon: Candee Furbish, MD;  Location: [facility] ENDOSCOPY;  Service: Pulmonary;;   COLONOSCOPY WITH PROPOFOL N/A 04/06/2021   Procedure: COLONOSCOPY WITH PROPOFOL;  Surgeon: [name], MD;  Location: [place];  Service: Gastroenterology;  Laterality: N/A;   ESOPHAGOGASTRODUODENOSCOPY (EGD) WITH PROPOFOL N/A 03/03/2021   Procedure: ESOPHAGOGASTRODUODENOSCOPY (EGD) WITH PROPOFOL;  Surgeon: Lyndel Safe,  Peyton Bottoms, MD;  Location: [place];  Service: Endoscopy;  Laterality: N/A;   HEMOSTASIS CONTROL  03/05/2021   Procedure: HEMOSTASIS CONTROL;  Surgeon: Candee Furbish, MD;  Location: [facility] ENDOSCOPY;  Service: Pulmonary;;   NO PAST SURGERIES     POLYPECTOMY  04/06/2021   Procedure: POLYPECTOMY;  Surgeon: [name], MD;  Location: [place];  Service: Gastroenterology;;   VIDEO BRONCHOSCOPY N/A 03/05/2021   Procedure: VIDEO BRONCHOSCOPY WITH FLUORO;  Surgeon: Candee Furbish,  MD;  Location: [facility] ENDOSCOPY;  Service: Pulmonary;  Laterality: N/A;    Past Medical History:  Diagnosis Date   Altered mental status    a. 05/2017 - adm with blurred vision, somnolence in setting of AKI and high blood sugar.   Anemia    CKD (chronic kidney disease), stage III (HCC)    Diabetes mellitus    Diastolic CHF, chronic ([place])    A.  03/2009 Echo: EF 60-65%, Gr II diast dysfxn   Elevated troponin    a. 2013 - troponin 1.4. b. 2019 - troponin 0.32; neg nuc 07/2017.   Glaucoma    Hyperlipidemia    HYPERCHOLESTEROLEMIA   Hypertension    MARKED LEFT VENTRICULAR HYPERTROPHY BY PREVIOUS ECHOCARDIOGRAM--HE HAS HYPERDYNAMIC LEFT VENTRICULAR SYSTOLIC FUNCTION AND HAS IMPAIRED RELAXATION BY ECHO   Hypertrophic cardiomyopathy (HCC)    NSVT (nonsustained ventricular tachycardia)    Premature atrial contractions    PVC's (premature ventricular contractions)    SVT (supraventricular tachycardia) (HCC)     Medications:  I have reviewed the patient's current medications. Amlodipine 10 coreg 25 BID cardura 2 vasotec 0.625 q6h prn, hydralazine 100 TID, imdur 90   Medications Prior to Admission  Medication Sig Dispense Refill   amLODipine (NORVASC) 10 MG tablet Take 1 tablet (10 mg total) by mouth daily. 30 tablet 1   atorvastatin (LIPITOR) 20 MG tablet Take 1 tablet (20 mg total) by mouth daily. 90 tablet 2   brimonidine (ALPHAGAN) 0.2 % ophthalmic solution Place 1 drop into the right eye 2 (two) times daily.     carvedilol (COREG) 25 MG tablet Take 1 tablet (25 mg total) by mouth 2 (two) times daily. 60 tablet 0   cholecalciferol (VITAMIN D3) 25 MCG (1000 UT) tablet Take 1,000 Units by mouth daily.     docusate sodium (COLACE) 100 MG capsule Take 2 capsules (200 mg total) by mouth 2 (two) times daily. 60 capsule 0   dorzolamide (TRUSOPT) 2 % ophthalmic solution Place 1 drop into both eyes 2 (two) times daily.     furosemide (LASIX) 40 MG tablet Take 1 tablet (40 mg total) by mouth 2 (two)  times daily. 60 tablet 1   hydrALAZINE (APRESOLINE) 100 MG tablet Take 1 tablet (100 mg total) by mouth 3 (three) times daily. 270 tablet 3   insulin aspart (NOVOLOG) 100 UNIT/ML injection Inject 3-6 Units into the skin 3 (three) times daily before meals.     insulin glargine (LANTUS SOLOSTAR) 100 UNIT/ML Solostar Pen Inject 10 Units into the skin daily. 15 mL 11   isosorbide mononitrate (IMDUR) 60 MG 24 hr tablet Take 60 mg by mouth daily.     latanoprost (XALATAN) 0.005 % ophthalmic solution Place 1 drop into the left eye at bedtime.     nitroGLYCERIN (NITROSTAT) 0.4 MG SL tablet DISSOLVE ONE TABLET UNDER THE TONGUE EVERY 5 MINUTES AS NEEDED FOR CHEST PAIN. (Patient taking differently: Place 0.4 mg under the tongue every 5 (five) minutes as needed for chest pain.)  25 tablet 7   pantoprazole (PROTONIX) 40 MG tablet Take 1 tablet (40 mg total) by mouth 2 (two) times daily before a meal. 60 tablet 2   Continuous Blood Gluc Sensor (DEXCOM G6 SENSOR) MISC 1 Device by Does not apply route as directed. 9 each 3   Continuous Blood Gluc Transmit (DEXCOM G6 TRANSMITTER) MISC 1 Device by Does not apply route as directed. 1 each 3   glucose blood (ONETOUCH VERIO) test strip USE 1 STRIP TO CHECK GLUCOSE THREE TIMES DAILY AS DIRECTED 100 each 0   Insulin Pen Needle 32G X 4 MM MISC Use as directed with insulin 100 each 0   oxyCODONE 7.5 MG TABS Take 7.5 mg by mouth every 4 (four) hours as needed for moderate pain. (Patient not taking: No sig reported) 30 tablet 0   polyethylene glycol (MIRALAX) 17 g packet Take 17 g by mouth daily. 28 each 0    ALLERGIES:  No Known Allergies  FAM HX: Family History  Problem Relation Age of Onset   Hypertension Father    Stroke Father    Hypertension Mother    Diabetes Brother    Hypertension Brother    Diabetes Brother     Social History:   reports that he has never smoked. He has never used smokeless tobacco. He reports that he does not drink alcohol and does not  use drugs.  ROS: 12 system ROS neg except per HPI  Blood pressure 133/68, pulse (!) 55, temperature 97.8 F (36.6 C), resp. rate (!) 21, height 5\' 8"  (1.727 m), weight 76.6 kg, SpO2 92 %. PHYSICAL EXAM: Gen: sitting comfortably in bedside chair  Eyes: anicteric, EOMI ENT: MMM Neck: supple, cannot appreciate JVD CV:  brady, regular, no rub Abd: soft, nontender Lungs: clear to bases GU: no foley Extr:  chronic woody texture, 1+ tibial edema Neuro: grossly nonfocal   Results for orders placed or performed during the hospital encounter of 05/01/21 (from the past 48 hour(s))  CBG monitoring, ED     Status: Abnormal   Collection Time: 05/02/21 11:59 AM  Result Value Ref Range   Glucose-Capillary 155 (H) 70 - 99 mg/dL    Comment: Glucose reference range applies only to samples taken after fasting for at least 8 hours.  Glucose, capillary     Status: Abnormal   Collection Time: 05/02/21  6:37 PM  Result Value Ref Range   Glucose-Capillary 172 (H) 70 - 99 mg/dL    Comment: Glucose reference range applies only to samples taken after fasting for at least 8 hours.  Glucose, capillary     Status: Abnormal   Collection Time: 05/02/21  9:41 PM  Result Value Ref Range   Glucose-Capillary 100 (H) 70 - 99 mg/dL    Comment: Glucose reference range applies only to samples taken after fasting for at least 8 hours.  Basic metabolic panel     Status: Abnormal   Collection Time: 05/03/21  4:34 AM  Result Value Ref Range   Sodium 140 135 - 145 mmol/L   Potassium 4.1 3.5 - 5.1 mmol/L   Chloride 107 98 - 111 mmol/L   CO2 26 22 - 32 mmol/L   Glucose, Bld 105 (H) 70 - 99 mg/dL    Comment: Glucose reference range applies only to samples taken after fasting for at least 8 hours.   BUN 52 (H) 8 - 23 mg/dL   Creatinine, Ser 2.78 (H) 0.61 - 1.24 mg/dL   Calcium 8.5 (L) 8.9 -  10.3 mg/dL   GFR, Estimated 23 (L) >60 mL/min    Comment: (NOTE) Calculated using the CKD-EPI Creatinine Equation (2021)     Anion gap 7 5 - 15    Comment: Performed at [facility], [place] [address] [place], [place] [address]  CBC with Differential/Platelet     Status: Abnormal   Collection Time: 05/03/21  4:34 AM  Result Value Ref Range   WBC 3.1 (L) 4.0 - 10.5 K/uL   RBC 2.80 (L) 4.22 - 5.81 MIL/uL   Hemoglobin 8.4 (L) 13.0 - 17.0 g/dL   HCT 27.9 (L) 39.0 - 52.0 %   MCV 99.6 80.0 - 100.0 fL   MCH 30.0 26.0 - 34.0 pg   MCHC 30.1 30.0 - 36.0 g/dL   RDW 18.3 (H) 11.5 - 15.5 %   Platelets 174 150 - 400 K/uL   nRBC 0.0 0.0 - 0.2 %   Neutrophils Relative % 61 %   Neutro Abs 1.9 1.7 - 7.7 K/uL   Lymphocytes Relative 24 %   Lymphs Abs 0.8 0.7 - 4.0 K/uL   Monocytes Relative 13 %   Monocytes Absolute 0.4 0.1 - 1.0 K/uL   Eosinophils Relative 2 %   Eosinophils Absolute 0.1 0.0 - 0.5 K/uL   Basophils Relative 0 %   Basophils Absolute 0.0 0.0 - 0.1 K/uL   Immature Granulocytes 0 %   Abs Immature Granulocytes 0.01 0.00 - 0.07 K/uL    Comment: Performed at [facility], [place] [address], [place], [place] [address]  Magnesium     Status: None   Collection Time: 05/03/21  4:34 AM  Result Value Ref Range   Magnesium 1.9 1.7 - 2.4 mg/dL    Comment: Performed at [facility], [place] [address] [place], [place] [address]  Glucose, capillary     Status: Abnormal   Collection Time: 05/03/21  7:36 AM  Result Value Ref Range   Glucose-Capillary 155 (H) 70 - 99 mg/dL    Comment: Glucose reference range applies only to samples taken after fasting for at least 8 hours.  Glucose, capillary     Status: Abnormal   Collection Time: 05/03/21 11:31 AM  Result Value Ref Range   Glucose-Capillary 134 (H) 70 - 99 mg/dL    Comment: Glucose reference range applies only to samples taken after fasting for at least 8 hours.  Glucose, capillary     Status: Abnormal   Collection Time: 05/03/21  5:18 PM  Result Value Ref Range   Glucose-Capillary 130 (H) 70 - 99 mg/dL    Comment:  Glucose reference range applies only to samples taken after fasting for at least 8 hours.  Glucose, capillary     Status: None   Collection Time: 05/03/21  8:43 PM  Result Value Ref Range   Glucose-Capillary 91 70 - 99 mg/dL    Comment: Glucose reference range applies only to samples taken after fasting for at least 8 hours.  Basic metabolic panel     Status: Abnormal   Collection Time: 05/04/21  4:29 AM  Result Value Ref Range   Sodium 137 135 - 145 mmol/L   Potassium 4.3 3.5 - 5.1 mmol/L   Chloride 105 98 - 111 mmol/L   CO2 25 22 - 32 mmol/L   Glucose, Bld 206 (H) 70 - 99 mg/dL    Comment: Glucose reference range applies only to samples taken after fasting for at least 8 hours.   BUN 55 (H)  8 - 23 mg/dL   Creatinine, Ser 3.23 (H) 0.61 - 1.24 mg/dL   Calcium 8.1 (L) 8.9 - 10.3 mg/dL   GFR, Estimated 20 (L) >60 mL/min    Comment: (NOTE) Calculated using the CKD-EPI Creatinine Equation (2021)    Anion gap 7 5 - 15    Comment: Performed at [facility], [place] [address] [place], [place] [address]  CBC with Differential/Platelet     Status: Abnormal   Collection Time: 05/04/21  4:29 AM  Result Value Ref Range   WBC 3.7 (L) 4.0 - 10.5 K/uL   RBC 2.81 (L) 4.22 - 5.81 MIL/uL   Hemoglobin 8.4 (L) 13.0 - 17.0 g/dL   HCT 27.5 (L) 39.0 - 52.0 %   MCV 97.9 80.0 - 100.0 fL   MCH 29.9 26.0 - 34.0 pg   MCHC 30.5 30.0 - 36.0 g/dL   RDW 18.3 (H) 11.5 - 15.5 %   Platelets 153 150 - 400 K/uL   nRBC 0.0 0.0 - 0.2 %   Neutrophils Relative % 73 %   Neutro Abs 2.6 1.7 - 7.7 K/uL   Lymphocytes Relative 16 %   Lymphs Abs 0.6 (L) 0.7 - 4.0 K/uL   Monocytes Relative 10 %   Monocytes Absolute 0.4 0.1 - 1.0 K/uL   Eosinophils Relative 1 %   Eosinophils Absolute 0.0 0.0 - 0.5 K/uL   Basophils Relative 0 %   Basophils Absolute 0.0 0.0 - 0.1 K/uL   Immature Granulocytes 0 %   Abs Immature Granulocytes 0.01 0.00 - 0.07 K/uL    Comment: Performed at [facility],  [place] [address], [place], [place] [address]  Magnesium     Status: None   Collection Time: 05/04/21  4:29 AM  Result Value Ref Range   Magnesium 2.0 1.7 - 2.4 mg/dL    Comment: Performed at [facility], [place] [address], [place], [place] [address]  Glucose, capillary     Status: Abnormal   Collection Time: 05/04/21  7:25 AM  Result Value Ref Range   Glucose-Capillary 142 (H) 70 - 99 mg/dL    Comment: Glucose reference range applies only to samples taken after fasting for at least 8 hours.    ECHOCARDIOGRAM LIMITED  Result Date: 05/02/2021    ECHOCARDIOGRAM LIMITED REPORT   Patient Name:   GUERRY COVINGTON Date of Exam: 05/02/2021 Medical Rec #:  [id number]     Height:       68.0 in Accession #:    [id number]    Weight:       175.0 lb Date of Birth:  [birth year]      BSA:          1.931 m Patient Age:    37 years      BP:           141/73 mmHg Patient Gender: M             HR:           62 bpm. Exam Location:  Inpatient Procedure: Limited Echo, Cardiac Doppler and Limited Color Doppler Indications:    CHF, HCM  History:        Patient has prior history of Echocardiogram examinations, most                 recent 03/19/2021. CHF and Hypertrophic Cardiomyopathy,                 Signs/Symptoms:Elevated Troponin; Risk  Factors:Diabetes,                 Hypertension and HLD.  Sonographer:    Glo Herring Referring Phys: [id number] [place]  1. Left ventricular ejection fraction, by estimation, is 60 to 65%. The left ventricle has normal function. The left ventricle has no regional wall motion abnormalities. There is severe left ventricular hypertrophy. Left ventricular diastolic parameters  are consistent with Grade II diastolic dysfunction (pseudonormalization). The average left ventricular global longitudinal strain is -11.1 %. The global longitudinal strain is abnormal, there is relative preservation of strain at the apex (bullseye appearance). No LV outflow tract gradient.   2. Right ventricular systolic function is mildly reduced. The right ventricular size is normal. Mildly increased right ventricular wall thickness. Tricuspid regurgitation signal is inadequate for assessing PA pressure.  3. The mitral valve is normal in structure, no systolic anterior motion. Trivial mitral valve regurgitation. No evidence of mitral stenosis.  4. The aortic valve is tricuspid. Aortic valve regurgitation is trivial. No aortic stenosis is present.  5. The inferior vena cava is dilated in size with >50% respiratory variability, suggesting right atrial pressure of 8 mmHg.  6. The patient carries a diagnosis of hypertrophic cardiomyopathy. 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 FINDINGS  Left Ventricle: Left ventricular ejection fraction, by estimation, is 60 to 65%. The left ventricle has normal function. The left ventricle has no regional wall motion abnormalities. The average left ventricular global longitudinal strain is -11.1 %. The global longitudinal strain is abnormal. The left ventricular internal cavity size was normal in size. There is severe left ventricular hypertrophy. Left ventricular diastolic parameters are consistent with Grade II diastolic dysfunction (pseudonormalization). Right Ventricle: The right ventricular size is normal. Mildly increased right ventricular wall thickness. Right ventricular systolic function is mildly reduced. Tricuspid regurgitation signal is inadequate for assessing PA pressure. Pericardium: There is no evidence of pericardial effusion. Mitral Valve: The mitral valve is normal in structure. Trivial mitral valve regurgitation. No evidence of mitral valve stenosis. Tricuspid Valve: The tricuspid valve is normal in structure. Tricuspid valve regurgitation is not demonstrated. Aortic Valve: The aortic valve is tricuspid. Aortic valve regurgitation is trivial. No aortic  stenosis is present. Pulmonic Valve: The pulmonic valve was normal in structure. Pulmonic valve regurgitation is not visualized. Venous: The inferior vena cava is dilated in size with greater than 50% respiratory variability, suggesting right atrial pressure of 8 mmHg. LEFT VENTRICLE PLAX 2D LVIDd:         4.00 cm      Diastology LVIDs:         2.90 cm      LV e' medial:    5.22 cm/s LV PW:         1.80 cm      LV E/e' medial:  16.7 LV IVS:        1.80 cm      LV e' lateral:   5.55 cm/s                             LV E/e' lateral: 15.7  LV Volumes (MOD)            2D Longitudinal Strain LV vol d, MOD A2C: 102.0 ml 2D Strain GLS Avg:     -11.1 % LV vol d, MOD A4C: 132.0 ml LV  vol s, MOD A2C: 64.2 ml LV vol s, MOD A4C: 62.0 ml LV SV MOD A2C:     37.8 ml LV SV MOD A4C:     132.0 ml LV SV MOD BP:      54.7 ml IVC IVC diam: 2.40 cm LEFT ATRIUM         Index LA diam:    4.50 cm 2.33 cm/m  MITRAL VALVE MV Area (PHT): 2.84 cm MV Decel Time: 267 msec MV E velocity: 87.40 cm/s MV A velocity: 74.10 cm/s MV E/A ratio:  1.18 [place] Electronically signed by Franki Monte Signature Date/Time: 05/02/2021/6:40:45 PM    Final     Assessment/Plan **AKI on CKD 4:  fairly advanced CKD at baseline (has followed with Dr. Carolin Sicks) and now with trend worse in setting of HTN urgency, labile BPs with modest hypotension and diuresis.  He's nonoliguric with no indications for RRT currently.  Agree with current plan to hold diuretics (rec'd lasix 80 IV this AM prior to d/c) and follow.  I don't think he needs reimaging at this time but will check PVR just to r/o retention.  Quantify UP/C given proteinuria on dipstick but really even if nephrotic range I don't think he'd really be a candidate for renal biopsy or immunosuppressive therapies with comorbids.  Likewise, he would be a poor candidate for long term dialysis.   D/C'd IV ACEi in setting of AKI and potential intravascular volume depletion.  Follow I/Os, use low na/fluid  restricted diet, avoid nephrotoxins.   **HTN, resistant: presented with BP > 200.  Control is ok for now. Avoid hypotension.    **HFpEF:  cardiology following, diuretics on hold per above.   **DM: A1c < 7.   **Anemia:  Hb in 8s, management per primary - wouldn't give ESA in setting of malignancy unless onc directs.   **Rectal AdenoCa:   XRT therapy, other therapies limited by comorbids.   Will follow, call with issues.   Justin Mend 05/04/2021, 11:00 AM

## 2021-05-04 NOTE — TOC Progression Note (Signed)
Transition of Care [facility]) - Progression Note    Patient Details  Name: Robert Chaney MRN: [id number] Date of Birth: 1950/10/18  [facility] CM/SW Contact  Joaquin Courts, RN Phone Number: 05/04/2021, 1:40 PM  Clinical Narrative:    CM contacted Caren Griffins with [facility] per previous TOC notes to discuss concerns with current living situation.  Unable to reach [place], [place] indicates not to leave a message on the provided phone line.  TOC will continue to make attempts to gather additional information.    Expected Discharge Plan: Home/Self Care Barriers to Discharge: Continued Medical Work up  Expected Discharge Plan and Services Expected Discharge Plan: Home/Self Care   Discharge Planning Services: CM Consult   Living arrangements for the past 2 months: Single Family Home                                       Social Determinants of Health (SDOH) Interventions    Readmission Risk Interventions Readmission Risk Prevention Plan 05/04/2021 04/11/2021 03/22/2021  Transportation Screening Complete Complete Complete  PCP or Specialist Appt within 3-5 Days - - -  HRI or [place] - - -  Social Work Consult for [place] - - -  Medication Review Press photographer) Complete Complete Complete  PCP or Specialist appointment within 3-5 days of discharge Complete Complete Complete  HRI or Home Care Consult Complete Complete Complete  SW Recovery Care/Counseling Consult Complete Complete Complete  Palliative Care Screening Not Applicable Not Applicable Not [place] Not Applicable Not Applicable Not Applicable  Some recent data might be hidden

## 2021-05-04 NOTE — Progress Notes (Signed)
Progress Note    Robert Chaney   KZL:[id number]  DOB: 1951/04/04  DOA: 05/01/2021     3 PCP: [name], MD  Initial CC: Shortness of breath, chest pressure  Hospital Course: Robert Chaney is a 70  yo male with PMH rectal adenocarcinoma (dx CLN 03/2021), chronic hypoxic respiratory failure (4L at home), IDDM, HLD, HTN, CKD4, GERD, dCHF, HCM w/o LVOT obstruction, OSA on CPAP, glaucoma who presented to the hospital with worsening shortness of breath and nausea.  He was referred to the ER from the cancer center.  His dyspnea was worse with minimal exertion and improved with rest.  He denied any PND or orthopnea. On work-up he was found to be significantly hypertensive, 232/141 with mildly elevated troponin, 113 and elevated BNP. CXR showed pulmonary edema and patient was started on Lasix as well as IV labetalol and his medications were resumed. He was admitted for diuresis and further blood pressure control.  Interval History:  No events overnight. He does note voiding less than usual since yesterday. UOP was also about 1/2 than prior day.  He otherwise was feeling better in general and sitting up in the recliner this morning.   Assessment & Plan: * Hypertensive crisis-resolved as of 05/04/2021 - Patient presenting with marked markedly elevated blood pressures as high as 232/141 - Presence of acute cardiogenic pulmonary edema and elevated troponin with chest pain are concerning for acute hypertensive emergency - s/p NTG and labetalol; restarted on home meds also - BP responded but still above goal (imdur increased to 90 mg daily on 11/10 per cardiology) - continue amlodipine, Coreg, doxazosin, hydralazine, Imdur  Acute on chronic diastolic CHF (congestive heart failure) ([place]) - patient presenting with symptoms of worsening shortness of breath and pillow orthopnea - also noted to have pulmonary edema, B/L LE edema, DOE, pleural effusions -Cardiology also consulted on admission for  assistance as well as in setting of indeterminate troponin - Continue diuresis and blood pressure management -limited echo repeated on admission: EF 60-65%, Gr II DD - strict input and output monitoring - lasix break today due to renal function bump  Acute renal failure superimposed on stage 4 chronic kidney disease ([place]) - patient has history of CKD4. Baseline creat ~ 2.7 - 2.8, eGFR 21-24 - avoiding nephrotoxicity as able - some worsening of renal function on 11/11; most likely is just overdiuresis and he's getting a lasix holiday today but due to his BP on admission, etc, he is at risk for ATN - given frailty will have nephrology weigh in on if more workup needed, appreciate assistance in management of this patient   Elevated troponin level not due myocardial infarction-resolved as of 05/04/2021 - Patient presenting with elevated troponin of 113. Likely enzyme leak in setting of hypertensive crisis - denies CP but does have DOE and chest pressure with deep inspiration - no large uptrend with trending - no plans for ischemic evaluation per cardiology unless further criteria, especially in setting of CKD4   Chronic respiratory failure with hypoxia (HCC) - on chronic 4L  O2 at home  Type 2 diabetes mellitus with stage 4 chronic kidney disease, with long-term current use of insulin (HCC) - Hemoglobin A1C performed in October was 5.9% - continue diabetic diet  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[place]) - patient has carried this diagnosis previously; repeat echo this admission showing a "global longitudinal strain". This appears to raise some consideration that underlying amyloid may be playing a factor - cardiology following; patient will have  further testing likely outpt with possible PYP scan; follow up SPEP/UPEP as well   Rectal adenocarcinoma ([place]) - Recent diagnosis of rectal adenocarcinoma in 03/2021 - Patient has recently been established with Dr. [name] - Receiving  radiation therapy in the outpatient setting  GERD without esophagitis - continue PPI  Glaucoma - Continue home eyedrops  Mixed hyperlipidemia - continue statin    OSA on CPAP - CPAP QHS per home regimen    Old records reviewed in assessment of this patient  Antimicrobials:   DVT prophylaxis: Lovenox  Code Status:   Code Status: Full Code  Disposition Plan: Anticipated home on 11/12 Status is: Inpatient   Objective: Blood pressure 120/61, pulse (!) 55, temperature 98.9 F (37.2 C), temperature source Oral, resp. rate 17, height '5\' 8"'  (1.727 m), weight 76.6 kg, SpO2 100 %.  Examination:  Physical Exam Constitutional:      Appearance: Normal appearance.  HENT:     Head: Normocephalic and atraumatic.     Mouth/Throat:     Mouth: Mucous membranes are moist.  Eyes:     Extraocular Movements: Extraocular movements intact.  Cardiovascular:     Rate and Rhythm: Normal rate and regular rhythm.  Pulmonary:     Effort: Pulmonary effort is normal.     Breath sounds: Wheezing present.  Abdominal:     General: Bowel sounds are normal. There is no distension.     Palpations: Abdomen is soft.     Tenderness: There is no abdominal tenderness.  Musculoskeletal:        General: Swelling present. Normal range of motion.     Cervical back: Normal range of motion and neck supple.     Comments: R>L 1-2+ LE edema  Skin:    General: Skin is warm and dry.  Neurological:     General: No focal deficit present.     Mental Status: He is alert.  Psychiatric:        Mood and Affect: Mood normal.        Behavior: Behavior normal.     Consultants:  Cardiology Nephrology   Procedures:    Data Reviewed: I have personally reviewed labs and imaging studies     LOS: 3 days  Time spent: Greater than 50% of the 35 minute visit was spent in counseling/coordination of care for the patient as laid out in the A&P.   Dwyane Dee, MD [facility] 05/04/2021, 3:23 PM

## 2021-05-05 DIAGNOSIS — I169 Hypertensive crisis, unspecified: Secondary | ICD-10-CM | POA: Diagnosis not present

## 2021-05-05 DIAGNOSIS — I5033 Acute on chronic diastolic (congestive) heart failure: Secondary | ICD-10-CM | POA: Diagnosis not present

## 2021-05-05 DIAGNOSIS — N179 Acute kidney failure, unspecified: Secondary | ICD-10-CM | POA: Diagnosis not present

## 2021-05-05 DIAGNOSIS — N184 Chronic kidney disease, stage 4 (severe): Secondary | ICD-10-CM | POA: Diagnosis not present

## 2021-05-05 LAB — CBC WITH DIFFERENTIAL/PLATELET
Abs Immature Granulocytes: 0.01 10*3/uL (ref 0.00–0.07)
Basophils Absolute: 0 10*3/uL (ref 0.0–0.1)
Basophils Relative: 0 %
Eosinophils Absolute: 0 10*3/uL (ref 0.0–0.5)
Eosinophils Relative: 1 %
HCT: 29.7 % — ABNORMAL LOW (ref 39.0–52.0)
Hemoglobin: 9 g/dL — ABNORMAL LOW (ref 13.0–17.0)
Immature Granulocytes: 0 %
Lymphocytes Relative: 19 %
Lymphs Abs: 0.6 10*3/uL — ABNORMAL LOW (ref 0.7–4.0)
MCH: 29.7 pg (ref 26.0–34.0)
MCHC: 30.3 g/dL (ref 30.0–36.0)
MCV: 98 fL (ref 80.0–100.0)
Monocytes Absolute: 0.5 10*3/uL (ref 0.1–1.0)
Monocytes Relative: 14 %
Neutro Abs: 2.2 10*3/uL (ref 1.7–7.7)
Neutrophils Relative %: 66 %
Platelets: 175 10*3/uL (ref 150–400)
RBC: 3.03 MIL/uL — ABNORMAL LOW (ref 4.22–5.81)
RDW: 18.3 % — ABNORMAL HIGH (ref 11.5–15.5)
WBC: 3.3 10*3/uL — ABNORMAL LOW (ref 4.0–10.5)
nRBC: 0 % (ref 0.0–0.2)

## 2021-05-05 LAB — GLUCOSE, CAPILLARY
Glucose-Capillary: 193 mg/dL — ABNORMAL HIGH (ref 70–99)
Glucose-Capillary: 147 mg/dL — ABNORMAL HIGH (ref 70–99)
Glucose-Capillary: 105 mg/dL — ABNORMAL HIGH (ref 70–99)
Glucose-Capillary: 159 mg/dL — ABNORMAL HIGH (ref 70–99)

## 2021-05-05 LAB — MAGNESIUM: Magnesium: 2.2 mg/dL (ref 1.7–2.4)

## 2021-05-05 LAB — BASIC METABOLIC PANEL
Anion gap: 7 (ref 5–15)
BUN: 62 mg/dL — ABNORMAL HIGH (ref 8–23)
CO2: 26 mmol/L (ref 22–32)
Calcium: 8.4 mg/dL — ABNORMAL LOW (ref 8.9–10.3)
Chloride: 102 mmol/L (ref 98–111)
Creatinine, Ser: 3.52 mg/dL — ABNORMAL HIGH (ref 0.61–1.24)
GFR, Estimated: 18 mL/min — ABNORMAL LOW (ref >60)
Glucose, Bld: 107 mg/dL — ABNORMAL HIGH (ref 70–99)
Potassium: 4.1 mmol/L (ref 3.5–5.1)
Sodium: 135 mmol/L (ref 135–145)

## 2021-05-05 NOTE — Progress Notes (Signed)
Progress Note  Patient Name: Robert Chaney Date of Encounter: 05/05/2021  [facility] Cardiologist: Skeet Latch, MD   Subjective   Recorded net positive overnight - diuretics held. Creatinine has further increased to 3.52. SPEP/UPEP has not resulted.  Inpatient Medications    Scheduled Meds:  amLODipine  10 mg Oral Daily   atorvastatin  20 mg Oral Daily   brimonidine  1 drop Right Eye BID   carvedilol  25 mg Oral BID WC   docusate sodium  200 mg Oral BID   dorzolamide  1 drop Both Eyes BID   doxazosin  2 mg Oral QHS   enoxaparin (LOVENOX) injection  30 mg Subcutaneous Q24H   hydrALAZINE  100 mg Oral Q8H   insulin aspart  0-15 Units Subcutaneous TID AC & HS   insulin glargine-yfgn  10 Units Subcutaneous Daily   isosorbide mononitrate  90 mg Oral Daily   lactose free nutrition  237 mL Oral TID WC   latanoprost  1 drop Left Eye QHS   multivitamin with minerals  1 tablet Oral Daily   pantoprazole  40 mg Oral BID AC   Continuous Infusions:  PRN Meds: acetaminophen **OR** acetaminophen, albuterol, butamben-tetracaine-benzocaine, lidocaine, nitroGLYCERIN, ondansetron **OR** ondansetron (ZOFRAN) IV, oxyCODONE, polyethylene glycol   Vital Signs    Vitals:   05/04/21 0641 05/04/21 1154 05/04/21 2122 05/05/21 0456  BP: 133/68 120/61 (!) 146/62 (!) 142/66  Pulse: (!) 55 (!) 55 (!) 54 (!) 59  Resp:  17 18 16   Temp:  98.9 F (37.2 C) 97.8 F (36.6 C) (!) 97.3 F (36.3 C)  TempSrc:  Oral  Axillary  SpO2:  100% 100% 96%  Weight:      Height:        Intake/Output Summary (Last 24 hours) at 05/05/2021 0906 Last data filed at 05/05/2021 0500 Gross per 24 hour  Intake 1080 ml  Output 720 ml  Net 360 ml   Last 3 Weights 05/05/2021 05/03/2021 05/02/2021  Weight (lbs) (No Data) 168 lb 14 oz 168 lb 10.4 oz  Weight (kg) (No Data) 76.6 kg 76.5 kg      Telemetry    Sinus rhythm - Personally Reviewed  ECG    N/A  Physical Exam   GEN: No acute distress.   Neck: JVP +2 cm Cardiac: RRR. No murmurs, rubs, or gallops.  Respiratory: No increased work of breathing. Faint wheezing note in right base but no rhonchi or rales. GI: Soft, non-distended, and non-tender.  MS: Trace lower extremity edema bilaterally. No deformity. Skin: Warm and dry. Neuro:  No focal deficits. Psych: Normal affect. Responds appropriately.   Labs    High Sensitivity Troponin:   Recent Labs  Lab 04/08/21 0338 05/01/21 1713 05/01/21 2019 05/02/21 0510  TROPONINIHS 82* 113* 114* 94*     Chemistry Recent Labs  Lab 05/01/21 1713 05/01/21 1832 05/01/21 2359 05/02/21 0208 05/03/21 0434 05/04/21 0429 05/05/21 0522  NA 141   < > 140  --  140 137 135  K 4.9   < > 4.6  --  4.1 4.3 4.1  CL 108   < > 110  --  107 105 102  CO2 28  --  26  --  26 25 26   GLUCOSE 128*   < > 106*  --  105* 206* 107*  BUN 41*   < > 40*  --  52* 55* 62*  CREATININE 2.41*   < > 2.40*  --  2.78* 3.23* 3.52*  CALCIUM 9.4  --  8.8*  --  8.5* 8.1* 8.4*  MG  --   --   --    < > 1.9 2.0 2.2  PROT 8.2*  --  6.9  --   --   --   --   ALBUMIN 3.4*  --  2.7*  --   --   --   --   AST 21  --  18  --   --   --   --   ALT 26  --  21  --   --   --   --   ALKPHOS 106  --  86  --   --   --   --   BILITOT 0.8  --  0.7  --   --   --   --   GFRNONAA 28*  --  28*  --  23* 20* 18*  ANIONGAP 5  --  4*  --  7 7 7    < > = values in this interval not displayed.    Lipids No results for input(s): CHOL, TRIG, HDL, LABVLDL, LDLCALC, CHOLHDL in the last 168 hours.  Hematology Recent Labs  Lab 05/03/21 0434 05/04/21 0429 05/05/21 0522  WBC 3.1* 3.7* 3.3*  RBC 2.80* 2.81* 3.03*  HGB 8.4* 8.4* 9.0*  HCT 27.9* 27.5* 29.7*  MCV 99.6 97.9 98.0  MCH 30.0 29.9 29.7  MCHC 30.1 30.5 30.3  RDW 18.3* 18.3* 18.3*  PLT 174 153 175   Thyroid No results for input(s): TSH, FREET4 in the last 168 hours.  BNP Recent Labs  Lab 05/01/21 2019  BNP 3,367.1*    DDimer No results for input(s): DDIMER in the last 168  hours.   Radiology    No results found.  Cardiac Studies   Limited Echo 05/02/2021: Impressions:  1. Left ventricular ejection fraction, by estimation, is 60 to 65%. The  left ventricle has normal function. The left ventricle has no regional  wall motion abnormalities. There is severe left ventricular hypertrophy.  Left ventricular diastolic parameters   are consistent with Grade II diastolic dysfunction (pseudonormalization).  The average left ventricular global longitudinal strain is -11.1 %. The  global longitudinal strain is abnormal, there is relative preservation of  strain at the apex (bullseye  appearance). No LV outflow tract gradient.   2. Right ventricular systolic function is mildly reduced. The right  ventricular size is normal. Mildly increased right ventricular wall  thickness. Tricuspid regurgitation signal is inadequate for assessing PA  pressure.   3. The mitral valve is normal in structure, no systolic anterior motion.  Trivial mitral valve regurgitation. No evidence of mitral stenosis.   4. The aortic valve is tricuspid. Aortic valve regurgitation is trivial.  No aortic stenosis is present.   5. The inferior vena cava is dilated in size with >50% respiratory  variability, suggesting right atrial pressure of 8 mmHg.   6. The patient carries a diagnosis of hypertrophic cardiomyopathy.  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   Patient Profile     70 y.o. male with a history of hypertrophic cardiomyopathy without LVOT obstruction, chronic diastolic CHF, hypertension, hyperlipidemia, type 2 diabetes mellitus, chronic hypoxic respiratory failure on 4L of O2 at home, obstructive sleep apnea on CPAP, CKD stage IV, anemia, and recent diagnosis of rectal cancer in 03/2021 being treated with radiation who is being seen  for evaluation of acute CHF and hypertension at the request  of Dr. Cyd Silence.   Assessment & Plan    Acute on Chronic Hypoxic Respiratory Failure  Acute on Chronic Diastolic CHF Hypertrophic Cardiomyopathy  - Patient has chronic hypoxic respiratory failure and is on 4L of O2 at home but presented with progressive dyspnea on exertion with orthopnea and intermittent exertional chest pain. - BNP elevated at 3,367.  - Chest x-ray and CT suggestive of CHF with bilateral pleural effusions as well as concern for RUL pneumonia. However, procalcitonin was low so antibiotics have not been started.  - Echo this admission showed LVEF of 60-65 with normal wall motion, severe LVH, and grade 2 diastolic dysfunction. No LVOT gradient. Severe concentric LV and mild RVH as well as apical sparing strain pattern felt to be more suggestive of cardiac amyloidosis rather than hypertrophic cardiomyopathy. SPEP/UPEP pending. May need to consider PYP scan. - IV Lasix held d/t rising creatinine - continue to hold, may need nephrology evaluation.   Chest Pain Elevated Troponin - Patient has history of coronary artery calcifications on prior CT. Nuclear stress test in 2019 showed no ischemia. However, patient has never had deficitive ischemic work-up due to CKD stage IV.  Patient reports intermittent chest pain that occurs randomly - both at rest and exertion but not always brought on by exertion.  - High-sensitivity troponin mildly elevated and flat at 113 >>114 >>94. Not consistent with ACS. - Echo showed normal LV function and wall motion.  - Current home antianginal regimen: Amlodipine 10mg  daily, Coreg 25mg  twice daily, and Imdur 60mg  daily. Imdur was increased to 90mg  on 11/10 and patient report improvement in chest pain some. Continue current regimen. - Would like to avoid cardiac catheterization given CKD stage IV.    Hypertensive Emergency - BP 232/141 on admission. Historically, BP has been very difficult to control despite multiple medications.  - Current home regimen:  Amlodipine 10mg  daily, Coreg 25mg  twice daily, Hydralazine thee times daily, and Imdur 60mg  daily. Doxazosin 2mg  daily was added this admission and Imdur was increased to 90mg  daily. - BP looks much better today. - Continue current regimen.   Hyperlipidemia - LDL 63 in 04/2020.  - Continue Lipitor 20mg  daily.   Type 2 Diabetes Mellitus - Hemoglobin A1c 5.9 on 04/03/2021. - Management per primary team.   CKD Stage IV - Creatinine increasing with diuresis:  2.41 >> 2.78 >> 3.23 >> 3.52 - Keep off diuretics - May need to get Nephrology involved to assist with diuresis.  Otherwise, per primary team: - Rectal cancer - GERD - Glaucoma - Chronic hypoxic respiratory failure on 4L at home  For questions or updates, please contact [facility] Please consult [email or web address] for contact info under   Pixie Casino, MD, FACC, [place] Director of the [facility] Diplomate of the American Board of Clinical Lipidology Attending Cardiologist  Direct Dial: [phone]  Fax: [phone]  Website:  [email or web address]  Pixie Casino, MD  05/05/2021, 9:06 AM

## 2021-05-05 NOTE — Progress Notes (Addendum)
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  He was referred to the ER from the cancer center.  His dyspnea was worse with minimal exertion and improved with rest.  He denied any PND or orthopnea. On work-up he was found to be significantly hypertensive, 232/141 with mildly elevated troponin, 113 and elevated BNP. CXR showed pulmonary edema and patient was started on Lasix as well as IV labetalol and his medications were resumed. He was admitted for diuresis and further blood pressure control.  Interval History:  No events overnight.  He still endorses voiding less than usual.  Denies any chest pain or shortness of breath however.  Assessment & Plan: * Hypertensive crisis-resolved as of 05/04/2021 - Patient presenting with marked markedly elevated blood pressures as high as 232/141 - Presence of acute cardiogenic pulmonary edema and elevated troponin with chest pain are concerning for acute hypertensive emergency - s/p NTG and labetalol; restarted on home meds also - BP responded but still above goal (imdur increased to 90 mg daily on 11/10 per cardiology) - continue amlodipine, Coreg, doxazosin, hydralazine, Imdur  Acute on chronic diastolic CHF (congestive heart failure) ([place]) - patient presenting with symptoms of worsening shortness of breath and pillow orthopnea - also noted to have pulmonary edema, B/L LE edema, DOE, pleural effusions -Cardiology also consulted on admission for assistance as well as in setting of indeterminate troponin - diuresis (lasix held starting  11/11 due to increase in creatinine) and blood pressure management -limited echo repeated on admission: EF 60-65%, Gr II DD - strict input and output monitoring - continue to hold lasix; nephrology following   Acute renal failure superimposed on stage 4 chronic kidney disease ([place]) - patient has history of CKD4. Baseline creat ~ 2.7 - 2.8, eGFR 21-24 - avoiding nephrotoxicity as able - some worsening of renal function on 11/11; most likely is just overdiuresis and he's getting a lasix holiday today but due to his BP on admission, etc, he is at risk for ATN -Appreciate nephrology evaluation.  SPEP/UPEP ordered as well - Creatinine still uptrending further today, Scr 3.52.  Lasix remains on hold.  Urine output still decreased  Elevated troponin level not due myocardial infarction-resolved as of 05/04/2021 - Patient presenting with elevated troponin of 113. Likely enzyme leak in setting of hypertensive crisis - denies CP but does have DOE and chest pressure with deep inspiration - no large uptrend with trending - no plans for ischemic evaluation per cardiology unless further criteria, especially in setting of CKD4   Chronic respiratory failure with hypoxia (HCC) - on chronic 4L [place] O2 at home  Type 2 diabetes mellitus with stage 4 chronic kidney disease, with long-term current use of insulin (HCC) - Hemoglobin A1C performed in October was 5.9% - continue diabetic diet  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[place]) - patient has carried this diagnosis previously; repeat echo this admission showing a "global longitudinal strain". This appears to raise some consideration that underlying amyloid may be playing a factor - cardiology following; patient will have further testing likely outpt  with possible PYP scan; follow up SPEP/UPEP as well   Rectal adenocarcinoma ([place]) - Recent diagnosis of rectal adenocarcinoma in 03/2021 - Patient has recently been established with Dr. [name] -  Receiving radiation therapy in the outpatient setting -Cetacaine spray ordered for rectal pain.  Also continue as needed oxycodone  GERD without esophagitis - continue PPI  Glaucoma - Continue home eyedrops  Mixed hyperlipidemia - continue statin    OSA on CPAP - CPAP QHS per home regimen   Old records reviewed in assessment of this patient  Antimicrobials:   DVT prophylaxis: Lovenox  Code Status:   Code Status: Full Code  Disposition Plan: Anticipated home ~ Monday or Tues Status is: Inpatient   Objective: Blood pressure (!) 117/56, pulse (!) 57, temperature 98.1 F (36.7 C), temperature source Oral, resp. rate 18, height '5\' 8"'  (1.727 m), weight 76.6 kg, SpO2 100 %.  Examination:  Physical Exam Constitutional:      Appearance: Normal appearance.  HENT:     Head: Normocephalic and atraumatic.     Mouth/Throat:     Mouth: Mucous membranes are moist.  Eyes:     Extraocular Movements: Extraocular movements intact.  Cardiovascular:     Rate and Rhythm: Normal rate and regular rhythm.  Pulmonary:     Effort: Pulmonary effort is normal.     Breath sounds: Normal breath sounds.  Abdominal:     General: Bowel sounds are normal. There is no distension.     Palpations: Abdomen is soft.     Tenderness: There is no abdominal tenderness.  Musculoskeletal:        General: Swelling present. Normal range of motion.     Cervical back: Normal range of motion and neck supple.     Comments: R>L 2+ LE edema  Skin:    General: Skin is warm and dry.  Neurological:     General: No focal deficit present.     Mental Status: He is alert.  Psychiatric:        Mood and Affect: Mood normal.        Behavior: Behavior normal.     Consultants:  Cardiology Nephrology   Procedures:    Data Reviewed: I have personally reviewed labs and imaging studies     LOS: 4 days  Time spent: Greater than 50% of the 35 minute visit was spent in counseling/coordination of care for the  patient as laid out in the A&P.   Dwyane Dee, MD [facility] 05/05/2021, 1:11 PM

## 2021-05-05 NOTE — Progress Notes (Signed)
[facility] Progress Note  Subjective: seen in room, up in the chair, no c/o's today.   70 yo w/ hx of CKD IV, DM, HFpEF, chron hypoxic resp failure (4L [place] at home), anemia, HTN, OSA, rectal cancer dx'd 03/2021, stage IIa getting XRT, not surgical candidate. He was admitted mid 10/22 w/ CHF, HTN urgency, and was dx'd w/ adenoca of rectum and was not a chemo or surgical candidate, XRT was started. Admitted 11/8 w/ gen weakness, HTN, dyspnea and nausea. High BP's, CXR w/ CHF and RUL PNA. Rx'd w/ IV and oral antiHTN, IV diuretics initially 40 BID then 80 IV BID. I/O's showed net even since admit, but wt's down 2-3kg. B/L creat in 2.4- 3 range. Here creat was 2.4 on admit and 3.2 yest and 3.5 today.  Pt has had wt loss of 17 lbs in the last 2-3 mos.   Vitals:   05/04/21 2122 05/05/21 0456 05/05/21 0900 05/05/21 1106  BP: (!) 146/62 (!) 142/66 (!) 138/53 (!) 117/56  Pulse: (!) 54 (!) 59 64 (!) 57  Resp: 18 16 18 18   Temp: 97.8 F (36.6 C) (!) 97.3 F (36.3 C) 98 F (36.7 C) 98.1 F (36.7 C)  TempSrc:  Axillary Oral Oral  SpO2: 100% 96% 99% 100%  Weight:      Height:        Exam: Gen: sitting comfortably in bedside chair  Eyes: anicteric, EOMI ENT: MMM Neck: supple, cannot appreciate JVD CV:  brady, regular, no rub Abd: soft, nontender Lungs: clear to bases GU: no foley Extr:  chronic woody texture, 2+ dense pretibial edema bilat LE's Neuro: grossly nonfocal   UA 11/8 - 0-5 wbc /rbc, clate     Assessment/ Plan: AKI on CKD 4:  fairly advanced CKD at baseline (has followed with Dr. Carolin Sicks) and now with trend worse in setting of HTN urgency, labile BPs with modest hypotension and diuresis.  He's nonoliguric with no indications for RRT currently. Current plan is to hold IV/ po diuretics and follow.  We don't think he needs reimaging at this time but will check PVR just to r/o retention.  Quantify UP/C given proteinuria on dipstick but even if nephrotic range I don't  think he'd be a candidate for renal biopsy or immunosuppressive therapies with comorbids.  Likewise, pt is a poor candidate for long term dialysis as well.   D/C'd IV ACEi in setting of AKI and potential intravascular volume depletion. Will follow.  HTN, resistant: presented with BP > 200.  Control is much better for now. Avoid hypotension.    HFpEF:  cardiology following, diuretics on hold per above.  DM: A1c < 5 Anemia:  Hb in 8s, management per primary - wouldn't give ESA in setting of malignancy unless onc directs Rectal AdenoCa: XRT therapy, other therapies limited by comorbids.      Rob Greco Gastelum 05/05/2021, 1:50 PM   Recent Labs  Lab 05/04/21 0429 05/05/21 0522  K 4.3 4.1  BUN 55* 62*  CREATININE 3.23* 3.52*  CALCIUM 8.1* 8.4*  HGB 8.4* 9.0*   Inpatient medications:  amLODipine  10 mg Oral Daily   atorvastatin  20 mg Oral Daily   brimonidine  1 drop Right Eye BID   carvedilol  25 mg Oral BID WC   docusate sodium  200 mg Oral BID   dorzolamide  1 drop Both Eyes BID   doxazosin  2 mg Oral QHS   enoxaparin (LOVENOX) injection  30 mg Subcutaneous Q24H  hydrALAZINE  100 mg Oral Q8H   insulin aspart  0-15 Units Subcutaneous TID AC & HS   insulin glargine-yfgn  10 Units Subcutaneous Daily   isosorbide mononitrate  90 mg Oral Daily   lactose free nutrition  237 mL Oral TID WC   latanoprost  1 drop Left Eye QHS   multivitamin with minerals  1 tablet Oral Daily   pantoprazole  40 mg Oral BID AC    acetaminophen **OR** acetaminophen, albuterol, butamben-tetracaine-benzocaine, lidocaine, nitroGLYCERIN, ondansetron **OR** ondansetron (ZOFRAN) IV, oxyCODONE, polyethylene glycol

## 2021-05-06 DIAGNOSIS — N184 Chronic kidney disease, stage 4 (severe): Secondary | ICD-10-CM | POA: Diagnosis not present

## 2021-05-06 DIAGNOSIS — N170 Acute kidney failure with tubular necrosis: Secondary | ICD-10-CM

## 2021-05-06 DIAGNOSIS — I5033 Acute on chronic diastolic (congestive) heart failure: Secondary | ICD-10-CM | POA: Diagnosis not present

## 2021-05-06 DIAGNOSIS — C20 Malignant neoplasm of rectum: Secondary | ICD-10-CM | POA: Diagnosis not present

## 2021-05-06 DIAGNOSIS — I169 Hypertensive crisis, unspecified: Secondary | ICD-10-CM | POA: Diagnosis not present

## 2021-05-06 DIAGNOSIS — J9611 Chronic respiratory failure with hypoxia: Secondary | ICD-10-CM | POA: Diagnosis not present

## 2021-05-06 LAB — PROTEIN ELECTROPHORESIS, SERUM
A/G Ratio: 0.9 (ref 0.7–1.7)
Albumin ELP: 2.6 g/dL — ABNORMAL LOW (ref 2.9–4.4)
Alpha-1-Globulin: 0.2 g/dL (ref 0.0–0.4)
Alpha-2-Globulin: 0.4 g/dL (ref 0.4–1.0)
Beta Globulin: 0.7 g/dL (ref 0.7–1.3)
Gamma Globulin: 1.6 g/dL (ref 0.4–1.8)
Globulin, Total: 3 g/dL (ref 2.2–3.9)
M-Spike, %: 1.4 g/dL — ABNORMAL HIGH
Total Protein ELP: 5.6 g/dL — ABNORMAL LOW (ref 6.0–8.5)

## 2021-05-06 LAB — CBC WITH DIFFERENTIAL/PLATELET
Abs Immature Granulocytes: 0.01 10*3/uL (ref 0.00–0.07)
Basophils Absolute: 0 10*3/uL (ref 0.0–0.1)
Basophils Relative: 0 %
Eosinophils Absolute: 0 10*3/uL (ref 0.0–0.5)
Eosinophils Relative: 1 %
HCT: 25.5 % — ABNORMAL LOW (ref 39.0–52.0)
Hemoglobin: 7.9 g/dL — ABNORMAL LOW (ref 13.0–17.0)
Immature Granulocytes: 0 %
Lymphocytes Relative: 17 %
Lymphs Abs: 0.6 10*3/uL — ABNORMAL LOW (ref 0.7–4.0)
MCH: 30.4 pg (ref 26.0–34.0)
MCHC: 31 g/dL (ref 30.0–36.0)
MCV: 98.1 fL (ref 80.0–100.0)
Monocytes Absolute: 0.4 10*3/uL (ref 0.1–1.0)
Monocytes Relative: 12 %
Neutro Abs: 2.3 10*3/uL (ref 1.7–7.7)
Neutrophils Relative %: 70 %
Platelets: 164 10*3/uL (ref 150–400)
RBC: 2.6 MIL/uL — ABNORMAL LOW (ref 4.22–5.81)
RDW: 18.2 % — ABNORMAL HIGH (ref 11.5–15.5)
WBC: 3.3 10*3/uL — ABNORMAL LOW (ref 4.0–10.5)
nRBC: 0 % (ref 0.0–0.2)

## 2021-05-06 LAB — BASIC METABOLIC PANEL
Anion gap: 4 — ABNORMAL LOW (ref 5–15)
BUN: 65 mg/dL — ABNORMAL HIGH (ref 8–23)
CO2: 27 mmol/L (ref 22–32)
Calcium: 8 mg/dL — ABNORMAL LOW (ref 8.9–10.3)
Chloride: 105 mmol/L (ref 98–111)
Creatinine, Ser: 3.36 mg/dL — ABNORMAL HIGH (ref 0.61–1.24)
GFR, Estimated: 19 mL/min — ABNORMAL LOW (ref >60)
Glucose, Bld: 80 mg/dL (ref 70–99)
Potassium: 4.4 mmol/L (ref 3.5–5.1)
Sodium: 136 mmol/L (ref 135–145)

## 2021-05-06 LAB — CULTURE, BLOOD (ROUTINE X 2)
Culture: NO GROWTH
Culture: NO GROWTH
Special Requests: ADEQUATE
Special Requests: ADEQUATE

## 2021-05-06 LAB — GLUCOSE, CAPILLARY
Glucose-Capillary: 97 mg/dL (ref 70–99)
Glucose-Capillary: 223 mg/dL — ABNORMAL HIGH (ref 70–99)
Glucose-Capillary: 243 mg/dL — ABNORMAL HIGH (ref 70–99)
Glucose-Capillary: 85 mg/dL (ref 70–99)
Glucose-Capillary: 164 mg/dL — ABNORMAL HIGH (ref 70–99)

## 2021-05-06 LAB — MAGNESIUM: Magnesium: 2.2 mg/dL (ref 1.7–2.4)

## 2021-05-06 NOTE — Progress Notes (Signed)
Progress Note  Patient Name: Robert Chaney Date of Encounter: 05/06/2021  [facility] Cardiologist: Skeet Latch, MD   Subjective   Appreciate nephrology recommendations - continuing to hold diuretics. Creatinine has improved today to 3.36 (from 3.52). Recorded net positive yesterday.  Inpatient Medications    Scheduled Meds:  amLODipine  10 mg Oral Daily   atorvastatin  20 mg Oral Daily   brimonidine  1 drop Right Eye BID   carvedilol  25 mg Oral BID WC   docusate sodium  200 mg Oral BID   dorzolamide  1 drop Both Eyes BID   doxazosin  2 mg Oral QHS   enoxaparin (LOVENOX) injection  30 mg Subcutaneous Q24H   hydrALAZINE  100 mg Oral Q8H   insulin aspart  0-15 Units Subcutaneous TID AC & HS   insulin glargine-yfgn  10 Units Subcutaneous Daily   isosorbide mononitrate  90 mg Oral Daily   lactose free nutrition  237 mL Oral TID WC   latanoprost  1 drop Left Eye QHS   multivitamin with minerals  1 tablet Oral Daily   pantoprazole  40 mg Oral BID AC   Continuous Infusions:  PRN Meds: acetaminophen **OR** acetaminophen, albuterol, butamben-tetracaine-benzocaine, lidocaine, nitroGLYCERIN, ondansetron **OR** ondansetron (ZOFRAN) IV, oxyCODONE, polyethylene glycol   Vital Signs    Vitals:   05/05/21 1557 05/05/21 2054 05/06/21 0409 05/06/21 0439  BP: 128/69 (!) 145/74 139/61   Pulse: (!) 59 60 (!) 58   Resp:  18 20   Temp:  (!) 97.3 F (36.3 C) 97.6 F (36.4 C)   TempSrc:   Oral   SpO2:  100% 95%   Weight:    79.5 kg  Height:        Intake/Output Summary (Last 24 hours) at 05/06/2021 0854 Last data filed at 05/06/2021 0700 Gross per 24 hour  Intake 1340 ml  Output --  Net 1340 ml   Last 3 Weights 05/06/2021 05/05/2021 05/03/2021  Weight (lbs) 175 lb 4.3 oz (No Data) 168 lb 14 oz  Weight (kg) 79.5 kg (No Data) 76.6 kg      Telemetry    Sinus rhythm with PVC's - Personally Reviewed  ECG    N/A  Physical Exam   GEN: No acute distress.  Neck:  JVP flat Cardiac: RRR. No murmurs, rubs, or gallops.  Respiratory: No increased work of breathing. Faint wheezing note in right base but no rhonchi or rales. GI: Soft, non-distended, and non-tender.  MS: Trace lower extremity edema bilaterally. No deformity. Skin: Warm and dry. Neuro:  No focal deficits. Psych: Normal affect. Responds appropriately.   Labs    High Sensitivity Troponin:   Recent Labs  Lab 04/08/21 0338 05/01/21 1713 05/01/21 2019 05/02/21 0510  TROPONINIHS 82* 113* 114* 94*     Chemistry Recent Labs  Lab 05/01/21 1713 05/01/21 1832 05/01/21 2359 05/02/21 0208 05/04/21 0429 05/05/21 0522 05/06/21 0525  NA 141   < > 140   < > 137 135 136  K 4.9   < > 4.6   < > 4.3 4.1 4.4  CL 108   < > 110   < > 105 102 105  CO2 28  --  26   < > 25 26 27   GLUCOSE 128*   < > 106*   < > 206* 107* 80  BUN 41*   < > 40*   < > 55* 62* 65*  CREATININE 2.41*   < > 2.40*   < >  3.23* 3.52* 3.36*  CALCIUM 9.4  --  8.8*   < > 8.1* 8.4* 8.0*  MG  --   --   --    < > 2.0 2.2 2.2  PROT 8.2*  --  6.9  --   --   --   --   ALBUMIN 3.4*  --  2.7*  --   --   --   --   AST 21  --  18  --   --   --   --   ALT 26  --  21  --   --   --   --   ALKPHOS 106  --  86  --   --   --   --   BILITOT 0.8  --  0.7  --   --   --   --   GFRNONAA 28*  --  28*   < > 20* 18* 19*  ANIONGAP 5  --  4*   < > 7 7 4*   < > = values in this interval not displayed.    Lipids No results for input(s): CHOL, TRIG, HDL, LABVLDL, LDLCALC, CHOLHDL in the last 168 hours.  Hematology Recent Labs  Lab 05/04/21 0429 05/05/21 0522 05/06/21 0525  WBC 3.7* 3.3* 3.3*  RBC 2.81* 3.03* 2.60*  HGB 8.4* 9.0* 7.9*  HCT 27.5* 29.7* 25.5*  MCV 97.9 98.0 98.1  MCH 29.9 29.7 30.4  MCHC 30.5 30.3 31.0  RDW 18.3* 18.3* 18.2*  PLT 153 175 164   Thyroid No results for input(s): TSH, FREET4 in the last 168 hours.  BNP Recent Labs  Lab 05/01/21 2019  BNP 3,367.1*    DDimer No results for input(s): DDIMER in the last 168  hours.   Radiology    No results found.  Cardiac Studies   Limited Echo 05/02/2021: Impressions:  1. Left ventricular ejection fraction, by estimation, is 60 to 65%. The  left ventricle has normal function. The left ventricle has no regional  wall motion abnormalities. There is severe left ventricular hypertrophy.  Left ventricular diastolic parameters   are consistent with Grade II diastolic dysfunction (pseudonormalization).  The average left ventricular global longitudinal strain is -11.1 %. The  global longitudinal strain is abnormal, there is relative preservation of  strain at the apex (bullseye  appearance). No LV outflow tract gradient.   2. Right ventricular systolic function is mildly reduced. The right  ventricular size is normal. Mildly increased right ventricular wall  thickness. Tricuspid regurgitation signal is inadequate for assessing PA  pressure.   3. The mitral valve is normal in structure, no systolic anterior motion.  Trivial mitral valve regurgitation. No evidence of mitral stenosis.   4. The aortic valve is tricuspid. Aortic valve regurgitation is trivial.  No aortic stenosis is present.   5. The inferior vena cava is dilated in size with >50% respiratory  variability, suggesting right atrial pressure of 8 mmHg.   6. The patient carries a diagnosis of hypertrophic cardiomyopathy.  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   Patient Profile     70 y.o. 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 Cyd Silence.   Assessment & Plan    Acute on Chronic Hypoxic Respiratory Failure  Acute on Chronic Diastolic CHF Hypertrophic Cardiomyopathy  - Patient has chronic hypoxic respiratory failure and is on 4L of O2 at home but presented with progressive dyspnea on exertion with orthopnea and intermittent exertional chest pain. - BNP elevated at 3,367.  - Chest x-ray and CT suggestive of CHF with bilateral pleural effusions as well as concern for RUL pneumonia. However, procalcitonin was low so antibiotics have not been started.  - Echo this admission showed LVEF of 60-65 with normal wall motion, severe LVH, and grade 2 diastolic dysfunction. No LVOT gradient. Severe concentric LV and mild RVH as well as apical sparing strain pattern felt to be more suggestive of cardiac amyloidosis rather than hypertrophic cardiomyopathy. SPEP/UPEP pending. May need to consider PYP scan. -Continue to hold IV Lasix d/t rising creatinine - appreciate nephrology recs, creatinine improved, but net positive.  Chest Pain Elevated Troponin - Patient has history of coronary artery calcifications on prior CT. Nuclear stress test in 2019 showed no ischemia. However, patient has never had deficitive ischemic work-up due to CKD stage IV.  Patient reports intermittent chest pain that occurs randomly - both at rest and exertion but not always brought on by exertion.  - High-sensitivity troponin mildly elevated and flat at 113 >>114 >>94. Not consistent with ACS. - Echo showed normal LV function and wall motion.  - Current home antianginal regimen: Amlodipine 10mg  daily, Coreg 25mg  twice daily, and Imdur 60mg  daily. Imdur was increased to 90mg  on 11/10 and patient report improvement in chest pain some. Continue current regimen. - Would like to avoid cardiac catheterization given CKD stage IV.    Hypertensive Emergency - BP 232/141 on admission. Historically, BP has been very difficult to control despite multiple medications.   - Current home regimen: Amlodipine 10mg  daily, Coreg 25mg  twice daily, Hydralazine thee times daily, and Imdur 60mg  daily. Doxazosin 2mg  daily was added this admission and Imdur was increased to 90mg  daily. - BP continues to be improved - Continue current regimen.   Hyperlipidemia - LDL 63 in 04/2020.  - Continue Lipitor 20mg  daily.   Type 2 Diabetes Mellitus - Hemoglobin A1c 5.9 on 04/03/2021. - Management per primary team.   CKD Stage IV - Creatinine increasing with diuresis:  2.41 >> 2.78 >> 3.23 >> 3.52 >> 3.36 - Keep off diuretics - appreciated nephrology recs and eventual volume management suggestions.  Otherwise, per primary team: - Rectal cancer - GERD - Glaucoma - Chronic hypoxic respiratory failure on 4L at home  For questions or updates, please contact [facility] Please consult [email or web address] for contact info under   Pixie Casino, MD, FACC, [place] Director of the [facility] Diplomate of the American Board of Clinical Lipidology Attending Cardiologist  Direct Dial: [phone]  Fax: [phone]  Website:  [email or web address]  Pixie Casino, MD  05/06/2021, 8:54 AM

## 2021-05-06 NOTE — Progress Notes (Signed)
Progress Note  Patient Name: Robert Chaney Date of Encounter: 05/07/2021  [facility] Cardiologist: Skeet Latch, MD   Subjective   Cr rising to 3.7. Resumed on lasix. Plan to talk with palliative care given limited options to discuss [place]  Inpatient Medications    Scheduled Meds:  amLODipine  10 mg Oral Daily   atorvastatin  20 mg Oral Daily   brimonidine  1 drop Right Eye BID   carvedilol  25 mg Oral BID WC   docusate sodium  200 mg Oral BID   dorzolamide  1 drop Both Eyes BID   doxazosin  2 mg Oral QHS   enoxaparin (LOVENOX) injection  30 mg Subcutaneous Q24H   hydrALAZINE  100 mg Oral Q8H   insulin aspart  0-15 Units Subcutaneous TID AC & HS   insulin glargine-yfgn  10 Units Subcutaneous Daily   isosorbide mononitrate  90 mg Oral Daily   lactose free nutrition  237 mL Oral TID WC   latanoprost  1 drop Left Eye QHS   multivitamin with minerals  1 tablet Oral Daily   pantoprazole  40 mg Oral BID AC   Continuous Infusions:  PRN Meds: acetaminophen **OR** acetaminophen, albuterol, butamben-tetracaine-benzocaine, lidocaine, nitroGLYCERIN, ondansetron **OR** ondansetron (ZOFRAN) IV, oxyCODONE, polyethylene glycol   Vital Signs    Vitals:   05/06/21 0409 05/06/21 0439 05/06/21 1229 05/06/21 2203  BP: 139/61  (!) 142/59 (!) 121/52  Pulse: (!) 58  63 (!) 59  Resp: 20  20   Temp: 97.6 F (36.4 C)  97.7 F (36.5 C)   TempSrc: Oral  Oral   SpO2: 95%  100%   Weight:  79.5 kg    Height:        Intake/Output Summary (Last 24 hours) at 05/07/2021 0510 Last data filed at 05/07/2021 0000 Gross per 24 hour  Intake 660 ml  Output 450 ml  Net 210 ml   Last 3 Weights 05/06/2021 05/05/2021 05/03/2021  Weight (lbs) 175 lb 4.3 oz (No Data) 168 lb 14 oz  Weight (kg) 79.5 kg (No Data) 76.6 kg      Telemetry    NSR - Personally Reviewed  ECG    No new tracing - Personally Reviewed  Physical Exam   GEN: Sitting up in bed, comfortable  Neck: No JVD Cardiac:  RR, 2/6 systolic murmur, no rubs Respiratory: Crackles at the bases bilaterally GI: Soft, nontender, non-distended  MS: Trace-1+ pitting edema bilaterally; warm Neuro:  Nonfocal  Psych: Normal affect   Labs    High Sensitivity Troponin:   Recent Labs  Lab 04/08/21 0338 05/01/21 1713 05/01/21 2019 05/02/21 0510  TROPONINIHS 82* 113* 114* 94*     Chemistry Recent Labs  Lab 05/01/21 1713 05/01/21 1832 05/01/21 2359 05/02/21 0208 05/04/21 0429 05/05/21 0522 05/06/21 0525  NA 141   < > 140   < > 137 135 136  K 4.9   < > 4.6   < > 4.3 4.1 4.4  CL 108   < > 110   < > 105 102 105  CO2 28  --  26   < > 25 26 27   GLUCOSE 128*   < > 106*   < > 206* 107* 80  BUN 41*   < > 40*   < > 55* 62* 65*  CREATININE 2.41*   < > 2.40*   < > 3.23* 3.52* 3.36*  CALCIUM 9.4  --  8.8*   < > 8.1* 8.4*  8.0*  MG  --   --   --    < > 2.0 2.2 2.2  PROT 8.2*  --  6.9  --   --   --   --   ALBUMIN 3.4*  --  2.7*  --   --   --   --   AST 21  --  18  --   --   --   --   ALT 26  --  21  --   --   --   --   ALKPHOS 106  --  86  --   --   --   --   BILITOT 0.8  --  0.7  --   --   --   --   GFRNONAA 28*  --  28*   < > 20* 18* 19*  ANIONGAP 5  --  4*   < > 7 7 4*   < > = values in this interval not displayed.    Lipids No results for input(s): CHOL, TRIG, HDL, LABVLDL, LDLCALC, CHOLHDL in the last 168 hours.  Hematology Recent Labs  Lab 05/05/21 0522 05/06/21 0525 05/07/21 0434  WBC 3.3* 3.3* 3.6*  RBC 3.03* 2.60* 2.53*  HGB 9.0* 7.9* 7.6*  HCT 29.7* 25.5* 24.8*  MCV 98.0 98.1 98.0  MCH 29.7 30.4 30.0  MCHC 30.3 31.0 30.6  RDW 18.3* 18.2* 17.9*  PLT 175 164 167   Thyroid No results for input(s): TSH, FREET4 in the last 168 hours.  BNP Recent Labs  Lab 05/01/21 2019  BNP 3,367.1*    DDimer No results for input(s): DDIMER in the last 168 hours.   Radiology    No results found.  Cardiac Studies   Limited Echo 05/02/2021: Impressions:  1. Left ventricular ejection fraction, by  estimation, is 60 to 65%. The  left ventricle has normal function. The left ventricle has no regional  wall motion abnormalities. There is severe left ventricular hypertrophy.  Left ventricular diastolic parameters   are consistent with Grade II diastolic dysfunction (pseudonormalization).  The average left ventricular global longitudinal strain is -11.1 %. The  global longitudinal strain is abnormal, there is relative preservation of  strain at the apex (bullseye  appearance). No LV outflow tract gradient.   2. Right ventricular systolic function is mildly reduced. The right  ventricular size is normal. Mildly increased right ventricular wall  thickness. Tricuspid regurgitation signal is inadequate for assessing PA  pressure.   3. The mitral valve is normal in structure, no systolic anterior motion.  Trivial mitral valve regurgitation. No evidence of mitral stenosis.   4. The aortic valve is tricuspid. Aortic valve regurgitation is trivial.  No aortic stenosis is present.   5. The inferior vena cava is dilated in size with >50% respiratory  variability, suggesting right atrial pressure of 8 mmHg.   6. The patient carries a diagnosis of hypertrophic cardiomyopathy.  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   Patient Profile     70 y.o. 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   Assessment & Plan    #Acute on Chronic Diastolic HF: #Acute on Chronic Hypoxic  Respiratory Failure: Patient with chronic hypoxia at home on 4L [place]. Presented with worsening dyspnea and orthopnea found to have acute diastolic HF exacerbation. TTE 05/12/21 with LVEF of 60-65% with normal wall motion, severe LVH, and grade 2 diastolic dysfunction. No LVOT gradient. Severe concentric LV and mild RVH as well as apical sparing strain pattern felt to be more suggestive of cardiac amyloidosis rather than hypertrophic cardiomyopathy. Unfortunately, Cr continues to rise despite holding lasix and patient remains overloaded. Given overall poor prognosis, he may not want to pursue HD. Will await palliative care discussions. 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 Also with thickening of the RV which more closely resembles amyloidosis. Pending palliative care discussion, can consider further work-up as out-patient if within [place]. -Agree with palliative care assessment -Can consider CMR vs PYP pending GOC  #Chest Pain: #Elevated Troponin: Low suspicion for ACS. Chest pain occurs intermittently without known triggers (occurs at rest and with exertion). Myoview 2019 without ischemia. Given CKD IV, would like to avoid cardiac cath. Now awaiting discussion with palliative as well given overall poor prognosis. -Palliative care consulted -Management of diastolic HF as above -Continue amlodipine 10mg  daily -Continue coreg 25mg  daily -Continue imdur 90mg  daily -Not a good cath candidate due to CKD IV  #HTN Emergency: Better controlled. Noted to have SBP 232 on admission. Has been historically difficult to control on multiple medications.  -Continue amlodipine  10mg  daily -Continue coreg 25mg  daily -Continue imdur 90mg  daily -Continue doxazosin 2mg  daily -Continue hydralazine 100mg  TID  #HLD: -Continue lipitor 20mg  daily  #CKD Stage IV: Poor response to IV diuresis with rising Cr. Limited options. Currently awaiting discussion with palliative care.   -Nephrology following, appreciate recs -Agree with palliative care consult -Lasix resumed per renal      For questions or updates, please contact [place] Please consult [email or web address] for contact info under        Signed, Freada Bergeron, MD  05/07/2021, 5:10 AM

## 2021-05-06 NOTE — Progress Notes (Signed)
Patient up in chair sleeping. O2 sats ranging 86-87% on room air. Oxygen replaced and O2 sat up to 94%.Eulas Post, RN

## 2021-05-06 NOTE — Progress Notes (Signed)
[facility] Progress Note  Subjective: seen in room, up in the chair, no c/o's today. Creat down 3.3 today.   Background: 70 yo w/ hx of CKD IV, DM, HFpEF, chron hypoxic resp failure (4L [place] at home), anemia, HTN, OSA. He was admitted mid 10/22 w/ CHF, HTN urgency, and was dx'd w/ adenoca of rectum, stage IIa, and was not a chemo or surgical candidate, XRT was started. Admitted 11/8 w/ gen weakness, HTN, dyspnea and nausea. High BP's, CXR w/ CHF and RUL PNA. Rx'd w/ IV and oral antiHTN, IV diuretics initially 40 BID then 80 IV BID. I/O's showed net even since admit, but wt's down 2-3kg. B/L creat in 2.4- 3 range. Here creat was 2.4 on admit and 3.2 yest and 3.5 today.  Pt has had wt loss of 17 lbs in the last 2-3 mos.   Vitals:   05/05/21 1557 05/05/21 2054 05/06/21 0409 05/06/21 0439  BP: 128/69 (!) 145/74 139/61   Pulse: (!) 59 60 (!) 58   Resp:  18 20   Temp:  (!) 97.3 F (36.3 C) 97.6 F (36.4 C)   TempSrc:   Oral   SpO2:  100% 95%   Weight:    79.5 kg  Height:        Exam: Gen: sitting comfortably in bedside chair  Eyes: anicteric, EOMI ENT: MMM Neck: supple, cannot appreciate JVD CV:  brady, regular, no rub Abd: soft, nontender Lungs: clear to bases GU: no foley Extr:  chronic woody texture, 2+ dense pretibial edema bilat LE's Neuro: grossly nonfocal     Date   Creat  eGFR    2010   1.2- 1.6    2013   4.6 >> 1.58  AKI    2015   1.5    2018   3.07 >> 1.66 AKI    2019   2.46 >> 1.80 AKI    2020   1.81- 2.04    2021   2.10- 2.69      Aug 2022  2.30- 2.67     Sept 2022  2.67- 3.04 Apr 2021   2.38- 2.83 23- 28 ml/min, stage IV    05/01/21   2.41  28       11/10  2.78     11/11  3.23      11/ 12  3.52  18       11/ 13  3.36  19    UA 11/8 - 0-5 wbc /rbc, clate     Assessment/ Plan: AKI on CKD 4:  advanced CKD at baseline (f/b Dr. Carolin Sicks) w/ AKI in setting of HTN urgency, labile BPs with modest hypotension and diuresis.  Making urine. Initial  plan was to hold further diuretics. No re-imaging needed at this time. Quantify UP/C given proteinuria but doubt he would be a candidate for renal biopsy or immunosuppressive therapies with comorbids.  Likewise, pt is a poor candidate for long term dialysis as well.  We dc'd ACEi in setting of AKI and potential intravascular volume depletion. Today creat down slightly to 3.3, improving. Have d/w patient. Will follow.  HTN, resistant: presented with BP > 200.  BP control is much better for now. Avoid hypotension.    HFpEF:  cardiology following, diuretics on hold per above. He has bilat LE edema but is not tolerating diuresis (see #1 above), recommend other measures for LE edema (ted Hose, elevation).  DM: A1c < 5 Anemia:  Hb in  8s, management per primary - wouldn't give ESA in setting of malignancy unless onc directs Rectal AdenoCa: XRT therapy, other therapies limited by comorbids.      Rob Avonna Iribe 05/06/2021, 10:15 AM   Recent Labs  Lab 05/05/21 0522 05/06/21 0525  K 4.1 4.4  BUN 62* 65*  CREATININE 3.52* 3.36*  CALCIUM 8.4* 8.0*  HGB 9.0* 7.9*    Inpatient medications:  amLODipine  10 mg Oral Daily   atorvastatin  20 mg Oral Daily   brimonidine  1 drop Right Eye BID   carvedilol  25 mg Oral BID WC   docusate sodium  200 mg Oral BID   dorzolamide  1 drop Both Eyes BID   doxazosin  2 mg Oral QHS   enoxaparin (LOVENOX) injection  30 mg Subcutaneous Q24H   hydrALAZINE  100 mg Oral Q8H   insulin aspart  0-15 Units Subcutaneous TID AC & HS   insulin glargine-yfgn  10 Units Subcutaneous Daily   isosorbide mononitrate  90 mg Oral Daily   lactose free nutrition  237 mL Oral TID WC   latanoprost  1 drop Left Eye QHS   multivitamin with minerals  1 tablet Oral Daily   pantoprazole  40 mg Oral BID AC    acetaminophen **OR** acetaminophen, albuterol, butamben-tetracaine-benzocaine, lidocaine, nitroGLYCERIN, ondansetron **OR** ondansetron (ZOFRAN) IV, oxyCODONE, polyethylene  glycol

## 2021-05-06 NOTE — Progress Notes (Signed)
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  He was referred to the ER from the cancer center.  His dyspnea was worse with minimal exertion and improved with rest.  He denied any PND or orthopnea. On work-up he was found to be significantly hypertensive, 232/141 with mildly elevated troponin, 113 and elevated BNP. CXR showed pulmonary edema and patient was started on Lasix as well as IV labetalol and his medications were resumed. He was admitted for diuresis and further blood pressure control.  Interval History:  No events overnight.  Leg edema is worsening and still making less urine he says.  Also rectum still painful and cetacaine spray not available yet (will follow up with pharmacy today).   Assessment & Plan: * Acute on chronic diastolic CHF (congestive heart failure) (HCC) - patient presenting with symptoms of worsening shortness of breath and pillow orthopnea - also noted to have pulmonary edema, B/L LE edema, DOE, pleural effusions -Cardiology also consulted on admission for assistance as well as in setting of indeterminate troponin - diuresis (lasix held starting 11/11 due to increase in creatinine) and blood pressure management -limited echo repeated on admission: EF 60-65%, Gr II DD - strict input and output monitoring - continue to hold lasix; nephrology following as well - follow up TED hose response  - unfortunate circumstance with limited ability to diurese in setting of brittle CKD4 and a poor HD candidate per nephrology  Acute renal failure superimposed on  stage 4 chronic kidney disease ([name]) - patient has history of CKD4. Baseline creat ~ 2.7 - 2.8, eGFR 21-24 - avoiding nephrotoxicity as able - some worsening of renal function on 11/11; most likely is just overdiuresis and he's getting a lasix holiday today but due to his BP on admission, etc, he is at risk for ATN -Appreciate nephrology evaluation.  SPEP/UPEP ordered as well - Urine output still decreased and becoming net positive with worsening LE edema (poor HD candidate per nephrology); some improvement in creatinine today; lasix remains on hold  Hypertensive crisis-resolved as of 05/04/2021 - Patient presenting with marked markedly elevated blood pressures as high as 232/141 - Presence of acute cardiogenic pulmonary edema and elevated troponin with chest pain are concerning for acute hypertensive emergency - s/p NTG and labetalol; restarted on home meds also - BP responded but still above goal (imdur increased to 90 mg daily on 11/10 per cardiology) - continue amlodipine, Coreg, doxazosin, hydralazine, Imdur  Elevated troponin level not due myocardial infarction-resolved as of 05/04/2021 - Patient presenting with elevated troponin of 113. Likely enzyme leak in setting of hypertensive crisis - denies CP but does have DOE and chest pressure with deep inspiration - no large uptrend with trending - no plans for ischemic evaluation per cardiology unless further criteria, especially in setting of CKD4   Chronic respiratory failure with hypoxia (HCC) - on chronic 4L  O2 at home  Type 2 diabetes mellitus with stage 4 chronic kidney disease, with long-term current use of insulin (HCC) - Hemoglobin A1C performed in October was 5.9% -  continue diabetic diet  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[place]) - patient has carried this diagnosis previously; repeat echo this admission showing a "global longitudinal strain". This appears to raise some consideration that underlying amyloid may  be playing a factor - cardiology following; patient will have further testing likely outpt with possible PYP scan; follow up SPEP/UPEP as well   Rectal adenocarcinoma ([place]) - Recent diagnosis of rectal adenocarcinoma in 03/2021 - Patient has recently been established with Dr. [name] - Receiving radiation therapy in the outpatient setting -Cetacaine spray ordered for rectal pain.  Also continue as needed oxycodone  GERD without esophagitis - continue PPI  Glaucoma - Continue home eyedrops  Mixed hyperlipidemia - continue statin    OSA on CPAP - CPAP QHS per home regimen   Old records reviewed in assessment of this patient  Antimicrobials:   DVT prophylaxis: Lovenox  Code Status:   Code Status: Full Code  Disposition Plan: Anticipated home ~ Monday or Tues Status is: Inpatient   Objective: Blood pressure 139/61, pulse (!) 58, temperature 97.6 F (36.4 C), temperature source Oral, resp. rate 20, height _0  (1.727 m), weight 79.5 kg, SpO2 95 %.  Examination:  Physical Exam Constitutional:      Appearance: Normal appearance.  HENT:     Head: Normocephalic and atraumatic.     Mouth/Throat:     Mouth: Mucous membranes are moist.  Eyes:     Extraocular Movements: Extraocular movements intact.  Cardiovascular:     Rate and Rhythm: Normal rate and regular rhythm.  Pulmonary:     Effort: Pulmonary effort is normal.     Breath sounds: Normal breath sounds.  Abdominal:     General: Bowel sounds are normal. There is no distension.     Palpations: Abdomen is soft.     Tenderness: There is no abdominal tenderness.  Musculoskeletal:        General: Swelling present. Normal range of motion.     Cervical back: Normal range of motion and neck supple.     Comments: B/L LE edema worsening, 3+ to upper shin  Skin:    General: Skin is warm and dry.  Neurological:     General: No focal deficit present.     Mental Status: He is alert.  Psychiatric:        Mood and Affect:  Mood normal.        Behavior: Behavior normal.     Consultants:  Cardiology Nephrology   Procedures:    Data Reviewed: I have personally reviewed labs and imaging studies     LOS: 5 days  Time spent: Greater than 50% of the 35 minute visit was spent in counseling/coordination of care for the patient as laid out in the A&P.   Dwyane Dee, MD [facility] 05/06/2021, 12:26 PM

## 2021-05-07 ENCOUNTER — Ambulatory Visit: Payer: PPO | Admitting: Podiatry

## 2021-05-07 DIAGNOSIS — Z7189 Other specified counseling: Secondary | ICD-10-CM

## 2021-05-07 DIAGNOSIS — J189 Pneumonia, unspecified organism: Secondary | ICD-10-CM | POA: Diagnosis not present

## 2021-05-07 DIAGNOSIS — I5033 Acute on chronic diastolic (congestive) heart failure: Secondary | ICD-10-CM | POA: Diagnosis not present

## 2021-05-07 DIAGNOSIS — N170 Acute kidney failure with tubular necrosis: Secondary | ICD-10-CM | POA: Diagnosis not present

## 2021-05-07 DIAGNOSIS — J9611 Chronic respiratory failure with hypoxia: Secondary | ICD-10-CM | POA: Diagnosis not present

## 2021-05-07 DIAGNOSIS — N184 Chronic kidney disease, stage 4 (severe): Secondary | ICD-10-CM | POA: Diagnosis not present

## 2021-05-07 DIAGNOSIS — I160 Hypertensive urgency: Secondary | ICD-10-CM | POA: Diagnosis not present

## 2021-05-07 DIAGNOSIS — Z515 Encounter for palliative care: Secondary | ICD-10-CM

## 2021-05-07 DIAGNOSIS — Z66 Do not resuscitate: Secondary | ICD-10-CM

## 2021-05-07 LAB — CBC WITH DIFFERENTIAL/PLATELET
Abs Immature Granulocytes: 0.02 10*3/uL (ref 0.00–0.07)
Basophils Absolute: 0 10*3/uL (ref 0.0–0.1)
Basophils Relative: 1 %
Eosinophils Absolute: 0 10*3/uL (ref 0.0–0.5)
Eosinophils Relative: 1 %
HCT: 24.8 % — ABNORMAL LOW (ref 39.0–52.0)
Hemoglobin: 7.6 g/dL — ABNORMAL LOW (ref 13.0–17.0)
Immature Granulocytes: 1 %
Lymphocytes Relative: 17 %
Lymphs Abs: 0.6 10*3/uL — ABNORMAL LOW (ref 0.7–4.0)
MCH: 30 pg (ref 26.0–34.0)
MCHC: 30.6 g/dL (ref 30.0–36.0)
MCV: 98 fL (ref 80.0–100.0)
Monocytes Absolute: 0.5 10*3/uL (ref 0.1–1.0)
Monocytes Relative: 14 %
Neutro Abs: 2.4 10*3/uL (ref 1.7–7.7)
Neutrophils Relative %: 66 %
Platelets: 167 10*3/uL (ref 150–400)
RBC: 2.53 MIL/uL — ABNORMAL LOW (ref 4.22–5.81)
RDW: 17.9 % — ABNORMAL HIGH (ref 11.5–15.5)
WBC: 3.6 10*3/uL — ABNORMAL LOW (ref 4.0–10.5)
nRBC: 0 % (ref 0.0–0.2)

## 2021-05-07 LAB — UPEP/UIFE/LIGHT CHAINS/TP, 24-HR UR
% BETA, Urine: 9.1 %
ALPHA 1 URINE: 2.2 %
Albumin, U: 70.5 %
Alpha 2, Urine: 4.2 %
Free Kappa Lt Chains,Ur: 48.65 mg/L (ref 1.17–86.46)
Free Kappa/Lambda Ratio: 13.37 (ref 1.83–14.26)
Free Lambda Lt Chains,Ur: 3.64 mg/L (ref 0.27–15.21)
GAMMA GLOBULIN URINE: 14 %
M-SPIKE %, Urine: 9.8 % — ABNORMAL HIGH
Total Protein, Urine: 62.4 mg/dL

## 2021-05-07 LAB — BASIC METABOLIC PANEL
Anion gap: 6 (ref 5–15)
BUN: 76 mg/dL — ABNORMAL HIGH (ref 8–23)
CO2: 27 mmol/L (ref 22–32)
Calcium: 8.1 mg/dL — ABNORMAL LOW (ref 8.9–10.3)
Chloride: 104 mmol/L (ref 98–111)
Creatinine, Ser: 3.78 mg/dL — ABNORMAL HIGH (ref 0.61–1.24)
GFR, Estimated: 16 mL/min — ABNORMAL LOW (ref >60)
Glucose, Bld: 92 mg/dL (ref 70–99)
Potassium: 4.9 mmol/L (ref 3.5–5.1)
Sodium: 137 mmol/L (ref 135–145)

## 2021-05-07 LAB — GLUCOSE, CAPILLARY
Glucose-Capillary: 119 mg/dL — ABNORMAL HIGH (ref 70–99)
Glucose-Capillary: 171 mg/dL — ABNORMAL HIGH (ref 70–99)
Glucose-Capillary: 107 mg/dL — ABNORMAL HIGH (ref 70–99)
Glucose-Capillary: 174 mg/dL — ABNORMAL HIGH (ref 70–99)

## 2021-05-07 LAB — MAGNESIUM: Magnesium: 2.5 mg/dL — ABNORMAL HIGH (ref 1.7–2.4)

## 2021-05-07 MED ORDER — DEXTROSE 5 % IV SOLN
100.0000 mg | Freq: Three times a day (TID) | INTRAVENOUS | Status: DC
  Administered 2021-05-07 – 2021-05-09 (×7): 100 mg via INTRAVENOUS
  Filled 2021-05-07 (×8): qty 10

## 2021-05-07 NOTE — Progress Notes (Addendum)
HEMATOLOGY-ONCOLOGY PROGRESS NOTE  SUBJECTIVE: Robert Chaney was last seen by medical oncology during his last hospitalization.  He had newly diagnosed rectal cancer.  Due to his underlying comorbidities, he was not a candidate for systemic therapy.  He did however receive a 2-week course of palliative radiation which he completed on 05/01/2021.  Now admitted with acute on chronic CHF and acute renal failure.  Renal function worsening and hemodialysis is being considered.  This morning, he reports ongoing edema.  He also reports shortness of breath.  He tells me that he is thinking about hemodialysis.  Undecided at this point in time.  Oncology History  Rectal adenocarcinoma ([place])  04/09/2021 Initial Diagnosis   Rectal adenocarcinoma ([place])   04/10/2021 Cancer Staging   Staging form: Colon and Rectum, AJCC 8th Edition - Clinical stage from 04/10/2021: Stage IIA (cT3, cN0, cM0) - Signed by Eppie Gibson, MD on 04/11/2021 Total positive nodes: 0     REVIEW OF SYSTEMS:   A 10 point review of systems is negative except as noted in the HPI.  PHYSICAL EXAMINATION: ECOG PERFORMANCE STATUS: 3 - Symptomatic, >50% confined to bed  Vitals:   05/07/21 0530 05/07/21 0743  BP: (!) 125/51   Pulse: (!) 58 (!) 59  Resp:    Temp: (!) 97.4 F (36.3 C)   SpO2: 95%    Filed Weights   05/03/21 0432 05/06/21 0439 05/07/21 0530  Weight: 76.6 kg 79.5 kg 79.8 kg    Intake/Output from previous day: 11/13 0701 - 11/14 0700 In: 106 [P.O.:420] Out: 450 [Urine:450]  GENERAL:alert, no distress and comfortable SKIN: skin color, texture, turgor are normal, no rashes or significant lesions EYES: normal, Conjunctiva are pink and non-injected, sclera clear OROPHARYNX:no exudate, no erythema and lips, buccal mucosa, and tongue normal  LUNGS: clear to auscultation and percussion with normal breathing effort HEART: regular rate & rhythm and no murmurs, 2+ bilateral lower extremity edema ABDOMEN:abdomen soft,  non-tender and normal bowel sounds NEURO: alert & oriented x 3 with fluent speech, no focal motor/sensory deficits  LABORATORY DATA:  I have reviewed the data as listed CMP Latest Ref Rng & Units 05/07/2021 05/06/2021 05/05/2021  Glucose 70 - 99 mg/dL 92 80 107(H)  BUN 8 - 23 mg/dL 76(H) 65(H) 62(H)  Creatinine 0.61 - 1.24 mg/dL 3.78(H) 3.36(H) 3.52(H)  Sodium 135 - 145 mmol/L 137 136 135  Potassium 3.5 - 5.1 mmol/L 4.9 4.4 4.1  Chloride 98 - 111 mmol/L 104 105 102  CO2 22 - 32 mmol/L 27 27 26   Calcium 8.9 - 10.3 mg/dL 8.1(L) 8.0(L) 8.4(L)  Total Protein 6.5 - 8.1 g/dL - - -  Total Bilirubin 0.3 - 1.2 mg/dL - - -  Alkaline Phos 38 - 126 U/L - - -  AST 15 - 41 U/L - - -  ALT 0 - 44 U/L - - -    Lab Results  Component Value Date   WBC 3.6 (L) 05/07/2021   HGB 7.6 (L) 05/07/2021   HCT 24.8 (L) 05/07/2021   MCV 98.0 05/07/2021   PLT 167 05/07/2021   NEUTROABS 2.4 05/07/2021    DG Chest 2 View  Result Date: 05/01/2021 CLINICAL DATA:  Chest pain EXAM: CHEST - 2 VIEW COMPARISON:  Chest radiograph dated 04/01/2021. FINDINGS: Cardiomegaly with vascular congestion and mild edema. Right upper lobe opacity consistent with pneumonia. Underlying mass is not excluded clinical correlation and follow-up to resolution recommended. Small bilateral pleural effusions suspected. No pneumothorax. Atherosclerotic calcification of the aorta. Degenerative  changes of the spine. No acute osseous pathology. IMPRESSION: 1. Cardiomegaly with findings of CHF. 2. Right upper lobe pneumonia. Electronically Signed   By: Anner Crete M.D.   On: 05/01/2021 19:36   CT Head Wo Contrast  Result Date: 05/01/2021 CLINICAL DATA:  Altered mental status EXAM: CT HEAD WITHOUT CONTRAST TECHNIQUE: Contiguous axial images were obtained from the base of the skull through the vertex without intravenous contrast. COMPARISON:  CT brain 01/30/2021 FINDINGS: Brain: No acute territorial infarction, hemorrhage or intracranial mass.  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 Mild atrophy. Moderate chronic small vessel ischemic changes of the white matter. Stable ventricle size. Vascular: No hyperdense vessels.  Carotid vascular calcification Skull: Normal. Negative for fracture or focal lesion. Sinuses/Orbits: Mucosal thickening in the sinuses Other: None IMPRESSION: 1. No CT evidence for acute intracranial abnormality. 2. Atrophy and chronic small vessel ischemic changes of the white matter. Multiple chronic cerebellar infarcts. Electronically Signed   By: Donavan Foil M.D.   On: 05/01/2021 19:38   CT CHEST WO CONTRAST  Result Date: 05/02/2021 CLINICAL DATA:  Abnormal radiograph. EXAM: CT CHEST WITHOUT CONTRAST TECHNIQUE: Multidetector CT imaging of the chest was performed following the standard protocol without IV contrast. COMPARISON:  Chest radiograph dated 05/01/2021. FINDINGS: Evaluation of this exam is limited in the absence of intravenous contrast. Cardiovascular: There is cardiomegaly. No pericardial effusion. There is coronary vascular calcification. Moderate atherosclerotic calcification of the thoracic aorta. No aneurysmal dilatation. There is mild dilatation of the main pulmonary trunk suggestive of pulmonary hypertension. Mediastinum/Nodes: Evaluation of hilar lymph nodes is limited due to consolidative changes of the lungs and in the absence of intravenous contrast. The esophagus is grossly unremarkable. No mediastinal fluid collection. Lungs/Pleura: Moderate right and small left pleural effusions with associated partial compressive atelectasis of the lower lobes. There is diffuse interstitial and interlobular septal prominence consistent with edema. There is a focal area of airspace opacity in the right upper lobe which may represent edema but concerning for pneumonia. There is no pneumothorax. The central airways are patent. Upper Abdomen: Mild fullness of the left renal collecting system. Musculoskeletal:  Degenerative changes of the spine. No acute osseous pathology. IMPRESSION: 1. Cardiomegaly with findings of CHF and bilateral pleural effusions. 2. Focal area of airspace opacity in the right upper lobe may represent edema but concerning for pneumonia. 3. Aortic Atherosclerosis (ICD10-I70.0). Electronically Signed   By: Anner Crete M.D.   On: 05/02/2021 00:23   CT CHEST WO CONTRAST  Result Date: 04/07/2021 CLINICAL DATA:  GI cancer, for staging EXAM: CT CHEST WITHOUT CONTRAST TECHNIQUE: Multidetector CT imaging of the chest was performed following the standard protocol without IV contrast. COMPARISON:  03/07/2021 FINDINGS: Cardiovascular: Mild cardiomegaly. No pericardial effusion. Hypodense blood pool relative to myocardium, suggesting anemia. No evidence of thoracic aortic aneurysm. Atherosclerotic calcifications of the aortic arch. Mild coronary atherosclerosis of the LAD and right coronary artery. Mediastinum/Nodes: No suspicious mediastinal lymphadenopathy. Visualized thyroid is unremarkable. Lungs/Pleura: Patchy opacity in the posterior right upper lobe (series 8/image 89), suggesting pneumonia. Moderate right and small left pleural effusions. Mild interlobular septal thickening in the upper lobes suggests very mild superimposed edema is possible. Mild bilateral lower lobe compressive atelectasis. 4 mm ground-glass nodule in the anterior right upper lobe (series 8/image 83), possibly related to the suspected edema, but warranting attention on follow-up. No pneumothorax. Upper Abdomen: Visualized upper abdomen is grossly unremarkable, noting vascular calcifications. Musculoskeletal: Degenerative changes of the visualized thoracolumbar spine. IMPRESSION: Right upper lobe opacity, suspicious  for pneumonia. Moderate right and small left pleural effusions. Suspected mild superimposed pulmonary edema. 4 mm ground-glass nodule in the anterior right upper lobe, possibly related to the suspected edema, but  warranting attention on follow-up. Consider follow-up CT chest in 3-6 months. No findings specific for metastatic disease in the chest. Aortic Atherosclerosis (ICD10-I70.0). Electronically Signed   By: Julian Hy M.D.   On: 04/07/2021 21:12   MR PELVIS W WO CONTRAST  Result Date: 04/08/2021 CLINICAL DATA:  Rectal mass on colonoscopy, suspected rectal cancer, metastatic disease evaluation EXAM: MRI ABDOMEN AND PELVIS WITHOUT AND WITH CONTRAST TECHNIQUE: Multiplanar multisequence MR imaging of the abdomen and pelvis was performed both before and after the administration of intravenous contrast. CONTRAST:  70mL GADAVIST GADOBUTROL 1 MMOL/ML IV SOLN COMPARISON:  None. FINDINGS: Markedly limited evaluation due to motion degradation. Lower chest: Moderate right and small left pleural effusions. Hepatobiliary: No focal hepatic lesion is seen, noting motion degradation. Gallbladder is grossly unremarkable. No intrahepatic or extrahepatic ductal dilatation. Pancreas:  Grossly unremarkable. Spleen:  Grossly unremarkable. Adrenals/Urinary Tract:  Adrenal glands are grossly unremarkable. Subcentimeter bilateral renal cysts.  No hydronephrosis. Trabeculated bladder. Stomach/Bowel: Stomach is grossly unremarkable. No evidence of bowel obstruction. Enhancing masslike concentric thickening involving the lower rectum (series 29/image 30), corresponding to the recently biopsied rectal Mass. Vascular/Lymphatic:  No evidence of aneurysm. Marked limited evaluation for lymph nodes given motion degradation. Reproductive: Prostate is grossly unremarkable. Other:  No abdominopelvic ascites. Musculoskeletal: No focal osseous lesions. IMPRESSION: Markedly limited evaluation due to motion degradation. Enhancing, masslike concentric wall thickening involving the lower rectum, corresponding to recently biopsied rectal mass. No definite findings of metastatic disease, noting significant limitations. Assuming this patient has rectal  adenocarcinoma, for more accurate staging, consider follow-up CT abdomen/pelvis with contrast in 6 weeks. Electronically Signed   By: Julian Hy M.D.   On: 04/08/2021 20:10   MR ABDOMEN W WO CONTRAST  Result Date: 04/08/2021 CLINICAL DATA:  Rectal mass on colonoscopy, suspected rectal cancer, metastatic disease evaluation EXAM: MRI ABDOMEN AND PELVIS WITHOUT AND WITH CONTRAST TECHNIQUE: Multiplanar multisequence MR imaging of the abdomen and pelvis was performed both before and after the administration of intravenous contrast. CONTRAST:  46mL GADAVIST GADOBUTROL 1 MMOL/ML IV SOLN COMPARISON:  None. FINDINGS: Markedly limited evaluation due to motion degradation. Lower chest: Moderate right and small left pleural effusions. Hepatobiliary: No focal hepatic lesion is seen, noting motion degradation. Gallbladder is grossly unremarkable. No intrahepatic or extrahepatic ductal dilatation. Pancreas:  Grossly unremarkable. Spleen:  Grossly unremarkable. Adrenals/Urinary Tract:  Adrenal glands are grossly unremarkable. Subcentimeter bilateral renal cysts.  No hydronephrosis. Trabeculated bladder. Stomach/Bowel: Stomach is grossly unremarkable. No evidence of bowel obstruction. Enhancing masslike concentric thickening involving the lower rectum (series 29/image 30), corresponding to the recently biopsied rectal mass. Vascular/Lymphatic:  No evidence of aneurysm. Markedly limited evaluation for lymph nodes given motion degradation. Reproductive: Prostate is grossly unremarkable. Other:  No abdominopelvic ascites. Musculoskeletal: No focal osseous lesions. IMPRESSION: Markedly limited evaluation due to motion degradation. Enhancing, masslike concentric wall thickening involving the lower rectum, corresponding to recently biopsied rectal mass. No definite findings of metastatic disease, noting significant limitations. Assuming this patient has rectal adenocarcinoma, for more accurate staging, consider follow-up CT  abdomen/pelvis with contrast in 6 weeks. Electronically Signed   By: Julian Hy M.D.   On: 04/08/2021 20:03   ECHOCARDIOGRAM LIMITED  Result Date: 05/02/2021    ECHOCARDIOGRAM LIMITED REPORT   Patient Name:   ZERIC BARANOWSKI Date of Exam: 05/02/2021  Medical Rec #:  [id number]     Height:       68.0 in Accession #:    [id number]    Weight:       175.0 lb Date of Birth:  Aug 30, 1950      BSA:          1.931 m Patient Age:    70 years      BP:           141/73 mmHg Patient Gender: M             HR:           62 bpm. Exam Location:  Inpatient Procedure: Limited Echo, Cardiac Doppler and Limited Color Doppler Indications:    CHF, HCM  History:        Patient has prior history of Echocardiogram examinations, most                 recent 03/19/2021. CHF and Hypertrophic Cardiomyopathy,                 Signs/Symptoms:Elevated Troponin; Risk Factors:Diabetes,                 Hypertension and HLD.  Sonographer:    Glo Herring Referring Phys: [id number] [place]  1. Left ventricular ejection fraction, by estimation, is 60 to 65%. The left ventricle has normal function. The left ventricle has no regional wall motion abnormalities. There is severe left ventricular hypertrophy. Left ventricular diastolic parameters  are consistent with Grade II diastolic dysfunction (pseudonormalization). The average left ventricular global longitudinal strain is -11.1 %. The global longitudinal strain is abnormal, there is relative preservation of strain at the apex (bullseye appearance). No LV outflow tract gradient.  2. Right ventricular systolic function is mildly reduced. The right ventricular size is normal. Mildly increased right ventricular wall thickness. Tricuspid regurgitation signal is inadequate for assessing PA pressure.  3. The mitral valve is normal in structure, no systolic anterior motion. Trivial mitral valve regurgitation. No evidence of mitral stenosis.  4. The aortic valve is tricuspid. Aortic valve  regurgitation is trivial. No aortic stenosis is present.  5. The inferior vena cava is dilated in size with >50% respiratory variability, suggesting right atrial pressure of 8 mmHg.  6. The patient carries a diagnosis of hypertrophic cardiomyopathy. 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 FINDINGS  Left Ventricle: Left ventricular ejection fraction, by estimation, is 60 to 65%. The left ventricle has normal function. The left ventricle has no regional wall motion abnormalities. The average left ventricular global longitudinal strain is -11.1 %. The global longitudinal strain is abnormal. The left ventricular internal cavity size was normal in size. There is severe left ventricular hypertrophy. Left ventricular diastolic parameters are consistent with Grade II diastolic dysfunction (pseudonormalization). Right Ventricle: The right ventricular size is normal. Mildly increased right ventricular wall thickness. Right ventricular systolic function is mildly reduced. Tricuspid regurgitation signal is inadequate for assessing PA pressure. Pericardium: There is no evidence of pericardial effusion. Mitral Valve: The mitral valve is normal in structure. Trivial mitral valve regurgitation. No evidence of mitral valve stenosis. Tricuspid Valve: The tricuspid valve is normal in structure. Tricuspid valve regurgitation is not demonstrated. Aortic Valve: The aortic valve is tricuspid. Aortic valve regurgitation is trivial. No aortic stenosis is present. Pulmonic Valve: The pulmonic valve was normal in structure. Pulmonic valve regurgitation is not visualized.  Venous: The inferior vena cava is dilated in size with greater than 50% respiratory variability, suggesting right atrial pressure of 8 mmHg. LEFT VENTRICLE PLAX 2D LVIDd:         4.00 cm      Diastology LVIDs:         2.90 cm      LV e' medial:    5.22 cm/s LV PW:         1.80 cm       LV E/e' medial:  16.7 LV IVS:        1.80 cm      LV e' lateral:   5.55 cm/s                             LV E/e' lateral: 15.7  LV Volumes (MOD)            2D Longitudinal Strain LV vol d, MOD A2C: 102.0 ml 2D Strain GLS Avg:     -11.1 % LV vol d, MOD A4C: 132.0 ml LV vol s, MOD A2C: 64.2 ml LV vol s, MOD A4C: 62.0 ml LV SV MOD A2C:     37.8 ml LV SV MOD A4C:     132.0 ml LV SV MOD BP:      54.7 ml IVC IVC diam: 2.40 cm LEFT ATRIUM         Index LA diam:    4.50 cm 2.33 cm/m  MITRAL VALVE MV Area (PHT): 2.84 cm MV Decel Time: 267 msec MV E velocity: 87.40 cm/s MV A velocity: 74.10 cm/s MV E/A ratio:  1.18 Dalton McleanMD Electronically signed by Franki Monte Signature Date/Time: 05/02/2021/6:40:45 PM    Final     ASSESSMENT AND PLAN:  70 year old male with multiple medical comorbidities as noted above stage IV CKD, diabetes mellitus, chronic diastolic CHF, chronic hypoxic respiratory failure on 4 L of O2 baseline, hypertension, hyperlipidemia with      1) Newly diagnosed rectal moderately differentiated adenocarcinoma with lower GI bleeding and severe iron deficiency anemia   2) poor functional status ECOG of 3-4   3) Anemia-multifactorial.  Primarily due to iron deficiency from GI bleeding from rectal cancer but also anemia secondary to his stage IV chronic kidney disease.   4) limiting shortness of breath -multifactorial Patient with chronic respiratory failure due to COPD on 4 L of oxygen by nasal cannula HOCM with acute on chronic CHF -currently in decompensated CHF. Chronic kidney disease stage IV with fluid overload. Pulmonary right-to-left shunt. Possible multifocal pneumonia   5) Helicobacter pylori positive gastritis 03/03/2021   6) diabetes type 2  7) AKI on CKD   PLAN -The patient completed a course of palliative radiation under the care of Dr. Isidore Moos for his rectal cancer. -Given his extensive medical comorbidities and decompensated cardiorespiratory status he was not a  surgical candidate. -Patient not a good candidate for concurrent chemotherapy.  Cannot use Xeloda due to his significant kidney problems. Cannot safely use 5-fluorouracil concurrent with radiation due to his significant cardiac comorbidities which are not stable at this time. -Now with worsening renal function and hemodialysis being considered.  From our standpoint, if patient wants to pursue hemodialysis, it would not change his management. -Palliative care consultation has been requested to clarify goals of care. -Patient understands the complex nature of his medical issues and limitations in his treatment. -Transfuse as needed for hemoglobin less than 7  Future Appointments  Date  Time Provider [place]  05/07/2021  2:30 PM Lorenda Peck, MD [facility]  05/10/2021  2:00 PM [facility] PALLIATIVE CARE [facility] None  05/14/2021  9:30 AM [facility] LAB [facility] None  05/14/2021 10:00 AM Brunetta Genera, MD [facility] None  05/14/2021 10:30 AM Pickenpack-Cousar, Carlena Sax, NP [facility] None  05/30/2021 11:40 AM Eppie Gibson, MD [facility] None  06/07/2021  1:30 PM [name], MD [facility]  07/20/2021  9:10 AM Shamleffer, Melanie Crazier, MD [facility] None      LOS: 6 days   Mikey Bussing, DNP, AGPCNP-BC, AOCNP 05/07/21

## 2021-05-07 NOTE — Progress Notes (Signed)
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  He was referred to the ER from the cancer center.  His dyspnea was worse with minimal exertion and improved with rest.  He denied any PND or orthopnea. On work-up he was found to be significantly hypertensive, 232/141 with mildly elevated troponin, 113 and elevated BNP. CXR showed pulmonary edema and patient was started on Lasix as well as IV labetalol and his medications were resumed. He was admitted for diuresis and further blood pressure control.  Interval History:  No events overnight.  Sitting on edge of bed in no distress but patient never really seems to complain except for rectal pain.   Assessment & Plan: * Acute on chronic diastolic CHF (congestive heart failure) (HCC) - patient presenting with symptoms of worsening shortness of breath and orthopnea - also noted to have pulmonary edema, B/L LE edema, DOE, pleural effusions -Cardiology also consulted on admission for assistance as well as in setting of indeterminate troponin - diuresis (lasix held starting 11/11 due to increase in creatinine) and blood pressure management; lasix resumed on 11/14 (renal function labile) -limited echo repeated on admission: EF 60-65%, Gr II DD - strict input and output monitoring - unfortunate circumstance with limited ability to diurese in setting of brittle CKD4; we will await palliative care discussion regarding patient wishes for [name] (see oncology note as well)  Acute renal failure superimposed on stage 4 chronic kidney disease ([place]) -  patient has history of CKD4. Baseline creat ~ 2.7 - 2.8, eGFR 21-24 - some worsening of renal function on 11/11; most likely was overdiuresis but also at risk for ATN -Appreciate nephrology evaluation.  SPEP/UPEP ordered as well - UOP still declining and LE edema worsening; need for HD may approaching however we are awaiting discussions with palliative care for [name]; with or without HD, he may still not be a candidate for chemo (per oncology notes)  Hypertensive crisis-resolved as of 05/04/2021 - Patient presenting with marked markedly elevated blood pressures as high as 232/141 - Presence of acute cardiogenic pulmonary edema and elevated troponin with chest pain are concerning for acute hypertensive emergency - s/p NTG and labetalol; restarted on home meds also - BP responded but still above goal (imdur increased to 90 mg daily on 11/10 per cardiology) - continue amlodipine, Coreg, doxazosin, hydralazine, Imdur  Elevated troponin level not due myocardial infarction-resolved as of 05/04/2021 - Patient presenting with elevated troponin of 113. Likely enzyme leak in setting of hypertensive crisis - denies CP but did have DOE and chest pressure with deep inspiration - no large uptrend with trending - no plans for ischemic evaluation per cardiology unless further criteria, especially in setting of CKD4   Chronic respiratory failure with hypoxia (HCC) - on chronic 4L [place] O2 at home  Type 2 diabetes mellitus with stage 4 chronic kidney disease, with long-term current use of insulin (HCC) - Hemoglobin A1C performed in October was 5.9% - continue diabetic diet  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[place]) - patient has carried this diagnosis previously; repeat echo this  admission showing a "global longitudinal strain". This appears to raise some consideration that underlying amyloid may be playing a factor - cardiology following; patient will have further testing likely outpt with possible  PYP scan; follow up SPEP/UPEP as well   Rectal adenocarcinoma ([place]) - Recent diagnosis of rectal adenocarcinoma in 03/2021 - Patient has recently been established with Robert Chaney - Receiving radiation therapy in the outpatient setting -Cetacaine spray ordered for rectal pain.  Also continue as needed oxycodone - does not appear to be a candidate for chemo or surgery due to cardiopulm status; decision of patient to pursue HD would not change cancer management per oncology   GERD without esophagitis - continue PPI  Glaucoma - Continue home eyedrops  Mixed hyperlipidemia - continue statin    OSA on CPAP - CPAP QHS per home regimen    Old records reviewed in assessment of this patient  Antimicrobials:   DVT prophylaxis: Lovenox  Code Status:   Code Status: DNR  Disposition Plan:  Status is: Inpatient   Objective: Blood pressure (!) 147/60, pulse 60, temperature 98.4 F (36.9 C), temperature source Oral, resp. rate 20, height '5\' 8"'  (1.727 m), weight 79.8 kg, SpO2 94 %.  Examination:  Physical Exam Constitutional:      Appearance: Normal appearance.  HENT:     Head: Normocephalic and atraumatic.     Mouth/Throat:     Mouth: Mucous membranes are moist.  Eyes:     Extraocular Movements: Extraocular movements intact.  Cardiovascular:     Rate and Rhythm: Normal rate and regular rhythm.  Pulmonary:     Effort: Pulmonary effort is normal.     Breath sounds: Normal breath sounds.  Abdominal:     General: Bowel sounds are normal. There is no distension.     Palpations: Abdomen is soft.     Tenderness: There is no abdominal tenderness.  Musculoskeletal:        General: Swelling present. Normal range of motion.     Cervical back: Normal range of motion and neck supple.     Comments: B/L LE edema worsening, 3+ to upper shin  Skin:    General: Skin is warm and dry.  Neurological:     General: No focal deficit present.     Mental Status: He is alert.  Psychiatric:         Mood and Affect: Mood normal.        Behavior: Behavior normal.     Consultants:  Cardiology Nephrology   Procedures:    Data Reviewed: I have personally reviewed labs and imaging studies     LOS: 6 days  Time spent: Greater than 50% of the 35 minute visit was spent in counseling/coordination of care for the patient as laid out in the A&P.   Dwyane Dee, MD [facility] 05/07/2021, 2:52 PM

## 2021-05-07 NOTE — Progress Notes (Signed)
[facility] Progress Note  Subjective: seen in room, up in the chair, no c/o's today. Creat down 3.3 today.   Background: 70 yo w/ hx of CKD IV, DM, HFpEF, chron hypoxic resp failure (4L [place] at home), anemia, HTN, OSA. He was admitted mid 10/22 w/ CHF, HTN urgency, and was dx'd w/ adenoca of rectum, stage IIa, and was not a chemo or surgical candidate, XRT was started. Admitted 11/8 w/ gen weakness, HTN, dyspnea and nausea. High BP's, CXR w/ CHF and RUL PNA. Rx'd w/ IV and oral antiHTN, IV diuretics initially 40 BID then 80 IV BID. I/O's showed net even since admit, but wt's down 2-3kg. B/L creat in 2.4- 3 range. Here creat was 2.4 on admit and 3.2 yest and 3.5 today.  Pt has had wt loss of 17 lbs in the last 2-3 mos.   Vitals:   05/06/21 1229 05/06/21 2203 05/07/21 0530 05/07/21 0743  BP: (!) 142/59 (!) 121/52 (!) 125/51   Pulse: 63 (!) 59 (!) 58 (!) 59  Resp: 20     Temp: 97.7 F (36.5 C)  (!) 97.4 F (36.3 C)   TempSrc: Oral  Oral   SpO2: 100%  95%   Weight:   79.8 kg   Height:        Exam: Gen: sitting comfortably in bedside chair  Eyes: anicteric, EOMI ENT: MMM Neck: supple, cannot appreciate JVD CV:  brady, regular, no rub Abd: soft, nontender Lungs: clear to bases GU: no foley Extr:  chronic woody texture, 2+ dense pretibial edema bilat LE's Neuro: grossly nonfocal     Date   Creat  eGFR    2010   1.2- 1.6    2013   4.6 >> 1.58  AKI    2015   1.5    2018   3.07 >> 1.66 AKI    2019   2.46 >> 1.80 AKI    2020   1.81- 2.04    2021   2.10- 2.69      Aug 2022  2.30- 2.67     Sept 2022  2.67- 3.04 Apr 2021   2.38- 2.83 23- 28 ml/min, stage IV    05/01/21   2.41  28       11/10  2.78     11/11  3.23      11/ 12  3.52  18       11/ 13  3.36  19    UA 11/8 - 0-5 wbc /rbc, clate     Assessment/ Plan: AKI on CKD 4:  advanced CKD at baseline (f/b Dr. Carolin Sicks) w/ AKI in setting of HTN urgency, labile BPs with relative hypotension and diuresis.  Had  diffuse vol overload/ pulm edema by imaging on admission. Creat was 2.4 on admission, then rose up to mid 3's after getting IV lasix so the lasix was stopped. Question if pt would be chemo candidate if his volume overload/ CHF/ cardiopulm status could be stabilized w/ dialysis? Have d/w pmd. Will resume IV lasix for now given continued volume overload. Palliative care will see him and discuss w/ him today about GOC.  HTN, resistant: presented with BP > 200.  BP control better now.  A/C diast CHF:  cardiology following. Diffuse CHF by CXR and CT on admission. No sig progress, wt's unchanged since admit and I/O are + overall. Anemia:  Hb in 8s, management per primary - wouldn't give ESA in setting of malignancy  unless onc directs Rectal AdenoCa: XRT therapy recommended     Rob Jocelyne Reinertsen 05/07/2021, 8:05 AM   Recent Labs  Lab 05/06/21 0525 05/07/21 0434  K 4.4 4.9  BUN 65* 76*  CREATININE 3.36* 3.78*  CALCIUM 8.0* 8.1*  HGB 7.9* 7.6*    Inpatient medications:  amLODipine  10 mg Oral Daily   atorvastatin  20 mg Oral Daily   brimonidine  1 drop Right Eye BID   carvedilol  25 mg Oral BID WC   docusate sodium  200 mg Oral BID   dorzolamide  1 drop Both Eyes BID   doxazosin  2 mg Oral QHS   enoxaparin (LOVENOX) injection  30 mg Subcutaneous Q24H   hydrALAZINE  100 mg Oral Q8H   insulin aspart  0-15 Units Subcutaneous TID AC & HS   insulin glargine-yfgn  10 Units Subcutaneous Daily   isosorbide mononitrate  90 mg Oral Daily   lactose free nutrition  237 mL Oral TID WC   latanoprost  1 drop Left Eye QHS   multivitamin with minerals  1 tablet Oral Daily   pantoprazole  40 mg Oral BID AC    acetaminophen **OR** acetaminophen, albuterol, butamben-tetracaine-benzocaine, lidocaine, nitroGLYCERIN, ondansetron **OR** ondansetron (ZOFRAN) IV, oxyCODONE, polyethylene glycol

## 2021-05-07 NOTE — TOC Progression Note (Addendum)
Transition of Care [facility]) - Progression Note    Patient Details  Name: Robert Chaney MRN: [id number] Date of Birth: April 27, 1951  Transition of [facility]) CM/SW Contact  Ludwig Tugwell, Juliann Pulse, RN Phone Number: 05/07/2021, 10:42 AM  Clinical Narrative: spoke to [facility] rep [phone] with patient's safely @ home;adl's,food,medicaid applic,palliative care services,support system. Informed rep CM will f/u on concerns. PT cons placed.   4p-spoke to patient-states uses [facility] transport for pcp appts, friends pick up food for him,he tries to get to bathroom as best he can.[facility] rep Ashley-she will follow patient.-informed her of all concerns.Will await PT recc.    Expected Discharge Plan: Home/Self Care Barriers to Discharge: Continued Medical Work up  Expected Discharge Plan and Services Expected Discharge Plan: Home/Self Care   Discharge Planning Services: CM Consult   Living arrangements for the past 2 months: Single Family Home                                       Social Determinants of Health (SDOH) Interventions    Readmission Risk Interventions Readmission Risk Prevention Plan 05/04/2021 04/11/2021 03/22/2021  Transportation Screening Complete Complete Complete  PCP or Specialist Appt within 3-5 Days - - -  HRI or [place] - - -  Social Work Consult for [place] - - -  Medication Review Press photographer) Complete Complete Complete  PCP or Specialist appointment within 3-5 days of discharge Complete Complete Complete  HRI or Home Care Consult Complete Complete Complete  SW Recovery Care/Counseling Consult Complete Complete Complete  Palliative Care Screening Not Applicable Not Applicable Not [place] Not Applicable Not Applicable Not Applicable  Some recent data might be hidden

## 2021-05-08 DIAGNOSIS — N170 Acute kidney failure with tubular necrosis: Secondary | ICD-10-CM | POA: Diagnosis not present

## 2021-05-08 DIAGNOSIS — I169 Hypertensive crisis, unspecified: Secondary | ICD-10-CM | POA: Diagnosis not present

## 2021-05-08 DIAGNOSIS — I421 Obstructive hypertrophic cardiomyopathy: Secondary | ICD-10-CM

## 2021-05-08 DIAGNOSIS — J9611 Chronic respiratory failure with hypoxia: Secondary | ICD-10-CM | POA: Diagnosis not present

## 2021-05-08 DIAGNOSIS — I5033 Acute on chronic diastolic (congestive) heart failure: Secondary | ICD-10-CM | POA: Diagnosis not present

## 2021-05-08 LAB — GLUCOSE, CAPILLARY
Glucose-Capillary: 115 mg/dL — ABNORMAL HIGH (ref 70–99)
Glucose-Capillary: 214 mg/dL — ABNORMAL HIGH (ref 70–99)
Glucose-Capillary: 103 mg/dL — ABNORMAL HIGH (ref 70–99)
Glucose-Capillary: 197 mg/dL — ABNORMAL HIGH (ref 70–99)

## 2021-05-08 LAB — BASIC METABOLIC PANEL
Anion gap: 7 (ref 5–15)
BUN: 75 mg/dL — ABNORMAL HIGH (ref 8–23)
CO2: 27 mmol/L (ref 22–32)
Calcium: 8.3 mg/dL — ABNORMAL LOW (ref 8.9–10.3)
Chloride: 101 mmol/L (ref 98–111)
Creatinine, Ser: 3.62 mg/dL — ABNORMAL HIGH (ref 0.61–1.24)
GFR, Estimated: 17 mL/min — ABNORMAL LOW (ref >60)
Glucose, Bld: 93 mg/dL (ref 70–99)
Potassium: 4.6 mmol/L (ref 3.5–5.1)
Sodium: 135 mmol/L (ref 135–145)

## 2021-05-08 LAB — MAGNESIUM: Magnesium: 2.4 mg/dL (ref 1.7–2.4)

## 2021-05-08 NOTE — Progress Notes (Signed)
Daily Progress Note   Patient Name: Robert Chaney       Date: 05/08/2021 DOB: 05-18-1951  Age: 70 y.o. MRN#: [id number] Attending Physician: Dwyane Dee, MD Primary Care Physician: [name], MD Admit Date: 05/01/2021  Reason for Consultation/Follow-up: Establishing goals of care  Subjective: Chart Reviewed. Updates Received. Patient Assessed.   Patient sitting up on the side of the bed watching television.  Complains of pain in his rectum and generalized body aches.  States he does not feel well today.  Complains of weakness.  He is currently receiving Oxy IR as needed for pain.  States medication is effective in addition to topical lidocaine to his rectal area.  We discussed symptom management and if pain continues to worsen consideration and changes to his current regimen.  Robert Chaney verbalized understanding and appreciation.  I reviewed previous goals of care discussions and answered all questions.  Patient was able to successfully summarize our previous discussion.  He continues to express wishes for no hemodialysis, no escalation in care, treat the treatable, with a main focus on his comfort.  He is asking appropriate questions regarding hospice.  He states his niece who lives in [place] mentioned he could possibly stay with her however she is unsure if she can find a 2 bedroom apartment as this would be needed in order for him to be with her.  He states he knows that he is unable to return home by himself as he was barely making it previously alone.  Robert Chaney is asking about residential hospice and if this was an option.  Education provided on prognosis of 2 weeks or less to be considered a candidate for inpatient hospice. However he is a candidate for hospice and may become eligible for inpatient status. He verbalized understanding.   We attempted to contact his brother however he was unable to be reached. Voicemail left. Robert Chaney states he would like some support when calling his  brother and notifying him of his wishes. I inquired if going to live with his brother would be an option to spend the rest of his time however he states this is not an option due to limited space. He is tearful and concerned about were he will go from him. Emotional support provided. He states his hopes is that he would meet hospice criteria and would be pain free and pass away peacefully.   I encouraged Robert Chaney to continue to reach out to his brother and his niece. I recommended if he is able to speak with them on today to notify them we will plan to call again tomorrow at 1pm and hopefully by narrowing down a time family will be available to have further discussions.  He verbalized understanding with plans to continue to reach out and confirm ability to speak at specified time.  All questions answered and support provided.  Length of Stay: 7 days  Vital Signs: BP 131/60 (BP Location: Right Arm)   Pulse 62   Temp 98.8 F (37.1 C) (Oral)   Resp 20   Ht 5\' 8"  (1.727 m)   Wt 77.7 kg   SpO2 96%   BMI 26.05 kg/m  SpO2: SpO2: 96 % O2 Device: O2 Device: Nasal Cannula O2 Flow Rate: O2 Flow Rate (L/min): 4 L/min  Physical Exam: NAD RRR Diminished bilaterally, 4 L nasal cannula AAOx3, mood appropriate               Palliative Care Assessment & Plan   Code  Status: DNR  Goals of Care/Recommendations: Continue current plan of care, with no escalation or aggressive interventions No hemodialysis PMT will plan to follow-up with patient tomorrow and hopefully be able to contact his brother and niece to have further discussions at 1 PM.  Patient verbalized understanding with plans to continue to reach out to family today and notify them of plan to meeting. Patient is open to outpatient hospice support. PMT will continue to support and follow.    Symptom Management: Pain-Oxy IR as needed Nausea-Zofran Constipation-Miralax   Prognosis: POOR   Discharge Planning: To Be Determined  Thank  you for allowing the Palliative Medicine Team to assist in the care of this patient.  Time Total: 50 min.   Visit consisted of counseling and education dealing with the complex and emotionally intense issues of symptom management and palliative care in the setting of serious and potentially life-threatening illness.Greater than 50%  of this time was spent counseling and coordinating care related to the above assessment and plan.  Robert Chaney, AGPCNP-BC  Palliative Medicine Team [phone]

## 2021-05-08 NOTE — Progress Notes (Signed)
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  He was referred to the ER from the cancer center.  His dyspnea was worse with minimal exertion and improved with rest.  He denied any PND or orthopnea. On work-up he was found to be significantly hypertensive, 232/141 with mildly elevated troponin, 113 and elevated BNP. CXR showed pulmonary edema and patient was started on Lasix as well as IV labetalol and his medications were resumed. He was admitted for diuresis and further blood pressure control. Renal function did not tolerate diuresis.  He underwent evaluation with palliative care due to his decline and lack of improvement.  Patient elected for foregoing hemodialysis and instead focusing on pursuing more of a hospice/palliative route.   Interval History:  No events overnight.  Voided more than he has been the past couple days. Breathing is comfortable. He confirms his decision about not wanting HD if needed.   Assessment & Plan: * Acute on chronic diastolic CHF (congestive heart failure) (HCC) - patient presenting with symptoms of worsening shortness of breath and orthopnea - also noted to have pulmonary edema, B/L LE edema, DOE, pleural effusions -Cardiology also consulted on admission for assistance as well as in setting of indeterminate troponin - diuresis (lasix held starting 11/11 due to increase in creatinine) and blood pressure management; lasix resumed on 11/14 (renal function labile) -limited echo repeated on admission: EF 60-65%, Gr II DD - unfortunate circumstance with  limited ability to diurese in setting of brittle CKD4; patient met with palliative care on 11/14 and decided to avoid HD and instead was interested on focusing on comfort driven care (okay continuing lasix and other meds if helping but did not wish to escalate care); follow up formal palliative care eval   Acute renal failure superimposed on stage 4 chronic kidney disease ([place]) - patient has history of CKD4. Baseline creat ~ 2.7 - 2.8, eGFR 21-24 - some worsening of renal function on 11/11; most likely was overdiuresis but also at risk for ATN -Appreciate nephrology evaluation.  SPEP/UPEP ordered as well - see palliative care discussion; patient declining HD - okay for lasix 80 mg IV 2-3 times a day if provides comfort  Hypertensive crisis-resolved as of 05/04/2021 - Patient presenting with marked markedly elevated blood pressures as high as 232/141 - Presence of acute cardiogenic pulmonary edema and elevated troponin with chest pain are concerning for acute hypertensive emergency - s/p NTG and labetalol; restarted on home meds also - BP responded but still above goal (imdur increased to 90 mg daily on 11/10 per cardiology) - continue amlodipine, Coreg, doxazosin, hydralazine, Imdur  Elevated troponin level not due myocardial infarction-resolved as of 05/04/2021 - Patient presenting with elevated troponin of 113. Likely enzyme leak in setting of hypertensive crisis - denies CP but did have DOE and chest pressure with deep inspiration - no large uptrend with trending - no plans for ischemic evaluation especially given patient's decision to avoid further escalation    Chronic respiratory failure with hypoxia (HCC) - on chronic 4L [place] O2 at home  Type 2 diabetes mellitus with stage 4 chronic kidney disease, with long-term current use of insulin (HCC) - Hemoglobin A1C performed in October was 5.9% - continue diabetic diet  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[place]) - patient has  carried this diagnosis previously; repeat echo this admission showing a "global longitudinal strain". This appears to raise some consideration that underlying amyloid may be playing a factor - cardiology following; patient will have further testing likely outpt with possible PYP scan; follow up SPEP/UPEP as well   Rectal adenocarcinoma ([place]) - Recent diagnosis of rectal adenocarcinoma in 03/2021 - Patient has recently been established with Dr. [name] - Receiving radiation therapy in the outpatient setting -Cetacaine spray working well, patient requesting this at discharge.  Also continue as needed oxycodone - does not appear to be a candidate for chemo or surgery due to cardiopulm status; decision of patient to pursue HD would not change cancer management per oncology   GERD without esophagitis - continue PPI  Glaucoma - Continue home eyedrops  Mixed hyperlipidemia - continue statin    OSA on CPAP - CPAP QHS per home regimen   Old records reviewed in assessment of this patient  Antimicrobials:   DVT prophylaxis: Lovenox  Code Status:   Code Status: DNR  Disposition Plan:  Status is: Inpatient   Objective: Blood pressure 131/60, pulse 62, temperature 98.8 F (37.1 C), temperature source Oral, resp. rate 20, height _0  (1.727 m), weight 77.7 kg, SpO2 96 %.  Examination:  Physical Exam Constitutional:      Appearance: Normal appearance.  HENT:     Head: Normocephalic and atraumatic.     Mouth/Throat:     Mouth: Mucous membranes are moist.  Eyes:     Extraocular Movements: Extraocular movements intact.  Cardiovascular:     Rate and Rhythm: Normal rate and regular rhythm.  Pulmonary:     Effort: Pulmonary effort is normal.     Breath sounds: Normal breath sounds.  Abdominal:     General: Bowel sounds are normal. There is no distension.     Palpations: Abdomen is soft.     Tenderness: There is no abdominal tenderness.  Musculoskeletal:        General: Swelling  present. Normal range of motion.     Cervical back: Normal range of motion and neck supple.     Comments: B/L LE edema, 3+ to upper shin; slight improvement in left leg today  Skin:    General: Skin is warm and dry.  Neurological:     General: No focal deficit present.     Mental Status: He is alert.  Psychiatric:        Mood and Affect: Mood normal.        Behavior: Behavior normal.     Consultants:  Cardiology Nephrology  Palliative care  Procedures:    Data Reviewed: I have personally reviewed labs and imaging studies     LOS: 7 days  Time spent: Greater than 50% of the 35 minute visit was spent in counseling/coordination of care for the patient as laid out in the A&P.   Dwyane Dee, MD [facility] 05/08/2021, 3:14 PM

## 2021-05-08 NOTE — Progress Notes (Signed)
PT Cancellation Note  Patient Details Name: Robert Chaney MRN: [id number] DOB: 22-May-1951   Cancelled Treatment:    Reason Eval/Treat Not Completed:  Attempted PT eval-pt declined participation on today.    [facility]  Office: [phone] Pager: [phone]

## 2021-05-08 NOTE — Progress Notes (Signed)
Progress Note  Patient Name: Robert Chaney Date of Encounter: 05/08/2021  [facility] Cardiologist: Skeet Latch, MD   Subjective   Complains of pain in his rectum. Continues to have mild SOB.  Cr relatively stable 3.7>3.6 Net negative 672mL   Inpatient Medications    Scheduled Meds:  amLODipine  10 mg Oral Daily   atorvastatin  20 mg Oral Daily   brimonidine  1 drop Right Eye BID   carvedilol  25 mg Oral BID WC   docusate sodium  200 mg Oral BID   dorzolamide  1 drop Both Eyes BID   doxazosin  2 mg Oral QHS   enoxaparin (LOVENOX) injection  30 mg Subcutaneous Q24H   hydrALAZINE  100 mg Oral Q8H   insulin aspart  0-15 Units Subcutaneous TID AC & HS   insulin glargine-yfgn  10 Units Subcutaneous Daily   isosorbide mononitrate  90 mg Oral Daily   lactose free nutrition  237 mL Oral TID WC   latanoprost  1 drop Left Eye QHS   multivitamin with minerals  1 tablet Oral Daily   pantoprazole  40 mg Oral BID AC   Continuous Infusions:  furosemide 100 mg (05/08/21 0547)   PRN Meds: acetaminophen **OR** acetaminophen, albuterol, butamben-tetracaine-benzocaine, lidocaine, nitroGLYCERIN, ondansetron **OR** ondansetron (ZOFRAN) IV, oxyCODONE, polyethylene glycol   Vital Signs    Vitals:   05/07/21 2030 05/07/21 2224 05/08/21 0500 05/08/21 0536  BP: (!) 138/59 137/67  (!) 143/63  Pulse: 62   63  Resp: 20     Temp: 98.4 F (36.9 C)   98.8 F (37.1 C)  TempSrc: Oral   Oral  SpO2: 97%   97%  Weight:   77.7 kg   Height:        Intake/Output Summary (Last 24 hours) at 05/08/2021 1017 Last data filed at 05/08/2021 6283 Gross per 24 hour  Intake 1730 ml  Output 2850 ml  Net -1120 ml    Last 3 Weights 05/08/2021 05/07/2021 05/06/2021  Weight (lbs) 171 lb 4.8 oz 175 lb 14.8 oz 175 lb 4.3 oz  Weight (kg) 77.7 kg 79.8 kg 79.5 kg      Telemetry    NSR - Personally Reviewed  ECG    NSR, LVH, inferolateral TWI- Personally Reviewed  Physical Exam   GEN:  Sitting up in bed, comfortable  Neck: No JVD Cardiac: RR, 2/6 systolic murmur, no rubs Respiratory: Crackles at the bases bilaterally GI: Soft, nontender, non-distended  MS: Trace-1+ pitting edema bilaterally; warm Neuro:  Nonfocal  Psych: Normal affect   Labs    High Sensitivity Troponin:   Recent Labs  Lab 05/01/21 1713 05/01/21 2019 05/02/21 0510  TROPONINIHS 113* 114* 94*      Chemistry Recent Labs  Lab 05/01/21 1713 05/01/21 1832 05/01/21 2359 05/02/21 0208 05/06/21 0525 05/07/21 0434 05/08/21 0423  NA 141   < > 140   < > 136 137 135  K 4.9   < > 4.6   < > 4.4 4.9 4.6  CL 108   < > 110   < > 105 104 101  CO2 28  --  26   < > 27 27 27   GLUCOSE 128*   < > 106*   < > 80 92 93  BUN 41*   < > 40*   < > 65* 76* 75*  CREATININE 2.41*   < > 2.40*   < > 3.36* 3.78* 3.62*  CALCIUM 9.4  --  8.8*   < > 8.0* 8.1* 8.3*  MG  --   --   --    < > 2.2 2.5* 2.4  PROT 8.2*  --  6.9  --   --   --   --   ALBUMIN 3.4*  --  2.7*  --   --   --   --   AST 21  --  18  --   --   --   --   ALT 26  --  21  --   --   --   --   ALKPHOS 106  --  86  --   --   --   --   BILITOT 0.8  --  0.7  --   --   --   --   GFRNONAA 28*  --  28*   < > 19* 16* 17*  ANIONGAP 5  --  4*   < > 4* 6 7   < > = values in this interval not displayed.     Lipids No results for input(s): CHOL, TRIG, HDL, LABVLDL, LDLCALC, CHOLHDL in the last 168 hours.  Hematology Recent Labs  Lab 05/05/21 0522 05/06/21 0525 05/07/21 0434  WBC 3.3* 3.3* 3.6*  RBC 3.03* 2.60* 2.53*  HGB 9.0* 7.9* 7.6*  HCT 29.7* 25.5* 24.8*  MCV 98.0 98.1 98.0  MCH 29.7 30.4 30.0  MCHC 30.3 31.0 30.6  RDW 18.3* 18.2* 17.9*  PLT 175 164 167    Thyroid No results for input(s): TSH, FREET4 in the last 168 hours.  BNP Recent Labs  Lab 05/01/21 2019  BNP 3,367.1*     DDimer No results for input(s): DDIMER in the last 168 hours.   Radiology    No results found.  Cardiac Studies   Limited Echo 05/02/2021: Impressions:  1.  Left ventricular ejection fraction, by estimation, is 60 to 65%. The  left ventricle has normal function. The left ventricle has no regional  wall motion abnormalities. There is severe left ventricular hypertrophy.  Left ventricular diastolic parameters   are consistent with Grade II diastolic dysfunction (pseudonormalization).  The average left ventricular global longitudinal strain is -11.1 %. The  global longitudinal strain is abnormal, there is relative preservation of  strain at the apex (bullseye  appearance). No LV outflow tract gradient.   2. Right ventricular systolic function is mildly reduced. The right  ventricular size is normal. Mildly increased right ventricular wall  thickness. Tricuspid regurgitation signal is inadequate for assessing PA  pressure.   3. The mitral valve is normal in structure, no systolic anterior motion.  Trivial mitral valve regurgitation. No evidence of mitral stenosis.   4. The aortic valve is tricuspid. Aortic valve regurgitation is trivial.  No aortic stenosis is present.   5. The inferior vena cava is dilated in size with >50% respiratory  variability, suggesting right atrial pressure of 8 mmHg.   6. The patient carries a diagnosis of hypertrophic cardiomyopathy.  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   Patient Profile     70 y.o. 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   Assessment & Plan    #Acute on Chronic Diastolic  HF: #Acute on Chronic Hypoxic Respiratory Failure: Patient with chronic hypoxia at home on 4L [place]. Presented with worsening dyspnea and orthopnea found to have acute diastolic HF exacerbation. TTE 05/12/21 with LVEF of 60-65% with normal wall motion, severe LVH, and grade 2 diastolic dysfunction. No LVOT gradient. Severe concentric LV and mild RVH as well as apical sparing strain pattern felt to be more suggestive of cardiac amyloidosis rather than hypertrophic cardiomyopathy. Unfortunately, fluid management has been challenging and response to diuretics has been suboptimal. Overall poor prognosis with limited options. Awaiting palliative care discussion. 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 Also with thickening of the RV which more closely resembles amyloidosis. Pending palliative care discussion, can consider further work-up as out-patient if within [place]. -Agree with palliative care assessment  #Chest Pain: #Elevated Troponin: Low suspicion for ACS. Chest pain occurs intermittently without known triggers (occurs at rest and with exertion). Myoview 2019 without ischemia. Given CKD IV, would like to avoid cardiac cath. Now awaiting discussion with palliative as well given overall poor prognosis. -Palliative care consulted -Management of diastolic HF as above -Continue amlodipine 10mg  daily -Continue coreg 25mg  daily -Continue imdur 90mg  daily -Not a good cath candidate due to CKD IV and overall poor prognosis  #HTN Emergency: Better controlled. Noted to have SBP 232 on admission. Has been historically difficult to control on multiple medications.  -Continue amlodipine 10mg  daily -Continue coreg 25mg  daily -Continue imdur 90mg  daily -Continue doxazosin 2mg  daily -Continue  hydralazine 100mg  TID  #HLD: -Continue lipitor 20mg  daily  #CKD Stage IV: Poor response to IV diuresis. Limited options.Palliative care consulted -Appreciate palliative care recommendations -Lasix resumed per renal      For questions or updates, please contact [place] Please consult [email or web address] for contact info under        Signed, Freada Bergeron, MD  05/08/2021, 10:17 AM

## 2021-05-08 NOTE — Progress Notes (Signed)
Per palliative team discussions w/ patient, he is not interested in dialysis and is now DNR. Will sign off.  Recommend IV lasix 80mg  2-3 times per day for comfort as needed.   Kelly Splinter, MD 05/08/2021, 12:17 PM

## 2021-05-08 NOTE — Consult Note (Signed)
Palliative Care Consult Note                                  Date: 05/08/2021   Patient Name: Robert Chaney  DOB: 1950/09/13  MRN: [id number]  Age / Sex: 70 y.o., male  PCP: Robert Ebbs, MD Referring Physician: Dwyane Dee, MD  Reason for Consultation: Establishing goals of care  HPI/Patient Profile: Palliative Care consult requested for goals of care discussion in this 70 y.o. 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 He was admitted on 05/01/2021 with shortness of breath and chest pain. I saw patient at the cancer center initially for Palliative follow-up. When he presented patient appeared weak, complaining of fall prior to arrival, chest pain requiring him to take his home nitroglycerin, and increased lower extremity swelling. His blood pressure was 210/108 in the office. Patient was taken from clinic to [facility] emergency department for further evaluation.   Past Medical History:  Diagnosis Date   Altered mental status    a. 05/2017 - adm with blurred vision, somnolence in setting of AKI and high blood sugar.   Anemia    CKD (chronic kidney disease), stage III (HCC)    Diabetes mellitus    Diastolic CHF, chronic ([place])    A.  03/2009 Echo: EF 60-65%, Gr II diast dysfxn   Elevated troponin    a. 2013 - troponin 1.4. b. 2019 - troponin 0.32; neg nuc 07/2017.   Glaucoma    Hyperlipidemia    HYPERCHOLESTEROLEMIA   Hypertension    MARKED LEFT VENTRICULAR HYPERTROPHY BY PREVIOUS ECHOCARDIOGRAM--HE HAS HYPERDYNAMIC LEFT VENTRICULAR SYSTOLIC FUNCTION AND HAS IMPAIRED RELAXATION BY ECHO   Hypertrophic cardiomyopathy (HCC)    NSVT (nonsustained ventricular tachycardia)    Premature atrial contractions    PVC's (premature ventricular contractions)    SVT (supraventricular tachycardia) (HCC)      Subjective:    This NP Robert Chaney reviewed medical records, received report from team, assessed the patient and then met at the patient's bedside with Robert Chaney and Robert Soho, RN (Palliative Nurse) to discuss diagnosis, prognosis, GOC, EOL wishes disposition and options.  Patient was sitting up in the recliner, eating lunch. Denied pain. Endorses occasional shortness of breath. Alert and oriented x3.    Patient is familiar to myself and the Palliative team. I initially saw patient during recent hospitalization in October 2022 and began following him also at [facility].   We discussed His current illness and what it means in the larger context of His on-going co-morbidities. Natural disease trajectory and expectations were discussed. We discussed at length his current quality of life, in addition to reviewing recent labs and concerns for worsening renal failure and possibility of requiring dialysis for support.   Robert Chaney verbalized understanding of current illness and co-morbidities.   Robert Chaney becomes tearful expressing he is unsure why he is so ill as he lived a good life, tried to care for his body and his family. Also acknowledging his strong [place] faith and knowing God always has a plan.  He shares the death of both of his children over the years which was devastating to him.   We discussed at length best case and worst case scenario in relation to his current illness with main focus on his cardiac, renal,  and cancer diagnosis.  Robert Chaney acknowledges previous discussions with nephrology and his awareness of possibly requiring dialysis due to worsening kidney function.  He states he has thought long and hard about this throughout the day and does not feel that he would wish to undergo such therapies.  Education provided on dialysis and his rationale in addition to prognosis in the setting of no dialysis.  Robert Chaney his understanding expressing "why would I start another medical treatment to  prolong my life when I would still have cancer and issues with my heart" acknowledged his statement and provided support.  He also shares his reasoning of not being interested in dialysis due to his current perception of poor quality of life and possibility of even more of a decline in addition to uncertainty how well he would tolerate it given his heart conditions.  I empathetically discussed with patient that his thought process is valid and we do not know how his body will tolerate dialysis given all of his other comorbidities.  Robert Chaney states he is a man of strong faith and his main focus at this point in his life is to not leave this world in a suffering state and to be at peace.  He expresses if God was to call him home to heaven on today he would be okay with this as he knows his body would be in a better place.  Emotional support provided.  I created space and opportunity to discuss patient's current full CODE STATUS with consideration of his current statement about being at peace with end-of-life in addition to his current illness and comorbidities.  Education provided on what a emergent event would look like in a full code stating versus DNR.  Patient states he does not wish to receive any heroic or life-sustaining measures and wishes for a natural dying process.  Recommendations for DNR/DNI.  Patient again verbalizes agreement.  He is aware this will be notated on his chart and nursing staff will place a bracelet identifying his wishes to all medical staff.  I discussed the importance of continued conversation with family and their medical providers regarding overall plan of care and treatment options, ensuring decisions are within the context of the patients values and GOCs.  Questions and concerns were addressed.  Patient was encouraged to call with questions or concerns.  PMT will continue to support holistically as needed.  Life Review: Robert Chaney currently lives in the home alone.  Both of  his 2 children are deceased.  He is divorced.  He has 1 brother who lives in [place] and his sister who resides in [place].   Objective:   Primary Diagnoses: Present on Admission:  (Resolved) Hypertensive crisis  Acute renal failure superimposed on stage 4 chronic kidney disease (HCC)  (Resolved) Elevated troponin level not due myocardial infarction  Mixed hyperlipidemia  Acute on chronic diastolic CHF (congestive heart failure) (HCC)  Rectal adenocarcinoma (HCC)  Glaucoma  GERD without esophagitis  Chronic respiratory failure with hypoxia (HCC)  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HCC)   Scheduled Meds:  amLODipine  10 mg Oral Daily   atorvastatin  20 mg Oral Daily   brimonidine  1 drop Right Eye BID   carvedilol  25 mg Oral BID WC   docusate sodium  200 mg Oral BID   dorzolamide  1 drop Both Eyes BID   doxazosin  2 mg Oral QHS   enoxaparin (LOVENOX) injection  30 mg Subcutaneous Q24H   hydrALAZINE  100 mg Oral Q8H   insulin aspart  0-15 Units Subcutaneous TID AC & HS   insulin glargine-yfgn  10 Units Subcutaneous Daily   isosorbide mononitrate  90 mg Oral Daily   lactose free nutrition  237 mL Oral TID WC   latanoprost  1 drop Left Eye QHS   multivitamin with minerals  1 tablet Oral Daily   pantoprazole  40 mg Oral BID AC    Continuous Infusions:  furosemide 100 mg (05/08/21 0547)    PRN Meds: acetaminophen **OR** acetaminophen, albuterol, butamben-tetracaine-benzocaine, lidocaine, nitroGLYCERIN, ondansetron **OR** ondansetron (ZOFRAN) IV, oxyCODONE, polyethylene glycol  No Known Allergies  Review of Systems  Cardiovascular:  Positive for leg swelling.  Musculoskeletal:  Positive for arthralgias.  Neurological:  Positive for weakness.  Unless otherwise noted, a complete review of systems is negative.  Physical Exam General: NAD Cardiovascular: regular rate and rhythm Pulmonary: clear ant fields Abdomen: soft, nontender, + bowel  sounds Extremities: Bilateral lower extremity edema  Skin: no rashes, warm and dry Neurological: AAO x3, mood appropriate   Vital Signs:  BP 131/60 (BP Location: Right Arm)   Pulse 62   Temp 98.8 F (37.1 C) (Oral)   Resp 20   Ht '5\' 8"'  (1.727 m)   Wt 77.7 kg   SpO2 96%   BMI 26.05 kg/m  Pain Scale: 0-10   Pain Score: Asleep  SpO2: SpO2: 96 % O2 Device:SpO2: 96 % O2 Flow Rate: .O2 Flow Rate (L/min): 4 L/min  IO: Intake/output summary:  Intake/Output Summary (Last 24 hours) at 05/08/2021 1528 Last data filed at 05/08/2021 6834 Gross per 24 hour  Intake 1510 ml  Output 2600 ml  Net -1090 ml    LBM: Last BM Date: 05/05/21 Baseline Weight: Weight: 79.4 kg Most recent weight: Weight: 77.7 kg      Palliative Assessment/Data: PPS 30%   Advanced Care Planning:   Primary Decision Maker: NEXT OF KIN  Code Status/Advance Care Planning: DNR  A discussion was had today regarding advanced directives. Concepts specific to code status, artifical feeding and hydration, continued IV antibiotics and rehospitalization was had.    The difference between a aggressive medical intervention path and a palliative comfort care path was discussed.   Hospice  services outpatient were explained and offered. Patient verbalized understanding and awareness  hospice's goals and philosophy of care.   Mr. Scarpelli is clear in his expressed wishes that he is not interested in dialysis. He wishes to continue to treat the treatable while hospitalized with no escalation in his care, no aggressive interventions, no escalation in his care. His main focus is on his comfort and spending what time he has left hopefully with no suffering and decent quality.    Assessment & Plan:   SUMMARY OF RECOMMENDATIONS   DNR/DNI-as requested and confirmed by patient.  No dialysis, no artificial feeding, no escalation in care, treat the treatable with a goal focused on comfort. Patient expresses he is open to hospice  support. He knows he is to weak to return home alone. He inquires about his ability to be considered for residential hospice. He would like for [place] to have a phone conference with his brother and offer support explaining his wishes and decisions. We will plan to contact brother on tomorrow.  PMT will continue to support and follow as needed. Please call with urgent needs.  Symptom Management:  Pain-Oxy IR as needed Nausea-Zofran Constipation-Miralax   Palliative Prophylaxis:  Bowel Regimen and Frequent Pain Assessment  Additional  Recommendations (Limitations, Scope, Preferences): No Artificial Feeding, No Hemodialysis, No Surgical Procedures, No Tracheostomy, and DNR/DNI  Psycho-social/Spiritual:  Desire for further Chaplaincy support: no Additional Recommendations: Education on Hospice and ongoing goals of care   Prognosis:  POOR   Discharge Planning:  To Be Determined    Patient expressed understanding and was in agreement with this plan.   Time Total: 70 min.   Visit consisted of counseling and education dealing with the complex and emotionally intense issues of symptom management and palliative care in the setting of serious and potentially life-threatening illness.Greater than 50%  of this time was spent counseling and coordinating care related to the above assessment and plan.  Signed by:  Alda Lea, AGPCNP-BC Palliative Medicine Team  Phone: [phone] Pager: [phone] Amion: Bjorn Pippin   Thank you for allowing the Palliative Medicine Team to assist in the care of this patient. Please utilize secure chat with additional questions, if there is no response within 30 minutes please call the above phone number. Palliative Medicine Team providers are available by phone from 7am to 5pm daily and can be reached through the team cell phone.  Should this patient require assistance outside of these hours, please call the patient's attending  physician.

## 2021-05-08 NOTE — Progress Notes (Signed)
Pt states he will call when he is ready for his CPAP.

## 2021-05-09 DIAGNOSIS — J9611 Chronic respiratory failure with hypoxia: Secondary | ICD-10-CM | POA: Diagnosis not present

## 2021-05-09 DIAGNOSIS — I5033 Acute on chronic diastolic (congestive) heart failure: Secondary | ICD-10-CM | POA: Diagnosis not present

## 2021-05-09 DIAGNOSIS — I421 Obstructive hypertrophic cardiomyopathy: Secondary | ICD-10-CM | POA: Diagnosis not present

## 2021-05-09 LAB — GLUCOSE, CAPILLARY
Glucose-Capillary: 193 mg/dL — ABNORMAL HIGH (ref 70–99)
Glucose-Capillary: 141 mg/dL — ABNORMAL HIGH (ref 70–99)
Glucose-Capillary: 160 mg/dL — ABNORMAL HIGH (ref 70–99)
Glucose-Capillary: 134 mg/dL — ABNORMAL HIGH (ref 70–99)

## 2021-05-09 LAB — BASIC METABOLIC PANEL
Anion gap: 7 (ref 5–15)
BUN: 84 mg/dL — ABNORMAL HIGH (ref 8–23)
CO2: 28 mmol/L (ref 22–32)
Calcium: 8.4 mg/dL — ABNORMAL LOW (ref 8.9–10.3)
Chloride: 100 mmol/L (ref 98–111)
Creatinine, Ser: 3.59 mg/dL — ABNORMAL HIGH (ref 0.61–1.24)
GFR, Estimated: 17 mL/min — ABNORMAL LOW (ref >60)
Glucose, Bld: 227 mg/dL — ABNORMAL HIGH (ref 70–99)
Potassium: 4.3 mmol/L (ref 3.5–5.1)
Sodium: 135 mmol/L (ref 135–145)

## 2021-05-09 MED ORDER — FUROSEMIDE 10 MG/ML IJ SOLN
80.0000 mg | Freq: Three times a day (TID) | INTRAMUSCULAR | Status: DC
  Administered 2021-05-09 – 2021-05-16 (×20): 80 mg via INTRAVENOUS
  Filled 2021-05-09 (×22): qty 8

## 2021-05-09 NOTE — Progress Notes (Signed)
Daily Progress Note   Patient Name: Robert Chaney       Date: 05/09/2021 DOB: 12/24/1950  Age: 70 y.o. MRN#: [id number] Attending Physician: Kayleen Memos, DO Primary Care Physician: [name], MD Admit Date: 05/01/2021  Reason for Consultation/Follow-up: Establishing goals of care, Non pain symptom management, Pain control, and Psychosocial/spiritual support  Subjective: Chart Reviewed. Updates Received. Patient Assessed.   Robert Chaney is sitting up in the recliner. No acute distress. Continues to endorse shortness of breath, rectal pain, and generalized body aches. He is tearful expressing he does not feel that his health is getting much better. His appetite remains fair but overall he feels tired and weak. Emotional support provided.   We reviewed goals of care. Robert Chaney confirms his wishes to continue to medically manage while hospitalized with no escalation in care. His goal is to return home with family support and outpatient hospice to allow him to spend what time he has left.   As planned we called patient's brother, Robert Chaney and had phone conference. Detailed updates provided to family. Brother expressed understanding of patient's condition and prognosis. He is in agreement with Robert Chaney for no dialysis and to focus on his comfort. He does not want his brother to suffer and knows that the past few months have been a challenge for him.   We discussed options for outpatient hospice support however with emphasis given patient's condition it is not safe for him to be alone. Robert Chaney verbalized understanding. He shared his wife is also a Marine scientist and they have been in discussion regarding Robert Chaney's care. Brother states his brother's wishes are to be in the home with his family if at all possible and wants to try to support him as much as he can. He plans to come down tomorrow no later than Friday and stay with patient for as long as he needs him. He will come to the hospital once he arrives. Once  he has come and cleaned up patient's home he would then be prepared for patient to return home. Patient and brother are in agreement for hospice support in the home. They are hopeful patient can return home by the weekend as Robert Chaney states he is ready to go knowing he has not shown a significant improvement. He is also concerned about getting his monthly bills paid.   Education provided on hospice goals and philosophy of care. Patient and brother also aware if patient becomes more appropriate for residential hospice facility or symptoms become a challenge to manage in the home his outpatient hospice team will be available to support and assist with possible transfer to their facility.   All questions answered and support provided.    Length of Stay: 8 days  Vital Signs: BP (!) 147/61 (BP Location: Right Arm)   Pulse (!) 58   Temp 98.5 F (36.9 C) (Oral)   Resp 16   Ht 5\' 8"  (1.727 m)   Wt 78.3 kg   SpO2 98%   BMI 26.25 kg/m  SpO2: SpO2: 98 % O2 Device: O2 Device: Nasal Cannula O2 Flow Rate: O2 Flow Rate (L/min): 2 L/min  Physical Exam: NAD, chronically-ill RRR Diminished Weak, AAAx3               Palliative Care Assessment & Plan    Code Status: DNR  Goals of Care/Recommendations: Treat the treatable while hospitalized, no escalation Family meeting on today with patient and his brother. They are clear in expressed wishes for no aggressive  interventions, no dialysis with a goal of patient returning home with support of his brother to spend what time he has left. They are requesting outpatient hospice support. Brother plans to arrive to [facility] Thursday no later than Friday. He is planning to stay with patient as long as needed.  Outpatient hospice support at discharge. (Will place TOC referral) Oxy IR as needed for pain (received 3 doses over past 24 hrs) will consider long-acting pain relief if continues to show signs of consistent pain.   Miralax daily  PMT will continue to  support and follow.  Prognosis: Guarded-Poor   Discharge Planning: To Be Determined  Thank you for allowing the Palliative Medicine Team to assist in the care of this patient.  Time Total: 50 min.   Visit consisted of counseling and education dealing with the complex and emotionally intense issues of symptom management and palliative care in the setting of serious and potentially life-threatening illness.Greater than 50%  of this time was spent counseling and coordinating care related to the above assessment and plan.  Alda Lea, AGPCNP-BC  Palliative Medicine Team [phone]

## 2021-05-09 NOTE — Progress Notes (Signed)
Patient agrees to call for assistance when he is ready for nocturnal CPAP.

## 2021-05-09 NOTE — Progress Notes (Addendum)
Progress Note  Patient Name: Robert Chaney Date of Encounter: 05/09/2021  [facility] Cardiologist: Skeet Latch, MD   Subjective   No acute overnight events. Patient does not feel like he has made much progress this admission. Continues to have shortness of breath. Also continues to report occasional mild chest pain that occurs randomly but this is stable. His biggest complaint this morning is rectal pain.  Inpatient Medications    Scheduled Meds:  amLODipine  10 mg Oral Daily   atorvastatin  20 mg Oral Daily   brimonidine  1 drop Right Eye BID   carvedilol  25 mg Oral BID WC   docusate sodium  200 mg Oral BID   dorzolamide  1 drop Both Eyes BID   doxazosin  2 mg Oral QHS   enoxaparin (LOVENOX) injection  30 mg Subcutaneous Q24H   hydrALAZINE  100 mg Oral Q8H   insulin aspart  0-15 Units Subcutaneous TID AC & HS   insulin glargine-yfgn  10 Units Subcutaneous Daily   isosorbide mononitrate  90 mg Oral Daily   lactose free nutrition  237 mL Oral TID WC   latanoprost  1 drop Left Eye QHS   multivitamin with minerals  1 tablet Oral Daily   pantoprazole  40 mg Oral BID AC   Continuous Infusions:  furosemide 100 mg (05/09/21 0517)   PRN Meds: acetaminophen **OR** acetaminophen, albuterol, butamben-tetracaine-benzocaine, lidocaine, nitroGLYCERIN, ondansetron **OR** ondansetron (ZOFRAN) IV, oxyCODONE, polyethylene glycol   Vital Signs    Vitals:   05/08/21 1203 05/08/21 2114 05/09/21 0352 05/09/21 0516  BP: 131/60 (!) 123/54 140/63 (!) 158/81  Pulse: 62 60 (!) 57   Resp: 20 18 16    Temp:  98.3 F (36.8 C) 98.4 F (36.9 C)   TempSrc:  Oral Oral   SpO2: 96% 92% 100%   Weight:   78.3 kg   Height:        Intake/Output Summary (Last 24 hours) at 05/09/2021 0734 Last data filed at 05/09/2021 0514 Gross per 24 hour  Intake 480 ml  Output 2250 ml  Net -1770 ml   Last 3 Weights 05/09/2021 05/08/2021 05/07/2021  Weight (lbs) 172 lb 9.9 oz 171 lb 4.8 oz 175 lb  14.8 oz  Weight (kg) 78.3 kg 77.7 kg 79.8 kg      Telemetry    Sinus rhythm with rates in the 50s to 60s. - Personally Reviewed  ECG    No new ECG tracing today. - Personally Reviewed  Physical Exam   GEN: No acute distress.   Neck: No JVD. Cardiac: RRR. II-III/VI systolic murmur. No rubs or gallops. Radial pulses 2+ and equal bilaterally. Respiratory: Clear to auscultation bilaterally. Soft rhonchi noted in bases. No significant crackles appreciated. GI: Soft, non-tender, and non-distended. Bowel sounds present. MS: 1+ pitting edema of bilateral lower extremities. No deformity. Skin: Warm and dry. Neuro:  No focal deficits. Psych: Normal affect. Responds appropriately.  Labs    High Sensitivity Troponin:   Recent Labs  Lab 05/01/21 1713 05/01/21 2019 05/02/21 0510  TROPONINIHS 113* 114* 94*     Chemistry Recent Labs  Lab 05/06/21 0525 05/07/21 0434 05/08/21 0423  NA 136 137 135  K 4.4 4.9 4.6  CL 105 104 101  CO2 27 27 27   GLUCOSE 80 92 93  BUN 65* 76* 75*  CREATININE 3.36* 3.78* 3.62*  CALCIUM 8.0* 8.1* 8.3*  MG 2.2 2.5* 2.4  GFRNONAA 19* 16* 17*  ANIONGAP 4* 6 7  Lipids No results for input(s): CHOL, TRIG, HDL, LABVLDL, LDLCALC, CHOLHDL in the last 168 hours.  Hematology Recent Labs  Lab 05/05/21 0522 05/06/21 0525 05/07/21 0434  WBC 3.3* 3.3* 3.6*  RBC 3.03* 2.60* 2.53*  HGB 9.0* 7.9* 7.6*  HCT 29.7* 25.5* 24.8*  MCV 98.0 98.1 98.0  MCH 29.7 30.4 30.0  MCHC 30.3 31.0 30.6  RDW 18.3* 18.2* 17.9*  PLT 175 164 167   Thyroid No results for input(s): TSH, FREET4 in the last 168 hours.  BNPNo results for input(s): BNP, PROBNP in the last 168 hours.  DDimer No results for input(s): DDIMER in the last 168 hours.   Radiology    No results found.  Cardiac Studies   Limited Echo 05/02/2021: Impressions:  1. Left ventricular ejection fraction, by estimation, is 60 to 65%. The  left ventricle has normal function. The left ventricle has no  regional  wall motion abnormalities. There is severe left ventricular hypertrophy.  Left ventricular diastolic parameters   are consistent with Grade II diastolic dysfunction (pseudonormalization).  The average left ventricular global longitudinal strain is -11.1 %. The  global longitudinal strain is abnormal, there is relative preservation of  strain at the apex (bullseye  appearance). No LV outflow tract gradient.   2. Right ventricular systolic function is mildly reduced. The right  ventricular size is normal. Mildly increased right ventricular wall  thickness. Tricuspid regurgitation signal is inadequate for assessing PA  pressure.   3. The mitral valve is normal in structure, no systolic anterior motion.  Trivial mitral valve regurgitation. No evidence of mitral stenosis.   4. The aortic valve is tricuspid. Aortic valve regurgitation is trivial.  No aortic stenosis is present.   5. The inferior vena cava is dilated in size with >50% respiratory  variability, suggesting right atrial pressure of 8 mmHg.   6. The patient carries a diagnosis of hypertrophic cardiomyopathy.  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   Patient Profile     70 y.o. 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 Cyd Silence.   Assessment & Plan    Acute on Chronic Hypoxic Respiratory Failure  Acute on Chronic Diastolic CHF - Patient has chronic hypoxic respiratory failure and is on 4L of O2 at home but presented with progressive dyspnea on  exertion with orthopnea and intermittent exertional chest pain. - BNP elevated at 3,367.  - Chest x-ray and CT suggestive of CHF with bilateral pleural effusions as well as concern for RUL pneumonia. However, procalcitonin was low so antibiotics have not been started.  - Echo this admission showed LVEF of 60-65 with normal wall motion, severe LVH, and grade 2 diastolic dysfunction. No LVOT gradient.  - Diuresis has been challenging due renal function. Currently on IV Lasix 100mg  three times daily which is being managed by Nephrology. Documented urinary output of 2.25 L yesterday but only net negative 395 mL this admission. Weight only down 3 lbs from admission. Today's BMET pending. - Nephrology has signed off given he is not interested in dialysis and is now DNR.  They recommended continue IV Lasix 80mg  2-3 times per day for comfort as needed. Therefore, will decrease IV Lasix to 80mg  three times daily. - Continue to monitor daily weights, strict I/Os, and renal function with diuresis.  - Overall poor prognosis with limited options. Palliative Care has seen the patient and sounds like plan is likely for hospice but conversation are still being had.  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 - Palliative care discussion pending. Can consider further work-up as outpatient if within [place]. However, sounds like patient is leaning towards hospice.  Chest Pain Elevated Troponin - Patient has history of coronary artery calcifications on prior CT. Nuclear stress test in 2019 showed no ischemia. However, patient has never had  deficitive ischemic work-up due to CKD stage IV.  Patient continues to report intermittent chest pain that occurs randomly but stable and does seem better after increase in Imdur. - High-sensitivity troponin mildly elevated and flat at 113 >>114 >>94. Not consistent with ACS. - Echo showed normal LV function and wall motion.  - Current home antianginal regimen: Amlodipine 10mg  daily, Coreg 25mg  twice daily, and Imdur 60mg  daily. Imdur was increased to 90mg  on 11/10. Continue current regimen. - Not a good cardiac catheterization candidate given CKD stage IV and other comorbidities.  Hypertensive Emergency - BP 232/141 on admission. Historically, BP has been very difficult to control despite multiple medications.  - Current home regimen: Amlodipine 10mg  daily, Coreg 25mg  twice daily, Hydralazine 100 thee times daily, and Imdur 60mg  daily. Doxazosin 2mg  daily was added this admission and Imdur was increased to 90mg  daily. - BP still mildly elevated at times but much improved compared to where it was on admission. Continue current regimen.   Hyperlipidemia - LDL 63 in 04/2020.  - Continue Lipitor 20mg  daily.   Type 2 Diabetes Mellitus - Hemoglobin A1c 5.9 on 04/03/2021. - Management per primary team.   CKD Stage IV - Creatinine was 2.4 on admission and peaked at 3.78 on 11/14. Baseline around 2.5 to 3.0. Today's labs pending. - Continue to monitor closely. - Nephrology consulted but looks like they have now signed off.   Otherwise, per primary team: - Rectal cancer - GERD - Glaucoma - Chronic hypoxic respiratory failure on 4L at home  For questions or updates, please contact [facility] Please consult [email or web address] for contact info under        Signed, Darreld Mclean, PA-C  05/09/2021, 7:34 AM    Patient seen and examined and agree with Sande Rives as detailed above.  In brief, the patient is a 70 y.o. male with a history of hypertrophic cardiomyopathy without LVOT  obstruction, chronic diastolic CHF, hypertension, hyperlipidemia, type 2 diabetes mellitus, chronic hypoxic respiratory failure on 4L of O2 at home, obstructive sleep apnea on CPAP, CKD stage IV, anemia, and recent diagnosis of rectal cancer in 03/2021 being treated with radiation who presented with worsening SOB and orthopnea found to have acute on chronic diastolic HF for which Cardiology has been consulted.   Unfortunately, the patient has not been responding well to diuresis and had rising Cr despite being volume overloaded. Given limited options and overall poor prognosis, the  patient has decided not to proceed with escalation of care and declined HD. Goal is to focus mainly on comfort at this time.  GEN: Sitting in the chair, comfortable Neck: Supple Cardiac: RR, 2/6 systolic murmur, no rubs Respiratory: Crackles at the bases bilaterally GI: Soft, nontender, non-distended  MS: Trace-1+ edema bilaterally; warm Neuro:  Nonfocal  Psych: Normal affect   Plan: -Per discussion with palliative care, no escalation of care and patient declined HD. He is leaning more towards comfort at this time -Continue lasix 80mg  IV TID per renal -Continue current antihypertensive regimen: amlodipine 10mg  daily, coreg 25mg  BID, imdur 90mg , hydralazine 100mg  TID, and doxazosin 2mg  daily -Can adjust cardiac meds as needed pending if patient transitions to hospice  Cardiology will sign-off at this time.  Gwyndolyn Kaufman, MD

## 2021-05-09 NOTE — TOC Progression Note (Addendum)
Transition of Care [facility]) - Progression Note    Patient Details  Name: Robert Chaney MRN: [id number] Date of Birth: [birth year]  [facility] CM/SW Contact  Lolitha Tortora, Juliann Pulse, RN Phone Number: 05/09/2021, 10:42 AM  Clinical Narrative: Noted PT to return to work with patient;PMT [place] discussions ongoing,noted open to otpt hospice. Currently has [place] following for HHRN/CSW/aide,on chronic 02-[facility]. Will update [place] closer to d/c with d/c plans rep [id number] [phone].      Expected Discharge Plan: Home/Self Care Barriers to Discharge: Continued Medical Work up  Expected Discharge Plan and Services Expected Discharge Plan: Home/Self Care   Discharge Planning Services: CM Consult   Living arrangements for the past 2 months: Single Family Home                                       Social Determinants of Health (SDOH) Interventions    Readmission Risk Interventions Readmission Risk Prevention Plan 05/04/2021 04/11/2021 03/22/2021  Transportation Screening Complete Complete Complete  PCP or Specialist Appt within 3-5 Days - - -  HRI or [place] - - -  Social Work Consult for [place] - - -  Medication Review [employer] photographer) Complete Complete Complete  PCP or Specialist appointment within 3-5 days of discharge Complete Complete Complete  HRI or Home Care Consult Complete Complete Complete  SW Recovery Care/Counseling Consult Complete Complete Complete  Palliative Care Screening Not Applicable Not Applicable Not [place] Not Applicable Not Applicable Not Applicable  Some recent data might be hidden

## 2021-05-09 NOTE — Progress Notes (Signed)
Progress Note    Robert Chaney   ZOX:[id number]  DOB: Apr 18, 1951  DOA: 05/01/2021     8 PCP: [name], MD  Initial CC: Shortness of breath, chest pressure  Hospital Course: Mr. 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  He was referred to the ER from the cancer center.  His dyspnea was worse with minimal exertion and improved with rest.  He denied any PND or orthopnea. On work-up he was found to be significantly hypertensive, 232/141 with mildly elevated troponin, 113 and elevated BNP. CXR showed pulmonary edema and patient was started on Lasix as well as IV labetalol and his medications were resumed. He was admitted for diuresis and further blood pressure control. Renal function did not tolerate diuresis.  He underwent evaluation with palliative care due to his decline and lack of improvement.  Patient elected for foregoing hemodialysis and instead focusing on pursuing more of a hospice/palliative route.   Interval History:  Minimally interactive this morning.  Assessment & Plan: * Acute on chronic diastolic CHF (congestive heart failure) (HCC) - patient presenting with symptoms of worsening shortness of breath and orthopnea - also noted to have pulmonary edema, B/L LE edema, DOE, pleural effusions -Cardiology also consulted on admission for assistance as well as in setting of indeterminate troponin - diuresis (lasix held starting 11/11 due to increase in creatinine) and blood pressure management; lasix resumed on 11/14 (renal function labile) -limited echo repeated on admission: EF 60-65%, Gr II DD - unfortunate circumstance with limited ability to diurese in setting of brittle CKD4; patient met with palliative care on 11/14 and decided to avoid HD  and instead was interested on focusing on comfort driven care (okay continuing lasix and other meds if helping but did not wish to escalate care); follow up formal palliative care eval   GERD without esophagitis - continue PPI  Glaucoma - Continue home eyedrops  Mixed hyperlipidemia - continue statin    Rectal adenocarcinoma ([place]) - Recent diagnosis of rectal adenocarcinoma in 03/2021 - Patient has recently been established with Dr. [name] - Receiving radiation therapy in the outpatient setting -Cetacaine spray working well, patient requesting this at discharge.  Also continue as needed oxycodone - does not appear to be a candidate for chemo or surgery due to cardiopulm status; decision of patient to pursue HD would not change cancer management per oncology   Chronic respiratory failure with hypoxia ([place]) - on chronic 4L [place] O2 at home  Type 2 diabetes mellitus with stage 4 chronic kidney disease, with long-term current use of insulin (HCC) - Hemoglobin A1C performed in October was 5.9% - continue diabetic diet  OSA on CPAP - CPAP QHS per home regimen  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[place]) - patient has carried this diagnosis previously; repeat echo this admission showing a "global longitudinal strain". This appears to raise some consideration that underlying amyloid may be playing a factor - cardiology following; patient will have further testing likely outpt with possible PYP scan; follow up SPEP/UPEP as well   Acute renal failure superimposed on stage 4 chronic kidney disease ([place]) - patient has history of CKD4. Baseline creat ~ 2.7 - 2.8, eGFR 21-24 - some worsening of renal function on 11/11; most likely was overdiuresis but also at  risk for ATN -Appreciate nephrology evaluation.  SPEP/UPEP ordered as well - see palliative care discussion; patient declining HD - okay for lasix 80 mg IV 2-3 times a day if provides comfort  Hypertensive crisis-resolved as of  05/04/2021 - Patient presenting with marked markedly elevated blood pressures as high as 232/141 - Presence of acute cardiogenic pulmonary edema and elevated troponin with chest pain are concerning for acute hypertensive emergency - s/p NTG and labetalol; restarted on home meds also - BP responded but still above goal (imdur increased to 90 mg daily on 11/10 per cardiology) - continue amlodipine, Coreg, doxazosin, hydralazine, Imdur  Elevated troponin level not due myocardial infarction-resolved as of 05/04/2021 - Patient presenting with elevated troponin of 113. Likely enzyme leak in setting of hypertensive crisis - denies CP but did have DOE and chest pressure with deep inspiration - no large uptrend with trending - no plans for ischemic evaluation especially given patient's decision to avoid further escalation      Old records reviewed in assessment of this patient  Antimicrobials:   DVT prophylaxis: Lovenox  Code Status:   Code Status: DNR  Disposition Plan:  Status is: Inpatient   Objective: Blood pressure (!) 147/61, pulse (!) 58, temperature 98.5 F (36.9 C), temperature source Oral, resp. rate 16, height '5\' 8"'  (1.727 m), weight 78.3 kg, SpO2 98 %.  Examination:  Physical Exam Constitutional:      Appearance: Normal appearance.  HENT:     Head: Normocephalic and atraumatic.     Mouth/Throat:     Mouth: Mucous membranes are moist.  Eyes:     Extraocular Movements: Extraocular movements intact.  Cardiovascular:     Rate and Rhythm: Normal rate and regular rhythm.  Pulmonary:     Effort: Pulmonary effort is normal.     Breath sounds: Normal breath sounds.  Abdominal:     General: Bowel sounds are normal. There is no distension.     Palpations: Abdomen is soft.     Tenderness: There is no abdominal tenderness.  Musculoskeletal:        General: Swelling present. Normal range of motion.     Cervical back: Normal range of motion and neck supple.     Comments: B/L  LE edema, 3+ to upper shin; slight improvement in left leg today  Skin:    General: Skin is warm and dry.  Neurological:     General: No focal deficit present.     Mental Status: He is alert.  Psychiatric:        Mood and Affect: Mood normal.        Behavior: Behavior normal.     Consultants:  Cardiology Nephrology  Palliative care  Procedures:    Data Reviewed: I have personally reviewed labs and imaging studies     LOS: 8 days  Time spent: Greater than 50% of the 35 minute visit was spent in counseling/coordination of care for the patient as laid out in the A&P.   Kayleen Memos, MD [facility] 05/09/2021, 6:19 PM

## 2021-05-09 NOTE — Evaluation (Signed)
Physical Therapy Evaluation Only Patient Details Name: Robert Chaney MRN: [id number] DOB: [birth year] Today's Date: 05/09/2021  History of Present Illness  Robert Chaney is a 70  yo male who presents with worsening SOB and nausea. PMH: rectal adenocarcinoma (dx CLN 03/2021), chronic hypoxic respiratory failure (4L at home), IDDM, HLD, HTN, CKD4, GERD, dCHF, HCM w/o LVOT obstruction, OSA on CPAP, glaucoma   Clinical Impression  Pt initially hesitant to mobilize, irritated due to being cold and wanting food tray but agreeable to participate with therapy when educated. Pt transfers and ambulates with RW, therapist rolling O2 tank, modified independent no dyspnea noted and pt denies any SOB, chest pain, lightheadedness or any pain at all. Pt on 4L O2 with SpO2 93-99% with ambulation. Educated pt on time OOB and ambulating with nursing as tolerable to continue getting steps in daily and pt in agreement. No acute PT needs identified, will sign off at this time.     Recommendations for follow up therapy are one component of a multi-disciplinary discharge planning process, led by the attending physician.  Recommendations may be updated based on patient status, additional functional criteria and insurance authorization.  Follow Up Recommendations No PT follow up    Assistance Recommended at Discharge    Functional Status Assessment    Equipment Recommendations  None recommended by PT    Recommendations for Other Services       Precautions / Restrictions Precautions Precautions: None Restrictions Weight Bearing Restrictions: No      Mobility  Bed Mobility  General bed mobility comments: pt OOB in recliner    Transfers Overall transfer level: Modified independent Equipment used: Rolling walker (2 wheels) Transfers: Sit to/from Stand   Ambulation/Gait Ambulation/Gait assistance: Modified independent (Device/Increase time) Gait Distance (Feet): 200 Feet Assistive device: Rolling walker (2  wheels) Gait Pattern/deviations: WFL(Within Functional Limits) Gait velocity: WFL  General Gait Details: pt ambulates with RW, completes direction turns and head turns without veering or LOB, on 4L with SpO2 93-99%, no dyspnea noted and pt denies SOB, lightheaded, chest pain or any pain  Stairs            Wheelchair Mobility    Modified Rankin (Stroke Patients Only)       Balance Overall balance assessment: No apparent balance deficits (not formally assessed)       Pertinent Vitals/Pain Pain Assessment: No/denies pain    Home Living Family/patient expects to be discharged to:: Private residence Living Arrangements: Alone Available Help at Discharge: Friend(s);Available 24 hours/day Type of Home: Apartment Home Access: Level entry       Home Layout: One level Home Equipment: Conservation officer, nature (2 wheels);Shower seat Additional Comments: 4 L O2 24/7 baseline    Prior Function Prior Level of Function : Independent/Modified Independent  Mobility Comments: pt reports independent with ambulation, somtimes uses RW ADLs Comments: pt reports independent with ADLs/IADLs     Hand Dominance   Dominant Hand: Right    Extremity/Trunk Assessment   Upper Extremity Assessment Upper Extremity Assessment: Overall WFL for tasks assessed    Lower Extremity Assessment Lower Extremity Assessment: Overall WFL for tasks assessed (AROM WNL, strength 4/5, denies numbness/tingling, symmetrical)    Cervical / Trunk Assessment Cervical / Trunk Assessment: Normal  Communication   Communication: No difficulties  Cognition Arousal/Alertness: Awake/alert Behavior During Therapy: WFL for tasks assessed/performed Overall Cognitive Status: Within Functional Limits for tasks assessed  General Comments: pt alert and irritable regarding being cold and wanting food- RN in room assuring pt  food tray will be ordered and delivered        General Comments General comments (skin integrity,  edema, etc.): Pt on 4L O2 with SpO2 >92%, denies SOB and no dyspnea noted    Exercises     Assessment/Plan    PT Assessment Patient does not need any further PT services  PT Problem List Cardiopulmonary status limiting activity       PT Treatment Interventions      PT Goals (Current goals can be found in the Care Plan section)  Acute Rehab PT Goals Patient Stated Goal: walk with therapy PT Goal Formulation: All assessment and education complete, DC therapy    Frequency     Barriers to discharge        Co-evaluation               AM-PAC PT "6 Clicks" Mobility  Outcome Measure Help needed turning from your back to your side while in a flat bed without using bedrails?: None Help needed moving from lying on your back to sitting on the side of a flat bed without using bedrails?: None Help needed moving to and from a bed to a chair (including a wheelchair)?: None Help needed standing up from a chair using your arms (e.g., wheelchair or bedside chair)?: None Help needed to walk in hospital room?: None Help needed climbing 3-5 steps with a railing? : A Little 6 Click Score: 23    End of Session   Activity Tolerance: Patient tolerated treatment well Patient left: in chair;with call bell/phone within reach;with chair alarm set Nurse Communication: Mobility status PT Visit Diagnosis: Difficulty in walking, not elsewhere classified (R26.2)    Time: 1450-1516 PT Time Calculation (min) (ACUTE ONLY): 26 min   Charges:   PT Evaluation $PT Eval Low Complexity: 1 Low PT Treatments $Gait Training: 8-22 mins         Robert Chaney PT, DPT 05/09/21, 4:00 PM

## 2021-05-10 ENCOUNTER — Inpatient Hospital Stay: Payer: PPO | Admitting: Nurse Practitioner

## 2021-05-10 DIAGNOSIS — J9611 Chronic respiratory failure with hypoxia: Secondary | ICD-10-CM | POA: Diagnosis not present

## 2021-05-10 DIAGNOSIS — I5033 Acute on chronic diastolic (congestive) heart failure: Secondary | ICD-10-CM | POA: Diagnosis not present

## 2021-05-10 DIAGNOSIS — I421 Obstructive hypertrophic cardiomyopathy: Secondary | ICD-10-CM | POA: Diagnosis not present

## 2021-05-10 LAB — RENAL FUNCTION PANEL
Albumin: 2.8 g/dL — ABNORMAL LOW (ref 3.5–5.0)
Anion gap: 7 (ref 5–15)
BUN: 84 mg/dL — ABNORMAL HIGH (ref 8–23)
CO2: 30 mmol/L (ref 22–32)
Calcium: 8.4 mg/dL — ABNORMAL LOW (ref 8.9–10.3)
Chloride: 98 mmol/L (ref 98–111)
Creatinine, Ser: 4.09 mg/dL — ABNORMAL HIGH (ref 0.61–1.24)
GFR, Estimated: 15 mL/min — ABNORMAL LOW (ref >60)
Glucose, Bld: 125 mg/dL — ABNORMAL HIGH (ref 70–99)
Phosphorus: 5.1 mg/dL — ABNORMAL HIGH (ref 2.5–4.6)
Potassium: 4.9 mmol/L (ref 3.5–5.1)
Sodium: 135 mmol/L (ref 135–145)

## 2021-05-10 LAB — GLUCOSE, CAPILLARY
Glucose-Capillary: 127 mg/dL — ABNORMAL HIGH (ref 70–99)
Glucose-Capillary: 182 mg/dL — ABNORMAL HIGH (ref 70–99)
Glucose-Capillary: 165 mg/dL — ABNORMAL HIGH (ref 70–99)
Glucose-Capillary: 181 mg/dL — ABNORMAL HIGH (ref 70–99)

## 2021-05-10 LAB — CBC
HCT: 25.3 % — ABNORMAL LOW (ref 39.0–52.0)
Hemoglobin: 7.8 g/dL — ABNORMAL LOW (ref 13.0–17.0)
MCH: 30 pg (ref 26.0–34.0)
MCHC: 30.8 g/dL (ref 30.0–36.0)
MCV: 97.3 fL (ref 80.0–100.0)
Platelets: 200 10*3/uL (ref 150–400)
RBC: 2.6 MIL/uL — ABNORMAL LOW (ref 4.22–5.81)
RDW: 17.2 % — ABNORMAL HIGH (ref 11.5–15.5)
WBC: 3.9 10*3/uL — ABNORMAL LOW (ref 4.0–10.5)
nRBC: 0 % (ref 0.0–0.2)

## 2021-05-10 LAB — MAGNESIUM: Magnesium: 2.4 mg/dL (ref 1.7–2.4)

## 2021-05-10 MED ORDER — OXYCODONE HCL 5 MG PO TABS
5.0000 mg | ORAL_TABLET | ORAL | Status: DC | PRN
Start: 2021-05-10 — End: 2021-05-10

## 2021-05-10 MED ORDER — OXYCODONE HCL 5 MG PO TABS
5.0000 mg | ORAL_TABLET | Freq: Four times a day (QID) | ORAL | Status: DC | PRN
Start: 2021-05-10 — End: 2021-05-16
  Administered 2021-05-11 (×2): 10 mg via ORAL
  Filled 2021-05-10 (×2): qty 2

## 2021-05-10 MED ORDER — POLYETHYLENE GLYCOL 3350 17 G PO PACK
17.0000 g | PACK | Freq: Every day | ORAL | Status: DC
  Administered 2021-05-11 – 2021-05-16 (×6): 17 g via ORAL
  Filled 2021-05-10 (×7): qty 1

## 2021-05-10 NOTE — Progress Notes (Signed)
Patient states he will call when ready for nocturnal CPAP

## 2021-05-10 NOTE — Care Management Important Message (Signed)
Important Message  Patient Details IM Letter placed in Patients room. Name: Robert Chaney MRN: [id number] Date of Birth: [birth year]   Medicare Important Message Given:  Yes     Kerin Salen 05/10/2021, 11:57 AM

## 2021-05-10 NOTE — Progress Notes (Signed)
PROGRESS NOTE  Robert Chaney MQK:[id number] DOB: 1951-05-14 DOA: 05/01/2021 PCP: [name], MD  HPI/Recap of past 24 hours:  [name] is a 70  yo male with PMH rectal adenocarcinoma (dx CLN 03/2021), chronic hypoxic respiratory failure (4L at home), IDDM, HLD, HTN, CKD4, GERD, dCHF, HCM w/o LVOT obstruction, OSA on CPAP, glaucoma who presented to [facility] with worsening shortness of breath and nausea.  He was referred to the ER from the cancer center.  His dyspnea was worse with minimal exertion and improved with rest.  He denied any PND or orthopnea. On work-up he was found to be significantly hypertensive, 232/141 with mildly elevated troponin, 113 and elevated BNP. CXR showed pulmonary edema and started on Lasix as well as IV labetalol and his home antihypertensives were resumed. He was admitted for diuresis and further blood pressure control. Renal function did not tolerate diuresis.  He underwent evaluation with palliative care due to his decline and lack of improvement.  Patient elected for foregoing hemodialysis and instead focusing on pursuing more of a hospice/palliative route.   05/10/2021: Patient was seen and examined at his bedside.  States he feels better today.  Volume overload on exam.    Assessment/Plan: Principal Problem:   Acute on chronic diastolic CHF (congestive heart failure) (HCC) Active Problems:   Acute renal failure superimposed on stage 4 chronic kidney disease (HCC)   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HCC)   OSA on CPAP   Type 2 diabetes mellitus with stage 4 chronic kidney disease, with long-term current use of insulin (HCC)   Chronic respiratory failure with hypoxia (HCC)   Rectal adenocarcinoma (HCC)   Mixed hyperlipidemia   Glaucoma   GERD without esophagitis  Acute on chronic diastolic CHF (congestive heart failure) ([place]) - patient presenting with symptoms of worsening shortness of breath and orthopnea - also noted to have pulmonary  edema, B/L LE edema, DOE, pleural effusions -Cardiology also consulted on admission for assistance as well as in setting of indeterminate troponin - diuresis (lasix held starting 11/11 due to increase in creatinine) and blood pressure management; lasix resumed on 11/14 (renal function labile) -limited echo repeated on admission: EF 60-65%, Gr II DD - unfortunate circumstance with limited ability to diurese in setting of brittle CKD4; patient met with palliative care on 11/14 and decided to avoid HD and instead was interested on focusing on comfort driven care (okay continuing lasix and other meds if helping but did not wish to escalate care); follow up formal palliative care eval    GERD without esophagitis - continue PPI   Glaucoma - Continue home eyedrops   Mixed hyperlipidemia - continue statin      Rectal adenocarcinoma ([place]) - Recent diagnosis of rectal adenocarcinoma in 03/2021 - Patient has recently been established with Dr. [name] - Receiving radiation therapy in the outpatient setting -Cetacaine spray working well, patient requesting this at discharge.  Also continue as needed oxycodone - does not appear to be a candidate for chemo or surgery due to cardiopulm status; decision of patient to pursue HD would not change cancer management per oncology    Chronic respiratory failure with hypoxia ([place]) - on chronic 4L [place] O2 at home   Type 2 diabetes mellitus with stage 4 chronic kidney disease, with long-term current use of insulin (HCC) - Hemoglobin A1C performed in October was 5.9% - continue diabetic diet   OSA on CPAP - CPAP QHS per home regimen   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[place]) -  patient has carried this diagnosis previously; repeat echo this admission showing a "global longitudinal strain". This appears to raise some consideration that underlying amyloid may be playing a factor - cardiology following; patient will have further testing likely outpt with  possible PYP scan; follow up SPEP/UPEP as well    Worsening nonoliguric acute renal failure superimposed on stage 4 chronic kidney disease ([place]) - patient has history of CKD4. Baseline creat ~ 2.7 - 2.8, eGFR 21-24 - some worsening of renal function on 11/11; most likely was overdiuresis but also at risk for ATN -Appreciate nephrology evaluation.  SPEP/UPEP ordered as well - see palliative care discussion; patient declining HD -Per nephrology okay for lasix 80 mg IV 2-3 times a day if provides comfort Creatinine uptrending, 4.09 with GFR 15, continue to follow.  Hyperphosphatemia secondary to worsening acute renal insufficiency Serum phosphorus 5.1   Resolved hypertensive crisis-resolved as of 05/04/2021 - Patient presenting with marked markedly elevated blood pressures as high as 232/141 - Presence of acute cardiogenic pulmonary edema and elevated troponin with chest pain are concerning for acute hypertensive emergency - s/p NTG and labetalol; restarted on home meds also - BP responded but still above goal (imdur increased to 90 mg daily on 11/10 per cardiology) -Continue amlodipine, Coreg, doxazosin, hydralazine, Imdur   Elevated troponin level not due myocardial infarction-resolved as of 05/04/2021 - Patient presenting with elevated troponin of 113. Likely enzyme leak in setting of hypertensive crisis - denies CP but did have DOE and chest pressure with deep inspiration - no large uptrend with trending - no plans for ischemic evaluation especially given patient's decision to avoid further escalation    Anemia of chronic disease Hemoglobin 7.8 No overt bleeding Continue to monitor     Old records reviewed in assessment of this patient   Antimicrobials:     DVT prophylaxis: Lovenox   Code Status:   Code Status: DNR   Disposition Plan:  Status is: Inpatient   Patient will require at least 2 midnights for further evaluation and treatment of present  condition.     Objective: Vitals:   05/10/21 0453 05/10/21 0534 05/10/21 0536 05/10/21 1202  BP: (!) 144/64  (!) 145/61 (!) 138/55  Pulse: 62   61  Resp: 20   20  Temp: 98.3 F (36.8 C)   97.7 F (36.5 C)  TempSrc: Oral   Oral  SpO2: 100%   100%  Weight:  78 kg    Height:        Intake/Output Summary (Last 24 hours) at 05/10/2021 1558 Last data filed at 05/10/2021 1100 Gross per 24 hour  Intake 1080 ml  Output 1470 ml  Net -390 ml   Filed Weights   05/08/21 0500 05/09/21 0352 05/10/21 0534  Weight: 77.7 kg 78.3 kg 78 kg    Exam:  General: 70 y.o. year-old male well developed well nourished in no acute distress.  Alert and oriented x3. Cardiovascular: Regular rate and rhythm with no rubs or gallops.  No thyromegaly or JVD noted.   Respiratory: Clear to auscultation with no wheezes or rales. Good inspiratory effort. Abdomen: Soft nontender nondistended with normal bowel sounds x4 quadrants. Musculoskeletal: 2+ pitting edema in lower extremities bilaterally.   Skin: No ulcerative lesions noted or rashes, Psychiatry: Mood is appropriate for condition and setting   Data Reviewed: CBC: Recent Labs  Lab 05/04/21 0429 05/05/21 0522 05/06/21 0525 05/07/21 0434 05/10/21 0717  WBC 3.7* 3.3* 3.3* 3.6* 3.9*  NEUTROABS 2.6 2.2 2.3  2.4  --   HGB 8.4* 9.0* 7.9* 7.6* 7.8*  HCT 27.5* 29.7* 25.5* 24.8* 25.3*  MCV 97.9 98.0 98.1 98.0 97.3  PLT 153 175 164 167 631   Basic Metabolic Panel: Recent Labs  Lab 05/05/21 0522 05/06/21 0525 05/07/21 0434 05/08/21 0423 05/09/21 0903 05/10/21 0717  NA 135 136 137 135 135 135  K 4.1 4.4 4.9 4.6 4.3 4.9  CL 102 105 104 101 100 98  CO2 '26 27 27 27 28 30  ' GLUCOSE 107* 80 92 93 227* 125*  BUN 62* 65* 76* 75* 84* 84*  CREATININE 3.52* 3.36* 3.78* 3.62* 3.59* 4.09*  CALCIUM 8.4* 8.0* 8.1* 8.3* 8.4* 8.4*  MG 2.2 2.2 2.5* 2.4  --  2.4  PHOS  --   --   --   --   --  5.1*   GFR: Estimated Creatinine Clearance: 15.8 mL/min (A)  (by C-G formula based on SCr of 4.09 mg/dL (H)). Liver Function Tests: Recent Labs  Lab 05/10/21 0717  ALBUMIN 2.8*   No results for input(s): LIPASE, AMYLASE in the last 168 hours. No results for input(s): AMMONIA in the last 168 hours. Coagulation Profile: No results for input(s): INR, PROTIME in the last 168 hours. Cardiac Enzymes: No results for input(s): CKTOTAL, CKMB, CKMBINDEX, TROPONINI in the last 168 hours. BNP (last 3 results) No results for input(s): PROBNP in the last 8760 hours. HbA1C: No results for input(s): HGBA1C in the last 72 hours. CBG: Recent Labs  Lab 05/09/21 1154 05/09/21 1656 05/09/21 2244 05/10/21 0756 05/10/21 1201  GLUCAP 141* 160* 134* 127* 182*   Lipid Profile: No results for input(s): CHOL, HDL, LDLCALC, TRIG, CHOLHDL, LDLDIRECT in the last 72 hours. Thyroid Function Tests: No results for input(s): TSH, T4TOTAL, FREET4, T3FREE, THYROIDAB in the last 72 hours. Anemia Panel: No results for input(s): VITAMINB12, FOLATE, FERRITIN, TIBC, IRON, RETICCTPCT in the last 72 hours. Urine analysis:    Component Value Date/Time   COLORURINE STRAW (A) 05/01/2021 2019   APPEARANCEUR CLEAR 05/01/2021 2019   LABSPEC 1.011 05/01/2021 2019   PHURINE 7.0 05/01/2021 2019   GLUCOSEU NEGATIVE 05/01/2021 2019   HGBUR SMALL (A) 05/01/2021 2019   BILIRUBINUR NEGATIVE 05/01/2021 2019   KETONESUR NEGATIVE 05/01/2021 2019   PROTEINUR >=300 (A) 05/01/2021 2019   UROBILINOGEN 1.0 07/19/2011 1335   NITRITE NEGATIVE 05/01/2021 2019   LEUKOCYTESUR NEGATIVE 05/01/2021 2019   Sepsis Labs: '@LABRCNTIP' (procalcitonin:4,lacticidven:4)  ) Recent Results (from the past 240 hour(s))  Blood culture (routine x 2)     Status: None   Collection Time: 05/01/21  6:00 PM   Specimen: BLOOD RIGHT ARM  Result Value Ref Range Status   Specimen Description   Final    BLOOD RIGHT ARM Performed at [facility], [place] [address], [place], [place] [address]     Special Requests   Final    BOTTLES DRAWN AEROBIC AND ANAEROBIC Blood Culture adequate volume Performed at [place] [address] [place], [place] [address]    Culture   Final    NO GROWTH 5 DAYS Performed at [facility], [place] [address], [place], [place] [address]    Report Status 05/06/2021 FINAL  Final  Resp Panel by RT-PCR (Flu A&B, Covid) Nasopharyngeal Swab     Status: None   Collection Time: 05/01/21  6:19 PM   Specimen: Nasopharyngeal Swab; Nasopharyngeal(NP) swabs in vial transport medium  Result Value Ref Range Status   SARS Coronavirus 2 by  RT PCR NEGATIVE NEGATIVE Final    Comment: (NOTE) SARS-CoV-2 target nucleic acids are NOT DETECTED.  The SARS-CoV-2 RNA is generally detectable in upper respiratory specimens during the acute phase of infection. The lowest concentration of SARS-CoV-2 viral copies this assay can detect is 138 copies/mL. A negative result does not preclude SARS-Cov-2 infection and should not be used as the sole basis for treatment or other patient management decisions. A negative result may occur with  improper specimen collection/handling, submission of specimen other than nasopharyngeal swab, presence of viral mutation(s) within the areas targeted by this assay, and inadequate number of viral copies(<138 copies/mL). A negative result must be combined with clinical observations, patient history, and epidemiological information. The expected result is Negative.  Fact Sheet for Patients:  EntrepreneurPulse.com.au  Fact Sheet for Healthcare Providers:  IncredibleEmployment.be  This test is no t yet approved or cleared by the [place] FDA and  has been authorized for detection and/or diagnosis of SARS-CoV-2 by FDA under an Emergency Use Authorization (EUA). This EUA will remain  in effect (meaning this test can be used) for the duration of the COVID-19 declaration under Section  564(b)(1) of the Act, 21 U.S.C.section 360bbb-3(b)(1), unless the authorization is terminated  or revoked sooner.       Influenza A by PCR NEGATIVE NEGATIVE Final   Influenza B by PCR NEGATIVE NEGATIVE Final    Comment: (NOTE) The Xpert Xpress SARS-CoV-2/FLU/RSV plus assay is intended as an aid in the diagnosis of influenza from Nasopharyngeal swab specimens and should not be used as a sole basis for treatment. Nasal washings and aspirates are unacceptable for Xpert Xpress SARS-CoV-2/FLU/RSV testing.  Fact Sheet for Patients: EntrepreneurPulse.com.au  Fact Sheet for Healthcare Providers: IncredibleEmployment.be  This test is not yet approved or cleared by the [place] FDA and has been authorized for detection and/or diagnosis of SARS-CoV-2 by FDA under an Emergency Use Authorization (EUA). This EUA will remain in effect (meaning this test can be used) for the duration of the COVID-19 declaration under Section 564(b)(1) of the Act, 21 U.S.C. section 360bbb-3(b)(1), unless the authorization is terminated or revoked.  Performed at [facility], [place] [address], [place], [place] [address]   Blood culture (routine x 2)     Status: None   Collection Time: 05/01/21  6:30 PM   Specimen: BLOOD RIGHT ARM  Result Value Ref Range Status   Specimen Description   Final    BLOOD RIGHT ARM Performed at [place] [address] [place], [place] [address]    Special Requests   Final    BOTTLES DRAWN AEROBIC AND ANAEROBIC Blood Culture adequate volume Performed at [place] [address] [place], [place] [address]    Culture   Final    NO GROWTH 5 DAYS Performed at [facility], [place] [address], [place], [place] [address]    Report Status 05/06/2021 FINAL  Final      Studies: No results found.  Scheduled Meds:  amLODipine  10 mg Oral Daily   atorvastatin  20 mg Oral  Daily   brimonidine  1 drop Right Eye BID   carvedilol  25 mg Oral BID WC   docusate sodium  200 mg Oral BID   dorzolamide  1 drop Both Eyes BID   doxazosin  2 mg Oral QHS   enoxaparin (LOVENOX) injection  30 mg Subcutaneous Q24H   furosemide  80 mg Intravenous Q8H   hydrALAZINE  100 mg Oral Q8H   insulin aspart  0-15 Units Subcutaneous TID AC & HS   insulin glargine-yfgn  10 Units Subcutaneous Daily   isosorbide mononitrate  90 mg Oral Daily   lactose free nutrition  237 mL Oral TID WC   latanoprost  1 drop Left Eye QHS   multivitamin with minerals  1 tablet Oral Daily   pantoprazole  40 mg Oral BID AC   polyethylene glycol  17 g Oral Daily    Continuous Infusions:   LOS: 9 days     Kayleen Memos, MD [facility] Pager [phone]  If 7PM-7AM, please contact night-coverage [email or web address] Password [facility] 05/10/2021, 3:58 PM

## 2021-05-10 NOTE — Progress Notes (Addendum)
Progress Note  Patient Name: Robert Chaney Date of Encounter: 05/10/2021  [facility] Cardiologist: Skeet Latch, MD   Subjective   Feels his breathing is about at baseline, gets around with a walker at home.  Inpatient Medications    Scheduled Meds:  amLODipine  10 mg Oral Daily   atorvastatin  20 mg Oral Daily   brimonidine  1 drop Right Eye BID   carvedilol  25 mg Oral BID WC   docusate sodium  200 mg Oral BID   dorzolamide  1 drop Both Eyes BID   doxazosin  2 mg Oral QHS   enoxaparin (LOVENOX) injection  30 mg Subcutaneous Q24H   furosemide  80 mg Intravenous Q8H   hydrALAZINE  100 mg Oral Q8H   insulin aspart  0-15 Units Subcutaneous TID AC & HS   insulin glargine-yfgn  10 Units Subcutaneous Daily   isosorbide mononitrate  90 mg Oral Daily   lactose free nutrition  237 mL Oral TID WC   latanoprost  1 drop Left Eye QHS   multivitamin with minerals  1 tablet Oral Daily   pantoprazole  40 mg Oral BID AC   polyethylene glycol  17 g Oral Daily   Continuous Infusions:   PRN Meds: acetaminophen **OR** acetaminophen, albuterol, butamben-tetracaine-benzocaine, lidocaine, nitroGLYCERIN, ondansetron **OR** ondansetron (ZOFRAN) IV, oxyCODONE   Vital Signs    Vitals:   05/09/21 2059 05/10/21 0453 05/10/21 0534 05/10/21 0536  BP: 128/64 (!) 144/64  (!) 145/61  Pulse: (!) 58 62    Resp: 18 20    Temp: 98.2 F (36.8 C) 98.3 F (36.8 C)    TempSrc: Oral Oral    SpO2: 100% 100%    Weight:   78 kg   Height:        Intake/Output Summary (Last 24 hours) at 05/10/2021 0928 Last data filed at 05/10/2021 0341 Gross per 24 hour  Intake 840 ml  Output 970 ml  Net -130 ml   Last 3 Weights 05/10/2021 05/09/2021 05/08/2021  Weight (lbs) 171 lb 14.4 oz 172 lb 9.9 oz 171 lb 4.8 oz  Weight (kg) 77.973 kg 78.3 kg 77.7 kg      Telemetry    SR, Sinus brady - Personally Reviewed  ECG    No new ECG tracing today. - Personally Reviewed  Physical Exam   GEN: No  acute distress.   Neck: JVD 10 cm Cardiac: RRR, II-III murmur, no rubs, or gallops.  Respiratory: diminished to auscultation bilaterally with rales in the bases. GI: Soft, nontender, non-distended  MS: 1-2+ LE edema; No deformity. Neuro:  Nonfocal  Psych: Normal affect   Labs    High Sensitivity Troponin:   Recent Labs  Lab 05/01/21 1713 05/01/21 2019 05/02/21 0510  TROPONINIHS 113* 114* 94*     Chemistry Recent Labs  Lab 05/07/21 0434 05/08/21 0423 05/09/21 0903 05/10/21 0717  NA 137 135 135 135  K 4.9 4.6 4.3 4.9  CL 104 101 100 98  CO2 27 27 28 30   GLUCOSE 92 93 227* 125*  BUN 76* 75* 84* 84*  CREATININE 3.78* 3.62* 3.59* 4.09*  CALCIUM 8.1* 8.3* 8.4* 8.4*  MG 2.5* 2.4  --  2.4  ALBUMIN  --   --   --  2.8*  GFRNONAA 16* 17* 17* 15*  ANIONGAP 6 7 7 7     Lipids No results for input(s): CHOL, TRIG, HDL, LABVLDL, LDLCALC, CHOLHDL in the last 168 hours.  Hematology Recent Labs  Lab 05/06/21 0525 05/07/21 0434 05/10/21 0717  WBC 3.3* 3.6* 3.9*  RBC 2.60* 2.53* 2.60*  HGB 7.9* 7.6* 7.8*  HCT 25.5* 24.8* 25.3*  MCV 98.1 98.0 97.3  MCH 30.4 30.0 30.0  MCHC 31.0 30.6 30.8  RDW 18.2* 17.9* 17.2*  PLT 164 167 200   Thyroid No results for input(s): TSH, FREET4 in the last 168 hours.  BNP BNP    Component Value Date/Time   BNP 3,367.1 (H) 05/01/2021 2019   Radiology    No results found.  Cardiac Studies   Limited Echo 05/02/2021: Impressions:  1. Left ventricular ejection fraction, by estimation, is 60 to 65%. The  left ventricle has normal function. The left ventricle has no regional  wall motion abnormalities. There is severe left ventricular hypertrophy. Left ventricular diastolic parameters  are consistent with Grade II diastolic dysfunction (pseudonormalization). The average left ventricular global longitudinal strain is -11.1 %. The global longitudinal strain is abnormal, there is relative preservation of  strain at the apex (bullseye appearance). No  LV outflow tract gradient.   2. Right ventricular systolic function is mildly reduced. The right  ventricular size is normal. Mildly increased right ventricular wall  thickness. Tricuspid regurgitation signal is inadequate for assessing PA pressure.   3. The mitral valve is normal in structure, no systolic anterior motion.  Trivial mitral valve regurgitation. No evidence of mitral stenosis.   4. The aortic valve is tricuspid. Aortic valve regurgitation is trivial.  No aortic stenosis is present.   5. The inferior vena cava is dilated in size with >50% respiratory  variability, suggesting right atrial pressure of 8 mmHg.   6. The patient carries a diagnosis of hypertrophic cardiomyopathy.  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   Patient Profile     70 y.o. 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 Cyd Silence.   Assessment & Plan    Acute on Chronic Hypoxic Respiratory Failure  Acute on Chronic Diastolic CHF - chronic home O2 4 lpm - got IVF on admit, I/O net + 2.2 L at one point, now -855 ml - UOP has slowed and Cr higher,  RN will make sure all output is charted - w/ HCM, do not want to over diurese - wt 175 on admit >> 168 >> 171 - since he does not want HD, Nephrology signed off. - on Lasix 80 mg  IV tid per their recs.  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 Hypertrophic Cardiomyopathy - echo this admit w/ concern for amyloid -  since pt planning on d/c w/ Hospice, no further workup  Chest Pain Elevated Troponin - none overnight - med rx for possible CAD w/ poor renal function - Trop elevation more c/w demand ischemia - nl EF on echo - continue home rx - no further eval  Hypertensive Emergency - BP has chronically been difficult to control - BP relatively controlled on current regimen, no changes  CKD Stage IV - Cr prev 2.5-3.0 -  has increased w/ diuresis to 4.09 - no HD, Nephrology signed off - follow   Otherwise, per primary team: - Rectal cancer - GERD - Glaucoma - Chronic hypoxic respiratory failure on 4L at home  For questions or updates, please contact [place] Please consult [email or web address] for contact info under        Signed, Rosaria Ferries, PA-C  05/10/2021, 9:28 AM    Patient seen and examined and agree with Sande Rives as detailed above.   In brief, the patient is a 70 y.o. 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    Unfortunately, the patient has not been responding well to diuresis and had rising Cr despite being volume overloaded. Given limited options and overall poor prognosis, the patient has decided not to proceed with escalation of care and declined HD. Goal is to focus mainly on comfort at this time with plans for hospice support.   GEN: Sitting in the chair, alert Neck: Supple Cardiac: RR, 2/6 systolic murmur, no rubs Respiratory: Crackles at the bases bilaterally GI: Soft, nontender, non-distended  MS: 1-2+ edema bilaterally;  warm Neuro:  Nonfocal  Psych: Depressed   Plan: -Per discussion with palliative care, no escalation of care and patient declined HD; plan for hospice support at home -Continue lasix 80mg  IV TID; would discharge on lasix 100mg  PO BID  -Continue current antihypertensive regimen: amlodipine 10mg  daily, coreg 25mg  BID, imdur 90mg , hydralazine 100mg  TID, and doxazosin 2mg  daily -Can adjust cardiac meds as needed while on hospice  We will sign-off.   Gwyndolyn Kaufman, MD

## 2021-05-10 NOTE — Progress Notes (Signed)
Nutrition Follow-up  DOCUMENTATION CODES:   Not applicable  INTERVENTION:  - continue Boost Plus TID.   NUTRITION DIAGNOSIS:   Increased nutrient needs related to cancer and cancer related treatments as evidenced by estimated needs. -ongoing  GOAL:   Patient will meet greater than or equal to 90% of their needs -met  MONITOR:   PO intake, Supplement acceptance, Labs, Weight trends, I & O's   ASSESSMENT:   70 yo male with a PMH of rectal adenocarcinoma (dx CLN 03/2021), chronic hypoxic respiratory failure (4L at home), T2DM with insulin use, HLD, HTN, CKD stage 4, GERD, dCHF, HCM w/o LVOT obstruction, OSA on CPAP, and glaucoma who presented to the hospital with worsening shortness of breath and nausea. Admitted with hypertensive crisis.  He has been eating mainly 100% at meals since 11/14. He has been accepting Boost Plus ~90% of the time offered.   Patient sitting up in chair with no visitors present at the time of RD visit. Patient states that his appetite is good and that he has no difficulties or discomfort with eating.   He was sipping on Boost prior to and following RD visit. He denied any nutrition-related questions or concerns.   Weight today is +3 lb compared to weight on 11/9. He is noted to be -1.075 L since admission.    Labs reviewed; CBGs: 127 and 182 mg/dl, BUN: 84 mg/dl, creatinine: 4.09 mg/dl, Ca: 8.4 mg/dl, GFR: 15 ml/min.   Medications reviewed; 200 mg colace BID, 80 mg IV lasix TID, sliding scale novolog, 10 units semglee/day, 1 tablet multivitamin with minerals/day, 40 mg oral protonix BID, 17 g miralax/day.     NUTRITION - FOCUSED PHYSICAL EXAM:  Flowsheet Row Most Recent Value  Orbital Region No depletion  Upper Arm Region Mild depletion  Thoracic and Lumbar Region Unable to assess  Buccal Region No depletion  Temple Region No depletion  Clavicle Bone Region Mild depletion  Clavicle and Acromion Bone Region Mild depletion  Scapular Bone Region  Unable to assess  Dorsal Hand Mild depletion  Patellar Region No depletion  Anterior Thigh Region No depletion  Posterior Calf Region No depletion  Edema (RD Assessment) Severe  [BLE]  Hair Reviewed  Eyes Reviewed  Mouth Reviewed  Skin Reviewed  Nails Reviewed       Diet Order:   Diet Order             Diet heart healthy/carb modified Room service appropriate? Yes; Fluid consistency: Thin; Fluid restriction: 1500 mL Fluid  Diet effective now                   EDUCATION NEEDS:   No education needs have been identified at this time  Skin:  Skin Assessment: Reviewed RN Assessment  Last BM:  11/17 (type 5 x1)  Height:   Ht Readings from Last 1 Encounters:  05/01/21 '5\' 8"'  (1.727 m)    Weight:   Wt Readings from Last 1 Encounters:  05/10/21 78 kg     Estimated Nutritional Needs:  Kcal:  2100-2300 Protein:  105-120 grams Fluid:  >2 L     Jarome Matin, MS, RD, LDN, CNSC Inpatient Clinical Dietitian RD pager # available in AMION  After hours/weekend pager # available in [facility]

## 2021-05-11 LAB — GLUCOSE, CAPILLARY
Glucose-Capillary: 117 mg/dL — ABNORMAL HIGH (ref 70–99)
Glucose-Capillary: 157 mg/dL — ABNORMAL HIGH (ref 70–99)
Glucose-Capillary: 112 mg/dL — ABNORMAL HIGH (ref 70–99)
Glucose-Capillary: 164 mg/dL — ABNORMAL HIGH (ref 70–99)

## 2021-05-11 MED ORDER — ORAL CARE MOUTH RINSE
15.0000 mL | Freq: Two times a day (BID) | OROMUCOSAL | Status: DC
  Administered 2021-05-11 – 2021-05-16 (×8): 15 mL via OROMUCOSAL

## 2021-05-11 NOTE — Progress Notes (Signed)
   05/11/21 1400  Mobility  Activity Ambulated in hall  Level of Assistance Contact guard assist, steadying assist  Assistive Device Front wheel walker  Distance Ambulated (ft) 450 ft  Mobility Ambulated with assistance in hallway  Mobility Response Tolerated well  Mobility performed by Mobility specialist  $Mobility charge 1 Mobility   Pt initially reluctant to mobilize this afternoon. He stated that he is frustrated and believes that none of the workers actually care about him. Pt kept repeating "I just want to know what's going on, and why do I have to do all this". Seemed frustrated that he had to walk everyday. Ensured the pt that mobilizing is good for his health and will help him get better quicker. Also told the pt that walking every day ensures he will not get weak and have to stay longer, but if he was not feeling well he did not need to mobilize for that day.   Pt agreed to mobilize after the discussion. Use bedside urinal prior to session. Stated he was feeling "12/10" pain everywhere but still wanted to continue with session. Ambulated about 421ft in hall with RW, tolerated well. No complaints throughout session. Left pt in bed with call bell at side. RN notified of session.    [place] Specialist Acute Rehab Services Office: [phone]

## 2021-05-11 NOTE — Progress Notes (Signed)
Patient agrees to call when he is ready for nocturnal CPAP

## 2021-05-11 NOTE — Progress Notes (Signed)
PROGRESS NOTE  Robert Chaney DGL:[id number] DOB: 1951/03/23 DOA: 05/01/2021 PCP: [name], MD  HPI/Recap of past 24 hours:  Robert Chaney is a 70  yo male with PMH rectal adenocarcinoma (dx CLN 03/2021), chronic hypoxic respiratory failure (4L at home), IDDM, HLD, HTN, CKD4, GERD, dCHF, HCM w/o LVOT obstruction, OSA on CPAP, glaucoma who presented to [facility] ED from the cancer center due to worsening shortness of breath and nausea.  On work-up he was found to be significantly hypertensive, 232/141 with mildly elevated troponin, 113 and elevated BNP. CXR showed pulmonary edema and started on Lasix as well as IV labetalol and his home antihypertensives were resumed. He was admitted for diuresis and further blood pressure control. Renal function did not tolerate diuresis.  He underwent evaluation with palliative care due to his decline and lack of improvement.  Patient elected for foregoing hemodialysis and instead focusing on pursuing more of a hospice/palliative route.   05/11/2021: Patient was seen at his bedside.  He is minimally interactive.  He denies having any complaints.    Assessment/Plan: Principal Problem:   Acute on chronic diastolic CHF (congestive heart failure) (HCC) Active Problems:   Acute renal failure superimposed on stage 4 chronic kidney disease (HCC)   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HCC)   OSA on CPAP   Type 2 diabetes mellitus with stage 4 chronic kidney disease, with long-term current use of insulin (HCC)   Chronic respiratory failure with hypoxia (HCC)   Rectal adenocarcinoma (HCC)   Mixed hyperlipidemia   Glaucoma   GERD without esophagitis  Acute on chronic diastolic CHF (congestive heart failure) ([place]) - patient presenting with symptoms of worsening shortness of breath and orthopnea - also noted to have pulmonary edema, B/L LE edema, DOE, pleural effusions Seen by cardiology - diuresis (lasix held starting 11/11 due to increase in creatinine)  lasix resumed on 11/14 (renal function labile) -limited echo repeated on admission: EF 60-65%, Gr II DD - unfortunate circumstance with limited ability to diurese in setting of brittle CKD4; patient met with palliative care on 11/14 and decided to avoid HD and instead was interested on focusing on comfort driven care (okay continuing lasix and other meds if helping but did not wish to escalate care); seen by palliative care team.  Worsening nonoliguric acute renal failure superimposed on stage 4 chronic kidney disease ([place]) - patient has history of CKD4. Baseline creat ~ 2.7 - 2.8, eGFR 21-24 - some worsening of renal function on 11/11; most likely was overdiuresis but also at risk for ATN -Appreciate nephrology evaluation.  SPEP/UPEP ordered as well - see palliative care discussion; patient declining HD -Per nephrology okay for lasix 80 mg IV 2-3 times a day if provides comfort Creatinine uptrending, 4.09 with GFR 15, continue to follow. Repeat renal panel in the morning   GERD without esophagitis -Continue PPI   Glaucoma -Continue home eyedrops   Mixed hyperlipidemia -Continue statin      Rectal adenocarcinoma (HCC) - Recent diagnosis of rectal adenocarcinoma in 03/2021 - Patient has recently been established with Dr. [name] - Receiving radiation therapy in the outpatient setting -Cetacaine spray working well, patient requesting this at discharge.  Also continue as needed oxycodone - does not appear to be a candidate for chemo or surgery due to cardiopulm status; decision of patient to pursue HD would not change cancer management per oncology    Chronic respiratory failure with hypoxia ([place]) - on chronic 4L [place] O2 at home   Type 2  diabetes mellitus with stage 4 chronic kidney disease, with long-term current use of insulin (HCC) - Hemoglobin A1C performed in October was 5.9% - continue diabetic diet   OSA on CPAP - CPAP QHS per home regimen   HOCM (hypertrophic obstructive  cardiomyopathy) ([place]) - patient has carried this diagnosis previously; repeat echo this admission showing a "global longitudinal strain". This appears to raise some consideration that underlying amyloid may be playing a factor - cardiology following; patient will have further testing likely outpt with possible PYP scan; follow up SPEP/UPEP as well   Hyperphosphatemia secondary to worsening acute renal insufficiency Serum phosphorus 5.1   Resolved hypertensive crisis-resolved as of 05/04/2021 - Patient presenting with marked markedly elevated blood pressures as high as 232/141 - Presence of acute cardiogenic pulmonary edema and elevated troponin with chest pain are concerning for acute hypertensive emergency - s/p NTG and labetalol; restarted on home meds also - BP responded but still above goal (imdur increased to 90 mg daily on 11/10 per cardiology) -Continue amlodipine, Coreg, doxazosin, hydralazine, Imdur   Elevated troponin level not due myocardial infarction-resolved as of 05/04/2021 - Patient presenting with elevated troponin of 113. Likely enzyme leak in setting of hypertensive crisis - denies CP but did have DOE and chest pressure with deep inspiration - no large uptrend with trending - no plans for ischemic evaluation especially given patient's decision to avoid further escalation    Anemia of chronic disease Hemoglobin 7.8 No overt bleeding Continue to monitor     Old records reviewed in assessment of this patient   Antimicrobials: None.     DVT prophylaxis: Lovenox subcu daily.   Code Status:   Code Status: DNR   Disposition Plan:  Status is: Inpatient   Patient will require at least 2 midnights for further evaluation and treatment of present condition.     Objective: Vitals:   05/11/21 0002 05/11/21 0526 05/11/21 0531 05/11/21 1151  BP: (!) 134/58  (!) 155/60 (!) 148/53  Pulse: 62  63 69  Resp: '20  20 20  ' Temp: 98.3 F (36.8 C)  98.9 F (37.2 C) 98 F  (36.7 C)  TempSrc: Oral  Oral Oral  SpO2: 100%  100% 100%  Weight:  77.7 kg    Height:        Intake/Output Summary (Last 24 hours) at 05/11/2021 1649 Last data filed at 05/11/2021 1500 Gross per 24 hour  Intake 360 ml  Output 1100 ml  Net -740 ml   Filed Weights   05/09/21 0352 05/10/21 0534 05/11/21 0526  Weight: 78.3 kg 78 kg 77.7 kg    Exam:  General: 70 y.o. year-old male well-developed well-nourished in no acute distress.  He is alert and wanted x3.  Hard of hearing.   Cardiovascular: Regular rate and rhythm no rubs or gallops.   Respiratory: Clear to auscultation no wheezes or rales. Abdomen: Soft nontender normal bowel sounds present. Musculoskeletal: 2+ pitting edema in lower extremities bilaterally. Skin: No ulcerative lesions noted.   Psychiatry: Mood is appropriate for condition and setting.  Data Reviewed: CBC: Recent Labs  Lab 05/05/21 0522 05/06/21 0525 05/07/21 0434 05/10/21 0717  WBC 3.3* 3.3* 3.6* 3.9*  NEUTROABS 2.2 2.3 2.4  --   HGB 9.0* 7.9* 7.6* 7.8*  HCT 29.7* 25.5* 24.8* 25.3*  MCV 98.0 98.1 98.0 97.3  PLT 175 164 167 938   Basic Metabolic Panel: Recent Labs  Lab 05/05/21 0522 05/06/21 0525 05/07/21 0434 05/08/21 0423 05/09/21 0903 05/10/21 1829  NA 135 136 137 135 135 135  K 4.1 4.4 4.9 4.6 4.3 4.9  CL 102 105 104 101 100 98  CO2 '26 27 27 27 28 30  ' GLUCOSE 107* 80 92 93 227* 125*  BUN 62* 65* 76* 75* 84* 84*  CREATININE 3.52* 3.36* 3.78* 3.62* 3.59* 4.09*  CALCIUM 8.4* 8.0* 8.1* 8.3* 8.4* 8.4*  MG 2.2 2.2 2.5* 2.4  --  2.4  PHOS  --   --   --   --   --  5.1*   GFR: Estimated Creatinine Clearance: 15.8 mL/min (A) (by C-G formula based on SCr of 4.09 mg/dL (H)). Liver Function Tests: Recent Labs  Lab 05/10/21 0717  ALBUMIN 2.8*   No results for input(s): LIPASE, AMYLASE in the last 168 hours. No results for input(s): AMMONIA in the last 168 hours. Coagulation Profile: No results for input(s): INR, PROTIME in the last  168 hours. Cardiac Enzymes: No results for input(s): CKTOTAL, CKMB, CKMBINDEX, TROPONINI in the last 168 hours. BNP (last 3 results) No results for input(s): PROBNP in the last 8760 hours. HbA1C: No results for input(s): HGBA1C in the last 72 hours. CBG: Recent Labs  Lab 05/10/21 1623 05/10/21 2038 05/11/21 0744 05/11/21 1149 05/11/21 1638  GLUCAP 165* 181* 117* 157* 112*   Lipid Profile: No results for input(s): CHOL, HDL, LDLCALC, TRIG, CHOLHDL, LDLDIRECT in the last 72 hours. Thyroid Function Tests: No results for input(s): TSH, T4TOTAL, FREET4, T3FREE, THYROIDAB in the last 72 hours. Anemia Panel: No results for input(s): VITAMINB12, FOLATE, FERRITIN, TIBC, IRON, RETICCTPCT in the last 72 hours. Urine analysis:    Component Value Date/Time   COLORURINE STRAW (A) 05/01/2021 2019   APPEARANCEUR CLEAR 05/01/2021 2019   LABSPEC 1.011 05/01/2021 2019   PHURINE 7.0 05/01/2021 2019   GLUCOSEU NEGATIVE 05/01/2021 2019   HGBUR SMALL (A) 05/01/2021 2019   BILIRUBINUR NEGATIVE 05/01/2021 2019   KETONESUR NEGATIVE 05/01/2021 2019   PROTEINUR >=300 (A) 05/01/2021 2019   UROBILINOGEN 1.0 07/19/2011 1335   NITRITE NEGATIVE 05/01/2021 2019   LEUKOCYTESUR NEGATIVE 05/01/2021 2019   Sepsis Labs: '@LABRCNTIP' (procalcitonin:4,lacticidven:4)  ) Recent Results (from the past 240 hour(s))  Blood culture (routine x 2)     Status: None   Collection Time: 05/01/21  6:00 PM   Specimen: BLOOD RIGHT ARM  Result Value Ref Range Status   Specimen Description   Final    BLOOD RIGHT ARM Performed at [facility], [place] [address], [place], [place] [address]    Special Requests   Final    BOTTLES DRAWN AEROBIC AND ANAEROBIC Blood Culture adequate volume Performed at [place] [address] [place], [place] [address]    Culture   Final    NO GROWTH 5 DAYS Performed at [facility], [place] [address], [place], [place] [address]    Report Status  05/06/2021 FINAL  Final  Resp Panel by RT-PCR (Flu A&B, Covid) Nasopharyngeal Swab     Status: None   Collection Time: 05/01/21  6:19 PM   Specimen: Nasopharyngeal Swab; Nasopharyngeal(NP) swabs in vial transport medium  Result Value Ref Range Status   SARS Coronavirus 2 by RT PCR NEGATIVE NEGATIVE Final    Comment: (NOTE) SARS-CoV-2 target nucleic acids are NOT DETECTED.  The SARS-CoV-2 RNA is generally detectable in upper respiratory specimens during the acute phase of infection. The lowest concentration of SARS-CoV-2 viral copies this assay can detect is 138 copies/mL. A negative result does not preclude  SARS-Cov-2 infection and should not be used as the sole basis for treatment or other patient management decisions. A negative result may occur with  improper specimen collection/handling, submission of specimen other than nasopharyngeal swab, presence of viral mutation(s) within the areas targeted by this assay, and inadequate number of viral copies(<138 copies/mL). A negative result must be combined with clinical observations, patient history, and epidemiological information. The expected result is Negative.  Fact Sheet for Patients:  EntrepreneurPulse.com.au  Fact Sheet for Healthcare Providers:  IncredibleEmployment.be  This test is no t yet approved or cleared by the [place] FDA and  has been authorized for detection and/or diagnosis of SARS-CoV-2 by FDA under an Emergency Use Authorization (EUA). This EUA will remain  in effect (meaning this test can be used) for the duration of the COVID-19 declaration under Section 564(b)(1) of the Act, 21 U.S.C.section 360bbb-3(b)(1), unless the authorization is terminated  or revoked sooner.       Influenza A by PCR NEGATIVE NEGATIVE Final   Influenza B by PCR NEGATIVE NEGATIVE Final    Comment: (NOTE) The Xpert Xpress SARS-CoV-2/FLU/RSV plus assay is intended as an aid in the diagnosis of  influenza from Nasopharyngeal swab specimens and should not be used as a sole basis for treatment. Nasal washings and aspirates are unacceptable for Xpert Xpress SARS-CoV-2/FLU/RSV testing.  Fact Sheet for Patients: EntrepreneurPulse.com.au  Fact Sheet for Healthcare Providers: IncredibleEmployment.be  This test is not yet approved or cleared by the [place] FDA and has been authorized for detection and/or diagnosis of SARS-CoV-2 by FDA under an Emergency Use Authorization (EUA). This EUA will remain in effect (meaning this test can be used) for the duration of the COVID-19 declaration under Section 564(b)(1) of the Act, 21 U.S.C. section 360bbb-3(b)(1), unless the authorization is terminated or revoked.  Performed at [facility], [place] [address] [place], [place] [address]   Blood culture (routine x 2)     Status: None   Collection Time: 05/01/21  6:30 PM   Specimen: BLOOD RIGHT ARM  Result Value Ref Range Status   Specimen Description   Final    BLOOD RIGHT ARM Performed at [facility] [address] [place], [place] [address]    Special Requests   Final    BOTTLES DRAWN AEROBIC AND ANAEROBIC Blood Culture adequate volume Performed at [place] [address] [place], [place] [address]    Culture   Final    NO GROWTH 5 DAYS Performed at [facility], [place] [address] [place],  [address]    Report Status 05/06/2021 FINAL  Final      Studies: No results found.  Scheduled Meds:  amLODipine  10 mg Oral Daily   atorvastatin  20 mg Oral Daily   brimonidine  1 drop Right Eye BID   carvedilol  25 mg Oral BID WC   docusate sodium  200 mg Oral BID   dorzolamide  1 drop Both Eyes BID   doxazosin  2 mg Oral QHS   enoxaparin (LOVENOX) injection  30 mg Subcutaneous Q24H   furosemide  80 mg Intravenous Q8H   hydrALAZINE  100 mg Oral Q8H   insulin aspart  0-15  Units Subcutaneous TID AC & HS   insulin glargine-yfgn  10 Units Subcutaneous Daily   isosorbide mononitrate  90 mg Oral Daily   lactose free nutrition  237 mL Oral TID WC   latanoprost  1 drop Left Eye QHS  mouth rinse  15 mL Mouth Rinse q12n4p   multivitamin with minerals  1 tablet Oral Daily   pantoprazole  40 mg Oral BID AC   polyethylene glycol  17 g Oral Daily    Continuous Infusions:   LOS: 10 days     Kayleen Memos, MD [facility] Pager [phone]  If 7PM-7AM, please contact night-coverage [email or web address] Password [facility] 05/11/2021, 4:49 PM

## 2021-05-12 LAB — RENAL FUNCTION PANEL
Albumin: 2.7 g/dL — ABNORMAL LOW (ref 3.5–5.0)
Anion gap: 7 (ref 5–15)
BUN: 97 mg/dL — ABNORMAL HIGH (ref 8–23)
CO2: 31 mmol/L (ref 22–32)
Calcium: 8.5 mg/dL — ABNORMAL LOW (ref 8.9–10.3)
Chloride: 98 mmol/L (ref 98–111)
Creatinine, Ser: 3.95 mg/dL — ABNORMAL HIGH (ref 0.61–1.24)
GFR, Estimated: 15 mL/min — ABNORMAL LOW (ref >60)
Glucose, Bld: 111 mg/dL — ABNORMAL HIGH (ref 70–99)
Phosphorus: 4.5 mg/dL (ref 2.5–4.6)
Potassium: 4.4 mmol/L (ref 3.5–5.1)
Sodium: 136 mmol/L (ref 135–145)

## 2021-05-12 LAB — CBC
HCT: 25.1 % — ABNORMAL LOW (ref 39.0–52.0)
Hemoglobin: 7.5 g/dL — ABNORMAL LOW (ref 13.0–17.0)
MCH: 29.4 pg (ref 26.0–34.0)
MCHC: 29.9 g/dL — ABNORMAL LOW (ref 30.0–36.0)
MCV: 98.4 fL (ref 80.0–100.0)
Platelets: 205 10*3/uL (ref 150–400)
RBC: 2.55 MIL/uL — ABNORMAL LOW (ref 4.22–5.81)
RDW: 17.2 % — ABNORMAL HIGH (ref 11.5–15.5)
WBC: 4.2 10*3/uL (ref 4.0–10.5)
nRBC: 0 % (ref 0.0–0.2)

## 2021-05-12 LAB — URIC ACID: Uric Acid, Serum: 10.1 mg/dL — ABNORMAL HIGH (ref 3.7–8.6)

## 2021-05-12 LAB — GLUCOSE, CAPILLARY
Glucose-Capillary: 134 mg/dL — ABNORMAL HIGH (ref 70–99)
Glucose-Capillary: 126 mg/dL — ABNORMAL HIGH (ref 70–99)
Glucose-Capillary: 124 mg/dL — ABNORMAL HIGH (ref 70–99)
Glucose-Capillary: 187 mg/dL — ABNORMAL HIGH (ref 70–99)

## 2021-05-12 MED ORDER — BISACODYL 10 MG RE SUPP
10.0000 mg | Freq: Once | RECTAL | Status: AC
  Administered 2021-05-12: 10 mg via RECTAL
  Filled 2021-05-12: qty 1

## 2021-05-12 MED ORDER — SENNOSIDES-DOCUSATE SODIUM 8.6-50 MG PO TABS
2.0000 | ORAL_TABLET | Freq: Two times a day (BID) | ORAL | Status: DC
  Administered 2021-05-12 – 2021-05-16 (×9): 2 via ORAL
  Filled 2021-05-12 (×9): qty 2

## 2021-05-12 MED ORDER — HYDROMORPHONE HCL 1 MG/ML IJ SOLN
0.5000 mg | INTRAMUSCULAR | Status: DC | PRN
  Administered 2021-05-12 – 2021-05-14 (×4): 0.5 mg via INTRAVENOUS
  Filled 2021-05-12 (×4): qty 0.5

## 2021-05-12 MED ORDER — PREDNISONE 20 MG PO TABS
40.0000 mg | ORAL_TABLET | Freq: Every day | ORAL | Status: AC
  Administered 2021-05-12 – 2021-05-16 (×5): 40 mg via ORAL
  Filled 2021-05-12 (×5): qty 2

## 2021-05-12 NOTE — Progress Notes (Incomplete)
The patient was educated on the importance of keeping his oxygen on, and at this time he agrees to keep it on. MD notified of event @1414 .

## 2021-05-12 NOTE — Progress Notes (Signed)
Pt states he will call when ready for CPAP QHS.

## 2021-05-12 NOTE — Progress Notes (Signed)
RN contacted by CMT due to decreased oxygen saturation 58%, Upon entry O2 sat assessed with dinamap, sat on room air 58%. pt is complaining of worsening SOB. HF [place] applied at 6 lt. Saturation improved 90 %. Will update primary RN. Pt encouraged to wear O2.

## 2021-05-12 NOTE — TOC Progression Note (Signed)
Transition of Care [facility]) - Progression Note    Patient Details  Name: Robert Chaney MRN: [id number] Date of Birth: 28-Mar-1951  Transition of [facility]) CM/SW Contact  Ross Ludwig, [place] Phone Number: 05/12/2021, 5:38 PM  Clinical Narrative:     CSW was informed that patient wanted to speak to a Education officer, museum.  CSW spoke to patient, he was asking if there is a way that his brother can get to the hospital.  Per patient, his brother is arriving via train.  CSW informed patient, that once his brother arrives, his brother can use a cab to get to hospital.  CSW informed patient there is not much else that this CSW Can do for patient's brother to get to the hospital.  CSW suggested that his brother try using [employer] or Lyft, [place] also informed patient that his brother can ask at train station how to get to hospital.  Expected Discharge Plan: Home/Self Care Barriers to Discharge: Continued Medical Work up  Expected Discharge Plan and Services Expected Discharge Plan: Home/Self Care   Discharge Planning Services: CM Consult   Living arrangements for the past 2 months: Single Family Home                                       Social Determinants of Health (SDOH) Interventions    Readmission Risk Interventions Readmission Risk Prevention Plan 05/04/2021 04/11/2021 03/22/2021  Transportation Screening Complete Complete Complete  PCP or Specialist Appt within 3-5 Days - - -  HRI or [place] - - -  Social Work Consult for [place] - - -  Medication Review Press photographer) Complete Complete Complete  PCP or Specialist appointment within 3-5 days of discharge Complete Complete Complete  HRI or Home Care Consult Complete Complete Complete  SW Recovery Care/Counseling Consult Complete Complete Complete  Palliative Care Screening Not Applicable Not Applicable Not Applicable  Skilled Nursing Facility Not Applicable Not  Applicable Not Applicable  Some recent data might be hidden

## 2021-05-12 NOTE — Progress Notes (Signed)
    Chart Reviewed and patient assessed.   Patient continues to complaint of generalized pain. He is tearful expressing his awareness that he is not getting better and knows his body is declining and getting tired. He had a rapid response called yesterday evening due to recurrent chest pain. He states this is better with some intermittent pains. He also had an hypoxic episode requiring oxygen increase. Education provided on use of Dilaudid for pain and dyspnea. He verbalized understanding and appreciation.   We reviewed Kerry's goals of care. He is hopeful he can return home with the support of his brother and spend what time he has with him with ongoing hospice support. He wishes to treat the treatable while hospitalized with no escalation of care. His brother is supposed to arrive in town today.   Mr. Corporan understands if he further declines he may be appropriate for residential hospice. He expresses understanding and if he further declines or has increased symptom burden he and his brother are open to consider [employer].   All questions answered and support provided. Patient aware I will follow up on Monday. I am off service tomorrow (Sunday).    Time Total: 35 min.   Visit consisted of counseling and education dealing with the complex and emotionally intense issues of symptom management and palliative care in the setting of serious and potentially life-threatening illness.Greater than 50%  of this time was spent counseling and coordinating care related to the above assessment and plan.  Alda Lea, AGPCNP-BC  Palliative Medicine Team [phone]

## 2021-05-12 NOTE — Progress Notes (Signed)
Rapid Response Event Note   Reason for Call : Pt c/o Chest Pain 11/10   Initial Focused Assessment: Pt A/O, and following commands.  Pt has some crackles Bil, lungs (lower lobes).  2 L [place] placed on for comfort.  Pt states pain had left but back at a  5/6.  Pt Primary, RN has contacted [facility], NP for orders. Pt CP is not new.   Interventions: Pt repositioned and says his pain is gone.  Pt did received his night medications.  Pt also received nitro SL x 2 and EkG completed per protocol.  See flow sheet for VS.   Plan of Care: Pt will remain in current location.  See orders for plan. Please follow up and call if the need arises. Thanks.    Event Summary:   MD Notified: yes Call Time: [place] Time: 0000 End Time: 0025  Dyann Ruddle, RN

## 2021-05-12 NOTE — Progress Notes (Signed)
PROGRESS NOTE  Robert Chaney ZWC:[id number] DOB: 1951-01-23 DOA: 05/01/2021 PCP: [name], MD  HPI/Recap of past 24 hours:  Robert Chaney is a 70  yo male with PMH rectal adenocarcinoma (dx CLN 03/2021), chronic hypoxic respiratory failure (4L at home), IDDM2, HLD, HTN, CKD4, GERD, dCHF, HCM w/o LVOT obstruction, OSA on CPAP, glaucoma who presented to [facility] ED from the cancer center due to worsening shortness of breath and nausea.  On work-up he was found to be significantly hypertensive, 232/141 with mildly elevated troponin, 113 and elevated BNP. CXR showed pulmonary edema.  He was started on Lasix as well as IV labetalol and his home antihypertensives were resumed. Admitted for diuresis and further blood pressure control. Renal function did not tolerate diuresis.  He underwent evaluation with palliative care due to his decline and lack of improvement.  Patient elected for foregoing hemodialysis and instead focusing on pursuing more of a hospice/palliative route.   05/12/2021: Reports having some discomfort in his left chest which was reproducible with palpation.  Twelve-lead EKG obtained did not show any acute changes from prior twelve-lead EKG.  Nonspecific ST-T changes.  Also reports pain in his ankles bilaterally.  Suspect gout with heavy doses of IV Lasix.  Prednisone started.    Assessment/Plan: Principal Problem:   Acute on chronic diastolic CHF (congestive heart failure) (HCC) Active Problems:   Acute renal failure superimposed on stage 4 chronic kidney disease (HCC)   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HCC)   OSA on CPAP   Type 2 diabetes mellitus with stage 4 chronic kidney disease, with long-term current use of insulin (HCC)   Chronic respiratory failure with hypoxia (HCC)   Rectal adenocarcinoma (HCC)   Mixed hyperlipidemia   Glaucoma   GERD without esophagitis  Acute on chronic diastolic CHF (congestive heart failure) ([place]) - patient presenting with symptoms of  worsening shortness of breath and orthopnea - also noted to have pulmonary edema, B/L LE edema, DOE, pleural effusions Seen by cardiology - diuresis (lasix held starting 11/11 due to increase in creatinine) lasix resumed on 11/14 (renal function labile) -limited echo repeated on admission: EF 60-65%, Gr II DD - unfortunate circumstance with limited ability to diurese in setting of brittle CKD4; patient met with palliative care on 11/14 and decided to avoid HD and instead was interested on focusing on comfort driven care (okay continuing lasix and other meds if helping but did not wish to escalate care); seen by palliative care team.  Nonoliguric acute renal failure superimposed on stage 4 chronic kidney disease ([place]) - patient has history of CKD4. Baseline creat ~ 2.7 - 2.8, eGFR 21-24 - some worsening of renal function on 11/11; most likely was overdiuresis but also at risk for ATN Currently on IV Lasix 80 mg 3 times daily. Creatinine is downtrending 3.95 from 4.09 Continue to avoid nephrotoxic agents and hypotension. 1.5 L urine output recorded in the last 24 hours Continue to monitor urine output  Chest discomfort, possibly musculoskeletal 12-lead EKG nonrevealing Continue analgesics as needed Continue to monitor on telemetry  Newly diagnosed gout Pain in his ankles bilaterally right greater than left Prednisone started, follow uric acid level. Uric acid level 10.1 on 05/12/2021   GERD without esophagitis -Continue PPI   Glaucoma -Continue home eyedrops   Mixed hyperlipidemia -Continue statin      Rectal adenocarcinoma (HCC) - Recent diagnosis of rectal adenocarcinoma in 03/2021 - Patient has recently been established with Dr. Irene Chaney - Receiving radiation therapy in the outpatient  setting -Cetacaine spray working well, patient requesting this at discharge.  Also continue as needed oxycodone - does not appear to be a candidate for chemo or surgery due to cardiopulm status;  decision of patient to pursue HD would not change cancer management per oncology    Chronic respiratory failure with hypoxia ([place]) - on chronic 4L  O2 at home Maintain O2 saturation greater than 90%.   Type 2 diabetes mellitus with stage 4 chronic kidney disease, with long-term current use of insulin (HCC) - Hemoglobin A1C performed in October was 5.9% Continue diabetic diet   OSA on CPAP - CPAP QHS per home regimen   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[place]) - patient has carried this diagnosis previously; repeat echo this admission showing a "global longitudinal strain". This appears to raise some consideration that underlying amyloid may be playing a factor - cardiology following; patient will have further testing likely outpt with possible PYP scan; follow up SPEP/UPEP as well   Hyperphosphatemia secondary to worsening acute renal insufficiency Serum phosphorus 5.1   Resolved hypertensive crisis-resolved as of 05/04/2021 - Patient presenting with marked markedly elevated blood pressures as high as 232/141 - Presence of acute cardiogenic pulmonary edema and elevated troponin with chest pain are concerning for acute hypertensive emergency - s/p NTG and labetalol; restarted on home meds also - BP responded but still above goal (imdur increased to 90 mg daily on 11/10 per cardiology) Continue amlodipine, Coreg, doxazosin, hydralazine, Imdur   Elevated troponin level not due myocardial infarction- -Suspect demand ischemia Cardiology following Management per cardiology   Anemia of chronic disease Hemoglobin 7.8> 7.5. No overt bleeding Continue to monitor     Old records reviewed in assessment of this patient   Antimicrobials: None.     DVT prophylaxis: Lovenox subcu daily.   Code Status:   Code Status: DNR   Disposition Plan:  Status is: Inpatient   Patient will require at least 2 midnights for further evaluation and treatment of present  condition.     Objective: Vitals:   05/12/21 0500 05/12/21 0546 05/12/21 0752 05/12/21 1224  BP:  (!) 160/66 (!) 159/68 (!) 150/63  Pulse:  62 63 62  Resp:  '20 18 18  ' Temp:  98.5 F (36.9 C) 98.6 F (37 C) 98.6 F (37 C)  TempSrc:  Oral Oral Oral  SpO2:  95% 100% 99%  Weight: 76.8 kg     Height:        Intake/Output Summary (Last 24 hours) at 05/12/2021 1406 Last data filed at 05/12/2021 0900 Gross per 24 hour  Intake 720 ml  Output 1450 ml  Net -730 ml   Filed Weights   05/10/21 0534 05/11/21 0526 05/12/21 0500  Weight: 78 kg 77.7 kg 76.8 kg    Exam:  General: 70 y.o. year-old male well-developed well-nourished in no acute distress.  He is alert and oriented x3.  Hard of hearing Cardiovascular: Regular rate and rhythm no rubs or gallops. Respiratory: Clear to auscultation no wheezes or rales. Abdomen: Soft nontender normal bowel sounds present. Musculoskeletal: 1+ pitting edema lower EXTR bilaterally, improved.   Skin: Hyperpigmentation noted in lower extremities bilaterally.  Ankles are tender right greater than left. Psychiatry: Mood is appropriate for condition and setting.  Data Reviewed: CBC: Recent Labs  Lab 05/06/21 0525 05/07/21 0434 05/10/21 0717 05/12/21 0549  WBC 3.3* 3.6* 3.9* 4.2  NEUTROABS 2.3 2.4  --   --   HGB 7.9* 7.6* 7.8* 7.5*  HCT 25.5* 24.8* 25.3*  25.1*  MCV 98.1 98.0 97.3 98.4  PLT 164 167 200 594   Basic Metabolic Panel: Recent Labs  Lab 05/06/21 0525 05/07/21 0434 05/08/21 0423 05/09/21 0903 05/10/21 0717 05/12/21 0441  NA 136 137 135 135 135 136  K 4.4 4.9 4.6 4.3 4.9 4.4  CL 105 104 101 100 98 98  CO2 '27 27 27 28 30 31  ' GLUCOSE 80 92 93 227* 125* 111*  BUN 65* 76* 75* 84* 84* 97*  CREATININE 3.36* 3.78* 3.62* 3.59* 4.09* 3.95*  CALCIUM 8.0* 8.1* 8.3* 8.4* 8.4* 8.5*  MG 2.2 2.5* 2.4  --  2.4  --   PHOS  --   --   --   --  5.1* 4.5   GFR: Estimated Creatinine Clearance: 16.4 mL/min (A) (by C-G formula based on SCr  of 3.95 mg/dL (H)). Liver Function Tests: Recent Labs  Lab 05/10/21 0717 05/12/21 0441  ALBUMIN 2.8* 2.7*   No results for input(s): LIPASE, AMYLASE in the last 168 hours. No results for input(s): AMMONIA in the last 168 hours. Coagulation Profile: No results for input(s): INR, PROTIME in the last 168 hours. Cardiac Enzymes: No results for input(s): CKTOTAL, CKMB, CKMBINDEX, TROPONINI in the last 168 hours. BNP (last 3 results) No results for input(s): PROBNP in the last 8760 hours. HbA1C: No results for input(s): HGBA1C in the last 72 hours. CBG: Recent Labs  Lab 05/11/21 1149 05/11/21 1638 05/11/21 2204 05/12/21 0735 05/12/21 1146  GLUCAP 157* 112* 164* 134* 126*   Lipid Profile: No results for input(s): CHOL, HDL, LDLCALC, TRIG, CHOLHDL, LDLDIRECT in the last 72 hours. Thyroid Function Tests: No results for input(s): TSH, T4TOTAL, FREET4, T3FREE, THYROIDAB in the last 72 hours. Anemia Panel: No results for input(s): VITAMINB12, FOLATE, FERRITIN, TIBC, IRON, RETICCTPCT in the last 72 hours. Urine analysis:    Component Value Date/Time   COLORURINE STRAW (A) 05/01/2021 2019   APPEARANCEUR CLEAR 05/01/2021 2019   LABSPEC 1.011 05/01/2021 2019   PHURINE 7.0 05/01/2021 2019   GLUCOSEU NEGATIVE 05/01/2021 2019   HGBUR SMALL (A) 05/01/2021 2019   BILIRUBINUR NEGATIVE 05/01/2021 2019   KETONESUR NEGATIVE 05/01/2021 2019   PROTEINUR >=300 (A) 05/01/2021 2019   UROBILINOGEN 1.0 07/19/2011 1335   NITRITE NEGATIVE 05/01/2021 2019   LEUKOCYTESUR NEGATIVE 05/01/2021 2019   Sepsis Labs: '@LABRCNTIP' (procalcitonin:4,lacticidven:4)  ) No results found for this or any previous visit (from the past 240 hour(s)).     Studies: No results found.  Scheduled Meds:  amLODipine  10 mg Oral Daily   atorvastatin  20 mg Oral Daily   brimonidine  1 drop Right Eye BID   carvedilol  25 mg Oral BID WC   dorzolamide  1 drop Both Eyes BID   doxazosin  2 mg Oral QHS   enoxaparin  (LOVENOX) injection  30 mg Subcutaneous Q24H   furosemide  80 mg Intravenous Q8H   hydrALAZINE  100 mg Oral Q8H   insulin aspart  0-15 Units Subcutaneous TID AC & HS   insulin glargine-yfgn  10 Units Subcutaneous Daily   isosorbide mononitrate  90 mg Oral Daily   lactose free nutrition  237 mL Oral TID WC   latanoprost  1 drop Left Eye QHS   mouth rinse  15 mL Mouth Rinse q12n4p   multivitamin with minerals  1 tablet Oral Daily   pantoprazole  40 mg Oral BID AC   polyethylene glycol  17 g Oral Daily   predniSONE  40 mg Oral Q  breakfast   senna-docusate  2 tablet Oral BID    Continuous Infusions:   LOS: 11 days     Kayleen Memos, MD [facility] Pager [phone]  If 7PM-7AM, please contact night-coverage [email or web address] Password [email or web address] 05/12/2021, 2:06 PM

## 2021-05-13 LAB — CBC
HCT: 28.3 % — ABNORMAL LOW (ref 39.0–52.0)
Hemoglobin: 8.7 g/dL — ABNORMAL LOW (ref 13.0–17.0)
MCH: 29.7 pg (ref 26.0–34.0)
MCHC: 30.7 g/dL (ref 30.0–36.0)
MCV: 96.6 fL (ref 80.0–100.0)
Platelets: 222 10*3/uL (ref 150–400)
RBC: 2.93 MIL/uL — ABNORMAL LOW (ref 4.22–5.81)
RDW: 17.2 % — ABNORMAL HIGH (ref 11.5–15.5)
WBC: 4.7 10*3/uL (ref 4.0–10.5)
nRBC: 0 % (ref 0.0–0.2)

## 2021-05-13 LAB — RENAL FUNCTION PANEL
Albumin: 2.9 g/dL — ABNORMAL LOW (ref 3.5–5.0)
Anion gap: 8 (ref 5–15)
BUN: 96 mg/dL — ABNORMAL HIGH (ref 8–23)
CO2: 31 mmol/L (ref 22–32)
Calcium: 9 mg/dL (ref 8.9–10.3)
Chloride: 98 mmol/L (ref 98–111)
Creatinine, Ser: 3.9 mg/dL — ABNORMAL HIGH (ref 0.61–1.24)
GFR, Estimated: 16 mL/min — ABNORMAL LOW (ref >60)
Glucose, Bld: 130 mg/dL — ABNORMAL HIGH (ref 70–99)
Phosphorus: 4.6 mg/dL (ref 2.5–4.6)
Potassium: 4.4 mmol/L (ref 3.5–5.1)
Sodium: 137 mmol/L (ref 135–145)

## 2021-05-13 LAB — BLOOD GAS, VENOUS
Acid-Base Excess: 6.6 mmol/L — ABNORMAL HIGH (ref 0.0–2.0)
Bicarbonate: 31.9 mmol/L — ABNORMAL HIGH (ref 20.0–28.0)
O2 Saturation: 88.8 %
Patient temperature: 98.6
pCO2, Ven: 53.6 mmHg (ref 44.0–60.0)
pH, Ven: 7.393 (ref 7.250–7.430)
pO2, Ven: 60 mmHg — ABNORMAL HIGH (ref 32.0–45.0)

## 2021-05-13 LAB — GLUCOSE, CAPILLARY
Glucose-Capillary: 140 mg/dL — ABNORMAL HIGH (ref 70–99)
Glucose-Capillary: 287 mg/dL — ABNORMAL HIGH (ref 70–99)
Glucose-Capillary: 212 mg/dL — ABNORMAL HIGH (ref 70–99)
Glucose-Capillary: 165 mg/dL — ABNORMAL HIGH (ref 70–99)

## 2021-05-13 NOTE — Progress Notes (Signed)
PROGRESS NOTE  SANDEEP RADELL FAO:[id number] DOB: [birth year] DOA: 05/01/2021 PCP: [name], MD  HPI/Recap of past 24 hours:  Mr. Oyster is a 70  yo male with PMH rectal adenocarcinoma (dx CLN 03/2021), chronic hypoxic respiratory failure (4L at home), IDDM2, HLD, HTN, CKD4, GERD, dCHF, HCM w/o LVOT obstruction, OSA on CPAP, glaucoma who presented to [facility] ED from the cancer center due to worsening shortness of breath and nausea.  On work-up he was found to be significantly hypertensive, 232/141 with mildly elevated troponin, 113 and elevated BNP. CXR showed pulmonary edema.  He was started on Lasix as well as IV labetalol and his home antihypertensives were resumed. Admitted for diuresis and further blood pressure control. Renal function did not tolerate diuresis.  He underwent evaluation with palliative care due to his decline and lack of improvement.  Patient elected for foregoing hemodialysis and instead focusing on pursuing more of a hospice/palliative route.  Hospital course complicated by intermittent diffuse body pain, chest pain reproducible on palpation, and acute gout for which he was started on prednisone on 05/12/2021.  05/13/2021: Seen and examined at his bedside.  He is more interactive today.  Creatinine is downtrending this morning.  Net I&O -3.6 L.  Assessment/Plan: Principal Problem:   Acute on chronic diastolic CHF (congestive heart failure) (HCC) Active Problems:   Acute renal failure superimposed on stage 4 chronic kidney disease (HCC)   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HCC)   OSA on CPAP   Type 2 diabetes mellitus with stage 4 chronic kidney disease, with long-term current use of insulin (HCC)   Chronic respiratory failure with hypoxia (HCC)   Rectal adenocarcinoma (HCC)   Mixed hyperlipidemia   Glaucoma   GERD without esophagitis  Acute on chronic diastolic CHF (congestive heart failure) ([place]) - patient presenting with symptoms of worsening shortness  of breath and orthopnea - also noted to have pulmonary edema, B/L LE edema, DOE, pleural effusions Seen by cardiology - diuresis (lasix held starting 11/11 due to increase in creatinine) lasix resumed on 11/14 (renal function labile) -limited echo repeated on admission: EF 60-65%, Gr II DD - unfortunate circumstance with limited ability to diurese in setting of brittle CKD4; patient met with palliative care on 11/14 and decided to avoid HD and instead was interested on focusing on comfort driven care (okay continuing lasix and other meds if helping but did not wish to escalate care); seen by palliative care team. Net INO -3.6 L.  Anemia of chronic disease Hemoglobin 7.8> 7.5> 8.7. Hemoglobin uptrending, no overt bleeding.  Nonoliguric acute renal failure superimposed on stage 4 chronic kidney disease ([place]) - patient has history of CKD4. Baseline creat ~ 2.7 - 2.8, eGFR 21-24 - some worsening of renal function on 11/11; most likely was overdiuresis but also at risk for ATN Currently on IV Lasix 80 mg 3 times daily. Creatinine is downtrending 3.90 from 3.95 from 4.09 Continue to avoid nephrotoxic agents and hypotension. 1.5 L urine output recorded in the last 24 hours Continue to monitor urine output  Newly diagnosed gout Pain in his ankles bilaterally right greater than left Prednisone started, follow uric acid level. Uric acid level 10.1 on 05/12/2021 Continue prednisone.  Intermittent chest discomfort, possibly musculoskeletal 12-lead EKG nonrevealing Continue analgesics as needed Continue to monitor on telemetry  GERD without esophagitis -Continue PPI   Glaucoma -Continue home eyedrops   Mixed hyperlipidemia -Continue statin      Rectal adenocarcinoma (HCC) - Recent diagnosis of rectal adenocarcinoma in  03/2021 - Patient has recently been established with Dr. [name] - Receiving radiation therapy in the outpatient setting -Cetacaine spray working well, patient requesting  this at discharge.  Also continue as needed oxycodone - does not appear to be a candidate for chemo or surgery due to cardiopulm status; decision of patient to pursue HD would not change cancer management per oncology    Chronic respiratory failure with hypoxia ([place]) - on chronic 4L [place] O2 at home Maintain O2 saturation greater than 90%.   Type 2 diabetes mellitus with stage 4 chronic kidney disease, with long-term current use of insulin (HCC) - Hemoglobin A1C performed in October was 5.9% Continue diabetic diet   OSA on CPAP - CPAP QHS per home regimen   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[place]) - patient has carried this diagnosis previously; repeat echo this admission showing a "global longitudinal strain". This appears to raise some consideration that underlying amyloid may be playing a factor - cardiology following; patient will have further testing likely outpt with possible PYP scan; follow up SPEP/UPEP as well   Hyperphosphatemia secondary to worsening acute renal insufficiency Serum phosphorus 5.1   Resolved hypertensive crisis-resolved as of 05/04/2021 - Patient presenting with marked markedly elevated blood pressures as high as 232/141 - Presence of acute cardiogenic pulmonary edema and elevated troponin with chest pain are concerning for acute hypertensive emergency - s/p NTG and labetalol; restarted on home meds also - BP responded but still above goal (imdur increased to 90 mg daily on 11/10 per cardiology) Continue amlodipine, Coreg, doxazosin, hydralazine, Imdur   Elevated troponin level not due myocardial infarction- -Suspect demand ischemia Cardiology following Management per cardiology        Old records reviewed in assessment of this patient   Antimicrobials: None.     DVT prophylaxis: Lovenox subcu daily.   Code Status:   Code Status: DNR   Disposition Plan:  Status is: Inpatient   Patient will require at least 2 midnights for further  evaluation and treatment of present condition.     Objective: Vitals:   05/12/21 2331 05/13/21 0134 05/13/21 0520 05/13/21 1233  BP:   (!) 157/69 (!) 135/59  Pulse:   66 61  Resp: '18  18 18  ' Temp:   98.5 F (36.9 C) 98.5 F (36.9 C)  TempSrc:   Oral Oral  SpO2:  (!) 68% 92% 100%  Weight:      Height:        Intake/Output Summary (Last 24 hours) at 05/13/2021 1420 Last data filed at 05/13/2021 1239 Gross per 24 hour  Intake --  Output 650 ml  Net -650 ml   Filed Weights   05/10/21 0534 05/11/21 0526 05/12/21 0500  Weight: 78 kg 77.7 kg 76.8 kg    Exam:  General: 70 y.o. year-old male frail-appearing no acute distress.  He is alert and oriented x3. Cardiovascular: Regular rate and rhythm no rubs or gallops.  Respiratory: Clear to auscultation no wheezes or rales. Abdomen: Soft nontender normal bowel sounds present.. Musculoskeletal: Improved bilateral lower extremity edema.  Skin: Hyperpigmentation noted in lower extremities bilaterally.  Tenderness in ankles improved. Psychiatry: Mood is appropriate for condition and setting.  Data Reviewed: CBC: Recent Labs  Lab 05/07/21 0434 05/10/21 0717 05/12/21 0549 05/13/21 0610  WBC 3.6* 3.9* 4.2 4.7  NEUTROABS 2.4  --   --   --   HGB 7.6* 7.8* 7.5* 8.7*  HCT 24.8* 25.3* 25.1* 28.3*  MCV 98.0 97.3 98.4 96.6  PLT  167 200 205 161   Basic Metabolic Panel: Recent Labs  Lab 05/07/21 0434 05/08/21 0423 05/09/21 0903 05/10/21 0717 05/12/21 0441 05/13/21 0610  NA 137 135 135 135 136 137  K 4.9 4.6 4.3 4.9 4.4 4.4  CL 104 101 100 98 98 98  CO2 '27 27 28 30 31 31  ' GLUCOSE 92 93 227* 125* 111* 130*  BUN 76* 75* 84* 84* 97* 96*  CREATININE 3.78* 3.62* 3.59* 4.09* 3.95* 3.90*  CALCIUM 8.1* 8.3* 8.4* 8.4* 8.5* 9.0  MG 2.5* 2.4  --  2.4  --   --   PHOS  --   --   --  5.1* 4.5 4.6   GFR: Estimated Creatinine Clearance: 16.6 mL/min (A) (by C-G formula based on SCr of 3.9 mg/dL (H)). Liver Function Tests: Recent Labs   Lab 05/10/21 0717 05/12/21 0441 05/13/21 0610  ALBUMIN 2.8* 2.7* 2.9*   No results for input(s): LIPASE, AMYLASE in the last 168 hours. No results for input(s): AMMONIA in the last 168 hours. Coagulation Profile: No results for input(s): INR, PROTIME in the last 168 hours. Cardiac Enzymes: No results for input(s): CKTOTAL, CKMB, CKMBINDEX, TROPONINI in the last 168 hours. BNP (last 3 results) No results for input(s): PROBNP in the last 8760 hours. HbA1C: No results for input(s): HGBA1C in the last 72 hours. CBG: Recent Labs  Lab 05/12/21 1146 05/12/21 1656 05/12/21 2153 05/13/21 0805 05/13/21 1123  GLUCAP 126* 124* 187* 140* 287*   Lipid Profile: No results for input(s): CHOL, HDL, LDLCALC, TRIG, CHOLHDL, LDLDIRECT in the last 72 hours. Thyroid Function Tests: No results for input(s): TSH, T4TOTAL, FREET4, T3FREE, THYROIDAB in the last 72 hours. Anemia Panel: No results for input(s): VITAMINB12, FOLATE, FERRITIN, TIBC, IRON, RETICCTPCT in the last 72 hours. Urine analysis:    Component Value Date/Time   COLORURINE STRAW (A) 05/01/2021 2019   APPEARANCEUR CLEAR 05/01/2021 2019   LABSPEC 1.011 05/01/2021 2019   PHURINE 7.0 05/01/2021 2019   GLUCOSEU NEGATIVE 05/01/2021 2019   HGBUR SMALL (A) 05/01/2021 2019   BILIRUBINUR NEGATIVE 05/01/2021 2019   KETONESUR NEGATIVE 05/01/2021 2019   PROTEINUR >=300 (A) 05/01/2021 2019   UROBILINOGEN 1.0 07/19/2011 1335   NITRITE NEGATIVE 05/01/2021 2019   LEUKOCYTESUR NEGATIVE 05/01/2021 2019   Sepsis Labs: '@LABRCNTIP' (procalcitonin:4,lacticidven:4)  ) No results found for this or any previous visit (from the past 240 hour(s)).     Studies: No results found.  Scheduled Meds:  amLODipine  10 mg Oral Daily   atorvastatin  20 mg Oral Daily   brimonidine  1 drop Right Eye BID   carvedilol  25 mg Oral BID WC   dorzolamide  1 drop Both Eyes BID   doxazosin  2 mg Oral QHS   enoxaparin (LOVENOX) injection  30 mg Subcutaneous  Q24H   furosemide  80 mg Intravenous Q8H   hydrALAZINE  100 mg Oral Q8H   insulin aspart  0-15 Units Subcutaneous TID AC & HS   insulin glargine-yfgn  10 Units Subcutaneous Daily   isosorbide mononitrate  90 mg Oral Daily   lactose free nutrition  237 mL Oral TID WC   latanoprost  1 drop Left Eye QHS   mouth rinse  15 mL Mouth Rinse q12n4p   multivitamin with minerals  1 tablet Oral Daily   pantoprazole  40 mg Oral BID AC   polyethylene glycol  17 g Oral Daily   predniSONE  40 mg Oral Q breakfast   senna-docusate  2  tablet Oral BID    Continuous Infusions:   LOS: 12 days     Kayleen Memos, MD [facility] Pager [phone]  If 7PM-7AM, please contact night-coverage [email or web address] Password [email or web address] 05/13/2021, 2:20 PM

## 2021-05-13 NOTE — Progress Notes (Signed)
Pt continues to remove oxygen despite education provided. Pt informed of importance of keeping O2 on as his SpO2 drops below 70 when off. Pt verbalized understanding.

## 2021-05-14 ENCOUNTER — Inpatient Hospital Stay: Payer: PPO | Admitting: Nurse Practitioner

## 2021-05-14 ENCOUNTER — Inpatient Hospital Stay: Payer: PPO | Admitting: Hematology

## 2021-05-14 ENCOUNTER — Inpatient Hospital Stay: Payer: PPO

## 2021-05-14 LAB — RENAL FUNCTION PANEL
Albumin: 3.2 g/dL — ABNORMAL LOW (ref 3.5–5.0)
Anion gap: 8 (ref 5–15)
BUN: 122 mg/dL — ABNORMAL HIGH (ref 8–23)
CO2: 32 mmol/L (ref 22–32)
Calcium: 9.2 mg/dL (ref 8.9–10.3)
Chloride: 97 mmol/L — ABNORMAL LOW (ref 98–111)
Creatinine, Ser: 3.89 mg/dL — ABNORMAL HIGH (ref 0.61–1.24)
GFR, Estimated: 16 mL/min — ABNORMAL LOW (ref >60)
Glucose, Bld: 139 mg/dL — ABNORMAL HIGH (ref 70–99)
Phosphorus: 4.4 mg/dL (ref 2.5–4.6)
Potassium: 4.6 mmol/L (ref 3.5–5.1)
Sodium: 137 mmol/L (ref 135–145)

## 2021-05-14 LAB — GLUCOSE, CAPILLARY
Glucose-Capillary: 148 mg/dL — ABNORMAL HIGH (ref 70–99)
Glucose-Capillary: 169 mg/dL — ABNORMAL HIGH (ref 70–99)
Glucose-Capillary: 142 mg/dL — ABNORMAL HIGH (ref 70–99)
Glucose-Capillary: 269 mg/dL — ABNORMAL HIGH (ref 70–99)

## 2021-05-14 NOTE — Care Management Important Message (Signed)
Important Message  Patient Details IM Letter placed in Patients room. Name: Robert Chaney MRN: [id number] Date of Birth: 07/28/1950   Medicare Important Message Given:  Yes     Kerin Salen 05/14/2021, 1:17 PM

## 2021-05-14 NOTE — Progress Notes (Signed)
PROGRESS NOTE  SAMEL BRUNA FSF:[id number] DOB: July 21, 1950 DOA: 05/01/2021 PCP: [name], MD  HPI/Recap of past 24 hours:  Mr. Hanssen is a 70  yo male with PMH rectal adenocarcinoma (dx CLN 03/2021), chronic hypoxic respiratory failure (4L at home), IDDM2, HLD, HTN, CKD4, GERD, dCHF, HCM w/o LVOT obstruction, OSA on CPAP, glaucoma who presented to [facility] ED from the cancer center due to worsening shortness of breath and nausea.  On work-up he was found to be significantly hypertensive, 232/141 with mildly elevated troponin, 113 and elevated BNP. CXR showed pulmonary edema.  He was started on Lasix as well as IV labetalol and his home antihypertensives were resumed. Admitted for diuresis and further blood pressure control. Renal function did not tolerate diuresis.  He underwent evaluation with palliative care due to his decline and lack of improvement.  Patient elected for foregoing hemodialysis and instead focusing on pursuing more of a hospice/palliative route.  Hospital course complicated by intermittent diffuse body pain, chest pain reproducible on palpation, and acute gout for which he was started on prednisone on 05/12/2021.  05/14/2021: Seen and examined at his bedside.  He feels better this morning.  Has no new complaints.  Assessment/Plan: Principal Problem:   Acute on chronic diastolic CHF (congestive heart failure) (HCC) Active Problems:   Acute renal failure superimposed on stage 4 chronic kidney disease (HCC)   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HCC)   OSA on CPAP   Type 2 diabetes mellitus with stage 4 chronic kidney disease, with long-term current use of insulin (HCC)   Chronic respiratory failure with hypoxia (HCC)   Rectal adenocarcinoma (HCC)   Mixed hyperlipidemia   Glaucoma   GERD without esophagitis  Acute on chronic diastolic CHF (congestive heart failure) ([place]) - patient presenting with symptoms of worsening shortness of breath and orthopnea - also  noted to have pulmonary edema, B/L LE edema, DOE, pleural effusions Seen by cardiology - diuresis (lasix held starting 11/11 due to increase in creatinine) lasix resumed on 11/14 (renal function labile) -limited echo repeated on admission: EF 60-65%, Gr II DD - unfortunate circumstance with limited ability to diurese in setting of brittle CKD4; patient met with palliative care on 11/14 and decided to avoid HD and instead was interested on focusing on comfort driven care (okay continuing lasix and other meds if helping but did not wish to escalate care); seen by palliative care team. Net I&O -3.6 L.  Anemia of chronic disease Hemoglobin 7.8> 7.5> 8.7. Hemoglobin uptrending, no overt bleeding.  Nonoliguric acute renal failure superimposed on stage 4 chronic kidney disease ([place]), improving - patient has history of CKD4. Baseline creat ~ 2.7 - 2.8, eGFR 21-24 - some worsening of renal function on 11/11; most likely was overdiuresis but also at risk for ATN Currently on IV Lasix 80 mg 3 times daily. Creatinine is downtrending 3.90 from 3.95 from 4.09 Continue to avoid nephrotoxic agents and hypotension. 1.5 L urine output recorded in the last 24 hours Continue to monitor urine output  Newly diagnosed gout Pain in his ankles bilaterally right greater than left Continue prednisone started, follow uric acid level. Uric acid level 10.1 on 05/12/2021 Continue prednisone.  Intermittent chest discomfort, possibly musculoskeletal 12-lead EKG nonrevealing Continue analgesics as needed Continue to monitor on telemetry  GERD without esophagitis -Continue PPI   Glaucoma -Continue home eyedrops   Mixed hyperlipidemia -Continue statin      Rectal adenocarcinoma ([place]) - Recent diagnosis of rectal adenocarcinoma in 03/2021 - Patient has  recently been established with Dr. [name] - Receiving radiation therapy in the outpatient setting -Cetacaine spray working well, patient requesting this at  discharge.  Also continue as needed oxycodone - does not appear to be a candidate for chemo or surgery due to cardiopulm status; decision of patient to pursue HD would not change cancer management per oncology    Chronic respiratory failure with hypoxia ([place]) - on chronic 4L [place] O2 at home Maintain O2 saturation greater than 90%.   Type 2 diabetes mellitus with stage 4 chronic kidney disease, with long-term current use of insulin (HCC) - Hemoglobin A1C performed in October was 5.9% Continue diabetic diet   OSA on CPAP - CPAP QHS per home regimen   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[place]) - patient has carried this diagnosis previously; repeat echo this admission showing a "global longitudinal strain". This appears to raise some consideration that underlying amyloid may be playing a factor - cardiology following; patient will have further testing likely outpt with possible PYP scan; follow up SPEP/UPEP as well   Hyperphosphatemia secondary to worsening acute renal insufficiency Serum phosphorus 5.1   Resolved hypertensive crisis-resolved as of 05/04/2021 - Patient presenting with marked markedly elevated blood pressures as high as 232/141 - Presence of acute cardiogenic pulmonary edema and elevated troponin with chest pain are concerning for acute hypertensive emergency - s/p NTG and labetalol; restarted on home meds also - BP responded but still above goal (imdur increased to 90 mg daily on 11/10 per cardiology) Continue amlodipine, Coreg, doxazosin, hydralazine, Imdur   Elevated troponin level not due myocardial infarction- -Suspect demand ischemia Cardiology following Management per cardiology   Goals of care Palliative care team following.      Old records reviewed in assessment of this patient   Antimicrobials: None.     DVT prophylaxis: Lovenox subcu daily.   Code Status:   Code Status: DNR   Disposition Plan:  Status is: Inpatient   Patient will  require at least 2 midnights for further evaluation and treatment of present condition.     Objective: Vitals:   05/13/21 2039 05/13/21 2341 05/14/21 0554 05/14/21 1322  BP: (!) 142/69  (!) 146/68 (!) 134/58  Pulse: 62  (!) 58 (!) 58  Resp: '20 16 16 18  ' Temp: 98.7 F (37.1 C)  (!) 97.4 F (36.3 C) 97.9 F (36.6 C)  TempSrc: Oral  Oral Oral  SpO2: 100% 100% 100% 97%  Weight:      Height:        Intake/Output Summary (Last 24 hours) at 05/14/2021 1406 Last data filed at 05/14/2021 1041 Gross per 24 hour  Intake 540 ml  Output 600 ml  Net -60 ml   Filed Weights   05/10/21 0534 05/11/21 0526 05/12/21 0500  Weight: 78 kg 77.7 kg 76.8 kg    Exam:  General: 70 y.o. year-old male frail-appearing no acute distress.  He is alert and oriented x3.   Cardiovascular: Regular rate and rhythm no rubs or gallops. Respiratory: Clear to auscultation no wheezes or rales. Abdomen: Soft nontender normal bowel sounds present.  Musculoskeletal: Improved bilateral lower extremity edema.  Skin: Hyperpigmentation noted in lower extremities bilaterally.  Tenderness in ankles improved. Psychiatry: Mood is appropriate for condition and setting.  Data Reviewed: CBC: Recent Labs  Lab 05/10/21 0717 05/12/21 0549 05/13/21 0610  WBC 3.9* 4.2 4.7  HGB 7.8* 7.5* 8.7*  HCT 25.3* 25.1* 28.3*  MCV 97.3 98.4 96.6  PLT 200 205 222   Basic  Metabolic Panel: Recent Labs  Lab 05/08/21 0423 05/09/21 0903 05/10/21 0717 05/12/21 0441 05/13/21 0610 05/14/21 0647  NA 135 135 135 136 137 137  K 4.6 4.3 4.9 4.4 4.4 4.6  CL 101 100 98 98 98 97*  CO2 '27 28 30 31 31 ' 32  GLUCOSE 93 227* 125* 111* 130* 139*  BUN 75* 84* 84* 97* 96* 122*  CREATININE 3.62* 3.59* 4.09* 3.95* 3.90* 3.89*  CALCIUM 8.3* 8.4* 8.4* 8.5* 9.0 9.2  MG 2.4  --  2.4  --   --   --   PHOS  --   --  5.1* 4.5 4.6 4.4   GFR: Estimated Creatinine Clearance: 16.6 mL/min (A) (by C-G formula based on SCr of 3.89 mg/dL (H)). Liver  Function Tests: Recent Labs  Lab 05/10/21 0717 05/12/21 0441 05/13/21 0610 05/14/21 0647  ALBUMIN 2.8* 2.7* 2.9* 3.2*   No results for input(s): LIPASE, AMYLASE in the last 168 hours. No results for input(s): AMMONIA in the last 168 hours. Coagulation Profile: No results for input(s): INR, PROTIME in the last 168 hours. Cardiac Enzymes: No results for input(s): CKTOTAL, CKMB, CKMBINDEX, TROPONINI in the last 168 hours. BNP (last 3 results) No results for input(s): PROBNP in the last 8760 hours. HbA1C: No results for input(s): HGBA1C in the last 72 hours. CBG: Recent Labs  Lab 05/13/21 1123 05/13/21 1611 05/13/21 2036 05/14/21 0744 05/14/21 1136  GLUCAP 287* 212* 165* 148* 169*   Lipid Profile: No results for input(s): CHOL, HDL, LDLCALC, TRIG, CHOLHDL, LDLDIRECT in the last 72 hours. Thyroid Function Tests: No results for input(s): TSH, T4TOTAL, FREET4, T3FREE, THYROIDAB in the last 72 hours. Anemia Panel: No results for input(s): VITAMINB12, FOLATE, FERRITIN, TIBC, IRON, RETICCTPCT in the last 72 hours. Urine analysis:    Component Value Date/Time   COLORURINE STRAW (A) 05/01/2021 2019   APPEARANCEUR CLEAR 05/01/2021 2019   LABSPEC 1.011 05/01/2021 2019   PHURINE 7.0 05/01/2021 2019   GLUCOSEU NEGATIVE 05/01/2021 2019   HGBUR SMALL (A) 05/01/2021 2019   BILIRUBINUR NEGATIVE 05/01/2021 2019   KETONESUR NEGATIVE 05/01/2021 2019   PROTEINUR >=300 (A) 05/01/2021 2019   UROBILINOGEN 1.0 07/19/2011 1335   NITRITE NEGATIVE 05/01/2021 2019   LEUKOCYTESUR NEGATIVE 05/01/2021 2019   Sepsis Labs: '@LABRCNTIP' (procalcitonin:4,lacticidven:4)  ) No results found for this or any previous visit (from the past 240 hour(s)).     Studies: No results found.  Scheduled Meds:  amLODipine  10 mg Oral Daily   atorvastatin  20 mg Oral Daily   brimonidine  1 drop Right Eye BID   carvedilol  25 mg Oral BID WC   dorzolamide  1 drop Both Eyes BID   doxazosin  2 mg Oral QHS    enoxaparin (LOVENOX) injection  30 mg Subcutaneous Q24H   furosemide  80 mg Intravenous Q8H   hydrALAZINE  100 mg Oral Q8H   insulin aspart  0-15 Units Subcutaneous TID AC & HS   insulin glargine-yfgn  10 Units Subcutaneous Daily   isosorbide mononitrate  90 mg Oral Daily   lactose free nutrition  237 mL Oral TID WC   latanoprost  1 drop Left Eye QHS   mouth rinse  15 mL Mouth Rinse q12n4p   multivitamin with minerals  1 tablet Oral Daily   pantoprazole  40 mg Oral BID AC   polyethylene glycol  17 g Oral Daily   predniSONE  40 mg Oral Q breakfast   senna-docusate  2 tablet Oral BID  Continuous Infusions:   LOS: 13 days     Kayleen Memos, MD [facility] Pager [phone]  If 7PM-7AM, please contact night-coverage [email or web address] Password [email or web address] 05/14/2021, 2:06 PM

## 2021-05-14 NOTE — TOC Progression Note (Signed)
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  On work-up he was found to be significantly hypertensive, 232/141 with mildly elevated troponin, 113 and elevated BNP. CXR showed pulmonary edema.  He was started on Lasix as well as IV labetalol and his home antihypertensives were resumed. Admitted for diuresis and further blood pressure control. Renal function did not tolerate diuresis.  He underwent evaluation with palliative care due to his decline and lack of improvement.  Patient elected for foregoing hemodialysis and instead focusing on pursuing more of a hospice/palliative route.  Hospital course complicated by intermittent diffuse body pain, chest pain reproducible on palpation, and acute gout for which he was started on prednisone on 05/12/2021.  TOC PLAN OF CARE: Following for toc needs lives alone, plan is to send home with self care Following progression: Bun122 creatine is 3.89   Expected Discharge Plan: Home/Self Care Barriers to Discharge: Continued Medical Work up  Expected Discharge Plan and Services Expected Discharge Plan: Home/Self Care   Discharge Planning Services: CM Consult   Living arrangements for the past 2 months: Single Family Home                                       Social Determinants of Health (SDOH) Interventions    Readmission Risk Interventions Readmission Risk Prevention Plan 05/04/2021 04/11/2021 03/22/2021  Transportation Screening Complete Complete  Complete  PCP or Specialist Appt within 3-5 Days - - -  HRI or [place] for [place] - - -  Medication Review Press photographer) Complete Complete Complete  PCP or Specialist appointment within 3-5 days of discharge Complete Complete Complete  HRI or [place] Complete Complete Complete  SW Recovery Care/Counseling Consult Complete Complete Complete  Palliative Care Screening Not Applicable Not Applicable Not [place] Not Applicable Not Applicable Not Applicable  Some recent data might be hidden

## 2021-05-15 LAB — CBC
HCT: 28.3 % — ABNORMAL LOW (ref 39.0–52.0)
Hemoglobin: 8.7 g/dL — ABNORMAL LOW (ref 13.0–17.0)
MCH: 30.2 pg (ref 26.0–34.0)
MCHC: 30.7 g/dL (ref 30.0–36.0)
MCV: 98.3 fL (ref 80.0–100.0)
Platelets: 212 10*3/uL (ref 150–400)
RBC: 2.88 MIL/uL — ABNORMAL LOW (ref 4.22–5.81)
RDW: 17.2 % — ABNORMAL HIGH (ref 11.5–15.5)
WBC: 6.3 10*3/uL (ref 4.0–10.5)
nRBC: 0 % (ref 0.0–0.2)

## 2021-05-15 LAB — RENAL FUNCTION PANEL
Albumin: 2.9 g/dL — ABNORMAL LOW (ref 3.5–5.0)
Anion gap: 9 (ref 5–15)
BUN: 118 mg/dL — ABNORMAL HIGH (ref 8–23)
CO2: 32 mmol/L (ref 22–32)
Calcium: 8.8 mg/dL — ABNORMAL LOW (ref 8.9–10.3)
Chloride: 96 mmol/L — ABNORMAL LOW (ref 98–111)
Creatinine, Ser: 3.78 mg/dL — ABNORMAL HIGH (ref 0.61–1.24)
GFR, Estimated: 16 mL/min — ABNORMAL LOW (ref >60)
Glucose, Bld: 189 mg/dL — ABNORMAL HIGH (ref 70–99)
Phosphorus: 4.2 mg/dL (ref 2.5–4.6)
Potassium: 4.5 mmol/L (ref 3.5–5.1)
Sodium: 137 mmol/L (ref 135–145)

## 2021-05-15 LAB — GLUCOSE, CAPILLARY
Glucose-Capillary: 153 mg/dL — ABNORMAL HIGH (ref 70–99)
Glucose-Capillary: 212 mg/dL — ABNORMAL HIGH (ref 70–99)
Glucose-Capillary: 264 mg/dL — ABNORMAL HIGH (ref 70–99)
Glucose-Capillary: 271 mg/dL — ABNORMAL HIGH (ref 70–99)

## 2021-05-15 MED ORDER — HYDRALAZINE HCL 20 MG/ML IJ SOLN: 5.0000 mg | Freq: Three times a day (TID) | INTRAMUSCULAR | Status: DC | PRN

## 2021-05-15 NOTE — Progress Notes (Signed)
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 ED from the cancer center due to worsening shortness of breath and nausea.  On work-up he was found to be significantly hypertensive, 232/141 with mildly elevated troponin, 113 and elevated BNP. CXR showed pulmonary edema.  He was started on Lasix as well as IV labetalol and his home antihypertensives were resumed. Admitted for diuresis and further blood pressure control. Renal function did not tolerate diuresis.  He underwent evaluation with palliative care due to his decline and lack of improvement.  Patient elected for foregoing hemodialysis and instead focusing on pursuing more of a hospice/palliative route.  Hospital course complicated by intermittent diffuse body pain, chest pain reproducible on palpation, and acute gout for which he was started on prednisone on 05/12/2021.  Seen by palliative care team, discussion held with the patient and his brother.  Decision was made to treat the treatable with plan to discharge to home with outpatient hospice possibly on 05/16/2021.  05/15/2021: Seen and examined at his bedside.  He had rectal pain.  Which improved after Cetacaine, rectal spray.  Assessment/Plan: Principal Problem:   Acute on chronic diastolic CHF (congestive heart failure) (HCC) Active Problems:   Acute renal failure superimposed on stage 4 chronic kidney disease (HCC)   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HCC)   OSA on CPAP   Type 2 diabetes mellitus with stage 4 chronic kidney disease, with long-term current use of insulin (HCC)   Chronic respiratory failure with hypoxia (HCC)   Rectal adenocarcinoma (HCC)   Mixed  hyperlipidemia   Glaucoma   GERD without esophagitis  Acute on chronic diastolic CHF (congestive heart failure) ([place]) - patient presented with symptoms of worsening shortness of breath, orthopnea, legs swelling. - noted to have pulmonary edema, B/L LE edema, DOE, pleural effusions.  Seen by cardiology -limited echo repeated on admission: EF 60-65%, Gr II DD - unfortunate circumstance with limited ability to diurese in setting of brittle CKD4; patient met with palliative care on 11/14 and decided to avoid HD and instead was interested on focusing on comfort driven care (okay to continue lasix and other meds if helping but did not wish to escalate care);  Net I&O -3.0 L.  Anemia of chronic disease in the setting of CKD4 Hemoglobin 7.8> 7.5> 8.7. Hemoglobin uptrending, no overt bleeding.  Nonoliguric acute renal failure superimposed on stage 4 chronic kidney disease ([place]), improving - patient has history of CKD4. Baseline creat ~ 2.7 - 2.8, eGFR 21-24 - some worsening of renal function on 11/11; most likely was overdiuresis but also at risk for ATN Currently on IV Lasix 80 mg 3 times daily. Creatinine is downtrending 3.78 from 3.90 from 3.95 from 4.09 Continue to avoid nephrotoxic agents and hypotension.  Newly diagnosed gout Pain in his ankles bilaterally right greater than left Continue prednisone Uric acid level 10.1 on 05/12/2021  Intermittent chest discomfort, reproducible on palpation, musculoskeletal 12-lead EKG nonrevealing Continue analgesics as needed  GERD without esophagitis -Continue PPI   Glaucoma -Continue home eyedrops   Mixed hyperlipidemia -Continue statin      Rectal adenocarcinoma (HCC) - Recent diagnosis of rectal adenocarcinoma in 03/2021 - Patient has recently been established with  Dr. [name] - Receiving radiation therapy in the outpatient setting -Cetacaine spray working well, patient requesting this at discharge.  Also requesting lidocaine 5% ointment  which also works well for him.   Continue as needed oxycodone - does not appear to be a candidate for chemo or surgery due to cardiopulm status; decision of patient to pursue HD would not change cancer management per oncology    Chronic respiratory failure with hypoxia ([place]) - on chronic 4L [place] O2 at home Maintain O2 saturation greater than 90%.   Type 2 diabetes mellitus with stage 4 chronic kidney disease, with long-term current use of insulin (HCC) - Hemoglobin A1C performed in October was 5.9% Continue diabetic diet   OSA on CPAP - CPAP QHS per home regimen   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[place]) - patient has carried this diagnosis previously; repeat echo this admission showing a "global longitudinal strain". This appears to raise some consideration that underlying amyloid may be playing a factor - cardiology following; patient will have further testing likely outpt with possible PYP scan; follow up SPEP/UPEP as well   Hyperphosphatemia secondary to worsening acute renal insufficiency Serum phosphorus 5.1   Resolved hypertensive crisis-resolved as of 05/04/2021 - Patient presenting with marked markedly elevated blood pressures as high as 232/141 - Presence of acute cardiogenic pulmonary edema and elevated troponin with chest pain are concerning for acute hypertensive emergency - s/p NTG and labetalol; restarted on home meds also - BP responded but still above goal (imdur increased to 90 mg daily on 11/10 per cardiology) Continue amlodipine, Coreg, doxazosin, hydralazine, Imdur   Elevated troponin level not due myocardial infarction- -Suspect demand ischemia Seen by cardiology   Goals of care Seen by palliative care team. DNR Plan to discharge to home with outpatient hospice on 05/16/2021.       Antimicrobials: None.     DVT prophylaxis: Lovenox subcu daily.   Code Status:   Code Status: DNR   Disposition Plan:  Status is: Inpatient   Patient will  require at least 2 midnights for further evaluation and treatment of present condition.     Objective: Vitals:   05/15/21 0925 05/15/21 1117 05/15/21 1120 05/15/21 1224  BP: (!) 163/135 (!) 162/137 (!) 141/63 (!) 152/64  Pulse:  70  62  Resp:  20  20  Temp:    97.9 F (36.6 C)  TempSrc:    Oral  SpO2:    100%  Weight:      Height:        Intake/Output Summary (Last 24 hours) at 05/15/2021 1451 Last data filed at 05/15/2021 1229 Gross per 24 hour  Intake 960 ml  Output 350 ml  Net 610 ml   Filed Weights   05/10/21 0534 05/11/21 0526 05/12/21 0500  Weight: 78 kg 77.7 kg 76.8 kg    Exam:  General: 70 y.o. year-old male frail-appearing no acute distress.  He is alert oriented x3.   Cardiovascular: Regular rate and rhythm no rubs or gallops. Respiratory: Clear to auscultation no wheezes or rales. Abdomen: Soft nontender normal bowel sounds present. Musculoskeletal: Improved bilateral lower extremity edema. Skin: Hyperpigmentation noted in lower extremities bilaterally.  Tenderness in ankles improved. Psychiatry: Mood is appropriate for condition and setting.  Data Reviewed: CBC: Recent Labs  Lab 05/10/21 0717 05/12/21 0549 05/13/21 0610 05/15/21 0845  WBC 3.9* 4.2 4.7 6.3  HGB 7.8* 7.5* 8.7* 8.7*  HCT 25.3* 25.1* 28.3* 28.3*  MCV 97.3 98.4 96.6 98.3  PLT 200 205  222 546   Basic Metabolic Panel: Recent Labs  Lab 05/10/21 0717 05/12/21 0441 05/13/21 0610 05/14/21 0647 05/15/21 0845  NA 135 136 137 137 137  K 4.9 4.4 4.4 4.6 4.5  CL 98 98 98 97* 96*  CO2 '30 31 31 ' 32 32  GLUCOSE 125* 111* 130* 139* 189*  BUN 84* 97* 96* 122* 118*  CREATININE 4.09* 3.95* 3.90* 3.89* 3.78*  CALCIUM 8.4* 8.5* 9.0 9.2 8.8*  MG 2.4  --   --   --   --   PHOS 5.1* 4.5 4.6 4.4 4.2   GFR: Estimated Creatinine Clearance: 17.1 mL/min (A) (by C-G formula based on SCr of 3.78 mg/dL (H)). Liver Function Tests: Recent Labs  Lab 05/10/21 0717 05/12/21 0441 05/13/21 0610  05/14/21 0647 05/15/21 0845  ALBUMIN 2.8* 2.7* 2.9* 3.2* 2.9*   No results for input(s): LIPASE, AMYLASE in the last 168 hours. No results for input(s): AMMONIA in the last 168 hours. Coagulation Profile: No results for input(s): INR, PROTIME in the last 168 hours. Cardiac Enzymes: No results for input(s): CKTOTAL, CKMB, CKMBINDEX, TROPONINI in the last 168 hours. BNP (last 3 results) No results for input(s): PROBNP in the last 8760 hours. HbA1C: No results for input(s): HGBA1C in the last 72 hours. CBG: Recent Labs  Lab 05/14/21 1136 05/14/21 1705 05/14/21 2301 05/15/21 0804 05/15/21 1114  GLUCAP 169* 142* 269* 153* 212*   Lipid Profile: No results for input(s): CHOL, HDL, LDLCALC, TRIG, CHOLHDL, LDLDIRECT in the last 72 hours. Thyroid Function Tests: No results for input(s): TSH, T4TOTAL, FREET4, T3FREE, THYROIDAB in the last 72 hours. Anemia Panel: No results for input(s): VITAMINB12, FOLATE, FERRITIN, TIBC, IRON, RETICCTPCT in the last 72 hours. Urine analysis:    Component Value Date/Time   COLORURINE STRAW (A) 05/01/2021 2019   APPEARANCEUR CLEAR 05/01/2021 2019   LABSPEC 1.011 05/01/2021 2019   PHURINE 7.0 05/01/2021 2019   GLUCOSEU NEGATIVE 05/01/2021 2019   HGBUR SMALL (A) 05/01/2021 2019   BILIRUBINUR NEGATIVE 05/01/2021 2019   KETONESUR NEGATIVE 05/01/2021 2019   PROTEINUR >=300 (A) 05/01/2021 2019   UROBILINOGEN 1.0 07/19/2011 1335   NITRITE NEGATIVE 05/01/2021 2019   LEUKOCYTESUR NEGATIVE 05/01/2021 2019   Sepsis Labs: '@LABRCNTIP' (procalcitonin:4,lacticidven:4)  ) No results found for this or any previous visit (from the past 240 hour(s)).     Studies: No results found.  Scheduled Meds:  amLODipine  10 mg Oral Daily   atorvastatin  20 mg Oral Daily   brimonidine  1 drop Right Eye BID   carvedilol  25 mg Oral BID WC   dorzolamide  1 drop Both Eyes BID   doxazosin  2 mg Oral QHS   enoxaparin (LOVENOX) injection  30 mg Subcutaneous Q24H    furosemide  80 mg Intravenous Q8H   hydrALAZINE  100 mg Oral Q8H   insulin aspart  0-15 Units Subcutaneous TID AC & HS   insulin glargine-yfgn  10 Units Subcutaneous Daily   isosorbide mononitrate  90 mg Oral Daily   lactose free nutrition  237 mL Oral TID WC   latanoprost  1 drop Left Eye QHS   mouth rinse  15 mL Mouth Rinse q12n4p   multivitamin with minerals  1 tablet Oral Daily   pantoprazole  40 mg Oral BID AC   polyethylene glycol  17 g Oral Daily   predniSONE  40 mg Oral Q breakfast   senna-docusate  2 tablet Oral BID    Continuous Infusions:   LOS: 14  days     Kayleen Memos, MD [facility] Pager [phone]  If 7PM-7AM, please contact night-coverage [email or web address] Password [email or web address] 05/15/2021, 2:51 PM

## 2021-05-15 NOTE — Progress Notes (Signed)
    Chart Reviewed and patient assessed.   Mr. Lundstrom is sitting up on the side of the bed. No acute distress noted. 4L/[place]. Appetite remains fair. Continues to complain of generalized pain and weakness. He emphasizes he is ready to go home and that his brother is waiting for him to come there.   I spoke at length with patient and his brother, [name] regarding goals of care and wishes. Alger and his brother mutually confirms plans for patient to return home with outpatient hospice support to spend what time he has left. Education provided on goals, philosophy of care, and referral process for hospice. They verbalized understanding. Mr. Mastro is requesting all conversations and arrangements be made via his brother as he is tired and does not have the energy to engage in discussions regarding disposition plans. He states "I just want to go home, not suffer, and pass away peacefully!" Emotional support provided. He becomes upset when discussing different discussions and being asked similar things over and over by providers. He shares his frustration and states he hopes the medical team can arrange for him to get home with his brother before Thursday as he can feel himself continuing to decline and get weaker and does not wish to pass away in the hospital if at all possible.   Education provided on residential hospice facility in the event his symptom needs become a challenge in the home. He and brother verbalized understanding and express if he gets to that point they would be open to residential hospice as recommended at the time.   Patient states he does have a walker and home oxygen already in the home. Would like a bedside commode due to increase weakness. No other identified equipment. Brother reports given he is here from out of state without transportation and due to Delorean's weakness would appreciation arrangements for patient to get home.   All questions answered and support provided.   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 Goal is to discharge home with outpatient hospice support and spend what time he has left amongst family and friends.  -TOC referral has been placed on 05/14/2021 (outpatient hospice). Suanne Marker, [place] [facility] [place], [facility] Liaison) aware and working on approval etc.  -Patient will need BSC and PTAR transport home at discharge.  -Patient's brother plans to reside in the home with patient with understanding he will need 24/7 support.  -PMT will continue to support and follow as needed.   Time Total: 50 min   Visit consisted of counseling and education dealing with the complex and emotionally intense issues of symptom management and palliative care in the setting of serious and potentially life-threatening illness.Greater than 50%  of this time was spent counseling and coordinating care related to the above assessment and plan.  Alda Lea, AGPCNP-BC  Palliative Medicine Team [phone]

## 2021-05-15 NOTE — Progress Notes (Addendum)
[facility]10   [facility]) Hospital Liaison Note  Received request from Transitions of Care Manager for hospice services at home after discharge. Chart and patient information under review by [facility] physician. Hospice eligibility approved.   Spoke with Robert Chaney to initiate education related to hospice philosophy, services, and team approach to care. Patient/family verbalized understanding of information given. Per discussion, the plan is for patient to discharge home via [place] on 05/16/2021.   DME needs discussed. Patient has the following equipment in the home: O2 concentrator/tanks and wheelchair.   Patient requests the following equipment for delivery: BSC.  Address verified and is correct in the chart. Brother Robert Chaney is the family member to contact to arrange time of equipment delivery.    Please send signed and completed DNR home with patient/family. Please provide prescriptions at discharge as needed to ensure ongoing symptom management.    [facility] information and contact numbers given to brother Robert Chaney.    Please call with any questions/concerns.   Zollie Pee [facility] Liaison  [phone]

## 2021-05-15 NOTE — Progress Notes (Signed)
     Mr. Blatt is resting in bed. Easily awakened. Complains of generalized pain and weakness. States his brother has been able to make it into town and is hopeful he can get home soon. Appetite remains fair. Tolerating 4L via nasal cannula.   We made multiple attempts to reach his brother while at the bedside with a goal of confirming goals of care and wishes. Keiandre is clear he would like to return home with hospice support. He does not want any aggressive interventions expresses his understanding of his overall condition and his focus is solely on his comfort for what time he has left. Support provided.   I have left a message with his brother, [name] and my number has been provided.   Mr. Rosner aware once we have been able to confirm with his brother he remains in agreement with previously expressed wishes and he assures our team he will remain in the home to provide support the medical team can then move forward with attempting to get him home as he wishes. Duwayne understands he cannot return home alone due to safety concerns.   We discussed his current pain regimen. He feels that when he does take the Dilaudid or Oxy IR his pain is controlled. No adjustments at this time.   All questions answered and support provided.   Time Total: 40 min.   Visit consisted of counseling and education dealing with the complex and emotionally intense issues of symptom management and palliative care in the setting of serious and potentially life-threatening illness.Greater than 50%  of this time was spent counseling and coordinating care related to the above assessment and plan.  Alda Lea, AGPCNP-BC  Palliative Medicine Team [phone]

## 2021-05-16 ENCOUNTER — Other Ambulatory Visit: Payer: Self-pay | Admitting: Nurse Practitioner

## 2021-05-16 ENCOUNTER — Telehealth: Payer: Self-pay

## 2021-05-16 LAB — GLUCOSE, CAPILLARY
Glucose-Capillary: 146 mg/dL — ABNORMAL HIGH (ref 70–99)
Glucose-Capillary: 287 mg/dL — ABNORMAL HIGH (ref 70–99)

## 2021-05-16 MED ORDER — DOXAZOSIN MESYLATE 2 MG PO TABS
2.0000 mg | ORAL_TABLET | Freq: Every day | ORAL | 0 refills | Status: AC
Start: 2021-05-16 — End: ?

## 2021-05-16 MED ORDER — SENNOSIDES-DOCUSATE SODIUM 8.6-50 MG PO TABS: 2.0000 | ORAL_TABLET | Freq: Two times a day (BID) | ORAL | 0 refills | Status: AC

## 2021-05-16 MED ORDER — FUROSEMIDE 40 MG PO TABS: 40.0000 mg | ORAL_TABLET | Freq: Two times a day (BID) | ORAL | 1 refills | Status: AC | PRN

## 2021-05-16 MED ORDER — BOOST PLUS PO LIQD: 237.0000 mL | Freq: Three times a day (TID) | ORAL | 90 refills | Status: AC

## 2021-05-16 MED ORDER — BUTAMBEN-TETRACAINE-BENZOCAINE 2-2-14 % EX AERO: 1.0000 | INHALATION_SPRAY | CUTANEOUS | 0 refills | Status: AC | PRN

## 2021-05-16 MED ORDER — ACETAMINOPHEN 325 MG PO TABS: 650.0000 mg | ORAL_TABLET | Freq: Four times a day (QID) | ORAL | Status: AC | PRN

## 2021-05-16 MED ORDER — OXYCODONE HCL 5 MG PO TABS: 5.0000 mg | ORAL_TABLET | Freq: Four times a day (QID) | ORAL | 0 refills | Status: AC | PRN

## 2021-05-16 MED ORDER — ONDANSETRON HCL 4 MG PO TABS: 4.0000 mg | ORAL_TABLET | Freq: Four times a day (QID) | ORAL | 0 refills | Status: AC | PRN

## 2021-05-16 MED ORDER — ISOSORBIDE MONONITRATE ER 30 MG PO TB24
90.0000 mg | ORAL_TABLET | Freq: Every day | ORAL | 0 refills | Status: AC
Start: 2021-05-17 — End: ?

## 2021-05-16 NOTE — Plan of Care (Signed)
  Problem: Education: Goal: Knowledge of General Education information will improve Description: Including pain rating scale, medication(s)/side effects and non-pharmacologic comfort measures 05/16/2021 1408 by Sherlynn Carbon, RN Outcome: Adequate for Discharge 05/16/2021 1407 by Sherlynn Carbon, RN Outcome: Adequate for Discharge   Problem: Health Behavior/Discharge Planning: Goal: Ability to manage health-related needs will improve 05/16/2021 1408 by Sherlynn Carbon, RN Outcome: Adequate for Discharge 05/16/2021 1407 by Sherlynn Carbon, RN Outcome: Adequate for Discharge   Problem: Clinical Measurements: Goal: Ability to maintain clinical measurements within normal limits will improve 05/16/2021 1408 by Sherlynn Carbon, RN Outcome: Adequate for Discharge 05/16/2021 1407 by Sherlynn Carbon, RN Outcome: Adequate for Discharge Goal: Will remain free from infection 05/16/2021 1408 by Sherlynn Carbon, RN Outcome: Adequate for Discharge 05/16/2021 1407 by Sherlynn Carbon, RN Outcome: Adequate for Discharge Goal: Diagnostic test results will improve 05/16/2021 1408 by Sherlynn Carbon, RN Outcome: Adequate for Discharge 05/16/2021 1407 by Sherlynn Carbon, RN Outcome: Adequate for Discharge Goal: Respiratory complications will improve 05/16/2021 1408 by Sherlynn Carbon, RN Outcome: Adequate for Discharge 05/16/2021 1407 by Sherlynn Carbon, RN Outcome: Adequate for Discharge Goal: Cardiovascular complication will be avoided 05/16/2021 1408 by Sherlynn Carbon, RN Outcome: Adequate for Discharge 05/16/2021 1407 by Sherlynn Carbon, RN Outcome: Adequate for Discharge   Problem: Activity: Goal: Risk for activity intolerance will decrease 05/16/2021 1408 by Sherlynn Carbon, RN Outcome: Adequate for Discharge 05/16/2021 1407 by Sherlynn Carbon, RN Outcome: Adequate for Discharge   Problem: Nutrition: Goal: Adequate nutrition will be maintained 05/16/2021 1408 by  Sherlynn Carbon, RN Outcome: Adequate for Discharge 05/16/2021 1407 by Sherlynn Carbon, RN Outcome: Adequate for Discharge   Problem: Coping: Goal: Level of anxiety will decrease 05/16/2021 1408 by Sherlynn Carbon, RN Outcome: Adequate for Discharge 05/16/2021 1407 by Sherlynn Carbon, RN Outcome: Adequate for Discharge   Problem: Elimination: Goal: Will not experience complications related to bowel motility 05/16/2021 1408 by Sherlynn Carbon, RN Outcome: Adequate for Discharge 05/16/2021 1407 by Sherlynn Carbon, RN Outcome: Adequate for Discharge Goal: Will not experience complications related to urinary retention 05/16/2021 1408 by Sherlynn Carbon, RN Outcome: Adequate for Discharge 05/16/2021 1407 by Sherlynn Carbon, RN Outcome: Adequate for Discharge   Problem: Pain Managment: Goal: General experience of comfort will improve 05/16/2021 1408 by Sherlynn Carbon, RN Outcome: Adequate for Discharge 05/16/2021 1407 by Sherlynn Carbon, RN Outcome: Adequate for Discharge   Problem: Safety: Goal: Ability to remain free from injury will improve 05/16/2021 1408 by Sherlynn Carbon, RN Outcome: Adequate for Discharge 05/16/2021 1407 by Sherlynn Carbon, RN Outcome: Adequate for Discharge   Problem: Skin Integrity: Goal: Risk for impaired skin integrity will decrease 05/16/2021 1408 by Sherlynn Carbon, RN Outcome: Adequate for Discharge 05/16/2021 1407 by Sherlynn Carbon, RN Outcome: Adequate for Discharge

## 2021-05-16 NOTE — Progress Notes (Signed)
Inpatient Diabetes Program Recommendations  AACE/ADA: New Consensus Statement on Inpatient Glycemic Control (2015)  Target Ranges:  Prepandial:   less than 140 mg/dL      Peak postprandial:   less than 180 mg/dL (1-2 hours)      Critically ill patients:  140 - 180 mg/dL    Latest Reference Range & Units 05/15/21 08:04 05/15/21 11:14 05/15/21 16:02 05/15/21 21:24  Glucose-Capillary 70 - 99 mg/dL 153 (H)  3 units Novolog  10 units Semglee @0921   212 (H)  5 units Novolog  264 (H)  8 units Novolog  271 (H)  8 units Novolog     Home DM Meds: Lantus 10 units daily       Novolog 3-6 units TID  Current Orders: Semglee 10 units daily  Novolog 0-15 units TID ac/hs    Getting Prednisone 40 mg daily   MD- Note afternoon CBGs elevated likely due to Prednisone  Patient ate 100% meals yesterday and received Boost PO supps TID with meals  Please consider adding Novolog Meal Coverage:  Novolog 4 units TID with meals Hold if pt eats <50% of meal, Hold if pt NPO     --Will follow patient during hospitalization--  Wyn Quaker RN, MSN, CDE Diabetes Coordinator Inpatient Glycemic Control Team Team Pager: [phone] (8a-5p)

## 2021-05-16 NOTE — Discharge Summary (Signed)
Physician Discharge Summary   Patient name: Robert Chaney  Admit date:     05/01/2021  Discharge date: 05/16/2021  Discharge Physician: Debbe Odea   PCP: [name], MD   Recommendations at discharge: He will be going home with hospice  Discharge Diagnoses Principal Problem:   Acute on chronic diastolic CHF (congestive heart failure) ([place]) Active Problems:   Acute renal failure superimposed on stage 4 chronic kidney disease (HCC)   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[place])   OSA on CPAP   Type 2 diabetes mellitus with stage 4 chronic kidney disease, with long-term current use of insulin (HCC)   Chronic respiratory failure with hypoxia ([place])   Rectal adenocarcinoma ([place])   Mixed hyperlipidemia   Glaucoma   GERD without esophagitis   Resolved Diagnoses Resolved Problems:   Elevated troponin level not due myocardial infarction   Hypertensive crisis   Hospital Course   Mr. Knechtel is a 70  yo male with PMH rectal adenocarcinoma (dx CLN 03/2021), chronic hypoxic respiratory failure (4L at home), IDDM, HLD, HTN, CKD4, GERD, dCHF, HCM w/o LVOT obstruction, OSA on CPAP, glaucoma who presented to the hospital with worsening shortness of breath and nausea.  He was referred to the ER from the cancer center.  His dyspnea was worse with minimal exertion and improved with rest.  He denied any PND or orthopnea. On work-up he was found to be significantly hypertensive, 232/141 with mildly elevated troponin, 113 and elevated BNP. CXR showed pulmonary edema and patient was started on Lasix as well as IV labetalol and his medications were resumed. He was admitted for diuresis and further blood pressure control. Renal function did not tolerate diuresis.  He underwent evaluation with palliative care due to his decline and lack of improvement.  Patient elected for foregoing hemodialysis and instead focusing on pursuing more of a hospice/palliative route.    * Acute on chronic  diastolic CHF (congestive heart failure) ([place]) - patient presenting with symptoms of worsening shortness of breath and orthopnea - also noted to have pulmonary edema, B/L LE edema, DOE, pleural effusions -Cardiology also consulted on admission for assistance as well as in setting of indeterminate troponin -limited echo repeated on admission: EF 60-65%, Gr II DD - unfortunate circumstance with limited ability to diurese in setting of brittle CKD4; patient met with palliative care on 11/14 and decided to avoid HD and instead was interested on focusing on comfort driven care - he is being discharged home today with palliative care   Acute renal failure superimposed on stage 4 chronic kidney disease ([place]) - patient has history of CKD4. Baseline creat ~ 2.7 - 2.8, eGFR 21-24 - some worsening of renal function on 11/11; most likely was overdiuresis but also at risk for ATN - see palliative care discussion; patient declining HD  Rectal adenocarcinoma [facility]) - Recent diagnosis of rectal adenocarcinoma in 03/2021 - Patient has recently been established with Dr. [name] - Receiving radiation therapy in the outpatient setting -Cetacaine spray working well, patient requesting this at discharge.  Also continue as needed oxycodone - does not appear to be a candidate for chemo or surgery due to cardiopulm status; decision of patient to pursue HD would not change cancer management per oncology   Chronic respiratory failure with hypoxia ([place]) - on chronic 4L [place] O2 at home  Type 2 diabetes mellitus with stage 4 chronic kidney disease, with long-term current use of insulin (HCC) - Hemoglobin A1C performed in October was 5.9% - continue diabetic  diet  OSA on CPAP - CPAP QHS per home regimen  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[place]) - patient has carried this diagnosis previously; repeat echo this admission showing a "global longitudinal strain". This appears to raise some consideration that  underlying amyloid may be playing a factor    GERD without esophagitis - continue PPI  Glaucoma - Continue home eyedrops  Mixed hyperlipidemia - continue statin   Hypertensive crisis-resolved as of 05/04/2021 - Patient presenting with marked markedly elevated blood pressures as high as 232/141 - Presence of acute cardiogenic pulmonary edema and elevated troponin with chest pain are concerning for acute hypertensive emergency - s/p NTG and labetalol; restarted on home meds also - BP responded but still above goal (imdur increased to 90 mg daily on 11/10 per cardiology) - continue amlodipine, Coreg, doxazosin, hydralazine, Imdur  Elevated troponin level not due myocardial infarction-resolved as of 05/04/2021 - Patient presenting with elevated troponin of 113. Likely enzyme leak in setting of hypertensive crisis - denies CP but did have DOE and chest pressure with deep inspiration - no large uptrend with trending - no plans for ischemic evaluation especially given patient's decision to avoid further escalation      Condition at discharge: stable  Exam Physical Exam  General exam: Appears comfortable  HEENT: PERRLA, oral mucosa moist, no sclera icterus or thrush Respiratory system: Clear to auscultation. Respiratory effort normal. Cardiovascular system: S1 & S2 heard, regular rate and rhythm Gastrointestinal system: Abdomen soft, non-tender, nondistended. Normal bowel sounds   Central nervous system: Alert and oriented. No focal neurological deficits. Extremities: No cyanosis, clubbing or edema Skin: No rashes or ulcers Psychiatry:  Mood & affect appropriate.    Disposition: Home  Discharge time: greater than 30 minutes.  Follow-up Information     [facility] Follow up.   Specialty: Hospice and Palliative Medicine Why: Home hospice services-home visit 05/17/22 Contact information: [place] [address] [phone]                 Allergies as of 05/16/2021   No Known Allergies      Medication List     TAKE these medications    acetaminophen 325 MG tablet Commonly known as: TYLENOL Take 2 tablets (650 mg total) by mouth every 6 (six) hours as needed for mild pain (or Fever >/= 101).   amLODipine 10 MG tablet Commonly known as: NORVASC Take 1 tablet (10 mg total) by mouth daily.   atorvastatin 20 MG tablet Commonly known as: LIPITOR Take 1 tablet (20 mg total) by mouth daily.   brimonidine 0.2 % ophthalmic solution Commonly known as: ALPHAGAN Place 1 drop into the right eye 2 (two) times daily.   butamben-tetracaine-benzocaine 07-26-12 % spray Commonly known as: CETACAINE Apply 1 spray topically as needed (apply to rectum as needed).   carvedilol 25 MG tablet Commonly known as: COREG Take 1 tablet (25 mg total) by mouth 2 (two) times daily.   cholecalciferol 25 MCG (1000 UNIT) tablet Commonly known as: VITAMIN D3 Take 1,000 Units by mouth daily.   Dexcom G6 Sensor Misc 1 Device by Does not apply route as directed.   Dexcom G6 Transmitter Misc 1 Device by Does not apply route as directed.   docusate sodium 100 MG capsule Commonly known as: COLACE Take 2 capsules (200 mg total) by mouth 2 (two) times daily.   dorzolamide 2 % ophthalmic solution Commonly known as: TRUSOPT Place 1 drop into both eyes 2 (two) times daily.  doxazosin 2 MG tablet Commonly known as: CARDURA Take 1 tablet (2 mg total) by mouth at bedtime.   furosemide 40 MG tablet Commonly known as: LASIX Take 1 tablet (40 mg total) by mouth 2 (two) times daily as needed for fluid or edema. What changed:  when to take this reasons to take this   hydrALAZINE 100 MG tablet Commonly known as: APRESOLINE Take 1 tablet (100 mg total) by mouth 3 (three) times daily.   insulin aspart 100 UNIT/ML injection Commonly known as: novoLOG Inject 3-6 Units into the skin 3 (three) times daily before meals.   isosorbide  mononitrate 30 MG 24 hr tablet Commonly known as: IMDUR Take 3 tablets (90 mg total) by mouth daily. Start taking on: May 17, 2021 What changed:  medication strength how much to take   lactose free nutrition Liqd Take 237 mLs by mouth 3 (three) times daily with meals.   Lantus SoloStar 100 UNIT/ML Solostar Pen Generic drug: insulin glargine Inject 10 Units into the skin daily.   latanoprost 0.005 % ophthalmic solution Commonly known as: XALATAN Place 1 drop into the left eye at bedtime.   nitroGLYCERIN 0.4 MG SL tablet Commonly known as: NITROSTAT DISSOLVE ONE TABLET UNDER THE TONGUE EVERY 5 MINUTES AS NEEDED FOR CHEST PAIN. What changed: See the new instructions.   ondansetron 4 MG tablet Commonly known as: ZOFRAN Take 1 tablet (4 mg total) by mouth every 6 (six) hours as needed for nausea.   OneTouch Verio test strip Generic drug: glucose blood USE 1 STRIP TO CHECK GLUCOSE THREE TIMES DAILY AS DIRECTED   oxyCODONE 5 MG immediate release tablet Commonly known as: Oxy IR/ROXICODONE Take 1-2 tablets (5-10 mg total) by mouth every 6 (six) hours as needed for moderate pain or severe pain. What changed:  medication strength how much to take when to take this reasons to take this   pantoprazole 40 MG tablet Commonly known as: Protonix Take 1 tablet (40 mg total) by mouth 2 (two) times daily before a meal.   Pentips 32G X 4 MM Misc Generic drug: Insulin Pen Needle Use as directed with insulin   polyethylene glycol 17 g packet Commonly known as: MiraLax Take 17 g by mouth daily.   senna-docusate 8.6-50 MG tablet Commonly known as: Senokot-S Take 2 tablets by mouth 2 (two) times daily.               Durable Medical Equipment  (From admission, onward)           Start     Ordered   05/15/21 1451  For home use only DME Bedside commode  Once       Question:  Patient needs a bedside commode to treat with the following condition  Answer:  Ambulatory  dysfunction   05/15/21 1451            DG Chest 2 View  Result Date: 05/01/2021 CLINICAL DATA:  Chest pain EXAM: CHEST - 2 VIEW COMPARISON:  Chest radiograph dated 04/01/2021. FINDINGS: Cardiomegaly with vascular congestion and mild edema. Right upper lobe opacity consistent with pneumonia. Underlying mass is not excluded clinical correlation and follow-up to resolution recommended. Small bilateral pleural effusions suspected. No pneumothorax. Atherosclerotic calcification of the aorta. Degenerative changes of the spine. No acute osseous pathology. IMPRESSION: 1. Cardiomegaly with findings of CHF. 2. Right upper lobe pneumonia. Electronically Signed   By: Anner Crete M.D.   On: 05/01/2021 19:36   CT Head Wo Contrast  Result  Date: 05/01/2021 CLINICAL DATA:  Altered mental status EXAM: CT HEAD WITHOUT CONTRAST TECHNIQUE: Contiguous axial images were obtained from the base of the skull through the vertex without intravenous contrast. COMPARISON:  CT brain 01/30/2021 FINDINGS: Brain: No acute territorial infarction, hemorrhage or intracranial mass. 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 Mild atrophy. Moderate chronic small vessel ischemic changes of the white matter. Stable ventricle size. Vascular: No hyperdense vessels.  Carotid vascular calcification Skull: Normal. Negative for fracture or focal lesion. Sinuses/Orbits: Mucosal thickening in the sinuses Other: None IMPRESSION: 1. No CT evidence for acute intracranial abnormality. 2. Atrophy and chronic small vessel ischemic changes of the white matter. Multiple chronic cerebellar infarcts. Electronically Signed   By: Donavan Foil M.D.   On: 05/01/2021 19:38   CT CHEST WO CONTRAST  Result Date: 05/02/2021 CLINICAL DATA:  Abnormal radiograph. EXAM: CT CHEST WITHOUT CONTRAST TECHNIQUE: Multidetector CT imaging of the chest was performed following the standard protocol without IV contrast. COMPARISON:  Chest radiograph dated 05/01/2021.  FINDINGS: Evaluation of this exam is limited in the absence of intravenous contrast. Cardiovascular: There is cardiomegaly. No pericardial effusion. There is coronary vascular calcification. Moderate atherosclerotic calcification of the thoracic aorta. No aneurysmal dilatation. There is mild dilatation of the main pulmonary trunk suggestive of pulmonary hypertension. Mediastinum/Nodes: Evaluation of hilar lymph nodes is limited due to consolidative changes of the lungs and in the absence of intravenous contrast. The esophagus is grossly unremarkable. No mediastinal fluid collection. Lungs/Pleura: Moderate right and small left pleural effusions with associated partial compressive atelectasis of the lower lobes. There is diffuse interstitial and interlobular septal prominence consistent with edema. There is a focal area of airspace opacity in the right upper lobe which may represent edema but concerning for pneumonia. There is no pneumothorax. The central airways are patent. Upper Abdomen: Mild fullness of the left renal collecting system. Musculoskeletal: Degenerative changes of the spine. No acute osseous pathology. IMPRESSION: 1. Cardiomegaly with findings of CHF and bilateral pleural effusions. 2. Focal area of airspace opacity in the right upper lobe may represent edema but concerning for pneumonia. 3. Aortic Atherosclerosis (ICD10-I70.0). Electronically Signed   By: Anner Crete M.D.   On: 05/02/2021 00:23   ECHOCARDIOGRAM LIMITED  Result Date: 05/02/2021    ECHOCARDIOGRAM LIMITED REPORT   Patient Name:   ARNOL MCGIBBON Date of Exam: 05/02/2021 Medical Rec #:  [id number]     Height:       68.0 in Accession #:    [id number]    Weight:       175.0 lb Date of Birth:  [birth year]      BSA:          1.931 m Patient Age:    30 years      BP:           141/73 mmHg Patient Gender: M             HR:           62 bpm. Exam Location:  Inpatient Procedure: Limited Echo, Cardiac Doppler and Limited Color Doppler  Indications:    CHF, HCM  History:        Patient has prior history of Echocardiogram examinations, most                 recent 03/19/2021. CHF and Hypertrophic Cardiomyopathy,                 Signs/Symptoms:Elevated Troponin; Risk Factors:Diabetes,  Hypertension and HLD.  Sonographer:    Glo Herring Referring Phys: [id number] [place]  1. Left ventricular ejection fraction, by estimation, is 60 to 65%. The left ventricle has normal function. The left ventricle has no regional wall motion abnormalities. There is severe left ventricular hypertrophy. Left ventricular diastolic parameters  are consistent with Grade II diastolic dysfunction (pseudonormalization). The average left ventricular global longitudinal strain is -11.1 %. The global longitudinal strain is abnormal, there is relative preservation of strain at the apex (bullseye appearance). No LV outflow tract gradient.  2. Right ventricular systolic function is mildly reduced. The right ventricular size is normal. Mildly increased right ventricular wall thickness. Tricuspid regurgitation signal is inadequate for assessing PA pressure.  3. The mitral valve is normal in structure, no systolic anterior motion. Trivial mitral valve regurgitation. No evidence of mitral stenosis.  4. The aortic valve is tricuspid. Aortic valve regurgitation is trivial. No aortic stenosis is present.  5. The inferior vena cava is dilated in size with >50% respiratory variability, suggesting right atrial pressure of 8 mmHg.  6. The patient carries a diagnosis of hypertrophic cardiomyopathy. 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 FINDINGS  Left Ventricle: Left ventricular ejection fraction, by estimation, is 60 to 65%. The left ventricle has normal function. The left ventricle has no regional wall motion abnormalities. The average left ventricular global  longitudinal strain is -11.1 %. The global longitudinal strain is abnormal. The left ventricular internal cavity size was normal in size. There is severe left ventricular hypertrophy. Left ventricular diastolic parameters are consistent with Grade II diastolic dysfunction (pseudonormalization). Right Ventricle: The right ventricular size is normal. Mildly increased right ventricular wall thickness. Right ventricular systolic function is mildly reduced. Tricuspid regurgitation signal is inadequate for assessing PA pressure. Pericardium: There is no evidence of pericardial effusion. Mitral Valve: The mitral valve is normal in structure. Trivial mitral valve regurgitation. No evidence of mitral valve stenosis. Tricuspid Valve: The tricuspid valve is normal in structure. Tricuspid valve regurgitation is not demonstrated. Aortic Valve: The aortic valve is tricuspid. Aortic valve regurgitation is trivial. No aortic stenosis is present. Pulmonic Valve: The pulmonic valve was normal in structure. Pulmonic valve regurgitation is not visualized. Venous: The inferior vena cava is dilated in size with greater than 50% respiratory variability, suggesting right atrial pressure of 8 mmHg. LEFT VENTRICLE PLAX 2D LVIDd:         4.00 cm      Diastology LVIDs:         2.90 cm      LV e' medial:    5.22 cm/s LV PW:         1.80 cm      LV E/e' medial:  16.7 LV IVS:        1.80 cm      LV e' lateral:   5.55 cm/s                             LV E/e' lateral: 15.7  LV Volumes (MOD)            2D Longitudinal Strain LV vol d, MOD A2C: 102.0 ml 2D Strain GLS Avg:     -11.1 % LV vol d, MOD A4C: 132.0 ml LV vol s, MOD A2C: 64.2 ml LV vol s, MOD A4C: 62.0 ml LV SV MOD A2C:  37.8 ml LV SV MOD A4C:     132.0 ml LV SV MOD BP:      54.7 ml IVC IVC diam: 2.40 cm LEFT ATRIUM         Index LA diam:    4.50 cm 2.33 cm/m  MITRAL VALVE MV Area (PHT): 2.84 cm MV Decel Time: 267 msec MV E velocity: 87.40 cm/s MV A velocity: 74.10 cm/s MV E/A ratio:   1.18 Dalton McleanMD Electronically signed by Franki Monte Signature Date/Time: 05/02/2021/6:40:45 PM    Final    Results for orders placed or performed during the hospital encounter of 05/01/21  Blood culture (routine x 2)     Status: None   Collection Time: 05/01/21  6:00 PM   Specimen: BLOOD RIGHT ARM  Result Value Ref Range Status   Specimen Description   Final    BLOOD RIGHT ARM Performed at [facility] [address] [place], [place] [address]    Special Requests   Final    BOTTLES DRAWN AEROBIC AND ANAEROBIC Blood Culture adequate volume Performed at [facility] [address], [place], [place] [address]    Culture   Final    NO GROWTH 5 DAYS Performed at [facility], [place] [address], [place], [place] [address]    Report Status 05/06/2021 FINAL  Final  Resp Panel by RT-PCR (Flu A&B, Covid) Nasopharyngeal Swab     Status: None   Collection Time: 05/01/21  6:19 PM   Specimen: Nasopharyngeal Swab; Nasopharyngeal(NP) swabs in vial transport medium  Result Value Ref Range Status   SARS Coronavirus 2 by RT PCR NEGATIVE NEGATIVE Final    Comment: (NOTE) SARS-CoV-2 target nucleic acids are NOT DETECTED.  The SARS-CoV-2 RNA is generally detectable in upper respiratory specimens during the acute phase of infection. The lowest concentration of SARS-CoV-2 viral copies this assay can detect is 138 copies/mL. A negative result does not preclude SARS-Cov-2 infection and should not be used as the sole basis for treatment or other patient management decisions. A negative result may occur with  improper specimen collection/handling, submission of specimen other than nasopharyngeal swab, presence of viral mutation(s) within the areas targeted by this assay, and inadequate number of viral copies(<138 copies/mL). A negative result must be combined with clinical observations, patient history, and epidemiological information. The expected  result is Negative.  Fact Sheet for Patients:  EntrepreneurPulse.com.au  Fact Sheet for Healthcare Providers:  IncredibleEmployment.be  This test is no t yet approved or cleared by the [place] FDA and  has been authorized for detection and/or diagnosis of SARS-CoV-2 by FDA under an Emergency Use Authorization (EUA). This EUA will remain  in effect (meaning this test can be used) for the duration of the COVID-19 declaration under Section 564(b)(1) of the Act, 21 U.S.C.section 360bbb-3(b)(1), unless the authorization is terminated  or revoked sooner.       Influenza A by PCR NEGATIVE NEGATIVE Final   Influenza B by PCR NEGATIVE NEGATIVE Final    Comment: (NOTE) The Xpert Xpress SARS-CoV-2/FLU/RSV plus assay is intended as an aid in the diagnosis of influenza from Nasopharyngeal swab specimens and should not be used as a sole basis for treatment. Nasal washings and aspirates are unacceptable for Xpert Xpress SARS-CoV-2/FLU/RSV testing.  Fact Sheet for Patients: EntrepreneurPulse.com.au  Fact Sheet for Healthcare Providers: IncredibleEmployment.be  This test is not yet approved or cleared by the [place] FDA and has been authorized for detection and/or diagnosis of  SARS-CoV-2 by FDA under an Emergency Use Authorization (EUA). This EUA will remain in effect (meaning this test can be used) for the duration of the COVID-19 declaration under Section 564(b)(1) of the Act, 21 U.S.C. section 360bbb-3(b)(1), unless the authorization is terminated or revoked.  Performed at [facility], [place] [address] [place], [place] [address]   Blood culture (routine x 2)     Status: None   Collection Time: 05/01/21  6:30 PM   Specimen: BLOOD RIGHT ARM  Result Value Ref Range Status   Specimen Description   Final    BLOOD RIGHT ARM Performed at [facility] [address] [place], [place] [address]    Special Requests   Final    BOTTLES DRAWN AEROBIC AND ANAEROBIC Blood Culture adequate volume Performed at [facility] [address], [place], [place] [address]    Culture   Final    NO GROWTH 5 DAYS Performed at [facility], [place] [address] [place], [place] [address]    Report Status 05/06/2021 FINAL  Final    Signed:  Debbe Odea MD.  [facility] 05/16/2021, 1:30 PM

## 2021-05-16 NOTE — Progress Notes (Signed)
Pt. Refuses to wear CPAP machine.  States, "I will do fine with nasal cannula."  Nasal cannula 4L with humidity applied to pt.  Will monitor

## 2021-05-16 NOTE — Progress Notes (Signed)
   Daily Progress Note   Patient Name: Robert Chaney       Date: 05/16/2021 DOB: [birth year]  Age: 70 y.o. MRN#: [id number] Attending Physician: Debbe Odea, MD Primary Care Physician: [name], MD Admit Date: 05/01/2021  Reason for Consultation/Follow-up: Establishing goals of care, Non pain symptom management, Pain control, and Psychosocial/spiritual support  Subjective: Chart Reviewed. Updates Received. Patient Assessed.   Robert Chaney is sitting up in bed resting. Complains of generalized pain. Medications are effective when taken. He is appreciative to be able to discharge home later today and spend time with his brother.   I spoke with Robert Chaney via phone with patient. Reviewed goals of care and plan. They confirm wishes for ongoing support with outpatient hospice and brother has been in conversations with [facility].   Education provided on medication use in the home in addition to expectations regarding disease progression and trajectory, and symptom management.  All questions  Length of Stay: 15 days  Vital Signs: BP (!) 147/63 (BP Location: Right Arm)   Pulse (!) 59   Temp 98.8 F (37.1 C) (Oral)   Resp 18   Ht 5\' 8"  (1.727 m)   Wt 76.4 kg   SpO2 100%   BMI 25.61 kg/m  SpO2: SpO2: 100 % O2 Device: O2 Device: Nasal Cannula O2 Flow Rate: O2 Flow Rate (L/min): 4 L/min  Physical Exam: NAD, ill appearing  RRR Diminished bilaterally AAO x3, mood appropriate               Palliative Care Assessment & Plan   Code Status: DNR  Goals of Care/Recommendations: DNR/DNI Continue Oxycodone for pain [facility] outpatient for ongoing support PMT will continue to support and follow.    Prognosis: POOR  Discharge Planning: Home with Hospice  Thank you for allowing the Palliative Medicine Team to assist in the care of this patient.  Time Total: 25 min.   Visit consisted of counseling and education dealing with the complex and emotionally intense issues of  symptom management and palliative care in the setting of serious and potentially life-threatening illness.Greater than 50%  of this time was spent counseling and coordinating care related to the above assessment and plan.  Alda Lea, AGPCNP-BC  Palliative Medicine Team [phone]

## 2021-05-16 NOTE — TOC Transition Note (Signed)
Transition of Care [facility]) - CM/SW Discharge Note   Patient Details  Name: Robert Chaney MRN: [id number] Date of Birth: Feb 15, 1951  Transition of Care [facility]) CM/SW Contact:  Dessa Phi, RN Phone Number: 05/16/2021, 1:20 PM   Clinical Narrative: Spoke to patient/brother [name] about d/c plans-home w/hospice-[facility] rep Shanita already following-will make home visit tomorrow.DMe already set up by [facility] 3n1 delivered to home. [employer] rep Zelphia [place] will bring travel 02 tank to rm prior d/c. Nsg to manage w/c transport-address confirmed. DNR in packet @ Nsg station. No further CM needs.      Final next level of care: [place] Barriers to Discharge: No Barriers Identified   Patient Goals and CMS Choice Patient states their goals for this hospitalization and ongoing recovery are:: go home CMS Medicare.gov Compare Post Acute Care list provided to:: Patient Choice offered to / list presented to : Adult Children  Discharge Placement                       Discharge Plan and Services   Discharge Planning Services: CM Consult                      HH Arranged: RN, PT [facility] Agency: Hospice and [place]     Representative spoke with at [place]: Shanita-[facility]  Social Determinants of Health (SDOH) Interventions     Readmission Risk Interventions Readmission Risk Prevention Plan 05/04/2021 04/11/2021 03/22/2021  Transportation Screening Complete Complete Complete  PCP or Specialist Appt within 3-5 Days - - -  HRI or [place] for [place] - - -  Medication Review Press photographer) Complete Complete Complete  PCP or Specialist appointment within 3-5 days of discharge Complete Complete Complete  HRI or Home Care Consult Complete Complete Complete  SW Recovery Care/Counseling Consult Complete Complete Complete  Palliative  Care Screening Not Applicable Not Applicable Not [place] Not Applicable Not Applicable Not Applicable  Some recent data might be hidden

## 2021-05-16 NOTE — Telephone Encounter (Signed)
Mr. Espindola brother, [name], called asking about what time Mr. Leece was being transported from the hospital. I reached out to his nurse and she said 5pm. I relayed this information to [place] and he said he would let Mr. Matthes know. All questions answered. Understanding verbalized.

## 2021-05-16 NOTE — Progress Notes (Signed)
   05/16/21 1200  Mobility  Activity Refused mobility   Pt refused mobility today. Will check back on Friday.   [place] Specialist Acute Rehab Services Office: [phone]

## 2021-05-24 ENCOUNTER — Telehealth: Payer: Self-pay

## 2021-05-24 NOTE — Telephone Encounter (Signed)
[name], Mr. Leinbach brother, called and stated that he needed to go home for a few days in December. He asked who would be able to take care of Mr. Risko during this time. I advised him to reach out to [place] for their respite program. I told him to call me back if this didn't work out so we could further assist. Understanding verbalized. All questions answered.

## 2021-05-30 ENCOUNTER — Ambulatory Visit: Payer: PPO | Admitting: Radiation Oncology

## 2021-06-05 ENCOUNTER — Encounter: Payer: Self-pay | Admitting: *Deleted

## 2021-06-07 ENCOUNTER — Ambulatory Visit: Payer: PPO | Admitting: Internal Medicine

## 2021-06-10 ENCOUNTER — Encounter (HOSPITAL_COMMUNITY): Payer: Self-pay | Admitting: Oncology

## 2021-06-10 ENCOUNTER — Other Ambulatory Visit: Payer: Self-pay

## 2021-06-10 ENCOUNTER — Emergency Department (HOSPITAL_COMMUNITY)
Admission: EM | Admit: 2021-06-10 | Discharge: 2021-06-12 | Disposition: A | Discharge status: Home / Self Care | Payer: PPO | Attending: Emergency Medicine | Admitting: Emergency Medicine

## 2021-06-10 ENCOUNTER — Emergency Department (HOSPITAL_COMMUNITY): Payer: PPO

## 2021-06-10 DIAGNOSIS — Z20822 Contact with and (suspected) exposure to covid-19: Secondary | ICD-10-CM | POA: Insufficient documentation

## 2021-06-10 DIAGNOSIS — R600 Localized edema: Secondary | ICD-10-CM | POA: Insufficient documentation

## 2021-06-10 DIAGNOSIS — E1142 Type 2 diabetes mellitus with diabetic polyneuropathy: Secondary | ICD-10-CM | POA: Diagnosis not present

## 2021-06-10 DIAGNOSIS — I5032 Chronic diastolic (congestive) heart failure: Secondary | ICD-10-CM | POA: Insufficient documentation

## 2021-06-10 DIAGNOSIS — N184 Chronic kidney disease, stage 4 (severe): Secondary | ICD-10-CM | POA: Diagnosis not present

## 2021-06-10 DIAGNOSIS — Z79899 Other long term (current) drug therapy: Secondary | ICD-10-CM | POA: Insufficient documentation

## 2021-06-10 DIAGNOSIS — Z85048 Personal history of other malignant neoplasm of rectum, rectosigmoid junction, and anus: Secondary | ICD-10-CM | POA: Insufficient documentation

## 2021-06-10 DIAGNOSIS — Z794 Long term (current) use of insulin: Secondary | ICD-10-CM | POA: Insufficient documentation

## 2021-06-10 DIAGNOSIS — R0902 Hypoxemia: Secondary | ICD-10-CM | POA: Diagnosis not present

## 2021-06-10 DIAGNOSIS — I130 Hypertensive heart and chronic kidney disease with heart failure and stage 1 through stage 4 chronic kidney disease, or unspecified chronic kidney disease: Secondary | ICD-10-CM | POA: Insufficient documentation

## 2021-06-10 DIAGNOSIS — I700 Atherosclerosis of aorta: Secondary | ICD-10-CM | POA: Diagnosis not present

## 2021-06-10 DIAGNOSIS — I10 Essential (primary) hypertension: Secondary | ICD-10-CM | POA: Diagnosis not present

## 2021-06-10 DIAGNOSIS — I517 Cardiomegaly: Secondary | ICD-10-CM | POA: Diagnosis not present

## 2021-06-10 DIAGNOSIS — R0602 Shortness of breath: Secondary | ICD-10-CM | POA: Insufficient documentation

## 2021-06-10 LAB — CBC WITH DIFFERENTIAL/PLATELET
Abs Immature Granulocytes: 0.02 10*3/uL (ref 0.00–0.07)
Basophils Absolute: 0 10*3/uL (ref 0.0–0.1)
Basophils Relative: 0 %
Eosinophils Absolute: 0 10*3/uL (ref 0.0–0.5)
Eosinophils Relative: 0 %
HCT: 31.9 % — ABNORMAL LOW (ref 39.0–52.0)
Hemoglobin: 9.7 g/dL — ABNORMAL LOW (ref 13.0–17.0)
Immature Granulocytes: 0 %
Lymphocytes Relative: 20 %
Lymphs Abs: 1 10*3/uL (ref 0.7–4.0)
MCH: 30 pg (ref 26.0–34.0)
MCHC: 30.4 g/dL (ref 30.0–36.0)
MCV: 98.8 fL (ref 80.0–100.0)
Monocytes Absolute: 0.3 10*3/uL (ref 0.1–1.0)
Monocytes Relative: 6 %
Neutro Abs: 3.6 10*3/uL (ref 1.7–7.7)
Neutrophils Relative %: 74 %
Platelets: 308 10*3/uL (ref 150–400)
RBC: 3.23 MIL/uL — ABNORMAL LOW (ref 4.22–5.81)
RDW: 15.9 % — ABNORMAL HIGH (ref 11.5–15.5)
WBC: 4.9 10*3/uL (ref 4.0–10.5)
nRBC: 0 % (ref 0.0–0.2)

## 2021-06-10 LAB — COMPREHENSIVE METABOLIC PANEL
ALT: 12 U/L (ref 0–44)
AST: 12 U/L — ABNORMAL LOW (ref 15–41)
Albumin: 2.8 g/dL — ABNORMAL LOW (ref 3.5–5.0)
Alkaline Phosphatase: 91 U/L (ref 38–126)
Anion gap: 5 (ref 5–15)
BUN: 45 mg/dL — ABNORMAL HIGH (ref 8–23)
CO2: 25 mmol/L (ref 22–32)
Calcium: 8.8 mg/dL — ABNORMAL LOW (ref 8.9–10.3)
Chloride: 109 mmol/L (ref 98–111)
Creatinine, Ser: 2.41 mg/dL — ABNORMAL HIGH (ref 0.61–1.24)
GFR, Estimated: 28 mL/min — ABNORMAL LOW (ref >60)
Glucose, Bld: 165 mg/dL — ABNORMAL HIGH (ref 70–99)
Potassium: 4.8 mmol/L (ref 3.5–5.1)
Sodium: 139 mmol/L (ref 135–145)
Total Bilirubin: 0.7 mg/dL (ref 0.3–1.2)
Total Protein: 7.1 g/dL (ref 6.5–8.1)

## 2021-06-10 LAB — BRAIN NATRIURETIC PEPTIDE: B Natriuretic Peptide: 2427.4 pg/mL — ABNORMAL HIGH (ref 0.0–100.0)

## 2021-06-10 MED ORDER — INSULIN GLARGINE-YFGN 100 UNIT/ML ~~LOC~~ SOLN
10.0000 [IU] | Freq: Every day | SUBCUTANEOUS | Status: DC
  Administered 2021-06-11: 10:00:00 10 [IU] via SUBCUTANEOUS
  Filled 2021-06-10: qty 0.1

## 2021-06-10 MED ORDER — TRAZODONE HCL 100 MG PO TABS
50.0000 mg | ORAL_TABLET | Freq: Every day | ORAL | Status: DC
  Administered 2021-06-11: 22:00:00 50 mg via ORAL
  Filled 2021-06-10: qty 1

## 2021-06-10 MED ORDER — INSULIN ASPART 100 UNIT/ML IJ SOLN
3.0000 [IU] | Freq: Three times a day (TID) | INTRAMUSCULAR | Status: DC
Start: 2021-06-11 — End: 2021-06-12
  Administered 2021-06-11: 13:00:00 6 [IU] via SUBCUTANEOUS
  Filled 2021-06-10: qty 0.06

## 2021-06-10 MED ORDER — DORZOLAMIDE HCL 2 % OP SOLN
1.0000 [drp] | Freq: Two times a day (BID) | OPHTHALMIC | Status: DC
  Administered 2021-06-10 – 2021-06-11 (×2): 1 [drp] via OPHTHALMIC
  Filled 2021-06-10: qty 10

## 2021-06-10 MED ORDER — ONDANSETRON HCL 4 MG PO TABS: 4.0000 mg | ORAL_TABLET | Freq: Four times a day (QID) | ORAL | Status: DC | PRN

## 2021-06-10 MED ORDER — FUROSEMIDE 10 MG/ML IJ SOLN
80.0000 mg | Freq: Once | INTRAMUSCULAR | Status: AC
  Administered 2021-06-10: 14:00:00 80 mg via INTRAVENOUS
  Filled 2021-06-10: qty 8

## 2021-06-10 MED ORDER — CARVEDILOL 12.5 MG PO TABS
12.5000 mg | ORAL_TABLET | Freq: Two times a day (BID) | ORAL | Status: DC
  Administered 2021-06-11 (×2): 12.5 mg via ORAL
  Filled 2021-06-10 (×2): qty 1

## 2021-06-10 MED ORDER — SENNOSIDES-DOCUSATE SODIUM 8.6-50 MG PO TABS
2.0000 | ORAL_TABLET | Freq: Two times a day (BID) | ORAL | Status: DC
  Administered 2021-06-11 (×2): 2 via ORAL
  Filled 2021-06-10 (×2): qty 2

## 2021-06-10 MED ORDER — DOCUSATE SODIUM 100 MG PO CAPS
200.0000 mg | ORAL_CAPSULE | Freq: Two times a day (BID) | ORAL | Status: DC
  Administered 2021-06-11 (×2): 200 mg via ORAL
  Filled 2021-06-10 (×2): qty 2

## 2021-06-10 MED ORDER — HYDRALAZINE HCL 25 MG PO TABS
100.0000 mg | ORAL_TABLET | Freq: Once | ORAL | Status: AC
  Administered 2021-06-10: 13:00:00 100 mg via ORAL
  Filled 2021-06-10: qty 4

## 2021-06-10 MED ORDER — ISOSORBIDE MONONITRATE ER 60 MG PO TB24
90.0000 mg | ORAL_TABLET | Freq: Every day | ORAL | Status: DC
  Administered 2021-06-11: 10:00:00 90 mg via ORAL
  Filled 2021-06-10: qty 1

## 2021-06-10 MED ORDER — HYDRALAZINE HCL 25 MG PO TABS
100.0000 mg | ORAL_TABLET | Freq: Once | ORAL | Status: AC
  Administered 2021-06-10: 20:00:00 100 mg via ORAL
  Filled 2021-06-10: qty 4

## 2021-06-10 MED ORDER — LATANOPROST 0.005 % OP SOLN
1.0000 [drp] | Freq: Every day | OPHTHALMIC | Status: DC
  Administered 2021-06-10 – 2021-06-11 (×2): 1 [drp] via OPHTHALMIC
  Filled 2021-06-10: qty 2.5

## 2021-06-10 MED ORDER — ATORVASTATIN CALCIUM 10 MG PO TABS
20.0000 mg | ORAL_TABLET | Freq: Every day | ORAL | Status: DC
  Administered 2021-06-11: 10:00:00 20 mg via ORAL
  Filled 2021-06-10: qty 2

## 2021-06-10 MED ORDER — BRIMONIDINE TARTRATE 0.2 % OP SOLN
1.0000 [drp] | Freq: Two times a day (BID) | OPHTHALMIC | Status: DC
  Administered 2021-06-10 – 2021-06-11 (×2): 1 [drp] via OPHTHALMIC
  Filled 2021-06-10: qty 5

## 2021-06-10 MED ORDER — OXYCODONE HCL 5 MG PO TABS
5.0000 mg | ORAL_TABLET | Freq: Four times a day (QID) | ORAL | Status: DC | PRN
Start: 2021-06-10 — End: 2021-06-12
  Administered 2021-06-11: 13:00:00 10 mg via ORAL
  Filled 2021-06-10: qty 2
  Filled 2021-06-10: qty 1

## 2021-06-10 MED ORDER — POLYETHYLENE GLYCOL 3350 17 G PO PACK
17.0000 g | PACK | Freq: Every day | ORAL | Status: DC
  Administered 2021-06-11: 10:00:00 17 g via ORAL
  Filled 2021-06-10: qty 1

## 2021-06-10 MED ORDER — ACETAMINOPHEN 325 MG PO TABS: 650.0000 mg | ORAL_TABLET | Freq: Four times a day (QID) | ORAL | Status: DC | PRN

## 2021-06-10 MED ORDER — DOXAZOSIN MESYLATE 2 MG PO TABS
2.0000 mg | ORAL_TABLET | Freq: Every day | ORAL | Status: DC
  Administered 2021-06-11 (×2): 2 mg via ORAL
  Filled 2021-06-10 (×2): qty 1

## 2021-06-10 MED ORDER — METHADONE HCL 5 MG PO TABS
2.5000 mg | ORAL_TABLET | Freq: Three times a day (TID) | ORAL | Status: DC
  Administered 2021-06-11 (×3): 2.5 mg via ORAL
  Filled 2021-06-10 (×3): qty 1

## 2021-06-10 MED ORDER — AMLODIPINE BESYLATE 5 MG PO TABS
10.0000 mg | ORAL_TABLET | Freq: Every day | ORAL | Status: DC
  Administered 2021-06-11: 10:00:00 10 mg via ORAL
  Filled 2021-06-10: qty 2

## 2021-06-10 MED ORDER — HYDRALAZINE HCL 50 MG PO TABS
100.0000 mg | ORAL_TABLET | Freq: Three times a day (TID) | ORAL | Status: DC
  Administered 2021-06-11 (×3): 100 mg via ORAL
  Filled 2021-06-10 (×4): qty 2

## 2021-06-10 MED ORDER — FUROSEMIDE 40 MG PO TABS: 40.0000 mg | ORAL_TABLET | Freq: Two times a day (BID) | ORAL | Status: DC | PRN

## 2021-06-10 MED ORDER — HYDRALAZINE HCL 20 MG/ML IJ SOLN
10.0000 mg | Freq: Once | INTRAMUSCULAR | Status: AC
  Administered 2021-06-10: 21:00:00 10 mg via INTRAVENOUS
  Filled 2021-06-10: qty 1

## 2021-06-10 NOTE — ED Provider Notes (Addendum)
4:49 PM Care of the patient assumed at signout.  Now on repeat exam he is awake, alert, calm, blood pressure is improved substantially, he has diuresed.  I offered admission versus discharge, the patient reiterates that he would like to go home.  He is enrolled in hospice care, states that he has caregivers.  Patient discharged.   Carmin Muskrat, MD 06/10/21 1650  10:37 PM Where the patient had previously said that he had multiple people able to care for him, he no longer seems to have these people available.  Per hospice patient can be seen, evaluated tomorrow for assistance with placement.    Carmin Muskrat, MD 06/10/21 2238

## 2021-06-10 NOTE — Discharge Instructions (Addendum)
Please be sure to follow-up with your physician, take medication as directed, and do not hesitate to return here if you develop new, or concerning changes in your condition.

## 2021-06-10 NOTE — ED Provider Notes (Signed)
[facility] DEPT Provider Note   CSN: [id number] Arrival date & time: 06/10/21  1106     History Chief Complaint  Patient presents with   Shortness of Breath    Robert Chaney is a 70 y.o. male.  Patient is a 71 year old male with a history of rectal adenocarcinoma that is not amenable to treatment due to his other compounding diseases.  He also has CHF, chronic kidney disease, hokum, diabetes, respiratory failure on oxygen at 4 L/min.  He had a recent admission for fluid overload and was started on diuresis which worsened his kidney function.  He is choosing not to do dialysis.  He is currently on hospice care.  He is living by himself but he has a brother who stays with him frequently.  He is on oxygen at 4 L/min.  He said that his oxygen tubing got distracted and he has not been able to get his oxygen for the last 2 days.  For this reason he was short of breath.  He was placed back on oxygen by EMS and has been feeling better since that time.  He feels pretty much at his baseline currently.      Past Medical History:  Diagnosis Date   Altered mental status    a. 05/2017 - adm with blurred vision, somnolence in setting of AKI and high blood sugar.   Anemia    Chronic respiratory failure (HCC)    CKD (chronic kidney disease), stage III (HCC)    Diabetes mellitus    Diastolic CHF, chronic ([place])    A.  03/2009 Echo: EF 60-65%, Gr II diast dysfxn   Diverticulosis    Elevated troponin    a. 2013 - troponin 1.4. b. 2019 - troponin 0.32; neg nuc 07/2017.   Glaucoma    H. pylori infection    Hyperlipidemia    HYPERCHOLESTEROLEMIA   Hypertension    MARKED LEFT VENTRICULAR HYPERTROPHY BY PREVIOUS ECHOCARDIOGRAM--HE HAS HYPERDYNAMIC LEFT VENTRICULAR SYSTOLIC FUNCTION AND HAS IMPAIRED RELAXATION BY ECHO   Hypertensive crisis    Hypertrophic cardiomyopathy (HCC)    NSVT (nonsustained ventricular tachycardia)    OSA (obstructive sleep apnea)    Premature  atrial contractions    PVC's (premature ventricular contractions)    Rectal adenocarcinoma (HCC)    SVT (supraventricular tachycardia) ([place])     Patient Active Problem List   Diagnosis Date Noted   Mixed hyperlipidemia 05/01/2021   Glaucoma 05/01/2021   GERD without esophagitis 05/01/2021   Rectal adenocarcinoma (HCC)    Rectal bleeding    Malnutrition of moderate degree 04/03/2021   Chronic constipation    Hematochezia    Chronic posterior anal fissure    Helicobacter pylori gastritis    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[place]) 03/16/2021   Abnormal CXR 03/16/2021   Elevated brain natriuretic peptide (BNP) level 03/16/2021   Left thyroid nodule 03/16/2021   GI bleed 03/16/2021   Acute blood loss anemia 03/16/2021   Acute on chronic respiratory failure with hypoxia and hypercapnia ([place]) 02/28/2021   Melena 02/28/2021   Symptomatic anemia 02/28/2021   Syncope 01/30/2021   Chronic respiratory failure with hypoxia ([place]) 01/30/2021   Multifocal pneumonia 01/30/2021   Acute on chronic respiratory failure with hypoxia ([place]) 05/20/2020   Type 2 diabetes mellitus with retinopathy, with long-term current use of insulin ([place]) 07/16/2019   Type 2 diabetes mellitus with stage 4 chronic kidney disease, with long-term current use of insulin ([place]) 07/16/2019   Type 2 diabetes mellitus with  diabetic polyneuropathy, with long-term current use of insulin ([place]) 07/16/2019   OSA on CPAP 03/30/2019   Hyperglycemia 07/31/2017   Near syncope 07/31/2017   Acute-on-chronic kidney injury ([place]) 07/31/2017   AKI (acute kidney injury) ([place]) 06/09/2017   Hypertensive heart disease 11/07/2016   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[place]) 07/26/2015   Acute renal failure superimposed on stage 4 chronic kidney disease ([place]) 07/19/2011   Hyperkalemia 07/19/2011   Volume depletion 07/19/2011   Acute on chronic diastolic CHF (congestive heart failure) ([place]) 07/19/2011   Essential hypertension 07/19/2011    Hypercholesterolemia 02/20/2011   Benign hypertensive heart disease without heart failure 02/20/2011    Past Surgical History:  Procedure Laterality Date   BIOPSY  03/03/2021   Procedure: BIOPSY;  Surgeon: Jackquline Denmark, MD;  Location: [place];  Service: Endoscopy;;   BIOPSY  04/06/2021   Procedure: BIOPSY;  Surgeon: [name], MD;  Location: [place];  Service: Gastroenterology;;   BRONCHIAL BIOPSY  03/05/2021   Procedure: BRONCHIAL BIOPSIES;  Surgeon: Candee Furbish, MD;  Location[facility] ENDOSCOPY;  Service: Pulmonary;;   BRONCHIAL BRUSHINGS  03/05/2021   Procedure: BRONCHIAL BRUSHINGS;  Surgeon: Candee Furbish, MD;  Location: [place];  Service: Pulmonary;;   BRONCHIAL WASHINGS  03/05/2021   Procedure: BRONCHIAL WASHINGS;  Surgeon: Candee Furbish, MD;  Location: [facility] ENDOSCOPY;  Service: Pulmonary;;   COLONOSCOPY WITH PROPOFOL N/A 04/06/2021   Procedure: COLONOSCOPY WITH PROPOFOL;  Surgeon: [name], MD;  Location: [place];  Service: Gastroenterology;  Laterality: N/A;   ESOPHAGOGASTRODUODENOSCOPY (EGD) WITH PROPOFOL N/A 03/03/2021   Procedure: ESOPHAGOGASTRODUODENOSCOPY (EGD) WITH PROPOFOL;  Surgeon: Jackquline Denmark, MD;  Location: [facility] ENDOSCOPY;  Service: Endoscopy;  Laterality: N/A;   HEMOSTASIS CONTROL  03/05/2021   Procedure: HEMOSTASIS CONTROL;  Surgeon: Candee Furbish, MD;  Location: [facility] ENDOSCOPY;  Service: Pulmonary;;   NO PAST SURGERIES     POLYPECTOMY  04/06/2021   Procedure: POLYPECTOMY;  Surgeon: [name], MD;  Location: [place];  Service: Gastroenterology;;   VIDEO BRONCHOSCOPY N/A 03/05/2021   Procedure: VIDEO BRONCHOSCOPY WITH FLUORO;  Surgeon: Candee Furbish, MD;  Location: [facility] ENDOSCOPY;  Service: Pulmonary;  Laterality: N/A;       Family History  Problem Relation Age of Onset   Hypertension Father    Stroke Father    Hypertension Mother    Diabetes Brother    Hypertension Brother    Diabetes Brother     Social History    Tobacco Use   Smoking status: Never   Smokeless tobacco: Never  Vaping Use   Vaping Use: Never used  Substance Use Topics   Alcohol use: No   Drug use: No    Home Medications Prior to Admission medications   Medication Sig Start Date End Date Taking? Authorizing Provider  acetaminophen (TYLENOL) 325 MG tablet Take 2 tablets (650 mg total) by mouth every 6 (six) hours as needed for mild pain (or Fever >/= 101). 05/16/21   Debbe Odea, MD  amLODipine (NORVASC) 10 MG tablet Take 1 tablet (10 mg total) by mouth daily. 05/13/20   Shawna Clamp, MD  atorvastatin (LIPITOR) 20 MG tablet Take 1 tablet (20 mg total) by mouth daily. 03/16/18   Dorothy Spark, MD  brimonidine (ALPHAGAN) 0.2 % ophthalmic solution Place 1 drop into the right eye 2 (two) times daily. 10/06/19   [provider]  butamben-tetracaine-benzocaine (CETACAINE) 07-26-12 % spray Apply 1 spray topically as needed (apply to rectum as needed). 05/16/21  Debbe Odea, MD  carvedilol (COREG) 25 MG tablet Take 1 tablet (25 mg total) by mouth 2 (two) times daily. 04/14/21   Little Ishikawa, MD  cholecalciferol (VITAMIN D3) 25 MCG (1000 UT) tablet Take 1,000 Units by mouth daily.    [provider]  Continuous Blood Gluc Sensor (DEXCOM G6 SENSOR) MISC 1 Device by Does not apply route as directed. 01/12/21   Shamleffer, Wannetta Langland Crazier, MD  Continuous Blood Gluc Transmit (DEXCOM G6 TRANSMITTER) MISC 1 Device by Does not apply route as directed. 01/12/21   Shamleffer, Trypp Heckmann Crazier, MD  docusate sodium (COLACE) 100 MG capsule Take 2 capsules (200 mg total) by mouth 2 (two) times daily. 03/10/21   Thurnell Lose, MD  dorzolamide (TRUSOPT) 2 % ophthalmic solution Place 1 drop into both eyes 2 (two) times daily. 12/04/20   [provider]  doxazosin (CARDURA) 2 MG tablet Take 1 tablet (2 mg total) by mouth at bedtime. 05/16/21   Debbe Odea, MD  furosemide (LASIX) 40 MG tablet Take 1 tablet (40 mg  total) by mouth 2 (two) times daily as needed for fluid or edema. 05/16/21   Debbe Odea, MD  glucose blood (ONETOUCH VERIO) test strip USE 1 STRIP TO CHECK GLUCOSE THREE TIMES DAILY AS DIRECTED 07/24/20   Shamleffer, Issiah Huffaker Crazier, MD  hydrALAZINE (APRESOLINE) 100 MG tablet Take 1 tablet (100 mg total) by mouth 3 (three) times daily. 03/16/20   Skeet Latch, MD  insulin aspart (NOVOLOG) 100 UNIT/ML injection Inject 3-6 Units into the skin 3 (three) times daily before meals.    [provider]  insulin glargine (LANTUS SOLOSTAR) 100 UNIT/ML Solostar Pen Inject 10 Units into the skin daily. 03/22/21   Mercy Riding, MD  Insulin Pen Needle 32G X 4 MM MISC Use as directed with insulin 03/22/21   Mercy Riding, MD  isosorbide mononitrate (IMDUR) 30 MG 24 hr tablet Take 3 tablets (90 mg total) by mouth daily. 05/17/21   Debbe Odea, MD  lactose free nutrition (BOOST PLUS) LIQD Take 237 mLs by mouth 3 (three) times daily with meals. 05/16/21   Debbe Odea, MD  latanoprost (XALATAN) 0.005 % ophthalmic solution Place 1 drop into the left eye at bedtime. 10/06/19   [provider]  nitroGLYCERIN (NITROSTAT) 0.4 MG SL tablet DISSOLVE ONE TABLET UNDER THE TONGUE EVERY 5 MINUTES AS NEEDED FOR CHEST PAIN. Patient taking differently: Place 0.4 mg under the tongue every 5 (five) minutes as needed for chest pain. 03/27/20   Dorothy Spark, MD  ondansetron (ZOFRAN) 4 MG tablet Take 1 tablet (4 mg total) by mouth every 6 (six) hours as needed for nausea. 05/16/21   Debbe Odea, MD  oxyCODONE (OXY IR/ROXICODONE) 5 MG immediate release tablet Take 1-2 tablets (5-10 mg total) by mouth every 6 (six) hours as needed for moderate pain or severe pain. 05/16/21   Debbe Odea, MD  pantoprazole (PROTONIX) 40 MG tablet Take 1 tablet (40 mg total) by mouth 2 (two) times daily before a meal. 03/10/21   Thurnell Lose, MD  polyethylene glycol (MIRALAX) 17 g packet Take 17 g by mouth daily. 03/10/21    Thurnell Lose, MD  senna-docusate (SENOKOT-S) 8.6-50 MG tablet Take 2 tablets by mouth 2 (two) times daily. 05/16/21   Debbe Odea, MD    Allergies    Patient has no known allergies.  Review of Systems   Review of Systems  Constitutional:  Positive for fatigue. Negative for chills, diaphoresis and  fever.  HENT:  Negative for congestion, rhinorrhea and sneezing.   Eyes: Negative.   Respiratory:  Positive for shortness of breath. Negative for cough and chest tightness.   Cardiovascular:  Negative for chest pain and leg swelling.  Gastrointestinal:  Negative for abdominal pain, blood in stool, diarrhea, nausea and vomiting.  Genitourinary:  Negative for difficulty urinating, flank pain, frequency and hematuria.  Musculoskeletal:  Negative for arthralgias and back pain.  Skin:  Negative for rash.  Neurological:  Negative for dizziness, speech difficulty, weakness, numbness and headaches.   Physical Exam Updated Vital Signs BP (!) 232/120    Pulse 75    Temp 97.9 F (36.6 C) (Oral)    Resp 17    SpO2 96%   Physical Exam Constitutional:      Appearance: He is well-developed.  HENT:     Head: Normocephalic and atraumatic.  Eyes:     Pupils: Pupils are equal, round, and reactive to light.  Cardiovascular:     Rate and Rhythm: Normal rate and regular rhythm.     Heart sounds: Normal heart sounds.  Pulmonary:     Effort: Pulmonary effort is normal. No respiratory distress.     Breath sounds: Rhonchi present. No wheezing or rales.  Chest:     Chest wall: No tenderness.  Abdominal:     General: Bowel sounds are normal.     Palpations: Abdomen is soft.     Tenderness: There is no abdominal tenderness. There is no guarding or rebound.  Musculoskeletal:        General: Normal range of motion.     Cervical back: Normal range of motion and neck supple.     Comments: Small amount of pitting edema bilaterally  Lymphadenopathy:     Cervical: No cervical adenopathy.  Skin:     General: Skin is warm and dry.     Findings: No rash.  Neurological:     Mental Status: He is alert and oriented to person, place, and time.    ED Results / Procedures / Treatments   Labs (all labs ordered are listed, but only abnormal results are displayed) Labs Reviewed  BRAIN NATRIURETIC PEPTIDE - Abnormal; Notable for the following components:      Result Value   B Natriuretic Peptide 2,427.4 (*)    All other components within normal limits  CBC WITH DIFFERENTIAL/PLATELET - Abnormal; Notable for the following components:   RBC 3.23 (*)    Hemoglobin 9.7 (*)    HCT 31.9 (*)    RDW 15.9 (*)    All other components within normal limits  COMPREHENSIVE METABOLIC PANEL - Abnormal; Notable for the following components:   Glucose, Bld 165 (*)    BUN 45 (*)    Creatinine, Ser 2.41 (*)    Calcium 8.8 (*)    Albumin 2.8 (*)    AST 12 (*)    GFR, Estimated 28 (*)    All other components within normal limits    EKG None  Radiology DG Chest 2 View  Result Date: 06/10/2021 CLINICAL DATA:  Shortness of breath.  History of lung cancer. EXAM: CHEST - 2 VIEW COMPARISON:  Chest radiograph dated May 01, 2021. CT examination dated May 02, 2021. FINDINGS: The heart is enlarged. Prominence of the central pulmonary vessels. Atherosclerotic calcification of the aortic arch. Right mid lung opacity which may represent atelectasis or infiltrate. No pleural effusion or pneumothorax. IMPRESSION: 1.  Stable cardiomegaly. 2. Right mid lung opacity, which may  represent atelectasis secondary to prior pneumonia. Recurrent/resolving pneumonia can not be excluded. Electronically Signed   By: Keane Police D.O.   On: 06/10/2021 12:39    Procedures Procedures   Medications Ordered in ED Medications  furosemide (LASIX) injection 80 mg (has no administration in time range)  hydrALAZINE (APRESOLINE) tablet 100 mg (100 mg Oral Given 06/10/21 1248)    ED Course  I have reviewed the triage vital signs  and the nursing notes.  Pertinent labs & imaging results that were available during my care of the patient were reviewed by me and considered in my medical decision making (see chart for details).    MDM Rules/Calculators/A&P                         Pt is a 70 year old male who has a history of rectal adenocarcinoma and kidney failure.  He has opted for hospice care.  He comes in today for shortness of breath which seems to be related to his oxygen tubing failure.  He says he feels like he is at baseline currently since he has been back on oxygen.  He does not have any worsening cough, fevers or chest pain.  No increased swelling.  His labs show elevated creatinine which is similar to prior values.  His BNP is elevated.  Chest x-ray shows some chronic changes and also possible resolving or early infiltrate although he does not have any current symptoms that would be consistent with new pneumonia.  He was given dose of IV Lasix and a dose of blood pressure medication for his elevated blood pressures.  Will monitor and likely discharge.  Patient does not feel that he needs to be admitted.  This sounds appropriate also given that he is under hospice care.  He has a brother that typically stays with him but is currently out of town.  I asked him if he is able to care for himself at home.  He says yes and that he has friends that can help him out as well.  He declines admission.  Care turned over to Dr. Vanita Panda pending reevaluation after meds.  Anticipate discharge.    Final Clinical Impression(s) / ED Diagnoses Final diagnoses:  None    Rx / DC Orders ED Discharge Orders     None        Malvin Johns, MD 06/10/21 1337

## 2021-06-10 NOTE — ED Notes (Signed)
Attempted to call family in order to find out door code for patient's residence. Brother is sending calls directly to voicemail and there is no voicemail for the home phone number listed. Patient's cousin does now know any information.

## 2021-06-10 NOTE — ED Triage Notes (Signed)
Pt bib [facility] from home d/t shob.  Pt has been out of O2 x 2 days.  Initial SpO2 64% on RA.  EMS placed pt on 4L O2 w/ improvement in O2 sat to 93%.  Pt has also been out of his BP medication.

## 2021-06-10 NOTE — ED Provider Notes (Signed)
Emergency Medicine Provider Triage Evaluation Note  Robert Chaney , a 70 y.o. male  was evaluated in triage.  Pt complains of sob. On hospice lung cancer. Supposed to be on 4 L [place] at all time. [place] broke , off of 02 x2 days. + swelling, inability to walk, 02 65% on ems arrival. Did try to contact hospice, they did not return his call.  Review of Systems  Positive: sob Negative: Fever  Physical Exam  There were no vitals taken for this visit. Gen:   Awake, no distress   Resp:  Normal effort  MSK:   Moves extremities without difficulty  Other:    Medical Decision Making  Medically screening exam initiated at 11:19 AM.  Appropriate orders placed.  [name] was informed that the remainder of the evaluation will be completed by another provider, this initial triage assessment does not replace that evaluation, and the importance of remaining in the ED until their evaluation is complete.  02 now <90% No meds for the past 2 days.   Margarita Mail, PA-C 06/10/21 1125    Malvin Johns, MD 06/10/21 1212

## 2021-06-10 NOTE — ED Notes (Addendum)
Spoke with on-call hospice nurse. Hospice is unable to stay with patient over night. Patient's brother has gone back to [place] and is no longer caring for him. Patient is unable to care for himself and is not safe to go back to the home by himself. Per hospice, contacting patient's primary nurse and hospital liaison in order to facilitate transfer to a respite. MD and charge nurse aware.

## 2021-06-10 NOTE — ED Notes (Signed)
Contacted hospice, waiting for call back from on-call nurse

## 2021-06-11 ENCOUNTER — Telehealth: Payer: Self-pay

## 2021-06-11 LAB — CBG MONITORING, ED
Glucose-Capillary: 145 mg/dL — ABNORMAL HIGH (ref 70–99)
Glucose-Capillary: 267 mg/dL — ABNORMAL HIGH (ref 70–99)
Glucose-Capillary: 70 mg/dL (ref 70–99)
Glucose-Capillary: 92 mg/dL (ref 70–99)

## 2021-06-11 LAB — RESP PANEL BY RT-PCR (FLU A&B, COVID) ARPGX2
Influenza A by PCR: NEGATIVE
Influenza B by PCR: NEGATIVE
SARS Coronavirus 2 by RT PCR: NEGATIVE

## 2021-06-11 MED ORDER — OXYCODONE HCL 5 MG PO TABS: 10.0000 mg | ORAL_TABLET | ORAL | Status: DC

## 2021-06-11 NOTE — ED Notes (Signed)
PTAR called for update.

## 2021-06-11 NOTE — ED Notes (Signed)
Breakfast tray given. °

## 2021-06-11 NOTE — ED Notes (Signed)
Report called to [place] at [facility], phone number [phone]

## 2021-06-11 NOTE — ED Notes (Signed)
Pt in bed with eyes closed, pt arouses easily, pt sugar is 70, meal tray given, pt states that he doesn't want to eat at this time, meds given with one juice.

## 2021-06-11 NOTE — ED Notes (Addendum)
Pt in bed, pt emitting a snoring like noise, pt arouses easily to verbal stim pt reports a decrease in his chest pain, states that is pain is a 6/10

## 2021-06-11 NOTE — ED Notes (Signed)
Pt sitting up in bed eating lunch, pt c/o chest pain 6/10, oxycodone 10mg  prn given for pain, pt skin is warm and dry, md notified that pt is now c/o chest pain.

## 2021-06-11 NOTE — Progress Notes (Addendum)
Manufacturing engineer [facility]) Hospital Liaison Note  Received request for interest in [facility] for symptom management from [facility] team. Visited patient at bedside and spoke with him to confirm interest and explain services.  Approval for [employer] is determined by [facility] MD. Once [facility] MD has determined [facility] eligibility, [place] will update hospital staff and family.  Addendum Patient has been approved for [facility] for symptom management.   Please send signed DNR form with patient and RN call report to [phone].   Addendum PTAR arranged by MSW. Wait time: TBD  Please do not hesitate to call with any hospice related questions.    Thank you for the opportunity to participate in this patient's care.  Daphene Calamity, MSW [facility] Liaison  [phone]

## 2021-06-11 NOTE — ED Notes (Signed)
PTAR called arrival time about 1 hour

## 2021-06-11 NOTE — Telephone Encounter (Signed)
[name], Mr. Mikles brother, called to inform me that Mr. Mahnke was in the [facility] ED. He stated that the nurse was looking for a code for Mr. Crewe apartment although he has the key. Mr. Johnnette Litter stated that he wasn't in town and that Mr. Burbach has been staying by hisself. I told [name] that I would follow up with the nurse and Mr. Gholson. Lexine Baton, NP notified. All questions answered.

## 2021-06-11 NOTE — Progress Notes (Signed)
[place] [facility])       This patient is a current hospice patient with [facility], admitted 11.24.2022 with a terminal diagnosis of malignant neoplasm of the rectum.   [facility] notified by ED TOC that patient was in ED and in need of respite care. Hospice SW to follow up.   Please don't hesitate to call with any Hospice related questions or concerns.    Thank you for the opportunity to participate in this patient's care. Jhonnie Garner, Therapist, sports, BSN, [facility] Liaison [phone]

## 2021-06-11 NOTE — ED Notes (Signed)
Dr Sedonia Small aware of patient's blood pressure and is ok with waiting until the next scheduled dose of antihypertensive meds.

## 2021-06-24 NOTE — Progress Notes (Signed)
° °                                                                                                                                                          °  Patient Name: Robert Chaney MRN: [id number] DOB: 1950/10/04 Referring Physician: [name] (Profile Not Attached) Date of Service: 05/01/2021 [facility], [place]                                                        End Of Treatment Note  Diagnoses: C20-Malignant neoplasm of rectum  Cancer Staging:  Cancer Staging  Rectal adenocarcinoma [facility]) Staging form: Colon and Rectum, AJCC 8th Edition - Clinical stage from 04/10/2021: Stage IIA (cT3, cN0, cM0) - Signed by Eppie Gibson, MD on 04/11/2021 Total positive nodes: 0  Intent: Palliative  Radiation Treatment Dates: 04/12/2021 through 05/01/2021 Site Technique Total Dose (Gy) Dose per Fx (Gy) Completed Fx Beam Energies  Rectum: Rectum_ 3D 30/30 3 10/10 10X, 15X   Narrative: The patient tolerated radiation therapy relatively well with symptom improvement.   Plan: The patient will follow-up with radiation oncology in 70mo.  -----------------------------------  Eppie Gibson, MD

## 2021-06-24 DEATH — deceased

## 2021-07-20 ENCOUNTER — Ambulatory Visit: Payer: PPO | Admitting: Internal Medicine

## 2021-12-04 IMAGING — DX DG CHEST 2V
2 series · 2 of 2 positions shown · non-contrast
Comparison: Chest x-ray 02/12/2021. CT chest 01/30/2021.

CLINICAL DATA: Suspected sepsis.

EXAM:
CHEST - 2 VIEW

[chest lat]
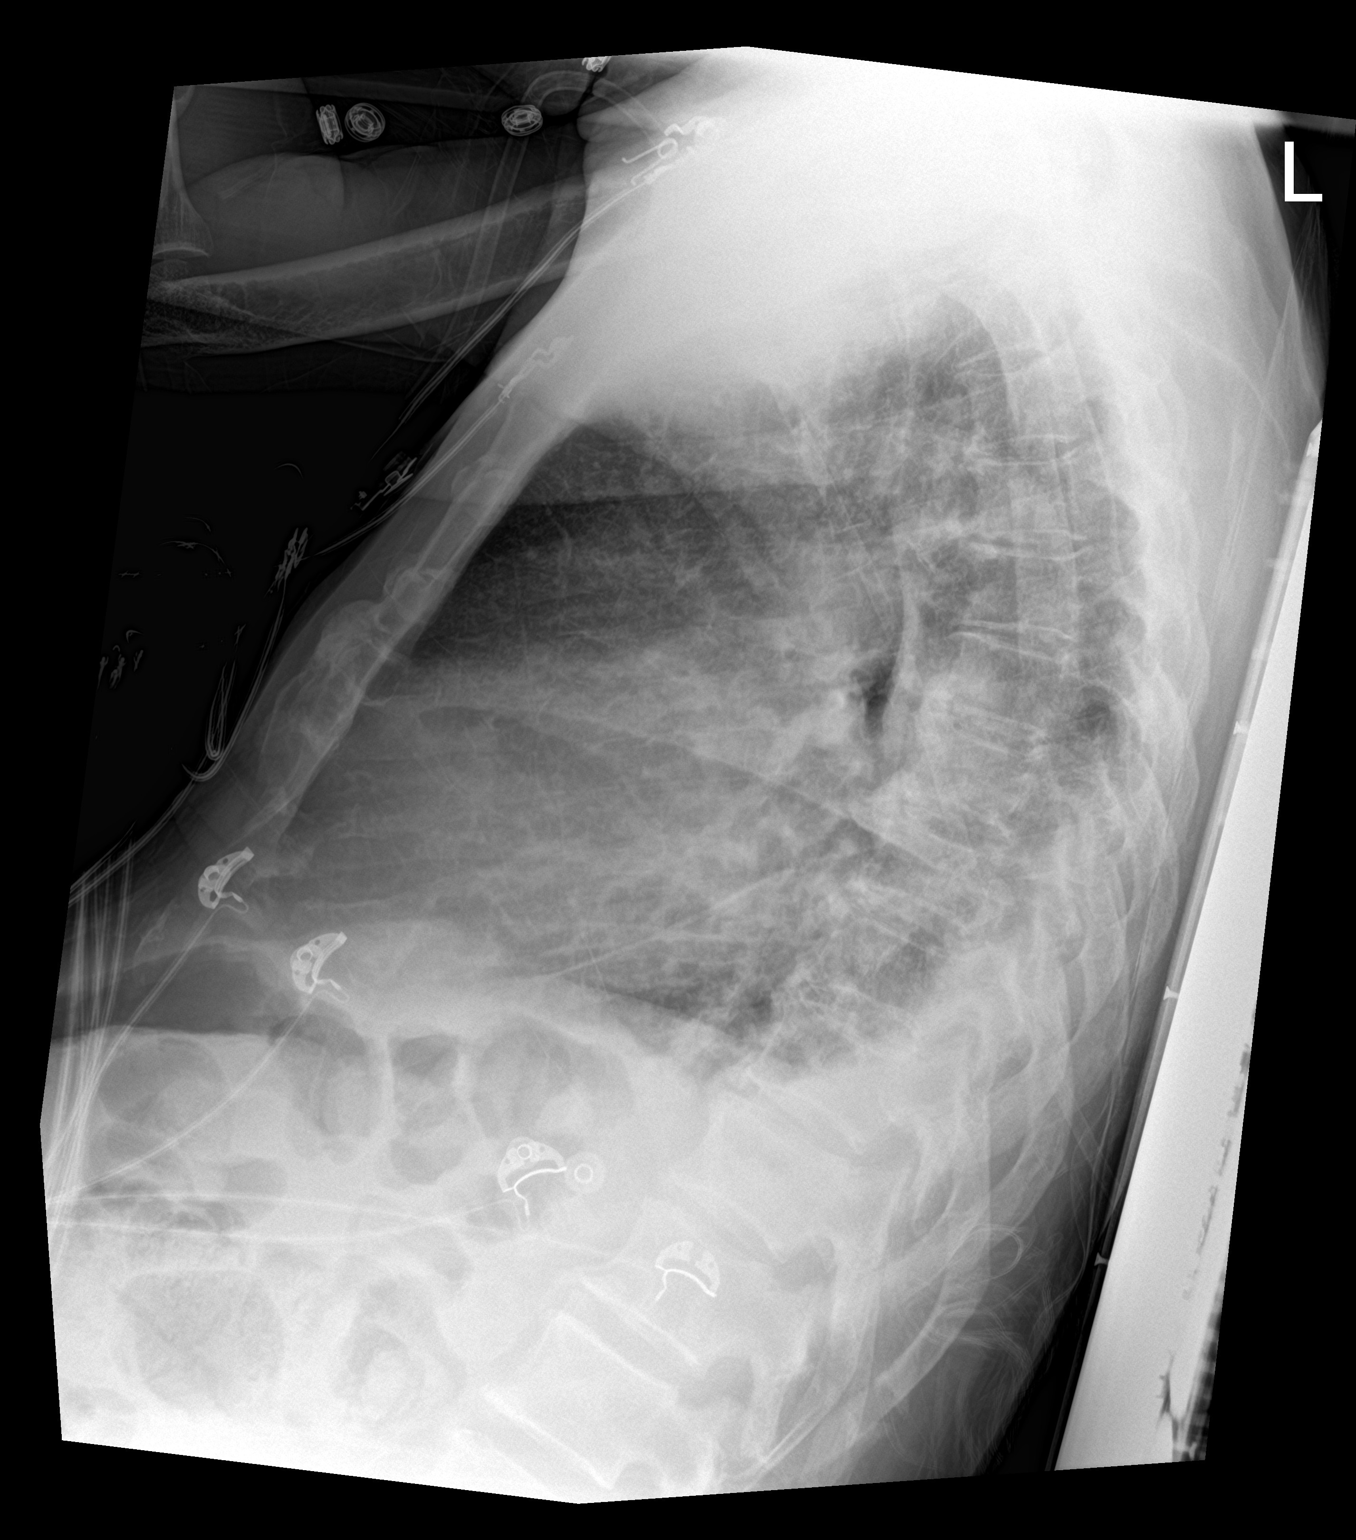

[chest ap]
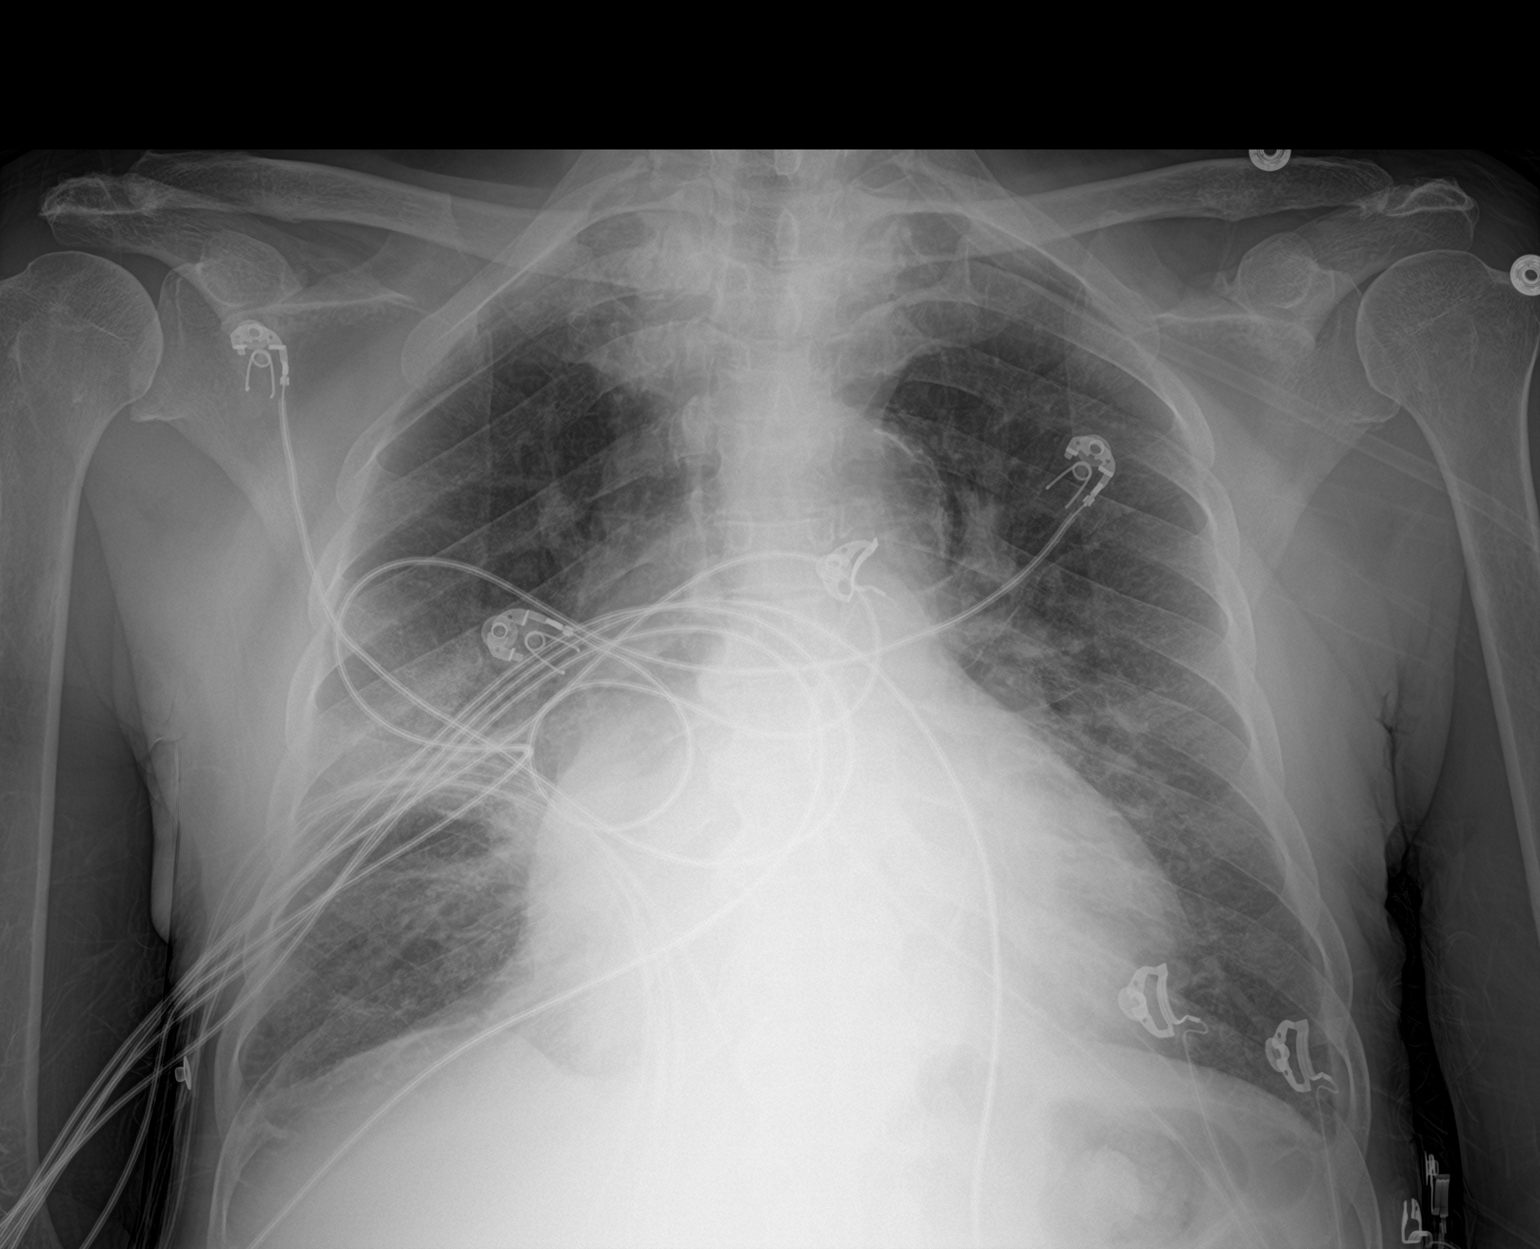

[2 of 2 positions shown; findings below may reference images not displayed]

FINDINGS: Heart is enlarged, unchanged. The aorta is tortuous and contains
atherosclerotic calcifications. Right mid lung airspace disease is
again noted similar to the prior examination. There are stable small
bilateral pleural effusions. There are increased central
interstitial markings bilaterally. There is no evidence for
pneumothorax. No acute fractures are seen. Degenerative changes
affect the spine.
IMPRESSION: 1. Stable right mid lung airspace disease worrisome for pneumonia.
2. Stable small bilateral pleural effusions.
3. Cardiomegaly with central pulmonary vascular congestion.
